# Patient Record
Sex: Female | Born: 1945
Health system: Southern US, Community
[De-identification: ages and names within clinical notes are randomized; demographics above are authoritative.]

## PROBLEM LIST (undated history)

## (undated) DIAGNOSIS — R002 Palpitations: Secondary | ICD-10-CM

## (undated) DIAGNOSIS — M1990 Unspecified osteoarthritis, unspecified site: Secondary | ICD-10-CM

## (undated) DIAGNOSIS — N289 Disorder of kidney and ureter, unspecified: Secondary | ICD-10-CM

## (undated) DIAGNOSIS — G2401 Drug induced subacute dyskinesia: Secondary | ICD-10-CM

## (undated) DIAGNOSIS — R0600 Dyspnea, unspecified: Secondary | ICD-10-CM

## (undated) DIAGNOSIS — F419 Anxiety disorder, unspecified: Secondary | ICD-10-CM

## (undated) DIAGNOSIS — F329 Major depressive disorder, single episode, unspecified: Secondary | ICD-10-CM

## (undated) DIAGNOSIS — F319 Bipolar disorder, unspecified: Secondary | ICD-10-CM

## (undated) DIAGNOSIS — J309 Allergic rhinitis, unspecified: Secondary | ICD-10-CM

## (undated) DIAGNOSIS — D649 Anemia, unspecified: Secondary | ICD-10-CM

## (undated) DIAGNOSIS — G4700 Insomnia, unspecified: Secondary | ICD-10-CM

## (undated) DIAGNOSIS — E669 Obesity, unspecified: Secondary | ICD-10-CM

## (undated) DIAGNOSIS — R251 Tremor, unspecified: Secondary | ICD-10-CM

## (undated) DIAGNOSIS — C801 Malignant (primary) neoplasm, unspecified: Secondary | ICD-10-CM

## (undated) DIAGNOSIS — R42 Dizziness and giddiness: Secondary | ICD-10-CM

## (undated) DIAGNOSIS — E785 Hyperlipidemia, unspecified: Secondary | ICD-10-CM

## (undated) DIAGNOSIS — K5730 Diverticulosis of large intestine without perforation or abscess without bleeding: Secondary | ICD-10-CM

## (undated) DIAGNOSIS — F32A Depression, unspecified: Secondary | ICD-10-CM

## (undated) DIAGNOSIS — I10 Essential (primary) hypertension: Secondary | ICD-10-CM

## (undated) DIAGNOSIS — H9192 Unspecified hearing loss, left ear: Secondary | ICD-10-CM

## (undated) HISTORY — DX: Anemia, unspecified: D64.9

## (undated) HISTORY — DX: Tremor, unspecified: R25.1

## (undated) HISTORY — DX: Diverticulosis of large intestine without perforation or abscess without bleeding: K57.30

## (undated) HISTORY — DX: Drug induced subacute dyskinesia: G24.01

## (undated) HISTORY — PX: TOTAL ABDOMINAL HYSTERECTOMY: SHX209

## (undated) HISTORY — DX: Insomnia, unspecified: G47.00

## (undated) HISTORY — PX: DILATION AND CURETTAGE OF UTERUS: SHX78

## (undated) SURGERY — Surgical Case
Anesthesia: *Unknown

---
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
## 1998-04-24 ENCOUNTER — Other Ambulatory Visit: Admission: RE | Admit: 1998-04-24 | Discharge: 1998-04-24 | Payer: Self-pay | Admitting: Obstetrics & Gynecology

## 2000-02-26 ENCOUNTER — Other Ambulatory Visit: Admission: RE | Admit: 2000-02-26 | Discharge: 2000-02-26 | Payer: Self-pay | Admitting: Obstetrics and Gynecology

## 2000-11-07 ENCOUNTER — Encounter: Payer: Self-pay | Admitting: Gynecology

## 2000-11-09 ENCOUNTER — Encounter (INDEPENDENT_AMBULATORY_CARE_PROVIDER_SITE_OTHER): Payer: Self-pay | Admitting: Specialist

## 2000-11-09 ENCOUNTER — Ambulatory Visit (HOSPITAL_COMMUNITY): Admission: RE | Admit: 2000-11-09 | Discharge: 2000-11-09 | Payer: Self-pay | Admitting: Gynecology

## 2001-08-14 ENCOUNTER — Encounter: Payer: Self-pay | Admitting: Emergency Medicine

## 2001-08-14 ENCOUNTER — Emergency Department (HOSPITAL_COMMUNITY): Admission: EM | Admit: 2001-08-14 | Discharge: 2001-08-14 | Payer: Self-pay | Admitting: Emergency Medicine

## 2001-09-18 ENCOUNTER — Encounter: Admission: RE | Admit: 2001-09-18 | Discharge: 2001-09-18 | Payer: Self-pay | Admitting: Internal Medicine

## 2001-09-18 ENCOUNTER — Encounter: Payer: Self-pay | Admitting: Internal Medicine

## 2001-09-21 ENCOUNTER — Encounter: Admission: RE | Admit: 2001-09-21 | Discharge: 2001-09-21 | Payer: Self-pay | Admitting: Internal Medicine

## 2001-09-21 ENCOUNTER — Encounter: Payer: Self-pay | Admitting: Internal Medicine

## 2001-09-27 ENCOUNTER — Encounter: Admission: RE | Admit: 2001-09-27 | Discharge: 2001-09-27 | Payer: Self-pay | Admitting: Internal Medicine

## 2001-09-27 ENCOUNTER — Encounter: Payer: Self-pay | Admitting: Internal Medicine

## 2002-12-11 ENCOUNTER — Encounter: Admission: RE | Admit: 2002-12-11 | Discharge: 2002-12-11 | Payer: Self-pay | Admitting: Internal Medicine

## 2002-12-11 ENCOUNTER — Encounter: Payer: Self-pay | Admitting: Internal Medicine

## 2006-09-11 ENCOUNTER — Emergency Department (HOSPITAL_COMMUNITY): Admission: EM | Admit: 2006-09-11 | Discharge: 2006-09-11 | Payer: Self-pay | Admitting: Emergency Medicine

## 2006-10-13 ENCOUNTER — Encounter: Admission: RE | Admit: 2006-10-13 | Discharge: 2006-11-10 | Payer: Self-pay | Admitting: Orthopedic Surgery

## 2007-04-27 LAB — HM DEXA SCAN

## 2010-10-08 ENCOUNTER — Ambulatory Visit: Payer: Self-pay | Admitting: Internal Medicine

## 2010-10-08 DIAGNOSIS — I129 Hypertensive chronic kidney disease with stage 1 through stage 4 chronic kidney disease, or unspecified chronic kidney disease: Secondary | ICD-10-CM | POA: Insufficient documentation

## 2010-10-08 DIAGNOSIS — I10 Essential (primary) hypertension: Secondary | ICD-10-CM | POA: Insufficient documentation

## 2010-10-08 DIAGNOSIS — E7800 Pure hypercholesterolemia, unspecified: Secondary | ICD-10-CM | POA: Insufficient documentation

## 2010-10-08 DIAGNOSIS — F329 Major depressive disorder, single episode, unspecified: Secondary | ICD-10-CM | POA: Insufficient documentation

## 2010-10-08 DIAGNOSIS — F32A Depression, unspecified: Secondary | ICD-10-CM | POA: Insufficient documentation

## 2010-10-08 DIAGNOSIS — J309 Allergic rhinitis, unspecified: Secondary | ICD-10-CM | POA: Insufficient documentation

## 2010-11-10 ENCOUNTER — Encounter (INDEPENDENT_AMBULATORY_CARE_PROVIDER_SITE_OTHER): Payer: Self-pay | Admitting: Internal Medicine

## 2010-11-10 ENCOUNTER — Ambulatory Visit: Admit: 2010-11-10 | Payer: Self-pay | Admitting: Internal Medicine

## 2010-11-10 ENCOUNTER — Encounter: Payer: Self-pay | Admitting: Internal Medicine

## 2010-11-10 LAB — CONVERTED CEMR LAB
BUN: 15 mg/dL (ref 6–23)
Basophils Absolute: 0 10*3/uL (ref 0.0–0.1)
Basophils Relative: 0 % (ref 0–1)
CO2: 30 meq/L (ref 19–32)
Creatinine, Ser: 0.87 mg/dL (ref 0.40–1.20)
Eosinophils Absolute: 0.1 10*3/uL (ref 0.0–0.7)
Eosinophils Relative: 1 % (ref 0–5)
HCT: 40.6 % (ref 36.0–46.0)
Hemoglobin: 13.6 g/dL (ref 12.0–15.0)
LDL Cholesterol: 143 mg/dL — ABNORMAL HIGH (ref 0–99)
Lymphs Abs: 2.2 10*3/uL (ref 0.7–4.0)
MCV: 87.5 fL (ref 78.0–100.0)
Monocytes Absolute: 0.6 10*3/uL (ref 0.1–1.0)
Potassium: 3.4 meq/L — ABNORMAL LOW (ref 3.5–5.3)
RBC: 4.64 M/uL (ref 3.87–5.11)
Sodium: 140 meq/L (ref 135–145)
Total CHOL/HDL Ratio: 3.8
Triglycerides: 81 mg/dL (ref <150)
WBC: 7.1 10*3/uL (ref 4.0–10.5)

## 2010-11-12 NOTE — Assessment & Plan Note (Signed)
Summary: NEW PATIENT TO ESTAB//KT   Vital Signs:  Patient profile:   65 year old female Height:      66 inches Weight:      226 pounds BMI:     36.61 Temp:     97.3 degrees F oral Pulse rate:   88 / minute Pulse rhythm:   regular Resp:     18 per minute BP sitting:   170 / 100  (left arm) Cuff size:   regular  Vitals Entered By: [place] Shannon (October 08, 2010 2:00 PM) CC: NP...[name] pt has been off of meds 6 months  pt says her nerves are shocked and she would like something to help her get through... Is Patient Diabetic? No Pain Assessment Patient in pain? no       Does patient need assistance? Functional Status Self care Ambulation Normal   CC:  NP...[name] pt has been off of meds 6 months  pt says her nerves are shocked and she would like something to help her get through....  History of Present Illness: 65 yo female here to establish.  Lost job about 1 year ago and has not been able to afford medication since.  1.  Hypertension:  Took Amlodipine 10 mg and HCTZ 12.5 mg.  BP was controlled on these meds and tolerated well per pt.  Htn diagnosed about 5-6 years ago.    2.  Seasonal Allergies:  does not need Loratadine currently, but this worked fine for her previously.  3.  Hypercholesterolemia:  Cannot recall what she took for this in past.  4.  Depression: was on medication for this.  Feels she is still depressed.  Became a problem when lost job and struggled to pay for things.  Little to no motivation.  Cries a lot.  No suicidal ideation.  Does look forward to watching grandchildren.  MOther of grandchildren has pancreatic cancer as well.    Allergies (verified): No Known Drug Allergies  Past History:  Past Medical History: ALLERGIC RHINITIS (ICD-477.9) HYPERCHOLESTEROLEMIA (ICD-272.0) HYPERTENSION (ICD-401.9) DEPRESSION (ICD-311)    Past Surgical History: 1.  2010:  Left breast biopsy--benign  Family History: Mother, died 59:  Kidney failure,  hypertension Father, died 25:  Unknown cancer--"around his heart"  was a smoker, but pt. not sure if lung cancer  2 Brothers:  1 died age 57:  hypertension complications.  1 living with Hypertension 1 Sister, 42:  Anemia 1 Son, 36:  Depression  Social History: Divorced Not sexually active. 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 Lungs are clear to auscultation, no crackles or wheezes. Heart:  Normal rate and regular rhythm. S1 and S2 normal without gallop, murmur, click, rub or other extra sounds.  Radial pulses normal and equal   Complete Medication List: 1)  Amlodipine Besylate 10 Mg Tabs (Amlodipine besylate) .[name].. 1 tab by mouth daily 2)  Hydrochlorothiazide 12.5 Mg Caps (Hydrochlorothiazide) .[name].. 1 cap by mouth daily in morning 3)  Fluoxetine Hcl 10 Mg Tabs (Fluoxetine hcl) .[name].. 1 tab by mouth daily  Patient Instructions: 1)  Follow up with Dr. Amil Amen in 1 month --depression and bp-come fasting for labs then as well, but take morning meds with water. Prescriptions: FLUOXETINE HCL 10 MG TABS (FLUOXETINE  HCL) 1 tab by mouth daily  #30 x 2   Entered and Authorized by:   Mack Hook MD   Signed by:   Mack Hook MD on 10/08/2010   Method used:   Electronically to        [facility] [phone]* (retail)       [place], [place]  [address]       Ph: [phone]       Fax: [phone]   RxID:   [id number] HYDROCHLOROTHIAZIDE 12.5 MG CAPS (HYDROCHLOROTHIAZIDE) 1 cap by mouth daily in morning  #30 x 11   Entered and Authorized by:   Mack Hook MD   Signed by:   Mack Hook MD on 10/08/2010   Method used:   Electronically to        [facility] [phone]* (retail)       [place], [place]  [address]       Ph: [phone]       Fax: [phone]   RxID:   [id number] AMLODIPINE BESYLATE 10 MG TABS (AMLODIPINE BESYLATE) 1 tab by mouth daily  #30 x 11   Entered and Authorized by:   Mack Hook MD   Signed by:   Mack Hook MD on 10/08/2010   Method used:   Electronically to        [facility] [phone]* (retail)       [address]       [place], [place]  [address]       Ph: [phone]       Fax: [phone]   RxID:   [id number]    Orders Added: 1)  New Patient Level III [id number]

## 2010-11-23 ENCOUNTER — Encounter (INDEPENDENT_AMBULATORY_CARE_PROVIDER_SITE_OTHER): Payer: Self-pay | Admitting: Internal Medicine

## 2010-11-24 ENCOUNTER — Encounter (INDEPENDENT_AMBULATORY_CARE_PROVIDER_SITE_OTHER): Payer: Self-pay | Admitting: Internal Medicine

## 2010-11-24 ENCOUNTER — Encounter: Payer: Self-pay | Admitting: Internal Medicine

## 2010-12-02 NOTE — Assessment & Plan Note (Signed)
Summary: 1 month f/u for depression/fasting   Vital Signs:  Patient profile:   65 year old female Menstrual status:  postmenopausal Weight:      219.25 pounds Temp:     97.7 degrees F oral Pulse rate:   76 / minute Pulse rhythm:   regular Resp:     16 per minute BP sitting:   154 / 90  (left arm) Cuff size:   regular  Vitals Entered By: Aquilla Solian CMA (November 10, 2010 3:05 PM) CC: here for a f/u on depression and fasting labs. Pt. is fasting.  Is Patient Diabetic? No Pain Assessment Patient in pain? no       Does patient need assistance? Functional Status Self care Ambulation Normal     Menstrual Status postmenopausal   CC:  here for a f/u on depression and fasting labs. Pt. is fasting. [name]  History of Present Illness: 1.  Hypertension:  Taking Amlodipine and HCTZ regularly every morning.  Feels much better--lost 7 lbs, she feels was all water weight.  Is exercising once weekly.    2.  Depression:  Still not motivated to do housework at times.  Not crying like she was.  Sleeping fine.  Feels rested in morning when arises.  3.  Hypercholesterolemia:  fasting today  4.  Allergies:  asymptomatic.  Current Medications (verified): 1)  Amlodipine Besylate 10 Mg Tabs (Amlodipine Besylate) .[name].. 1 Tab By Mouth Daily 2)  Hydrochlorothiazide 12.5 Mg Caps (Hydrochlorothiazide) .[name].. 1 Cap By Mouth Daily in Morning 3)  Fluoxetine Hcl 10 Mg Tabs (Fluoxetine Hcl) .[name].. 1 Tab By Mouth Daily  Allergies (verified): No Known Drug Allergies  Physical Exam  General:  NAD, smiling at times Lungs:  Normal respiratory effort, chest expands symmetrically. Lungs are clear to auscultation, no crackles or wheezes. Heart:  Normal rate and regular rhythm. S1 and S2 normal without gallop, murmur, click, rub or other extra sounds.  Radial pulses normal and equal Extremities:  No edema   Impression & Recommendations:  Problem # 1:  HYPERTENSION (ICD-401.9) Improved, but not at  goal--increase to 25 mg of HCTZ Her updated medication list for this problem includes:    Amlodipine Besylate 10 Mg Tabs (Amlodipine besylate) .[name]... 1 tab by mouth daily    Hydrochlorothiazide 25 Mg Tabs (Hydrochlorothiazide) .[name]... 1 tab by mouth daily for blood pressure  Orders: T-CBC w/Diff [id number]) Urinalysis-dipstick only (Medicare patient) [id number])  Problem # 2:  HYPERCHOLESTEROLEMIA (ICD-272.0)  Orders: T-Lipid Profile [id number])  Problem # 3:  DEPRESSION (ICD-311) Improved, but not where she would like to be yet--give it 2 more weeks, if no improvement--increase to 20 mg Her updated medication list for this problem includes:    Fluoxetine Hcl 10 Mg Tabs (Fluoxetine hcl) .[name]... 1 tab by mouth daily  Complete Medication List: 1)  Amlodipine Besylate 10 Mg Tabs (Amlodipine besylate) .[name].. 1 tab by mouth daily 2)  Hydrochlorothiazide 25 Mg Tabs (Hydrochlorothiazide) .[name].. 1 tab by mouth daily for blood pressure 3)  Fluoxetine Hcl 10 Mg Tabs (Fluoxetine hcl) .[name].. 1 tab by mouth daily  Other Orders: T-Comprehensive Metabolic Panel ([id number])  Patient Instructions: 1)  nurse visit for bp check in 2 weeks--see how she is doing with depression then--if still not at goal, will increase to 20 mg of Fluoxetine 2)  Follow up with Dr. Amil Amen in 4 months --htn, depression Prescriptions: FLUOXETINE HCL 10 MG TABS (FLUOXETINE HCL) 1 tab by mouth daily  #30 x 4   Entered and Authorized  by:   Mack Hook MD   Signed by:   Mack Hook MD on 11/10/2010   Method used:   Electronically to        [facility] [phone]* (retail)       [place], [place]  [address]       Ph: [phone]       Fax: [phone]   RxID:   [id number] HYDROCHLOROTHIAZIDE 25 MG TABS (HYDROCHLOROTHIAZIDE) 1 tab by mouth daily for blood pressure  #30 x 11   Entered and Authorized by:   Mack Hook MD   Signed by:   Mack Hook MD on 11/10/2010    Method used:   Electronically to        [facility] [phone]* (retail)       [address]       [place], [place]  [address]       Ph: [phone]       Fax: [phone]   RxID:   [id number]    Orders Added: 1)  T-Lipid Profile [id number] 2)  T-Comprehensive Metabolic Panel [id number] 3)  T-CBC w/Diff [id number] 4)  Urinalysis-dipstick only (Medicare patient) [id number] 5)  Est. Patient Level III [id number]   Not Administered:    Influenza Vaccine not given due to: declined

## 2010-12-02 NOTE — Letter (Signed)
Summary: Lipid Letter  [facility]  [address] [place], [place] [address]   Phone: [phone]  Fax: [phone]    11/23/2010  Judy [name] [address] [place], [place]  [address]  Dear Ms. Colla:  We have carefully reviewed your last lipid profile from 11/10/2010 and the results are noted below with a summary of recommendations for lipid management.    Cholesterol:       216     Goal: <200   HDL "good" Cholesterol:   57     Goal: >45   LDL "bad" Cholesterol:   143     Goal: <100   Triglycerides:       81     Goal: <150    Your total cholesterol and LDL or "bad" cholesterol are a bit high.  If you can increase your vegetable intake (3-5 servings daily) and exercise more, that will help.  Your blood sugar was slightly high as well, so would encourage you to decrease the sugar/carbohydrates in your diet as well as above.  We should recheck your sugar and cholesterol sometime in next 6 months.  If you would like to meet with a nutritionist to get more information on how to change your diet, let us know and we will set up.    TLC Diet (Therapeutic Lifestyle Change): Saturated Fats & Transfatty acids should be kept < 7% of total calories ***Reduce Saturated Fats Polyunstaurated Fat can be up to 10% of total calories Monounsaturated Fat Fat can be up to 20% of total calories Total Fat should be no greater than 25-35% of total calories Carbohydrates should be 50-60% of total calories Protein should be approximately 15% of total calories Fiber should be at least 20-30 grams a day ***Increased fiber may help lower LDL Total Cholesterol should be < 200mg /day Consider adding plant stanol/sterols to diet (example: Benacol spread) ***A higher intake of unsaturated fat may reduce Triglycerides and Increase HDL    Adjunctive Measures (may lower LIPIDS and reduce risk of Heart Attack) include: Aerobic Exercise (20-30 minutes 3-4 times a week) Limit  Alcohol Consumption Weight Reduction Aspirin 75-81 mg a day by mouth (if not allergic or contraindicated) Dietary Fiber 20-30 grams a day by mouth     Current Medications: 1)    Amlodipine Besylate 10 Mg Tabs (Amlodipine besylate) .[name].. 1 tab by mouth daily 2)    Hydrochlorothiazide 25 Mg Tabs (Hydrochlorothiazide) .[name].. 1 tab by mouth daily for blood pressure 3)    Fluoxetine Hcl 10 Mg Tabs (Fluoxetine hcl) .[name].. 1 tab by mouth daily  If you have any questions, please call. We appreciate being able to work with you.   Sincerely,    [facility]

## 2010-12-02 NOTE — Assessment & Plan Note (Signed)
Summary: BP recheck and med assessment  Nurse Visit   Vital Signs:  Patient profile:   65 year old female Menstrual status:  postmenopausal Weight:      219.7 pounds Temp:     96.9 degrees F oral Pulse rate:   64 / minute Pulse rhythm:   regular Resp:     16 per minute BP sitting:   120 / 74  (left arm) Cuff size:   regular  Vitals Entered By: Sherian Maroon RN (November 24, 2010 2:41 PM) CC: BP recheck and assess effectiveness of meds Is Patient Diabetic? No Pain Assessment Patient in pain? no       Does patient need assistance? Functional Status Self care Ambulation Normal   CC:  BP recheck and assess effectiveness of meds.  History of Present Illness: 11/10/10  BP 154/90  P76.  BP was better but not at goal.  Increased HCTZ.  Here to recheck BP and assess effectiveness of fluoxetine for depression.  Taking medications daily, no missed doses.     Patient Instructions: 1)  Reviewed with Dr. Amil Amen 2)  Your blood pressure is greatly improved. 3)  Continue medications for blood pressure as ordered. 4)  Take fluoxetine 20 mg. one tab by mouth daily. 5)  Call to schedule follow-up appointment with provider for two months.  We will be in the [employer] office at your next visit. 6)  Call if anything changes or if you have any questions.   Review of Systems Psych:  Still having a struggle completing ADLs, housework, some difficulty getting out of the house.  Says "she's getting there" but not where she'd like to be.  Denies any symptoms of anxiety, anger, irritability.  Denies thoughts of suicide or violence.  No episodes of tearfulness or panic attacks..   Impression & Recommendations:  Problem # 1:  HYPERTENSION (ICD-401.9) BP good Continue meds as ordered F/u with provider in two months   Her updated medication list for this problem includes:    Amlodipine Besylate 10 Mg Tabs (Amlodipine besylate) .[name]... 1 tab by mouth daily    Hydrochlorothiazide 25 Mg Tabs  (Hydrochlorothiazide) .[name]... 1 tab by mouth daily for blood pressure  Problem # 2:  DEPRESSION (ICD-311) Doing better, still having trouble with daily activities, getting out Fluoxetine increased F/u with provider in two months  Her updated medication list for this problem includes:    Fluoxetine Hcl 20 Mg Tabs (Fluoxetine hcl) .[name]... Take one tab by mouth once daily for mood  Complete Medication List: 1)  Amlodipine Besylate 10 Mg Tabs (Amlodipine besylate) .[name].. 1 tab by mouth daily 2)  Hydrochlorothiazide 25 Mg Tabs (Hydrochlorothiazide) .[name].. 1 tab by mouth daily for blood pressure 3)  Fluoxetine Hcl 20 Mg Tabs (Fluoxetine hcl) .... Take one tab by mouth once daily for mood   Physical Exam  General:  alert, well-developed, well-nourished, and well-hydrated.   Psych:  Oriented X3, good eye contact, and not anxious appearing.  Quiet demeanor, not tearful.   Allergies: No Known Drug Allergies  Orders Added: 1)  Est. Patient Level II [id number]

## 2010-12-21 ENCOUNTER — Telehealth (INDEPENDENT_AMBULATORY_CARE_PROVIDER_SITE_OTHER): Payer: Self-pay | Admitting: Internal Medicine

## 2010-12-29 NOTE — Progress Notes (Signed)
Summary: Dental  needs  Phone Note Call from Patient Call back at [phone]   Summary of Call: cell phone home # no longer avail. Pt needs dental assist/ds Initial call taken by: Elgie Collard,  December 21, 2010 4:11 PM  Follow-up for Phone Call        Home # disconnected.  Left message on voicemail for pt. to return call.  Sherian Maroon RN  December 22, 2010 5:15 PM  Left message on voicemail for pt. to return call.  Sherian Maroon RN  December 23, 2010 1:07 PM  Wanted to get a referral for routine dental care.  Advised that currently only active emergent/urgent care is being referred, that she will have to locate an outside provider.  Verbalized understanding and agreement. Follow-up by: Sherian Maroon RN,  December 23, 2010 2:21 PM

## 2011-02-26 NOTE — Op Note (Signed)
[facility]  Patient:    Judy [name], Judy [name]                      MRN: [id number] Proc. Date: 11/09/00 Adm. Date:  [id number] Disc. Date: [id number] Attending:  Worthy Flank                           Operative Report  REDICTATION:  Originally dictated on November 09, 2000.  PREOPERATIVE DIAGNOSES: 1. Postmenopausal bleeding. 2. Cervical stenosis.  POSTOPERATIVE DIAGNOSES: 1. Postmenopausal bleeding. 2. Cervical stenosis. 3. Uterine fibroids.  OPERATION:  Dilatation and curettage.  SURGEON:  Barbaraann Rondo, M.D.  DESCRIPTION OF PROCEDURE:  Under good anesthesia, the patient was prepped and draped in a sterile manner.  Then revealed the uterus anterior slightly enlarged and irregular.  No masses on the adnexa were palpated.  Cervix was smooth and had a pinpoint os.  The os was probed with a small hemostat and dilated by opening the hemostat.  It was carried on up through the canal until a small opening in the canal could be obtained.  Some old blood drained through the canal once this was done.  The uterus was then sounded to a depth of 4 inches.  Cervix was then dilated up to about 26-French, and then cervical curettage was done, and this sent as a separate specimen.  Endometrial curettage was done, and a small to moderate amount of tissue was obtained. The endometrial cavity was somewhat irregular.  The endometrial cavity was wiped with a dry sponge and was clean.  The procedure was then terminated. She was removed to recovery in good condition.  Tissue was sent to pathology. Estimated blood loss was 25 cc; none was replaced. DD:  11/22/00 TD:  11/22/00 Job: [id number]

## 2011-02-26 NOTE — Op Note (Signed)
[facility]  Patient:    Judy [name], Judy [name]                      MRN: [id number] Proc. Date: 11/09/00 Adm. Date:  [id number] Attending:  Worthy Flank                           Operative Report  PREOPERATIVE DIAGNOSES: 1. Postmenopausal bleeding. 2. Cervical stenosis.  POSTOPERATIVE DIAGNOSES: 1. Postmenopausal bleeding. 2. Cervical stenosis.  OPERATION:  D&C.  SURGEON:  Barbaraann Rondo, M.D.  DESCRIPTION OF PROCEDURE:  The patient was placed on the operating table in the modified lithotomy position.  She was sedated.  A speculum was placed in the vagina after examination showed the uterus to be retroflexed and felt a little bit enlarged.  The patient is a little obese and it was tough to outline the uterus.  No adnexal masses could be outlined.  The cervix was grasped with the tenaculum and a paracervical block was performed using 10 cc of 1% lidocaine at the 8 and 4 oclock positions.  Once this was achieved, the canal was probed with a fine dilator and found to be opened carefully.  She was dilated up and then the regular cervical dilators could be used to continue the dilatation up to about a 29-French. The cavity sounded to a depth of almost 5 inches.  The endocervix was curetted and this was sent as a separate specimen.  The cavity was explored for polyp forceps and no polyps were obtained.  The endometrium was scraped with a sharp curet and only a small amount of tissue was obtained.  There was a definite feeling of irregularity on the anterior wall of the uterine cavity.  This was thought to be due to fibroid.  The endometrial cavity was wiped with a dry sponge and the procedure then terminated.  She was removed to recovery in good condition. DD:  11/09/00 TD:  11/09/00 Job: [id number]

## 2011-04-25 ENCOUNTER — Emergency Department (HOSPITAL_COMMUNITY)
Admission: EM | Admit: 2011-04-25 | Discharge: 2011-04-25 | Disposition: A | Discharge status: Home / Self Care | Payer: Self-pay | Attending: Emergency Medicine | Admitting: Emergency Medicine

## 2011-04-25 ENCOUNTER — Emergency Department (HOSPITAL_COMMUNITY): Payer: Self-pay

## 2011-04-25 DIAGNOSIS — E7800 Pure hypercholesterolemia, unspecified: Secondary | ICD-10-CM | POA: Insufficient documentation

## 2011-04-25 DIAGNOSIS — I498 Other specified cardiac arrhythmias: Secondary | ICD-10-CM | POA: Insufficient documentation

## 2011-04-25 DIAGNOSIS — G9389 Other specified disorders of brain: Secondary | ICD-10-CM | POA: Insufficient documentation

## 2011-04-25 DIAGNOSIS — R110 Nausea: Secondary | ICD-10-CM | POA: Insufficient documentation

## 2011-04-25 DIAGNOSIS — H81399 Other peripheral vertigo, unspecified ear: Secondary | ICD-10-CM | POA: Insufficient documentation

## 2011-04-25 DIAGNOSIS — I10 Essential (primary) hypertension: Secondary | ICD-10-CM | POA: Insufficient documentation

## 2011-04-25 LAB — DIFFERENTIAL
Basophils Absolute: 0 10*3/uL (ref 0.0–0.1)
Basophils Relative: 0 % (ref 0–1)
Eosinophils Absolute: 0.2 10*3/uL (ref 0.0–0.7)
Lymphocytes Relative: 30 % (ref 12–46)
Lymphs Abs: 2.1 10*3/uL (ref 0.7–4.0)
Neutrophils Relative %: 58 % (ref 43–77)

## 2011-04-25 LAB — CBC
HCT: 35.9 % — ABNORMAL LOW (ref 36.0–46.0)
MCH: 29.8 pg (ref 26.0–34.0)
MCHC: 34 g/dL (ref 30.0–36.0)
MCV: 87.6 fL (ref 78.0–100.0)
Platelets: 258 10*3/uL (ref 150–400)
RDW: 14.2 % (ref 11.5–15.5)

## 2011-04-25 LAB — POCT I-STAT, CHEM 8
BUN: 24 mg/dL — ABNORMAL HIGH (ref 6–23)
Chloride: 104 mEq/L (ref 96–112)
Creatinine, Ser: 0.9 mg/dL (ref 0.50–1.10)
Glucose, Bld: 93 mg/dL (ref 70–99)
Hemoglobin: 12.9 g/dL (ref 12.0–15.0)
TCO2: 27 mmol/L (ref 0–100)

## 2011-08-24 ENCOUNTER — Encounter (HOSPITAL_COMMUNITY): Payer: Self-pay | Admitting: Gastroenterology

## 2011-08-25 ENCOUNTER — Encounter (HOSPITAL_COMMUNITY): Payer: Self-pay | Admitting: Gastroenterology

## 2011-08-25 ENCOUNTER — Ambulatory Visit (HOSPITAL_COMMUNITY)
Admission: RE | Admit: 2011-08-25 | Discharge: 2011-08-25 | Disposition: A | Discharge status: Home / Self Care | Payer: Self-pay | Source: Ambulatory Visit | Attending: Gastroenterology | Admitting: Gastroenterology

## 2011-08-25 ENCOUNTER — Encounter (HOSPITAL_COMMUNITY)
Admission: RE | Disposition: A | Discharge status: Home / Self Care | Payer: Self-pay | Source: Ambulatory Visit | Attending: Gastroenterology

## 2011-08-25 DIAGNOSIS — I10 Essential (primary) hypertension: Secondary | ICD-10-CM | POA: Insufficient documentation

## 2011-08-25 DIAGNOSIS — Z79899 Other long term (current) drug therapy: Secondary | ICD-10-CM | POA: Insufficient documentation

## 2011-08-25 DIAGNOSIS — Z1211 Encounter for screening for malignant neoplasm of colon: Secondary | ICD-10-CM | POA: Insufficient documentation

## 2011-08-25 DIAGNOSIS — Z800 Family history of malignant neoplasm of digestive organs: Secondary | ICD-10-CM | POA: Insufficient documentation

## 2011-08-25 DIAGNOSIS — K5730 Diverticulosis of large intestine without perforation or abscess without bleeding: Secondary | ICD-10-CM | POA: Insufficient documentation

## 2011-08-25 DIAGNOSIS — Z7982 Long term (current) use of aspirin: Secondary | ICD-10-CM | POA: Insufficient documentation

## 2011-08-25 HISTORY — DX: Unspecified osteoarthritis, unspecified site: M19.90

## 2011-08-25 HISTORY — DX: Diverticulosis of large intestine without perforation or abscess without bleeding: K57.30

## 2011-08-25 HISTORY — PX: COLONOSCOPY: SHX5424

## 2011-08-25 HISTORY — DX: Essential (primary) hypertension: I10

## 2011-08-25 HISTORY — DX: Hyperlipidemia, unspecified: E78.5

## 2011-08-25 HISTORY — DX: Dizziness and giddiness: R42

## 2011-08-25 SURGERY — COLONOSCOPY
Anesthesia: Moderate Sedation

## 2011-08-25 MED ORDER — MIDAZOLAM HCL 10 MG/2ML IJ SOLN
INTRAMUSCULAR | Status: DC | PRN
  Administered 2011-08-25 (×3): 2 mg via INTRAVENOUS

## 2011-08-25 MED ORDER — FENTANYL CITRATE 0.05 MG/ML IJ SOLN
INTRAMUSCULAR | Status: AC
  Filled 2011-08-25: qty 4

## 2011-08-25 MED ORDER — FENTANYL CITRATE 0.05 MG/ML IJ SOLN
INTRAMUSCULAR | Status: DC | PRN
  Administered 2011-08-25 (×4): 25 ug via INTRAVENOUS

## 2011-08-25 MED ORDER — MIDAZOLAM HCL 10 MG/2ML IJ SOLN
INTRAMUSCULAR | Status: AC
  Filled 2011-08-25: qty 4

## 2011-08-25 MED ORDER — SODIUM CHLORIDE 0.9 % IV SOLN
INTRAVENOUS | Status: DC
  Administered 2011-08-25: 500 mL via INTRAVENOUS
  Administered 2011-08-25: 08:00:00 via INTRAVENOUS

## 2011-08-25 MED ORDER — DIPHENHYDRAMINE HCL 50 MG/ML IJ SOLN
INTRAMUSCULAR | Status: AC
  Filled 2011-08-25: qty 1

## 2011-08-25 NOTE — H&P (Signed)
Patient interval history reviewed.  Patient examined again.  There has been no change from documented H/P dated 08/09/2011 (scanned into chart from our office) except as documented above

## 2011-08-25 NOTE — Brief Op Note (Signed)
  Please see [facility] note dated 08/25/2011.

## 2011-08-26 ENCOUNTER — Encounter (HOSPITAL_COMMUNITY): Payer: Self-pay

## 2011-09-06 ENCOUNTER — Encounter (HOSPITAL_COMMUNITY): Payer: Self-pay | Admitting: Gastroenterology

## 2013-04-05 ENCOUNTER — Encounter: Payer: Self-pay | Admitting: Internal Medicine

## 2013-04-06 ENCOUNTER — Encounter: Payer: Self-pay | Admitting: Family Medicine

## 2013-12-10 ENCOUNTER — Emergency Department (HOSPITAL_COMMUNITY): Payer: Medicare Other

## 2013-12-10 ENCOUNTER — Inpatient Hospital Stay (HOSPITAL_COMMUNITY)
Admission: EM | Admit: 2013-12-10 | Discharge: 2013-12-14 | DRG: 312 | Disposition: A | Discharge status: Skilled Nursing Facility | Payer: Medicare Other | Attending: Internal Medicine | Admitting: Internal Medicine

## 2013-12-10 ENCOUNTER — Encounter (HOSPITAL_COMMUNITY): Payer: Self-pay | Admitting: Emergency Medicine

## 2013-12-10 DIAGNOSIS — F329 Major depressive disorder, single episode, unspecified: Secondary | ICD-10-CM

## 2013-12-10 DIAGNOSIS — Z6832 Body mass index (BMI) 32.0-32.9, adult: Secondary | ICD-10-CM

## 2013-12-10 DIAGNOSIS — H9192 Unspecified hearing loss, left ear: Secondary | ICD-10-CM | POA: Insufficient documentation

## 2013-12-10 DIAGNOSIS — R5381 Other malaise: Secondary | ICD-10-CM | POA: Diagnosis present

## 2013-12-10 DIAGNOSIS — H9190 Unspecified hearing loss, unspecified ear: Secondary | ICD-10-CM | POA: Diagnosis present

## 2013-12-10 DIAGNOSIS — F32A Depression, unspecified: Secondary | ICD-10-CM | POA: Diagnosis present

## 2013-12-10 DIAGNOSIS — R42 Dizziness and giddiness: Secondary | ICD-10-CM | POA: Diagnosis present

## 2013-12-10 DIAGNOSIS — F411 Generalized anxiety disorder: Secondary | ICD-10-CM | POA: Diagnosis present

## 2013-12-10 DIAGNOSIS — F3289 Other specified depressive episodes: Secondary | ICD-10-CM

## 2013-12-10 DIAGNOSIS — E669 Obesity, unspecified: Secondary | ICD-10-CM | POA: Diagnosis present

## 2013-12-10 DIAGNOSIS — S0003XA Contusion of scalp, initial encounter: Secondary | ICD-10-CM | POA: Diagnosis present

## 2013-12-10 DIAGNOSIS — S0083XA Contusion of other part of head, initial encounter: Secondary | ICD-10-CM

## 2013-12-10 DIAGNOSIS — R1310 Dysphagia, unspecified: Secondary | ICD-10-CM | POA: Diagnosis present

## 2013-12-10 DIAGNOSIS — S1093XA Contusion of unspecified part of neck, initial encounter: Secondary | ICD-10-CM

## 2013-12-10 DIAGNOSIS — I519 Heart disease, unspecified: Secondary | ICD-10-CM | POA: Diagnosis present

## 2013-12-10 DIAGNOSIS — I129 Hypertensive chronic kidney disease with stage 1 through stage 4 chronic kidney disease, or unspecified chronic kidney disease: Secondary | ICD-10-CM | POA: Diagnosis present

## 2013-12-10 DIAGNOSIS — R5383 Other fatigue: Secondary | ICD-10-CM

## 2013-12-10 DIAGNOSIS — E785 Hyperlipidemia, unspecified: Secondary | ICD-10-CM | POA: Diagnosis present

## 2013-12-10 DIAGNOSIS — D72829 Elevated white blood cell count, unspecified: Secondary | ICD-10-CM

## 2013-12-10 DIAGNOSIS — J309 Allergic rhinitis, unspecified: Secondary | ICD-10-CM

## 2013-12-10 DIAGNOSIS — W010XXA Fall on same level from slipping, tripping and stumbling without subsequent striking against object, initial encounter: Secondary | ICD-10-CM | POA: Diagnosis present

## 2013-12-10 DIAGNOSIS — I951 Orthostatic hypotension: Principal | ICD-10-CM | POA: Diagnosis present

## 2013-12-10 DIAGNOSIS — J069 Acute upper respiratory infection, unspecified: Secondary | ICD-10-CM

## 2013-12-10 DIAGNOSIS — I10 Essential (primary) hypertension: Secondary | ICD-10-CM | POA: Diagnosis present

## 2013-12-10 DIAGNOSIS — F319 Bipolar disorder, unspecified: Secondary | ICD-10-CM | POA: Diagnosis present

## 2013-12-10 DIAGNOSIS — R55 Syncope and collapse: Secondary | ICD-10-CM | POA: Diagnosis present

## 2013-12-10 DIAGNOSIS — E876 Hypokalemia: Secondary | ICD-10-CM | POA: Diagnosis present

## 2013-12-10 DIAGNOSIS — Y92009 Unspecified place in unspecified non-institutional (private) residence as the place of occurrence of the external cause: Secondary | ICD-10-CM

## 2013-12-10 DIAGNOSIS — E7800 Pure hypercholesterolemia, unspecified: Secondary | ICD-10-CM | POA: Diagnosis present

## 2013-12-10 DIAGNOSIS — M25569 Pain in unspecified knee: Secondary | ICD-10-CM | POA: Diagnosis present

## 2013-12-10 DIAGNOSIS — E860 Dehydration: Secondary | ICD-10-CM | POA: Diagnosis present

## 2013-12-10 DIAGNOSIS — N390 Urinary tract infection, site not specified: Secondary | ICD-10-CM | POA: Diagnosis present

## 2013-12-10 DIAGNOSIS — H5509 Other forms of nystagmus: Secondary | ICD-10-CM | POA: Diagnosis present

## 2013-12-10 HISTORY — DX: Major depressive disorder, single episode, unspecified: F32.9

## 2013-12-10 HISTORY — DX: Bipolar disorder, unspecified: F31.9

## 2013-12-10 HISTORY — DX: Anxiety disorder, unspecified: F41.9

## 2013-12-10 HISTORY — DX: Allergic rhinitis, unspecified: J30.9

## 2013-12-10 HISTORY — DX: Unspecified hearing loss, left ear: H91.92

## 2013-12-10 HISTORY — DX: Obesity, unspecified: E66.9

## 2013-12-10 HISTORY — DX: Depression, unspecified: F32.A

## 2013-12-10 LAB — TROPONIN I

## 2013-12-10 LAB — COMPREHENSIVE METABOLIC PANEL
ALK PHOS: 84 U/L (ref 39–117)
ALT: 14 U/L (ref 0–35)
AST: 20 U/L (ref 0–37)
Albumin: 4 g/dL (ref 3.5–5.2)
BUN: 10 mg/dL (ref 6–23)
CALCIUM: 10.1 mg/dL (ref 8.4–10.5)
CO2: 28 mEq/L (ref 19–32)
Chloride: 98 mEq/L (ref 96–112)
Creatinine, Ser: 0.98 mg/dL (ref 0.50–1.10)
GFR calc non Af Amer: 59 mL/min — ABNORMAL LOW (ref >90)
GFR, EST AFRICAN AMERICAN: 68 mL/min — AB (ref >90)
GLUCOSE: 111 mg/dL — AB (ref 70–99)
Potassium: 3.8 mEq/L (ref 3.7–5.3)
Sodium: 139 mEq/L (ref 137–147)
TOTAL PROTEIN: 7.7 g/dL (ref 6.0–8.3)
Total Bilirubin: 0.4 mg/dL (ref 0.3–1.2)

## 2013-12-10 LAB — URINE MICROSCOPIC-ADD ON

## 2013-12-10 LAB — URINALYSIS, ROUTINE W REFLEX MICROSCOPIC
BILIRUBIN URINE: NEGATIVE
Glucose, UA: NEGATIVE mg/dL
Hgb urine dipstick: NEGATIVE
Ketones, ur: 40 mg/dL — AB
NITRITE: NEGATIVE
PH: 5.5 (ref 5.0–8.0)
Protein, ur: NEGATIVE mg/dL
SPECIFIC GRAVITY, URINE: 1.014 (ref 1.005–1.030)
Urobilinogen, UA: 1 mg/dL (ref 0.0–1.0)

## 2013-12-10 LAB — CBC WITH DIFFERENTIAL/PLATELET
Basophils Absolute: 0 10*3/uL (ref 0.0–0.1)
Basophils Relative: 0 % (ref 0–1)
EOS ABS: 0 10*3/uL (ref 0.0–0.7)
EOS PCT: 0 % (ref 0–5)
HCT: 41.2 % (ref 36.0–46.0)
HEMOGLOBIN: 14 g/dL (ref 12.0–15.0)
LYMPHS ABS: 0.9 10*3/uL (ref 0.7–4.0)
Lymphocytes Relative: 8 % — ABNORMAL LOW (ref 12–46)
MCH: 30 pg (ref 26.0–34.0)
MCHC: 34 g/dL (ref 30.0–36.0)
MCV: 88.4 fL (ref 78.0–100.0)
MONOS PCT: 5 % (ref 3–12)
Monocytes Absolute: 0.6 10*3/uL (ref 0.1–1.0)
Neutro Abs: 9.3 10*3/uL — ABNORMAL HIGH (ref 1.7–7.7)
Neutrophils Relative %: 86 % — ABNORMAL HIGH (ref 43–77)
Platelets: 254 10*3/uL (ref 150–400)
RBC: 4.66 MIL/uL (ref 3.87–5.11)
RDW: 14.3 % (ref 11.5–15.5)
WBC: 10.8 10*3/uL — ABNORMAL HIGH (ref 4.0–10.5)

## 2013-12-10 LAB — I-STAT TROPONIN, ED: TROPONIN I, POC: 0 ng/mL (ref 0.00–0.08)

## 2013-12-10 MED ORDER — ACETAMINOPHEN 650 MG RE SUPP: 650.0000 mg | Freq: Four times a day (QID) | RECTAL | Status: DC | PRN

## 2013-12-10 MED ORDER — DOCUSATE SODIUM 100 MG PO CAPS
100.0000 mg | ORAL_CAPSULE | Freq: Two times a day (BID) | ORAL | Status: DC
  Administered 2013-12-10 – 2013-12-14 (×8): 100 mg via ORAL
  Filled 2013-12-10 (×9): qty 1

## 2013-12-10 MED ORDER — INFLUENZA VAC SPLIT QUAD 0.5 ML IM SUSP
0.5000 mL | INTRAMUSCULAR | Status: AC
  Administered 2013-12-11: 0.5 mL via INTRAMUSCULAR
  Filled 2013-12-10 (×2): qty 0.5

## 2013-12-10 MED ORDER — RISPERIDONE 0.5 MG PO TABS
0.5000 mg | ORAL_TABLET | Freq: Every day | ORAL | Status: DC
  Administered 2013-12-10 – 2013-12-13 (×4): 0.5 mg via ORAL
  Filled 2013-12-10 (×5): qty 1

## 2013-12-10 MED ORDER — ACETAMINOPHEN 325 MG PO TABS: 650.0000 mg | ORAL_TABLET | Freq: Four times a day (QID) | ORAL | Status: DC | PRN

## 2013-12-10 MED ORDER — DIAZEPAM 5 MG PO TABS
5.0000 mg | ORAL_TABLET | Freq: Once | ORAL | Status: AC
  Administered 2013-12-10: 5 mg via ORAL
  Filled 2013-12-10: qty 1

## 2013-12-10 MED ORDER — FLUOXETINE HCL 20 MG PO TABS
40.0000 mg | ORAL_TABLET | Freq: Every day | ORAL | Status: DC
  Administered 2013-12-10 – 2013-12-14 (×5): 40 mg via ORAL
  Filled 2013-12-10 (×5): qty 2

## 2013-12-10 MED ORDER — ONDANSETRON HCL 4 MG/2ML IJ SOLN: 4.0000 mg | Freq: Four times a day (QID) | INTRAMUSCULAR | Status: DC | PRN

## 2013-12-10 MED ORDER — SODIUM CHLORIDE 0.9 % IV SOLN
INTRAVENOUS | Status: DC
  Administered 2013-12-11 – 2013-12-12 (×2): via INTRAVENOUS

## 2013-12-10 MED ORDER — SENNA 8.6 MG PO TABS
1.0000 | ORAL_TABLET | Freq: Two times a day (BID) | ORAL | Status: DC
  Administered 2013-12-10 – 2013-12-14 (×8): 8.6 mg via ORAL
  Filled 2013-12-10 (×8): qty 1

## 2013-12-10 MED ORDER — LOSARTAN POTASSIUM 50 MG PO TABS
100.0000 mg | ORAL_TABLET | Freq: Every day | ORAL | Status: DC
  Administered 2013-12-11 – 2013-12-14 (×4): 100 mg via ORAL
  Filled 2013-12-10 (×4): qty 2

## 2013-12-10 MED ORDER — ONDANSETRON HCL 4 MG/2ML IJ SOLN: 4.0000 mg | Freq: Three times a day (TID) | INTRAMUSCULAR | Status: DC | PRN

## 2013-12-10 MED ORDER — ENOXAPARIN SODIUM 40 MG/0.4ML ~~LOC~~ SOLN
40.0000 mg | SUBCUTANEOUS | Status: DC
  Administered 2013-12-10 – 2013-12-13 (×4): 40 mg via SUBCUTANEOUS
  Filled 2013-12-10 (×5): qty 0.4

## 2013-12-10 MED ORDER — MECLIZINE HCL 12.5 MG PO TABS
12.5000 mg | ORAL_TABLET | Freq: Three times a day (TID) | ORAL | Status: DC | PRN
  Administered 2013-12-11: 12.5 mg via ORAL
  Filled 2013-12-10: qty 1

## 2013-12-10 MED ORDER — SODIUM CHLORIDE 0.9 % IJ SOLN
3.0000 mL | Freq: Two times a day (BID) | INTRAMUSCULAR | Status: DC
  Administered 2013-12-12 – 2013-12-14 (×4): 3 mL via INTRAVENOUS

## 2013-12-10 MED ORDER — SODIUM CHLORIDE 0.9 % IV SOLN
INTRAVENOUS | Status: AC
  Administered 2013-12-10 – 2013-12-11 (×2): via INTRAVENOUS

## 2013-12-10 MED ORDER — TRAMADOL HCL 50 MG PO TABS
50.0000 mg | ORAL_TABLET | Freq: Four times a day (QID) | ORAL | Status: DC | PRN
  Administered 2013-12-10 – 2013-12-12 (×3): 50 mg via ORAL
  Filled 2013-12-10 (×3): qty 1

## 2013-12-10 MED ORDER — SODIUM CHLORIDE 0.9 % IV BOLUS (SEPSIS)
500.0000 mL | Freq: Once | INTRAVENOUS | Status: AC
  Administered 2013-12-10: 500 mL via INTRAVENOUS

## 2013-12-10 MED ORDER — PNEUMOCOCCAL VAC POLYVALENT 25 MCG/0.5ML IJ INJ
0.5000 mL | INJECTION | INTRAMUSCULAR | Status: AC
  Administered 2013-12-11: 0.5 mL via INTRAMUSCULAR
  Filled 2013-12-10 (×2): qty 0.5

## 2013-12-10 MED ORDER — ONDANSETRON HCL 4 MG PO TABS: 4.0000 mg | ORAL_TABLET | Freq: Four times a day (QID) | ORAL | Status: DC | PRN

## 2013-12-10 NOTE — ED Notes (Signed)
Patient went to the bathroom and did not give a urine sample.  We will have to wait until she has to go again.

## 2013-12-10 NOTE — Progress Notes (Signed)
Inappropriate CSW referral. Patient would need to apply for medicaid at her local department of social services. CSW provided patient with print out of local DSS office.     Chesley Noon, MSW, [place], 12/10/2013, 5:18 PM Evening Clinical Social Worker [phone]

## 2013-12-10 NOTE — ED Notes (Signed)
Pt sts this morning she had vertigo episode which caused her to "black out" and fall hitting her head on kitchen table. Pt has swelling to her right forehead.

## 2013-12-10 NOTE — ED Notes (Signed)
Patient transported to MRI 

## 2013-12-10 NOTE — ED Notes (Signed)
Pt eating meal before transfer upstairs

## 2013-12-10 NOTE — Progress Notes (Signed)
CSW attempted the assess the patient but she was admitted to hospital prior this attempt.    Chesley Noon, MSW, [place], 12/10/2013, 6:04 PM Evening Clinical Social Worker [phone]

## 2013-12-10 NOTE — ED Notes (Signed)
MD at bedside. 

## 2013-12-10 NOTE — H&P (Signed)
[facility] History and Physical  Judy [name] [id number] DOB: August 11, 1946 DOA: 12/10/2013  Referring physician:  Jeannett Senior PCP:  Elyn Peers, MD   Chief Complaint:  syncope  HPI:  The patient is a 68 y.o. year-old female with history of Obesity, HTN, HLD, Vertigo, Depression, anxiety, and bioplor disorder who presents with syncope.  The patient was last at their baseline health two days ago.  She lives at home alone.  Yesterday, she felt tired and she stayed at home and rested all day.  She ate and drank normally and denies fevers, chills, nausea, vomiting, diarrhea.  She did develop some mild sinus congestion with post-nasal gtt.  Denies cough and SOB.  This morning, she felt okay when she awoke.  She states that she was in the process of walking through the kitchen when she became dizzy "room spinning," her vision blacked out and she felt a little nauseated.  She passed out and hit her head on the kitchen table and awoke on the floor.  She denies loss of bowel and bladder adn thinks that she had LOC for only a few seconds.  When she awoke, she was SOB and had 4/10 substernal chest pressure with diaphoresis and nausea.  She had some mild aching in her left jaw, but no radiation to the shoulder or back.  Denies palpitations.  Symptoms lasted for approximately 20 minutes and resolved on their own.  She had a second episode of near-syncope after she drove to her sister's home.  Her sister brought her to the ER for evaluation.  She denies focal numbness, weakness, confusion, facial droop, difficulty speaking.    In ER, orthostatics were positive by numbers, but not symptomatically.  Labs WBC 10.8, BMP wnl with glucose of 111.  UA pending.  CXR neg.  Head CT neg for acute change.  MRI neg for stroke.  Troponin neg and ECG NSR stable from prior.  MRI brain with no acute abnl and small right scalp hematoma.  Moderate age changes suggestive of chronic small vessel disease.    Of note, pt  has had vertigo in the past and was prescribed meclizine, however, she has not  Had an episode in over a year.    Review of Systems:  General:  Denies fevers.  + chills since coming to ER.  Denies weight loss or gain HEENT:  Denies changes to hearing and vision, POSITIVE rhinorrhea, sinus congestion, sore throat CV:  Denies chest pain and palpitations, lower extremity edema.  PULM:  Per HPI   GI:  Denies nausea, vomiting, constipation, diarrhea.   GU:  Denies dysuria, frequency, urgency ENDO:  Denies polyuria, polydipsia.   HEME:  Denies hematemesis, blood in stools, melena, abnormal bruising or bleeding.  LYMPH:  Denies lymphadenopathy.   MSK:  Pain in right knee since fall, able to bear weight DERM:  Denies skin rash or ulcer.   NEURO:  Denies focal numbness, weakness, slurred speech, confusion, facial droop.  PSYCH:  Denies anxiety and depression.    Past Medical History  Diagnosis Date  . Hypertension   . Vertigo   . Arthritis   . Hyperlipidemia   . Allergic rhinitis   . Hearing loss in left ear     since birth  . Depression   . Anxiety   . Bipolar 1 disorder   . Obesity    Past Surgical History  Procedure Laterality Date  . Dilation and curettage of uterus  1960's  . Colonoscopy  08/25/2011  Procedure: COLONOSCOPY;  Surgeon: Landry Dyke, MD;  Location: [facility];  Service: Endoscopy;  Laterality: N/A;   Social History:  reports that she has never smoked. She has never used smokeless tobacco. She reports that she does not drink alcohol or use illicit drugs. Lives alone in house, does not need assist device.  Works part time at [employer].  Has a son.   Cousin and sister in close proximity  No Known Allergies  Family History  Problem Relation Age of Onset  . Prostate cancer Brother   . Hypertension Brother   . Kidney disease Brother   . Hypertension Mother   . Kidney disease Mother   . Stomach cancer Father   . Hyperlipidemia Sister   .  Hypothyroidism Sister   . Prostate cancer Brother   . Hypertension Brother      Prior to Admission medications   Medication Sig Start Date End Date Taking? Authorizing Provider  FLUoxetine (PROZAC) 20 MG tablet Take 40 mg by mouth daily.   Yes Historical Provider, MD  losartan-hydrochlorothiazide (HYZAAR) 100-25 MG per tablet Take 1 tablet by mouth daily.   Yes Historical Provider, MD  risperiDONE (RISPERDAL) 0.5 MG tablet Take 0.5 mg by mouth at bedtime.   Yes Historical Provider, MD   Physical Exam: Filed Vitals:   12/10/13 1058 12/10/13 1518 12/10/13 1519 12/10/13 1521  BP: 135/70 150/75 162/87 140/81  Pulse: 62 71 73 91  Temp: 97.9 F (36.6 C)     TempSrc: Oral     Resp: 16     SpO2: 96%        General:  Obese BF, NAD  Eyes:  PERRL, anicteric, non-injected.  Mild tearing of the right eye  ENT:  Nares congested.  OP clear, non-erythematous without plaques or exudates.  MMM.  Right forehead with 4cm hematoma above right eye.    Neck:  Supple without TM or JVD.    Lymph:  No cervical, supraclavicular, or submandibular LAD.  Cardiovascular:  RRR, normal S1, S2, without m/r/g.  2+ pulses, warm extremities  Respiratory:  CTA bilaterally without increased WOB.  Abdomen:  NABS.  Soft, ND/NT.    Skin:  No rashes or focal lesions.  Musculoskeletal:  Normal bulk and tone.  No LE edema.  Psychiatric:  A & O x 4.  Appropriate affect.  Neurologic:  CN 3-12 intact.  5/5 strength.  Sensation intact.  No nystagmus, however, she fetl lightheaded when she sat up for the resp exam.   Labs on Admission:  Basic Metabolic Panel:  Recent Labs Lab 12/10/13 1230  NA 139  K 3.8  CL 98  CO2 28  GLUCOSE 111*  BUN 10  CREATININE 0.98  CALCIUM 10.1   Liver Function Tests:  Recent Labs Lab 12/10/13 1230  AST 20  ALT 14  ALKPHOS 84  BILITOT 0.4  PROT 7.7  ALBUMIN 4.0   No results found for this basename: LIPASE, AMYLASE,  in the last 168 hours No results found for this  basename: AMMONIA,  in the last 168 hours CBC:  Recent Labs Lab 12/10/13 1230  WBC 10.8*  NEUTROABS 9.3*  HGB 14.0  HCT 41.2  MCV 88.4  PLT 254   Cardiac Enzymes: No results found for this basename: CKTOTAL, CKMB, CKMBINDEX, TROPONINI,  in the last 168 hours  BNP (last 3 results) No results found for this basename: PROBNP,  in the last 8760 hours CBG: No results found for this basename: GLUCAP,  in the last 168 hours  Radiological Exams on Admission: Ct Head Wo Contrast  12/10/2013   CLINICAL DATA:  Vertigo this morning with subsequent syncope and fall, hit hand with swelling to right forehead  EXAM: CT HEAD WITHOUT CONTRAST  TECHNIQUE: Contiguous axial images were obtained from the base of the skull through the vertex without intravenous contrast.  COMPARISON:  CT HEAD W/O CM dated 04/25/2011; CT HEAD W/O CM dated 09/11/2006  FINDINGS: Mild atrophy. Mild deep white matter low attenuation. No change in these findings. No evidence of infarct, hemorrhage, or extra-axial fluid. Mild right frontal scalp hematoma. No skull fracture.  IMPRESSION: No acute intracranial injury. Mild age-related involutional change stable from prior study.   Electronically Signed   By: Skipper Cliche M.D.   On: 12/10/2013 13:11   Mr Brain Wo Contrast  12/10/2013   CLINICAL DATA:  68 year old female with dizziness, fall, loss of consciousness. Initial encounter.  EXAM: MRI HEAD WITHOUT CONTRAST  TECHNIQUE: Multiplanar, multiecho pulse sequences of the brain and surrounding structures were obtained without intravenous contrast.  COMPARISON:  Non contrast head CT 12/10/2013 and earlier.  FINDINGS: Cerebral volume is within normal limits for age. No restricted diffusion to suggest acute infarction. No midline shift, mass effect, evidence of mass lesion, ventriculomegaly, extra-axial collection or acute intracranial hemorrhage. Cervicomedullary junction and pituitary are within normal limits. Negative visualized cervical  spine. Major intracranial vascular flow voids are preserved.  Scattered, Patchy and confluent cerebral white matter T2 and FLAIR hyperintensity. Similar Patchy and confluent signal abnormality in the pons. Moderate T2 heterogeneity in the basal ganglia, thalami relatively spared. Suggestion of chronic micro hemorrhages in the caudate nuclei (series 8, image 14). No cortical encephalomalacia identified. Midbrain and lower brainstem as well as the cerebellum appear normal. Visible internal auditory structures appear normal.  Right forehead small scalp hematoma. Visualized orbit soft tissues are within normal limits. Visualized paranasal sinuses and mastoids are clear. Bone marrow signal within normal limits. Other scalp soft tissues appear negative.  IMPRESSION: 1. No acute intracranial abnormality. Stable small right scalp hematoma. 2. Moderate for age signal changes in the brain, nonspecific but favor chronic small vessel disease related.   Electronically Signed   By: Lars Pinks M.D.   On: 12/10/2013 15:18   Dg Chest Portable 1 View  12/10/2013   CLINICAL DATA:  Dizziness.  Fall.  EXAM: PORTABLE CHEST - 1 VIEW  COMPARISON:  Chest x-ray 09/11/2006.  FINDINGS: Lung volumes are normal. No consolidative airspace disease. No pleural effusions. No evidence of pulmonary edema. No pneumothorax. Heart size appears mildly enlarged, however, this may be distorted by patient's rotation to the right, which also distorts mediastinal contours and reduces diagnostic sensitivity and specificity for mediastinal pathology.  IMPRESSION: 1. No radiographic evidence of acute cardiopulmonary disease.   Electronically Signed   By: Vinnie Langton M.D.   On: 12/10/2013 13:46    EKG: Independently reviewed. NSR (rate 60pbm)  Assessment/Plan Active Problems:   HYPERCHOLESTEROLEMIA   DEPRESSION   HYPERTENSION   Syncope   Bipolar 1 disorder   Obesity   Vertigo   Leukocytosis   URI (upper respiratory  infection)  ---  Syncope, likely related to orthostatic hypotension, however, she may also have some vertigo related to recent URI.  Given episode of chest pressure, however, with radiation to jaw, and risk factors of HTN, HLD, obesity, admit for observation on telemetry -  Telemetry -  Troponins -  NTG prn -  ASA daily -  IVF  -  Check A1c -  Meclizine -  ECHO ordered, but could consider doing as outpateint -  Hold HCTZ for now  Dizziness, ddx includes orthostatics, BPPV, viral vertigo, Meniere's -  Meclizine as above -  Consider referral to vestibular rehab if not improving  Obesity, encourage weight loss  HTN/HLD, hold HCTZ for mild dehydration and continue losartan  Depression/anxiety, stable.  Continue prozac  Bipolar, stable.  Continue risperidone  Diet:  Healthy heart Access:  PIV IVF:  yes Proph:  lovenox  Code Status: full Family Communication: patient and her cousin Disposition Plan: Admit to telemetry under observation overnight.  Likely d/c in aM  Time spent: 60 min Kyair Ditommaso, [facility] Pager [phone]  If 7PM-7AM, please contact night-coverage [email or web address] Password [facility] 12/10/2013, 4:48 PM

## 2013-12-10 NOTE — ED Notes (Signed)
MD at bedside. Admitting MD [place] here to evaluate this pt for admisstion

## 2013-12-10 NOTE — ED Notes (Signed)
Asked pt again about voiding Urine and she says cant yet. Told her we needed a sample
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
## 2013-12-10 NOTE — ED Provider Notes (Signed)
CSN: [id number]     Arrival date & time 12/10/13  1044 History   First MD Initiated Contact with Patient 12/10/13 1125     Chief Complaint  Patient presents with  . Fall  . Dizziness     (Consider location/radiation/quality/duration/timing/severity/associated sxs/prior Treatment) HPI Judy [name] is a 68 y.o. female  Who presents to emergency department complaining of 2 syncopal episodes and dizziness. Patient states she has history of vertigo. She states she was in the kitchen today she just got out to walk across the room, when she began having sensation of room spinning. Patient states and her vision went black and she then woke up on the floor. Patient states she was able to get up and drive herself to her sister's house. Patient states that at her sister's house she had another episode of syncope while sitting at a table. This was witnessed by her sister. Sister states that she was sitting down and then slumped over for a few seconds. Denies any shaking, denies any confusion after waking him. Patient states that she has never had any syncopal episodes in the past especially with her vertigo. She currently does not take any medications for her vertigo. Patient reports pain to her head, and points to a hematoma on her forehead. She denies any other pain or any other complaints. She denies any chest pain or shortness of breath. She denies any recent illness. She denies any nausea, vomiting, diarrhea.   Past Medical History  Diagnosis Date  . Hypertension   . Vertigo   . Arthritis   . Hyperlipidemia    Past Surgical History  Procedure Laterality Date  . Dilation and curettage of uterus  1960's  . Colonoscopy  08/25/2011    Procedure: COLONOSCOPY;  Surgeon: Landry Dyke, MD;  Location: [facility];  Service: Endoscopy;  Laterality: N/A;   Family History  Problem Relation Age of Onset  . Colon cancer Brother    History  Substance Use Topics  . Smoking status: Never Smoker   .  Smokeless tobacco: Not on file  . Alcohol Use: No   OB History   Grav Para Term Preterm Abortions TAB SAB Ect Mult Living                 Review of Systems  Constitutional: Negative for fever and chills.  HENT: Negative for congestion.   Eyes: Negative for visual disturbance.  Respiratory: Negative for cough, chest tightness and shortness of breath.   Cardiovascular: Negative for chest pain, palpitations and leg swelling.  Gastrointestinal: Negative for nausea, vomiting, abdominal pain and diarrhea.  Genitourinary: Negative for dysuria and flank pain.  Musculoskeletal: Negative for arthralgias, myalgias, neck pain and neck stiffness.  Skin: Negative for rash.  Neurological: Positive for dizziness, syncope and headaches. Negative for facial asymmetry, speech difficulty, weakness and numbness.  All other systems reviewed and are negative.      Allergies  Review of patient's allergies indicates no known allergies.  Home Medications   Current Outpatient Rx  Name  Route  Sig  Dispense  Refill  . FLUoxetine (PROZAC) 20 MG tablet   Oral   Take 40 mg by mouth daily.         [name] losartan-hydrochlorothiazide (HYZAAR) 100-25 MG per tablet   Oral   Take 1 tablet by mouth daily.         . risperiDONE (RISPERDAL) 0.5 MG tablet   Oral   Take 0.5 mg by mouth at bedtime.  BP 135/70  Pulse 62  Temp(Src) 97.9 F (36.6 C) (Oral)  Resp 16  SpO2 96% Physical Exam  Nursing note and vitals reviewed. Constitutional: She is oriented to person, place, and time. She appears well-developed and well-nourished. No distress.  HENT:  Head: Normocephalic.  Hematoma to the right forehead  Eyes: Conjunctivae and EOM are normal. Pupils are equal, round, and reactive to light.  Neck: Normal range of motion. Neck supple.  Cardiovascular: Normal rate, regular rhythm and normal heart sounds.   Pulmonary/Chest: Effort normal and breath sounds normal. No respiratory distress. She has no  wheezes. She has no rales.  Abdominal: Soft. Bowel sounds are normal. She exhibits no distension. There is no tenderness. There is no rebound.  Musculoskeletal: She exhibits no edema.  Neurological: She is alert and oriented to person, place, and time. No cranial nerve deficit. Coordination normal.  5/5 and equal upper and lower extremity strength bilaterally. Equal grip strength bilaterally. Normal finger to nose and heel to shin. No pronator drift. Horizontal nystagmus to the left present. Negative rhomb erg. Normal gait.   Skin: Skin is warm and dry.  Psychiatric: She has a normal mood and affect. Her behavior is normal.    ED Course  Procedures (including critical care time) Labs Review Labs Reviewed  CBC WITH DIFFERENTIAL - Abnormal; Notable for the following:    WBC 10.8 (*)    Neutrophils Relative % 86 (*)    Neutro Abs 9.3 (*)    Lymphocytes Relative 8 (*)    All other components within normal limits  COMPREHENSIVE METABOLIC PANEL - Abnormal; Notable for the following:    Glucose, Bld 111 (*)    GFR calc non Af Amer 59 (*)    GFR calc Af Amer 68 (*)    All other components within normal limits  URINE CULTURE  URINALYSIS, ROUTINE W REFLEX MICROSCOPIC  I-STAT TROPOININ, ED  CBG MONITORING, ED   Imaging Review Ct Head Wo Contrast  12/10/2013   CLINICAL DATA:  Vertigo this morning with subsequent syncope and fall, hit hand with swelling to right forehead  EXAM: CT HEAD WITHOUT CONTRAST  TECHNIQUE: Contiguous axial images were obtained from the base of the skull through the vertex without intravenous contrast.  COMPARISON:  CT HEAD W/O CM dated 04/25/2011; CT HEAD W/O CM dated 09/11/2006  FINDINGS: Mild atrophy. Mild deep white matter low attenuation. No change in these findings. No evidence of infarct, hemorrhage, or extra-axial fluid. Mild right frontal scalp hematoma. No skull fracture.  IMPRESSION: No acute intracranial injury. Mild age-related involutional change stable from prior  study.   Electronically Signed   By: Skipper Cliche M.D.   On: 12/10/2013 13:11   Mr Brain Wo Contrast  12/10/2013   CLINICAL DATA:  68 year old female with dizziness, fall, loss of consciousness. Initial encounter.  EXAM: MRI HEAD WITHOUT CONTRAST  TECHNIQUE: Multiplanar, multiecho pulse sequences of the brain and surrounding structures were obtained without intravenous contrast.  COMPARISON:  Non contrast head CT 12/10/2013 and earlier.  FINDINGS: Cerebral volume is within normal limits for age. No restricted diffusion to suggest acute infarction. No midline shift, mass effect, evidence of mass lesion, ventriculomegaly, extra-axial collection or acute intracranial hemorrhage. Cervicomedullary junction and pituitary are within normal limits. Negative visualized cervical spine. Major intracranial vascular flow voids are preserved.  Scattered, Patchy and confluent cerebral white matter T2 and FLAIR hyperintensity. Similar Patchy and confluent signal abnormality in the pons. Moderate T2 heterogeneity in the basal ganglia, thalami  relatively spared. Suggestion of chronic micro hemorrhages in the caudate nuclei (series 8, image 14). No cortical encephalomalacia identified. Midbrain and lower brainstem as well as the cerebellum appear normal. Visible internal auditory structures appear normal.  Right forehead small scalp hematoma. Visualized orbit soft tissues are within normal limits. Visualized paranasal sinuses and mastoids are clear. Bone marrow signal within normal limits. Other scalp soft tissues appear negative.  IMPRESSION: 1. No acute intracranial abnormality. Stable small right scalp hematoma. 2. Moderate for age signal changes in the brain, nonspecific but favor chronic small vessel disease related.   Electronically Signed   By: Lars Pinks M.D.   On: 12/10/2013 15:18   Dg Chest Portable 1 View  12/10/2013   CLINICAL DATA:  Dizziness.  Fall.  EXAM: PORTABLE CHEST - 1 VIEW  COMPARISON:  Chest x-ray  09/11/2006.  FINDINGS: Lung volumes are normal. No consolidative airspace disease. No pleural effusions. No evidence of pulmonary edema. No pneumothorax. Heart size appears mildly enlarged, however, this may be distorted by patient's rotation to the right, which also distorts mediastinal contours and reduces diagnostic sensitivity and specificity for mediastinal pathology.  IMPRESSION: 1. No radiographic evidence of acute cardiopulmonary disease.   Electronically Signed   By: Vinnie Langton M.D.   On: 12/10/2013 13:46     EKG Interpretation   Date/Time:  Monday December 10 2013 12:26:47 EST Ventricular Rate:  60 PR Interval:  132 QRS Duration: 86 QT Interval:  463 QTC Calculation: 463 R Axis:   30 Text Interpretation:  Sinus bradycardia Nonspecific ST and T wave  abnormality No significant change since last tracing Confirmed by Maryan Rued   MD, Loree Fee ([id number]) on 12/10/2013 12:31:24 PM      MDM   Final diagnoses:  Syncope   Patient with 2 syncopal episodes today, history of vertigo but states this does not feel the same. On initial exam patient does have vertical nystagmus. Her symptoms have resolved at this time. We'll get labs, EKG, troponin, chest x-ray, CT head.  4:06 PM CT head negative, labs unremarkable. Patient's heart rate did increase on her orthostatic vital signs from 70s to 90s however her blood pressure did not have any significant change. She did not get dizzy while standing up. I did treat her with Valium for her symptoms, and patient continues to be asymptomatic in emergency department. Patient's sister expressed concern stating that her face looks asymmetrical to her. Patient also stated she had some difficulty swallowing although she did pass her swallowing stroke test. Will get MRI to rule out a stroke. Patient will be admitted to medicine for further evaluation of her syncope.  Filed Vitals:   12/10/13 1058 12/10/13 1518 12/10/13 1519 12/10/13 1521  BP: 135/70 150/75  162/87 140/81  Pulse: 62 71 73 91  Temp: 97.9 F (36.6 C)     TempSrc: Oral     Resp: 16     SpO2: 96%        Enyla Lisbon A Ifrah Vest, PA-C 12/10/13 1607

## 2013-12-10 NOTE — ED Notes (Signed)
Provided her with ice pack for bump on head

## 2013-12-11 ENCOUNTER — Inpatient Hospital Stay (HOSPITAL_COMMUNITY): Payer: Medicare Other

## 2013-12-11 DIAGNOSIS — I369 Nonrheumatic tricuspid valve disorder, unspecified: Secondary | ICD-10-CM

## 2013-12-11 LAB — CBC
HCT: 35.7 % — ABNORMAL LOW (ref 36.0–46.0)
HEMOGLOBIN: 11.9 g/dL — AB (ref 12.0–15.0)
MCH: 29.4 pg (ref 26.0–34.0)
MCHC: 33.3 g/dL (ref 30.0–36.0)
MCV: 88.1 fL (ref 78.0–100.0)
Platelets: 235 10*3/uL (ref 150–400)
RBC: 4.05 MIL/uL (ref 3.87–5.11)
RDW: 14.4 % (ref 11.5–15.5)
WBC: 7.5 10*3/uL (ref 4.0–10.5)

## 2013-12-11 LAB — BASIC METABOLIC PANEL
BUN: 11 mg/dL (ref 6–23)
CALCIUM: 9.2 mg/dL (ref 8.4–10.5)
CO2: 25 mEq/L (ref 19–32)
Chloride: 104 mEq/L (ref 96–112)
Creatinine, Ser: 0.8 mg/dL (ref 0.50–1.10)
GFR calc non Af Amer: 75 mL/min — ABNORMAL LOW (ref >90)
GFR, EST AFRICAN AMERICAN: 87 mL/min — AB (ref >90)
Glucose, Bld: 121 mg/dL — ABNORMAL HIGH (ref 70–99)
POTASSIUM: 3.2 meq/L — AB (ref 3.7–5.3)
Sodium: 141 mEq/L (ref 137–147)

## 2013-12-11 LAB — HEMOGLOBIN A1C
HEMOGLOBIN A1C: 6.1 % — AB (ref <5.7)
Mean Plasma Glucose: 128 mg/dL — ABNORMAL HIGH (ref <117)

## 2013-12-11 LAB — GLUCOSE, CAPILLARY: Glucose-Capillary: 96 mg/dL (ref 70–99)

## 2013-12-11 LAB — TROPONIN I: Troponin I: <0.3 ng/mL (ref <0.30)

## 2013-12-11 LAB — TSH: TSH: 0.564 u[IU]/mL (ref 0.350–4.500)

## 2013-12-11 MED ORDER — POTASSIUM CHLORIDE CRYS ER 20 MEQ PO TBCR
30.0000 meq | EXTENDED_RELEASE_TABLET | Freq: Once | ORAL | Status: AC
  Administered 2013-12-11: 30 meq via ORAL
  Filled 2013-12-11: qty 1

## 2013-12-11 MED ORDER — DEXTROSE 5 % IV SOLN
1.0000 g | INTRAVENOUS | Status: DC
  Administered 2013-12-11 – 2013-12-14 (×4): 1 g via INTRAVENOUS
  Filled 2013-12-11 (×4): qty 10

## 2013-12-11 NOTE — Progress Notes (Signed)
CSW spoke with patient & sister at bedside re: Medicaid. Patient informed CSW that she has already submitted an application and wanted to check on the progress. CSW directed patient to check with the case worker at [facility] that was assigned to her case.   Patient states she plans to return home at discharge, not interested in SNF.   No further CSW needs identified - CSW signing off.   Raynaldo Opitz, [facility] Clinical Social Worker cell #: [phone]

## 2013-12-11 NOTE — Progress Notes (Signed)
PROGRESS NOTE  Judy [name] [id number] DOB: [birth year] DOA: 12/10/2013 PCP: Elyn Peers, MD  Assessment/Plan: Syncope, likely related to orthostatic hypotension, however, she may also have some vertigo related to recent URI. - 2d echo - troponin negative x 3 Dizziness, ddx includes orthostatics, BPPV, viral vertigo, Meniere's  - Meclizine as above - continues to be very dizzy this morning, endorses weakness. PT consult.  ?UTI - UA not conclusive of infection; culture pending, empiric Ceftriaxone. Obesity, encourage weight loss  HTN/HLD, hold HCTZ for mild dehydration and continue losartan  Depression/anxiety, stable. Continue prozac  Bipolar, stable. Continue risperidone   Diet: heart Fluids: none DVT Prophylaxis: Lovenox  Code Status: Full Family Communication: none  Disposition Plan: home when ready, likely in 1 day  Consultants:  none  Procedures:  2D echo pending.    Antibiotics  Anti-infectives   Start     Dose/Rate Route Frequency Ordered Stop   12/11/13 0915  cefTRIAXone (ROCEPHIN) 1 g in dextrose 5 % 50 mL IVPB     1 g 100 mL/hr over 30 Minutes Intravenous Every 24 hours 12/11/13 0903       Antibiotics Given (last 72 hours)   None      HPI/Subjective: - complains of weakness, dizziness.  Objective: Filed Vitals:   12/10/13 1648 12/10/13 1900 12/10/13 2255 12/11/13 0611  BP: 137/80 161/71 142/73 137/68  Pulse: 84 73 70 73  Temp: 98.8 F (37.1 C) 97.9 F (36.6 C) 98.6 F (37 C) 98.2 F (36.8 C)  TempSrc: Axillary Oral Oral Oral  Resp: 16 18 20 20   Height:  5\' 10"  (1.778 m)    Weight:  102.059 kg (225 lb)    SpO2: 99% 100% 98% 96%    Intake/Output Summary (Last 24 hours) at 12/11/13 0904 Last data filed at 12/11/13 0700  Gross per 24 hour  Intake   1570 ml  Output    750 ml  Net    820 ml   Filed Weights   12/10/13 1900  Weight: 102.059 kg (225 lb)    Exam:   General:  NAD  Cardiovascular: regular rate and rhythm,  without MRG  Respiratory: good air movement, clear to auscultation throughout, no wheezing, ronchi or rales  Abdomen: soft, not tender to palpation, positive bowel sounds  MSK: no peripheral edema  Neuro: non focal  Data Reviewed: Basic Metabolic Panel:  Recent Labs Lab 12/10/13 1230 12/11/13 0540  NA 139 141  K 3.8 3.2*  CL 98 104  CO2 28 25  GLUCOSE 111* 121*  BUN 10 11  CREATININE 0.98 0.80  CALCIUM 10.1 9.2   Liver Function Tests:  Recent Labs Lab 12/10/13 1230  AST 20  ALT 14  ALKPHOS 84  BILITOT 0.4  PROT 7.7  ALBUMIN 4.0   No results found for this basename: LIPASE, AMYLASE,  in the last 168 hours No results found for this basename: AMMONIA,  in the last 168 hours CBC:  Recent Labs Lab 12/10/13 1230 12/11/13 0540  WBC 10.8* 7.5  NEUTROABS 9.3*  --   HGB 14.0 11.9*  HCT 41.2 35.7*  MCV 88.4 88.1  PLT 254 235   Cardiac Enzymes:  Recent Labs Lab 12/10/13 1811 12/10/13 2350 12/11/13 0540  TROPONINI <0.30 <0.30 <0.30   BNP (last 3 results) No results found for this basename: PROBNP,  in the last 8760 hours CBG: No results found for this basename: GLUCAP,  in the last 168 hours  No results found for this  or any previous visit (from the past 240 hour(s)).   Studies: Ct Head Wo Contrast  12/10/2013   CLINICAL DATA:  Vertigo this morning with subsequent syncope and fall, hit hand with swelling to right forehead  EXAM: CT HEAD WITHOUT CONTRAST  TECHNIQUE: Contiguous axial images were obtained from the base of the skull through the vertex without intravenous contrast.  COMPARISON:  CT HEAD W/O CM dated 04/25/2011; CT HEAD W/O CM dated 09/11/2006  FINDINGS: Mild atrophy. Mild deep white matter low attenuation. No change in these findings. No evidence of infarct, hemorrhage, or extra-axial fluid. Mild right frontal scalp hematoma. No skull fracture.  IMPRESSION: No acute intracranial injury. Mild age-related involutional change stable from prior study.    Electronically Signed   By: Skipper Cliche M.D.   On: 12/10/2013 13:11   Mr Brain Wo Contrast  12/10/2013   CLINICAL DATA:  68 year old female with dizziness, fall, loss of consciousness. Initial encounter.  EXAM: MRI HEAD WITHOUT CONTRAST  TECHNIQUE: Multiplanar, multiecho pulse sequences of the brain and surrounding structures were obtained without intravenous contrast.  COMPARISON:  Non contrast head CT 12/10/2013 and earlier.  FINDINGS: Cerebral volume is within normal limits for age. No restricted diffusion to suggest acute infarction. No midline shift, mass effect, evidence of mass lesion, ventriculomegaly, extra-axial collection or acute intracranial hemorrhage. Cervicomedullary junction and pituitary are within normal limits. Negative visualized cervical spine. Major intracranial vascular flow voids are preserved.  Scattered, Patchy and confluent cerebral white matter T2 and FLAIR hyperintensity. Similar Patchy and confluent signal abnormality in the pons. Moderate T2 heterogeneity in the basal ganglia, thalami relatively spared. Suggestion of chronic micro hemorrhages in the caudate nuclei (series 8, image 14). No cortical encephalomalacia identified. Midbrain and lower brainstem as well as the cerebellum appear normal. Visible internal auditory structures appear normal.  Right forehead small scalp hematoma. Visualized orbit soft tissues are within normal limits. Visualized paranasal sinuses and mastoids are clear. Bone marrow signal within normal limits. Other scalp soft tissues appear negative.  IMPRESSION: 1. No acute intracranial abnormality. Stable small right scalp hematoma. 2. Moderate for age signal changes in the brain, nonspecific but favor chronic small vessel disease related.   Electronically Signed   By: Lars Pinks M.D.   On: 12/10/2013 15:18   Dg Chest Portable 1 View  12/10/2013   CLINICAL DATA:  Dizziness.  Fall.  EXAM: PORTABLE CHEST - 1 VIEW  COMPARISON:  Chest x-ray 09/11/2006.   FINDINGS: Lung volumes are normal. No consolidative airspace disease. No pleural effusions. No evidence of pulmonary edema. No pneumothorax. Heart size appears mildly enlarged, however, this may be distorted by patient's rotation to the right, which also distorts mediastinal contours and reduces diagnostic sensitivity and specificity for mediastinal pathology.  IMPRESSION: 1. No radiographic evidence of acute cardiopulmonary disease.   Electronically Signed   By: Vinnie Langton M.D.   On: 12/10/2013 13:46    Scheduled Meds: . [COMPLETED] sodium chloride   Intravenous STAT  . cefTRIAXone (ROCEPHIN)  IV  1 g Intravenous Q24H  . docusate sodium  100 mg Oral BID  . enoxaparin (LOVENOX) injection  40 mg Subcutaneous Q24H  . FLUoxetine  40 mg Oral Daily  . influenza vac split quadrivalent PF  0.5 mL Intramuscular Tomorrow-1000  . losartan  100 mg Oral Daily  . pneumococcal 23 valent vaccine  0.5 mL Intramuscular Tomorrow-1000  . risperiDONE  0.5 mg Oral QHS  . senna  1 tablet Oral BID  . sodium chloride  3 mL Intravenous Q12H   Continuous Infusions: . sodium chloride      Active Problems:   HYPERCHOLESTEROLEMIA   DEPRESSION   HYPERTENSION   Syncope   Bipolar 1 disorder   Obesity   Vertigo   Leukocytosis   URI (upper respiratory infection)  Time spent: 35  This note has been created with Surveyor, quantity. Any transcriptional errors are unintentional.   Marzetta Board, MD [facility] Pager [phone]. If 7 PM - 7 AM, please contact night-coverage at [email or web address], password [facility] 12/11/2013, 9:04 AM  LOS: 1 day

## 2013-12-11 NOTE — Progress Notes (Signed)
Echocardiogram 2D Echocardiogram has been performed.  Judy [name] 12/11/2013, 2:55 PM

## 2013-12-11 NOTE — Progress Notes (Signed)
UR completed. Patient changed to inpatient- requiring IVF @ 75cc/hr and IV antibiotics.

## 2013-12-12 ENCOUNTER — Inpatient Hospital Stay (HOSPITAL_COMMUNITY): Payer: Medicare Other

## 2013-12-12 LAB — URINE CULTURE

## 2013-12-12 LAB — BASIC METABOLIC PANEL
BUN: 11 mg/dL (ref 6–23)
CALCIUM: 8.7 mg/dL (ref 8.4–10.5)
CO2: 24 meq/L (ref 19–32)
Chloride: 107 mEq/L (ref 96–112)
Creatinine, Ser: 0.76 mg/dL (ref 0.50–1.10)
GFR calc Af Amer: >90 mL/min (ref >90)
GFR calc non Af Amer: 86 mL/min — ABNORMAL LOW (ref >90)
GLUCOSE: 107 mg/dL — AB (ref 70–99)
Potassium: 3.3 mEq/L — ABNORMAL LOW (ref 3.7–5.3)
SODIUM: 142 meq/L (ref 137–147)

## 2013-12-12 MED ORDER — POTASSIUM CHLORIDE CRYS ER 20 MEQ PO TBCR
40.0000 meq | EXTENDED_RELEASE_TABLET | Freq: Once | ORAL | Status: AC
  Administered 2013-12-12: 40 meq via ORAL
  Filled 2013-12-12: qty 2

## 2013-12-12 NOTE — Progress Notes (Signed)
Patient continues to complain of pain in bilateral legs since fall.  Pain worse in right leg; has difficulty putting weight on right leg.  Notified on call and x-ray was ordered.  Patient also complains of difficulty swallowing.  Family is interested in having a swallow eval performed.  Will continue to monitor patient.   Owens Shark, Erion Weightman Cherie

## 2013-12-12 NOTE — Progress Notes (Addendum)
Patient ID: Judy [name], female   DOB: 08-19-1946, 68 y.o.   MRN: [id number] [facility] PROGRESS NOTE  Judy [name] [id number] DOB: [birth year] DOA: 12/10/2013 PCP: Elyn Peers, MD  Brief narrative: 40 y.o. year-old female with history of HTN, HLD, Vertigo, Depression, anxiety, and bioplor disorder who presented to [facility] ED with main concern of sudden episode of passing out one day prior to this admission, while walking through the kitchen, associated with dizziness and lightheadedness. No SOB, chest pain, no abdominal or urinary concerns. As she fell down, she hit the floor with her head.   Active Problems:   Syncope - appears to be secondary to orthostatic hypotension  - no events on telemetry - will repeat orthostatic vitals in AM - stop IVF  - MRI negative for stroke, CE's x 3 negative, 2 D ECHO with normal EF but grade II diastolic dysfunction  - discuss with pt plan to d/c to SNF in am    UTI - continue empiric Rocephin   HTN  - reasonable inpatient control - continue Losartan    DEPRESSION - pt with flat affect, continue home medical regimen with Prozac and Risperidone    Bipolar 1 disorder - stable at this time    Leukocytosis - possibly related to UTI - continue Rocephin as noted above - WBC is trending down and is within normal limits  - will repeat CBC in AM   Right knee pain - with mild - mod effusion - proceed with MRI  - consider ortho consult in AM based on MRI results    Hypokalemia - supplement and repeat BMP in AM   Grade Ii diastolic dysfunction - based on 2 D ECHO done this admission - will stop IVF - daily weights, I's and O's - weight is 225 lbs this AM   Consultants:  None Procedures/Studies: Dg Knee Right Port  12/11/2013  No acute fracture deformity. Moderate suprapatellar joint effusion.  Moderate medial, mild to mod patellofemoral compartment osteoarthrosis.    Antibiotics:  Rocephin 3/2 -->  Code Status: Full Family  Communication: Pt at bedside Disposition Plan: Home when medically stable  HPI/Subjective: No events overnight.   Objective: Filed Vitals:   12/11/13 1444 12/11/13 1726 12/11/13 2119 12/12/13 0401  BP: 136/68  140/56 135/69  Pulse: 71  73 68  Temp: 97.5 F (36.4 C)  98.7 F (37.1 C) 98.5 F (36.9 C)  TempSrc: Oral Oral Oral Oral  Resp: 18  18 18   Height:      Weight:      SpO2: 99%  95% 96%    Intake/Output Summary (Last 24 hours) at 12/12/13 1821 Last data filed at 12/12/13 0700  Gross per 24 hour  Intake    975 ml  Output    400 ml  Net    575 ml    Exam:   General:  Pt is alert, follows commands appropriately, not in acute distress  Cardiovascular: Regular rate and rhythm, S1/S2, no murmurs, no rubs, no gallops  Respiratory: Clear to auscultation bilaterally, no wheezing, no crackles, no rhonchi  Abdomen: Soft, non tender, non distended, bowel sounds present, no guarding  Extremities: No edema, pulses DP and PT palpable bilaterally, right knee TTP with fluid mostly on the medical aspect   Neuro: Grossly nonfocal  Data Reviewed: Basic Metabolic Panel:  Recent Labs Lab 12/10/13 1230 12/11/13 0540 12/12/13 0448  NA 139 141 142  K 3.8 3.2* 3.3*  CL 98 104 107  CO2  28 25 24   GLUCOSE 111* 121* 107*  BUN 10 11 11   CREATININE 0.98 0.80 0.76  CALCIUM 10.1 9.2 8.7   Liver Function Tests:  Recent Labs Lab 12/10/13 1230  AST 20  ALT 14  ALKPHOS 84  BILITOT 0.4  PROT 7.7  ALBUMIN 4.0   CBC:  Recent Labs Lab 12/10/13 1230 12/11/13 0540  WBC 10.8* 7.5  NEUTROABS 9.3*  --   HGB 14.0 11.9*  HCT 41.2 35.7*  MCV 88.4 88.1  PLT 254 235   Cardiac Enzymes:  Recent Labs Lab 12/10/13 1811 12/10/13 2350 12/11/13 0540  TROPONINI <0.30 <0.30 <0.30   CBG:  Recent Labs Lab 12/10/13 1235  GLUCAP 96    Recent Results (from the past 240 hour(s))  URINE CULTURE     Status: None   Collection Time    12/10/13  8:01 PM      Result Value Ref  Range Status   Specimen Description URINE, CLEAN CATCH   Final   Special Requests NONE   Final   Culture  Setup Time     Final   Value: 12/11/2013 01:44     Performed at [place]     Final   Value: >=100,000 COLONIES/ML     Performed at [facility]   Culture     Final   Value: GROUP B STREP(S.AGALACTIAE)ISOLATED     Note: TESTING AGAINST S. AGALACTIAE NOT ROUTINELY PERFORMED DUE TO PREDICTABILITY OF AMP/PEN/VAN SUSCEPTIBILITY.     Performed at [facility]   Report Status 12/12/2013 FINAL   Final     Scheduled Meds: . cefTRIAXone  IV  1 g Intravenous Q24H  . docusate sodium  100 mg Oral BID  . enoxaparin  injection  40 mg Subcutaneous Q24H  . FLUoxetine  40 mg Oral Daily  . losartan  100 mg Oral Daily  . risperiDONE  0.5 mg Oral QHS  . senna  1 tablet Oral BID   Continuous Infusions: . sodium chloride 75 mL/hr at 12/12/13 0504   Faye Ramsay, MD  [facility] Pager [phone]  If 7PM-7AM, please contact night-coverage [email or web address] Password [email or web address] 12/12/2013, 6:21 PM   LOS: 2 days

## 2013-12-12 NOTE — Evaluation (Signed)
Physical Therapy Evaluation Patient Details Name: Judy [name] MRN: [id number] DOB: 04/29/1946 Today's Date: 12/12/2013 Time: [id number] PT Time Calculation (min): 23 min  PT Assessment / Plan / Recommendation History of Present Illness  68 yo female admitted with syncope, dizziness, fall. Hx of veritgo.Pt lives alone.   Clinical Impression  On eval, pt required Min assist for mobility-able to ambulate ~30 feet with walker. Demonstrates general weakness, decreased activity tolerance, and impaired gait and balance. Mobility is somewhat limited by pain. Discussed d/c plan-possibility for ST rehab since pt does live alone-pt states she would think about it. Recommend ST rehab at this time (if pt will agree). Pt may be able to d/c home if pain and mobility improves.     PT Assessment  Patient needs continued PT services    Follow Up Recommendations  SNF (if pt agreeable. )    Does the patient have the potential to tolerate intense rehabilitation      Barriers to Discharge        Equipment Recommendations  Rolling walker with 5" wheels    Recommendations for Other Services OT consult   Frequency Min 3X/week    Precautions / Restrictions Precautions Precautions: Fall Restrictions Weight Bearing Restrictions: No   Pertinent Vitals/Pain 10/10 R knee with activity      Mobility  Bed Mobility Overal bed mobility: Needs Assistance Supine to sit: HOB elevated;Modified independent (Device/Increase time) General bed mobility comments: Pt denied dizziness while getting to EOB.  Transfers Overall transfer level: Needs assistance Equipment used: Rolling walker (2 wheeled) Transfers: Sit to/from Stand Sit to Stand: From elevated surface;Min assist General transfer comment: Assist to rise, stabilize, control descent. VCs safety, technique, hand placement. Pt denied dizziness during static standing.  Ambulation/Gait Ambulation/Gait assistance: Min assist Ambulation Distance (Feet):  30 Feet Assistive device: Rolling walker (2 wheeled) Gait Pattern/deviations: Decreased stride length;Decreased step length - right;Step-to pattern;Antalgic;Trunk flexed General Gait Details: slow gait speed. c/o R worse than L knee pain. fatigues fairly easily. pt denied dizziness during ambulation. distance limited by pain-pt rates 10/10    Exercises     PT Diagnosis: Difficulty walking;Abnormality of gait;Acute pain;Generalized weakness  PT Problem List: Decreased strength;Decreased activity tolerance;Decreased balance;Decreased mobility;Obesity;Pain;Decreased knowledge of use of DME PT Treatment Interventions: DME instruction;Gait training;Functional mobility training;Therapeutic activities;Therapeutic exercise;Balance training;Patient/family education     PT Goals(Current goals can be found in the care plan section) Acute Rehab PT Goals Patient Stated Goal: home. less pain PT Goal Formulation: With patient Time For Goal Achievement: 12/26/13 Potential to Achieve Goals: Good  Visit Information  Last PT Received On: 12/12/13 Assistance Needed: +1 History of Present Illness: 67 yo female admitted with syncope, dizziness, fall. Hx of veritgo.Pt lives alone.        Prior [place] expects to be discharged to:: Private residence Living Arrangements: Alone Type of Home: Apartment (senior living) Home Access: Level entry Home Layout: One [place]: None Prior Function Level of Independence: Independent Communication Communication: No difficulties    Cognition  Cognition Arousal/Alertness: Awake/alert Behavior During Therapy: WFL for tasks assessed/performed Overall Cognitive Status: Within Functional Limits for tasks assessed    Extremity/Trunk Assessment Upper Extremity Assessment Upper Extremity Assessment: Overall WFL for tasks assessed Lower Extremity Assessment Lower Extremity Assessment: Generalized weakness (limited by  pain) Cervical / Trunk Assessment Cervical / Trunk Assessment: Normal   Balance    End of Session PT - End of Session Equipment Utilized During Treatment: Gait belt Activity Tolerance: Patient  limited by fatigue;Patient limited by pain Patient left: in chair;with call bell/phone within reach  GP     Weston Anna, MPT Pager: [phone]

## 2013-12-13 LAB — CBC
HEMATOCRIT: 38.2 % (ref 36.0–46.0)
HEMOGLOBIN: 12.6 g/dL (ref 12.0–15.0)
MCH: 29.3 pg (ref 26.0–34.0)
MCHC: 33 g/dL (ref 30.0–36.0)
MCV: 88.8 fL (ref 78.0–100.0)
Platelets: 237 10*3/uL (ref 150–400)
RBC: 4.3 MIL/uL (ref 3.87–5.11)
RDW: 14.7 % (ref 11.5–15.5)
WBC: 6.3 10*3/uL (ref 4.0–10.5)

## 2013-12-13 LAB — BASIC METABOLIC PANEL
BUN: 7 mg/dL (ref 6–23)
CHLORIDE: 103 meq/L (ref 96–112)
CO2: 26 meq/L (ref 19–32)
Calcium: 9.5 mg/dL (ref 8.4–10.5)
Creatinine, Ser: 0.69 mg/dL (ref 0.50–1.10)
GFR calc Af Amer: >90 mL/min (ref >90)
GFR calc non Af Amer: 89 mL/min — ABNORMAL LOW (ref >90)
Glucose, Bld: 96 mg/dL (ref 70–99)
Potassium: 3.4 mEq/L — ABNORMAL LOW (ref 3.7–5.3)
Sodium: 141 mEq/L (ref 137–147)

## 2013-12-13 MED ORDER — TRAMADOL HCL 50 MG PO TABS: 50.0000 mg | ORAL_TABLET | Freq: Four times a day (QID) | ORAL | Status: DC | PRN

## 2013-12-13 MED ORDER — POTASSIUM CHLORIDE CRYS ER 20 MEQ PO TBCR
40.0000 meq | EXTENDED_RELEASE_TABLET | Freq: Once | ORAL | Status: AC
  Administered 2013-12-13: 40 meq via ORAL
  Filled 2013-12-13: qty 2

## 2013-12-13 MED ORDER — MECLIZINE HCL 12.5 MG PO TABS: 12.5000 mg | ORAL_TABLET | Freq: Three times a day (TID) | ORAL | Status: DC | PRN

## 2013-12-13 NOTE — Progress Notes (Signed)
Patient ambulating better today. Patient able to walk using walker by herself to the bathroom this morning.

## 2013-12-13 NOTE — ED Provider Notes (Signed)
Medical screening examination/treatment/procedure(s) were conducted as a shared visit with non-physician practitioner(s) and myself.  I personally evaluated the patient during the encounter.   EKG Interpretation   Date/Time:  Monday December 10 2013 12:26:47 EST Ventricular Rate:  60 PR Interval:  132 QRS Duration: 86 QT Interval:  463 QTC Calculation: 463 R Axis:   30 Text Interpretation:  Sinus bradycardia Nonspecific ST and T wave  abnormality No significant change since last tracing Confirmed by Maryan Rued   MD, Loree Fee ([id number]) on 12/10/2013 12:31:24 PM     Patient presented to the ER for evaluation of 2 separate episodes of syncope. Her workup in the ER was unremarkable, but there was concern for possible stroke, as the patient reportedly had some facial asymmetry and was having difficulty swallowing. MRI was ordered and the patient admitted by medicine.  Orpah Greek, MD 12/13/13 [phone]

## 2013-12-13 NOTE — Progress Notes (Signed)
Patient ID: Judy [name], female   DOB: 1946/03/17, 68 y.o.   MRN: [id number]  [facility] PROGRESS NOTE  Judy [name] [id number] DOB: 1946-03-21 DOA: 12/10/2013 PCP: Elyn Peers, MD  Brief narrative:  43 y.o. year-old female with history of HTN, HLD, Vertigo, Depression, anxiety, and bioplor disorder who presented to [facility] ED with main concern of sudden episode of passing out one day prior to this admission, while walking through the kitchen, associated with dizziness and lightheadedness. No SOB, chest pain, no abdominal or urinary concerns. As she fell down, she hit the floor with her head.   Active Problems:  Syncope  - appears to be secondary to orthostatic hypotension  - no events on telemetry  - will repeat orthostatic vitals in AM  - stop IVF  - MRI negative for stroke, CE's x 3 negative, 2 D ECHO with normal EF but grade II diastolic dysfunction  - plan to d/c to SNF in am  UTI  - continue empiric Rocephin day #4/5 HTN  - reasonable inpatient control  - continue Losartan  DEPRESSION  - pt with flat affect, continue home medical regimen with Prozac and Risperidone  Bipolar 1 disorder  - stable at this time  Leukocytosis  - possibly related to UTI  - continue Rocephin as noted above  - WBC is trending down and is within normal limits  - will repeat CBC in AM  Right knee pain  - with mild - mod effusion  - pain now improved and MRI with no acute findings  Hypokalemia  - supplement and repeat BMP in AM  Grade Ii diastolic dysfunction  - based on 2 D ECHO done this admission  - daily weights, I's and O's  - weight is 225 lbs this AM and remains stable while inpatient  Dysphagia - swallow evaluation requested   Consultants:  None Procedures/Studies:  Dg Knee Right Port 12/11/2013 No acute fracture deformity. Moderate suprapatellar joint effusion. Moderate medial, mild to mod patellofemoral compartment osteoarthrosis.  Antibiotics:  Rocephin 3/2 -->  3/6  Code Status: Full  Family Communication: Pt at bedside  Disposition Plan: SNF in AM  HPI/Subjective: No events overnight.   Objective: Filed Vitals:   12/13/13 0740 12/13/13 0742 12/13/13 0744 12/13/13 1419  BP: 151/88 154/83 126/71 141/66  Pulse: 65 75 88 65  Temp:    98.2 F (36.8 C)  TempSrc:    Oral  Resp: 18  20 20   Height:      Weight:      SpO2: 98% 98% 98% 100%    Intake/Output Summary (Last 24 hours) at 12/13/13 1704 Last data filed at 12/13/13 1131  Gross per 24 hour  Intake    243 ml  Output      0 ml  Net    243 ml    Exam:   General:  Pt is alert, follows commands appropriately, not in acute distress  Cardiovascular: Regular rate and rhythm, S1/S2, no murmurs, no rubs, no gallops  Respiratory: Clear to auscultation bilaterally, no wheezing, no crackles, no rhonchi  Abdomen: Soft, non tender, non distended, bowel sounds present, no guarding  Extremities: No edema, pulses DP and PT palpable bilaterally  Neuro: Grossly nonfocal  Data Reviewed: Basic Metabolic Panel:  Recent Labs Lab 12/10/13 1230 12/11/13 0540 12/12/13 0448 12/13/13 0430  NA 139 141 142 141  K 3.8 3.2* 3.3* 3.4*  CL 98 104 107 103  CO2 28 25 24 26   GLUCOSE 111* 121*  107* 96  BUN 10 11 11 7   CREATININE 0.98 0.80 0.76 0.69  CALCIUM 10.1 9.2 8.7 9.5   Liver Function Tests:  Recent Labs Lab 12/10/13 1230  AST 20  ALT 14  ALKPHOS 84  BILITOT 0.4  PROT 7.7  ALBUMIN 4.0   CBC:  Recent Labs Lab 12/10/13 1230 12/11/13 0540 12/13/13 0430  WBC 10.8* 7.5 6.3  NEUTROABS 9.3*  --   --   HGB 14.0 11.9* 12.6  HCT 41.2 35.7* 38.2  MCV 88.4 88.1 88.8  PLT 254 235 237   Cardiac Enzymes:  Recent Labs Lab 12/10/13 1811 12/10/13 2350 12/11/13 0540  TROPONINI <0.30 <0.30 <0.30   CBG:  Recent Labs Lab 12/10/13 1235  GLUCAP 96    Recent Results (from the past 240 hour(s))  URINE CULTURE     Status: None   Collection Time    12/10/13  8:01 PM       Result Value Ref Range Status   Specimen Description URINE, CLEAN CATCH   Final   Special Requests NONE   Final   Culture  Setup Time     Final   Value: 12/11/2013 01:44     Performed at [place]     Final   Value: >=100,000 COLONIES/ML     Performed at [facility]   Culture     Final   Value: GROUP B STREP(S.AGALACTIAE)ISOLATED     Note: TESTING AGAINST S. AGALACTIAE NOT ROUTINELY PERFORMED DUE TO PREDICTABILITY OF AMP/PEN/VAN SUSCEPTIBILITY.     Performed at [facility]   Report Status 12/12/2013 FINAL   Final     Scheduled Meds: . cefTRIAXone (ROCEPHIN)  IV  1 g Intravenous Q24H  . docusate sodium  100 mg Oral BID  . enoxaparin (LOVENOX) injection  40 mg Subcutaneous Q24H  . FLUoxetine  40 mg Oral Daily  . losartan  100 mg Oral Daily  . risperiDONE  0.5 mg Oral QHS  . senna  1 tablet Oral BID  . sodium chloride  3 mL Intravenous Q12H   Continuous Infusions:   Faye Ramsay, MD  [facility] Pager [phone]  If 7PM-7AM, please contact night-coverage [email or web address] Password [email or web address] 12/13/2013, 5:04 PM   LOS: 3 days

## 2013-12-13 NOTE — Evaluation (Signed)
Clinical/Bedside Swallow Evaluation Patient Details  Name: Judy [name] MRN: [id number] Date of Birth: 1946-09-28  Today's Date: 12/13/2013 Time: [id number] SLP Time Calculation (min): 45 min  Past Medical History:  Past Medical History  Diagnosis Date  . Hypertension   . Vertigo   . Arthritis   . Hyperlipidemia   . Allergic rhinitis   . Hearing loss in left ear     since birth  . Depression   . Anxiety   . Bipolar 1 disorder   . Obesity    Past Surgical History:  Past Surgical History  Procedure Laterality Date  . Dilation and curettage of uterus  1960's  . Colonoscopy  08/25/2011    Procedure: COLONOSCOPY;  Surgeon: Landry Dyke, MD;  Location: [facility];  Service: Endoscopy;  Laterality: N/A;   HPI:  68 yo female adm to [facility] after fall,  PMH + for bipolar disorder, Syncope, likely related to orthostatic hypotension ? some vertigo related to recent URI, HTN, HLD.  Pt Head MRI negative, Chest XRay negative.  Pt reports sudden onset of dysphagia since her fall with sensation of food/liquid stasis in pharynx.  She has h/o allergic rhinitis in chart but denies significant issue.  Pt reports issues with secretions sticking in throat without ability to clear.  She is eating 100% of meals being sent.     Assessment / Plan / Recommendation Clinical Impression  Pt's dysphagia symptoms appear consistent with possible LPR vs pharyngeal deficits from Tardive Dyskinesia.  No focal CN deficits noted but tremorous type movement apparent that may be consistent with Tardive Dyskinesia from Risperdal.  Question source given sudden onset of dysphagia.   No clinical indications of aspiration or airway compromise observed with meal.  Pt effectively compensating for her dysphagia symptoms by swallowing liquid during meals and dry swallowing.   Infrequent belching observed during meal as well.  Do not suspect primary oropharyngeal issue - provided compensation strategies to mitigate dysphagia  symptoms.    Defer to MD if follow up testing indicated vs medical management of possible source of dysphagia.      Aspiration Risk  Mild    Diet Recommendation Regular;Thin liquid   Liquid Administration via: Cup;Straw Medication Administration: Whole meds with liquid Supervision: Patient able to self feed;Intermittent supervision to cue for compensatory strategies Compensations: Multiple dry swallows after each bite/sip;Follow solids with liquid Postural Changes and/or Swallow Maneuvers: Seated upright 90 degrees;Upright 30-60 min after meal    Other  Recommendations Recommended Consults: Consider esophageal assessment Oral Care Recommendations: Oral care BID   Follow Up Recommendations  None    Frequency and Duration        Pertinent Vitals/Pain Afebrile, decreased     Swallow Study Prior Functional Status   very occasional reflux symptoms prior to admit per pt    General Date of Onset: 12/13/13 HPI: 68 yo female adm to [facility] after fall,  PMH + for bipolar disorder, Syncope, likely related to orthostatic hypotension ? some vertigo related to recent URI, HTN, HLD.  Pt Head MRI negative, Chest XRay negative.  Pt reports sudden onset of dysphagia since her fall with sensation of food/liquid stasis in pharynx.  She has h/o allergic rhinitis in chart but denies significant issue.  Pt reports issues with secretions sticking in throat without ability to clear.  She is eating 100% of meals being sent.   Type of Study: Bedside swallow evaluation Diet Prior to this Study: Regular;Thin liquids Temperature Spikes Noted: No Respiratory Status:  Room air History of Recent Intubation: No Behavior/Cognition: Alert;Cooperative;Pleasant mood Oral Cavity - Dentition: Adequate natural dentition Self-Feeding Abilities: Able to feed self Patient Positioning: Upright in chair Baseline Vocal Quality: Clear Volitional Cough: Strong Volitional Swallow: Able to elicit    Oral/Motor/Sensory Function  Overall Oral Motor/Sensory Function: Appears within functional limits for tasks assessed   Ice Chips Ice chips: Not tested   Thin Liquid Thin Liquid: Impaired Oral Phase Functional Implications: Prolonged oral transit Pharyngeal  Phase Impairments: Multiple swallows    Nectar Thick Nectar Thick Liquid: Not tested   Honey Thick Honey Thick Liquid: Not tested   Puree Puree: Within functional limits Presentation: Self Fed;Spoon   Solid   GO    Solid: Within functional limits Presentation: Self Fed;Spoon Other Comments: globus sensation noted       [name], [facility] SLP [phone]

## 2013-12-13 NOTE — Progress Notes (Signed)
Physical Therapy Treatment Patient Details Name: Judy [name] MRN: [id number] DOB: Dec 24, 1945 Today's Date: 12/13/2013 Time: [id number] PT Time Calculation (min): 25 min  PT Assessment / Plan / Recommendation  History of Present Illness 68 yo female admitted with syncope, dizziness, fall. Hx of veritgo.Pt lives alone.    PT Comments   Pt feeling "alittle better".  Assisted OOB to amb in hallway twice with one sitting rest break.   Pt progressing but not at a functional level to return home alone.  Pt has a sister who staes can help but won't be there 24/7.  Follow Up Recommendations  SNF (pt not agreeing)     Does the patient have the potential to tolerate intense rehabilitation     Barriers to Discharge        Equipment Recommendations  Rolling walker with 5" wheels    Recommendations for Other Services    Frequency Min 3X/week   Progress towards PT Goals Progress towards PT goals: Progressing toward goals  Plan      Precautions / Restrictions Precautions Precautions: Fall Restrictions Weight Bearing Restrictions: No   Pertinent Vitals/Pain     Mobility  Bed Mobility Overal bed mobility: Needs Assistance Supine to sit: HOB elevated;Modified independent (Device/Increase time) General bed mobility comments: Pt denied dizziness while getting to EOB.  Transfers Overall transfer level: Needs assistance Equipment used: Rolling walker (2 wheeled) Sit to Stand: From elevated surface;Min assist General transfer comment: Assist to rise, stabilize, control descent. VCs safety, technique, hand placement. Pt denied dizziness during static standing.  Ambulation/Gait Ambulation/Gait assistance: Min guard;Min assist Ambulation Distance (Feet): 90 Feet (45 feet x 2 one sitting rest break) Assistive device: Rolling walker (2 wheeled) Gait Pattern/deviations: Step-through pattern;Decreased stride length Gait velocity: decreased General Gait Details: slow gait.  Limited activity  tolerance.  Fatigues quickly.     PT Goals (current goals can now be found in the care plan section)    Visit Information  Last PT Received On: 12/13/13 Assistance Needed: +1 History of Present Illness: 68 yo female admitted with syncope, dizziness, fall. Hx of veritgo.Pt lives alone.     Subjective Data      Cognition       Balance     End of Session PT - End of Session Equipment Utilized During Treatment: Gait belt Activity Tolerance: Patient limited by fatigue Patient left: in bed;with call bell/phone within reach   Rica Koyanagi  PTA [facility]  Acute  Rehab Pager      [phone]

## 2013-12-13 NOTE — Progress Notes (Signed)
Clinical Social Work Department BRIEF PSYCHOSOCIAL ASSESSMENT 12/13/2013  Patient:  Judy [name], Judy [name]     Account Number:  [id number]     Admit date:  12/10/2013  Clinical Social Worker:  Renold Genta  Date/Time:  12/13/2013 04:25 PM  Referred by:  Physician  Date Referred:  12/13/2013 Referred for  SNF Placement   Other Referral:   Interview type:  Patient Other interview type:   and sister at bedside    PSYCHOSOCIAL DATA Living Status:  ALONE Admitted from facility:   Level of care:   Primary support name:  Lauris Chroman (sister) ph#: [phone] Primary support relationship to patient:  SIBLING Degree of support available:   good    CURRENT CONCERNS Current Concerns  Post-Acute Placement   Other Concerns:    SOCIAL WORK ASSESSMENT / PLAN CSW received consult from [facility] that patient is now interested in SNF placement.   Assessment/plan status:  Information/Referral to [employer] Other assessment/ plan:   Information/referral to community resources:   CSW completed FL2 and faxed information out to [facility] - provided bed offers to patient.    PATIENT'S/FAMILY'S RESPONSE TO PLAN OF CARE: Patient states that she would like [facility], CSW confirmed with Crystal @ [facility] that they would be able to take patient tomorrow.         Raynaldo Opitz, [facility] Clinical Social Worker cell #: [phone]

## 2013-12-13 NOTE — Progress Notes (Signed)
Clinical Social Work Department CLINICAL SOCIAL WORK PLACEMENT NOTE 12/13/2013  Patient:  Judy [name], Judy [name]  Account Number:  [id number] Admit date:  12/10/2013  Clinical Social Worker:  Renold Genta  Date/time:  12/13/2013 04:30 PM  Clinical Social Work is seeking post-discharge placement for this patient at the following level of care:   SKILLED NURSING   (*CSW will update this form in Epic as items are completed)   12/13/2013  Patient/family provided with [place] Department of Clinical Social Work's list of facilities offering this level of care within the geographic area requested by the patient (or if unable, by the patient's family).  12/13/2013  Patient/family informed of their freedom to choose among providers that offer the needed level of care, that participate in Medicare, Medicaid or managed care program needed by the patient, have an available bed and are willing to accept the patient.  12/13/2013  Patient/family informed of [facility]' ownership interest in [facility], as well as of the fact that they are under no obligation to receive care at this facility.  PASARR submitted to EDS on 12/13/2013 PASARR number received from EDS on 12/13/2013  FL2 transmitted to all facilities in geographic area requested by pt/family on  12/13/2013 FL2 transmitted to all facilities within larger geographic area on   Patient informed that his/her managed care company has contracts with or will negotiate with  certain facilities, including the following:     Patient/family informed of bed offers received:  12/13/2013 Patient chooses bed at [facility] Physician recommends and patient chooses bed at    Patient to be transferred to [place] on   Patient to be transferred to facility by   The following physician request were entered in Epic:   Additional Comments:   Raynaldo Opitz, [place] Worker cell #:  [phone]

## 2013-12-13 NOTE — Discharge Instructions (Signed)
Osteoarthritis Osteoarthritis is a disease that causes soreness and swelling (inflammation) of a joint. It occurs when the cartilage at the affected joint wears down. Cartilage acts as a cushion, covering the ends of bones where they meet to form a joint. Osteoarthritis is the most common form of arthritis. It often occurs in older people. The joints affected most often by this condition include those in the:  Ends of the fingers.  Thumbs.  Neck.  Lower back.  Knees.  Hips. CAUSES  Over time, the cartilage that covers the ends of bones begins to wear away. This causes bone to rub on bone, producing pain and stiffness in the affected joints.  RISK FACTORS Certain factors can increase your chances of having osteoarthritis, including:  Older age.  Excessive body weight.  Overuse of joints. SIGNS AND SYMPTOMS   Pain, swelling, and stiffness in the joint.  Over time, the joint may lose its normal shape.  Small deposits of bone (osteophytes) may grow on the edges of the joint.  Bits of bone or cartilage can break off and float inside the joint space. This may cause more pain and damage. DIAGNOSIS  Your health care provider will do a physical exam and ask about your symptoms. Various tests may be ordered, such as:  X-rays of the affected joint.  An MRI scan.  Blood tests to rule out other types of arthritis.  Joint fluid tests. This involves using a needle to draw fluid from the joint and examining the fluid under a microscope. TREATMENT  Goals of treatment are to control pain and improve joint function. Treatment plans may include:  A prescribed exercise program that allows for rest and joint relief.  A weight control plan.  Pain relief techniques, such as:  Properly applied heat and cold.  Electric pulses delivered to nerve endings under the skin (transcutaneous electrical nerve stimulation, TENS).  Massage.  Certain nutritional supplements.  Medicines to  control pain, such as:  Acetaminophen.  Nonsteroidal anti-inflammatory drugs (NSAIDs), such as naproxen.  Narcotic or central-acting agents, such as tramadol.  Corticosteroids. These can be given orally or as an injection.  Surgery to reposition the bones and relieve pain (osteotomy) or to remove loose pieces of bone and cartilage. Joint replacement may be needed in advanced states of osteoarthritis. HOME CARE INSTRUCTIONS   Only take over-the-counter or prescription medicines as directed by your health care provider. Take all medicines exactly as instructed.  Maintain a healthy weight. Follow your health care provider's instructions for weight control. This may include dietary instructions.  Exercise as directed. Your health care provider can recommend specific types of exercise. These may include:  Strengthening exercises These are done to strengthen the muscles that support joints affected by arthritis. They can be performed with weights or with exercise bands to add resistance.  Aerobic activities These are exercises, such as brisk walking or low-impact aerobics, that get your heart pumping.  Range-of-motion activities These keep your joints limber.  Balance and agility exercises These help you maintain daily living skills.  Rest your affected joints as directed by your health care provider.  Follow up with your health care provider as directed. SEEK MEDICAL CARE IF:   Your skin turns red.  You develop a rash in addition to your joint pain.  You have worsening joint pain. SEEK IMMEDIATE MEDICAL CARE IF:  You have a significant loss of weight or appetite.  You have a fever along with joint or muscle aches.  You have   night sweats. FOR MORE INFORMATION  National Institute of Arthritis and Musculoskeletal and Skin Diseases: www.niams.nih.gov National Institute on Aging: www.nia.nih.gov American College of Rheumatology: www.rheumatology.org Document Released: 09/27/2005  Document Revised: 07/18/2013 Document Reviewed: 06/04/2013 ExitCare Patient Information 2014 [employer].  

## 2013-12-14 LAB — BASIC METABOLIC PANEL
BUN: 8 mg/dL (ref 6–23)
CHLORIDE: 102 meq/L (ref 96–112)
CO2: 28 meq/L (ref 19–32)
CREATININE: 0.86 mg/dL (ref 0.50–1.10)
Calcium: 9.6 mg/dL (ref 8.4–10.5)
GFR calc non Af Amer: 69 mL/min — ABNORMAL LOW (ref >90)
GFR, EST AFRICAN AMERICAN: 80 mL/min — AB (ref >90)
Glucose, Bld: 101 mg/dL — ABNORMAL HIGH (ref 70–99)
POTASSIUM: 3.5 meq/L — AB (ref 3.7–5.3)
Sodium: 142 mEq/L (ref 137–147)

## 2013-12-14 LAB — CBC
HEMATOCRIT: 35.2 % — AB (ref 36.0–46.0)
Hemoglobin: 11.7 g/dL — ABNORMAL LOW (ref 12.0–15.0)
MCH: 29.4 pg (ref 26.0–34.0)
MCHC: 33.2 g/dL (ref 30.0–36.0)
MCV: 88.4 fL (ref 78.0–100.0)
PLATELETS: 242 10*3/uL (ref 150–400)
RBC: 3.98 MIL/uL (ref 3.87–5.11)
RDW: 14.4 % (ref 11.5–15.5)
WBC: 4.6 10*3/uL (ref 4.0–10.5)

## 2013-12-14 MED ORDER — POTASSIUM CHLORIDE CRYS ER 20 MEQ PO TBCR
40.0000 meq | EXTENDED_RELEASE_TABLET | Freq: Once | ORAL | Status: AC
  Administered 2013-12-14: 40 meq via ORAL
  Filled 2013-12-14: qty 2

## 2013-12-14 NOTE — Progress Notes (Signed)
Called report to Nicole Kindred, Therapist, sports at [facility]. Patient ready for discharge. J.Abdullah Rizzi,RN

## 2013-12-14 NOTE — Discharge Summary (Signed)
Physician Discharge Summary  Judy [name] [id number] DOB: 07/09/1946 DOA: 12/10/2013  PCP: Elyn Peers, MD  Admit date: 12/10/2013 Discharge date: 12/14/2013  Recommendations for Outpatient Follow-up:  1. Pt will need to follow up with PCP in 2-3 weeks post discharge 2. Please obtain BMP to evaluate electrolytes and kidney function, potassium level 3. Please also check CBC to evaluate Hg and Hct levels 4. Please note that pt has received one dose of K-dur 40 MEQ prior to discharge   Discharge Diagnoses: Orthostatic hypotension, fall  Active Problems:   HYPERCHOLESTEROLEMIA   DEPRESSION   HYPERTENSION   Syncope   Bipolar 1 disorder   Obesity   Vertigo   Leukocytosis   URI (upper respiratory infection)  Discharge Condition: Stable  Diet recommendation: Heart healthy diet discussed in details   Brief narrative:  68 y.o. year-old female with history of HTN, HLD, Vertigo, Depression, anxiety, and bioplor disorder who presented to [facility] ED with main concern of sudden episode of passing out one day prior to this admission, while walking through the kitchen, associated with dizziness and lightheadedness. No SOB, chest pain, no abdominal or urinary concerns. As she fell down, she hit the floor with her head.   Active Problems:  Syncope  - appears to be secondary to orthostatic hypotension  - no events on telemetry  - MRI negative for stroke, CE's x 3 negative, 2 D ECHO with normal EF but grade II diastolic dysfunction  - plan to d/c to SNF today for further rehabilitation  UTI  - continue empiric Rocephin day #5/5 - no need for continuing ABX upon discharge   HTN  - reasonable inpatient control  - continue Losartan  DEPRESSION  - pt with flat affect, continue home medical regimen with Prozac and Risperidone  Bipolar 1 disorder  - stable at this time  Leukocytosis  - possibly related to UTI  - continue Rocephin as noted above and stop after today's dose  - WBC is trending  down and is within normal limits  Right knee pain  - with mild - mod effusion  - pain now improved and MRI with no acute findings  Hypokalemia  - supplement ed, will need repeat BMP in an outpatient setting  Grade Ii diastolic dysfunction  - based on 2 D ECHO done this admission  - daily weights, I's and O's  - weight is 226 lbs this AM and remains stable while inpatient  Dysphagia  - swallow evaluation requested and an outpatient evaluation reasonable  - pt advised to schedule an appointment with her GI specialist   Consultants:  None Procedures/Studies:  Dg Knee Right Port 12/11/2013 No acute fracture deformity. Moderate suprapatellar joint effusion. Moderate medial, mild to mod patellofemoral compartment osteoarthrosis.  Antibiotics:  Rocephin 3/2 --> 3/6  Code Status: Full  Family Communication: Pt at bedside  Disposition Plan: SNF   Discharge Exam: Filed Vitals:   12/14/13 0521  BP: 146/73  Pulse: 71  Temp: 98.1 F (36.7 C)  Resp: 20   Filed Vitals:   12/13/13 0744 12/13/13 1419 12/13/13 2027 12/14/13 0521  BP: 126/71 141/66 150/75 146/73  Pulse: 88 65 72 71  Temp:  98.2 F (36.8 C) 98.1 F (36.7 C) 98.1 F (36.7 C)  TempSrc:  Oral Oral Oral  Resp: 20 20 20 20   Height:      Weight:    102.7 kg (226 lb 6.6 oz)  SpO2: 98% 100% 99% 99%    General: Pt is alert,  follows commands appropriately, not in acute distress Cardiovascular: Regular rate and rhythm, S1/S2 +, no murmurs, no rubs, no gallops Respiratory: Clear to auscultation bilaterally, no wheezing, no crackles, no rhonchi Abdominal: Soft, non tender, non distended, bowel sounds +, no guarding Extremities: no edema, no cyanosis, pulses palpable bilaterally DP and PT Neuro: Grossly nonfocal  Discharge Instructions  Discharge Orders   Future Orders Complete By Expires   Diet - low sodium heart healthy  As directed    Increase activity slowly  As directed        Medication List         FLUoxetine  20 MG tablet  Commonly known as:  PROZAC  Take 40 mg by mouth daily.     losartan-hydrochlorothiazide 100-25 MG per tablet  Commonly known as:  HYZAAR  Take 1 tablet by mouth daily.     meclizine 12.5 MG tablet  Commonly known as:  ANTIVERT  Take 1 tablet (12.5 mg total) by mouth 3 (three) times daily as needed for dizziness or nausea.     risperiDONE 0.5 MG tablet  Commonly known as:  RISPERDAL  Take 0.5 mg by mouth at bedtime.     traMADol 50 MG tablet  Commonly known as:  ULTRAM  Take 1 tablet (50 mg total) by mouth every 6 (six) hours as needed for moderate pain.           Follow-up Information   Schedule an appointment as soon as possible for a visit with Elyn Peers, MD.   Specialty:  Family Medicine   Contact information:   [address] [place] [address] [phone]       Follow up with Faye Ramsay, MD. (As needed, If symptoms worsen)    Specialty:  Internal Medicine   Contact information:   [address] [address] [phone]       Schedule an appointment as soon as possible for a visit with Landry Dyke, MD.   Specialty:  Gastroenterology   Contact information:   [address] [place]  [address] [phone]        The results of significant diagnostics from this hospitalization (including imaging, microbiology, ancillary and laboratory) are listed below for reference.     Microbiology: Recent Results (from the past 240 hour(s))  URINE CULTURE     Status: None   Collection Time    12/10/13  8:01 PM      Result Value Ref Range Status   Specimen Description URINE, CLEAN CATCH   Final   Special Requests NONE   Final   Culture  Setup Time     Final   Value: 12/11/2013 01:44     Performed at [facility]     Final   Value: >=100,000 COLONIES/ML     Performed at [facility]   Culture     Final   Value: GROUP B STREP(S.AGALACTIAE)ISOLATED     Note:  TESTING AGAINST S. AGALACTIAE NOT ROUTINELY PERFORMED DUE TO PREDICTABILITY OF AMP/PEN/VAN SUSCEPTIBILITY.     Performed at [facility]   Report Status 12/12/2013 FINAL   Final     Labs: Basic Metabolic Panel:  Recent Labs Lab 12/10/13 1230 12/11/13 0540 12/12/13 0448 12/13/13 0430 12/14/13 0400  NA 139 141 142 141 142  K 3.8 3.2* 3.3* 3.4* 3.5*  CL 98 104 107 103 102  CO2 28 25 24 26 28   GLUCOSE 111*  121* 107* 96 101*  BUN 10 11 11 7 8   CREATININE 0.98 0.80 0.76 0.69 0.86  CALCIUM 10.1 9.2 8.7 9.5 9.6   Liver Function Tests:  Recent Labs Lab 12/10/13 1230  AST 20  ALT 14  ALKPHOS 84  BILITOT 0.4  PROT 7.7  ALBUMIN 4.0   CBC:  Recent Labs Lab 12/10/13 1230 12/11/13 0540 12/13/13 0430 12/14/13 0400  WBC 10.8* 7.5 6.3 4.6  NEUTROABS 9.3*  --   --   --   HGB 14.0 11.9* 12.6 11.7*  HCT 41.2 35.7* 38.2 35.2*  MCV 88.4 88.1 88.8 88.4  PLT 254 235 237 242   Cardiac Enzymes:  Recent Labs Lab 12/10/13 1811 12/10/13 2350 12/11/13 0540  TROPONINI <0.30 <0.30 <0.30   BNP: BNP (last 3 results) No results found for this basename: PROBNP,  in the last 8760 hours CBG:  Recent Labs Lab 12/10/13 1235  GLUCAP 96   SIGNED: Time coordinating discharge: Over 30 minutes  Faye Ramsay, MD  [facility] 12/14/2013, 11:19 AM Pager [phone]  If 7PM-7AM, please contact night-coverage [email or web address] Password [email or web address]

## 2013-12-14 NOTE — Progress Notes (Signed)
Patient is set to discharge to [facility] SNF today. Patient & sister aware. Discharge packet in [place], [place] aware. PTAR scheduled for 2:00pm pickup (Service Request Id: [id number]).   Clinical Social Work Department CLINICAL SOCIAL WORK PLACEMENT NOTE 12/14/2013  Patient:  Judy [name], Judy [name]  Account Number:  [id number] Admit date:  12/10/2013  Clinical Social Worker:  Renold Genta  Date/time:  12/13/2013 04:30 PM  Clinical Social Work is seeking post-discharge placement for this patient at the following level of care:   SKILLED NURSING   (*CSW will update this form in Epic as items are completed)   12/13/2013  Patient/family provided with [place] Department of Clinical Social Work's list of facilities offering this level of care within the geographic area requested by the patient (or if unable, by the patient's family).  12/13/2013  Patient/family informed of their freedom to choose among providers that offer the needed level of care, that participate in Medicare, Medicaid or managed care program needed by the patient, have an available bed and are willing to accept the patient.  12/13/2013  Patient/family informed of [facility]' ownership interest in [facility], as well as of the fact that they are under no obligation to receive care at this facility.  PASARR submitted to EDS on 12/13/2013 PASARR number received from EDS on 12/13/2013  FL2 transmitted to all facilities in geographic area requested by pt/family on  12/13/2013 FL2 transmitted to all facilities within larger geographic area on   Patient informed that his/her managed care company has contracts with or will negotiate with  certain facilities, including the following:     Patient/family informed of bed offers received:  12/13/2013 Patient chooses bed at [facility] Physician recommends and patient chooses bed at    Patient to be transferred to [place] on  12/14/2013 Patient to be  transferred to facility by PTAR  The following physician request were entered in Epic:   Additional Comments:   Raynaldo Opitz, [place] Worker cell #: [phone]

## 2013-12-18 ENCOUNTER — Non-Acute Institutional Stay (SKILLED_NURSING_FACILITY): Payer: Medicare Other | Admitting: Adult Health

## 2013-12-18 DIAGNOSIS — I10 Essential (primary) hypertension: Secondary | ICD-10-CM

## 2013-12-18 DIAGNOSIS — K219 Gastro-esophageal reflux disease without esophagitis: Secondary | ICD-10-CM

## 2013-12-18 DIAGNOSIS — R55 Syncope and collapse: Secondary | ICD-10-CM

## 2013-12-18 DIAGNOSIS — K5900 Constipation, unspecified: Secondary | ICD-10-CM

## 2013-12-18 DIAGNOSIS — R42 Dizziness and giddiness: Secondary | ICD-10-CM

## 2013-12-18 DIAGNOSIS — F319 Bipolar disorder, unspecified: Secondary | ICD-10-CM

## 2013-12-18 DIAGNOSIS — F329 Major depressive disorder, single episode, unspecified: Secondary | ICD-10-CM

## 2013-12-18 DIAGNOSIS — F3289 Other specified depressive episodes: Secondary | ICD-10-CM

## 2013-12-19 ENCOUNTER — Encounter: Payer: Self-pay | Admitting: Nurse Practitioner

## 2013-12-20 ENCOUNTER — Encounter: Payer: Self-pay | Admitting: *Deleted

## 2013-12-21 ENCOUNTER — Non-Acute Institutional Stay (SKILLED_NURSING_FACILITY): Payer: Medicare Other | Admitting: Internal Medicine

## 2013-12-21 DIAGNOSIS — F329 Major depressive disorder, single episode, unspecified: Secondary | ICD-10-CM

## 2013-12-21 DIAGNOSIS — R5383 Other fatigue: Secondary | ICD-10-CM

## 2013-12-21 DIAGNOSIS — R5381 Other malaise: Secondary | ICD-10-CM

## 2013-12-21 DIAGNOSIS — K219 Gastro-esophageal reflux disease without esophagitis: Secondary | ICD-10-CM | POA: Insufficient documentation

## 2013-12-21 DIAGNOSIS — H8110 Benign paroxysmal vertigo, unspecified ear: Secondary | ICD-10-CM

## 2013-12-21 DIAGNOSIS — K5900 Constipation, unspecified: Secondary | ICD-10-CM | POA: Insufficient documentation

## 2013-12-21 DIAGNOSIS — F32A Depression, unspecified: Secondary | ICD-10-CM

## 2013-12-21 DIAGNOSIS — R531 Weakness: Secondary | ICD-10-CM

## 2013-12-21 DIAGNOSIS — R1319 Other dysphagia: Secondary | ICD-10-CM

## 2013-12-21 DIAGNOSIS — F3289 Other specified depressive episodes: Secondary | ICD-10-CM

## 2013-12-21 DIAGNOSIS — I10 Essential (primary) hypertension: Secondary | ICD-10-CM

## 2013-12-21 MED ORDER — POLYETHYLENE GLYCOL 3350 17 GM/SCOOP PO POWD: 17.0000 g | Freq: Every day | ORAL | Status: DC

## 2013-12-21 MED ORDER — PANTOPRAZOLE SODIUM 40 MG PO TBEC: 40.0000 mg | DELAYED_RELEASE_TABLET | Freq: Every day | ORAL | Status: DC

## 2013-12-21 NOTE — Progress Notes (Signed)
Patient ID: Judy [name], female   DOB: 08/07/1946, 68 y.o.   MRN: [id number]     [facility]  No Known Allergies   Chief Complaint  Patient presents with  . Hospitalization Follow-up    HPI:  She had been hospitalized following a fall at home with syncope. She was worked up in the hospital and the syncope episode  was felt to be related to orthostatic  hypotension. She is here for short term rehab with her goal to return back home.     Past Medical History  Diagnosis Date  . Hypertension   . Vertigo   . Arthritis   . Hyperlipidemia   . Allergic rhinitis   . Hearing loss in left ear     since birth  . Depression   . Anxiety   . Bipolar 1 disorder   . Obesity   . Diverticulosis of colon (without mention of hemorrhage) 08/25/2011    [name]    Past Surgical History  Procedure Laterality Date  . Dilation and curettage of uterus  1960's  . Colonoscopy  08/25/2011    Procedure: COLONOSCOPY;  Surgeon: Landry Dyke, MD;  Location: [facility];  Service: Endoscopy;  Laterality: N/A;    VITAL SIGNS BP 118/66  Pulse 72  Ht 5\' 10"  (1.778 m)  Wt 225 lb (102.059 kg)  BMI 32.28 kg/m2   Patient's Medications  New Prescriptions   No medications on file  Previous Medications   FLUOXETINE (PROZAC) 20 MG TABLET    Take 40 mg by mouth daily.   LOSARTAN-HYDROCHLOROTHIAZIDE (HYZAAR) 100-25 MG PER TABLET    Take 1 tablet by mouth daily.   MECLIZINE (ANTIVERT) 12.5 MG TABLET    Take 1 tablet (12.5 mg total) by mouth 3 (three) times daily as needed for dizziness or nausea.   RISPERIDONE (RISPERDAL) 0.5 MG TABLET    Take 0.5 mg by mouth at bedtime.   TRAMADOL (ULTRAM) 50 MG TABLET    Take 1 tablet (50 mg total) by mouth every 6 (six) hours as needed for moderate pain.  Modified Medications   No medications on file  Discontinued Medications   No medications on file    SIGNIFICANT DIAGNOSTIC EXAMS  12-10-13: ct of head: No acute intracranial injury. Mild age-related  involutional change stable from prior study.   12-10-13: chest x-ray: 1. No radiographic evidence of acute cardiopulmonary disease.  12-10-13: mri of head: 1. No acute intracranial abnormality. Stable small right scalp hematoma. 2. Moderate for age signal changes in the brain, nonspecific but favor chronic small vessel disease related.   12-11-13: 2-d echo: Left ventricle: The cavity size was normal. Systolic function was normal. The estimated ejection fraction was in the range of 55% to 60%. Wall motion was normal; there were no regional wall motion abnormalities. There was an increased relative contribution of atrial contraction to ventricular filling. Features are consistent with a pseudonormal left ventricular filling pattern, with concomitant abnormal relaxation and increased filling pressure (grade 2 diastolic dysfunction). - Left atrium: The atrium was mildly dilated.  12-11-13: right knee x-ray: No acute fracture deformity or dislocation. Moderate suprapatellar joint effusion. Moderate medial, mild to moderate patellofemoral compartment osteoarthrosis.   12-13-13: mri right knee: No acute finding.  Negative for meniscal or ligament tear. Mild to moderate degenerative change most notable in the patellofemoral compartment. Baker's cyst.   LABS REVIEWED:  12-10-13: wbc 10.8; hgb 14.0; hct 41.2; mcv 88.4. plt 254; glucose 111; bun 10; creat 0.98;  k+3.8; na++139; liver normal albumin 4.8; hgb a1c 6.1; tsh 0.564 12-14-13: wbc 4.6; hgb 11.7; hct 35.2; mcv 88.4; plt 242; glucose 101; bun 8; creat 0.86; k+3.5; na++142      Review of Systems  Constitutional: Negative for malaise/fatigue.  Eyes: Negative for blurred vision.  Respiratory: Negative for cough and shortness of breath.   Cardiovascular: Negative for chest pain, palpitations and leg swelling.  Gastrointestinal: Positive for heartburn and constipation. Negative for abdominal pain.  Genitourinary: Negative for dysuria.  Musculoskeletal:  Negative for joint pain and myalgias.  Skin: Negative.   Neurological: Negative for dizziness, weakness and headaches.  Psychiatric/Behavioral: Negative for depression. The patient is not nervous/anxious.     Physical Exam  Constitutional: She is oriented to person, place, and time. She appears well-developed and well-nourished. No distress.  obese  Neck: Neck supple. No JVD present. No thyromegaly present.  Cardiovascular: Normal rate, regular rhythm and intact distal pulses.   Respiratory: Effort normal and breath sounds normal. No respiratory distress. She has no wheezes.  GI: Soft. Bowel sounds are normal. She exhibits no distension. There is no tenderness.  Musculoskeletal: Normal range of motion. She exhibits no edema.  Neurological: She is alert and oriented to person, place, and time. No cranial nerve deficit.  Skin: Skin is warm and dry. She is not diaphoretic.  Psychiatric: She has a normal mood and affect.      ASSESSMENT/ PLAN:  1. Hypertension: she is stable will continue her hyzaar 100/25 mg dialy and will continue to monitor her status   2. Vertigo: no complaints at the present time; will continue antivert 12.5 mg tid prn and will monitor   3. Bipolar disease: she is emotionally stable will continue prozac 40 mg daily and risperdal 0.5 mg nightly and iwll continue to monitor her status.   4. Syncope: no further episodes present; will begin orthostatic vital signs daily and iwll monitor  5. Gerd: for her increased belching will begin protonix 40 mg daily and will monitor   6. Constipation: will begin miralax daily    Time spent with patient 50 minutes.      Ok Edwards NP [facility]  Contact [phone] Monday through Friday 8am- 5pm  After hours call [phone]

## 2013-12-25 ENCOUNTER — Encounter: Payer: Self-pay | Admitting: Adult Health

## 2013-12-25 ENCOUNTER — Non-Acute Institutional Stay (SKILLED_NURSING_FACILITY): Payer: Medicare Other | Admitting: Adult Health

## 2013-12-25 DIAGNOSIS — I10 Essential (primary) hypertension: Secondary | ICD-10-CM

## 2013-12-25 DIAGNOSIS — R42 Dizziness and giddiness: Secondary | ICD-10-CM

## 2013-12-25 NOTE — Progress Notes (Signed)
Patient ID: Judy [name], female   DOB: 06/30/1946, 68 y.o.   MRN: [id number]     No Known Allergies   Chief Complaint  Patient presents with  . Acute Visit    vertigo    HPI:  The nursing staff is concerned that she is having orthostatic hypotension. Laying 122/80; sitting 98/70; standing 108/78. She is not dizzy all the time. The vertigo does interfere with her ability to participate in therapy at times. She is denying any chest pain present.     Past Medical History  Diagnosis Date  . Hypertension   . Vertigo   . Arthritis   . Hyperlipidemia   . Allergic rhinitis   . Hearing loss in left ear     since birth  . Depression   . Anxiety   . Bipolar 1 disorder   . Obesity   . Diverticulosis of colon (without mention of hemorrhage) 08/25/2011    [name]    Past Surgical History  Procedure Laterality Date  . Dilation and curettage of uterus  1960's  . Colonoscopy  08/25/2011    Procedure: COLONOSCOPY;  Surgeon: Landry Dyke, MD;  Location: [facility];  Service: Endoscopy;  Laterality: N/A;    VITAL SIGNS BP 122/80  Pulse 84  Ht 5\' 10"  (1.778 m)  Wt 225 lb 9.6 oz (102.331 kg)  BMI 32.37 kg/m2   Patient's Medications  New Prescriptions   No medications on file  Previous Medications   FLUOXETINE (PROZAC) 20 MG TABLET    Take 40 mg by mouth daily.   LOSARTAN-HYDROCHLOROTHIAZIDE (HYZAAR) 100-25 MG PER TABLET    Take 1 tablet by mouth daily.   PANTOPRAZOLE (PROTONIX) 40 MG TABLET    Take 1 tablet (40 mg total) by mouth daily.   POLYETHYLENE GLYCOL POWDER (GLYCOLAX/MIRALAX) POWDER    Take 17 g by mouth daily.   RISPERIDONE (RISPERDAL) 0.5 MG TABLET    Take 0.5 mg by mouth at bedtime.   TRAMADOL (ULTRAM) 50 MG TABLET    Take 1 tablet (50 mg total) by mouth every 6 (six) hours as needed for moderate pain.  Modified Medications   Modified Medication Previous Medication   MECLIZINE (ANTIVERT) 12.5 MG TABLET meclizine (ANTIVERT) 12.5 MG tablet      Take 25 mg  by mouth 2 (two) times daily.    Take 1 tablet (12.5 mg total) by mouth 3 (three) times daily as needed for dizziness or nausea.  Discontinued Medications   No medications on file    SIGNIFICANT DIAGNOSTIC EXAMS  12-10-13: ct of head: No acute intracranial injury. Mild age-related involutional change stable from prior study.   12-10-13: chest x-ray: 1. No radiographic evidence of acute cardiopulmonary disease.  12-10-13: mri of head: 1. No acute intracranial abnormality. Stable small right scalp hematoma. 2. Moderate for age signal changes in the brain, nonspecific but favor chronic small vessel disease related.   12-11-13: 2-d echo: Left ventricle: The cavity size was normal. Systolic function was normal. The estimated ejection fraction was in the range of 55% to 60%. Wall motion was normal; there were no regional wall motion abnormalities. There was an increased relative contribution of atrial contraction to ventricular filling. Features are consistent with a pseudonormal left ventricular filling pattern, with concomitant abnormal relaxation and increased filling pressure (grade 2 diastolic dysfunction). - Left atrium: The atrium was mildly dilated.  12-11-13: right knee x-ray: No acute fracture deformity or dislocation. Moderate suprapatellar joint effusion. Moderate medial, mild to  moderate patellofemoral compartment osteoarthrosis.   12-13-13: mri right knee: No acute finding.  Negative for meniscal or ligament tear. Mild to moderate degenerative change most notable in the patellofemoral compartment. Baker's cyst.   LABS REVIEWED:  12-10-13: wbc 10.8; hgb 14.0; hct 41.2; mcv 88.4. plt 254; glucose 111; bun 10; creat 0.98; k+3.8; na++139; liver normal albumin 4.8; hgb a1c 6.1; tsh 0.564 12-14-13: wbc 4.6; hgb 11.7; hct 35.2; mcv 88.4; plt 242; glucose 101; bun 8; creat 0.86; k+3.5; na++142      Review of Systems  Constitutional: Negative for malaise/fatigue.  Eyes: Negative for blurred vision.    Respiratory: Negative for cough and shortness of breath.   Cardiovascular: Negative for chest pain, palpitations and leg swelling.  Gastrointestinal: negative for heart burn and constipation  Negative for abdominal pain.  Genitourinary: Negative for dysuria.  Musculoskeletal: Negative for joint pain and myalgias.  Skin: Negative.   Neurological: Negative for dizziness, weakness and headaches.  Psychiatric/Behavioral: Negative for depression. The patient is not nervous/anxious.     Physical Exam  Constitutional: She is oriented to person, place, and time. She appears well-developed and well-nourished. No distress.  obese  Neck: Neck supple. No JVD present. No thyromegaly present.  Cardiovascular: Normal rate, regular rhythm and intact distal pulses.   Respiratory: Effort normal and breath sounds normal. No respiratory distress. She has no wheezes.  GI: Soft. Bowel sounds are normal. She exhibits no distension. There is no tenderness.  Musculoskeletal: Normal range of motion. She exhibits no edema.  Neurological: She is alert and oriented to person, place, and time. No cranial nerve deficit.  Skin: Skin is warm and dry. She is not diaphoretic.  Psychiatric: She has a normal mood and affect.         ASSESSMENT/ PLAN:  1. Vertigo: she was started on meclizine 25 mg twice daily on 12-21-13. Although more than likely her vertigo is orthostatic in nature; will setup an ent consult to further evaluate her vertigo for possible treatment options. And will continue to monitor her status.      Ok Edwards NP [facility]  Contact [phone] Monday through Friday 8am- 5pm  After hours call [phone]

## 2013-12-26 ENCOUNTER — Encounter: Payer: Self-pay | Admitting: Nurse Practitioner

## 2013-12-26 ENCOUNTER — Ambulatory Visit (INDEPENDENT_AMBULATORY_CARE_PROVIDER_SITE_OTHER): Payer: Medicare Other | Admitting: Nurse Practitioner

## 2013-12-26 VITALS — BP 110/62 | HR 64 | Ht 70.0 in | Wt 223.6 lb

## 2013-12-26 DIAGNOSIS — R1319 Other dysphagia: Secondary | ICD-10-CM

## 2013-12-26 DIAGNOSIS — K219 Gastro-esophageal reflux disease without esophagitis: Secondary | ICD-10-CM

## 2013-12-26 MED ORDER — NYSTATIN 100000 UNIT/ML MT SUSP: 500000.0000 [IU] | Freq: Four times a day (QID) | OROMUCOSAL | Status: DC

## 2013-12-26 NOTE — Patient Instructions (Addendum)
Continue Protonix and try taking thirty minutes before breakfast.  Prescription for Nystatin given today, please swish and swallow four times daily.  Continue to eat small bites of food.  Call in 2-3 weeks if swallowing is not better.

## 2013-12-26 NOTE — Progress Notes (Signed)
Reviewed and agree with management plan. Pt can follow up with [place] or [name] if her swallowing problems persist.  She sould choose 1 GI physician for optimal ongoing GI care.   Pricilla Riffle. Fuller Plan, MD [facility]

## 2013-12-26 NOTE — Progress Notes (Signed)
HPI :  Patient is a 68 year old female, new to this practice, here for evaluation of dysphasia and odynophagia. Patient was admitted for a few days earlier this month after a syncopal episode at home. Head CTscan / MRI negative for stroke. A 2D echocardiogram was normal except for grade 2 diastolic dysfunction. While hospitalized patient complained of dysphasia. A bedside swallowing evaluation by speech language pathologist raised question of possible LPR versus pharyngeal defects from tardive dyskinesia (? Med related) .Patient was able to compensate with certain maneuvers. A primary oropharyngeal issue was not suspected. Patient tells me she had no swallowing problems prior to falling. She denies any history of GERD.  Prior to recent hospitalization patient was living at home. She has had right knee pain since falling, x-rays unrevealing. She has been in a rehabilitation facility since discharge. Patient was accompanied by her cousin today  Past Medical History  Diagnosis Date  . Hypertension   . Vertigo   . Arthritis   . Hyperlipidemia   . Allergic rhinitis   . Hearing loss in left ear     since birth  . Depression   . Anxiety   . Bipolar 1 disorder   . Obesity   . Diverticulosis of colon (without mention of hemorrhage) 08/25/2011    [name]  . Anemia     Family History  Problem Relation Age of Onset  . Prostate cancer Brother   . Hypertension Brother   . Kidney disease Brother   . Hypertension Mother   . Kidney disease Mother   . Stomach cancer Father   . Hyperlipidemia Sister   . Hypothyroidism Sister   . Colon cancer Neg Hx    History  Substance Use Topics  . Smoking status: Never Smoker   . Smokeless tobacco: Never Used  . Alcohol Use: No   Current Outpatient Prescriptions  Medication Sig Dispense Refill  . FLUoxetine (PROZAC) 40 MG capsule Take 40 mg by mouth daily.      [name] losartan-hydrochlorothiazide (HYZAAR) 100-25 MG per tablet Take 1 tablet by mouth  daily.      . meclizine (ANTIVERT) 12.5 MG tablet Take 25 mg by mouth 2 (two) times daily.      . pantoprazole (PROTONIX) 40 MG tablet Take 1 tablet (40 mg total) by mouth daily.  30 tablet  3  . polyethylene glycol powder (GLYCOLAX/MIRALAX) powder Take 17 g by mouth daily.  3350 g  1  . risperiDONE (RISPERDAL) 0.5 MG tablet Take 0.5 mg by mouth at bedtime.      . traMADol (ULTRAM) 50 MG tablet Take 1 tablet (50 mg total) by mouth every 6 (six) hours as needed for moderate pain.  90 tablet  3  . nystatin (MYCOSTATIN) 100000 UNIT/ML suspension Take 5 mLs (500,000 Units total) by mouth 4 (four) times daily.  140 mL  0   No current facility-administered medications for this visit.   No Known Allergies  Review of Systems: All systems reviewed and negative except where noted in HPI.   Physical Exam: BP 110/62  Pulse 64  Ht 5\' 10"  (1.778 m)  Wt 223 lb 9.6 oz (101.424 kg)  BMI 32.08 kg/m2 Constitutional: Pleasant,obese black female in no acute distress. She is in a wheelchair HEENT: Normocephalic and atraumatic. Conjunctivae are normal. No scleral icterus. Neck supple.  Cardiovascular: Normal rate, regular rhythm.  Pulmonary/chest: Effort normal and breath sounds normal. No wheezing, rales or rhonchi. Abdominal: Limited exam, in wheelchair. Abdomen is soft, nondistended,  nontender. Bowel sounds active throughout. There are no masses palpable. No hepatomegaly. Extremities: no edema Lymphadenopathy: No cervical adenopathy noted. Neurological: Alert and oriented to person place and time. Skin: Skin is warm and dry. No rashes noted. Psychiatric: Normal mood and affect. Behavior is normal.   ASSESSMENT AND PLAN: 42. 68 year old female with development of dysphasia / odynophagia following syncopal episode earlier this month. Negative CT scan/MRI of the head. Speech language pathologist did bedside evaluation and doesn't suspect primary oropharyngeal issue. Patient also complains of recent  burning in her chest. After recently starting Protonix the dysphagia / odynophagia and chest burning have begun to improve. Symptoms could be related to esophagitis secondary to GERD. Candida esophagitis not excluded as patient did have Rocephin in the hospital.   Since patient is showing improvement on PPI will continue daily PPI for now.   Will treat empirically with nystatin swish and swallow for 7 days.   Patient does complain of sinus drainage which can exacerbate dysphasia. If appropriate, please prescribe an antihistamine.   If patient's symptoms do not continue to improve over the next couple weeks she will likely need upper endoscopy for further evaluation .  In the meantime continue small bites of food with frequent liquids.   I will send a copy of this note to [facility]    2. Family history of colon cancer in brother.  Patient had a screening colonoscopy by [name] November 2012 with findings of only an occasional diverticulum. [name]

## 2014-01-06 NOTE — Progress Notes (Signed)
Patient ID: Judy [name], female   DOB: 1946/05/03, 68 y.o.   MRN: [id number]     [facility]   PCP: Elyn Peers, MD  Code Status: full code  No Known Allergies  Chief Complaint  Patient presents with  . Hospitalization Follow-up    new admit    HPI:  68 y/o female patient is here for STR after hospital admission from 12/10/13- 12/14/13 with fall and a syncope. The syncope was thought to be from orthostatic hypotension. cardiac and neurological causes were ruled out. She was aslo treated for uti and has completed the antibiotics. She was also treated for right knee pain. With her weakness, therapy team recommended SNF and patient is here for rehabilitation. She is seen in her room today. Her pain is currently under control and she has been working with therapy team. occassional dizziness with change of position reported. No falls in the facility. No other concern from patient or staff today  Review of Systems:  Constitutional: Negative for fever, chills,malaise/fatigue  Eyes: Negative for eye pain, blurred vision, double vision and discharge.  Respiratory: Negative for cough, sputum production, shortness of breath and wheezing.   Cardiovascular: Negative for chest pain, palpitations, orthopnea and leg swelling.  Gastrointestinal: Negative for heartburn, nausea, vomiting, abdominal pain, diarrhea and constipation.  Genitourinary: Negative for dysuria, urgency, frequency, hematuria and flank pain.  Musculoskeletal: Negative for back pain, falls, joint pain and myalgias.  Skin: Negative for itching and rash.  Neurological: Negative for weakness, tingling, focal weakness and headaches.  Psychiatric/Behavioral: Negative for depression and memory loss. The patient is not nervous/anxious.     Past Medical History  Diagnosis Date  . Hypertension   . Vertigo   . Arthritis   . Hyperlipidemia   . Allergic rhinitis   . Hearing loss in left ear     since birth  . Depression    . Anxiety   . Bipolar 1 disorder   . Obesity   . Diverticulosis of colon (without mention of hemorrhage) 08/25/2011    [name]  . Anemia    Past Surgical History  Procedure Laterality Date  . Dilation and curettage of uterus  1960's  . Colonoscopy  08/25/2011    Procedure: COLONOSCOPY;  Surgeon: Landry Dyke, MD;  Location: [facility];  Service: Endoscopy;  Laterality: N/A;   Social History:   reports that she has never smoked. She has never used smokeless tobacco. She reports that she does not drink alcohol or use illicit drugs.  Family History  Problem Relation Age of Onset  . Prostate cancer Brother   . Hypertension Brother   . Kidney disease Brother   . Colon cancer Brother   . Hypertension Mother   . Kidney disease Mother   . Stomach cancer Father   . Hyperlipidemia Sister   . Hypothyroidism Sister     Medications: Patient's Medications  New Prescriptions   No medications on file  Previous Medications   FLUOXETINE (PROZAC) 40 MG CAPSULE    Take 40 mg by mouth daily.   LOSARTAN-HYDROCHLOROTHIAZIDE (HYZAAR) 100-25 MG PER TABLET    Take 1 tablet by mouth daily.   MECLIZINE (ANTIVERT) 12.5 MG TABLET    Take 25 mg by mouth 2 (two) times daily.   NYSTATIN (MYCOSTATIN) 100000 UNIT/ML SUSPENSION    Take 5 mLs (500,000 Units total) by mouth 4 (four) times daily.   PANTOPRAZOLE (PROTONIX) 40 MG TABLET    Take 1 tablet (40 mg  total) by mouth daily.   POLYETHYLENE GLYCOL POWDER (GLYCOLAX/MIRALAX) POWDER    Take 17 g by mouth daily.   RISPERIDONE (RISPERDAL) 0.5 MG TABLET    Take 0.5 mg by mouth at bedtime.   TRAMADOL (ULTRAM) 50 MG TABLET    Take 1 tablet (50 mg total) by mouth every 6 (six) hours as needed for moderate pain.  Modified Medications   No medications on file  Discontinued Medications   No medications on file     Physical Exam: Filed Vitals:   12/21/13 1734  BP: 136/80  Pulse: 90  Temp: 97.2 F (36.2 C)  Resp: 20  SpO2: 98%    General-  elderly female in no acute distress Head- atraumatic, normocephalic Eyes- PERRLA, EOMI, no pallor, no icterus, no discharge Neck- no lymphadenopathy Cardiovascular- normal s1,s2, no murmurs/ rubs/ gallops Respiratory- bilateral clear to auscultation, no wheeze, no rhonchi, no crackles Abdomen- bowel sounds present, soft, non tender Musculoskeletal- able to move all 4 extremities, no leg edema Neurological- no focal deficit Skin- warm and dry Psychiatry- alert and oriented to person, place and time, normal mood and affect  Labs reviewed: Basic Metabolic Panel:  Recent Labs  12/12/13 0448 12/13/13 0430 12/14/13 0400  NA 142 141 142  K 3.3* 3.4* 3.5*  CL 107 103 102  CO2 24 26 28   GLUCOSE 107* 96 101*  BUN 11 7 8   CREATININE 0.76 0.69 0.86  CALCIUM 8.7 9.5 9.6   Liver Function Tests:  Recent Labs  12/10/13 1230  AST 20  ALT 14  ALKPHOS 84  BILITOT 0.4  PROT 7.7  ALBUMIN 4.0   No results found for this basename: LIPASE, AMYLASE,  in the last 8760 hours No results found for this basename: AMMONIA,  in the last 8760 hours CBC:  Recent Labs  12/10/13 1230 12/11/13 0540 12/13/13 0430 12/14/13 0400  WBC 10.8* 7.5 6.3 4.6  NEUTROABS 9.3*  --   --   --   HGB 14.0 11.9* 12.6 11.7*  HCT 41.2 35.7* 38.2 35.2*  MCV 88.4 88.1 88.8 88.4  PLT 254 235 237 242   Cardiac Enzymes:  Recent Labs  12/10/13 1811 12/10/13 2350 12/11/13 0540  TROPONINI <0.30 <0.30 <0.30   BNP: No components found with this basename: POCBNP,  CBG:  Recent Labs  12/10/13 1235  GLUCAP 96    Radiological Exams: Dg Knee Right Port 12/11/2013 No acute fracture deformity. Moderate suprapatellar joint effusion. Moderate  medial, mild to mod patellofemoral compartment osteoarthrosis.    Assessment/Plan  Generalized weakness- will have her work with PT and OT team for gait training and strengthening exercises. Fall precautions.  Dizziness- monitor orthostatics for now. Will change her  antivert to 25 mg bid for now and monitor for signs of presyncope/ syncope  Dysphagia- continue mechanical soft diet for now. Continue follow up with SLP team, aspiration precautions  Constipation- continue miralax 17 g daly for now  GERD- stable. Continue protonix 40 mg daily  Hypertension- stable on hyzaar 100/25 mg dialy, monitor bp and bmp  Depression- stable at present. Will continue her prozac 40 mg daily and risperdal 0.5 mg daily. Monitor for behavior change  Family/ staff Communication: reviewed care plan with patient and nursing supervisor   Goals of care: STR   Labs/tests ordered- cbc, bmp    Blanchie Serve, MD  [facility] [phone] (Monday-Friday 8 am - 5 pm) [phone] (afterhours)

## 2014-01-18 ENCOUNTER — Non-Acute Institutional Stay (SKILLED_NURSING_FACILITY): Payer: Medicare Other | Admitting: Adult Health

## 2014-01-18 DIAGNOSIS — F319 Bipolar disorder, unspecified: Secondary | ICD-10-CM

## 2014-01-18 DIAGNOSIS — K219 Gastro-esophageal reflux disease without esophagitis: Secondary | ICD-10-CM

## 2014-01-18 DIAGNOSIS — I10 Essential (primary) hypertension: Secondary | ICD-10-CM

## 2014-01-18 DIAGNOSIS — K5900 Constipation, unspecified: Secondary | ICD-10-CM

## 2014-01-18 DIAGNOSIS — R55 Syncope and collapse: Secondary | ICD-10-CM

## 2014-01-18 DIAGNOSIS — R42 Dizziness and giddiness: Secondary | ICD-10-CM

## 2014-01-24 ENCOUNTER — Non-Acute Institutional Stay (SKILLED_NURSING_FACILITY): Payer: Medicare Other | Admitting: Adult Health

## 2014-01-24 DIAGNOSIS — I10 Essential (primary) hypertension: Secondary | ICD-10-CM

## 2014-01-24 DIAGNOSIS — R42 Dizziness and giddiness: Secondary | ICD-10-CM

## 2014-01-24 DIAGNOSIS — F319 Bipolar disorder, unspecified: Secondary | ICD-10-CM

## 2014-01-24 DIAGNOSIS — R55 Syncope and collapse: Secondary | ICD-10-CM

## 2014-01-27 NOTE — Progress Notes (Signed)
Patient ID: Judy [name], female   DOB: 02/03/1946, 68 y.o.   MRN: [id number]     [facility]   No Known Allergies   Chief Complaint  Patient presents with  . Medical Managment of Chronic Issues    HPI:  She is being seen for the management of her chronic illnesses. She is feeling better; her vertigo is improving her antivert has been stopped. She is ambulating with a walker. There are no concerns being voiced by the nursing staff at this time.   Past Medical History  Diagnosis Date  . Hypertension   . Vertigo   . Arthritis   . Hyperlipidemia   . Allergic rhinitis   . Hearing loss in left ear     since birth  . Depression   . Anxiety   . Bipolar 1 disorder   . Obesity   . Diverticulosis of colon (without mention of hemorrhage) 08/25/2011    [name]  . Anemia     Past Surgical History  Procedure Laterality Date  . Dilation and curettage of uterus  1960's  . Colonoscopy  08/25/2011    Procedure: COLONOSCOPY;  Surgeon: Landry Dyke, MD;  Location: [facility];  Service: Endoscopy;  Laterality: N/A;    VITAL SIGNS BP 128/74  Pulse 62  Ht 5\' 10"  (1.778 m)  Wt 229 lb 12.8 oz (104.237 kg)  BMI 32.97 kg/m2   Patient's Medications  New Prescriptions   No medications on file  Previous Medications   FLUOXETINE (PROZAC) 40 MG CAPSULE    Take 40 mg by mouth daily.   LOSARTAN-HYDROCHLOROTHIAZIDE (HYZAAR) 100-25 MG PER TABLET    Take 1 tablet by mouth daily.   PANTOPRAZOLE (PROTONIX) 40 MG TABLET    Take 1 tablet (40 mg total) by mouth daily.   POLYETHYLENE GLYCOL POWDER (GLYCOLAX/MIRALAX) POWDER    Take 17 g by mouth daily.   RISPERIDONE (RISPERDAL) 0.5 MG TABLET    Take 0.5 mg by mouth at bedtime.   TRAMADOL (ULTRAM) 50 MG TABLET    Take 1 tablet (50 mg total) by mouth every 6 (six) hours as needed for moderate pain.  Modified Medications   No medications on file  Discontinued Medications   MECLIZINE (ANTIVERT) 12.5 MG TABLET    Take 25 mg by mouth 2  (two) times daily.   NYSTATIN (MYCOSTATIN) 100000 UNIT/ML SUSPENSION    Take 5 mLs (500,000 Units total) by mouth 4 (four) times daily.    SIGNIFICANT DIAGNOSTIC EXAMS  12-10-13: ct of head: No acute intracranial injury. Mild age-related involutional change stable from prior study.   12-10-13: chest x-ray: 1. No radiographic evidence of acute cardiopulmonary disease.  12-10-13: mri of head: 1. No acute intracranial abnormality. Stable small right scalp hematoma. 2. Moderate for age signal changes in the brain, nonspecific but favor chronic small vessel disease related.   12-11-13: 2-d echo: Left ventricle: The cavity size was normal. Systolic function was normal. The estimated ejection fraction was in the range of 55% to 60%. Wall motion was normal; there were no regional wall motion abnormalities. There was an increased relative contribution of atrial contraction to ventricular filling. Features are consistent with a pseudonormal left ventricular filling pattern, with concomitant abnormal relaxation and increased filling pressure (grade 2 diastolic dysfunction). - Left atrium: The atrium was mildly dilated.  12-11-13: right knee x-ray: No acute fracture deformity or dislocation. Moderate suprapatellar joint effusion. Moderate medial, mild to moderate patellofemoral compartment osteoarthrosis.   12-13-13: mri  right knee: No acute finding.  Negative for meniscal or ligament tear. Mild to moderate degenerative change most notable in the patellofemoral compartment. Baker's cyst.   LABS REVIEWED:  12-10-13: wbc 10.8; hgb 14.0; hct 41.2; mcv 88.4. plt 254; glucose 111; bun 10; creat 0.98; k+3.8; na++139; liver normal albumin 4.8; hgb a1c 6.1; tsh 0.564 12-14-13: wbc 4.6; hgb 11.7; hct 35.2; mcv 88.4; plt 242; glucose 101; bun 8; creat 0.86; k+3.5; na++142 12-27-13: wbc 6.4; hgb 11.4; hct 35.8; mcv 89.5; plt 223; glucose 102; bun 19; creat 1.0; k+3.1; na++140 3-26-5: glucose 98; bun 13; creat 0.9; k+3.2;  na++140 01-07-14: glucose 99; bun 19; creat 1.0; k+3.5; na++140       Review of Systems  Constitutional: Negative for malaise/fatigue.  Eyes: Negative for blurred vision.  Respiratory: Negative for cough and shortness of breath.   Cardiovascular: Negative for chest pain, palpitations and leg swelling.  Gastrointestinal: negative for heart burn and constipation  Negative for abdominal pain.  Genitourinary: Negative for dysuria.  Musculoskeletal: Negative for joint pain and myalgias.  Skin: Negative.   Neurological: Negative for dizziness, weakness and headaches.  Psychiatric/Behavioral: Negative for depression. The patient is not nervous/anxious.     Physical Exam  Constitutional: She is oriented to person, place, and time. She appears well-developed and well-nourished. No distress.  obese  Neck: Neck supple. No JVD present. No thyromegaly present.  Cardiovascular: Normal rate, regular rhythm and intact distal pulses.   Respiratory: Effort normal and breath sounds normal. No respiratory distress. She has no wheezes.  GI: Soft. Bowel sounds are normal. She exhibits no distension. There is no tenderness.  Musculoskeletal: Normal range of motion. She exhibits no edema.  Neurological: She is alert and oriented to person, place, and time. No cranial nerve deficit.  Skin: Skin is warm and dry. She is not diaphoretic.  Psychiatric: She has a normal mood and affect.         ASSESSMENT/ PLAN:   1. Hypertension: she is stable will continue her hyzaar 100/25 mg dialy and will continue to monitor her status   2. Vertigo: no complaints at the present time; will continue to monitor   3. Bipolar disease: she is emotionally stable will continue prozac 40 mg daily and risperdal 0.5 mg nightly and iwll continue to monitor her status.   4. Syncope: no further episodes present;  5. Gerd: will continue  protonix 40 mg daily and will monitor   6. Constipation: will continue  miralax daily          Ok Edwards NP [facility]  Contact [phone] Monday through Friday 8am- 5pm  After hours call [phone]

## 2014-01-28 DIAGNOSIS — R42 Dizziness and giddiness: Secondary | ICD-10-CM

## 2014-01-28 DIAGNOSIS — I69998 Other sequelae following unspecified cerebrovascular disease: Secondary | ICD-10-CM

## 2014-01-28 DIAGNOSIS — I951 Orthostatic hypotension: Secondary | ICD-10-CM

## 2014-01-28 DIAGNOSIS — F3130 Bipolar disorder, current episode depressed, mild or moderate severity, unspecified: Secondary | ICD-10-CM

## 2014-02-03 ENCOUNTER — Encounter: Payer: Self-pay | Admitting: Adult Health

## 2014-02-03 NOTE — Progress Notes (Signed)
Patient ID: Judy [name], female   DOB: 07-18-1946, 68 y.o.   MRN: [id number]    [facility]  No Known Allergies   Chief Complaint  Patient presents with  . Discharge Note    HPI:  She is being discharged to home with home health for pt/ot/nursing. She will need a rollater and tub bench. She will need prescriptions to be written.  She had been hospitalized for a fall. She was admitted to this facility for short term rehab. Her vertigo has nearly resolved    Past Medical History  Diagnosis Date  . Hypertension   . Vertigo   . Arthritis   . Hyperlipidemia   . Allergic rhinitis   . Hearing loss in left ear     since birth  . Depression   . Anxiety   . Bipolar 1 disorder   . Obesity   . Diverticulosis of colon (without mention of hemorrhage) 08/25/2011    [name]  . Anemia     Past Surgical History  Procedure Laterality Date  . Dilation and curettage of uterus  1960's  . Colonoscopy  08/25/2011    Procedure: COLONOSCOPY;  Surgeon: Landry Dyke, MD;  Location: [facility];  Service: Endoscopy;  Laterality: N/A;    VITAL SIGNS BP 127/86  Pulse 72  Ht 5\' 10"  (1.778 m)  Wt 226 lb 12.8 oz (102.876 kg)  BMI 32.54 kg/m2   Patient's Medications  New Prescriptions   No medications on file  Previous Medications   FLUOXETINE (PROZAC) 40 MG CAPSULE    Take 40 mg by mouth daily.   LOSARTAN-HYDROCHLOROTHIAZIDE (HYZAAR) 100-25 MG PER TABLET    Take 1 tablet by mouth daily.   PANTOPRAZOLE (PROTONIX) 40 MG TABLET    Take 1 tablet (40 mg total) by mouth daily.   POLYETHYLENE GLYCOL POWDER (GLYCOLAX/MIRALAX) POWDER    Take 17 g by mouth daily.   RISPERIDONE (RISPERDAL) 0.5 MG TABLET    Take 0.5 mg by mouth at bedtime.   TRAMADOL (ULTRAM) 50 MG TABLET    Take 1 tablet (50 mg total) by mouth every 6 (six) hours as needed for moderate pain.  Modified Medications   No medications on file  Discontinued Medications   No medications on file    SIGNIFICANT DIAGNOSTIC  EXAMS  12-10-13: ct of head: No acute intracranial injury. Mild age-related involutional change stable from prior study.   12-10-13: chest x-ray: 1. No radiographic evidence of acute cardiopulmonary disease.  12-10-13: mri of head: 1. No acute intracranial abnormality. Stable small right scalp hematoma. 2. Moderate for age signal changes in the brain, nonspecific but favor chronic small vessel disease related.   12-11-13: 2-d echo: Left ventricle: The cavity size was normal. Systolic function was normal. The estimated ejection fraction was in the range of 55% to 60%. Wall motion was normal; there were no regional wall motion abnormalities. There was an increased relative contribution of atrial contraction to ventricular filling. Features are consistent with a pseudonormal left ventricular filling pattern, with concomitant abnormal relaxation and increased filling pressure (grade 2 diastolic dysfunction). - Left atrium: The atrium was mildly dilated.  12-11-13: right knee x-ray: No acute fracture deformity or dislocation. Moderate suprapatellar joint effusion. Moderate medial, mild to moderate patellofemoral compartment osteoarthrosis.   12-13-13: mri right knee: No acute finding.  Negative for meniscal or ligament tear. Mild to moderate degenerative change most notable in the patellofemoral compartment. Baker's cyst.   LABS REVIEWED:  12-10-13: wbc 10.8;  hgb 14.0; hct 41.2; mcv 88.4. plt 254; glucose 111; bun 10; creat 0.98; k+3.8; na++139; liver normal albumin 4.8; hgb a1c 6.1; tsh 0.564 12-14-13: wbc 4.6; hgb 11.7; hct 35.2; mcv 88.4; plt 242; glucose 101; bun 8; creat 0.86; k+3.5; na++142 12-27-13: wbc 6.4; hgb 11.4; hct 35.8; mcv 89.5; plt 223; glucose 102; bun 19; creat 1.0; k+3.1; na++140 3-26-5: glucose 98; bun 13; creat 0.9; k+3.2; na++140 01-07-14: glucose 99; bun 19; creat 1.0; k+3.5; na++140       Review of Systems  Constitutional: Negative for malaise/fatigue.  Eyes: Negative for blurred  vision.  Respiratory: Negative for cough and shortness of breath.   Cardiovascular: Negative for chest pain, palpitations and leg swelling.  Gastrointestinal: negative for heart burn and constipation  Negative for abdominal pain.  Genitourinary: Negative for dysuria.  Musculoskeletal: Negative for joint pain and myalgias.  Skin: Negative.   Neurological: Negative for dizziness, weakness and headaches.  Psychiatric/Behavioral: Negative for depression. The patient is not nervous/anxious.     Physical Exam  Constitutional: She is oriented to person, place, and time. She appears well-developed and well-nourished. No distress.  obese  Neck: Neck supple. No JVD present. No thyromegaly present.  Cardiovascular: Normal rate, regular rhythm and intact distal pulses.   Respiratory: Effort normal and breath sounds normal. No respiratory distress. She has no wheezes.  GI: Soft. Bowel sounds are normal. She exhibits no distension. There is no tenderness.  Musculoskeletal: Normal range of motion. She exhibits no edema.  Neurological: She is alert and oriented to person, place, and time. No cranial nerve deficit.  Skin: Skin is warm and dry. She is not diaphoretic.  Psychiatric: She has a normal mood and affect.        ASSESSMENT/ PLAN:  Will discharge her to home with home health for pt/ot/nursing. She will need a rollater and a tub bench. Her prescriptions have been written.    Time spent with patient 45 minutes.     Ok Edwards NP [facility]  Contact [phone] Monday through Friday 8am- 5pm  After hours call [phone]

## 2014-03-06 ENCOUNTER — Ambulatory Visit (INDEPENDENT_AMBULATORY_CARE_PROVIDER_SITE_OTHER): Payer: Medicare Other | Admitting: Nurse Practitioner

## 2014-03-06 ENCOUNTER — Emergency Department (HOSPITAL_COMMUNITY): Payer: Medicare Other

## 2014-03-06 ENCOUNTER — Encounter: Payer: Self-pay | Admitting: Nurse Practitioner

## 2014-03-06 ENCOUNTER — Telehealth: Payer: Self-pay | Admitting: *Deleted

## 2014-03-06 ENCOUNTER — Encounter (HOSPITAL_COMMUNITY): Payer: Self-pay | Admitting: Emergency Medicine

## 2014-03-06 ENCOUNTER — Emergency Department (HOSPITAL_COMMUNITY)
Admission: EM | Admit: 2014-03-06 | Discharge: 2014-03-06 | Disposition: A | Discharge status: Home / Self Care | Payer: Medicare Other | Attending: Emergency Medicine | Admitting: Emergency Medicine

## 2014-03-06 VITALS — BP 110/70 | HR 60 | Temp 98.2°F | Resp 18 | Ht 66.93 in | Wt 216.8 lb

## 2014-03-06 DIAGNOSIS — Y9289 Other specified places as the place of occurrence of the external cause: Secondary | ICD-10-CM | POA: Insufficient documentation

## 2014-03-06 DIAGNOSIS — N179 Acute kidney failure, unspecified: Secondary | ICD-10-CM

## 2014-03-06 DIAGNOSIS — R5381 Other malaise: Secondary | ICD-10-CM | POA: Insufficient documentation

## 2014-03-06 DIAGNOSIS — H9190 Unspecified hearing loss, unspecified ear: Secondary | ICD-10-CM | POA: Insufficient documentation

## 2014-03-06 DIAGNOSIS — I10 Essential (primary) hypertension: Secondary | ICD-10-CM

## 2014-03-06 DIAGNOSIS — R29810 Facial weakness: Secondary | ICD-10-CM | POA: Insufficient documentation

## 2014-03-06 DIAGNOSIS — R42 Dizziness and giddiness: Secondary | ICD-10-CM

## 2014-03-06 DIAGNOSIS — R5383 Other fatigue: Secondary | ICD-10-CM

## 2014-03-06 DIAGNOSIS — K219 Gastro-esophageal reflux disease without esophagitis: Secondary | ICD-10-CM

## 2014-03-06 DIAGNOSIS — S99929A Unspecified injury of unspecified foot, initial encounter: Secondary | ICD-10-CM

## 2014-03-06 DIAGNOSIS — E860 Dehydration: Secondary | ICD-10-CM | POA: Insufficient documentation

## 2014-03-06 DIAGNOSIS — F319 Bipolar disorder, unspecified: Secondary | ICD-10-CM

## 2014-03-06 DIAGNOSIS — Z862 Personal history of diseases of the blood and blood-forming organs and certain disorders involving the immune mechanism: Secondary | ICD-10-CM | POA: Insufficient documentation

## 2014-03-06 DIAGNOSIS — F329 Major depressive disorder, single episode, unspecified: Secondary | ICD-10-CM | POA: Insufficient documentation

## 2014-03-06 DIAGNOSIS — E669 Obesity, unspecified: Secondary | ICD-10-CM | POA: Insufficient documentation

## 2014-03-06 DIAGNOSIS — Y9389 Activity, other specified: Secondary | ICD-10-CM | POA: Insufficient documentation

## 2014-03-06 DIAGNOSIS — E7800 Pure hypercholesterolemia, unspecified: Secondary | ICD-10-CM

## 2014-03-06 DIAGNOSIS — D649 Anemia, unspecified: Secondary | ICD-10-CM

## 2014-03-06 DIAGNOSIS — S99919A Unspecified injury of unspecified ankle, initial encounter: Secondary | ICD-10-CM | POA: Insufficient documentation

## 2014-03-06 DIAGNOSIS — F411 Generalized anxiety disorder: Secondary | ICD-10-CM | POA: Insufficient documentation

## 2014-03-06 DIAGNOSIS — Z8719 Personal history of other diseases of the digestive system: Secondary | ICD-10-CM | POA: Insufficient documentation

## 2014-03-06 DIAGNOSIS — F3289 Other specified depressive episodes: Secondary | ICD-10-CM | POA: Insufficient documentation

## 2014-03-06 DIAGNOSIS — Z79899 Other long term (current) drug therapy: Secondary | ICD-10-CM | POA: Insufficient documentation

## 2014-03-06 DIAGNOSIS — S8990XA Unspecified injury of unspecified lower leg, initial encounter: Secondary | ICD-10-CM | POA: Insufficient documentation

## 2014-03-06 DIAGNOSIS — M129 Arthropathy, unspecified: Secondary | ICD-10-CM | POA: Insufficient documentation

## 2014-03-06 DIAGNOSIS — K5900 Constipation, unspecified: Secondary | ICD-10-CM

## 2014-03-06 DIAGNOSIS — R296 Repeated falls: Secondary | ICD-10-CM | POA: Insufficient documentation

## 2014-03-06 LAB — BASIC METABOLIC PANEL
BUN: 20 mg/dL (ref 6–23)
CO2: 27 mEq/L (ref 19–32)
Calcium: 9.8 mg/dL (ref 8.4–10.5)
Chloride: 98 mEq/L (ref 96–112)
Creatinine, Ser: 1.56 mg/dL — ABNORMAL HIGH (ref 0.50–1.10)
GFR, EST AFRICAN AMERICAN: 39 mL/min — AB (ref >90)
GFR, EST NON AFRICAN AMERICAN: 34 mL/min — AB (ref >90)
GLUCOSE: 107 mg/dL — AB (ref 70–99)
Potassium: 3.4 mEq/L — ABNORMAL LOW (ref 3.7–5.3)
SODIUM: 140 meq/L (ref 137–147)

## 2014-03-06 LAB — CBC WITH DIFFERENTIAL/PLATELET
BASOS ABS: 0 10*3/uL (ref 0.0–0.1)
Basophils Relative: 0 % (ref 0–1)
EOS ABS: 0.1 10*3/uL (ref 0.0–0.7)
Eosinophils Relative: 1 % (ref 0–5)
HCT: 36.4 % (ref 36.0–46.0)
Hemoglobin: 12.2 g/dL (ref 12.0–15.0)
Lymphocytes Relative: 26 % (ref 12–46)
Lymphs Abs: 2.3 10*3/uL (ref 0.7–4.0)
MCH: 29.7 pg (ref 26.0–34.0)
MCHC: 33.5 g/dL (ref 30.0–36.0)
MCV: 88.6 fL (ref 78.0–100.0)
Monocytes Absolute: 0.9 10*3/uL (ref 0.1–1.0)
Monocytes Relative: 10 % (ref 3–12)
NEUTROS ABS: 5.7 10*3/uL (ref 1.7–7.7)
NEUTROS PCT: 63 % (ref 43–77)
PLATELETS: 262 10*3/uL (ref 150–400)
RBC: 4.11 MIL/uL (ref 3.87–5.11)
RDW: 14.7 % (ref 11.5–15.5)
WBC: 9 10*3/uL (ref 4.0–10.5)

## 2014-03-06 MED ORDER — SODIUM CHLORIDE 0.9 % IV BOLUS (SEPSIS)
1000.0000 mL | Freq: Once | INTRAVENOUS | Status: AC
  Administered 2014-03-06: 1000 mL via INTRAVENOUS

## 2014-03-06 NOTE — Telephone Encounter (Signed)
ok 

## 2014-03-06 NOTE — Patient Instructions (Signed)
Will get scan of head today or tomorrow and blood work today Increase water intake  SEEK IMMEDIATE MEDICAL CARE IF:   You have sudden weakness or numbness of the face, arm, or leg, especially on one side of the body.  Your face or eyelid droops to one side.  You have sudden confusion.  You have trouble speaking (aphasia) or understanding.  You have sudden trouble seeing in one or both eyes.  You have sudden trouble walking.  You have dizziness.  You have a loss of balance or coordination.  You have a sudden, severe headache with no known cause.  You have new chest pain or an irregular heartbeat. Any of these symptoms may represent a serious problem that is an emergency. Do not wait to see if the symptoms will go away. Get medical help at once. Call your local emergency services  (911 in [place] Do not drive yourself to the hospital. Document Released: 11/04/2004 Document Revised: 07/18/2013 Document Reviewed: 03/30/2013 [facility] Patient Information 2014 [place].

## 2014-03-06 NOTE — Telephone Encounter (Signed)
Patient sister called and stated that patient is worse. Informed her to go ahead and take her to the ER to be evaluated. Sister thinks it is possibly a stroke. Told her  Not to wait to take her on to the ER and she agreed.

## 2014-03-06 NOTE — ED Notes (Signed)
Pt out of room for imaging at this time

## 2014-03-06 NOTE — ED Notes (Signed)
IVF completed, see MAR Patient ambulated in hallway without difficulty or assistance from staff Patient in NAD

## 2014-03-06 NOTE — Progress Notes (Signed)
Patient ID: Judy [name], female   DOB: 31-Mar-1946, 68 y.o.   MRN: [id number]    No Known Allergies  Chief Complaint  Patient presents with  . to establish care    HPI: Patient is a 68 y.o. female seen in the office today to establish care. Pt was previously seen at [facility] by Dr Bubba Camp and they would like to establish care with her. Was discharged from [facility] last month and had home health which had been going well. Completed treatment last week. Lives alone. Here with her sister Would like to have forms filled out for scat bus Knees still sore from fall Dizziness persist at times but is better.  Sister and pt notes that she has had a decline in the last 2 weeks. Noted eye twitching and weakness that has persisted for 1 week and was worse 3 days ago and has remained unchanged since. Reports she is just not herself.  Sister reports she can not just stay with her. Pt is HOH and history has been fairly inconsistent but sister and pt both report a slight facial droop is NOT new and has been going on for several days Review of Systems:  Review of Systems  Constitutional: Positive for weight loss (a little) and malaise/fatigue. Negative for fever and chills.  HENT: Positive for hearing loss (bilateral hearing loss).   Eyes: Positive for blurred vision (since her fall).  Respiratory: Negative for shortness of breath.   Cardiovascular: Negative for chest pain and leg swelling.  Gastrointestinal: Positive for heartburn and constipation. Negative for abdominal pain and diarrhea.  Genitourinary: Negative for dysuria, urgency and frequency.  Musculoskeletal: Positive for falls (none since she was discharged from [facility] ), joint pain and myalgias.       Pain in muscles and joints in her legs.  Skin:       Dry skin  Neurological: Positive for dizziness and weakness.       Weak on left side since fall   Psychiatric/Behavioral: Positive for depression and memory loss. Negative for suicidal  ideas and hallucinations. The patient is nervous/anxious. The patient does not have insomnia.      Past Medical History  Diagnosis Date  . Hypertension   . Vertigo   . Arthritis   . Hyperlipidemia   . Allergic rhinitis   . Hearing loss in left ear     since birth  . Depression   . Anxiety   . Bipolar 1 disorder   . Obesity   . Diverticulosis of colon (without mention of hemorrhage) 08/25/2011    [name]  . Anemia    Past Surgical History  Procedure Laterality Date  . Dilation and curettage of uterus  1960's  . Colonoscopy  08/25/2011    Procedure: COLONOSCOPY;  Surgeon: Landry Dyke, MD;  Location: [facility];  Service: Endoscopy;  Laterality: N/A;   Social History:   reports that she has never smoked. She has never used smokeless tobacco. She reports that she does not drink alcohol or use illicit drugs.  Family History  Problem Relation Age of Onset  . Prostate cancer Brother   . Hypertension Brother   . Kidney disease Brother   . Colon cancer Brother   . Hypertension Mother   . Kidney disease Mother   . Stomach cancer Father   . Hyperlipidemia Sister   . Hypothyroidism Sister   . Stroke Brother   . Prostate cancer Brother     Medications: Patient's Medications  New Prescriptions   No medications on file  Previous Medications   FLUOXETINE (PROZAC) 40 MG CAPSULE    Take 40 mg by mouth daily.   LOSARTAN-HYDROCHLOROTHIAZIDE (HYZAAR) 100-25 MG PER TABLET    Take 1 tablet by mouth daily.   LURASIDONE (LATUDA) 80 MG TABS TABLET    Take 80 mg by mouth daily with breakfast.   PANTOPRAZOLE (PROTONIX) 40 MG TABLET    Take 1 tablet (40 mg total) by mouth daily.   POLYETHYLENE GLYCOL POWDER (GLYCOLAX/MIRALAX) POWDER    Take 17 g by mouth daily.   RISPERIDONE (RISPERDAL) 0.5 MG TABLET    Take 0.5 mg by mouth at bedtime.  Modified Medications   No medications on file  Discontinued Medications   TRAMADOL (ULTRAM) 50 MG TABLET    Take 1 tablet (50 mg total) by  mouth every 6 (six) hours as needed for moderate pain.     Physical Exam:  Filed Vitals:   03/06/14 0931  BP: 110/70  Pulse: 60  Temp: 98.2 F (36.8 C)  TempSrc: Oral  Resp: 18  Height: 5' 6.93" (1.7 m)  Weight: 216 lb 12.8 oz (98.34 kg)  SpO2: 99%    Physical Exam  Constitutional: She is oriented to person, place, and time. She appears well-developed and well-nourished. No distress.  HENT:  Mouth/Throat: Oropharynx is clear and moist. No oropharyngeal exudate.  Eyes: Conjunctivae and EOM are normal. Pupils are equal, round, and reactive to light.  Neck: Normal range of motion. Neck supple. No thyromegaly present.  Cardiovascular: Normal rate, regular rhythm and normal heart sounds.   Pulmonary/Chest: Effort normal and breath sounds normal. No respiratory distress.  Abdominal: Soft. Bowel sounds are normal.  Musculoskeletal: Normal range of motion. She exhibits no edema and no tenderness.  Neurological: She is alert and oriented to person, place, and time. She displays normal reflexes.  Generalized weakness bilaterally Mild left droop, noted when pt smiles and raises eye brows   Skin: She is not diaphoretic.  Psychiatric: Her speech is not slurred.  Flat affect     Labs reviewed: Basic Metabolic Panel:  Recent Labs  12/10/13 1230 12/10/13 1811  12/12/13 0448 12/13/13 0430 12/14/13 0400  NA 139  --   < > 142 141 142  K 3.8  --   < > 3.3* 3.4* 3.5*  CL 98  --   < > 107 103 102  CO2 28  --   < > 24 26 28   GLUCOSE 111*  --   < > 107* 96 101*  BUN 10  --   < > 11 7 8   CREATININE 0.98  --   < > 0.76 0.69 0.86  CALCIUM 10.1  --   < > 8.7 9.5 9.6  TSH  --  0.564  --   --   --   --   < > = values in this interval not displayed. Liver Function Tests:  Recent Labs  12/10/13 1230  AST 20  ALT 14  ALKPHOS 84  BILITOT 0.4  PROT 7.7  ALBUMIN 4.0   No results found for this basename: LIPASE, AMYLASE,  in the last 8760 hours No results found for this basename:  AMMONIA,  in the last 8760 hours CBC:  Recent Labs  12/10/13 1230 12/11/13 0540 12/13/13 0430 12/14/13 0400  WBC 10.8* 7.5 6.3 4.6  NEUTROABS 9.3*  --   --   --   HGB 14.0 11.9* 12.6 11.7*  HCT 41.2 35.7* 38.2  35.2*  MCV 88.4 88.1 88.8 88.4  PLT 254 235 237 242   Lipid Panel: No results found for this basename: CHOL, HDL, LDLCALC, TRIG, CHOLHDL, LDLDIRECT,  in the last 8760 hours TSH:  Recent Labs  12/10/13 1811  TSH 0.564     Assessment/Plan 1. Vertigo -has improved, does have occasional episodes   2. Bipolar 1 disorder -mood stable   3. GERD (gastroesophageal reflux disease) -was previously on protonix, needs refill on protoix   4. Constipation -increase water intake, may use colace OTC -to cont miralax  5. HYPERTENSION Controlled on current medications  6. HYPERCHOLESTEROLEMIA -fasting today will get Lipid panel  7. Anemia - CBC With differential/Platelet  8. Other malaise and fatigue -with noted left sided facial droop and generalized weakness Pt is HOH and history has been fairly inconsistent but sister and pt both report a slight facial droop is NOT new and has been going on for several days.  - CT Head Wo Contrast; Future- will get today due to facial droop and weakness  - CBC With differential/Platelet - Comprehensive metabolic panel - TSH -will treat as outpt since symptoms have been unchanged over several days Pt and sister given precautions that if symptoms change or worsen to seek medical attention, stoke signs and symptoms reviewed, both pt and sister understands  Follow up in 2 weeks with Bubba Camp

## 2014-03-06 NOTE — ED Notes (Signed)
Pt states she was here March 7 after she blacked out and fell.  Went from here to [facility].  Back at home now.  Had occupational therapy and home health that were coming.  They stopped coming last week.  Went to see her new doctor this morning.  States that she has been having dizziness off and on since she fell, rt foot pain and swelling x 1 day and lt sided facial droop x 2 days.  No slurred speech, equal grip strength, no arm drift.

## 2014-03-06 NOTE — Discharge Instructions (Signed)
Dehydration, Adult Dehydration is when you lose more fluids from the body than you take in. Vital organs like the kidneys, brain, and heart cannot function without a proper amount of fluids and salt. Any loss of fluids from the body can cause dehydration.  CAUSES   Vomiting.  Diarrhea.  Excessive sweating.  Excessive urine output.  Fever. SYMPTOMS  Mild dehydration  Thirst.  Dry lips.  Slightly dry mouth. Moderate dehydration  Very dry mouth.  Sunken eyes.  Skin does not bounce back quickly when lightly pinched and released.  Dark urine and decreased urine production.  Decreased tear production.  Headache. Severe dehydration  Very dry mouth.  Extreme thirst.  Rapid, weak pulse (more than 100 beats per minute at rest).  Cold hands and feet.  Not able to sweat in spite of heat and temperature.  Rapid breathing.  Blue lips.  Confusion and lethargy.  Difficulty being awakened.  Minimal urine production.  No tears. DIAGNOSIS  Your caregiver will diagnose dehydration based on your symptoms and your exam. Blood and urine tests will help confirm the diagnosis. The diagnostic evaluation should also identify the cause of dehydration. TREATMENT  Treatment of mild or moderate dehydration can often be done at home by increasing the amount of fluids that you drink. It is best to drink small amounts of fluid more often. Drinking too much at one time can make vomiting worse. Refer to the home care instructions below. Severe dehydration needs to be treated at the hospital where you will probably be given intravenous (IV) fluids that contain water and electrolytes. HOME CARE INSTRUCTIONS   Ask your caregiver about specific rehydration instructions.  Drink enough fluids to keep your urine clear or pale yellow.  Drink small amounts frequently if you have nausea and vomiting.  Eat as you normally do.  Avoid:  Foods or drinks high in sugar.  Carbonated  drinks.  Juice.  Extremely hot or cold fluids.  Drinks with caffeine.  Fatty, greasy foods.  Alcohol.  Tobacco.  Overeating.  Gelatin desserts.  Wash your hands well to avoid spreading bacteria and viruses.  Only take over-the-counter or prescription medicines for pain, discomfort, or fever as directed by your caregiver.  Ask your caregiver if you should continue all prescribed and over-the-counter medicines.  Keep all follow-up appointments with your caregiver. SEEK MEDICAL CARE IF:  You have abdominal pain and it increases or stays in one area (localizes).  You have a rash, stiff neck, or severe headache.  You are irritable, sleepy, or difficult to awaken.  You are weak, dizzy, or extremely thirsty. SEEK IMMEDIATE MEDICAL CARE IF:   You are unable to keep fluids down or you get worse despite treatment.  You have frequent episodes of vomiting or diarrhea.  You have blood or green matter (bile) in your vomit.  You have blood in your stool or your stool looks black and tarry.  You have not urinated in 6 to 8 hours, or you have only urinated a small amount of very dark urine.  You have a fever.  You faint. MAKE SURE YOU:   Understand these instructions.  Will watch your condition.  Will get help right away if you are not doing well or get worse. Document Released: 09/27/2005 Document Revised: 12/20/2011 Document Reviewed: 05/17/2011 [facility] Patient Information 2014 [place], [place]. Dizziness Dizziness is a common problem. It is a feeling of unsteadiness or lightheadedness. You may feel like you are about to faint. Dizziness can lead to injury  if you stumble or fall. A person of any age group can suffer from dizziness, but dizziness is more common in older adults. CAUSES  Dizziness can be caused by many different things, including:  Middle ear problems.  Standing for too long.  Infections.  An allergic reaction.  Aging.  An emotional response to  something, such as the sight of blood.  Side effects of medicines.  Fatigue.  Problems with circulation or blood pressure.  Excess use of alcohol, medicines, or illegal drug use.  Breathing too fast (hyperventilation).  An arrhythmia or problems with your heart rhythm.  Low red blood cell count (anemia).  Pregnancy.  Vomiting, diarrhea, fever, or other illnesses that cause dehydration.  Diseases or conditions such as Parkinson's disease, high blood pressure (hypertension), diabetes, and thyroid problems.  Exposure to extreme heat. DIAGNOSIS  To find the cause of your dizziness, your caregiver may do a physical exam, lab tests, radiologic imaging scans, or an electrocardiography test (ECG).  TREATMENT  Treatment of dizziness depends on the cause of your symptoms and can vary greatly. HOME CARE INSTRUCTIONS   Drink enough fluids to keep your urine clear or pale yellow. This is especially important in very hot weather. In the elderly, it is also important in cold weather.  If your dizziness is caused by medicines, take them exactly as directed. When taking blood pressure medicines, it is especially important to get up slowly.  Rise slowly from chairs and steady yourself until you feel okay.  In the morning, first sit up on the side of the bed. When this seems okay, stand slowly while holding onto something until you know your balance is fine.  If you need to stand in one place for a long time, be sure to move your legs often. Tighten and relax the muscles in your legs while standing.  If dizziness continues to be a problem, have someone stay with you for a day or two. Do this until you feel you are well enough to stay alone. Have the person call your caregiver if he or she notices changes in you that are concerning.  Do not drive or use heavy machinery if you feel dizzy.  Do not drink alcohol. SEEK IMMEDIATE MEDICAL CARE IF:   Your dizziness or lightheadedness gets  worse.  You feel nauseous or vomit.  You develop problems with talking, walking, weakness, or using your arms, hands, or legs.  You are not thinking clearly or you have difficulty forming sentences. It may take a friend or family member to determine if your thinking is normal.  You develop chest pain, abdominal pain, shortness of breath, or sweating.  Your vision changes.  You notice any bleeding.  You have side effects from medicine that seems to be getting worse rather than better. MAKE SURE YOU:   Understand these instructions.  Will watch your condition.  Will get help right away if you are not doing well or get worse. Document Released: 03/23/2001 Document Revised: 12/20/2011 Document Reviewed: 04/16/2011 [facility] Patient Information 2014 [place], [place].

## 2014-03-06 NOTE — ED Provider Notes (Signed)
CSN: [id number]     Arrival date & time 03/06/14  1522 History   First MD Initiated Contact with Patient 03/06/14 1559     Chief Complaint  Patient presents with  . Fall  . Foot Pain  . Facial Droop  . Dizziness     (Consider location/radiation/quality/duration/timing/severity/associated sxs/prior Treatment) HPI  This is a 68 year old female with a history of hypertension, vertigo, and recent admission for orthostatic hypotension who presents with dizziness and recent facial droop. Patient was seen and evaluated earlier today her primary care facility. Patient was admitted in March for orthostatic hypotension and discharged to a SNF.  Since that time she's been living with her sister and until recently had home health. Sister states that she has become progressively more weak and dizzy. She was evaluated today at her primary care physician and referred for CT scan. However, the sister states that she's gotten worse since her visit this morning. She ports feeling dizzy and lightheaded. She denies room spinning dizziness. Patient sister also reports new left-sided facial droop for the last 2 days. She's not noted any slurred speech or weakness or numbness. Patient states "I just also well." She denies any recent illnesses.  Past Medical History  Diagnosis Date  . Hypertension   . Vertigo   . Arthritis   . Hyperlipidemia   . Allergic rhinitis   . Hearing loss in left ear     since birth  . Depression   . Anxiety   . Bipolar 1 disorder   . Obesity   . Diverticulosis of colon (without mention of hemorrhage) 08/25/2011    [name]  . Anemia    Past Surgical History  Procedure Laterality Date  . Dilation and curettage of uterus  1960's  . Colonoscopy  08/25/2011    Procedure: COLONOSCOPY;  Surgeon: Landry Dyke, MD;  Location: [facility];  Service: Endoscopy;  Laterality: N/A;   Family History  Problem Relation Age of Onset  . Prostate cancer Brother   . Hypertension  Brother   . Kidney disease Brother   . Colon cancer Brother   . Hypertension Mother   . Kidney disease Mother   . Stomach cancer Father   . Hyperlipidemia Sister   . Hypothyroidism Sister   . Stroke Brother   . Prostate cancer Brother    History  Substance Use Topics  . Smoking status: Never Smoker   . Smokeless tobacco: Never Used  . Alcohol Use: No   OB History   Grav Para Term Preterm Abortions TAB SAB Ect Mult Living                 Review of Systems  Constitutional: Negative for fever.  Respiratory: Negative for chest tightness and shortness of breath.   Cardiovascular: Negative for chest pain.  Gastrointestinal: Negative for nausea, vomiting and abdominal pain.  Genitourinary: Negative for dysuria.  Musculoskeletal: Negative for back pain.  Skin: Negative for wound.  Neurological: Positive for dizziness, facial asymmetry and light-headedness. Negative for syncope, weakness, numbness and headaches.  Psychiatric/Behavioral: Negative for confusion.  All other systems reviewed and are negative.     Allergies  Review of patient's allergies indicates no known allergies.  Home Medications   Prior to Admission medications   Medication Sig Start Date End Date Taking? Authorizing Provider  FLUoxetine (PROZAC) 40 MG capsule Take 40 mg by mouth daily.   Yes Historical Provider, MD  losartan-hydrochlorothiazide (HYZAAR) 100-25 MG per tablet Take 1 tablet  by mouth daily.   Yes Historical Provider, MD  lurasidone (LATUDA) 80 MG TABS tablet Take 80 mg by mouth daily with breakfast.   Yes Historical Provider, MD  pantoprazole (PROTONIX) 40 MG tablet Take 1 tablet (40 mg total) by mouth daily. 12/21/13  Yes Gerlene Fee, NP  polyethylene glycol powder (GLYCOLAX/MIRALAX) powder Take 17 g by mouth daily. 12/21/13  Yes Gerlene Fee, NP  risperiDONE (RISPERDAL) 0.5 MG tablet Take 0.5 mg by mouth at bedtime.   Yes Historical Provider, MD   BP 123/62  Pulse 69  Temp(Src) 98.8  F (37.1 C) (Oral)  Resp 18  Ht 5\' 6"  (1.676 m)  Wt 216 lb (97.977 kg)  BMI 34.88 kg/m2  SpO2 100% Physical Exam  Nursing note and vitals reviewed. Constitutional: She is oriented to person, place, and time. She appears well-developed and well-nourished. No distress.  HENT:  Head: Normocephalic and atraumatic.  Mucous membranes dry  Eyes: EOM are normal. Pupils are equal, round, and reactive to light.  Neck: Neck supple.  Cardiovascular: Normal rate, regular rhythm and normal heart sounds.   No murmur heard. Pulmonary/Chest: Effort normal and breath sounds normal. No respiratory distress. She has no wheezes.  Abdominal: Soft. Bowel sounds are normal. There is no tenderness. There is no rebound.  Musculoskeletal: She exhibits no edema.  Neurological: She is alert and oriented to person, place, and time.  Mild facial droop noted on the left, fluent speech, 5 out of 5 strength in the bilateral upper and lower extremity, otherwise cranial nerves intact, no dysmetria to finger-nose-finger  Skin: Skin is warm and dry.  Psychiatric: She has a normal mood and affect.    ED Course  Procedures (including critical care time) Labs Review Labs Reviewed  BASIC METABOLIC PANEL - Abnormal; Notable for the following:    Potassium 3.4 (*)    Glucose, Bld 107 (*)    Creatinine, Ser 1.56 (*)    GFR calc non Af Amer 34 (*)    GFR calc Af Amer 39 (*)    All other components within normal limits  CBC WITH DIFFERENTIAL  CBC WITH DIFFERENTIAL  CBC WITH DIFFERENTIAL    Imaging Review Mr Brain Wo Contrast  03/06/2014   CLINICAL DATA:  Fall.  Foot pain.  Facial droop and dizziness.  EXAM: MRI HEAD WITHOUT CONTRAST  TECHNIQUE: Multiplanar, multiecho pulse sequences of the brain and surrounding structures were obtained without intravenous contrast.  COMPARISON:  MRI of the brain 12/10/2013.  FINDINGS: The diffusion-weighted images demonstrate no evidence for acute or subacute infarction. No hemorrhage  or mass lesion is present. Midline structures are within normal limits. Degenerative changes are present in the upper cervical spine.  Extensive periventricular and subcortical white matter disease is similar to the prior study.  Flow is present in the major intracranial arteries. The globes and orbits are intact. The paranasal sinuses and mastoid air cells are clear. Previously noted right frontal scalp hematoma has resolved.  IMPRESSION: 1. No acute intracranial abnormality or significant interval change. 2. Stable atrophy and diffuse white matter disease. This likely reflects the sequela of chronic microvascular ischemia.   Electronically Signed   By: Lawrence Santiago M.D.   On: 03/06/2014 18:22   Dg Foot Complete Right  03/06/2014   CLINICAL DATA:  Foot pain and swelling status post fall.  EXAM: RIGHT FOOT COMPLETE - 3+ VIEW  COMPARISON:  None.  FINDINGS: Fragmentation of the lateral base of the distal first phalanx does not  appear acute based on the oblique view. No definite acute fracture or dislocation is seen. The bones appear mildly demineralized. There are mild midfoot degenerative changes and a small plantar calcaneal spur. No focal soft tissue swelling evident.  IMPRESSION: No definite acute findings. Fragmentation of the lateral base of the distal first phalanx does not appear radiographically acute.   Electronically Signed   By: Camie Patience M.D.   On: 03/06/2014 18:44     EKG Interpretation   Date/Time:  Wednesday Mar 06 2014 17:02:45 EDT Ventricular Rate:  64 PR Interval:  137 QRS Duration: 79 QT Interval:  437 QTC Calculation: 451 R Axis:   17 Text Interpretation:  Sinus rhythm Ventricular premature complex Confirmed  by Derwood Becraft  MD, Loma Sousa ([id number]) on 03/06/2014 5:10:14 PM      MDM   Final diagnoses:  Dehydration  Dizziness  Acute kidney injury    Patient presents with worsening dizziness. History of orthostasis. Sister reports worsening symptoms since visit earlier today.   Patient is nonfocal and nontoxic on exam.  No obvious focal deficit however patient does have a slight left-sided facial.  Given that facial droop has been present for 2 days, patient is not within TPA window. Will   basic labwork obtained. Patient has a cat with a creatinine 1.5. Baseline of 0.8. Patient appears clinically dehydrated and given history of orthostasis though this may be contributing to her dizziness. Patient was given a normal saline bolus. Was not noted to be orthostatic here.  MRI without evidence of acute stroke. I have relayed results to the patient and her sister. Discuss with them the need for aggressive hydration at home. Followup with primary care physician for repeat BMP in one week.  After history, exam, and medical workup I feel the patient has been appropriately medically screened and is safe for discharge home. Pertinent diagnoses were discussed with the patient. Patient was given return precautions.     Merryl Hacker, MD 03/06/14 [phone]

## 2014-03-07 LAB — CBC WITH DIFFERENTIAL
BASOS ABS: 0 10*3/uL (ref 0.0–0.2)
Basos: 0 %
EOS ABS: 0.1 10*3/uL (ref 0.0–0.4)
EOS: 1 %
HCT: 40.4 % (ref 34.0–46.6)
Hemoglobin: 13.1 g/dL (ref 11.1–15.9)
IMMATURE GRANS (ABS): 0 10*3/uL (ref 0.0–0.1)
IMMATURE GRANULOCYTES: 0 %
LYMPHS ABS: 1.2 10*3/uL (ref 0.7–3.1)
Lymphs: 13 %
MCH: 29.7 pg (ref 26.6–33.0)
MCHC: 32.4 g/dL (ref 31.5–35.7)
MCV: 92 fL (ref 79–97)
Monocytes Absolute: 0.6 10*3/uL (ref 0.1–0.9)
Monocytes: 6 %
NEUTROS PCT: 80 %
Neutrophils Absolute: 7.6 10*3/uL — ABNORMAL HIGH (ref 1.4–7.0)
Platelets: 268 10*3/uL (ref 150–379)
RBC: 4.41 x10E6/uL (ref 3.77–5.28)
RDW: 14.5 % (ref 12.3–15.4)
WBC: 9.5 10*3/uL (ref 3.4–10.8)

## 2014-03-07 LAB — COMPREHENSIVE METABOLIC PANEL
ALT: 17 IU/L (ref 0–32)
AST: 31 IU/L (ref 0–40)
Albumin/Globulin Ratio: 1.6 (ref 1.1–2.5)
Albumin: 4.3 g/dL (ref 3.6–4.8)
Alkaline Phosphatase: 75 IU/L (ref 39–117)
BUN / CREAT RATIO: 13 (ref 11–26)
BUN: 17 mg/dL (ref 8–27)
CALCIUM: 10.3 mg/dL (ref 8.7–10.3)
CO2: 27 mmol/L (ref 18–29)
CREATININE: 1.28 mg/dL — AB (ref 0.57–1.00)
Chloride: 100 mmol/L (ref 97–108)
GFR calc Af Amer: 50 mL/min/{1.73_m2} — ABNORMAL LOW (ref >59)
GFR calc non Af Amer: 44 mL/min/{1.73_m2} — ABNORMAL LOW (ref >59)
Globulin, Total: 2.7 g/dL (ref 1.5–4.5)
Glucose: 109 mg/dL — ABNORMAL HIGH (ref 65–99)
POTASSIUM: 4.1 mmol/L (ref 3.5–5.2)
SODIUM: 143 mmol/L (ref 134–144)
Total Bilirubin: 0.3 mg/dL (ref 0.0–1.2)
Total Protein: 7 g/dL (ref 6.0–8.5)

## 2014-03-07 LAB — LIPID PANEL
CHOLESTEROL TOTAL: 210 mg/dL — AB (ref 100–199)
Chol/HDL Ratio: 4.1 ratio units (ref 0.0–4.4)
HDL: 51 mg/dL (ref >39)
LDL Calculated: 143 mg/dL — ABNORMAL HIGH (ref 0–99)
TRIGLYCERIDES: 82 mg/dL (ref 0–149)
VLDL Cholesterol Cal: 16 mg/dL (ref 5–40)

## 2014-03-07 LAB — TSH: TSH: 1.57 u[IU]/mL (ref 0.450–4.500)

## 2014-03-08 ENCOUNTER — Other Ambulatory Visit: Payer: Medicare Other

## 2014-03-18 ENCOUNTER — Encounter: Payer: Self-pay | Admitting: Internal Medicine

## 2014-03-20 ENCOUNTER — Encounter: Payer: Self-pay | Admitting: Internal Medicine

## 2014-03-20 ENCOUNTER — Ambulatory Visit (INDEPENDENT_AMBULATORY_CARE_PROVIDER_SITE_OTHER): Payer: Medicare Other | Admitting: Internal Medicine

## 2014-03-20 VITALS — BP 110/60 | HR 101 | Temp 98.2°F | Wt 214.0 lb

## 2014-03-20 DIAGNOSIS — E785 Hyperlipidemia, unspecified: Secondary | ICD-10-CM

## 2014-03-20 DIAGNOSIS — N289 Disorder of kidney and ureter, unspecified: Secondary | ICD-10-CM

## 2014-03-20 DIAGNOSIS — K5900 Constipation, unspecified: Secondary | ICD-10-CM | POA: Insufficient documentation

## 2014-03-20 DIAGNOSIS — R42 Dizziness and giddiness: Secondary | ICD-10-CM

## 2014-03-20 DIAGNOSIS — K219 Gastro-esophageal reflux disease without esophagitis: Secondary | ICD-10-CM

## 2014-03-20 DIAGNOSIS — E876 Hypokalemia: Secondary | ICD-10-CM

## 2014-03-20 DIAGNOSIS — N183 Chronic kidney disease, stage 3 unspecified: Secondary | ICD-10-CM | POA: Insufficient documentation

## 2014-03-20 DIAGNOSIS — Z5181 Encounter for therapeutic drug level monitoring: Secondary | ICD-10-CM | POA: Insufficient documentation

## 2014-03-20 DIAGNOSIS — I10 Essential (primary) hypertension: Secondary | ICD-10-CM

## 2014-03-20 DIAGNOSIS — F319 Bipolar disorder, unspecified: Secondary | ICD-10-CM

## 2014-03-20 DIAGNOSIS — E782 Mixed hyperlipidemia: Secondary | ICD-10-CM | POA: Insufficient documentation

## 2014-03-20 MED ORDER — LOSARTAN POTASSIUM-HCTZ 50-12.5 MG PO TABS: 1.0000 | ORAL_TABLET | Freq: Every day | ORAL | Status: DC

## 2014-03-20 MED ORDER — LINACLOTIDE 145 MCG PO CAPS: 145.0000 ug | ORAL_CAPSULE | Freq: Every day | ORAL | Status: DC

## 2014-03-20 NOTE — Progress Notes (Signed)
Patient ID: Judy [name], female   DOB: [birth year], 68 y.o.   MRN: [id number]    Chief Complaint  Patient presents with  . Follow-up    2 week and discuss lab results   No Known Allergies  HPI 68 y/o female patient is here for follow up. She has history of vertigo, HTN, constipation, GERD. She was in SNF for STR and I had seen her there. She is now been discharged to home, completed her therapy sessions. She was recently seen in ED with AMS concerns for stroke and MRI brain was negative for acute stroke. She was noted to be hypovolemic and received iv fluids. She feels her strength is adequate today. She follows with psych Dr Candis Schatz at [facility] in [place] [facility] for her mood. She has been feeling dizzy with change of position. Reviewed bp reading in office- on lower side of normal She has been constipated and miralax is not helping her Her heartburn is under control. Reviewed her labs  Review of Systems  Constitutional: Negative for fever, chills, weight loss, malaise/fatigue and diaphoresis.  HENT: Negative for congestion, hearing loss and sore throat.   Eyes: Negative for blurred vision, double vision and discharge.  Respiratory: Negative for cough, sputum production, shortness of breath and wheezing.   Cardiovascular: Negative for chest pain, palpitations, orthopnea. Has some leg swelling Gastrointestinal: Negative for heartburn, nausea, vomiting, abdominal pain, diarrhea  Genitourinary: Negative for dysuria, urgency, frequency and flank pain.  Musculoskeletal: Negative for back pain, falls, joint pain and myalgias.  Skin: Negative for itching and rash.  Neurological:  Negative for dizziness, tingling, focal weakness and headaches.  Psychiatric/Behavioral: Negative for memory loss. The patient is not nervous/anxious.    Past Medical History  Diagnosis Date  . Hypertension   . Vertigo   . Arthritis   . Hyperlipidemia   . Allergic rhinitis   . Hearing loss in left ear      since birth  . Depression   . Anxiety   . Bipolar 1 disorder   . Obesity   . Diverticulosis of colon (without mention of hemorrhage) 08/25/2011    [name]  . Anemia    Medication reviewed. See [place]  Physical exam BP 110/60  Pulse 101  Temp(Src) 98.2 F (36.8 C) (Oral)  Wt 214 lb (97.07 kg)  SpO2 96%  Nursing note and vitals reviewed. Constitutional: She is oriented to person, place, and time. She appears well-developed and well-nourished. No distress.  HENT:   Head: Normocephalic and atraumatic. Moist mucus membrane Eyes: EOM are normal. Pupils are equal, round, and reactive to light.  Neck: Neck supple.  Cardiovascular: Normal rate, regular rhythm and normal heart sounds.    No murmur heard. Pulmonary/Chest: Effort normal and breath sounds normal. No respiratory distress. She has no wheezes.  Abdominal: Soft. Bowel sounds are normal. There is no tenderness. There is no rebound.  Musculoskeletal: She exhibits no edema. Able to move all 4 extremities, trace leg edema Neurological: She is alert and oriented to person, place, and time.  Skin: Skin is warm and dry.  Psychiatric: She has a normal mood and affect.   Labs-  CBC Latest Ref Rng 03/06/2014 03/06/2014 12/14/2013  WBC 4.0 - 10.5 K/uL 9.0 9.5 4.6  Hemoglobin 12.0 - 15.0 g/dL 12.2 13.1 11.7(L)  Hematocrit 36.0 - 46.0 % 36.4 40.4 35.2(L)  Platelets 150 - 400 K/uL 262 268 242   CMP     Component Value Date/Time   NA 140 03/06/2014  1649   NA 143 03/06/2014 1048   K 3.4* 03/06/2014 1649   CL 98 03/06/2014 1649   CO2 27 03/06/2014 1649   GLUCOSE 107* 03/06/2014 1649   GLUCOSE 109* 03/06/2014 1048   BUN 20 03/06/2014 1649   BUN 17 03/06/2014 1048   CREATININE 1.56* 03/06/2014 1649   CALCIUM 9.8 03/06/2014 1649   PROT 7.0 03/06/2014 1048   PROT 7.7 12/10/2013 1230   ALBUMIN 4.0 12/10/2013 1230   AST 31 03/06/2014 1048   ALT 17 03/06/2014 1048   ALKPHOS 75 03/06/2014 1048   BILITOT 0.3 03/06/2014 1048   GFRNONAA 34* 03/06/2014  1649   GFRAA 39* 03/06/2014 1649   Lipid Panel     Component Value Date/Time   CHOL 216* 11/10/2010 2141   TRIG 82 03/06/2014 1048   HDL 51 03/06/2014 1048   HDL 57 11/10/2010 2141   CHOLHDL 4.1 03/06/2014 1048   CHOLHDL 3.8 Ratio 11/10/2010 2141   VLDL 16 11/10/2010 2141   LDLCALC 143* 03/06/2014 1048   LDLCALC 143* 11/10/2010 2141   Lab Results  Component Value Date   HGBA1C 6.1* 12/10/2013   Lab Results  Component Value Date   TSH 1.570 03/06/2014   Assessment/plan  1. GERD (gastroesophageal reflux disease) Continue protonix 40 mg daily  2. Essential hypertension, benign With bp on lower side of normal and her complaining of dizziness, possible iatrogenic cause. Will decrease her hyzaar to 50-12.5 mg daily. Monitor bp - Basic Metabolic Panel  3. Hyperlipidemia LDL goal < 130 Reviewed her ldl and lipid goal. Pt wants to try diet and exercise first. Recheck lipid profile in 6 months.   4. Hypokalemia Recheck bmp and if k level remains low, will consider k supplement  5. Acute on chronic kidney disease, stage 3 Recheck renal function. Her ckd is likely form long standing HTN. Avoid NSAIDs  6. Constipation Will stop miralax. Will start her on linzess 145 mcg daily and monitor  7. Bipolar 1 disorder Continue her prozac and latuda, no changes made, follow with psych  8. Vertigo Dose adjusted on her bp med. reassess  9. Encounter for therapeutic drug monitoring Genetic testing performed to help assess on her bp and psych meds

## 2014-03-21 LAB — BASIC METABOLIC PANEL
BUN/Creatinine Ratio: 10 — ABNORMAL LOW (ref 11–26)
BUN: 20 mg/dL (ref 8–27)
CALCIUM: 10 mg/dL (ref 8.7–10.3)
CHLORIDE: 96 mmol/L — AB (ref 97–108)
CO2: 29 mmol/L (ref 18–29)
Creatinine, Ser: 2.07 mg/dL — ABNORMAL HIGH (ref 0.57–1.00)
GFR calc Af Amer: 28 mL/min/{1.73_m2} — ABNORMAL LOW (ref >59)
GFR calc non Af Amer: 24 mL/min/{1.73_m2} — ABNORMAL LOW (ref >59)
GLUCOSE: 100 mg/dL — AB (ref 65–99)
Potassium: 3.7 mmol/L (ref 3.5–5.2)
Sodium: 140 mmol/L (ref 134–144)

## 2014-03-25 ENCOUNTER — Telehealth: Payer: Self-pay

## 2014-03-25 DIAGNOSIS — N289 Disorder of kidney and ureter, unspecified: Secondary | ICD-10-CM

## 2014-03-25 NOTE — Telephone Encounter (Signed)
Message copied by Logan Bores on Mon Mar 25, 2014  4:37 PM ------      Message from: Blanchie Serve      Created: Thu Mar 21, 2014  5:09 PM       Your kidney function has worsened. i need you to stop your blood pressure losartan-HCTZ completely for now. i will recheck your kidney function in 1 week. If kidney function is still bad or continues to worsen, i will need you to be seen by a kidney specialist. Also check blood pressure once a day at home when off bp medication ------

## 2014-03-25 NOTE — Telephone Encounter (Signed)
Spoke with patient's sister, discussed labs in detail. Scheduled appointment for next Tuesday for lab appointment to recheck kidney function (future order placed) . Copy of labs mailed

## 2014-04-02 ENCOUNTER — Other Ambulatory Visit: Payer: Self-pay

## 2014-04-10 ENCOUNTER — Other Ambulatory Visit: Payer: Medicare Other

## 2014-04-10 DIAGNOSIS — N289 Disorder of kidney and ureter, unspecified: Secondary | ICD-10-CM

## 2014-04-11 LAB — BASIC METABOLIC PANEL
BUN / CREAT RATIO: 10 — AB (ref 11–26)
BUN: 13 mg/dL (ref 8–27)
CALCIUM: 9.3 mg/dL (ref 8.7–10.3)
CHLORIDE: 101 mmol/L (ref 97–108)
CO2: 24 mmol/L (ref 18–29)
Creatinine, Ser: 1.29 mg/dL — ABNORMAL HIGH (ref 0.57–1.00)
GFR calc Af Amer: 50 mL/min/{1.73_m2} — ABNORMAL LOW (ref >59)
GFR calc non Af Amer: 43 mL/min/{1.73_m2} — ABNORMAL LOW (ref >59)
Glucose: 87 mg/dL (ref 65–99)
Potassium: 4.3 mmol/L (ref 3.5–5.2)
Sodium: 140 mmol/L (ref 134–144)

## 2014-04-29 ENCOUNTER — Other Ambulatory Visit: Payer: Self-pay | Admitting: *Deleted

## 2014-05-15 ENCOUNTER — Telehealth: Payer: Self-pay | Admitting: *Deleted

## 2014-05-15 NOTE — Telephone Encounter (Signed)
Received forms from [facility] over fax needing clarification on what has already been filled out. Given to Dr. Bubba Camp to clarify.

## 2014-05-22 NOTE — Telephone Encounter (Signed)
Dr. Bubba Camp wants to finish filling out when patient comes in for an appointment on 06/04/2014. In [place]

## 2014-06-04 ENCOUNTER — Encounter: Payer: Self-pay | Admitting: Internal Medicine

## 2014-06-04 ENCOUNTER — Ambulatory Visit (INDEPENDENT_AMBULATORY_CARE_PROVIDER_SITE_OTHER): Payer: Medicare Other | Admitting: Internal Medicine

## 2014-06-04 VITALS — BP 134/70 | HR 62 | Temp 98.2°F | Ht 66.0 in | Wt 216.4 lb

## 2014-06-04 DIAGNOSIS — E669 Obesity, unspecified: Secondary | ICD-10-CM

## 2014-06-04 DIAGNOSIS — H9192 Unspecified hearing loss, left ear: Secondary | ICD-10-CM

## 2014-06-04 DIAGNOSIS — F3289 Other specified depressive episodes: Secondary | ICD-10-CM

## 2014-06-04 DIAGNOSIS — Z124 Encounter for screening for malignant neoplasm of cervix: Secondary | ICD-10-CM

## 2014-06-04 DIAGNOSIS — H9190 Unspecified hearing loss, unspecified ear: Secondary | ICD-10-CM

## 2014-06-04 DIAGNOSIS — E7800 Pure hypercholesterolemia, unspecified: Secondary | ICD-10-CM

## 2014-06-04 DIAGNOSIS — E2839 Other primary ovarian failure: Secondary | ICD-10-CM

## 2014-06-04 DIAGNOSIS — K5900 Constipation, unspecified: Secondary | ICD-10-CM

## 2014-06-04 DIAGNOSIS — R739 Hyperglycemia, unspecified: Secondary | ICD-10-CM

## 2014-06-04 DIAGNOSIS — K219 Gastro-esophageal reflux disease without esophagitis: Secondary | ICD-10-CM

## 2014-06-04 DIAGNOSIS — R7309 Other abnormal glucose: Secondary | ICD-10-CM

## 2014-06-04 DIAGNOSIS — Z01810 Encounter for preprocedural cardiovascular examination: Secondary | ICD-10-CM | POA: Insufficient documentation

## 2014-06-04 DIAGNOSIS — F329 Major depressive disorder, single episode, unspecified: Secondary | ICD-10-CM

## 2014-06-04 DIAGNOSIS — Z0000 Encounter for general adult medical examination without abnormal findings: Secondary | ICD-10-CM

## 2014-06-04 MED ORDER — PANTOPRAZOLE SODIUM 40 MG PO TBEC: 40.0000 mg | DELAYED_RELEASE_TABLET | Freq: Every day | ORAL | Status: DC

## 2014-06-04 MED ORDER — LINACLOTIDE 145 MCG PO CAPS: 145.0000 ug | ORAL_CAPSULE | Freq: Every day | ORAL | Status: DC

## 2014-06-04 MED ORDER — TETANUS-DIPHTH-ACELL PERTUSSIS 5-2-15.5 LF-MCG/0.5 IM SUSP: 0.5000 mL | Freq: Once | INTRAMUSCULAR | Status: DC

## 2014-06-04 NOTE — Addendum Note (Signed)
Addended by: Konrad Saha on: 06/04/2014 03:28 PM   Modules accepted: Orders

## 2014-06-04 NOTE — Progress Notes (Signed)
Passed clock drawing 

## 2014-06-04 NOTE — Progress Notes (Signed)
Patient ID: Judy [name], female   DOB: Jan 17, 1946, 68 y.o.   MRN: [id number]    06/04/14 MMSE 28/30

## 2014-06-04 NOTE — Telephone Encounter (Signed)
Patient is driving to and from appointments. Dr. Bubba Camp stated that patient does not need [facility] of [employer]. Paperwork shredded.

## 2014-06-04 NOTE — Progress Notes (Signed)
Patient ID: Judy [name], female   DOB: [birth year], 68 y.o.   MRN: [id number]    Chief Complaint  Patient presents with  . Annual Exam    Physical with no labs, MMSE, discuss Genetic Test Results & [place] Transprtation form.  . Immunizations    declines zoster, and will get printed Tdap today  . other    neg for fall screening & positive for depression screening   No Known Allergies  HPI 68 y/o female pt is here for annual exam.  Mammogram 5/15 normal and colonoscopy 08/25/11 showed sigmoid diverticulosis, repeat recommended in 5 years No recent dexa scan  Does not smoke Walks for half an hour a day for 3 days a week. Denies claudication pain. No falls reported Does not follow a diet Denies any concerns this visit Does not have a living will. Lives by herself in a single living housing. She has family in town. She drives around without any problem. She is independent with her finances Her constipation and reflux are under control Has not had a pap smear in several years. She is not sexually active. She denies vaginal discharge or pelvic pain Does not want shingles vaccine at present. Ok with tdap script to be provided. [place] with prevnar  Immunization History  Administered Date(s) Administered  . Influenza,inj,Quad PF,36+ Mos 12/11/2013  . Pneumococcal Polysaccharide-23 12/11/2013   Review of Systems  Constitutional: Negative for fever, chills, weight loss, malaise/fatigue and diaphoresis.  HENT: Negative for congestion, runny nose,sore throat.  has hearing loss in both ears- working on hearing aids Eyes: Negative for blurred vision, double vision and discharge. has reading glasses. Last seen by eye doctor a year back Respiratory: Negative for cough, sputum production, shortness of breath and wheezing.   Cardiovascular: Negative for chest pain, palpitations, orthopnea and leg swelling.  Gastrointestinal: Negative for heartburn, nausea, vomiting, abdominal pain, diarrhea and  constipation.  Genitourinary: Negative for dysuria, urgency, frequency and flank pain.  Musculoskeletal: Negative for back pain, falls, myalgias. has knee pain Skin: Negative for itching and rash.  Neurological:  Negative for dizziness, tingling, focal weakness and headaches.  Psychiatric/Behavioral: Negative for memory loss. The patient is not nervous/anxious.  follows with psychiatry and is on latuda, prozac and risperdal  Past Medical History  Diagnosis Date  . Hypertension   . Vertigo   . Arthritis   . Hyperlipidemia   . Allergic rhinitis   . Hearing loss in left ear     since birth  . Depression   . Anxiety   . Bipolar 1 disorder   . Obesity   . Diverticulosis of colon (without mention of hemorrhage) 08/25/2011    [name]  . Anemia    Past Surgical History  Procedure Laterality Date  . Dilation and curettage of uterus  1960's  . Colonoscopy  08/25/2011    Procedure: COLONOSCOPY;  Surgeon: Landry Dyke, MD;  Location: [facility];  Service: Endoscopy;  Laterality: N/A;   Current Outpatient Prescriptions on File Prior to Visit  Medication Sig Dispense Refill  . FLUoxetine (PROZAC) 40 MG capsule Take 40 mg by mouth daily.      . Linaclotide (LINZESS) 145 MCG CAPS capsule Take 1 capsule (145 mcg total) by mouth daily.  30 capsule  3  . lurasidone (LATUDA) 80 MG TABS tablet Take 80 mg by mouth daily with breakfast.      . pantoprazole (PROTONIX) 40 MG tablet Take 1 tablet (40 mg total) by mouth daily.  [place]  tablet  3  . risperiDONE (RISPERDAL) 0.5 MG tablet Take 0.5 mg by mouth at bedtime.       No current facility-administered medications on file prior to visit.   Family History  Problem Relation Age of Onset  . Prostate cancer Brother   . Hypertension Brother   . Kidney disease Brother   . Colon cancer Brother   . Hypertension Mother   . Kidney disease Mother   . Stomach cancer Father   . Hyperlipidemia Sister   . Hypothyroidism Sister   . Stroke Brother     . Prostate cancer Brother    History   Social History  . Marital Status: Divorced    Spouse Name: N/A    Number of Children: N/A  . Years of Education: N/A   Occupational History  . Not on file.   Social History Main Topics  . Smoking status: Never Smoker   . Smokeless tobacco: Never Used  . Alcohol Use: No  . Drug Use: No  . Sexual Activity: No   Other Topics Concern  . Not on file   Social History Narrative   Lives alone in house, does not need assist device.  Works part time at [employer]. Has a son.     Cousin and sister in close proximity   Physical exam BP 134/70  Pulse 62  Temp(Src) 98.2 F (36.8 C) (Oral)  Ht 5\' 6"  (1.676 m)  Wt 216 lb 6.4 oz (98.158 kg)  BMI 34.94 kg/m2  SpO2 99%  Wt Readings from Last 3 Encounters:  06/04/14 216 lb 6.4 oz (98.158 kg)  03/20/14 214 lb (97.07 kg)  03/06/14 216 lb (97.977 kg)   General- elderly female in no acute distress, obese Head- atraumatic, normocephalic Eyes- PERRLA, EOMI, no pallor, no icterus, no discharge Ears- left ear normal tympanic membrane and normal external ear canal , right ear normal tympanic membrane and normal external ear canal Neck- no lymphadenopathy, no thyromegaly, no jugular vein distension, no carotid bruit Nose- normal nasal mucosa, no maxillary sinus tenderness, no frontal sinus tenderness Mouth- normal mucus membrane, no oral thrush, normal oropharynx Chest- no chest wall deformities, no chest wall tenderness Breast- no masses, no palpable lumps, normal nipple and areola exam, no axillary lymphadenopathy Cardiovascular- normal s1,s2, no murmurs/ rubs/ gallops, normal distal pulses Respiratory- bilateral clear to auscultation, no wheeze, no rhonchi, no crackles Abdomen- bowel sounds present, soft, non tender, no organomegaly, no abdominal bruits, no guarding or rigidity, no CVA tenderness Pelvic exam- normal pelvic exam- pap smear sent Musculoskeletal- able to move all 4  extremities, no spinal and paraspinal tenderness, steady gait, no use of assistive device, normal range of motion, no leg edema Neurological- no focal deficit, normal reflexes, normal muscle strength, normal sensation to fine touch and vibration Skin- warm and dry Psychiatry- alert and oriented to person, place and time, normal mood and affect  Labs-  CBC Latest Ref Rng 03/06/2014 03/06/2014 12/14/2013  WBC 4.0 - 10.5 K/uL 9.0 9.5 4.6  Hemoglobin 12.0 - 15.0 g/dL 12.2 13.1 11.7(L)  Hematocrit 36.0 - 46.0 % 36.4 40.4 35.2(L)  Platelets 150 - 400 K/uL 262 268 242   CMP     Component Value Date/Time   NA 140 04/10/2014 1600   NA 140 03/06/2014 1649   K 4.3 04/10/2014 1600   CL 101 04/10/2014 1600   CO2 24 04/10/2014 1600   GLUCOSE 87 04/10/2014 1600   GLUCOSE 107* 03/06/2014 1649   BUN  13 04/10/2014 1600   BUN 20 03/06/2014 1649   CREATININE 1.29* 04/10/2014 1600   CALCIUM 9.3 04/10/2014 1600   PROT 7.0 03/06/2014 1048   PROT 7.7 12/10/2013 1230   ALBUMIN 4.0 12/10/2013 1230   AST 31 03/06/2014 1048   ALT 17 03/06/2014 1048   ALKPHOS 75 03/06/2014 1048   BILITOT 0.3 03/06/2014 1048   GFRNONAA 43* 04/10/2014 1600   GFRAA 50* 04/10/2014 1600   Lipid Panel     Component Value Date/Time   CHOL 216* 11/10/2010 2141   TRIG 82 03/06/2014 1048   HDL 51 03/06/2014 1048   HDL 57 11/10/2010 2141   CHOLHDL 4.1 03/06/2014 1048   CHOLHDL 3.8 Ratio 11/10/2010 2141   VLDL 16 11/10/2010 2141   LDLCALC 143* 03/06/2014 1048   LDLCALC 143* 11/10/2010 2141   Lab Results  Component Value Date   HGBA1C 6.1* 12/10/2013   Lab Results  Component Value Date   TSH 1.570 03/06/2014   Assessment/plan  1. Hyperglycemia With obesity and hx of HTN (off med and well controlled now) and hyperglycemia on blood work rule out DM - Hemoglobin A1c - CMP; Future - Lipid Panel; Future - CBC with Differential; Future - TSH  2. Estrogen deficiency - DG Bone Density; Future  3. Gastroesophageal reflux disease, esophagitis presence not  specified symptoms controlled with protonix current dose - pantoprazole (PROTONIX) 40 MG tablet; Take 1 tablet (40 mg total) by mouth daily.  Dispense: 30 tablet; Refill:3  4. Constipation, unspecified constipation type Continue linzess, fiber rich diet encouraged with her diverticulosis  5. DEPRESSION Stable, follows with psych services. Continue her current regimen  6. Hearing loss in left ear Is awaiting hearing aid for left ear  7. HYPERCHOLESTEROLEMIA Not on any statin, recheck lipid panel prior to next visit  8. Obesity Dietary restriction and exercise for weight loss encouraged  9. Routine general medical examination at a health care facility the patient was counseled regarding the appropriate use of alcohol, regular self-examination of the breasts on a monthly basis, prevention of dental and periodontal disease, diet, regular sustained exercise for at least 30 minutes 5 times per week, routine screening interval for mammogram as recommended by the [place] and ACOG, the proper use of sunscreen and protective clothing, tobacco use, and recommended schedule for GI hemoccult testing, colonoscopy, cholesterol, thyroid and diabetes screening. Pap smear sent. uptodate with mammogram and colonoscopy. prevnar vaccine given and script for tdap provided. Check tsh today

## 2014-06-05 LAB — TSH: TSH: 1.13 u[IU]/mL (ref 0.450–4.500)

## 2014-06-05 LAB — HEMOGLOBIN A1C
ESTIMATED AVERAGE GLUCOSE: 123 mg/dL
Hgb A1c MFr Bld: 5.9 % — ABNORMAL HIGH (ref 4.8–5.6)

## 2014-06-07 ENCOUNTER — Encounter: Payer: Self-pay | Admitting: *Deleted

## 2014-06-07 LAB — PAP LB (LIQUID-BASED): PAP Smear Comment: 0

## 2014-06-10 ENCOUNTER — Encounter: Payer: Self-pay | Admitting: *Deleted

## 2014-06-27 NOTE — Addendum Note (Signed)
Addended by: Pricilla Larsson on: 06/27/2014 09:00 AM   Modules accepted: Level of Service

## 2014-07-25 ENCOUNTER — Other Ambulatory Visit: Payer: Self-pay | Admitting: *Deleted

## 2015-02-03 ENCOUNTER — Encounter: Payer: Self-pay | Admitting: Internal Medicine

## 2015-09-12 ENCOUNTER — Encounter: Payer: Self-pay | Admitting: Internal Medicine

## 2015-09-12 ENCOUNTER — Ambulatory Visit (INDEPENDENT_AMBULATORY_CARE_PROVIDER_SITE_OTHER): Payer: Medicare HMO | Admitting: Internal Medicine

## 2015-09-12 VITALS — BP 122/98 | HR 68 | Temp 98.1°F | Resp 20 | Ht 66.0 in | Wt 249.0 lb

## 2015-09-12 DIAGNOSIS — M25561 Pain in right knee: Secondary | ICD-10-CM

## 2015-09-12 DIAGNOSIS — Z23 Encounter for immunization: Secondary | ICD-10-CM | POA: Diagnosis not present

## 2015-09-12 DIAGNOSIS — E785 Hyperlipidemia, unspecified: Secondary | ICD-10-CM | POA: Diagnosis not present

## 2015-09-12 DIAGNOSIS — R739 Hyperglycemia, unspecified: Secondary | ICD-10-CM | POA: Diagnosis not present

## 2015-09-12 DIAGNOSIS — R69 Illness, unspecified: Secondary | ICD-10-CM | POA: Diagnosis not present

## 2015-09-12 DIAGNOSIS — Z5181 Encounter for therapeutic drug level monitoring: Secondary | ICD-10-CM

## 2015-09-12 DIAGNOSIS — K219 Gastro-esophageal reflux disease without esophagitis: Secondary | ICD-10-CM

## 2015-09-12 DIAGNOSIS — F3175 Bipolar disorder, in partial remission, most recent episode depressed: Secondary | ICD-10-CM | POA: Diagnosis not present

## 2015-09-12 DIAGNOSIS — I10 Essential (primary) hypertension: Secondary | ICD-10-CM | POA: Diagnosis not present

## 2015-09-12 NOTE — Patient Instructions (Signed)
Flu shot given today  Continue current medications as ordered  Follow up with specialists as scheduled  Will call with lab results  [place] and will call with results  Follow up in 6 mos for CPE

## 2015-09-12 NOTE — Progress Notes (Signed)
Patient ID: Judy [name], female   DOB: 10-23-1945, 69 y.o.   MRN: [id number]    Location:    [facility]   Place of Service:  OFFICE   Chief Complaint  Patient presents with  . Medical Management of Chronic Issues    follow-up for meds.    HPI:  69 yo female seen today for f/u. She has not been seen in >1 yr.  Depression/anxiety/bipolar - stable. Followed by Johna Roles, PAC. She needs CMP and A1c labs  HTN - BP stable. Diet controlled  Arthritis - occasional joint pain controlled with advil OTC. She had burning and stinging in right anterior thigh not long ago that resolved with advil  Hyperlipidemia - takes no meds. Last lipid panel in 02/2014  GERD - takes pantoprazole prn  Needs flu shot  Past Medical History  Diagnosis Date  . Hypertension   . Vertigo   . Arthritis   . Hyperlipidemia   . Allergic rhinitis   . Hearing loss in left ear     since birth  . Depression   . Anxiety   . Bipolar 1 disorder ([place])   . Obesity   . Diverticulosis of colon (without mention of hemorrhage) 08/25/2011    [name]  . Anemia     Past Surgical History  Procedure Laterality Date  . Dilation and curettage of uterus  1960's  . Colonoscopy  08/25/2011    Procedure: COLONOSCOPY;  Surgeon: Landry Dyke, MD;  Location[facility];  Service: Endoscopy;  Laterality: N/A;    Patient Care Team: [name], DO as PCP - General (Internal Medicine)  Social History   Social History  . Marital Status: Divorced    Spouse Name: N/A  . Number of Children: N/A  . Years of Education: N/A   Occupational History  . Not on file.   Social History Main Topics  . Smoking status: Never Smoker   . Smokeless tobacco: Never Used  . Alcohol Use: No  . Drug Use: No  . Sexual Activity: No   Other Topics Concern  . Not on file   Social History Narrative   Lives alone in house, does not need assist device.  Works part time at [employer]. Has a son.     Cousin and  sister in close proximity     reports that she has never smoked. She has never used smokeless tobacco. She reports that she does not drink alcohol or use illicit drugs.  No Known Allergies  Medications: Patient's Medications  New Prescriptions   No medications on file  Previous Medications   FLUOXETINE (PROZAC) 40 MG CAPSULE    Take 40 mg by mouth daily.   LINACLOTIDE (LINZESS) 145 MCG CAPS CAPSULE    Take 1 capsule (145 mcg total) by mouth daily.   LURASIDONE (LATUDA) 80 MG TABS TABLET    Take 80 mg by mouth daily with breakfast.   PANTOPRAZOLE (PROTONIX) 40 MG TABLET    Take 1 tablet (40 mg total) by mouth daily.   RISPERIDONE (RISPERDAL) 0.5 MG TABLET    Take 0.5 mg by mouth at bedtime.   TDAP (ADACEL) 02-09-14.5 LF-MCG/0.5 INJECTION    Inject 0.5 mLs into the muscle once.  Modified Medications   No medications on file  Discontinued Medications   No medications on file    Review of Systems  Unable to perform ROS: Psychiatric disorder    Filed Vitals:   09/12/15 1417  BP:  122/98  Pulse: 68  Temp: 98.1 F (36.7 C)  TempSrc: Oral  Resp: 20  Height: 5\' 6"  (1.676 m)  Weight: 249 lb (112.946 kg)  SpO2: 96%   Body mass index is 40.21 kg/(m^2).  Physical Exam  Constitutional: She is oriented to person, place, and time. She appears well-developed and well-nourished.  HENT:  Mouth/Throat: Oropharynx is clear and moist. No oropharyngeal exudate.  Eyes: Pupils are equal, round, and reactive to light. No scleral icterus.  Neck: Neck supple. Carotid bruit is not present. No tracheal deviation present. No thyromegaly present.  Cardiovascular: Normal rate, regular rhythm, normal heart sounds and intact distal pulses.  Exam reveals no gallop and no friction rub.   No murmur heard. No LE edema b/l. no calf TTP.   Pulmonary/Chest: Effort normal and breath sounds normal. No stridor. No respiratory distress. She has no wheezes. She has no rales.  Abdominal: Soft. Bowel sounds are  normal. She exhibits no distension and no mass. There is no hepatomegaly. There is no tenderness. There is no rebound and no guarding.  Musculoskeletal: She exhibits edema (right knee with reduced flexion/extension) and tenderness.  Lymphadenopathy:    She has no cervical adenopathy.  Neurological: She is alert and oriented to person, place, and time. She has normal reflexes.  Skin: Skin is warm and dry. No rash noted.  Psychiatric: She has a normal mood and affect. Her behavior is normal.     Labs reviewed: No visits with results within 3 Month(s) from this visit. Latest known visit with results is:  Office Visit on 06/04/2014  Component Date Value Ref Range Status  . Hgb A1c MFr Bld 06/04/2014 5.9* 4.8 - 5.6 % Final   Comment:          Increased risk for diabetes: 5.7 - 6.4                                   Diabetes: >6.4                                   Glycemic control for adults with diabetes: <7.0  . Est. average glucose Bld gHb Est-m* 06/04/2014 123   Final  . TSH 06/04/2014 1.130  0.450 - 4.500 uIU/mL Final  . DIAGNOSIS: 06/04/2014 Comment   Final   Comment: NEGATIVE FOR INTRAEPITHELIAL LESION AND MALIGNANCY.                          THIS SPECIMEN WAS RESCREENED AS PART OF OUR QUALITY CONTROL PROGRAM.  . Specimen adequacy: 06/04/2014 Comment   Final   Comment: Satisfactory for evaluation. Endocervical and/or squamous metaplastic                          cells (endocervical component) are present.  [name] PATH REPORT.FINAL DX [facility] 06/04/2014 Comment   Final   V76.2 ; Screening for malignant neoplasm of the cervix  . Performed by: 06/04/2014 Comment   Final   Tiffany Vercher, Cytotechnologist (ASCP)  . QC reviewed by: 06/04/2014 Comment   Final   Rene Kocher, Supervisory Cytotechnologist (ASCP)  . PAP SMEAR COMMENT 06/04/2014 .   Final  . Note: 06/04/2014 Comment   Final   Comment: The Pap smear is a screening test designed to aid in  the detection of                           premalignant and malignant conditions of the uterine cervix.  It is not a                          diagnostic procedure and should not be used as the sole means of detecting                          cervical cancer.  Both false-positive and false-negative reports do occur.    No results found.   Assessment/Plan   ICD-9-CM ICD-10-CM   1. Right knee pain - probable arthritis 719.46 M25.561 DG Knee Complete 4 Views Right  2. Essential hypertension, benign - stable; diet controlled 401.1 I10 CMP     CBC with Differential  3. Depressed bipolar I disorder in partial remission (HCC) - stable 296.55 F31.75 Hemoglobin A1c  4. Encounter for therapeutic drug monitoring V58.83 Z51.81 CMP     Hemoglobin A1c     CBC with Differential  5. Hyperglycemia 790.29 R73.9 Hemoglobin A1c  6. Hyperlipidemia with target LDL less than 130 272.4 E78.5 Lipid Panel     TSH  7. Gastroesophageal reflux disease, esophagitis presence not specified - stable 530.81 K21.9     Flu shot given today  Continue current medications as ordered  Follow up with specialists as scheduled  Follow up in 6 mos for CPE  [facility] S. Perlie Gold  [facility] [address] [place], [place] [address] [phone] Cell (Monday-Friday 8 AM - 5 PM) [phone] After 5 PM and follow prompts

## 2015-09-13 LAB — COMPREHENSIVE METABOLIC PANEL WITH GFR
ALT: 20 [IU]/L (ref 0–32)
AST: 21 [IU]/L (ref 0–40)
Albumin/Globulin Ratio: 1.4 (ref 1.1–2.5)
Albumin: 4 g/dL (ref 3.6–4.8)
Alkaline Phosphatase: 85 [IU]/L (ref 39–117)
BUN/Creatinine Ratio: 16 (ref 11–26)
BUN: 14 mg/dL (ref 8–27)
Bilirubin Total: 0.3 mg/dL (ref 0.0–1.2)
CO2: 27 mmol/L (ref 18–29)
Calcium: 9.6 mg/dL (ref 8.7–10.3)
Chloride: 99 mmol/L (ref 97–106)
Creatinine, Ser: 0.9 mg/dL (ref 0.57–1.00)
GFR calc Af Amer: 76 mL/min/{1.73_m2}
GFR calc non Af Amer: 66 mL/min/{1.73_m2}
Globulin, Total: 2.8 g/dL (ref 1.5–4.5)
Glucose: 94 mg/dL (ref 65–99)
Potassium: 4 mmol/L (ref 3.5–5.2)
Sodium: 140 mmol/L (ref 136–144)
Total Protein: 6.8 g/dL (ref 6.0–8.5)

## 2015-09-13 LAB — CBC WITH DIFFERENTIAL/PLATELET
BASOS ABS: 0 10*3/uL (ref 0.0–0.2)
BASOS: 0 %
EOS (ABSOLUTE): 0.2 10*3/uL (ref 0.0–0.4)
EOS: 4 %
HEMOGLOBIN: 13.1 g/dL (ref 11.1–15.9)
Hematocrit: 39.7 % (ref 34.0–46.6)
IMMATURE GRANS (ABS): 0 10*3/uL (ref 0.0–0.1)
IMMATURE GRANULOCYTES: 0 %
LYMPHS ABS: 1.8 10*3/uL (ref 0.7–3.1)
Lymphs: 40 %
MCH: 29.2 pg (ref 26.6–33.0)
MCHC: 33 g/dL (ref 31.5–35.7)
MCV: 88 fL (ref 79–97)
MONOCYTES: 11 %
Monocytes Absolute: 0.5 10*3/uL (ref 0.1–0.9)
Neutrophils Absolute: 2 10*3/uL (ref 1.4–7.0)
Neutrophils: 45 %
Platelets: 286 10*3/uL (ref 150–379)
RBC: 4.49 x10E6/uL (ref 3.77–5.28)
RDW: 15.6 % — ABNORMAL HIGH (ref 12.3–15.4)
WBC: 4.5 10*3/uL (ref 3.4–10.8)

## 2015-09-13 LAB — LIPID PANEL
CHOL/HDL RATIO: 3.9 ratio (ref 0.0–4.4)
CHOLESTEROL TOTAL: 216 mg/dL — AB (ref 100–199)
HDL: 56 mg/dL (ref >39)
LDL CALC: 139 mg/dL — AB (ref 0–99)
TRIGLYCERIDES: 107 mg/dL (ref 0–149)
VLDL Cholesterol Cal: 21 mg/dL (ref 5–40)

## 2015-09-13 LAB — HEMOGLOBIN A1C
Est. average glucose Bld gHb Est-mCnc: 128 mg/dL
Hgb A1c MFr Bld: 6.1 % — ABNORMAL HIGH (ref 4.8–5.6)

## 2015-09-13 LAB — TSH: TSH: 1.06 u[IU]/mL (ref 0.450–4.500)

## 2016-07-13 DIAGNOSIS — R69 Illness, unspecified: Secondary | ICD-10-CM | POA: Diagnosis not present

## 2016-07-19 ENCOUNTER — Telehealth: Payer: Self-pay

## 2016-07-19 NOTE — Telephone Encounter (Signed)
Per my last note, she needs to f/u with mental health provider, Johna Roles, [facility]

## 2016-07-19 NOTE — Telephone Encounter (Signed)
Patient's sister called with several concerns  1.) When is patient due to follow-up?  Patient was suppose to be seen in June for a CPX. Harriett states patient needs to be seen for follow-up on ongoing concerns this week. Dr.Carter has no available appointment's. Appointment scheduled for tomorrow with Janett Billow   2.) Patient is still having leg concerns, patient never went to get xray because she forgot and someone has to assist her with transportation. Patient's sister was informed xray order is valid through 11/2016. Patient can walk-in at [facility]. Harriett confirmed that she is familiar with [place] locations   3.) Patient was told to follow-up with specialist at last appointment in December 2016, patient's sister would like to know what specialist was Dr.Carter referring to  Dr.Carter please advise on question #3

## 2016-07-20 ENCOUNTER — Encounter: Payer: Self-pay | Admitting: Nurse Practitioner

## 2016-07-20 ENCOUNTER — Ambulatory Visit (INDEPENDENT_AMBULATORY_CARE_PROVIDER_SITE_OTHER): Payer: Medicare HMO | Admitting: Nurse Practitioner

## 2016-07-20 VITALS — BP 122/84 | HR 74 | Temp 98.3°F | Resp 18 | Ht 66.0 in | Wt 243.8 lb

## 2016-07-20 DIAGNOSIS — R69 Illness, unspecified: Secondary | ICD-10-CM | POA: Diagnosis not present

## 2016-07-20 DIAGNOSIS — E785 Hyperlipidemia, unspecified: Secondary | ICD-10-CM

## 2016-07-20 DIAGNOSIS — G8929 Other chronic pain: Secondary | ICD-10-CM

## 2016-07-20 DIAGNOSIS — F3175 Bipolar disorder, in partial remission, most recent episode depressed: Secondary | ICD-10-CM | POA: Diagnosis not present

## 2016-07-20 DIAGNOSIS — R0602 Shortness of breath: Secondary | ICD-10-CM | POA: Diagnosis not present

## 2016-07-20 DIAGNOSIS — Z23 Encounter for immunization: Secondary | ICD-10-CM

## 2016-07-20 DIAGNOSIS — R739 Hyperglycemia, unspecified: Secondary | ICD-10-CM

## 2016-07-20 DIAGNOSIS — M25561 Pain in right knee: Secondary | ICD-10-CM

## 2016-07-20 LAB — CBC WITH DIFFERENTIAL/PLATELET
BASOS ABS: 0 {cells}/uL (ref 0–200)
Basophils Relative: 0 %
EOS PCT: 2 %
Eosinophils Absolute: 118 cells/uL (ref 15–500)
HCT: 40 % (ref 35.0–45.0)
Hemoglobin: 12.8 g/dL (ref 11.7–15.5)
Lymphocytes Relative: 33 %
Lymphs Abs: 1947 cells/uL (ref 850–3900)
MCH: 29 pg (ref 27.0–33.0)
MCHC: 32 g/dL (ref 32.0–36.0)
MCV: 90.5 fL (ref 80.0–100.0)
MONOS PCT: 9 %
MPV: 9.7 fL (ref 7.5–12.5)
Monocytes Absolute: 531 cells/uL (ref 200–950)
NEUTROS ABS: 3304 {cells}/uL (ref 1500–7800)
Neutrophils Relative %: 56 %
PLATELETS: 293 10*3/uL (ref 140–400)
RBC: 4.42 MIL/uL (ref 3.80–5.10)
RDW: 14.7 % (ref 11.0–15.0)
WBC: 5.9 10*3/uL (ref 3.8–10.8)

## 2016-07-20 LAB — COMPLETE METABOLIC PANEL WITH GFR
ALT: 17 U/L (ref 6–29)
AST: 19 U/L (ref 10–35)
Albumin: 3.8 g/dL (ref 3.6–5.1)
Alkaline Phosphatase: 80 U/L (ref 33–130)
BUN: 15 mg/dL (ref 7–25)
CHLORIDE: 105 mmol/L (ref 98–110)
CO2: 25 mmol/L (ref 20–31)
CREATININE: 1.1 mg/dL — AB (ref 0.50–0.99)
Calcium: 9.5 mg/dL (ref 8.6–10.4)
GFR, Est African American: 59 mL/min — ABNORMAL LOW (ref >=60)
GFR, Est Non African American: 51 mL/min — ABNORMAL LOW (ref >=60)
GLUCOSE: 90 mg/dL (ref 65–99)
Potassium: 3.8 mmol/L (ref 3.5–5.3)
Sodium: 140 mmol/L (ref 135–146)
Total Bilirubin: 0.4 mg/dL (ref 0.2–1.2)
Total Protein: 6.8 g/dL (ref 6.1–8.1)

## 2016-07-20 LAB — LIPID PANEL
Cholesterol: 198 mg/dL (ref 125–200)
HDL: 61 mg/dL (ref >=46)
LDL CALC: 117 mg/dL (ref <130)
Total CHOL/HDL Ratio: 3.2 Ratio (ref <=5.0)
Triglycerides: 98 mg/dL (ref <150)
VLDL: 20 mg/dL (ref <30)

## 2016-07-20 NOTE — Progress Notes (Signed)
Careteam: Patient Care Team: [name], DO as PCP - General (Internal Medicine)  Advanced Directive information Does patient have an advance directive?: No  No Known Allergies  Chief Complaint  Patient presents with  . Acute Visit    10 month follow up   . Other    Wants flu vaccine today     HPI: Patient is a 70 y.o. female seen in the office today for routine follow up. Missed 6 month appt with Dr Eulas Post. Pt with hx of constipation, htn, GERd, depression with bipolar disorder followed by psych, hyperlipidemia and hyperglycemia.  Reports she is fasting today.  Been doing good and therefore did not feel like she needed to follow up.  Feels like she is out of breath easily, feels heavy on her chest. Keeps busy around the house. Shortness of breath does not inhibit her activities around the house. Can feel a worsening of shortness of breath with mopping. 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  No increase in anxiety Taking linzess as needed for constipation, not having issues at this time.  No pain.  Pt drives reports she lives alone.   Review of Systems:  Review of Systems  Constitutional: Negative for chills and fever.  HENT: Negative for congestion and tinnitus.   Respiratory: Positive for shortness of breath. Negative for cough.   Cardiovascular: Negative for chest pain, palpitations and leg swelling.  Gastrointestinal: Negative for abdominal pain, constipation and diarrhea.  Genitourinary: Negative for dysuria, frequency and urgency.  Musculoskeletal: Negative for back pain and myalgias.  Skin: Negative.   Allergic/Immunologic: Positive for environmental allergies.  Neurological: Negative for dizziness and headaches.    Past Medical History:  Diagnosis Date  . Allergic rhinitis   . Anemia   . Anxiety   . Arthritis   . Bipolar 1 disorder ([place])   . Depression   . Diverticulosis of colon  (without mention of hemorrhage) 08/25/2011   [name]  . 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 Hyperlipidemia   . Hypertension   . Obesity   . Vertigo    Past Surgical History:  Procedure Laterality Date  . COLONOSCOPY  08/25/2011   Procedure: COLONOSCOPY;  Surgeon: Landry Dyke, MD;  Location: [facility];  Service: Endoscopy;  Laterality: N/A;  . DILATION AND CURETTAGE OF UTERUS  1960's   Social History:   reports that she has never smoked. She has never used smokeless tobacco. She reports that she does not drink alcohol or use drugs.  Family History  Problem Relation Age of Onset  . Prostate cancer Brother   . Hypertension Brother   . Kidney disease Brother   . Colon cancer Brother   . Hypertension Mother   . Kidney disease Mother   . Stomach cancer Father   . Hyperlipidemia Sister   . Hypothyroidism Sister   . Stroke Brother   . Prostate cancer Brother     Medications: Patient's Medications  New Prescriptions   No medications on file  Previous Medications   FLUOXETINE (PROZAC) 40 MG CAPSULE    Take 40 mg by mouth daily.   LINACLOTIDE (LINZESS) 145 MCG CAPS CAPSULE    Take 1 capsule (145 mcg total) by mouth daily.   LURASIDONE (LATUDA) 80 MG TABS TABLET    Take 80 mg by mouth daily with breakfast.   QUETIAPINE (SEROQUEL) 300 MG TABLET  Take 2 tablets by mouth at bedtime.  Modified Medications   No medications on file  Discontinued Medications   PANTOPRAZOLE (PROTONIX) 40 MG TABLET    Take 1 tablet (40 mg total) by mouth daily.   RISPERIDONE (RISPERDAL) 0.5 MG TABLET    Take 0.5 mg by mouth at bedtime.   TDAP (ADACEL) 02-09-14.5 LF-MCG/0.5 INJECTION    Inject 0.5 mLs into the muscle once.     Physical Exam:  Vitals:   07/20/16 1346  BP: 122/84  Pulse: 74  Resp: 18  Temp: 98.3 F (36.8 C)  TempSrc: Oral  SpO2: 95%  Weight: 243 lb 12.8 oz (110.6 kg)  Height: '5\' 6"'$  (1.676 m)   Body mass index is 39.35 kg/m.  Physical Exam    Constitutional: She is oriented to person, place, and time. She appears well-developed and well-nourished.  HENT:  Mouth/Throat: Oropharynx is clear and moist. No oropharyngeal exudate.  Eyes: Pupils are equal, round, and reactive to light. No scleral icterus.  Neck: Neck supple. Carotid bruit is not present.  Cardiovascular: Normal rate, regular rhythm and normal heart sounds.   Pulmonary/Chest: Effort normal and breath sounds normal. No respiratory distress. She has no wheezes. She has no rales.  Abdominal: Soft. Bowel sounds are normal. She exhibits no distension. There is no hepatomegaly.  Musculoskeletal: She exhibits tenderness (to right knee). She exhibits no edema.  Neurological: She is alert and oriented to person, place, and time. She has normal reflexes.  Skin: Skin is warm and dry. No rash noted.  Psychiatric: She has a normal mood and affect. Her behavior is normal.    Labs reviewed: Basic Metabolic Panel:  Recent Labs  09/12/15 1455  NA 140  K 4.0  CL 99  CO2 27  GLUCOSE 94  BUN 14  CREATININE 0.90  CALCIUM 9.6  TSH 1.060   Liver Function Tests:  Recent Labs  09/12/15 1455  AST 21  ALT 20  ALKPHOS 85  BILITOT 0.3  PROT 6.8  ALBUMIN 4.0   No results for input(s): LIPASE, AMYLASE in the last 8760 hours. No results for input(s): AMMONIA in the last 8760 hours. CBC:  Recent Labs  09/12/15 1455  WBC 4.5  NEUTROABS 2.0  HCT 39.7  MCV 88  PLT 286   Lipid Panel:  Recent Labs  09/12/15 1455  CHOL 216*  HDL 56  LDLCALC 139*  TRIG 107  CHOLHDL 3.9   TSH:  Recent Labs  09/12/15 1455  TSH 1.060   A1C: Lab Results  Component Value Date   HGBA1C 6.1 (H) 09/12/2015     Assessment/Plan 1. Chronic pain of right knee -to get xray which has been ordered by Dr Eulas Post at previous visit. Cont OTC PRN medication.   2. Depressed bipolar I disorder in partial remission ([place]) Following with psych, cont current medication  3. Hyperglycemia -  Hemoglobin A1c  4. Hyperlipidemia with target LDL less than 130 -heart healthy diet with exercise encouraged - CMP with eGFR - Lipid panel  5. Shortness of breath With exertion - DG Chest 2 View - CBC with Differential/Platelets - EKG 12-Lead- showing NSR  6. Encounter for immunization - Flu Vaccine QUAD 36+ mos IM  To schedule follow up with Dr Eulas Post for [place]. Harle Battiest  [facility] [phone] 8 am - 5 pm) [phone] (after hours)

## 2016-07-20 NOTE — Patient Instructions (Addendum)
To go to [facility] to have xray done of chest and knee  To work on diet and increase exercise  Follow up in 3 months for physical with Dr Eulas Post  DASH Eating Plan DASH stands for "Dietary Approaches to Stop Hypertension." The DASH eating plan is a healthy eating plan that has been shown to reduce high blood pressure (hypertension). Additional health benefits may include reducing the risk of type 2 diabetes mellitus, heart disease, and stroke. The DASH eating plan may also help with weight loss. WHAT DO I NEED TO KNOW ABOUT THE DASH EATING PLAN? For the DASH eating plan, you will follow these general guidelines:  Choose foods with a percent daily value for sodium of less than 5% (as listed on the food label).  Use salt-free seasonings or herbs instead of table salt or sea salt.  Check with your health care provider or pharmacist before using salt substitutes.  Eat lower-sodium products, often labeled as "lower sodium" or "no salt added."  Eat fresh foods.  Eat more vegetables, fruits, and low-fat dairy products.  Choose whole grains. Look for the word "whole" as the first word in the ingredient list.  Choose fish and skinless chicken or [place] more often than red meat. Limit fish, poultry, and meat to 6 oz (170 g) each day.  Limit sweets, desserts, sugars, and sugary drinks.  Choose heart-healthy fats.  Limit cheese to 1 oz (28 g) per day.  Eat more home-cooked food and less restaurant, buffet, and fast food.  Limit fried foods.  Cook foods using methods other than frying.  Limit canned vegetables. If you do use them, rinse them well to decrease the sodium.  When eating at a restaurant, ask that your food be prepared with less salt, or no salt if possible. WHAT FOODS CAN I EAT? Seek help from a dietitian for individual calorie needs. Grains Whole grain or whole wheat bread. Brown rice. Whole grain or whole wheat pasta. Quinoa, bulgur, and whole grain cereals.  Low-sodium cereals. Corn or whole wheat flour tortillas. Whole grain cornbread. Whole grain crackers. Low-sodium crackers. Vegetables Fresh or frozen vegetables (raw, steamed, roasted, or grilled). Low-sodium or reduced-sodium tomato and vegetable juices. Low-sodium or reduced-sodium tomato sauce and paste. Low-sodium or reduced-sodium canned vegetables.  Fruits All fresh, canned (in natural juice), or frozen fruits. Meat and Other Protein Products Ground beef (85% or leaner), grass-fed beef, or beef trimmed of fat. Skinless chicken or [place]. Ground chicken or [place]. Pork trimmed of fat. All fish and seafood. Eggs. Dried beans, peas, or lentils. Unsalted nuts and seeds. Unsalted canned beans. Dairy Low-fat dairy products, such as skim or 1% milk, 2% or reduced-fat cheeses, low-fat ricotta or cottage cheese, or plain low-fat yogurt. Low-sodium or reduced-sodium cheeses. Fats and Oils Tub margarines without trans fats. Light or reduced-fat mayonnaise and salad dressings (reduced sodium). Avocado. Safflower, olive, or canola oils. Natural peanut or almond butter. Other Unsalted popcorn and pretzels. The items listed above may not be a complete list of recommended foods or beverages. Contact your dietitian for more options. WHAT FOODS ARE NOT RECOMMENDED? Grains White bread. White pasta. White rice. Refined cornbread. Bagels and croissants. Crackers that contain trans fat. Vegetables Creamed or fried vegetables. Vegetables in a cheese sauce. Regular canned vegetables. Regular canned tomato sauce and paste. Regular tomato and vegetable juices. Fruits Dried fruits. Canned fruit in light or heavy syrup. Fruit juice. Meat and Other Protein Products Fatty cuts of meat. Ribs, chicken wings, bacon, sausage, bologna,  salami, chitterlings, fatback, hot dogs, bratwurst, and packaged luncheon meats. Salted nuts and seeds. Canned beans with salt. Dairy Whole or 2% milk, cream, half-and-half, and cream  cheese. Whole-fat or sweetened yogurt. Full-fat cheeses or blue cheese. Nondairy creamers and whipped toppings. Processed cheese, cheese spreads, or cheese curds. Condiments Onion and garlic salt, seasoned salt, table salt, and sea salt. Canned and packaged gravies. Worcestershire sauce. Tartar sauce. Barbecue sauce. Teriyaki sauce. Soy sauce, including reduced sodium. Steak sauce. Fish sauce. Oyster sauce. Cocktail sauce. Horseradish. Ketchup and mustard. Meat flavorings and tenderizers. Bouillon cubes. Hot sauce. Tabasco sauce. Marinades. Taco seasonings. Relishes. Fats and Oils Butter, stick margarine, lard, shortening, ghee, and bacon fat. Coconut, palm kernel, or palm oils. Regular salad dressings. Other Pickles and olives. Salted popcorn and pretzels. The items listed above may not be a complete list of foods and beverages to avoid. Contact your dietitian for more information. WHERE CAN I FIND MORE INFORMATION? National Heart, Lung, and Blood Institute: travelstabloid.com   This information is not intended to replace advice given to you by your health care provider. Make sure you discuss any questions you have with your health care provider.   Document Released: 09/16/2011 Document Revised: 10/18/2014 Document Reviewed: 08/01/2013 Elsevier Interactive Patient Education [employer].

## 2016-07-21 ENCOUNTER — Other Ambulatory Visit: Payer: Self-pay | Admitting: Internal Medicine

## 2016-07-21 DIAGNOSIS — Z1231 Encounter for screening mammogram for malignant neoplasm of breast: Secondary | ICD-10-CM

## 2016-07-21 LAB — HEMOGLOBIN A1C
HEMOGLOBIN A1C: 5.6 % (ref <5.7)
Mean Plasma Glucose: 114 mg/dL

## 2016-07-22 NOTE — Telephone Encounter (Signed)
Spoke with patient sister and patient is following up with Mental Health provider

## 2016-07-22 NOTE — Telephone Encounter (Signed)
Patient was seen on 07-20-16, I am not sure if she was advised on Dr.Carter's response.  Left message on voicemail for patient to return call when available

## 2016-07-23 ENCOUNTER — Ambulatory Visit
Admission: RE | Admit: 2016-07-23 | Discharge: 2016-07-23 | Disposition: A | Discharge status: Home / Self Care | Payer: Medicare HMO | Source: Ambulatory Visit | Attending: Internal Medicine | Admitting: Internal Medicine

## 2016-07-23 DIAGNOSIS — M179 Osteoarthritis of knee, unspecified: Secondary | ICD-10-CM | POA: Diagnosis not present

## 2016-07-23 DIAGNOSIS — M25561 Pain in right knee: Secondary | ICD-10-CM

## 2016-07-23 DIAGNOSIS — R0602 Shortness of breath: Secondary | ICD-10-CM | POA: Diagnosis not present

## 2016-07-24 DIAGNOSIS — H524 Presbyopia: Secondary | ICD-10-CM | POA: Diagnosis not present

## 2016-07-24 DIAGNOSIS — H43393 Other vitreous opacities, bilateral: Secondary | ICD-10-CM | POA: Diagnosis not present

## 2016-07-26 ENCOUNTER — Other Ambulatory Visit: Payer: Self-pay | Admitting: Nurse Practitioner

## 2016-07-26 ENCOUNTER — Telehealth: Payer: Self-pay

## 2016-07-26 DIAGNOSIS — R0602 Shortness of breath: Secondary | ICD-10-CM

## 2016-07-26 NOTE — Telephone Encounter (Signed)
Patient was notified of Jessica's instructions. She verbalized understanding and stated that she would try the OTC meclizine 25 mg. She stated that she would call back if that medication did not work .

## 2016-07-26 NOTE — Telephone Encounter (Signed)
Patient called and stated that someone had called her from the office this morning, however, no one had called her. I discussed the lab results from last week with her just in case someone had called last week.   While on the phone, patient stated that she has been having bad bouts of vertigo for 3 days. Patient would like to know if a medication for vertigo can be called into her pharmacy or does she need to schedule an appointment.   Please advise.

## 2016-07-26 NOTE — Telephone Encounter (Signed)
May use OTC meclizine 25 mg 1/2-1 tablet every 8 hours as needed for dizziness, if this is not effective needs to make Office visit

## 2016-07-27 ENCOUNTER — Other Ambulatory Visit: Payer: Self-pay | Admitting: *Deleted

## 2016-07-27 MED ORDER — MECLIZINE HCL 25 MG PO TABS: ORAL_TABLET | ORAL | 0 refills | Status: DC

## 2016-07-27 NOTE — Telephone Encounter (Signed)
Patient called and stated that the pharmacy needed the medication sent in to them because it was not OTC. Jessica ordered yesterday.  Rx faxed and patient aware that if it doesn't help she needs to schedule an appointment. Agreed.

## 2016-08-02 ENCOUNTER — Ambulatory Visit
Admission: RE | Admit: 2016-08-02 | Discharge: 2016-08-02 | Disposition: A | Discharge status: Home / Self Care | Payer: Medicare HMO | Source: Ambulatory Visit | Attending: Internal Medicine | Admitting: Internal Medicine

## 2016-08-02 DIAGNOSIS — Z1231 Encounter for screening mammogram for malignant neoplasm of breast: Secondary | ICD-10-CM

## 2016-08-04 ENCOUNTER — Other Ambulatory Visit: Payer: Self-pay | Admitting: Internal Medicine

## 2016-08-04 DIAGNOSIS — R928 Other abnormal and inconclusive findings on diagnostic imaging of breast: Secondary | ICD-10-CM

## 2016-08-06 ENCOUNTER — Other Ambulatory Visit: Payer: Medicare HMO

## 2016-08-06 DIAGNOSIS — Z6834 Body mass index (BMI) 34.0-34.9, adult: Secondary | ICD-10-CM | POA: Diagnosis not present

## 2016-08-06 DIAGNOSIS — Z0000 Encounter for general adult medical examination without abnormal findings: Secondary | ICD-10-CM | POA: Diagnosis not present

## 2016-08-06 DIAGNOSIS — I10 Essential (primary) hypertension: Secondary | ICD-10-CM | POA: Diagnosis not present

## 2016-08-06 DIAGNOSIS — R69 Illness, unspecified: Secondary | ICD-10-CM | POA: Diagnosis not present

## 2016-08-08 NOTE — Progress Notes (Deleted)
Cardiology Office Note  NEW PATIENT VISIT   Date:  08/08/2016   ID:  Judy [name], DOB [birth year], MRN [id number]  PCP:  [name], DO  Cardiologist:  ***    No chief complaint on file.     History of Present Illness: Judy [name] is a 70 y.o. female who presents for SOB.   Past Medical History:  Diagnosis Date  . Allergic rhinitis   . Anemia   . Anxiety   . Arthritis   . Bipolar 1 disorder ([place])   . Depression   . Diverticulosis of colon (without mention of hemorrhage) 08/25/2011   [name]  . 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 Hyperlipidemia   . Hypertension   . Obesity   . Vertigo     Past Surgical History:  Procedure Laterality Date  . COLONOSCOPY  08/25/2011   Procedure: COLONOSCOPY;  Surgeon: Landry Dyke, MD;  Location: [facility];  Service: Endoscopy;  Laterality: N/A;  . DILATION AND CURETTAGE OF UTERUS  1960's     Current Outpatient Prescriptions  Medication Sig Dispense Refill  . FLUoxetine (PROZAC) 40 MG capsule Take 40 mg by mouth daily.    . Linaclotide (LINZESS) 145 MCG CAPS capsule Take 1 capsule (145 mcg total) by mouth daily. 30 capsule 3  . lurasidone (LATUDA) 80 MG TABS tablet Take 80 mg by mouth daily with breakfast.    . meclizine (ANTIVERT) 25 MG tablet Take 1/2 to 1 tablet by mouth every 8 hours as needed for dizziness 30 tablet 0  . QUEtiapine (SEROQUEL) 300 MG tablet Take 2 tablets by mouth at bedtime.     No current facility-administered medications for this visit.     Allergies:   Review of patient's allergies indicates no known allergies.    Social History:  The patient  reports that she has never smoked. She has never used smokeless tobacco. She reports that she does not drink alcohol or use drugs.   Family History:  The patient's family history includes Colon cancer in her brother; Hyperlipidemia in her sister; Hypertension in her brother and mother; Hypothyroidism in her sister; Kidney disease  in her brother and mother; Prostate cancer in her brother and brother; Stomach cancer in her father; Stroke in her brother.    ROS:  General:no colds or fevers, no weight changes Skin:no rashes or ulcers HEENT:no blurred vision, no congestion CV:see HPI PUL:see HPI GI:no diarrhea constipation or melena, no indigestion GU:no hematuria, no dysuria MS:no joint pain, no claudication Neuro:no syncope, no lightheadedness Endo:no diabetes, no thyroid disease Wt Readings from Last 3 Encounters:  07/20/16 243 lb 12.8 oz (110.6 kg)  09/12/15 249 lb (112.9 kg)  06/04/14 216 lb 6.4 oz (98.2 kg)     PHYSICAL EXAM: VS:  There were no vitals taken for this visit. , BMI There is no height or weight on file to calculate BMI. General:Pleasant affect, NAD Skin:Warm and dry, brisk capillary refill HEENT:normocephalic, sclera clear, mucus membranes moist Neck:supple, no JVD, no bruits  Heart:S1S2 RRR without murmur, gallup, rub or click Lungs:clear without rales, rhonchi, or wheezes OBS:JGGE, non tender, + BS, do not palpate liver spleen or masses Ext:no lower ext edema, 2+ pedal pulses, 2+ radial pulses Neuro:alert and oriented, MAE, follows commands, + facial symmetry    EKG:  EKG is ordered today. The ekg ordered today demonstrates ***   Recent Labs: 09/12/2015: TSH 1.060 07/20/2016: ALT 17; BUN 15; Creat 1.10;  Hemoglobin 12.8; Platelets 293; Potassium 3.8; Sodium 140    Lipid Panel    Component Value Date/Time   CHOL 198 07/20/2016 1434   CHOL 216 (H) 09/12/2015 1455   TRIG 98 07/20/2016 1434   HDL 61 07/20/2016 1434   HDL 56 09/12/2015 1455   CHOLHDL 3.2 07/20/2016 1434   VLDL 20 07/20/2016 1434   LDLCALC 117 07/20/2016 1434   LDLCALC 139 (H) 09/12/2015 1455       Other studies Reviewed: Additional studies/ records that were reviewed today include: ***.   ASSESSMENT AND PLAN:  1.  Dyspnea   Current medicines are reviewed with the patient today.  The patient Has no  concerns regarding medicines.  The following changes have been made:  See above Labs/ tests ordered today include:see above  Disposition:   FU:  see above  Signed, Cecilie Kicks, NP  08/08/2016 9:36 PM    [facility] [address], [place], [place], [place] Phone: [phone]; Fax: [phone]

## 2016-08-09 ENCOUNTER — Ambulatory Visit: Payer: Medicare HMO | Admitting: Cardiology

## 2016-08-13 ENCOUNTER — Encounter: Payer: Self-pay | Admitting: Cardiology

## 2016-08-20 ENCOUNTER — Ambulatory Visit
Admission: RE | Admit: 2016-08-20 | Discharge: 2016-08-20 | Disposition: A | Discharge status: Home / Self Care | Payer: Medicare HMO | Source: Ambulatory Visit | Attending: Internal Medicine | Admitting: Internal Medicine

## 2016-08-20 DIAGNOSIS — R928 Other abnormal and inconclusive findings on diagnostic imaging of breast: Secondary | ICD-10-CM

## 2016-08-20 DIAGNOSIS — N6001 Solitary cyst of right breast: Secondary | ICD-10-CM | POA: Diagnosis not present

## 2016-08-20 DIAGNOSIS — N6310 Unspecified lump in the right breast, unspecified quadrant: Secondary | ICD-10-CM | POA: Diagnosis not present

## 2016-08-31 ENCOUNTER — Encounter: Payer: Self-pay | Admitting: Cardiology

## 2016-09-01 ENCOUNTER — Encounter: Payer: Self-pay | Admitting: Cardiology

## 2016-09-13 ENCOUNTER — Ambulatory Visit (INDEPENDENT_AMBULATORY_CARE_PROVIDER_SITE_OTHER): Payer: Medicare HMO | Admitting: Cardiology

## 2016-09-13 ENCOUNTER — Encounter (INDEPENDENT_AMBULATORY_CARE_PROVIDER_SITE_OTHER): Payer: Self-pay

## 2016-09-13 ENCOUNTER — Encounter: Payer: Self-pay | Admitting: Cardiology

## 2016-09-13 VITALS — BP 140/70 | HR 63 | Ht 66.0 in | Wt 245.0 lb

## 2016-09-13 DIAGNOSIS — R55 Syncope and collapse: Secondary | ICD-10-CM | POA: Diagnosis not present

## 2016-09-13 DIAGNOSIS — R0602 Shortness of breath: Secondary | ICD-10-CM | POA: Diagnosis not present

## 2016-09-13 NOTE — Patient Instructions (Signed)
Medication Instructions: Your physician recommends that you continue on your current medications as directed. Please refer to the Current Medication list given to you today.   Testing/Procedures: Your physician has requested that you have an echocardiogram. Echocardiography is a painless test that uses sound waves to create images of your heart. It provides your doctor with information about the size and shape of your heart and how well your heart's chambers and valves are working. This procedure takes approximately one hour. There are no restrictions for this procedure.  Your physician has requested that you have a lexiscan myoview. For further information please visit [email or web address]. Please follow instruction sheet, as given.   Follow-Up: Your physician recommends that you schedule a follow-up appointment in: with Cecilie Kicks, NP after testing.   Any Other Special Instructions will be listed below:  Pharmacologic Stress Electrocardiogram, Care After Refer to this sheet in the next few weeks. These instructions provide you with information on caring for yourself after your procedure. Your health care provider may also give you more specific instructions. Your treatment has been planned according to current medical practices, but problems sometimes occur. Call your health care provider if you have any problems or questions after your procedure. WHAT TO EXPECT AFTER THE PROCEDURE After your procedure, you may have a brief period of:  Shortness of breath.  Dizziness.  Nausea.  Chest pain.  Headache. HOME CARE INSTRUCTIONS  You may return to your normal schedule, including diet, activities, and medicines, unless your health care provider tells you otherwise.   Keep your follow-up appointments as instructed.  You will get rid of the nuclear isotope through your urine within 24-36 hours after your test. Although your exposure to radiation during this test is small, it is  recommended that you avoid close contact with young children (such as hugging and cuddling) for 12-18 hours after the test. If you are breastfeeding, do not breastfeed for 24 hours after the test.  If you plan to travel by airplane within 3 days of the test, ask your health care provider for a form to make airport staff aware of the test you had. Airport Psychologist, forensic can sometimes pick up on the isotope used during this test. SEEK MEDICAL CARE IF:   You have signs of a reaction to the medicine used during the test. This may include red, swollen, or itchy skin.  You have persistent or worsening dizziness or light-headedness.  You have a fast or irregular heartbeat.  You have persistent nausea or vomiting. SEEK IMMEDIATE MEDICAL CARE IF:  You develop pain or pressure in the following areas:  Chest.  Jaw or neck.  Between your shoulder blades.  Radiating down your left arm.  You faint.  You have difficulty breathing. This information is not intended to replace advice given to you by your health care provider. Make sure you discuss any questions you have with your health care provider. Document Released: 07/18/2013 Document Revised: 10/02/2013 Document Reviewed: 07/18/2013 Elsevier Interactive Patient Education  2017 [employer].  If you need a refill on your cardiac medications before your next appointment, please call your pharmacy.

## 2016-09-13 NOTE — Progress Notes (Signed)
Cardiology Office Note  NEW PATIENT VISIT Date:  09/13/2016   ID:  Judy [name], DOB Oct 02, 1946, MRN [id number]  PCP:  [name], DO  Cardiologist:  New  [name]  Chief Complaint  Patient presents with  . Chest Pain    SOB and chest heaviness      History of Present Illness: Judy [name] is a 70 y.o. female who presents for SOB and chest heaviness.  She has hx of borderline HTN, and metabolic syndrome though most recent labs show normal hgb A1C and controlled lipids.  Her wt has increased over last 2 years.  She walks twice a week for exercise.  The SOB and chest heaviness does not awaken from sleep.     Past Medical History:  Diagnosis Date  . Allergic rhinitis   . Anemia   . Anxiety   . Arthritis   . Bipolar 1 disorder ([place])   . Depression   . Diverticulosis of colon (without mention of hemorrhage) 08/25/2011   [name]  . 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 Hyperlipidemia   . Hypertension   . Obesity   . Vertigo     Past Surgical History:  Procedure Laterality Date  . COLONOSCOPY  08/25/2011   Procedure: COLONOSCOPY;  Surgeon: Landry Dyke, MD;  Location: [facility];  Service: Endoscopy;  Laterality: N/A;  . DILATION AND CURETTAGE OF UTERUS  1960's     Current Outpatient Prescriptions  Medication Sig Dispense Refill  . FLUoxetine HCl 60 MG TABS Take 60 mg by mouth every morning.    [name] LATUDA 120 MG TABS Take 120 mg by mouth at bedtime. With 350 calories    . Linaclotide (LINZESS) 145 MCG CAPS capsule Take 1 capsule (145 mcg total) by mouth daily. 30 capsule 3  . meclizine (ANTIVERT) 25 MG tablet Take 1/2 to 1 tablet by mouth every 8 hours as needed for dizziness 30 tablet 0  . QUEtiapine (SEROQUEL) 300 MG tablet Take 2 tablets by mouth at bedtime.     No current facility-administered medications for this visit.     Allergies:   Patient has no known allergies.    Social History:  The patient  reports that she has never  smoked. She has never used smokeless tobacco. She reports that she does not drink alcohol or use drugs.   Family History:  The patient's family history includes Colon cancer in her brother; Hyperlipidemia in her sister; Hypertension in her brother and mother; Hypothyroidism in her sister; Kidney disease in her brother and mother; Prostate cancer in her brother and brother; Stomach cancer in her father; Stroke in her brother.    ROS:  General:no colds or fevers, no weight changes Skin:no rashes or ulcers HEENT:no blurred vision, no congestion CV:see HPI PUL:see HPI GI:no diarrhea constipation or melena, no indigestion GU:no hematuria, no dysuria MS:no joint pain, no claudication Neuro:no syncope, no lightheadedness Endo:no diabetes, no thyroid disease  Wt Readings from Last 3 Encounters:  09/13/16 245 lb (111.1 kg)  07/20/16 243 lb 12.8 oz (110.6 kg)  09/12/15 249 lb (112.9 kg)     PHYSICAL EXAM: VS:  BP 140/70   Pulse 63   Ht 5\' 6"  (1.676 m)   Wt 245 lb (111.1 kg)   SpO2 96%   BMI 39.54 kg/m  , BMI Body mass index is 39.54 kg/m. General:Pleasant affect, NAD Skin:Warm and dry, brisk capillary refill HEENT:normocephalic, sclera clear, mucus membranes moist  Neck:supple, no JVD, no bruits  Heart:S1S2 RRR without murmur, gallup, rub or click Lungs:clear without rales, rhonchi, or wheezes DPO:EUMPN, soft, non tender, + BS, do not palpate liver spleen or masses Ext:no lower ext edema, 2+ pedal pulses, 2+ radial pulses Neuro:alert and oriented X 3, MAE, follows commands, + facial symmetry    EKG:  EKG is NOT ordered today. The ekg done 07/20/16 was reviewed, WNL  Recent Labs: 07/20/2016: ALT 17; BUN 15; Creat 1.10; Hemoglobin 12.8; Platelets 293; Potassium 3.8; Sodium 140    Lipid Panel    Component Value Date/Time   CHOL 198 07/20/2016 1434   CHOL 216 (H) 09/12/2015 1455   TRIG 98 07/20/2016 1434   HDL 61 07/20/2016 1434   HDL 56 09/12/2015 1455   CHOLHDL 3.2  07/20/2016 1434   VLDL 20 07/20/2016 1434   LDLCALC 117 07/20/2016 1434   LDLCALC 139 (H) 09/12/2015 1455       Other studies Reviewed: Additional studies/ records that were reviewed today include: previous echo 2015 ------------------------------------------------------------ Study Conclusions  - Left ventricle: The cavity size was normal. Systolic function was normal. The estimated ejection fraction was in the range of 55% to 60%. Wall motion was normal; there were no regional wall motion abnormalities. There was an increased relative contribution of atrial contraction to ventricular filling. Features are consistent with a pseudonormal left ventricular filling pattern, with concomitant abnormal relaxation and increased filling pressure (grade 2 diastolic dysfunction). - Left atrium: The atrium was mildly dilated.  ASSESSMENT AND PLAN:  1. Exertional chest pressure and SOB.  Hx of G2DD on Echo.  Normal EF.  Plan for echo and lexiscan myoview.  I reviewed pt and EKG with [name] DOD.She will follow up with me post testing.   2. HTN mostly controlled.    3. Hyperlipidemia, well controlled on last labs.       Current medicines are reviewed with the patient today.  The patient Has no concerns regarding medicines.  The following changes have been made:  See above Labs/ tests ordered today include:see above  Disposition:   FU:  see above  Signed, Cecilie Kicks, NP  09/13/2016 3:59 PM    [facility] [place], [place], [place] Phone: [phone]; Fax: [phone]

## 2016-10-05 ENCOUNTER — Encounter: Payer: Self-pay | Admitting: *Deleted

## 2016-10-13 ENCOUNTER — Telehealth (HOSPITAL_COMMUNITY): Payer: Self-pay | Admitting: *Deleted

## 2016-10-13 NOTE — Telephone Encounter (Signed)
Patient given detailed instructions per Myocardial Perfusion Study Information Sheet for the test on 10/18/16 at 1230. Patient notified to arrive 15 minutes early and that it is imperative to arrive on time for appointment to keep from having the test rescheduled.  If you need to cancel or reschedule your appointment, please call the office within 24 hours of your appointment. Failure to do so may result in a cancellation of your appointment, and a $50 no show fee. Patient verbalized understanding.Lewayne Pauley, [place]e [place]

## 2016-10-18 ENCOUNTER — Ambulatory Visit (HOSPITAL_COMMUNITY): Payer: Medicare HMO | Attending: Cardiology

## 2016-10-18 ENCOUNTER — Ambulatory Visit (HOSPITAL_BASED_OUTPATIENT_CLINIC_OR_DEPARTMENT_OTHER): Payer: Medicare HMO

## 2016-10-18 ENCOUNTER — Other Ambulatory Visit: Payer: Self-pay

## 2016-10-18 DIAGNOSIS — R55 Syncope and collapse: Secondary | ICD-10-CM | POA: Diagnosis not present

## 2016-10-18 DIAGNOSIS — E785 Hyperlipidemia, unspecified: Secondary | ICD-10-CM | POA: Insufficient documentation

## 2016-10-18 DIAGNOSIS — I10 Essential (primary) hypertension: Secondary | ICD-10-CM | POA: Diagnosis not present

## 2016-10-18 DIAGNOSIS — R0602 Shortness of breath: Secondary | ICD-10-CM

## 2016-10-18 DIAGNOSIS — R079 Chest pain, unspecified: Secondary | ICD-10-CM | POA: Diagnosis not present

## 2016-10-18 DIAGNOSIS — Z6839 Body mass index (BMI) 39.0-39.9, adult: Secondary | ICD-10-CM | POA: Insufficient documentation

## 2016-10-18 DIAGNOSIS — E669 Obesity, unspecified: Secondary | ICD-10-CM | POA: Diagnosis not present

## 2016-10-18 MED ORDER — TECHNETIUM TC 99M TETROFOSMIN IV KIT
30.5000 | PACK | Freq: Once | INTRAVENOUS | Status: AC | PRN
Start: 2016-10-18 — End: 2016-10-18
  Administered 2016-10-18: 30.5 via INTRAVENOUS
  Filled 2016-10-18: qty 31

## 2016-10-18 MED ORDER — REGADENOSON 0.4 MG/5ML IV SOLN
0.4000 mg | Freq: Once | INTRAVENOUS | Status: AC
  Administered 2016-10-18: 0.4 mg via INTRAVENOUS

## 2016-10-19 ENCOUNTER — Ambulatory Visit (HOSPITAL_COMMUNITY): Payer: Medicare HMO | Attending: Internal Medicine

## 2016-10-19 LAB — MYOCARDIAL PERFUSION IMAGING
CHL CUP NUCLEAR SDS: 7
CHL CUP NUCLEAR SSS: 11
CHL CUP RESTING HR STRESS: 62 {beats}/min
LHR: 0.35
LV dias vol: 89 mL (ref 46–106)
LV sys vol: 36 mL
NUC STRESS TID: 0.94
Peak HR: 92 {beats}/min
SRS: 4

## 2016-10-19 MED ORDER — TECHNETIUM TC 99M TETROFOSMIN IV KIT
32.4000 | PACK | Freq: Once | INTRAVENOUS | Status: AC | PRN
  Administered 2016-10-19: 32.4 via INTRAVENOUS
  Filled 2016-10-19: qty 33

## 2016-10-22 ENCOUNTER — Encounter: Payer: Self-pay | Admitting: Cardiology

## 2016-10-22 ENCOUNTER — Ambulatory Visit (INDEPENDENT_AMBULATORY_CARE_PROVIDER_SITE_OTHER): Payer: Medicare HMO | Admitting: Cardiology

## 2016-10-22 VITALS — BP 130/80 | HR 60 | Ht 66.0 in | Wt 244.0 lb

## 2016-10-22 DIAGNOSIS — I10 Essential (primary) hypertension: Secondary | ICD-10-CM | POA: Diagnosis not present

## 2016-10-22 DIAGNOSIS — R0789 Other chest pain: Secondary | ICD-10-CM

## 2016-10-22 DIAGNOSIS — R0602 Shortness of breath: Secondary | ICD-10-CM

## 2016-10-22 DIAGNOSIS — R9439 Abnormal result of other cardiovascular function study: Secondary | ICD-10-CM | POA: Diagnosis not present

## 2016-10-22 NOTE — Progress Notes (Signed)
Cardiology Office Note   Date:  10/22/2016   ID:  Judy [name], DOB 01-Aug-1946, MRN [id number]  PCP:  [name], DO  Cardiologist:  [name]     Chief Complaint  Patient presents with  . Shortness of Breath    post test      History of Present Illness: Judy [name] is a 71 y.o. female who presents for her SOB and chest heaviness after testing.  She has hx of borderline HTN, and metabolic syndrome though most recent labs show normal hgb A1C and controlled lipids.  Her wt has increased over last 2 years.  She walks twice a week for exercise.  The SOB and chest heaviness does not awaken from sleep.    She had an echo with EF 55-60% and G1DD, LA mildly dilated, valves stable.    Her nuc study There is a large defect of moderate severity present in the basal inferolateral, mid inferolateral, mid anterior, apical anterior, mid inferoseptal, apical septal, apical inferior, apical lateral and apical locations. The defect is partially reversible. But normal LV function-possible breast attenuation.  Discussed with [name] and to further eval will do cardiac CT morphology and cardiac score.  Pt still with symptoms with walking upstairs and after meals.  She is agreeable to proceed with Cardiac CT.    Past Medical History:  Diagnosis Date  . Allergic rhinitis   . Anemia   . Anxiety   . Arthritis   . Bipolar 1 disorder ([place])   . Depression   . Diverticulosis of colon (without mention of hemorrhage) 08/25/2011   [name]  . 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 Hyperlipidemia   . Hypertension   . Obesity   . Vertigo     Past Surgical History:  Procedure Laterality Date  . COLONOSCOPY  08/25/2011   Procedure: COLONOSCOPY;  Surgeon: Landry Dyke, MD;  Location: [facility];  Service: Endoscopy;  Laterality: N/A;  . DILATION AND CURETTAGE OF UTERUS  1960's     Current Outpatient Prescriptions  Medication Sig Dispense Refill  . FLUoxetine HCl 60  MG TABS Take 60 mg by mouth every morning.    [name] LATUDA 120 MG TABS Take 120 mg by mouth at bedtime. With 350 calories    . Linaclotide (LINZESS) 145 MCG CAPS capsule Take 1 capsule (145 mcg total) by mouth daily. 30 capsule 3  . meclizine (ANTIVERT) 25 MG tablet Take 1/2 to 1 tablet by mouth every 8 hours as needed for dizziness 30 tablet 0  . QUEtiapine (SEROQUEL) 300 MG tablet Take 2 tablets by mouth at bedtime.     No current facility-administered medications for this visit.     Allergies:   Patient has no known allergies.    Social History:  The patient  reports that she has never smoked. She has never used smokeless tobacco. She reports that she does not drink alcohol or use drugs.   Family History:  The patient's family history includes Colon cancer in her brother; Hyperlipidemia in her sister; Hypertension in her brother and mother; Hypothyroidism in her sister; Kidney disease in her brother and mother; Prostate cancer in her brother and brother; Stomach cancer in her father; Stroke in her brother.    ROS:  General:no colds or fevers, no weight changes CV:see HPI PUL:see HPI GI:no diarrhea constipation or melena, no indigestion GU:no hematuria, no dysuria MS:no joint pain, no claudication   Wt Readings from  Last 3 Encounters:  10/22/16 244 lb (110.7 kg)  10/18/16 245 lb (111.1 kg)  09/13/16 245 lb (111.1 kg)     PHYSICAL EXAM: VS:  BP 130/80 (BP Location: Right Arm, Patient Position: Sitting, Cuff Size: Large)   Pulse 60   Ht 5\' 6"  (1.676 m)   Wt 244 lb (110.7 kg)   BMI 39.38 kg/m  , BMI Body mass index is 39.38 kg/m. General:Pleasant affect, NAD Skin:Warm and dry, brisk capillary refill HEENT:normocephalic, sclera clear, mucus membranes moist Neck:supple, no JVD, no bruits  Heart:S1S2 RRR without murmur, gallup, rub or click Lungs:clear without rales, rhonchi, or wheezes SVX:BLTJ, non tender, + BS, do not palpate liver spleen or masses    EKG:  EKG is NOT  ordered today.   Recent Labs: 07/20/2016: ALT 17; BUN 15; Creat 1.10; Hemoglobin 12.8; Platelets 293; Potassium 3.8; Sodium 140    Lipid Panel    Component Value Date/Time   CHOL 198 07/20/2016 1434   CHOL 216 (H) 09/12/2015 1455   TRIG 98 07/20/2016 1434   HDL 61 07/20/2016 1434   HDL 56 09/12/2015 1455   CHOLHDL 3.2 07/20/2016 1434   VLDL 20 07/20/2016 1434   LDLCALC 117 07/20/2016 1434   LDLCALC 139 (H) 09/12/2015 1455       Other studies Reviewed: Additional studies/ records that were reviewed today include: . NUC Study Highlights    Nuclear stress EF: 60%.  There was no ST segment deviation noted during stress.  There is a large defect of moderate severity present in the basal inferolateral, mid inferolateral, mid anterior, apical anterior, mid inferoseptal, apical septal, apical inferior, apical lateral and apical locations. The defect is partially reversible. Given normal LVF this likely represents shifting breast tissue attenuation artifact but cannot rule out ischemia.  This is an intermediate risk study. The study is limited by morbid obesity (BMI 39.5).     ECHO: Study Conclusions  - Left ventricle: The cavity size was normal. Wall thickness was   normal. Systolic function was normal. The estimated ejection   fraction was in the range of 55% to 60%. Wall motion was normal;   there were no regional wall motion abnormalities. Doppler   parameters are consistent with abnormal left ventricular   relaxation (grade 1 diastolic dysfunction). - Aortic valve: There was no stenosis. - Mitral valve: There was no significant regurgitation. - Left atrium: The atrium was mildly dilated. - Right ventricle: The cavity size was normal. Systolic function   was normal. - Pulmonary arteries: No complete TR doppler jet so unable to   estimate PA systolic pressure. - Inferior vena cava: The vessel was normal in size. The   respirophasic diameter changes were in the normal  range (>= 50%),   consistent with normal central venous pressure.  Impressions:  - Normal LV size with EF 55-60%. Normal RV size and systolic   function. No significant valvular abnormalities.  ASSESSMENT AND PLAN:  1.  Exertional chest pressure and SOB, Echo stable normal EF and Lexiscan myoview was + for ischemia vs. Breast attenuation.  Will do cardiac CT to eval.  If abnormal she would need a cath , follow up with [name] in 3-4 months.   2. HTN controlled     1. Exertional chest pressure and SOB.  Hx of G2DD on Echo.  Normal EF.  Plan for echo and lexiscan myoview.  I reviewed pt and EKG with [name] DOD.She will follow up with me post testing.  2. HTN mostly controlled.    3. Hyperlipidemia, well controlled on last labs.      Current medicines are reviewed with the patient today.  The patient Has no concerns regarding medicines.  The following changes have been made:  See above Labs/ tests ordered today include:see above  Disposition:   FU:  see above  Signed, Cecilie Kicks, NP  10/22/2016 4:59 PM    [facility] [place], [place], [place] Phone: [phone]; Fax: [phone]

## 2016-10-22 NOTE — Patient Instructions (Addendum)
Medication Instructions:  Your physician recommends that you continue on your current medications as directed. Please refer to the Current Medication list given to you today.   Labwork: None ordered  Testing/Procedures: Non-Cardiac CT Angiography (CTA), is a special type of CT scan that uses a computer to produce multi-dimensional views of major blood vessels throughout the body. In CT angiography, a contrast material is injected through an IV to help visualize the blood vessels   Follow-Up: Your physician recommends that you schedule a follow-up appointment in: [place]   Any Other Special Instructions Will Be Listed Below (If Applicable).   Cardiac CT Angiogram A cardiac CT angiogram is a test to help your health care provider find out why you are having chest pains or other symptoms of heart disease. The test uses an advanced type of X-ray machine that scans your heart and the area around the heart and creates multiple pictures of it. Other names for the test are coronary CT angiography, coronary artery scanning, and CTA.  The test is painless and fairly quick. It is noninvasive. That means it does not involve any type of surgery or cuts (incisions). Instead, a fluid called contrast dye is injected into an IV tube in your arm. The contrast dye acts as a highlighter as it flows through the veins. With the CT scan, it lets your health care provider see:   If the coronary arteries in your heart are more narrow than they should be, or if they are blocked.  If there is fluid around the heart.  If the muscles and tissues of the heart look weak or show signs of disease.  If the lungs contain any blood clots. LET [facility] CARE PROVIDER KNOW ABOUT:  Any allergies you have.   All medicines you are taking, including vitamins, herbs, eye drops, creams, and over-the-counter medicines.  Previous problems you or members of your family have had with the use of  anesthetics.  Any blood disorders you have.  Previous surgeries you have had.  Medical conditions you have. RISKS AND COMPLICATIONS Generally, this is a safe procedure. However, as with any procedure, problems can occur. Possible problems include:   Allergic reaction to the contrast dye. This can range from mild to severe and may include:   Itching at the IV tube insertion site.   Redness at the IV tube insertion site.   Hives.   Nausea.   Difficulty breathing.   Kidney failure.   Problems from radiation exposure. This test involves the use of radiation. Radiation exposure can be dangerous to a pregnant patient and fetus. If you are pregnant, shields are used to protect your belly and pelvic area. More details are available from your health care provider. BEFORE THE PROCEDURE  The day before the test:    Stop drinking caffeinated beverages. These include energy drinks, tea, soda, coffee, and hot chocolate.  Stop taking medicines to treat erectile dysfunction. They can interfere with medicines you may be given during the procedure. Check with your health care provider if you should stop taking any other medicines. On the day of the test:   About 4 hours before the test, stop eating and drinking anything but water as advised by your health care provider.  Avoid wearing jewelry. You will have to undress from the waist up and wear a hospital gown. PROCEDURE  The hair on your chest may need to be shaved. This is done because small sticky patches called electrodes are  put on your chest. These transmit information that helps monitor your heart during the test.  You might be given heart medicine during the test. This is done to control your heart rate during the test so a good image is obtained.  An IV tube will be inserted in your arm.  You will be asked to lie on a table with your arms above your head.  The contrast dye will be injected into the IV tube. You might  feel warm or you may get a metallic taste in your mouth.  The table you are lying on will move into a large machine that will do the scanning.  You will be able to see, hear, and talk to the person running the machine while you are in it. Follow that person's directions. You may be asked to hold your breath for 2-3 seconds as pictures are taken.  The CT machine will move around you to take pictures. Do not move while it is scanning. This helps to get a good image of your heart.  When the best possible pictures have been taken, the machine will be turned off. The table will move out of the machine. The IV tube will then be removed. AFTER THE PROCEDURE  You will be allowed to get dressed and return to your normal activities.  Results will be interpreted by the health care provider and the results will be discussed with you. This information is not intended to replace advice given to you by your health care provider. Make sure you discuss any questions you have with your health care provider. Document Released: 09/09/2008 Document Revised: 10/18/2014 Document Reviewed: 06/13/2013 Elsevier Interactive Patient Education  2017 [employer].    If you need a refill on your cardiac medications before your next appointment, please call your pharmacy.

## 2016-10-29 ENCOUNTER — Encounter: Payer: Medicare HMO | Admitting: Internal Medicine

## 2016-10-29 ENCOUNTER — Ambulatory Visit: Payer: Medicare HMO

## 2016-11-01 DIAGNOSIS — R69 Illness, unspecified: Secondary | ICD-10-CM | POA: Diagnosis not present

## 2016-11-11 ENCOUNTER — Ambulatory Visit: Payer: Medicare HMO | Admitting: Cardiology

## 2016-11-11 ENCOUNTER — Telehealth: Payer: Self-pay | Admitting: Cardiology

## 2016-11-11 ENCOUNTER — Telehealth: Payer: Self-pay | Admitting: *Deleted

## 2016-11-11 NOTE — Progress Notes (Deleted)
Cardiology Office Note   Date:  11/11/2016   ID:  Judy [name], DOB 25-Feb-1946, MRN [id number]  PCP:  [name], DO  Cardiologist:  [name]    No chief complaint on file.     History of Present Illness: Judy [name] is a 71 y.o. female who presents for explanation of cardiac cath - insurance did not approve of cardiac CT.  They preferred cardiac cath.    She has complained of SOB and chest heaviness after testing.  She has hx of borderline HTN, and metabolic syndrome though most recent labs show normal hgb A1C and controlled lipids. Her wt has increased over last 2 years. She walks twice a week for exercise. The SOB and chest heaviness does not awaken from sleep.   She had an echo with EF 55-60% and G1DD, LA mildly dilated, valves stable.    Her nuc study There is a large defect of moderate severity present in the basal inferolateral, mid inferolateral, mid anterior, apical anterior, mid inferoseptal, apical septal, apical inferior, apical lateral and apical locations. The defect is partially reversible. But normal LV function-possible breast attenuation.  Discussed with [name] and to further eval will do cardiac CT morphology and cardiac score. This was denies payment by insurance so we will proceed to cardiac cath.    Today***   Past Medical History:  Diagnosis Date  . Allergic rhinitis   . Anemia   . Anxiety   . Arthritis   . Bipolar 1 disorder ([place])   . Depression   . Diverticulosis of colon (without mention of hemorrhage) 08/25/2011   [name]  . 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 Hyperlipidemia   . Hypertension   . Obesity   . Vertigo     Past Surgical History:  Procedure Laterality Date  . COLONOSCOPY  08/25/2011   Procedure: COLONOSCOPY;  Surgeon: Landry Dyke, MD;  Location: [facility];  Service: Endoscopy;  Laterality: N/A;  . DILATION AND CURETTAGE OF UTERUS  1960's     Current Outpatient Prescriptions    Medication Sig Dispense Refill  . FLUoxetine HCl 60 MG TABS Take 60 mg by mouth every morning.    [name] LATUDA 120 MG TABS Take 120 mg by mouth at bedtime. With 350 calories    . Linaclotide (LINZESS) 145 MCG CAPS capsule Take 1 capsule (145 mcg total) by mouth daily. 30 capsule 3  . meclizine (ANTIVERT) 25 MG tablet Take 1/2 to 1 tablet by mouth every 8 hours as needed for dizziness 30 tablet 0  . QUEtiapine (SEROQUEL) 300 MG tablet Take 2 tablets by mouth at bedtime.     No current facility-administered medications for this visit.     Allergies:   Patient has no known allergies.    Social History:  The patient  reports that she has never smoked. She has never used smokeless tobacco. She reports that she does not drink alcohol or use drugs.   Family History:  The patient's ***family history includes Colon cancer in her brother; Hyperlipidemia in her sister; Hypertension in her brother and mother; Hypothyroidism in her sister; Kidney disease in her brother and mother; Prostate cancer in her brother and brother; Stomach cancer in her father; Stroke in her brother.    ROS:  General:no colds or fevers, no weight changes Skin:no rashes or ulcers HEENT:no blurred vision, no congestion CV:see HPI PUL:see HPI GI:no diarrhea constipation or melena, no indigestion GU:no hematuria,  no dysuria MS:no joint pain, no claudication Neuro:no syncope, no lightheadedness Endo:no diabetes, no thyroid disease Wt Readings from Last 3 Encounters:  10/22/16 244 lb (110.7 kg)  10/18/16 245 lb (111.1 kg)  09/13/16 245 lb (111.1 kg)     PHYSICAL EXAM: VS:  There were no vitals taken for this visit. , BMI There is no height or weight on file to calculate BMI. General:Pleasant affect, NAD Skin:Warm and dry, brisk capillary refill HEENT:normocephalic, sclera clear, mucus membranes moist Neck:supple, no JVD, no bruits  Heart:S1S2 RRR without murmur, gallup, rub or click Lungs:clear without rales, rhonchi, or  wheezes WUJ:WJXB, non tender, + BS, do not palpate liver spleen or masses Ext:no lower ext edema, 2+ pedal pulses, 2+ radial pulses Neuro:alert and oriented, MAE, follows commands, + facial symmetry    EKG:  EKG is ordered today. The ekg ordered today demonstrates ***   Recent Labs: 07/20/2016: ALT 17; BUN 15; Creat 1.10; Hemoglobin 12.8; Platelets 293; Potassium 3.8; Sodium 140    Lipid Panel    Component Value Date/Time   CHOL 198 07/20/2016 1434   CHOL 216 (H) 09/12/2015 1455   TRIG 98 07/20/2016 1434   HDL 61 07/20/2016 1434   HDL 56 09/12/2015 1455   CHOLHDL 3.2 07/20/2016 1434   VLDL 20 07/20/2016 1434   LDLCALC 117 07/20/2016 1434   LDLCALC 139 (H) 09/12/2015 1455       Other studies Reviewed: Additional studies/ records that were reviewed today include: . NUC Study Highlights    Nuclear stress EF: 60%.  There was no ST segment deviation noted during stress.  There is a large defect of moderate severity present in the basal inferolateral, mid inferolateral, mid anterior, apical anterior, mid inferoseptal, apical septal, apical inferior, apical lateral and apical locations. The defect is partially reversible. Given normal LVF this likely represents shifting breast tissue attenuation artifact but cannot rule out ischemia.  This is an intermediate risk study. The study is limited by morbid obesity (BMI 39.5).    ECHO: Study Conclusions  - Left ventricle: The cavity size was normal. Wall thickness was normal. Systolic function was normal. The estimated ejection fraction was in the range of 55% to 60%. Wall motion was normal; there were no regional wall motion abnormalities. Doppler parameters are consistent with abnormal left ventricular relaxation (grade 1 diastolic dysfunction). - Aortic valve: There was no stenosis. - Mitral valve: There was no significant regurgitation. - Left atrium: The atrium was mildly dilated. - Right ventricle: The  cavity size was normal. Systolic function was normal. - Pulmonary arteries: No complete TR doppler jet so unable to estimate PA systolic pressure. - Inferior vena cava: The vessel was normal in size. The respirophasic diameter changes were in the normal range (>= 50%), consistent with normal central venous pressure.  Impressions:  - Normal LV size with EF 55-60%. Normal RV size and systolic function. No significant valvular abnormalities.    ASSESSMENT AND PLAN:  1.  Exertional chest pain and SOB, normal EF on Echo with G2 dd.  + NUC STUDY FOR ISCHEMIA vs breast attenuation.  WILL PROCEED WITH CARDIAC CATH.   The patient understands that risks included but are not limited to stroke (1 in 1000), death (1 in 51), kidney failure [usually temporary] (1 in 500), bleeding (1 in 200), allergic reaction [possibly serious] (1 in 200).     2.  HTN controlled.   Current medicines are reviewed with the patient today.  The patient Has no concerns regarding  medicines.  The following changes have been made:  See above Labs/ tests ordered today include:see above  Disposition:   FU:  see above  Signed, Cecilie Kicks, NP  11/11/2016 9:22 AM    [facility] [place], [address], [place], [place] Phone: [phone]; Fax: [phone]

## 2016-11-11 NOTE — Telephone Encounter (Signed)
Judy [name] cancelled her appt to discuss cardiac cath.  I called her and she denies any more pain or SOB.  I reviewed that nuc study was abnormal and the cat scan was denied by her insurance company but that the heart cath would tell us if she did have disease.  She stated she would call us when she was ready to proceed.  But for now she feels well.

## 2016-11-11 NOTE — Telephone Encounter (Signed)
S/w pt explained needed pt to come in due to scheduling a procedure.  Pt stated did not want to re-schedule at this time. Will send to Theodosia Quay, RN to re-assess.

## 2016-11-12 NOTE — Telephone Encounter (Signed)
Reviewed chart and Cecilie Kicks NP has also spoken with the pt by phone.  Documentation in chart.

## 2016-11-22 ENCOUNTER — Ambulatory Visit: Payer: Medicare HMO

## 2016-12-09 ENCOUNTER — Encounter (INDEPENDENT_AMBULATORY_CARE_PROVIDER_SITE_OTHER): Payer: Self-pay

## 2016-12-09 ENCOUNTER — Encounter: Payer: Self-pay | Admitting: Cardiovascular Disease

## 2016-12-09 ENCOUNTER — Ambulatory Visit (INDEPENDENT_AMBULATORY_CARE_PROVIDER_SITE_OTHER): Payer: Medicare HMO | Admitting: Cardiovascular Disease

## 2016-12-09 VITALS — BP 156/94 | HR 63 | Ht 68.0 in | Wt 241.8 lb

## 2016-12-09 DIAGNOSIS — I10 Essential (primary) hypertension: Secondary | ICD-10-CM | POA: Diagnosis not present

## 2016-12-09 NOTE — Patient Instructions (Signed)
Medication Instructions:  Your physician recommends that you continue on your current medications as directed. Please refer to the Current Medication list given to you today.  Labwork: No new orders.   Testing/Procedures: No new orders.   Follow-Up: Your physician recommends that you schedule a follow-up appointment as needed with  Dr Cooper.    Any Other Special Instructions Will Be Listed Below (If Applicable).     If you need a refill on your cardiac medications before your next appointment, please call your pharmacy.   

## 2016-12-09 NOTE — Progress Notes (Signed)
Cardiology Office Note Date:  12/09/2016   ID:  Judy [name], DOB 01-Apr-1946, MRN [id number]  PCP:  [name], DO  Cardiologist:  Sherren Mocha, MD    No chief complaint on file.    History of Present Illness: Judy [name] is a 71 y.o. female who presents for follow-up evaluation.   She was initially seen in December 2017 for evaluation of chest pain and shortness of breath. A nuclear scan was performed and showed multiple perfusion defects with the possibility of shifting breast attenuation noted. An echo showed normal LV function. A gated coronary CTA was recommended but not covered by her insurance. She presents today to discuss whether further investigation needed.   She complains of shortness of breath with activity. Symptoms are longstanding but worse with her weight gain. No orthopnea, PND, or chest pain. No exertional symptoms except for dyspnea.    Past Medical History:  Diagnosis Date  . Allergic rhinitis   . Anemia   . Anxiety   . Arthritis   . Bipolar 1 disorder ([place])   . Depression   . Diverticulosis of colon (without mention of hemorrhage) 08/25/2011   [name]  . 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 Hyperlipidemia   . Hypertension   . Obesity   . Vertigo     Past Surgical History:  Procedure Laterality Date  . COLONOSCOPY  08/25/2011   Procedure: COLONOSCOPY;  Surgeon: Landry Dyke, MD;  Location: [facility];  Service: Endoscopy;  Laterality: N/A;  . DILATION AND CURETTAGE OF UTERUS  1960's    Current Outpatient Prescriptions  Medication Sig Dispense Refill  . FLUoxetine HCl 60 MG TABS Take 60 mg by mouth every morning.    [name] LATUDA 120 MG TABS Take 120 mg by mouth at bedtime. With 350 calories    . linaclotide (LINZESS) 145 MCG CAPS capsule Take 145 mcg by mouth daily as needed (constipation).    . meclizine (ANTIVERT) 25 MG tablet Take 1/2 to 1 tablet by mouth every 8 hours as needed for dizziness 30 tablet 0  . QUEtiapine  (SEROQUEL) 300 MG tablet Take 300 mg by mouth at bedtime.     No current facility-administered medications for this visit.     Allergies:   Patient has no known allergies.   Social History:  The patient  reports that she has never smoked. She has never used smokeless tobacco. She reports that she does not drink alcohol or use drugs.   Family History:  The patient's  family history includes Colon cancer in her brother; Hyperlipidemia in her sister; Hypertension in her brother and mother; Hypothyroidism in her sister; Kidney disease in her brother and mother; Prostate cancer in her brother and brother; Stomach cancer in her father; Stroke in her brother.    ROS:  Please see the history of present illness.  Otherwise, review of systems is positive for weight gain, leg swelling, hearing loss, depression, back pain, muscle pain, rash, dizziness.  All other systems are reviewed and negative.    PHYSICAL EXAM: VS:  BP (!) 156/94 (BP Location: Left Arm)   Pulse 63   Ht 5\' 8"  (1.727 m)   Wt 241 lb 12.8 oz (109.7 kg)   BMI 36.77 kg/m  , BMI Body mass index is 36.77 kg/m. GEN: pleasant obese woman, in no acute distress  HEENT: normal  Neck: no JVD, no masses. No carotid bruits Cardiac: RRR without murmur or gallop  Respiratory:  clear to auscultation bilaterally, normal work of breathing GI: soft, nontender, nondistended, + BS MS: no deformity or atrophy  Ext: no pretibial edema, pedal pulses 2+= bilaterally Skin: warm and dry, no rash Neuro:  Strength and sensation are intact Psych: euthymic mood, full affect  EKG:  EKG is not ordered today.  Recent Labs: 07/20/2016: ALT 17; BUN 15; Creat 1.10; Hemoglobin 12.8; Platelets 293; Potassium 3.8; Sodium 140   Lipid Panel     Component Value Date/Time   CHOL 198 07/20/2016 1434   CHOL 216 (H) 09/12/2015 1455   TRIG 98 07/20/2016 1434   HDL 61 07/20/2016 1434   HDL 56 09/12/2015 1455   CHOLHDL 3.2 07/20/2016 1434   VLDL  20 07/20/2016 1434   LDLCALC 117 07/20/2016 1434   LDLCALC 139 (H) 09/12/2015 1455      Wt Readings from Last 3 Encounters:  12/09/16 241 lb 12.8 oz (109.7 kg)  10/22/16 244 lb (110.7 kg)  10/18/16 245 lb (111.1 kg)     Cardiac Studies Reviewed: 2D Echo 10-18-2016: Study Conclusions  - Left ventricle: The cavity size was normal. Wall thickness was   normal. Systolic function was normal. The estimated ejection   fraction was in the range of 55% to 60%. Wall motion was normal;   there were no regional wall motion abnormalities. Doppler   parameters are consistent with abnormal left ventricular   relaxation (grade 1 diastolic dysfunction). - Aortic valve: There was no stenosis. - Mitral valve: There was no significant regurgitation. - Left atrium: The atrium was mildly dilated. - Right ventricle: The cavity size was normal. Systolic function   was normal. - Pulmonary arteries: No complete TR doppler jet so unable to   estimate PA systolic pressure. - Inferior vena cava: The vessel was normal in size. The   respirophasic diameter changes were in the normal range (>= 50%),   consistent with normal central venous pressure.  Impressions:  - Normal LV size with EF 55-60%. Normal RV size and systolic   function. No significant valvular abnormalities.  Nuclear Stress Test 10-19-2016: Study Highlights    Nuclear stress EF: 60%.  There was no ST segment deviation noted during stress.  There is a large defect of moderate severity present in the basal inferolateral, mid inferolateral, mid anterior, apical anterior, mid inferoseptal, apical septal, apical inferior, apical lateral and apical locations. The defect is partially reversible. Given normal LVF this likely represents shifting breast tissue attenuation artifact but cannot rule out ischemia.  This is an intermediate risk study. The study is limited by morbid obesity (BMI 39.5).    ASSESSMENT AND PLAN: Shortness of breath.  Echo and nuclear studies reviewed. I discussed treatment options with the patient at length. Considering no chest pain, I do not think cardiac cath is warranted. I strongly suspect the nuclear study is abnormal because of shifting breast attenuation. She understands the importance of weight loss and we discussed specific strategies at diet and exercise. I would be happy to see her back in the future if problems arise. All questions are answered.   Current medicines are reviewed with the patient today.  The patient does not have concerns regarding medicines.  Labs/ tests ordered today include:  No orders of the defined types were placed in this encounter.   Disposition:   FU as needed  Signed, Sherren Mocha, MD  12/09/2016 3:52 PM    [facility] [place], [place], [place]  [address]  Phone: [phone]; Fax: [phone]

## 2017-02-08 DIAGNOSIS — R69 Illness, unspecified: Secondary | ICD-10-CM | POA: Diagnosis not present

## 2017-02-25 ENCOUNTER — Ambulatory Visit: Payer: Medicare HMO

## 2017-02-25 ENCOUNTER — Encounter: Payer: Medicare HMO | Admitting: Internal Medicine

## 2017-02-28 DIAGNOSIS — R69 Illness, unspecified: Secondary | ICD-10-CM | POA: Diagnosis not present

## 2017-03-25 ENCOUNTER — Ambulatory Visit (INDEPENDENT_AMBULATORY_CARE_PROVIDER_SITE_OTHER): Payer: Medicare HMO | Admitting: Internal Medicine

## 2017-03-25 ENCOUNTER — Encounter: Payer: Self-pay | Admitting: Internal Medicine

## 2017-03-25 VITALS — BP 159/98 | HR 68 | Temp 99.1°F | Ht 68.0 in | Wt 237.0 lb

## 2017-03-25 DIAGNOSIS — Z0000 Encounter for general adult medical examination without abnormal findings: Secondary | ICD-10-CM

## 2017-03-25 DIAGNOSIS — Z1211 Encounter for screening for malignant neoplasm of colon: Secondary | ICD-10-CM

## 2017-03-25 DIAGNOSIS — R0602 Shortness of breath: Secondary | ICD-10-CM | POA: Diagnosis not present

## 2017-03-25 DIAGNOSIS — F3175 Bipolar disorder, in partial remission, most recent episode depressed: Secondary | ICD-10-CM | POA: Diagnosis not present

## 2017-03-25 DIAGNOSIS — R0600 Dyspnea, unspecified: Secondary | ICD-10-CM | POA: Insufficient documentation

## 2017-03-25 DIAGNOSIS — Z579 Occupational exposure to unspecified risk factor: Secondary | ICD-10-CM

## 2017-03-25 DIAGNOSIS — E785 Hyperlipidemia, unspecified: Secondary | ICD-10-CM | POA: Diagnosis not present

## 2017-03-25 DIAGNOSIS — Z800 Family history of malignant neoplasm of digestive organs: Secondary | ICD-10-CM | POA: Diagnosis not present

## 2017-03-25 DIAGNOSIS — I10 Essential (primary) hypertension: Secondary | ICD-10-CM | POA: Diagnosis not present

## 2017-03-25 DIAGNOSIS — R0609 Other forms of dyspnea: Secondary | ICD-10-CM

## 2017-03-25 DIAGNOSIS — R69 Illness, unspecified: Secondary | ICD-10-CM | POA: Diagnosis not present

## 2017-03-25 DIAGNOSIS — G8929 Other chronic pain: Secondary | ICD-10-CM

## 2017-03-25 DIAGNOSIS — Z79899 Other long term (current) drug therapy: Secondary | ICD-10-CM

## 2017-03-25 DIAGNOSIS — M25561 Pain in right knee: Secondary | ICD-10-CM | POA: Diagnosis not present

## 2017-03-25 LAB — LIPID PANEL
Cholesterol: 201 mg/dL — ABNORMAL HIGH (ref <200)
HDL: 60 mg/dL (ref >50)
LDL CALC: 119 mg/dL — AB (ref <100)
Total CHOL/HDL Ratio: 3.4 Ratio (ref <5.0)
Triglycerides: 108 mg/dL (ref <150)
VLDL: 22 mg/dL (ref <30)

## 2017-03-25 LAB — CBC WITH DIFFERENTIAL/PLATELET
BASOS ABS: 0 {cells}/uL (ref 0–200)
BASOS PCT: 0 %
EOS ABS: 78 {cells}/uL (ref 15–500)
Eosinophils Relative: 1 %
HCT: 40.8 % (ref 35.0–45.0)
HEMOGLOBIN: 13.6 g/dL (ref 11.7–15.5)
LYMPHS ABS: 2106 {cells}/uL (ref 850–3900)
Lymphocytes Relative: 27 %
MCH: 29.7 pg (ref 27.0–33.0)
MCHC: 33.3 g/dL (ref 32.0–36.0)
MCV: 89.1 fL (ref 80.0–100.0)
MPV: 10.2 fL (ref 7.5–12.5)
Monocytes Absolute: 624 cells/uL (ref 200–950)
Monocytes Relative: 8 %
NEUTROS ABS: 4992 {cells}/uL (ref 1500–7800)
Neutrophils Relative %: 64 %
Platelets: 300 10*3/uL (ref 140–400)
RBC: 4.58 MIL/uL (ref 3.80–5.10)
RDW: 14.6 % (ref 11.0–15.0)
WBC: 7.8 10*3/uL (ref 3.8–10.8)

## 2017-03-25 LAB — COMPLETE METABOLIC PANEL WITH GFR
ALBUMIN: 4.2 g/dL (ref 3.6–5.1)
ALK PHOS: 91 U/L (ref 33–130)
ALT: 15 U/L (ref 6–29)
AST: 18 U/L (ref 10–35)
BILIRUBIN TOTAL: 0.3 mg/dL (ref 0.2–1.2)
BUN: 17 mg/dL (ref 7–25)
CO2: 21 mmol/L (ref 20–31)
CREATININE: 1.25 mg/dL — AB (ref 0.50–0.99)
Calcium: 9.6 mg/dL (ref 8.6–10.4)
Chloride: 105 mmol/L (ref 98–110)
GFR, EST AFRICAN AMERICAN: 51 mL/min — AB (ref >=60)
GFR, EST NON AFRICAN AMERICAN: 44 mL/min — AB (ref >=60)
GLUCOSE: 99 mg/dL (ref 65–99)
Potassium: 3.7 mmol/L (ref 3.5–5.3)
SODIUM: 139 mmol/L (ref 135–146)
TOTAL PROTEIN: 7 g/dL (ref 6.1–8.1)

## 2017-03-25 NOTE — Progress Notes (Signed)
Patient ID: Judy [name], female   DOB: [birth year], 71 y.o.   MRN: [id number]   Location:  [facility]  Place of Service:  OFFICE  Provider: Arletha Grippe, DO  Patient Care Team: [name], DO as PCP - General (Internal Medicine)  Extended Emergency Contact Information Primary Emergency Contact: Alston,Harriet Address: [address]          [place], [place] [address] [place] Phone: [phone] Relation: Sister Secondary Emergency Contact: Nathaneil Canary Address: [place] , [place] Mobile Phone: [phone] Relation: Grandaughter  Code Status: FULL  Goals of Care: Advanced Directive information Advanced Directives 03/25/2017  Does Patient Have a Medical Advance Directive? No  Type of Advance Directive -  Altha in Chart? -  Would patient like information on creating a medical advance directive? -  Pre-existing out of facility DNR order (yellow form or pink MOST form) -     Chief Complaint  Patient presents with  . Annual Exam    physical exam   . MMSE    27/30 Failed clock drawing    HPI: Patient is a 71 y.o. female seen in today for an annual wellness exam. She has arthritic pain in knee that improves with Advil. She has chest tightness at rest and with ambulation. No wheezing. She was employed at [employer], Producer, television/film/video. She was not req'd to wear mask and she did not. She has never smoked cigs but has had second hand smoke exposure at home as a child. No hx child hood asthma. No cough or hemoptysis  Depression/anxiety/bipolar - stable on wellbutrin XL, fluoxetine, latuda and seroquel. Followed by Johna Roles, PAC. A1c 5.6%  HTN - BP elevated. Diet controlled. She had a nuclear stress test in Jan 2018 that was abnormal. She f/u with cardio who thought abnormality was due to shifting breast attenuation. EF nml  Arthritis/chronic right knee pain - occasional joint pain  controlled with advil OTC. She had burning and stinging in right anterior thigh not long ago that resolved with advil  Hyperlipidemia - takes no meds. LDL 117; HDL 61  GERD - takes pantoprazole prn. She takes Linzess prn constipation  Depression screen [facility] 2/9 03/25/2017 06/04/2014  Decreased Interest 0 0  Down, Depressed, Hopeless 1 2  PHQ - 2 Score 1 2  Altered sleeping - 0  Tired, decreased energy - 1  Change in appetite - 0  Feeling bad or failure about yourself  - 0  Trouble concentrating - 0  Moving slowly or fidgety/restless - 0  Suicidal thoughts - 0  PHQ-9 Score - 3    Fall Risk  03/25/2017 07/20/2016 09/12/2015 06/04/2014 03/20/2014  Falls in the past year? _0    MMSE - Mini Mental State Exam 03/25/2017 06/04/2014  Orientation to time 5 5  Orientation to Place 5 5  Registration 3 3  Attention/ Calculation 4 5  Recall 2 2  Language- name 2 objects 2 2  Language- repeat 1 1  Language- follow 3 step command 3 3  Language- read & follow direction 1 1  Write a sentence 1 1  Copy design 0 0  Total score 27 28     Health Maintenance  Topic Date Due  . Hepatitis C Screening  1946-07-15  . TETANUS/TDAP  07/09/1966  . PNA vac Low Risk Adult (2 of 2 - PCV13) 12/12/2014  .  INFLUENZA VACCINE  05/11/2017  . MAMMOGRAM  08/02/2018  . COLONOSCOPY  08/24/2021  . DEXA SCAN  Completed    Urinary incontinence? Nighttime urgency but no incontinence issues  Functional Status Survey: Is the patient deaf or have difficulty hearing?: Yes (she was told she needs hearing aid but unable to afford it) Does the patient have difficulty seeing, even when wearing glasses/contacts?: Yes (last eye exam 1 yr ago) Does the patient have difficulty concentrating, remembering, or making decisions?: No Does the patient have difficulty walking or climbing stairs?: Yes (due to right knee pain) Does the patient have difficulty dressing or bathing?: No Does the patient have difficulty doing  errands alone such as visiting a doctor's office or shopping?: No  Exercise? As tolerated but no set routine  Diet? Attempts to make healthy food choices   Visual Acuity Screening   Right eye Left eye Both eyes  Without correction: 20/70 20/50 20/50  With correction:       Hearing: no tinnitus; unable to afford hearing aids    Dentition: she sees dentist every 6 mos  Pain: right knee, chronic  Past Medical History:  Diagnosis Date  . Allergic rhinitis   . Anemia   . Anxiety   . Arthritis   . Bipolar 1 disorder ([place])   . Depression   . Diverticulosis of colon (without mention of hemorrhage) 08/25/2011   [name]  . 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 Hyperlipidemia   . Hypertension   . Obesity   . Vertigo     Past Surgical History:  Procedure Laterality Date  . COLONOSCOPY  08/25/2011   Procedure: COLONOSCOPY;  Surgeon: Landry Dyke, MD;  Location: [facility];  Service: Endoscopy;  Laterality: N/A;  . DILATION AND CURETTAGE OF UTERUS  1960's    Family History  Problem Relation Age of Onset  . Prostate cancer Brother   . Hypertension Brother   . Kidney disease Brother   . Colon cancer Brother   . Hypertension Mother   . Kidney disease Mother   . Stomach cancer Father   . Hyperlipidemia Sister   . Hypothyroidism Sister   . Stroke Brother   . Prostate cancer Brother    Family Status  Relation Status  . Brother Alive  . Mother Deceased at age 27       kidney failure  . Father Deceased at age 57       stomach cancer  . Sister Alive  . Brother Deceased  . MGM Deceased  . MGF Deceased  . PGM Deceased  . PGF Deceased    shoulder  Social History   Social History  . Marital status: Divorced    Spouse name: N/A  . Number of children: N/A  . Years of education: N/A   Occupational History  . Not on file.   Social History Main Topics  . Smoking status: Never Smoker  . Smokeless tobacco: Never Used  . Alcohol use No  . Drug use:  No  . Sexual activity: No   Other Topics Concern  . Not on file   Social History Narrative   Lives alone in house, does not need assist device.  Works part time at [employer]. Has a son.     Cousin and sister in close proximity    No Known Allergies  Allergies as of 03/25/2017   No Known Allergies     Medication List  Accurate as of 03/25/17  3:26 PM. Always use your most recent med list.          buPROPion 150 MG 24 hr tablet Commonly known as:  WELLBUTRIN XL Take 150 mg by mouth every morning. Take one tablet every morning   FLUoxetine HCl 60 MG Tabs Take 60 mg by mouth every morning.   LATUDA 120 MG Tabs Generic drug:  Lurasidone HCl Take 120 mg by mouth at bedtime. With 350 calories   linaclotide 145 MCG Caps capsule Commonly known as:  LINZESS Take 145 mcg by mouth daily as needed (constipation).   QUEtiapine 300 MG tablet Commonly known as:  SEROQUEL Take 300 mg by mouth at bedtime.        Review of Systems:  Review of Systems  Unable to perform ROS: Psychiatric disorder    Physical Exam: Vitals:   03/25/17 1456  BP: (!) 159/98  Pulse: 68  Temp: 99.1 F (37.3 C)  TempSrc: Oral  SpO2: 98%  Weight: 237 lb (107.5 kg)  Height: 5' 8" (1.727 m)   Body mass index is 36.04 kg/m. Physical Exam  Constitutional: She is oriented to person, place, and time. She appears well-developed and well-nourished. No distress.  HENT:  Head: Normocephalic and atraumatic.  Right Ear: External ear normal.  Left Ear: External ear normal.  Mouth/Throat: Oropharynx is clear and moist. No oropharyngeal exudate.  MMM; no oral thrush  Eyes: EOM are normal. Pupils are equal, round, and reactive to light. No scleral icterus.  Neck: Normal range of motion. Neck supple. Carotid bruit is not present. No tracheal deviation present. No thyromegaly present.  Cardiovascular: Normal rate, regular rhythm, normal heart sounds and intact distal pulses.  Exam  reveals no gallop and no friction rub.   No murmur heard. No LE edema b/l. no calf TTP.   Pulmonary/Chest: Effort normal. No stridor. No respiratory distress. She has decreased breath sounds. She has no wheezes. She has no rhonchi. She has no rales. She exhibits no tenderness. Right breast exhibits no inverted nipple, no mass, no nipple discharge, no skin change and no tenderness. Left breast exhibits no inverted nipple, no mass, no nipple discharge, no skin change and no tenderness. Breasts are symmetrical.  Abdominal: Soft. Bowel sounds are normal. She exhibits no distension and no mass. There is no hepatosplenomegaly or hepatomegaly. There is no tenderness. There is no rebound and no guarding. No hernia.  Musculoskeletal: She exhibits edema (right knee medial swelling with TTP) and tenderness. She exhibits no deformity.  Lymphadenopathy:    She has no cervical adenopathy.  Neurological: She is alert and oriented to person, place, and time. She has normal reflexes.  Skin: Skin is warm and dry. No rash noted.  Multiple sebecums with central plug. No secondary signs of infection  Psychiatric: She has a normal mood and affect. Her behavior is normal. Judgment normal.  Vitals reviewed.   Labs reviewed:  Basic Metabolic Panel:  Recent Labs  07/20/16 1434  NA 140  K 3.8  CL 105  CO2 25  GLUCOSE 90  BUN 15  CREATININE 1.10*  CALCIUM 9.5   Liver Function Tests:  Recent Labs  07/20/16 1434  AST 19  ALT 17  ALKPHOS 80  BILITOT 0.4  PROT 6.8  ALBUMIN 3.8   No results for input(s): LIPASE, AMYLASE in the last 8760 hours. No results for input(s): AMMONIA in the last 8760 hours. CBC:  Recent Labs  07/20/16 1434  WBC 5.9  NEUTROABS  3,304  HGB 12.8  HCT 40.0  MCV 90.5  PLT 293   Lipid Panel:  Recent Labs  07/20/16 1434  CHOL 198  HDL 61  LDLCALC 117  TRIG 98  CHOLHDL 3.2   Lab Results  Component Value Date   HGBA1C 5.6 07/20/2016    Procedures: No results  found.  Assessment/Plan   ICD-10-CM   1. Well adult exam Z00.00   2. Shortness of breath R06.02 CBC with Differential/Platelets    Pulmonary Function Test    CT CHEST WO CONTRAST  3. Essential hypertension, benign I10 CBC with Differential/Platelets    CMP with eGFR    Urinalysis with Reflex Microscopic  4. Depressed bipolar I disorder in partial remission (HCC) F31.75 Hemoglobin A1c  5. Hyperlipidemia with target LDL less than 130 E78.5 Lipid Panel    TSH  6. Chronic pain of right knee M25.561    G89.29   7. High risk medication use Z79.899 CBC with Differential/Platelets    CMP with eGFR    Hemoglobin A1c  8. Colon cancer screening Z12.11 Ambulatory referral to Gastroenterology  9. Family hx of colon cancer Z80.0 Ambulatory referral to Gastroenterology   brother  24. Occupational exposure in workplace Z57.9 Pulmonary Function Test    CT CHEST WO CONTRAST   Continue current medication as ordered  Follow up with specialists as scheduled  Will call with GI referral and lung study appts  Will call with lab results  Follow up in 3 mos for HTN, SOB  Keeping You Healthy HANDOUT given    Cordella Register. Perlie Gold  [facility] [address] [place], [place] [address] [phone] Cell (Monday-Friday 8 AM - 5 PM) [phone] After 5 PM and follow prompts

## 2017-03-25 NOTE — Patient Instructions (Signed)
Continue current medication as ordered  Follow up with specialists as scheduled  Will call with GI referral and lung study appts  Will call with lab results  Follow up in 3 mos for HTN, SOB  Keeping You Healthy  Get These Tests  Blood Pressure- Have your blood pressure checked by your healthcare provider at least once a year.  Normal blood pressure is 120/80.  Weight- Have your body mass index (BMI) calculated to screen for obesity.  BMI is a measure of body fat based on height and weight.  You can calculate your own BMI at [employer]velB[employer].it  Cholesterol- Have your cholesterol checked every year.  Diabetes- Have your blood sugar checked every year if you have high blood pressure, high cholesterol, a family history of diabetes or if you are overweight.  Pap Test - Have a pap test every 1 to 5 years if you have been sexually active.  If you are older than 65 and recent pap tests have been normal you may not need additional pap tests.  In addition, if you have had a hysterectomy  for benign disease additional pap tests are not necessary.  Mammogram-Yearly mammograms are essential for early detection of breast cancer  Screening for Colon Cancer- Colonoscopy starting at age 19. Screening may begin sooner depending on your family history and other health conditions.  Follow up colonoscopy as directed by your Gastroenterologist.  Screening for Osteoporosis- Screening begins at age 25 with bone density scanning, sooner if you are at higher risk for developing Osteoporosis.  Get these medicines  Calcium with Vitamin D- Your body requires 1200-1500 mg of Calcium a day and [id number] IU of Vitamin D a day.  You can only absorb 500 mg of Calcium at a time therefore Calcium must be taken in 2 or 3 separate doses throughout the day.  Hormones- Hormone therapy has been associated with increased risk for certain cancers and heart disease.  Talk to your healthcare provider about if you need  relief from menopausal symptoms.  Aspirin- Ask your healthcare provider about taking Aspirin to prevent Heart Disease and Stroke.  Get these Immuniztions  Flu shot- Every fall  Pneumonia shot- Once after the age of 33; if you are younger ask your healthcare provider if you need a pneumonia shot.  Tetanus- Every ten years.  Zostavax- Once after the age of 37 to prevent shingles.  Take these steps  Don't smoke- Your healthcare provider can help you quit. For tips on how to quit, ask your healthcare provider or go to www.smokefree.gov or call 1-800 QUIT-NOW.  Be physically active- Exercise 5 days a week for a minimum of 30 minutes.  If you are not already physically active, start slow and gradually work up to 30 minutes of moderate physical activity.  Try walking, dancing, bike riding, swimming, etc.  Eat a healthy diet- Eat a variety of healthy foods such as fruits, vegetables, whole grains, low fat milk, low fat cheeses, yogurt, lean meats, chicken, fish, eggs, dried beans, tofu, etc.  For more information go to www.thenutritionsource.org  Dental visit- Brush and floss teeth twice daily; visit your dentist twice a year.  Eye exam- Visit your Optometrist or Ophthalmologist yearly.  Drink alcohol in moderation- Limit alcohol intake to one drink or less a day.  Never drink and drive.  Depression- Your emotional health is as important as your physical health.  If you're feeling down or losing interest in things you normally enjoy, please talk to your healthcare provider.  Seat Belts- can save your life; always wear one  Smoke/Carbon Monoxide detectors- These detectors need to be installed on the appropriate level of your home.  Replace batteries at least once a year.  Violence- If anyone is threatening or hurting you, please tell your healthcare provider.  Living Will/ Health care power of attorney- Discuss with your healthcare provider and family.

## 2017-03-26 LAB — URINALYSIS, MICROSCOPIC ONLY
BACTERIA UA: NONE SEEN [HPF]
Casts: NONE SEEN [LPF]
Crystals: NONE SEEN [HPF]
Yeast: NONE SEEN [HPF]

## 2017-03-26 LAB — URINALYSIS, ROUTINE W REFLEX MICROSCOPIC
BILIRUBIN URINE: NEGATIVE
Glucose, UA: NEGATIVE
HGB URINE DIPSTICK: NEGATIVE
KETONES UR: NEGATIVE
NITRITE: NEGATIVE
SPECIFIC GRAVITY, URINE: 1.021 (ref 1.001–1.035)
pH: 6.5 (ref 5.0–8.0)

## 2017-03-26 LAB — HEMOGLOBIN A1C
Hgb A1c MFr Bld: 5.7 % — ABNORMAL HIGH (ref <5.7)
MEAN PLASMA GLUCOSE: 117 mg/dL

## 2017-03-26 LAB — TSH: TSH: 0.93 m[IU]/L

## 2017-03-31 ENCOUNTER — Other Ambulatory Visit: Payer: Self-pay | Admitting: Internal Medicine

## 2017-04-06 ENCOUNTER — Telehealth: Payer: Self-pay

## 2017-04-06 NOTE — Telephone Encounter (Signed)
A fax was received from [facility] asking that patient be informed of upcoming appointment on 04-15-17 at 11am.  Patient acknowledged understanding. She did ask for the phone number and address to [facility].

## 2017-04-07 ENCOUNTER — Ambulatory Visit
Admission: RE | Admit: 2017-04-07 | Discharge: 2017-04-07 | Disposition: A | Discharge status: Home / Self Care | Payer: Medicare HMO | Source: Ambulatory Visit | Attending: Internal Medicine | Admitting: Internal Medicine

## 2017-04-07 DIAGNOSIS — R0602 Shortness of breath: Secondary | ICD-10-CM

## 2017-04-07 DIAGNOSIS — Z579 Occupational exposure to unspecified risk factor: Secondary | ICD-10-CM

## 2017-04-07 DIAGNOSIS — R911 Solitary pulmonary nodule: Secondary | ICD-10-CM | POA: Diagnosis not present

## 2017-04-12 ENCOUNTER — Telehealth: Payer: Self-pay | Admitting: Gastroenterology

## 2017-04-12 NOTE — Telephone Encounter (Signed)
Please review and advise appropriate colon recall

## 2017-04-14 NOTE — Telephone Encounter (Signed)
Left message for patient to call back  

## 2017-04-14 NOTE — Telephone Encounter (Signed)
I need to know how old her brother was when he was dx colon cancer -

## 2017-04-15 ENCOUNTER — Encounter: Payer: Self-pay | Admitting: Internal Medicine

## 2017-04-15 ENCOUNTER — Ambulatory Visit (INDEPENDENT_AMBULATORY_CARE_PROVIDER_SITE_OTHER): Payer: Medicare HMO | Admitting: Internal Medicine

## 2017-04-15 ENCOUNTER — Other Ambulatory Visit (INDEPENDENT_AMBULATORY_CARE_PROVIDER_SITE_OTHER): Payer: Medicare HMO

## 2017-04-15 VITALS — BP 136/86 | HR 61 | Ht 69.0 in | Wt 243.0 lb

## 2017-04-15 DIAGNOSIS — R0602 Shortness of breath: Secondary | ICD-10-CM

## 2017-04-15 DIAGNOSIS — R0600 Dyspnea, unspecified: Secondary | ICD-10-CM

## 2017-04-15 DIAGNOSIS — R0609 Other forms of dyspnea: Secondary | ICD-10-CM

## 2017-04-15 DIAGNOSIS — Z579 Occupational exposure to unspecified risk factor: Secondary | ICD-10-CM

## 2017-04-15 LAB — BRAIN NATRIURETIC PEPTIDE: PRO B NATRI PEPTIDE: 23 pg/mL (ref 0.0–100.0)

## 2017-04-15 MED ORDER — PANTOPRAZOLE SODIUM 40 MG PO TBEC: 40.0000 mg | DELAYED_RELEASE_TABLET | Freq: Every day | ORAL | 2 refills | Status: DC

## 2017-04-15 MED ORDER — FAMOTIDINE 20 MG PO TABS: ORAL_TABLET | ORAL | 11 refills | Status: DC

## 2017-04-15 NOTE — Progress Notes (Signed)
Subjective:     Patient ID: Judy [name], female   DOB: [birth year],    MRN: [id number]  HPI  59 yobf   never smoker  never resp problems until early  spring 2018 with indolent onset noted sob walking and eating the same day and worse since then and present all the time except when supine/sleeping referred to pulmonary clinic 04/15/2017 by Dr  Eulas Post   04/15/2017 1st [facility] office visit/ [facility]   Chief Complaint  Patient presents with  . Follow-up    Pt was here for a PFT but was unable to complete it. Pt states that she is SOB x2 months. Denies any chest pain or cough.  was able to walk a mile a year prior to [place]  s stopping and gradually worse to point where sob  MMRC3 = can't walk 100 yards even at a slow pace at a flat grade s stopping due to sob  eg can do HT but not WM  Also sob at rest when trying to eat but denies dysphagia    No obvious day to day or daytime variability or assoc excess/ purulent sputum or mucus plugs or hemoptysis or cp or chest tightness, subjective wheeze or overt sinus or hb symptoms. No unusual exp hx or h/o childhood pna/ asthma or knowledge of premature birth.  Sleeping ok without nocturnal  or early am exacerbation  of respiratory  c/o's or need for noct saba. Also denies any obvious fluctuation of symptoms with weather or environmental changes or other aggravating or alleviating factors except as outlined above   Current Medications, Allergies, Complete Past Medical History, Past Surgical History, Family History, and Social History were reviewed in [employer] record.  ROS  The following are not active complaints unless bolded sore throat, dysphagia, dental problems, itching, sneezing,  nasal congestion or excess/ purulent secretions, ear ache,   fever, chills, sweats, unintended wt loss, classically pleuritic or exertional cp,  orthopnea pnd or leg swelling, presyncope, palpitations, abdominal pain, anorexia, nausea, vomiting,  diarrhea  or change in bowel or bladder habits, change in stools or urine, dysuria,hematuria,  rash, arthralgias, visual complaints, headache, numbness, weakness or ataxia or problems with walking or coordination,  change in mood/affect or memory.           Review of Systems     Objective:   Physical Exam    amb obese bf nad   Wt Readings from Last 3 Encounters:  04/15/17 243 lb (110.2 kg)  03/25/17 237 lb (107.5 kg)  12/09/16 241 lb 12.8 oz (109.7 kg)    Vital signs reviewed - Note on arrival 02 sats  100% on RA     HEENT: nl dentition, turbinates bilaterally, and oropharynx. Nl external ear canals without cough reflex   NECK :  without JVD/Nodes/TM/ nl carotid upstrokes bilaterally   LUNGS: no acc muscle use,  Nl contour chest which is clear to A and P bilaterally without cough on insp or exp maneuvers   CV:  RRR  no s3 or murmur or increase in P2, and no edema   ABD:  Obese/ soft and nontender with nl inspiratory excursion in the supine position. No bruits or organomegaly appreciated, bowel sounds nl  MS:  Nl gait/ ext warm without deformities, calf tenderness, cyanosis or clubbing No obvious joint restrictions   SKIN: warm and dry without lesions    NEURO:  alert, approp, nl sensorium with  no motor or cerebellar deficits apparent.  I personally reviewed images and agree with radiology impression as follows:   Chest CT w/o contrast 04/08/17  No acute intrathoracic process.  Right upper lobe 6 mm ground-glass nodule and left upper lobe 3 mm subpleural nodule.  Initial follow-up with CT at 6-12 months is recommended to confirm Persistence.   Labs ordered/ reviewed:      Chemistry      Component Value Date/Time   NA 139 03/25/2017 1550   NA 140 09/12/2015 1455   K 3.7 03/25/2017 1550   CL 105 03/25/2017 1550   CO2 21 03/25/2017 1550   BUN 17 03/25/2017 1550   BUN 14 09/12/2015 1455   CREATININE 1.25 (H) 03/25/2017 1550      Component Value  Date/Time   CALCIUM 9.6 03/25/2017 1550   ALKPHOS 91 03/25/2017 1550   AST 18 03/25/2017 1550   ALT 15 03/25/2017 1550   BILITOT 0.3 03/25/2017 1550   BILITOT 0.3 09/12/2015 1455        Lab Results  Component Value Date   WBC 7.8 03/25/2017   HGB 13.6 03/25/2017   HCT 40.8 03/25/2017   MCV 89.1 03/25/2017   PLT 300 03/25/2017     Lab Results  Component Value Date   DDIMER 0.95 (H) 04/15/2017      Lab Results  Component Value Date   TSH 0.93 03/25/2017     Lab Results  Component Value Date   PROBNP 23.0 04/15/2017                     Assessment:

## 2017-04-15 NOTE — Telephone Encounter (Signed)
Patient contacted and she does not know the age of her brother when he was diagnosed with colon cancer

## 2017-04-15 NOTE — Assessment & Plan Note (Addendum)
04/15/2017   Walked RA  2 laps @ 185 ft each stopped due to  Sob/ no desats/ nl pace - Spirometry 04/15/2017  FEV1 1.78 (76%)  Ratio 78 s curvature p no rx    Symptoms are markedly disproportionate to objective findings and not clear this is actually much of a  lung problem but pt does appear to have difficult to sort out respiratory symptoms of unknown origin for which  DDX  = almost all start with A and  include Adherence, Ace Inhibitors, Acid Reflux, Active Sinus Disease, Alpha 1 Antitripsin deficiency, Anxiety masquerading as Airways dz,  ABPA,  Allergy(esp in young), Aspiration (esp in elderly), Adverse effects of meds,  Active smokers, A bunch of PE's (a small clot burden can't cause this syndrome unless there is already severe underlying pulm or vascular dz with poor reserve) plus two Bs  = Bronchiectasis and Beta blocker use..and one C= CHF     Adherence is always the initial "prime suspect" and is a multilayered concern that requires a "trust but verify" approach in every patient - starting with knowing how to use medications, especially inhalers, correctly, keeping up with refills and understanding the fundamental difference between maintenance and prns vs those medications only taken for a very short course and then stopped and not refilled.  - return if not better with all meds in hand using a trust but verify approach to confirm accurate Medication  Reconciliation The principal here is that until we are certain that the  patients are doing what we've asked, it makes no sense to ask them to do more.    ? Acid (or non-acid) GERD > always difficult to exclude as up to 75% of pts in some series report no assoc GI/ Heartburn symptoms and note she is worse after eating > rec max (24h)  acid suppression and diet restrictions/ reviewed and instructions given in writing.   ? Anxiety > usually at the bottom of this list of usual suspects but should be much higher on this pt's based on H and P and note  already on psychotropics  > Follow up per Primary Care planned    ? Allergy/ asthma > no rhinitis or noct symptoms so very unlikely  ? A bunch of PEs > D dimer nl - while  A high nl valute  may miss small peripheral pe, the clot burden with sob is moderately high and her  d dimer has a   high neg pred value in this setting but unfortunately does not exclude pet and she is at risk due to obesity/ immobility so need CTa to complete the w/u    ? CHF >  Last echo 01/9701 pos GI diastolic dysfunction >>   BNP of only 23 excludes current chf   rec reconditioning/ f/u 6 weeks  Total time devoted to counseling  > 50 % of initial 60 min office visit:  review case with pt/ discussion of options/alternatives/ personally creating written customized instructions  in presence of pt  then going over those specific  Instructions directly with the pt including how to use all of the meds but in particular covering each new medication in detail and the difference between the maintenance= "automatic" meds and the prns using an action plan format for the latter (If this problem/symptom => do that organization reading Left to right).  Please see AVS from this visit for a full list of these instructions which I personally wrote for this pt and  are unique to this visit.

## 2017-04-15 NOTE — Progress Notes (Signed)
PFT could not be completed today due to the pt's inability to comprehend how the test should be performed.

## 2017-04-15 NOTE — Assessment & Plan Note (Signed)
Body mass index is 35.88 kg/m.  -  trending up Lab Results  Component Value Date   TSH 0.93 03/25/2017     Contributing to gerd risk/ doe/reviewed the need and the process to achieve and maintain neg calorie balance > defer f/u primary care including intermittently monitoring thyroid status

## 2017-04-15 NOTE — Patient Instructions (Addendum)
Pantoprazole (protonix) 40 mg   Take  30-60 min before first meal of the day and Pepcid (famotidine)  20 mg one @  bedtime until return to office - this is the best way to tell whether stomach acid is contributing to your problem.   GERD (REFLUX)  is an extremely common cause of respiratory symptoms just like yours , many times with no obvious heartburn at all.    It can be treated with medication, but also with lifestyle changes including elevation of the head of your bed (ideally with 6 inch  bed blocks),  Smoking cessation, avoidance of late meals, excessive alcohol, and avoid fatty foods, chocolate, peppermint, colas, red wine, and acidic juices such as orange juice.  NO MINT OR MENTHOL PRODUCTS SO NO COUGH DROPS   USE SUGARLESS CANDY INSTEAD (Jolley ranchers or Stover's or Life Savers) or even ice chips will also do - the key is to swallow to prevent all throat clearing. NO OIL BASED VITAMINS - use powdered substitutes.     Please remember to go to the lab department downstairs in the basement  for your tests - we will call you with the results when they are available.  Please schedule a follow up office visit in 6 weeks, call sooner if needed with all medications /inhalers/ solutions in hand so we can verify exactly what you are taking. This includes all medications from all doctors and over the counters   add: d dimer non dx > CTa rec

## 2017-04-16 LAB — D-DIMER, QUANTITATIVE (NOT AT ARMC): D DIMER QUANT: 0.95 ug{FEU}/mL — AB (ref <0.50)

## 2017-04-17 NOTE — Telephone Encounter (Signed)
OK - if sh thinks he was less than 3 or so should have colonoscopy now if she thinks older than that its 10 years after the last. If cannot narrow it down that way then would repeat it now

## 2017-04-18 ENCOUNTER — Ambulatory Visit (INDEPENDENT_AMBULATORY_CARE_PROVIDER_SITE_OTHER)
Admission: RE | Admit: 2017-04-18 | Discharge: 2017-04-18 | Disposition: A | Discharge status: Home / Self Care | Payer: Medicare HMO | Source: Ambulatory Visit | Attending: Internal Medicine | Admitting: Internal Medicine

## 2017-04-18 ENCOUNTER — Other Ambulatory Visit: Payer: Self-pay | Admitting: Internal Medicine

## 2017-04-18 DIAGNOSIS — R0600 Dyspnea, unspecified: Secondary | ICD-10-CM

## 2017-04-18 DIAGNOSIS — R0609 Other forms of dyspnea: Secondary | ICD-10-CM | POA: Diagnosis not present

## 2017-04-18 DIAGNOSIS — R7989 Other specified abnormal findings of blood chemistry: Secondary | ICD-10-CM | POA: Diagnosis not present

## 2017-04-18 DIAGNOSIS — R0602 Shortness of breath: Secondary | ICD-10-CM | POA: Diagnosis not present

## 2017-04-18 LAB — PULMONARY FUNCTION TEST
FEF 25-75 Pre: 1.46 L/sec
FEF2575-%PRED-PRE: 70 %
FEV1-%PRED-PRE: 65 %
FEV1-PRE: 1.52 L
FEV1FVC-%PRED-PRE: 105 %
FEV6-%Pred-Pre: 65 %
FEV6-PRE: 1.87 L
FEV6FVC-%Pred-Pre: 103 %
FVC-%Pred-Pre: 63 %
FVC-Pre: 1.87 L
Pre FEV1/FVC ratio: 81 %
Pre FEV6/FVC Ratio: 100 %

## 2017-04-18 LAB — RESPIRATORY ALLERGY PROFILE REGION II ~~LOC~~
ALLERGEN, COMM SILVER BIRCH, T3: 9.21 kU/L — AB
Allergen, A. alternata, m6: <0.1 kU/L
Allergen, Cedar tree, t12: <0.1 kU/L
Allergen, Cottonwood, t14: <0.1 kU/L
Allergen, D pternoyssinus,d7: <0.1 kU/L
Allergen, Mouse Urine Protein, e78: <0.1 kU/L
Allergen, Oak,t7: 0.13 kU/L — ABNORMAL HIGH
Aspergillus fumigatus, m3: <0.1 kU/L
Bermuda Grass: 0.92 kU/L — ABNORMAL HIGH
Cat Dander: <0.1 kU/L
D. farinae: <0.1 kU/L
IGE (IMMUNOGLOBULIN E), SERUM: 117 kU/L — AB (ref <115)
Johnson Grass: 0.78 kU/L — ABNORMAL HIGH
Pecan/Hickory Tree IgE: <0.1 kU/L
Timothy Grass: 2.17 kU/L — ABNORMAL HIGH

## 2017-04-18 MED ORDER — IOPAMIDOL (ISOVUE-370) INJECTION 76%
80.0000 mL | Freq: Once | INTRAVENOUS | Status: AC | PRN
  Administered 2017-04-18: 80 mL via INTRAVENOUS

## 2017-04-18 NOTE — Progress Notes (Signed)
Spoke with pt and notified of results per Dr. Wert. Pt verbalized understanding and denied any questions. 

## 2017-04-18 NOTE — Progress Notes (Signed)
LMTCB

## 2017-04-18 NOTE — Telephone Encounter (Signed)
See the message from Dr. Carlean Purl

## 2017-04-21 DIAGNOSIS — R69 Illness, unspecified: Secondary | ICD-10-CM | POA: Diagnosis not present

## 2017-04-21 DIAGNOSIS — M13862 Other specified arthritis, left knee: Secondary | ICD-10-CM | POA: Diagnosis not present

## 2017-04-21 DIAGNOSIS — M13861 Other specified arthritis, right knee: Secondary | ICD-10-CM | POA: Diagnosis not present

## 2017-04-21 DIAGNOSIS — R0602 Shortness of breath: Secondary | ICD-10-CM | POA: Diagnosis not present

## 2017-04-21 DIAGNOSIS — I10 Essential (primary) hypertension: Secondary | ICD-10-CM | POA: Diagnosis not present

## 2017-04-21 DIAGNOSIS — M13841 Other specified arthritis, right hand: Secondary | ICD-10-CM | POA: Diagnosis not present

## 2017-04-21 DIAGNOSIS — K5900 Constipation, unspecified: Secondary | ICD-10-CM | POA: Diagnosis not present

## 2017-04-21 DIAGNOSIS — K219 Gastro-esophageal reflux disease without esophagitis: Secondary | ICD-10-CM | POA: Diagnosis not present

## 2017-04-21 DIAGNOSIS — Z0000 Encounter for general adult medical examination without abnormal findings: Secondary | ICD-10-CM | POA: Diagnosis not present

## 2017-05-10 ENCOUNTER — Encounter: Payer: Self-pay | Admitting: Internal Medicine

## 2017-05-10 NOTE — Telephone Encounter (Signed)
Patient returned my call and states that her brothers were younger than 66 when diagnosed. Advised patient that we can sch and she wants to cb to sch. Patient is a Dr.Stark patient though. So can we sch with Dr.Stark?

## 2017-05-10 NOTE — Telephone Encounter (Signed)
Yes should be stark when she calls back

## 2017-05-15 ENCOUNTER — Emergency Department (HOSPITAL_COMMUNITY): Payer: Medicare HMO

## 2017-05-15 ENCOUNTER — Emergency Department (HOSPITAL_COMMUNITY)
Admission: EM | Admit: 2017-05-15 | Discharge: 2017-05-16 | Disposition: A | Discharge status: Home / Self Care | Payer: Medicare HMO | Attending: Emergency Medicine | Admitting: Emergency Medicine

## 2017-05-15 ENCOUNTER — Encounter (HOSPITAL_COMMUNITY): Payer: Self-pay | Admitting: Nurse Practitioner

## 2017-05-15 DIAGNOSIS — M79604 Pain in right leg: Secondary | ICD-10-CM

## 2017-05-15 DIAGNOSIS — M79605 Pain in left leg: Secondary | ICD-10-CM | POA: Diagnosis not present

## 2017-05-15 DIAGNOSIS — M79652 Pain in left thigh: Secondary | ICD-10-CM | POA: Diagnosis not present

## 2017-05-15 DIAGNOSIS — R451 Restlessness and agitation: Secondary | ICD-10-CM

## 2017-05-15 DIAGNOSIS — Z79899 Other long term (current) drug therapy: Secondary | ICD-10-CM | POA: Insufficient documentation

## 2017-05-15 DIAGNOSIS — J90 Pleural effusion, not elsewhere classified: Secondary | ICD-10-CM | POA: Diagnosis not present

## 2017-05-15 DIAGNOSIS — I10 Essential (primary) hypertension: Secondary | ICD-10-CM | POA: Diagnosis not present

## 2017-05-15 DIAGNOSIS — R0602 Shortness of breath: Secondary | ICD-10-CM

## 2017-05-15 DIAGNOSIS — R221 Localized swelling, mass and lump, neck: Secondary | ICD-10-CM | POA: Diagnosis not present

## 2017-05-15 DIAGNOSIS — M79651 Pain in right thigh: Secondary | ICD-10-CM | POA: Diagnosis not present

## 2017-05-15 DIAGNOSIS — R69 Illness, unspecified: Secondary | ICD-10-CM | POA: Diagnosis not present

## 2017-05-15 LAB — COMPREHENSIVE METABOLIC PANEL
ALBUMIN: 3.9 g/dL (ref 3.5–5.0)
ALK PHOS: 73 U/L (ref 38–126)
ALT: 31 U/L (ref 14–54)
AST: 44 U/L — AB (ref 15–41)
Anion gap: 9 (ref 5–15)
BUN: 14 mg/dL (ref 6–20)
CALCIUM: 9.4 mg/dL (ref 8.9–10.3)
CHLORIDE: 108 mmol/L (ref 101–111)
CO2: 26 mmol/L (ref 22–32)
CREATININE: 0.96 mg/dL (ref 0.44–1.00)
GFR calc Af Amer: >60 mL/min (ref >60)
GFR calc non Af Amer: 59 mL/min — ABNORMAL LOW (ref >60)
GLUCOSE: 105 mg/dL — AB (ref 65–99)
Potassium: 3.5 mmol/L (ref 3.5–5.1)
SODIUM: 143 mmol/L (ref 135–145)
Total Bilirubin: 0.4 mg/dL (ref 0.3–1.2)
Total Protein: 7 g/dL (ref 6.5–8.1)

## 2017-05-15 LAB — CBC WITH DIFFERENTIAL/PLATELET
BASOS ABS: 0 10*3/uL (ref 0.0–0.1)
BASOS PCT: 0 %
EOS ABS: 0.2 10*3/uL (ref 0.0–0.7)
EOS PCT: 3 %
HCT: 36.8 % (ref 36.0–46.0)
HEMOGLOBIN: 12.6 g/dL (ref 12.0–15.0)
LYMPHS ABS: 2.7 10*3/uL (ref 0.7–4.0)
Lymphocytes Relative: 31 %
MCH: 29.6 pg (ref 26.0–34.0)
MCHC: 34.2 g/dL (ref 30.0–36.0)
MCV: 86.6 fL (ref 78.0–100.0)
Monocytes Absolute: 0.8 10*3/uL (ref 0.1–1.0)
Monocytes Relative: 9 %
NEUTROS PCT: 57 %
Neutro Abs: 4.9 10*3/uL (ref 1.7–7.7)
PLATELETS: 260 10*3/uL (ref 150–400)
RBC: 4.25 MIL/uL (ref 3.87–5.11)
RDW: 15.3 % (ref 11.5–15.5)
WBC: 8.6 10*3/uL (ref 4.0–10.5)

## 2017-05-15 LAB — BLOOD GAS, ARTERIAL
ACID-BASE EXCESS: 2.8 mmol/L — AB (ref 0.0–2.0)
Bicarbonate: 25 mmol/L (ref 20.0–28.0)
DRAWN BY: 11249
FIO2: 21
O2 SAT: 97.7 %
PH ART: 7.506 — AB (ref 7.350–7.450)
Patient temperature: 98.8
pCO2 arterial: 31.9 mmHg — ABNORMAL LOW (ref 32.0–48.0)
pO2, Arterial: 98 mmHg (ref 83.0–108.0)

## 2017-05-15 LAB — I-STAT TROPONIN, ED: Troponin i, poc: 0 ng/mL (ref 0.00–0.08)

## 2017-05-15 LAB — BRAIN NATRIURETIC PEPTIDE: B NATRIURETIC PEPTIDE 5: 38.7 pg/mL (ref 0.0–100.0)

## 2017-05-15 LAB — TSH: TSH: 0.659 u[IU]/mL (ref 0.350–4.500)

## 2017-05-15 MED ORDER — BENZTROPINE MESYLATE 1 MG/ML IJ SOLN
1.0000 mg | Freq: Once | INTRAMUSCULAR | Status: AC
  Administered 2017-05-15: 1 mg via INTRAVENOUS
  Filled 2017-05-15: qty 2

## 2017-05-15 MED ORDER — BENZTROPINE MESYLATE 1 MG PO TABS: 1.0000 mg | ORAL_TABLET | Freq: Two times a day (BID) | ORAL | 0 refills | Status: DC

## 2017-05-15 MED ORDER — IOPAMIDOL (ISOVUE-300) INJECTION 61%
INTRAVENOUS | Status: AC
  Administered 2017-05-15: 75 mL via INTRAVENOUS
  Filled 2017-05-15: qty 75

## 2017-05-15 NOTE — ED Triage Notes (Signed)
Pt is c/o right leg pain localized at her thigh, she describes it similar to claudication, she is also stating for the last 4 months she has difficulty breathing, has been evaluated by pulmonologist and they "didn't have answers." She is most concerned about the leg pain.

## 2017-05-15 NOTE — ED Provider Notes (Signed)
[facility] DEPT Provider Note   CSN: [id number] Arrival date & time: 05/15/17  1843     History   Chief Complaint Chief Complaint  Patient presents with  . Leg Pain  . Difficulty Breathing    HPI Judy [name] is a 71 y.o. female.  HPI  Judy [name] is a 71 y.o. female with hx of allergic rhinitis, bipolar disorder, depression, HTN, osteoarthritis with chronic knee pain, presents to emergency department with her sister with complaint of shortness of breath and bilateral leg pain. Patient's sister is providing most of the history. She states the patient has had all of the symptoms she is having now for about 4 months. She has been seen by primary care doctor, gastroenterologist, pulmonologist, neurologist, psychiatrist and no one can figure out what is going on. She recently saw a pulmonologist who did a d-dimer, which was 0.9, CT angiogram was negative for PE. Patient was not given a good explanation for her shortness of breath, but was told this could be acid reflux and started on antacids. She states she is feeling worse now. She states that her shortness of breath is worse when she is eating. She denies worsening shortness of breath on exertion when laying down flat. She denies any cough. No fever or chills.  Patient is also complaining of bilateral thigh and knee pain. She states this has been going on for 4 months as well. She states the pain radiates from the thighs and to the front and back of the knees. Worse whenever she is walking. Patient also is complaining of twitching of her legs. She states she was tested for restless legs and was told that she does not have that. Family is very upset that no answers have been provided to them so far and decided to bring her to the emergency department this Sunday evening.    Past Medical History:  Diagnosis Date  . Allergic rhinitis   . Anemia   . Anxiety   . Arthritis   . Bipolar 1 disorder ([place])   . Depression   .  Diverticulosis of colon (without mention of hemorrhage) 08/25/2011   [name]  . 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 Hyperlipidemia   . Hypertension   . Obesity   . Vertigo     Patient Active Problem List   Diagnosis Date Noted  . Chronic pain of right knee 03/25/2017  . High risk medication use 03/25/2017  . Family hx of colon cancer 03/25/2017  . Occupational exposure in workplace 03/25/2017  . Depressed bipolar I disorder in partial remission ([place]) 03/25/2017  . DOE (dyspnea on exertion) 03/25/2017  . Routine general medical examination at a health care facility 06/04/2014  . Hyperlipidemia with target LDL less than 130 03/20/2014  . Essential hypertension, benign 03/20/2014  . Unspecified constipation 03/20/2014  . Light headedness 03/20/2014  . Acute on chronic kidney disease, stage 3 03/20/2014  . Hypokalemia 03/20/2014  . Encounter for therapeutic drug monitoring 03/20/2014  . Other dysphagia 12/26/2013  . GERD (gastroesophageal reflux disease) 12/21/2013  . Constipation 12/21/2013  . Syncope 12/10/2013  . Leukocytosis 12/10/2013  . URI (upper respiratory infection) 12/10/2013  . Bipolar 1 disorder ([place])   . Morbid obesity due to excess calories ([place])   . Hearing loss in left ear   . Vertigo   . HYPERCHOLESTEROLEMIA 10/08/2010  . DEPRESSION 10/08/2010  . HYPERTENSION 10/08/2010  . ALLERGIC RHINITIS 10/08/2010    Past  Surgical History:  Procedure Laterality Date  . COLONOSCOPY  08/25/2011   Procedure: COLONOSCOPY;  Surgeon: Landry Dyke, MD;  Location: [facility] ENDOSCOPY;  Service: Endoscopy;  Laterality: N/A;  . DILATION AND CURETTAGE OF UTERUS  1960's    OB History    No data available       Home Medications    Prior to Admission medications   Medication Sig Start Date End Date Taking? Authorizing Provider  buPROPion (WELLBUTRIN XL) 150 MG 24 hr tablet Take 150 mg by mouth every morning. Take one tablet every morning 02/28/17   [provider]  famotidine (PEPCID) 20 MG tablet One at bedtime 04/15/17   Tanda Rockers, MD  FLUoxetine HCl 60 MG TABS Take 60 mg by mouth every morning. 08/03/16   [provider]  LATUDA 120 MG TABS Take 120 mg by mouth at bedtime. With 350 calories 07/31/16   [provider]  linaclotide (LINZESS) 145 MCG CAPS capsule Take 145 mcg by mouth daily as needed (constipation).    [provider]  pantoprazole (PROTONIX) 40 MG tablet Take 1 tablet (40 mg total) by mouth daily. Take 30-60 min before first meal of the day 04/15/17   Tanda Rockers, MD  QUEtiapine (SEROQUEL) 300 MG tablet Take 300 mg by mouth at bedtime.    [provider]    Family History Family History  Problem Relation Age of Onset  . Prostate cancer Brother   . Hypertension Brother   . Kidney disease Brother   . Colon cancer Brother   . Hypertension Mother   . Kidney disease Mother   . Stomach cancer Father   . Hyperlipidemia Sister   . Hypothyroidism Sister   . Stroke Brother   . Prostate cancer Brother     Social History Social History  Substance Use Topics  . Smoking status: Never Smoker  . Smokeless tobacco: Never Used  . Alcohol use No     Allergies   Patient has no known allergies.   Review of Systems Review of Systems  Constitutional: Negative for chills and fever.  HENT: Positive for trouble swallowing.   Respiratory: Positive for shortness of breath. Negative for cough and chest tightness.   Cardiovascular: Negative for chest pain, palpitations and leg swelling.  Gastrointestinal: Negative for abdominal pain, diarrhea, nausea and vomiting.  Genitourinary: Negative for dysuria, flank pain and pelvic pain.  Musculoskeletal: Positive for arthralgias and myalgias. Negative for neck pain and neck stiffness.  Skin: Negative for rash.  Neurological: Negative for dizziness, weakness and headaches.  All other systems reviewed and are negative.    Physical  Exam Updated Vital Signs BP (!) 152/96 (BP Location: Left Arm)   Pulse 82   Temp 99.2 F (37.3 C) (Oral)   Resp 18   SpO2 98%   Physical Exam  Constitutional: She is oriented to person, place, and time. She appears well-developed and well-nourished. No distress.  HENT:  Head: Normocephalic.  Tongue enlarged, oropharynx normal. Fullness over submandibular area, tender to touch over right submandibular area  Eyes: Conjunctivae are normal.  Neck: Neck supple.  Cardiovascular: Normal rate, regular rhythm and normal heart sounds.   Pulmonary/Chest: Breath sounds normal. She has no wheezes. She has no rales.  Tachypneic. Speaking with teeth closed. Breathing through nose  Abdominal: Soft. Bowel sounds are normal. She exhibits no distension. There is no tenderness. There is no rebound.  Musculoskeletal: She exhibits no edema.  DP pulses intact and equal bialterally.  TTP diffusely over bilateral knees. Full ROM of bilateral hips, knees, ankles.   Neurological: She is alert and oriented to person, place, and time. She displays normal reflexes. No cranial nerve deficit. Coordination normal.  Skin: Skin is warm and dry.  Psychiatric: She has a normal mood and affect. Her behavior is normal.  Nursing note and vitals reviewed.    ED Treatments / Results  Labs (all labs ordered are listed, but only abnormal results are displayed) Labs Reviewed  COMPREHENSIVE METABOLIC PANEL - Abnormal; Notable for the following:       Result Value   Glucose, Bld 105 (*)    AST 44 (*)    GFR calc non Af Amer 59 (*)    All other components within normal limits  BLOOD GAS, ARTERIAL - Abnormal; Notable for the following:    pH, Arterial 7.506 (*)    pCO2 arterial 31.9 (*)    Acid-Base Excess 2.8 (*)    All other components within normal limits  CBC WITH DIFFERENTIAL/PLATELET  BRAIN NATRIURETIC PEPTIDE  TSH  I-STAT TROPONIN, ED    EKG  EKG Interpretation None       Radiology Dg Chest 2  View  Result Date: 05/15/2017 CLINICAL DATA:  Dyspnea x9 months EXAM: CHEST  2 VIEW COMPARISON:  04/18/2017 FINDINGS: The heart size and mediastinal contours are within normal limits. Uncoiling of the thoracic aorta without aneurysm. Blunting of the right lateral costophrenic angle may represent a trace right effusion versus pleural thickening. Both lungs are clear. The visualized skeletal structures are stable with midthoracic kyphosis attributable to multilevel disc space narrowing and mild chronic anterior wedging of T7 and T8. IMPRESSION: No active cardiopulmonary disease. Trace right pleural effusion versus minimal pleural thickening. Electronically Signed   By: Ashley Royalty M.D.   On: 05/15/2017 20:20   Ct Soft Tissue Neck W Contrast  Result Date: 05/15/2017 CLINICAL DATA:  Throat and tongue swelling for several days. RIGHT leg pain. EXAM: CT NECK WITH CONTRAST TECHNIQUE: Multidetector CT imaging of the neck was performed using the standard protocol following the bolus administration of intravenous contrast. CONTRAST:  75 cc ISOVUE-300 IOPAMIDOL (ISOVUE-300) INJECTION 61% COMPARISON:  MRI of the head Mar 06, 2014 FINDINGS: Moderately motion degraded examination. PHARYNX AND LARYNX: Normal.  Widely patent airway. SALIVARY GLANDS: Normal. THYROID: Mildly heterogeneous LEFT thyroid without dominant nodule. LYMPH NODES: No lymphadenopathy by CT size criteria. VASCULAR: Normal. LIMITED INTRACRANIAL: Normal. VISUALIZED ORBITS: Normal. MASTOIDS AND VISUALIZED PARANASAL SINUSES: Well-aerated. SKELETON: Nonacute. Multilevel moderate degenerative change of the cervical spine. Moderate to severe RIGHT C4-5, moderate to severe RIGHT C5-6 neural foraminal narrowing. UPPER CHEST: Lung apices are clear. No superior mediastinal lymphadenopathy. OTHER: None. IMPRESSION: No acute process in the neck on this moderately motion degraded examination. Patent airway. Electronically Signed   By: Elon Alas M.D.   On:  05/15/2017 22:24    Procedures Procedures (including critical care time)  Medications Ordered in ED Medications - No data to display   Initial Impression / Assessment and Plan / ED Course  I have reviewed the triage vital signs and the nursing notes.  Pertinent labs & imaging results that were available during my care of the patient were reviewed by me and considered in my medical decision making (see chart for details).   patient seen and examined. Patient with ongoing difficulty breathing and leg pain for several/4 months. She does appear to be uncomfortable on my exam, she is to keep neck, she speaking and keeping  her mouth closed at all times. On exam, her tongue is very large, she is able to swallow saliva but states it is difficult, also states it difficult to eat. She has some fullness and mild tenderness to palpation of her right submandibular area. Will get CT of her neck. We'll get labs, chest x-ray. Will get EKG and monitor. Patient does have some movement in her legs which is at rest, question medication reaction or restless leg syndrome. Her legs do not appear to be edematous. Dorsal pedal pulses are intact and equal bilaterally.  Patient was started on Wellbutrin 1 month ago. Family state symptoms began before that. Pt already on latuda, Seroquel, Prozac. Will try cogentin   10:44 PM  CT negative. Labs all unremarkable. Patient is tachypneic with respiratory rate in the 30s, still continues to have some tremor in her legs and feels uneasy. Will add Benadryl. Will get ABG.  ABG showing pH of 7.506, question hyperventilation. No other acute findings on labs. No explanation for SOB or leg restlessness. Question dyskanesia? Will advise to take benadryl. Follow up with family doctor or psychiatry to discuss possible medication side effect. Will start on cogentin  Vitals:   05/15/17 2012 05/15/17 2100 05/15/17 2130 05/16/17 0007  BP: (!) 160/93  (!) 178/80 (!) 196/83  Pulse: 63 60  62 62  Resp: (!) 27 (!) 31 (!) 27 (!) 22  Temp:      TempSrc:      SpO2: 100% 100% 100% 100%   At time of DC, noted BP to be elevated 196/83. Pt used to take BPmeds, but was taking off of them. BP upon arrival 403F systolic. Will start on norvasc 5mg  after discussing with Dr. Vallery Ridge. Instructed to follow up closely with PCP for recheck.   Final Clinical Impressions(s) / ED Diagnoses   Final diagnoses:  Shortness of breath  Motor restlessness  Bilateral leg pain    New Prescriptions Discharge Medication List as of 05/15/2017 11:53 PM    START taking these medications   Details  benztropine (COGENTIN) 1 MG tablet Take 1 tablet (1 mg total) by mouth 2 (two) times daily., Starting Sun 05/15/2017, Print         Calvin Chura, [place]ahoma Rocker, PA-C 05/18/17 1740    [name], MD 05/29/17 [id number]

## 2017-05-15 NOTE — Discharge Instructions (Signed)
We suspect the cause of your symptoms is medication reaction, also known as tardive dyskinesia. Please take Congentin as prescribed.  You can also take benadryl 25mg  every 6 hrs for symptoms. Follow up with family doctor and psychiatrist to discuss medications changes. Return if worsening.

## 2017-05-15 NOTE — ED Provider Notes (Signed)
Medical screening examination/treatment/procedure(s) were conducted as a shared visit with non-physician practitioner(s) and myself.  I personally evaluated the patient during the encounter.   EKG Interpretation None     Patient presenting with a complaint of swelling around her lower jaw line and tongue. She states this has been going on for a number of months. She reports it makes her breathing difficult. Patient however can eat and swallow appropriately. She reports she did eat at [employer] earlier today and was able to complete her meal without pain or choking. She also notes that her legs hurt. They always feel very twitchy and that they move a lot. She reports pain on the backs of the thighs. On examination the patient is alert and appropriate. She does not have any respiratory distress. She is holding her mouth in a closed and pursed fashion and breathing through her nose. Her airway however is completely patent. Nares are patent and patient is able to open her mouth appropriately, no trismus. With tongue depressor I can visualize a posterior oropharynx which shows no evidence of obstruction or displacement, erythema or swelling. No angioedema. I do not perceive significant tongue swelling. Her neck is supple. Questionable enlargement of the left submandibular gland. No stridor. Patient is making repetitive motions of the lips and jowls. She also has persistent twitching movements of her legs. Patient's lungs are clear without wheezes rhonchi rale. Her heart is regular. Saturation is 100% and heart rate is regular on the monitor in the 60s. Patient has already had extensive consultation and referrals. Note from pulmonology was reviewed. Patient had a CT PE study within the past couple of weeks to evaluate for symptoms. We also proceeded with CT for soft tissue neck which is normal. On examination, the symptoms are highly suggestive of a tardive dyskinesia. Patient reports she can feel her  tongue rolling around in her mouth like her feet and legs are. I most suspect Seroquel as the etiology. At this time I will have patient discontinue the Seroquel and try several days of Cogentin. She also is going to schedule follow-up with neurology and her provider regarding these symptoms and obtain second opinions. The patient's brother and sister are very concerned about the symptoms. I have spent significant time describing the diagnostic studies that have already been done, lab work and my observations. At this time, patient is clinically well with a stable airway, no signs of hypoxia and fairly typical repetitive movements of her legs and facial muscles. Follow-up plan is discussed including calling neurology in the morning as well.   [name], MD 05/16/17 [phone]

## 2017-05-15 NOTE — ED Notes (Signed)
Pt's spasms in legs have improved.  Pt ambulatory in hallway.

## 2017-05-15 NOTE — ED Notes (Signed)
Respiratory therapist made aware of ABG order.

## 2017-05-16 ENCOUNTER — Ambulatory Visit (INDEPENDENT_AMBULATORY_CARE_PROVIDER_SITE_OTHER): Payer: Medicare HMO | Admitting: Nurse Practitioner

## 2017-05-16 ENCOUNTER — Encounter: Payer: Self-pay | Admitting: Nurse Practitioner

## 2017-05-16 VITALS — BP 128/76 | HR 73 | Temp 98.2°F | Resp 20 | Ht 69.0 in | Wt 237.8 lb

## 2017-05-16 DIAGNOSIS — R2681 Unsteadiness on feet: Secondary | ICD-10-CM

## 2017-05-16 DIAGNOSIS — M79652 Pain in left thigh: Secondary | ICD-10-CM | POA: Diagnosis not present

## 2017-05-16 DIAGNOSIS — G2401 Drug induced subacute dyskinesia: Secondary | ICD-10-CM

## 2017-05-16 DIAGNOSIS — I10 Essential (primary) hypertension: Secondary | ICD-10-CM | POA: Diagnosis not present

## 2017-05-16 DIAGNOSIS — R69 Illness, unspecified: Secondary | ICD-10-CM | POA: Diagnosis not present

## 2017-05-16 DIAGNOSIS — F319 Bipolar disorder, unspecified: Secondary | ICD-10-CM

## 2017-05-16 DIAGNOSIS — R0602 Shortness of breath: Secondary | ICD-10-CM

## 2017-05-16 DIAGNOSIS — M79651 Pain in right thigh: Secondary | ICD-10-CM | POA: Diagnosis not present

## 2017-05-16 DIAGNOSIS — Z79899 Other long term (current) drug therapy: Secondary | ICD-10-CM | POA: Diagnosis not present

## 2017-05-16 MED ORDER — AMLODIPINE BESYLATE 5 MG PO TABS
5.0000 mg | ORAL_TABLET | Freq: Once | ORAL | Status: AC
  Administered 2017-05-16: 5 mg via ORAL
  Filled 2017-05-16: qty 1

## 2017-05-16 MED ORDER — AMLODIPINE BESYLATE 5 MG PO TABS: 5.0000 mg | ORAL_TABLET | Freq: Every day | ORAL | 0 refills | Status: DC

## 2017-05-16 NOTE — Progress Notes (Signed)
Careteam: Patient Care Team: [name], DO as PCP - General (Internal Medicine)  Advanced Directive information Does Patient Have a Medical Advance Directive?: No  No Known Allergies  Chief Complaint  Patient presents with  . Follow-up    Pt is being seen for an ED follow up on 05/15/17 due to SOB and bilateral leg pain.   . Other    Sister, Reino Bellis, in room     HPI: Patient is a 71 y.o. female seen in the office today to follow up ED visit.  Pt was seen in ED due to shortness of breath and felt like her jaw and tongue may have been swelling (which have been going on for 4 months). Felt like it was worse yesterday. Airway was patient and without obvious swelling in ED. VS, CT neck and chest xray unremarkable. She also reported twitching which was felt to be related to Seroquel (ED stated  suggestive of a tardive dyskinesia) and she was started on cogentin.  Here today with sister, reports ED suggested neurology referral. Here today for a referral.  Also lot of questions regarding medications, unsure abt blood  pressure medication- apparently norvasc was started due to blood pressure being elevated in the ED.   Reports last night the twitching was worse, also with pain in the back of her bilateral thigh into her knee. Now having difficulty walking and dragging right foot, been going on for 3-4 months as well. Getting worse.  Denies back pain.  Denies recent episode of incontinence of bowel or bladder- has had this happen in the past Unsteady on her feet. Sister staying with her a lot of time but works 12 hour shifts as a Neurosurgeon.  Review of Systems:  Review of Systems  Constitutional: Negative for chills, fever and weight loss.  HENT: Negative for tinnitus.   Respiratory: Positive for shortness of breath. Negative for cough and sputum production.   Cardiovascular: Negative for chest pain, palpitations and leg swelling.  Gastrointestinal: Negative for abdominal pain,  constipation, diarrhea and heartburn.  Genitourinary: Negative for dysuria, frequency and urgency.  Musculoskeletal: Positive for joint pain and myalgias. Negative for back pain and falls.  Skin: Negative.   Neurological: Positive for tremors and weakness. Negative for dizziness and headaches.  Psychiatric/Behavioral:       Bipolar     Past Medical History:  Diagnosis Date  . Allergic rhinitis   . Anemia   . Anxiety   . Arthritis   . Bipolar 1 disorder ([place])   . Depression   . Diverticulosis of colon (without mention of hemorrhage) 08/25/2011   [name]  . 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 Hyperlipidemia   . Hypertension   . Obesity   . Vertigo    Past Surgical History:  Procedure Laterality Date  . COLONOSCOPY  08/25/2011   Procedure: COLONOSCOPY;  Surgeon: Landry Dyke, MD;  Location: [facility];  Service: Endoscopy;  Laterality: N/A;  . DILATION AND CURETTAGE OF UTERUS  1960's   Social History:   reports that she has never smoked. She has never used smokeless tobacco. She reports that she does not drink alcohol or use drugs.  Family History  Problem Relation Age of Onset  . Prostate cancer Brother   . Hypertension Brother   . Kidney disease Brother   . Colon cancer Brother   . Hypertension Mother   . Kidney disease Mother   . Stomach cancer  Father   . Hyperlipidemia Sister   . Hypothyroidism Sister   . Stroke Brother   . Prostate cancer Brother     Medications: Patient's Medications  New Prescriptions   No medications on file  Previous Medications   AMLODIPINE (NORVASC) 5 MG TABLET    Take 1 tablet (5 mg total) by mouth daily.   BENZTROPINE (COGENTIN) 1 MG TABLET    Take 1 tablet (1 mg total) by mouth 2 (two) times daily.   BUPROPION (WELLBUTRIN XL) 150 MG 24 HR TABLET    Take 150 mg by mouth every morning. Take one tablet every morning   FAMOTIDINE (PEPCID) 20 MG TABLET    One at bedtime   FLUOXETINE HCL 60 MG TABS    Take 60 mg by mouth  every morning.   LATUDA 120 MG TABS    Take 120 mg by mouth at bedtime. With 350 calories   LINACLOTIDE (LINZESS) 145 MCG CAPS CAPSULE    Take 145 mcg by mouth daily as needed (constipation).   MENTHOL, TOPICAL ANALGESIC, (BIOFREEZE) 4 % GEL    Apply 1 application topically 2 (two) times daily as needed (pain).   PANTOPRAZOLE (PROTONIX) 40 MG TABLET    Take 1 tablet (40 mg total) by mouth daily. Take 30-60 min before first meal of the day   QUETIAPINE (SEROQUEL) 300 MG TABLET    Take 300 mg by mouth at bedtime.  Modified Medications   No medications on file  Discontinued Medications   No medications on file     Physical Exam:  Vitals:   05/16/17 1030  BP: 128/76  Pulse: 73  Resp: 20  Temp: 98.2 F (36.8 C)  TempSrc: Oral  SpO2: 99%  Weight: 237 lb 12.8 oz (107.9 kg)  Height: 5\' 9"  (1.753 m)   Body mass index is 35.12 kg/m.  Physical Exam  Constitutional: She is oriented to person, place, and time. She appears well-developed and well-nourished. No distress.  HENT:  Head: Normocephalic and atraumatic.  Right Ear: External ear normal.  Left Ear: External ear normal.  Mouth/Throat: Oropharynx is clear and moist. No oropharyngeal exudate.  MMM; no oral thrush  Eyes: Pupils are equal, round, and reactive to light. EOM are normal.  Neck: Normal range of motion. Neck supple. Carotid bruit is not present. No tracheal deviation present. No thyromegaly present.  Cardiovascular: Normal rate, regular rhythm and normal heart sounds.   No LE edema b/l. no calf TTP.   Pulmonary/Chest: Effort normal and breath sounds normal. No stridor. No respiratory distress. She has no wheezes. She has no rhonchi. She has no rales. She exhibits no tenderness. Right breast exhibits no inverted nipple, no mass, no nipple discharge, no skin change and no tenderness. Left breast exhibits no inverted nipple, no mass, no nipple discharge, no skin change and no tenderness. Breasts are symmetrical.  Abdominal:  Soft. Bowel sounds are normal. She exhibits no distension and no mass. There is no hepatosplenomegaly or hepatomegaly. There is no tenderness. There is no rebound and no guarding. No hernia.  Musculoskeletal: She exhibits edema (right knee medial swelling with TTP) and tenderness. She exhibits no deformity.  Lymphadenopathy:    She has no cervical adenopathy.  Neurological: She is alert and oriented to person, place, and time. She has normal reflexes.  Skin: Skin is warm and dry. No rash noted.  Psychiatric: Her affect is blunt.  Vitals reviewed.   Labs reviewed: Basic Metabolic Panel:  Recent Labs  07/20/16 1434  03/25/17 1550 05/15/17 2019 05/15/17 2021  NA 140 139 143  --   K 3.8 3.7 3.5  --   CL 105 105 108  --   CO2 25 21 26   --   GLUCOSE 90 99 105*  --   BUN 15 17 14   --   CREATININE 1.10* 1.25* 0.96  --   CALCIUM 9.5 9.6 9.4  --   TSH  --  0.93  --  0.659   Liver Function Tests:  Recent Labs  07/20/16 1434 03/25/17 1550 05/15/17 2019  AST 19 18 44*  ALT 17 15 31   ALKPHOS 80 91 73  BILITOT 0.4 0.3 0.4  PROT 6.8 7.0 7.0  ALBUMIN 3.8 4.2 3.9   No results for input(s): LIPASE, AMYLASE in the last 8760 hours. No results for input(s): AMMONIA in the last 8760 hours. CBC:  Recent Labs  07/20/16 1434 03/25/17 1550 05/15/17 2019  WBC 5.9 7.8 8.6  NEUTROABS 3,304 4,992 4.9  HGB 12.8 13.6 12.6  HCT 40.0 40.8 36.8  MCV 90.5 89.1 86.6  PLT 293 300 260   Lipid Panel:  Recent Labs  07/20/16 1434 03/25/17 1550  CHOL 198 201*  HDL 61 60  LDLCALC 117 119*  TRIG 98 108  CHOLHDL 3.2 3.4   TSH:  Recent Labs  03/25/17 1550 05/15/17 2021  TSH 0.93 0.659   A1C: Lab Results  Component Value Date   HGBA1C 5.7 (H) 03/25/2017     Assessment/Plan 1. Tardive dyskinesia -thought to be due to Seroquel which has now been stopped. Placed on cogentin at this time - Ambulatory referral to Neurology for further evaluation  - Ambulatory referral to [facility]  2. Bipolar 1 disorder ([place]) To follow up with psychiatrist due to medication changes  3. Shortness of breath Stable, lungs clear and O2 sats stable, reviewed ED visit.   4. Unsteady gait - Ambulatory referral to [place]  Next appt: to keep scheduled follo w up with Dr Eulas Post.  Carlos American. Harle Battiest  [facility] [phone] 8 am - 5 pm) [phone] (after hours)

## 2017-05-16 NOTE — Patient Instructions (Signed)
Medication list is up to date  To follow up with psychiatrist  Neurology referral placed

## 2017-05-19 ENCOUNTER — Telehealth: Payer: Self-pay

## 2017-05-19 DIAGNOSIS — M5441 Lumbago with sciatica, right side: Secondary | ICD-10-CM | POA: Diagnosis not present

## 2017-05-19 DIAGNOSIS — M5442 Lumbago with sciatica, left side: Secondary | ICD-10-CM | POA: Diagnosis not present

## 2017-05-19 NOTE — Telephone Encounter (Signed)
Judy [name] called on 05/18/16 to ask about the neurology referral that was supposed to be placed at patient's last OV. At the time of the call, the office computer system was down and I was unable to give a response at that time.   I called Judy [name] this morning to let her know that the referral was placed and sent to [facility]. That office should be calling to schedule patient for an appointment. I asked that Judy [name] call the office if she has any questions.

## 2017-05-24 ENCOUNTER — Observation Stay (HOSPITAL_COMMUNITY)
Admission: EM | Admit: 2017-05-24 | Discharge: 2017-05-27 | Disposition: A | Discharge status: Home / Self Care | Payer: Medicare HMO | Attending: Internal Medicine | Admitting: Internal Medicine

## 2017-05-24 ENCOUNTER — Emergency Department (HOSPITAL_COMMUNITY): Payer: Medicare HMO

## 2017-05-24 ENCOUNTER — Telehealth: Payer: Self-pay | Admitting: *Deleted

## 2017-05-24 ENCOUNTER — Encounter (HOSPITAL_COMMUNITY): Payer: Self-pay

## 2017-05-24 DIAGNOSIS — R296 Repeated falls: Secondary | ICD-10-CM | POA: Insufficient documentation

## 2017-05-24 DIAGNOSIS — E7800 Pure hypercholesterolemia, unspecified: Secondary | ICD-10-CM | POA: Insufficient documentation

## 2017-05-24 DIAGNOSIS — M40204 Unspecified kyphosis, thoracic region: Secondary | ICD-10-CM | POA: Diagnosis not present

## 2017-05-24 DIAGNOSIS — W19XXXA Unspecified fall, initial encounter: Secondary | ICD-10-CM | POA: Diagnosis not present

## 2017-05-24 DIAGNOSIS — I7389 Other specified peripheral vascular diseases: Secondary | ICD-10-CM | POA: Insufficient documentation

## 2017-05-24 DIAGNOSIS — R410 Disorientation, unspecified: Secondary | ICD-10-CM | POA: Insufficient documentation

## 2017-05-24 DIAGNOSIS — M4807 Spinal stenosis, lumbosacral region: Secondary | ICD-10-CM | POA: Diagnosis not present

## 2017-05-24 DIAGNOSIS — K589 Irritable bowel syndrome without diarrhea: Secondary | ICD-10-CM | POA: Diagnosis not present

## 2017-05-24 DIAGNOSIS — R0609 Other forms of dyspnea: Secondary | ICD-10-CM | POA: Diagnosis present

## 2017-05-24 DIAGNOSIS — R1310 Dysphagia, unspecified: Secondary | ICD-10-CM

## 2017-05-24 DIAGNOSIS — M1990 Unspecified osteoarthritis, unspecified site: Secondary | ICD-10-CM | POA: Insufficient documentation

## 2017-05-24 DIAGNOSIS — R531 Weakness: Secondary | ICD-10-CM | POA: Insufficient documentation

## 2017-05-24 DIAGNOSIS — F319 Bipolar disorder, unspecified: Secondary | ICD-10-CM | POA: Diagnosis not present

## 2017-05-24 DIAGNOSIS — S8991XA Unspecified injury of right lower leg, initial encounter: Secondary | ICD-10-CM | POA: Diagnosis not present

## 2017-05-24 DIAGNOSIS — I1310 Hypertensive heart and chronic kidney disease without heart failure, with stage 1 through stage 4 chronic kidney disease, or unspecified chronic kidney disease: Secondary | ICD-10-CM | POA: Diagnosis not present

## 2017-05-24 DIAGNOSIS — M2578 Osteophyte, vertebrae: Secondary | ICD-10-CM | POA: Diagnosis not present

## 2017-05-24 DIAGNOSIS — H918X2 Other specified hearing loss, left ear: Secondary | ICD-10-CM | POA: Insufficient documentation

## 2017-05-24 DIAGNOSIS — R0602 Shortness of breath: Secondary | ICD-10-CM | POA: Insufficient documentation

## 2017-05-24 DIAGNOSIS — M4802 Spinal stenosis, cervical region: Secondary | ICD-10-CM | POA: Diagnosis not present

## 2017-05-24 DIAGNOSIS — G2401 Drug induced subacute dyskinesia: Principal | ICD-10-CM | POA: Diagnosis present

## 2017-05-24 DIAGNOSIS — M4316 Spondylolisthesis, lumbar region: Secondary | ICD-10-CM | POA: Diagnosis not present

## 2017-05-24 DIAGNOSIS — J45909 Unspecified asthma, uncomplicated: Secondary | ICD-10-CM | POA: Insufficient documentation

## 2017-05-24 DIAGNOSIS — F419 Anxiety disorder, unspecified: Secondary | ICD-10-CM | POA: Diagnosis not present

## 2017-05-24 DIAGNOSIS — M79604 Pain in right leg: Secondary | ICD-10-CM | POA: Diagnosis not present

## 2017-05-24 DIAGNOSIS — I10 Essential (primary) hypertension: Secondary | ICD-10-CM | POA: Diagnosis present

## 2017-05-24 DIAGNOSIS — E876 Hypokalemia: Secondary | ICD-10-CM | POA: Diagnosis present

## 2017-05-24 DIAGNOSIS — Z79899 Other long term (current) drug therapy: Secondary | ICD-10-CM | POA: Insufficient documentation

## 2017-05-24 DIAGNOSIS — N183 Chronic kidney disease, stage 3 (moderate): Secondary | ICD-10-CM | POA: Insufficient documentation

## 2017-05-24 DIAGNOSIS — Z8719 Personal history of other diseases of the digestive system: Secondary | ICD-10-CM | POA: Insufficient documentation

## 2017-05-24 DIAGNOSIS — R42 Dizziness and giddiness: Secondary | ICD-10-CM

## 2017-05-24 DIAGNOSIS — S0990XA Unspecified injury of head, initial encounter: Secondary | ICD-10-CM | POA: Diagnosis not present

## 2017-05-24 DIAGNOSIS — R0600 Dyspnea, unspecified: Secondary | ICD-10-CM | POA: Diagnosis present

## 2017-05-24 DIAGNOSIS — Y92009 Unspecified place in unspecified non-institutional (private) residence as the place of occurrence of the external cause: Secondary | ICD-10-CM | POA: Diagnosis present

## 2017-05-24 DIAGNOSIS — M48061 Spinal stenosis, lumbar region without neurogenic claudication: Secondary | ICD-10-CM | POA: Diagnosis not present

## 2017-05-24 DIAGNOSIS — R1319 Other dysphagia: Secondary | ICD-10-CM | POA: Diagnosis present

## 2017-05-24 DIAGNOSIS — M47814 Spondylosis without myelopathy or radiculopathy, thoracic region: Secondary | ICD-10-CM | POA: Insufficient documentation

## 2017-05-24 DIAGNOSIS — H9192 Unspecified hearing loss, left ear: Secondary | ICD-10-CM | POA: Diagnosis present

## 2017-05-24 DIAGNOSIS — M48062 Spinal stenosis, lumbar region with neurogenic claudication: Secondary | ICD-10-CM

## 2017-05-24 DIAGNOSIS — K5900 Constipation, unspecified: Secondary | ICD-10-CM | POA: Diagnosis present

## 2017-05-24 DIAGNOSIS — Z6834 Body mass index (BMI) 34.0-34.9, adult: Secondary | ICD-10-CM | POA: Insufficient documentation

## 2017-05-24 DIAGNOSIS — M79605 Pain in left leg: Secondary | ICD-10-CM

## 2017-05-24 LAB — COMPREHENSIVE METABOLIC PANEL
ALBUMIN: 4 g/dL (ref 3.5–5.0)
ALK PHOS: 74 U/L (ref 38–126)
ALT: 28 U/L (ref 14–54)
ANION GAP: 10 (ref 5–15)
AST: 30 U/L (ref 15–41)
BUN: 18 mg/dL (ref 6–20)
CALCIUM: 9.6 mg/dL (ref 8.9–10.3)
CHLORIDE: 103 mmol/L (ref 101–111)
CO2: 27 mmol/L (ref 22–32)
CREATININE: 0.92 mg/dL (ref 0.44–1.00)
GFR calc Af Amer: >60 mL/min (ref >60)
GFR calc non Af Amer: >60 mL/min (ref >60)
GLUCOSE: 98 mg/dL (ref 65–99)
Potassium: 3.2 mmol/L — ABNORMAL LOW (ref 3.5–5.1)
SODIUM: 140 mmol/L (ref 135–145)
TOTAL PROTEIN: 7.5 g/dL (ref 6.5–8.1)
Total Bilirubin: 0.4 mg/dL (ref 0.3–1.2)

## 2017-05-24 LAB — CBC WITH DIFFERENTIAL/PLATELET
BASOS PCT: 0 %
Basophils Absolute: 0 10*3/uL (ref 0.0–0.1)
EOS ABS: 0.1 10*3/uL (ref 0.0–0.7)
EOS PCT: 1 %
HCT: 38 % (ref 36.0–46.0)
Hemoglobin: 13.2 g/dL (ref 12.0–15.0)
Lymphocytes Relative: 28 %
Lymphs Abs: 2.6 10*3/uL (ref 0.7–4.0)
MCH: 29.8 pg (ref 26.0–34.0)
MCHC: 34.7 g/dL (ref 30.0–36.0)
MCV: 85.8 fL (ref 78.0–100.0)
Monocytes Absolute: 1 10*3/uL (ref 0.1–1.0)
Monocytes Relative: 11 %
NEUTROS PCT: 60 %
Neutro Abs: 5.5 10*3/uL (ref 1.7–7.7)
PLATELETS: 325 10*3/uL (ref 150–400)
RBC: 4.43 MIL/uL (ref 3.87–5.11)
RDW: 15.2 % (ref 11.5–15.5)
WBC: 9.3 10*3/uL (ref 4.0–10.5)

## 2017-05-24 LAB — SEDIMENTATION RATE: Sed Rate: 20 mm/hr (ref 0–22)

## 2017-05-24 LAB — I-STAT TROPONIN, ED: Troponin i, poc: 0 ng/mL (ref 0.00–0.08)

## 2017-05-24 LAB — TSH: TSH: 1.45 u[IU]/mL (ref 0.350–4.500)

## 2017-05-24 LAB — CK: CK TOTAL: 431 U/L — AB (ref 38–234)

## 2017-05-24 LAB — BRAIN NATRIURETIC PEPTIDE: B Natriuretic Peptide: 22.2 pg/mL (ref 0.0–100.0)

## 2017-05-24 MED ORDER — POTASSIUM CHLORIDE CRYS ER 20 MEQ PO TBCR
40.0000 meq | EXTENDED_RELEASE_TABLET | Freq: Once | ORAL | Status: AC
  Administered 2017-05-25: 40 meq via ORAL
  Filled 2017-05-24: qty 2

## 2017-05-24 NOTE — Telephone Encounter (Signed)
If she has had a change in condition that severe, she needs go to the ER for further eval. Otherwise, continue current therapy and follow up as scheduled or sooner if need be

## 2017-05-24 NOTE — Telephone Encounter (Signed)
Patient sister, Judy [name] walked into office concerned about patient. Stated that since her last visit her condition has worsened. Stated that she has been to Orthopaedic and received 2 injections for her hip and leg pain. No better. Patient is still falling and stumbling around. Stated her breathing is worse. Stated that patient lives alone and fell again on Sunday. Sister feels like patient needs to be admitted to the Hospital to figure out what is going on. They are still awaiting appointment to Neurologist. Orthopaedic is setting up a CT of leg/hip. Judy [name] is very concerned and stressed about patient's condition and thinks she needs to be hospitalized.  Patient is set up with [facility]. Phone number to home care given to Judy. Idolina [name].  Please Advise.

## 2017-05-24 NOTE — Telephone Encounter (Signed)
[name], sister notified and will take her to the ER.

## 2017-05-24 NOTE — Clinical Social Work Note (Signed)
Clinical Social Work Assessment  Patient Details  Name: Judy [name] MRN: [id number] Date of Birth: 02-Feb-1946  Date of referral:  05/24/17               Reason for consult:  Facility Placement, [employer]                Permission sought to share information with:  Family Supports, Chartered certified accountant granted to share information::  Yes, Verbal Permission Granted  Name::        Agency::     Relationship::     Contact Information:     Housing/Transportation Living arrangements for the past 2 months:  Single Family Home Source of Information:  Adult Children Patient Interpreter Needed:  None Criminal Activity/Legal Involvement Pertinent to Current Situation/Hospitalization:    Significant Relationships:  Adult Children Lives with:  Self Do you feel safe going back to the place where you live?  No Need for family participation in patient care:  Yes (Comment)  Care giving concerns:  Family feels pt should be admitted and EDP is attempting to get hospitalist to assess for inpatient criteria.  Family would prefer pt be admitted and placed in SNF.  CSW awaiting decision by hospitalist.    If admitted and meets criteria for SNF placement family would prefer [facility] or [facility] and would like to send referrals out to [facility] SNF's only.  Social Worker assessment / plan:  CSW met with pt and pt's family and confirmed pt's plan to be discharged to SNF to live at discharge.  CSW provided active listening and validated pt's concerns.   CSW DEP was given permission to complete FL-2 and send referrals out to [facility] facilities via the hub if PT recommends.  Pt has been living independently prior to being admitted to [facility].  Employment status:  Retired Nurse, adult PT Recommendations:  Not assessed at this time Information / Referral to community resources:     Patient/Family's Response to care:  Patient alert and oriented  but family feels pt does not fully understand without their help, the complete situation.  Patient and pt's family agreeable to plan.  Pt's family supportive and strongly involved in pt.'s care.  Pt and pt's family pleasant and appreciated CSW intervention.     Patient/Family's Understanding of and Emotional Response to Diagnosis, Current Treatment, and Prognosis:  Still assessing  Emotional Assessment Appearance:  Appears stated age Attitude/Demeanor/Rapport:    Affect (typically observed):  Accepting, Adaptable, Calm, Pleasant Orientation:  Oriented to Self, Oriented to Place, Oriented to  Time, Oriented to Situation Alcohol / Substance use:    Psych involvement (Current and /or in the community):     Discharge Needs  Concerns to be addressed:  No discharge needs identified Readmission within the last 30 days:  No Current discharge risk:  None Barriers to Discharge:  No Barriers Identified   Claudine Mouton, LCSWA 05/24/2017, 10:16 PM

## 2017-05-24 NOTE — Progress Notes (Signed)
Consult request has been received. CSW attempting to follow up at present time.  Lucinda Spells F. Mialynn Shelvin, LCSWA, LCAS, CSI Clinical Social Worker Ph: [phone]  

## 2017-05-24 NOTE — ED Provider Notes (Signed)
[facility] DEPT Provider Note   CSN: [id number] Arrival date & time: 05/24/17  1946     History   Chief Complaint Chief Complaint  Patient presents with  . Shortness of Breath    HPI Judy [name] is a 71 y.o. female.  HPI   Presents with concern for 3-4 months of bilateral upper legs and shortness of breath.  Symptoms have been progressing over the last few months and etiology is not clear based on outpatient and ED evaluations.  PCP recommended coming in for reevaluation given worsening symptoms and possible admission for work up.  Had a fall Sunday, missed the bed and hit head.  Has seemed disoriented at times since then.  Reports mild headache.  No nausea or vomiting. Reports numbness in right leg for 4 months.  Is seeing orthopedics. Shortness of breath has been worsening over months. Not worse laying down flat. Is worse with exertion. Dry cough, congestion.  No chest pain. Had CT PE study on 7/9 which was negative, had similar symptoms.  Symptoms have been worsening since then.  No ear pain.  No fever.  Family very worried regarding her worsening symptoms. "her chest is always going up and down up and down even when she is not doing anything."   Past Medical History:  Diagnosis Date  . Allergic rhinitis   . Anemia   . Anxiety   . Arthritis   . Bipolar 1 disorder ([place])   . Depression   . Diverticulosis of colon (without mention of hemorrhage) 08/25/2011   [name]  . 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 Hyperlipidemia   . Hypertension   . Obesity   . Vertigo     Patient Active Problem List   Diagnosis Date Noted  . Chronic pain of right knee 03/25/2017  . High risk medication use 03/25/2017  . Family hx of colon cancer 03/25/2017  . Occupational exposure in workplace 03/25/2017  . Depressed bipolar I disorder in partial remission ([place]) 03/25/2017  . DOE (dyspnea on exertion) 03/25/2017  . Routine general medical examination at a health care facility  06/04/2014  . Hyperlipidemia with target LDL less than 130 03/20/2014  . Essential hypertension, benign 03/20/2014  . Unspecified constipation 03/20/2014  . Light headedness 03/20/2014  . Acute on chronic kidney disease, stage 3 03/20/2014  . Hypokalemia 03/20/2014  . Encounter for therapeutic drug monitoring 03/20/2014  . Other dysphagia 12/26/2013  . GERD (gastroesophageal reflux disease) 12/21/2013  . Constipation 12/21/2013  . Syncope 12/10/2013  . Leukocytosis 12/10/2013  . URI (upper respiratory infection) 12/10/2013  . Bipolar 1 disorder ([place])   . Morbid obesity due to excess calories ([place])   . Hearing loss in left ear   . Vertigo   . HYPERCHOLESTEROLEMIA 10/08/2010  . DEPRESSION 10/08/2010  . HYPERTENSION 10/08/2010  . ALLERGIC RHINITIS 10/08/2010        Past Surgical History:  Procedure Laterality Date  . COLONOSCOPY  08/25/2011   Procedure: COLONOSCOPY;  Surgeon: Landry Dyke, MD;  Location: [facility];  Service: Endoscopy;  Laterality: N/A;  . DILATION AND CURETTAGE OF UTERUS  1960's    OB History    No data available       Home Medications    Prior to Admission medications   Medication Sig Start Date End Date Taking? Authorizing Provider  acetaminophen (TYLENOL) 500 MG tablet Take 500 mg by mouth daily as needed for moderate pain.   Yes [provider]  amLODipine (NORVASC) 5 MG tablet Take 1 tablet (5 mg total) by mouth daily. 05/16/17  Yes Kirichenko, Tatyana, PA-C  benztropine (COGENTIN) 1 MG tablet Take 1 tablet (1 mg total) by mouth 2 (two) times daily. 05/15/17  Yes Kirichenko, Tatyana, PA-C  buPROPion (WELLBUTRIN XL) 150 MG 24 hr tablet Take 150 mg by mouth every morning. Take one tablet every morning 02/28/17  Yes [provider]  famotidine (PEPCID) 20 MG tablet One at bedtime 04/15/17  Yes Tanda Rockers, MD  FLUoxetine HCl 60 MG TABS Take 60 mg by mouth every morning. 08/03/16  Yes [provider]  LATUDA 120 MG TABS  Take 120 mg by mouth at bedtime. With 350 calories 07/31/16  Yes [provider]  linaclotide (LINZESS) 145 MCG CAPS capsule Take 145 mcg by mouth daily as needed (constipation).   Yes [provider]  Menthol, Topical Analgesic, (BIOFREEZE) 4 % GEL Apply 1 application topically 2 (two) times daily as needed (pain).   Yes [provider]  naproxen sodium (ANAPROX) 220 MG tablet Take 440 mg by mouth daily as needed.   Yes [provider]  pantoprazole (PROTONIX) 40 MG tablet Take 1 tablet (40 mg total) by mouth daily. Take 30-60 min before first meal of the day 04/15/17  Yes Tanda Rockers, MD    Family History Family History  Problem Relation Age of Onset  . Prostate cancer Brother   . Hypertension Brother   . Kidney disease Brother   . Colon cancer Brother   . Hypertension Mother   . Kidney disease Mother   . Stomach cancer Father   . Hyperlipidemia Sister   . Hypothyroidism Sister   . Stroke Brother   . Prostate cancer Brother     Social History Social History  Substance Use Topics  . Smoking status: Never Smoker  . Smokeless tobacco: Never Used  . Alcohol use No     Allergies   Patient has no known allergies.   Review of Systems Review of Systems  Constitutional: Negative for fever.  HENT: Negative for sore throat.   Eyes: Negative for visual disturbance.  Respiratory: Positive for cough and shortness of breath.   Cardiovascular: Negative for chest pain.  Gastrointestinal: Negative for abdominal pain, nausea and vomiting.  Genitourinary: Negative for difficulty urinating.  Musculoskeletal: Positive for myalgias. Negative for back pain and neck pain.  Skin: Negative for rash.  Neurological: Positive for numbness (right leg). Negative for syncope and headaches.     Physical Exam Updated Vital Signs BP (!) 148/76   Pulse 62   Temp 98.4 F (36.9 C) (Oral)   Resp (!) 21   SpO2 98%   Physical Exam  Constitutional: She is  oriented to person, place, and time. She appears well-developed and well-nourished. No distress.  HENT:  Head: Normocephalic and atraumatic.  Eyes: Pupils are equal, round, and reactive to light. Conjunctivae and EOM are normal.  Neck: Normal range of motion.  Cardiovascular: Normal rate, regular rhythm, normal heart sounds and intact distal pulses.  Exam reveals no gallop and no friction rub.   No murmur heard. Pulmonary/Chest: Effort normal and breath sounds normal. No respiratory distress. She has no wheezes. She has no rales.  Abdominal: Soft. She exhibits no distension. There is no tenderness. There is no guarding.  Musculoskeletal: She exhibits no edema or tenderness.  Knee abrasion Pain with movement of legs bilaterally, right worse than left, normal ROM   Lymphadenopathy:  She has cervical adenopathy.  Neurological: She is alert and oriented to person, place, and time. GCS eye subscore is 4. GCS verbal subscore is 5. GCS motor subscore is 6.  Strength limited by pain, however appears to be 5/5 bilaterally Reflexes difficult to obtain bilaterally  Skin: Skin is warm and dry. No rash noted. She is not diaphoretic. No erythema.  Nursing note and vitals reviewed.    ED Treatments / Results  Labs (all labs ordered are listed, but only abnormal results are displayed) Labs Reviewed  CK - Abnormal; Notable for the following:       Result Value   Total CK 431 (*)    All other components within normal limits  COMPREHENSIVE METABOLIC PANEL - Abnormal; Notable for the following:    Potassium 3.2 (*)    All other components within normal limits  SEDIMENTATION RATE  CBC WITH DIFFERENTIAL/PLATELET  BRAIN NATRIURETIC PEPTIDE  TSH  I-STAT TROPONIN, ED    EKG  EKG Interpretation  Date/Time:  Tuesday May 24 2017 20:25:19 EDT Ventricular Rate:  67 PR Interval:    QRS Duration: 88 QT Interval:  423 QTC Calculation: 447 R Axis:   5 Text Interpretation:  Sinus rhythm  Borderline short PR interval Anteroseptal infarct, old No significant change since last tracing Confirmed by Gareth Morgan [phone]) on 05/24/2017 8:44:18 PM       Radiology Dg Chest 2 View  Result Date: 05/24/2017 CLINICAL DATA:  Pt states being short of breath and falling earlier today, pt states both thighs are sore due to injections. Pt states she has had SOB since Sunday. Pt has no other chest complaints at this time. Pt HX: asthma, HTN EXAM: CHEST  2 VIEW COMPARISON:  05/15/2017 FINDINGS: Tortuous thoracic aorta. Mild enlargement of the cardiopericardial silhouette, without edema. Lungs appear clear. Thoracic spondylosis and kyphosis. No pleural effusion. IMPRESSION: 1. Mild cardiomegaly, without edema.  Tortuous thoracic aorta. 2. Thoracic kyphosis and spondylosis. Electronically Signed   By: Van Clines M.D.   On: 05/24/2017 21:15   Dg Knee 2 Views Right  Result Date: 05/24/2017 CLINICAL DATA:  [place] 1 month ago.  Persistent weakness. EXAM: RIGHT KNEE - 1-2 VIEW COMPARISON:  RIGHT knee radiograph July 23, 2016 FINDINGS: No acute fracture deformity or dislocation. Similar tibial spine peaking. Mild-to-moderate patellofemoral compartment narrowing with periarticular sclerosis and marginal spurring compatible with osteoarthrosis. Mild mediolateral compartment marginal spurring. Similar calcification along the medial femoral epicondyles seen with old ligamentous injury. No destructive bony lesions. Soft tissue planes are nonsuspicious. IMPRESSION: Stable degenerative change of the knee without acute osseous process. Electronically Signed   By: Elon Alas M.D.   On: 05/24/2017 23:49   Ct Head Wo Contrast  Result Date: 05/24/2017 CLINICAL DATA:  Status post fall, with progressive right leg weakness, acute onset. Initial encounter. EXAM: CT HEAD WITHOUT CONTRAST TECHNIQUE: Contiguous axial images were obtained from the base of the skull through the vertex without intravenous  contrast. COMPARISON:  CT of the head performed 12/10/2013, and MRI of the brain performed 03/06/2014 FINDINGS: Brain: No evidence of acute infarction, hemorrhage, hydrocephalus, extra-axial collection or mass lesion/mass effect. Scattered periventricular and subcortical white matter change likely reflects small vessel ischemic microangiopathy. The posterior fossa, including the cerebellum, brainstem and fourth ventricle, is within normal limits. The third and lateral ventricles, and basal ganglia are unremarkable in appearance. The cerebral hemispheres are symmetric in appearance, with normal gray-white differentiation. No mass effect or midline shift is seen. Vascular: No hyperdense vessel  or unexpected calcification. Skull: There is no evidence of fracture; visualized osseous structures are unremarkable in appearance. Sinuses/Orbits: The orbits are within normal limits. The paranasal sinuses and mastoid air cells are well-aerated. Other: No significant soft tissue abnormalities are seen. IMPRESSION: 1. No evidence of traumatic intracranial injury or fracture. 2. Scattered small vessel ischemic microangiopathy. Electronically Signed   By: Garald Balding M.D.   On: 05/24/2017 22:43    Procedures Procedures (including critical care time)  Medications Ordered in ED Medications  potassium chloride SA (K-DUR,KLOR-CON) CR tablet 40 mEq (40 mEq Oral Given 05/25/17 0026)     Initial Impression / Assessment and Plan / ED Course  I have reviewed the triage vital signs and the nursing notes.  Pertinent labs & imaging results that were available during my care of the patient were reviewed by me and considered in my medical decision making (see chart for details).     71 year old female with a history of hypertension, bipolar, hyperlipidemia, with history of dyspnea and bilateral leg pain for which she has undergone evaluation with pulmonology, in ED, with PCP, and orthopedics presents with progression of  symptoms, concern for fall and increasing dyspnea.  Chest x-ray shows cardiac megaly without signs of pulmonary edema. BNP is within normal limits. Patient had a CT angiogram done in July for similar symptoms which showed no sign of pulmonary embolus. No significant anemia. Patient had a CT neck done which also did not show any sign of obstruction.  She does show some mild tachypnea on exam.  Regarding her fall, head CT was done given history of head trauma with episodes of disorientation since then, which showed no acute abnormalities. X-ray of the knee shows no acute abnormalities. No other signs of traumatic injury.  Regarding dyspnea, she is began a thorough outpatient workup. She did see cardiology, and had a stress test which was concerning for inducible ischemia, but was felt to be possible artifact. Initially was recommended that she do a cardiac CT for further evaluation, but her insurance did not cover it, and on further evaluation, cardiology did not want to pursue further testing or catheterization in March. Since that time, her dyspnea on exertion and at rest has progressed significantly. Her family is concerned regarding these worsening symptoms. I felt reasonable to observe her in the hospital, consider inpatient echo, and possible other stress test vs cardiac CT if felt indicated.  Regarding her leg pain, she has good bilateral pulses, low suspicion for acute arterial occlusion. No asymmetric swelling and doubt DVT. No sign of septic arthritis. CK has mild elevation, but do sign of significant rhabdomyolysis. Her ESR is within normal limits and have low suspicion for polymyalgia rheumatica.  Overall suspect her leg pain in the back of both legs is radicular pain coming from her back. It sounds like her orthopedic physician who felt the same. Discussed that this wouldn't require more outpatient follow-up and evaluation, and she did not have signs of surgical emergency.  Feel she would benefit from  PT.  Given worsening symptoms, falls, need for possible continued cardiac eval, will admit to observation.   Final Clinical Impressions(s) / ED Diagnoses   Final diagnoses:  Dyspnea, unspecified type  Fall, initial encounter  Bilateral leg pain  Dyspnea on exertion    New Prescriptions New Prescriptions   No medications on file     Gareth Morgan, MD 05/25/17 [id number]

## 2017-05-24 NOTE — ED Triage Notes (Signed)
Pt complains of being short of breath and falling earlier today, pt states both thighs are sore  Pt was seen last week for the same and her primary doctor told her come here for further evaluation and possible admission

## 2017-05-24 NOTE — ED Notes (Signed)
This RN spoke to Hurshel Party, MD from [place] regarding pt. Dr. Claris Pong is not familiar with pt case as she is the back up physician on call. Her number is [phone] if EDP has any questions for her.

## 2017-05-25 ENCOUNTER — Observation Stay (HOSPITAL_COMMUNITY): Payer: Medicare HMO

## 2017-05-25 DIAGNOSIS — M79605 Pain in left leg: Secondary | ICD-10-CM

## 2017-05-25 DIAGNOSIS — M79604 Pain in right leg: Secondary | ICD-10-CM

## 2017-05-25 DIAGNOSIS — R0600 Dyspnea, unspecified: Secondary | ICD-10-CM | POA: Diagnosis present

## 2017-05-25 DIAGNOSIS — R0609 Other forms of dyspnea: Secondary | ICD-10-CM

## 2017-05-25 DIAGNOSIS — G2401 Drug induced subacute dyskinesia: Secondary | ICD-10-CM | POA: Diagnosis not present

## 2017-05-25 DIAGNOSIS — R69 Illness, unspecified: Secondary | ICD-10-CM | POA: Diagnosis not present

## 2017-05-25 DIAGNOSIS — I10 Essential (primary) hypertension: Secondary | ICD-10-CM | POA: Diagnosis not present

## 2017-05-25 DIAGNOSIS — W19XXXA Unspecified fall, initial encounter: Secondary | ICD-10-CM | POA: Diagnosis present

## 2017-05-25 DIAGNOSIS — F319 Bipolar disorder, unspecified: Secondary | ICD-10-CM | POA: Diagnosis not present

## 2017-05-25 DIAGNOSIS — S0990XA Unspecified injury of head, initial encounter: Secondary | ICD-10-CM | POA: Diagnosis not present

## 2017-05-25 DIAGNOSIS — R1310 Dysphagia, unspecified: Secondary | ICD-10-CM | POA: Diagnosis not present

## 2017-05-25 DIAGNOSIS — M48061 Spinal stenosis, lumbar region without neurogenic claudication: Secondary | ICD-10-CM | POA: Diagnosis not present

## 2017-05-25 DIAGNOSIS — M5126 Other intervertebral disc displacement, lumbar region: Secondary | ICD-10-CM | POA: Diagnosis not present

## 2017-05-25 MED ORDER — AMLODIPINE BESYLATE 5 MG PO TABS
5.0000 mg | ORAL_TABLET | Freq: Every day | ORAL | Status: DC
  Administered 2017-05-25 – 2017-05-27 (×3): 5 mg via ORAL
  Filled 2017-05-25 (×3): qty 1

## 2017-05-25 MED ORDER — PANTOPRAZOLE SODIUM 40 MG PO TBEC
40.0000 mg | DELAYED_RELEASE_TABLET | Freq: Every day | ORAL | Status: DC
  Administered 2017-05-25 – 2017-05-27 (×3): 40 mg via ORAL
  Filled 2017-05-25 (×3): qty 1

## 2017-05-25 MED ORDER — BUPROPION HCL ER (XL) 150 MG PO TB24
150.0000 mg | ORAL_TABLET | Freq: Every morning | ORAL | Status: DC
  Administered 2017-05-25 – 2017-05-27 (×3): 150 mg via ORAL
  Filled 2017-05-25 (×3): qty 1

## 2017-05-25 MED ORDER — ONDANSETRON HCL 4 MG/2ML IJ SOLN
4.0000 mg | Freq: Four times a day (QID) | INTRAMUSCULAR | Status: DC | PRN
  Filled 2017-05-25: qty 2

## 2017-05-25 MED ORDER — ONDANSETRON HCL 4 MG PO TABS: 4.0000 mg | ORAL_TABLET | Freq: Four times a day (QID) | ORAL | Status: DC | PRN

## 2017-05-25 MED ORDER — LINACLOTIDE 145 MCG PO CAPS
145.0000 ug | ORAL_CAPSULE | Freq: Every day | ORAL | Status: DC | PRN
  Filled 2017-05-25: qty 1

## 2017-05-25 MED ORDER — ACETAMINOPHEN 325 MG PO TABS
650.0000 mg | ORAL_TABLET | Freq: Four times a day (QID) | ORAL | Status: DC | PRN
  Administered 2017-05-25 – 2017-05-26 (×4): 650 mg via ORAL
  Filled 2017-05-25 (×4): qty 2

## 2017-05-25 MED ORDER — ENOXAPARIN SODIUM 40 MG/0.4ML ~~LOC~~ SOLN
40.0000 mg | SUBCUTANEOUS | Status: DC
  Administered 2017-05-25 – 2017-05-27 (×3): 40 mg via SUBCUTANEOUS
  Filled 2017-05-25 (×3): qty 0.4

## 2017-05-25 MED ORDER — BISACODYL 10 MG RE SUPP: 10.0000 mg | Freq: Every day | RECTAL | Status: DC | PRN

## 2017-05-25 MED ORDER — FLUOXETINE HCL 20 MG PO CAPS
60.0000 mg | ORAL_CAPSULE | Freq: Every morning | ORAL | Status: DC
  Administered 2017-05-25 – 2017-05-27 (×3): 60 mg via ORAL
  Filled 2017-05-25 (×3): qty 3

## 2017-05-25 MED ORDER — LURASIDONE HCL 40 MG PO TABS
120.0000 mg | ORAL_TABLET | Freq: Every day | ORAL | Status: DC
  Administered 2017-05-25 – 2017-05-26 (×2): 120 mg via ORAL
  Filled 2017-05-25 (×3): qty 3

## 2017-05-25 MED ORDER — SODIUM CHLORIDE 0.45 % IV SOLN
INTRAVENOUS | Status: DC
  Administered 2017-05-25 (×2): via INTRAVENOUS
  Administered 2017-05-26: 500 mL via INTRAVENOUS

## 2017-05-25 MED ORDER — BENZTROPINE MESYLATE 0.5 MG PO TABS
1.0000 mg | ORAL_TABLET | Freq: Two times a day (BID) | ORAL | Status: DC
  Administered 2017-05-25 – 2017-05-27 (×5): 1 mg via ORAL
  Filled 2017-05-25 (×2): qty 2
  Filled 2017-05-25: qty 1
  Filled 2017-05-25 (×3): qty 2
  Filled 2017-05-25: qty 1

## 2017-05-25 MED ORDER — GADOBENATE DIMEGLUMINE 529 MG/ML IV SOLN
20.0000 mL | Freq: Once | INTRAVENOUS | Status: AC | PRN
  Administered 2017-05-25: 20 mL via INTRAVENOUS

## 2017-05-25 NOTE — Progress Notes (Signed)
CM consult for multiple falls. This CM contacted MD to ask appropriateness of PT eval. Will await PT eval and assist with disposition planning as needed. Marney Doctor RN,BSN,NCM [phone]

## 2017-05-25 NOTE — Progress Notes (Signed)
   05/25/17 1300  Clinical Encounter Type  Visited With Patient and family together  Visit Type Initial;Psychological support;Spiritual support  Referral From Nurse  Consult/Referral To Chaplain  Spiritual Encounters  Spiritual Needs Emotional;Other (Comment);[employer] researcher Information )  Stress Factors  Patient Stress Factors Other (Comment) (Advance Directive )  Family Stress Factors Other (Comment) (Advance Directive )  Advance Directives (For Healthcare)  Does Patient Have a Medical Advance Directive? No  Would patient like information on creating a medical advance directive? Yes (Inpatient - patient requests chaplain consult to create a medical advance directive) (Patient given information )   I visited with the patient per [place] consult for an Advance Directive. The patient could not stay awake during my visit, so complete education was not done with her on the document. She asked me to explain everything to her sister, who was at the bedside. I talked to the sister about the document and left a copy with her for she and the patient to review.  I recommend a Chaplain coming in when the patient is awake to educate her about the document.  She was able to tell me that she wants her sister and granddaughter as her Pharmacologist.   Please, contact Spiritual Care when ready to discuss further.   [place] M.Div.

## 2017-05-25 NOTE — H&P (Deleted)
[facility] History and Physical  Judy [name] OMB:[id number] DOB: 1946-04-18 DOA: 05/24/2017  Referring physician: Dr Billy Fischer PCP: [name], DO   Chief Complaint: SOB, R leg weakness, abnromal breathing  HPI: Judy [name] is a 71 y.o. female with hx of depression/ anxiety, bipolar d/o, vertigo who presents to ED with d/o 6 -8 mos of DOE/ abnormal breathing patterns (breathing w/ her belly), also 4 month hx of R leg weakness and numbness, "dragging the foot"; and also she fell at home.  No chest pain.  Had cardiac eval Dec 2017 (see below).  Pt denies any wt loss, fevers, prod cough, no orthopnea or PND.  In ED CXR is clear. And CT head shows no acute fracture or stroke. Scattered small vessel ischemic microangiopathy.  We are asked to see for admission.   In 2015 pt w depression/ anxiety/ bipolar, HTN, HL and vertigo presented with syncope and had w/u including MRI neg for CVA, CE's x 3 were negative, 2D ECHO w/ normal EF, and no events on telemetry.  Was having some dysphagia, they decided for OP eval of dysphagia.   In Oct 2017 in OP setting reported DOE , CXR was normal.  Sent to cardiology Dec 2017, Dr cooper saw her.  They did echo which was unrevealing (EF 60% and G1DD).  There was a "large defect of moderate severity that .[name]... Was partially reversible.Marland KitchenMarland KitchenMarland KitchenMarland Kitchenpossible breast attentuation.  They wanted to do cardiac CT scan for Ca score but pt's insurance wouldn't cover it.  Cardiology felt that the study was abnormal due to breast attenuation and didn't recommend heart cath.    Seen in ED 05/16/17 with SOB , and jaw/ tongue swelling x 4 mos.  CT neck and CXR were negative.  She reported twitching and the ED felt this was related to tardive dyskinesia and she was started on Cogentin.  Also has been having problems w/ the R leg being "numb" and weak, "dragging the foot " per the family members, x 3-4 monts.  Also pain the back of the R leg.  She was dc'd from ED and recommended to  see a neurologist.    Patient denies any N/V/D, abd pain, joint pain or swelling.     Past Medical History  Past Medical History:  Diagnosis Date  . Allergic rhinitis   . Anemia   . Anxiety   . Arthritis   . Bipolar 1 disorder ([place])   . Depression   . Diverticulosis of colon (without mention of hemorrhage) 08/25/2011   [name]  . 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 Hyperlipidemia   . Hypertension   . Obesity   . Vertigo    Past Surgical History  Past Surgical History:  Procedure Laterality Date  . COLONOSCOPY  08/25/2011   Procedure: COLONOSCOPY;  Surgeon: Landry Dyke, MD;  Location: [facility];  Service: Endoscopy;  Laterality: N/A;  . DILATION AND CURETTAGE OF UTERUS  1960's   Family History  Family History  Problem Relation Age of Onset  . Prostate cancer Brother   . Hypertension Brother   . Kidney disease Brother   . Colon cancer Brother   . Hypertension Mother   . Kidney disease Mother   . Stomach cancer Father   . Hyperlipidemia Sister   . Hypothyroidism Sister   . Stroke Brother   . Prostate cancer Brother    Social History  reports that she has never smoked. She has never  used smokeless tobacco. She reports that she does not drink alcohol or use drugs. Allergies No Known Allergies Home medications Prior to Admission medications   Medication Sig Start Date End Date Taking? Authorizing Provider  acetaminophen (TYLENOL) 500 MG tablet Take 500 mg by mouth daily as needed for moderate pain.   Yes [provider]  amLODipine (NORVASC) 5 MG tablet Take 1 tablet (5 mg total) by mouth daily. 05/16/17  Yes Kirichenko, Tatyana, PA-C  benztropine (COGENTIN) 1 MG tablet Take 1 tablet (1 mg total) by mouth 2 (two) times daily. 05/15/17  Yes Kirichenko, Tatyana, PA-C  buPROPion (WELLBUTRIN XL) 150 MG 24 hr tablet Take 150 mg by mouth every morning. Take one tablet every morning 02/28/17  Yes [provider]  famotidine (PEPCID) 20 MG  tablet One at bedtime 04/15/17  Yes Tanda Rockers, MD  FLUoxetine HCl 60 MG TABS Take 60 mg by mouth every morning. 08/03/16  Yes [provider]  LATUDA 120 MG TABS Take 120 mg by mouth at bedtime. With 350 calories 07/31/16  Yes [provider]  linaclotide (LINZESS) 145 MCG CAPS capsule Take 145 mcg by mouth daily as needed (constipation).   Yes [provider]  Menthol, Topical Analgesic, (BIOFREEZE) 4 % GEL Apply 1 application topically 2 (two) times daily as needed (pain).   Yes [provider]  naproxen sodium (ANAPROX) 220 MG tablet Take 440 mg by mouth daily as needed.   Yes [provider]  pantoprazole (PROTONIX) 40 MG tablet Take 1 tablet (40 mg total) by mouth daily. Take 30-60 min before first meal of the day 04/15/17  Yes Tanda Rockers, MD   Liver Function Tests  Recent Labs Lab 05/24/17 2213  AST 30  ALT 28  ALKPHOS 74  BILITOT 0.4  PROT 7.5  ALBUMIN 4.0   No results for input(s): LIPASE, AMYLASE in the last 168 hours. CBC  Recent Labs Lab 05/24/17 2213  WBC 9.3  NEUTROABS 5.5  HGB 13.2  HCT 38.0  MCV 85.8  PLT 268   Basic Metabolic Panel  Recent Labs Lab 05/24/17 2213  NA 140  K 3.2*  CL 103  CO2 27  GLUCOSE 98  BUN 18  CREATININE 0.92  CALCIUM 9.6     Vitals:   05/24/17 2200 05/24/17 2230 05/24/17 2301 05/25/17 0025  BP: (!) 156/84 (!) 164/75 (!) 123/106 (!) 148/76  Pulse: (!) 58 63 61 62  Resp: (!) 24 20 (!) 21 (!) 21  Temp:      TempSrc:      SpO2: 99% 100% 98% 98%   Exam: Gen obese AAF, pleasant , not in distress, abdominal breathing pattern is noted Also there is lots of activity going on constantly in the mouth, the tonque/ floor of the mouth muscles are constantly moving     No rash, cyanosis or gangrene Sclera anicteric, throat clear  No jvd or bruits,no LAN Chest clear bilat to the bases RRR no MRG Breast no mass bilat Abd soft ntnd no mass or ascites +bs markedly obese GU   defer MS no joint effusions or deformity Ext no LE or UE edema / no wounds or ulcers Neuro is alert, fully oriented, unusual breathing pattern; strength in R foot and L foot seem equal. Can't raise R leg off the bed though, L leg comes up about 2 inches.  UE's equal strength.      Home meds: -norvasc 5 qd -cogentin 1 mg bid/ wellbutrin XL  150 mg qd/ prozac 20 mg hs/ lurasidone 120 mg qhs -anaprox/ protonix/ pepcid/ tylenol    Assessment: 1.  SP fall - in patient w/ progressive problems with DOE, dysphagia, falls, R leg weakness. On exam she has repetitive movements of the muscles of the floor of the mouth, significant psychomotor retardation (per family used to be quite animated) and has been on antipsychotics for years. She has altered breathing pattern and hx of dysphagia in the past two yrs as well.  Suspect this is parkinson's , either primary or more likely secondary to long-term antipsychotics (i.e. Tardive dyskinesia).  Would ask neuro to see in am. They can do other studies as needed.  Will get ST for swallowing eval.  She has a psychiatrist locally that she sees as well and perhaps they can get her off of antipsychotics.  2.  Bipolar d/o  3.  HTN 4.  HOH   Plan - as above      [place] D [facility] Pager [phone]   If 7PM-7AM, please contact night-coverage [email or web address] Password [email or web address] 05/25/2017, 1:24 AM

## 2017-05-25 NOTE — Progress Notes (Signed)
PROGRESS NOTE        PATIENT DETAILS Name: Judy [name] Age: 71 y.o. Sex: female Date of Birth: [birth year] Admit Date: 05/24/2017 Admitting Physician Roney Jaffe, MD BOF:BPZWCH, Brayton Layman, DO  Brief Narrative: Patient is a 71 y.o. female with history of depression/anxiety/bipolar disorder on multiple psychiatric medications, admitted with 4 month history of progressive lower extremity weakness (right more than left), also with worsening dysphagia and some tardive dyskinesia-like symptoms. Neurology consulted, with recommendations to pursue MRI of cervical and lumbar spine, autoimmune workup has been sent-neurology also recommends that this patient be transferred to [facility] for further close neurology evaluation. See below for further details.  Subjective: Barely awake-appears drowsy-sister providing most of the history. No major events overnight. She does not appear to be in any distress.   Assessment/Plan: Bilateral lower extremity weakness (right more than left) tardive dyskinesia-like symptoms and possible dysphagia: Mildly elevated CPK, TSH within normal limits. Not sure if she recently had acute demyelinating syndrome, and is now left with resultant  deficits. In any event, I have ordered a lumbosacral spine MRI, neurology recommending that we get a cervical spine MRI as well. Neurology has ordered serological tests, neurology also recommended to be transferred to [facility]. Await speech therapy recommendations as well. Will defer further workup and treatment to neurology at this time.  History of bipolar disorder/depression: She appears stable, continue her usual medications and benztropine.  Hypertension: Controlled with amlodipine.  Hard of hearing: Follow with PCP  Morning labs/Imaging ordered: yes  DVT Prophylaxis: Prophylactic Lovenox   Code Status: Full code   Family Communication: Sister at bedside  Disposition  Plan: Remain inpatient-transfer to [facility]   Antimicrobial agents: Anti-infectives    None      Procedures: None  CONSULTS:  neurology  Time spent: 25- minutes-Greater than 50% of this time was spent in counseling, explanation of diagnosis, planning of further management, and coordination of care.  MEDICATIONS: Scheduled Meds: . amLODipine  5 mg Oral Daily  . benztropine  1 mg Oral BID  . buPROPion  150 mg Oral q morning - 10a  . enoxaparin (LOVENOX) injection  40 mg Subcutaneous Q24H  . FLUoxetine  60 mg Oral q morning - 10a  . lurasidone  120 mg Oral QHS  . pantoprazole  40 mg Oral Daily   Continuous Infusions: . sodium chloride 75 mL/hr at 05/25/17 0403   PRN Meds:.acetaminophen, bisacodyl, linaclotide, ondansetron **OR** ondansetron (ZOFRAN) IV   PHYSICAL EXAM: Vital signs: Vitals:   05/25/17 0025 05/25/17 0141 05/25/17 0319 05/25/17 0402  BP: (!) 148/76 (!) 161/95 120/84 (!) 162/79  Pulse: 62 62 66 61  Resp: (!) 21 (!) 23 16 18   Temp:    99.2 F (37.3 C)  TempSrc:    Oral  SpO2: 98% 100% 98% 100%  Weight:    107.7 kg (237 lb 6.4 oz)  Height:    5\' 9"  (1.753 m)   Filed Weights   05/25/17 0402  Weight: 107.7 kg (237 lb 6.4 oz)   Body mass index is 35.06 kg/m.   General appearance :Awake, Drowsy but answers some questions appropriately.  Eyes:, pupils equally reactive to light and accomodation,no scleral icterus. HEENT: Atraumatic and Normocephalic Neck: supple, no JVD. No cervical lymphadenopathy.  Resp:Good air entry bilaterally, no added sounds  CVS: S1 S2 regular, no murmurs.  GI: Bowel sounds present, Non tender and not distended with no gaurding, rigidity or rebound. Extremities: B/L Lower Ext shows no edema, both legs are warm to touch Neurology:  Upper extremities 5/5, difficult exam given her drowsiness-but appears to have approximately 4/5 strength in her lower extremities. Unable to obtain any reflexes in the lower  extremities. Musculoskeletal:No digital cyanosis Skin:No Rash, warm and dry Wounds:N/A  I have personally reviewed following labs and imaging studies  LABORATORY DATA: CBC:  Recent Labs Lab 05/24/17 2213  WBC 9.3  NEUTROABS 5.5  HGB 13.2  HCT 38.0  MCV 85.8  PLT 952    Basic Metabolic Panel:  Recent Labs Lab 05/24/17 2213  NA 140  K 3.2*  CL 103  CO2 27  GLUCOSE 98  BUN 18  CREATININE 0.92  CALCIUM 9.6    GFR: Estimated Creatinine Clearance: 75.4 mL/min (by C-G formula based on SCr of 0.92 mg/dL).  Liver Function Tests:  Recent Labs Lab 05/24/17 2213  AST 30  ALT 28  ALKPHOS 74  BILITOT 0.4  PROT 7.5  ALBUMIN 4.0   No results for input(s): LIPASE, AMYLASE in the last 168 hours. No results for input(s): AMMONIA in the last 168 hours.  Coagulation Profile: No results for input(s): INR, PROTIME in the last 168 hours.  Cardiac Enzymes:  Recent Labs Lab 05/24/17 2213  CKTOTAL 431*    BNP (last 3 results)  Recent Labs  04/15/17 1703  PROBNP 23.0    HbA1C: No results for input(s): HGBA1C in the last 72 hours.  CBG: No results for input(s): GLUCAP in the last 168 hours.  Lipid Profile: No results for input(s): CHOL, HDL, LDLCALC, TRIG, CHOLHDL, LDLDIRECT in the last 72 hours.  Thyroid Function Tests:  Recent Labs  05/24/17 2157  TSH 1.450    Anemia Panel: No results for input(s): VITAMINB12, FOLATE, FERRITIN, TIBC, IRON, RETICCTPCT in the last 72 hours.  Urine analysis:    Component Value Date/Time   COLORURINE DARK YELLOW 03/25/2017 1550   APPEARANCEUR CLEAR 03/25/2017 1550   LABSPEC 1.021 03/25/2017 1550   PHURINE 6.5 03/25/2017 1550   GLUCOSEU NEGATIVE 03/25/2017 1550   HGBUR NEGATIVE 03/25/2017 1550   BILIRUBINUR NEGATIVE 03/25/2017 1550   KETONESUR NEGATIVE 03/25/2017 1550   PROTEINUR TRACE (A) 03/25/2017 1550   UROBILINOGEN 1.0 12/10/2013 2001   NITRITE NEGATIVE 03/25/2017 1550   LEUKOCYTESUR 1+ (A) 03/25/2017  1550    Sepsis Labs: Lactic Acid, Venous No results found for: LATICACIDVEN  MICROBIOLOGY: No results found for this or any previous visit (from the past 240 hour(s)).  RADIOLOGY STUDIES/RESULTS: Dg Chest 2 View  Result Date: 05/24/2017 CLINICAL DATA:  Pt states being short of breath and falling earlier today, pt states both thighs are sore due to injections. Pt states she has had SOB since Sunday. Pt has no other chest complaints at this time. Pt HX: asthma, HTN EXAM: CHEST  2 VIEW COMPARISON:  05/15/2017 FINDINGS: Tortuous thoracic aorta. Mild enlargement of the cardiopericardial silhouette, without edema. Lungs appear clear. Thoracic spondylosis and kyphosis. No pleural effusion. IMPRESSION: 1. Mild cardiomegaly, without edema.  Tortuous thoracic aorta. 2. Thoracic kyphosis and spondylosis. Electronically Signed   By: Van Clines M.D.   On: 05/24/2017 21:15   Dg Chest 2 View  Result Date: 05/15/2017 CLINICAL DATA:  Dyspnea x9 months EXAM: CHEST  2 VIEW COMPARISON:  04/18/2017 FINDINGS: The heart size and mediastinal contours are within normal limits. Uncoiling of the thoracic aorta without aneurysm. Blunting of  the right lateral costophrenic angle may represent a trace right effusion versus pleural thickening. Both lungs are clear. The visualized skeletal structures are stable with midthoracic kyphosis attributable to multilevel disc space narrowing and mild chronic anterior wedging of T7 and T8. IMPRESSION: No active cardiopulmonary disease. Trace right pleural effusion versus minimal pleural thickening. Electronically Signed   By: Ashley Royalty M.D.   On: 05/15/2017 20:20   Dg Knee 2 Views Right  Result Date: 05/24/2017 CLINICAL DATA:  [place] 1 month ago.  Persistent weakness. EXAM: RIGHT KNEE - 1-2 VIEW COMPARISON:  RIGHT knee radiograph July 23, 2016 FINDINGS: No acute fracture deformity or dislocation. Similar tibial spine peaking. Mild-to-moderate patellofemoral compartment  narrowing with periarticular sclerosis and marginal spurring compatible with osteoarthrosis. Mild mediolateral compartment marginal spurring. Similar calcification along the medial femoral epicondyles seen with old ligamentous injury. No destructive bony lesions. Soft tissue planes are nonsuspicious. IMPRESSION: Stable degenerative change of the knee without acute osseous process. Electronically Signed   By: Elon Alas M.D.   On: 05/24/2017 23:49   Ct Head Wo Contrast  Result Date: 05/24/2017 CLINICAL DATA:  Status post fall, with progressive right leg weakness, acute onset. Initial encounter. EXAM: CT HEAD WITHOUT CONTRAST TECHNIQUE: Contiguous axial images were obtained from the base of the skull through the vertex without intravenous contrast. COMPARISON:  CT of the head performed 12/10/2013, and MRI of the brain performed 03/06/2014 FINDINGS: Brain: No evidence of acute infarction, hemorrhage, hydrocephalus, extra-axial collection or mass lesion/mass effect. Scattered periventricular and subcortical white matter change likely reflects small vessel ischemic microangiopathy. The posterior fossa, including the cerebellum, brainstem and fourth ventricle, is within normal limits. The third and lateral ventricles, and basal ganglia are unremarkable in appearance. The cerebral hemispheres are symmetric in appearance, with normal gray-white differentiation. No mass effect or midline shift is seen. Vascular: No hyperdense vessel or unexpected calcification. Skull: There is no evidence of fracture; visualized osseous structures are unremarkable in appearance. Sinuses/Orbits: The orbits are within normal limits. The paranasal sinuses and mastoid air cells are well-aerated. Other: No significant soft tissue abnormalities are seen. IMPRESSION: 1. No evidence of traumatic intracranial injury or fracture. 2. Scattered small vessel ischemic microangiopathy. Electronically Signed   By: Garald Balding M.D.   On:  05/24/2017 22:43   Ct Soft Tissue Neck W Contrast  Result Date: 05/15/2017 CLINICAL DATA:  Throat and tongue swelling for several days. RIGHT leg pain. EXAM: CT NECK WITH CONTRAST TECHNIQUE: Multidetector CT imaging of the neck was performed using the standard protocol following the bolus administration of intravenous contrast. CONTRAST:  75 cc ISOVUE-300 IOPAMIDOL (ISOVUE-300) INJECTION 61% COMPARISON:  MRI of the head Mar 06, 2014 FINDINGS: Moderately motion degraded examination. PHARYNX AND LARYNX: Normal.  Widely patent airway. SALIVARY GLANDS: Normal. THYROID: Mildly heterogeneous LEFT thyroid without dominant nodule. LYMPH NODES: No lymphadenopathy by CT size criteria. VASCULAR: Normal. LIMITED INTRACRANIAL: Normal. VISUALIZED ORBITS: Normal. MASTOIDS AND VISUALIZED PARANASAL SINUSES: Well-aerated. SKELETON: Nonacute. Multilevel moderate degenerative change of the cervical spine. Moderate to severe RIGHT C4-5, moderate to severe RIGHT C5-6 neural foraminal narrowing. UPPER CHEST: Lung apices are clear. No superior mediastinal lymphadenopathy. OTHER: None. IMPRESSION: No acute process in the neck on this moderately motion degraded examination. Patent airway. Electronically Signed   By: Elon Alas M.D.   On: 05/15/2017 22:24     LOS: 0 days   Oren Binet, MD  [facility] Pager:[phone]  If 7PM-7AM, please contact night-coverage [email or web address] Password [facility] 05/25/2017, 10:46 AM

## 2017-05-25 NOTE — Progress Notes (Signed)
CSW contacted by patient's sister who requested information about Medicaid. CSW provided requested information to patient's sister-in-law, patient being transferred to [facility] and will be followed by CSW at [facility].   Abundio Miu, [place] Social Worker [facility] Cell#: [phone]

## 2017-05-25 NOTE — Evaluation (Addendum)
SLP Cancellation Note  Patient Details Name: Judy [name] MRN: [id number] DOB: 03-11-1946   Cancelled treatment:       Reason Eval/Treat Not Completed: Other (comment);Patient at procedure or test/unavailable (pt with chaplain and was lethargic). Will continue efforts.  Note she is scheduled for MRI/lumbar spine.    [name], [place] [facility] SLP [phone]  Macario Golds 05/25/2017, 2:32 PM

## 2017-05-25 NOTE — Consult Note (Signed)
NEURO HOSPITALIST CONSULT NOTE   Requestig physician: Dr. Sloan Leiter   Reason for Consult:4 months of progressive lower extremity weakness   History obtained from:  Patient   sister  and chart as at this time patient is significantly sedated from medications   HPI:                                                                                                                                          Judy [name] is an 71 y.o. female with hx of depression/ anxiety, bipolar d/o, vertigo who presents to ED with d/o 6 -8 mos of DOE/ abnormal breathing patterns (breathing w/ her belly), also 4 month hx of R leg weakness and numbness, "dragging the foot"; and also she fell at home.   In talking to the sister she had a fall 1. I'm about 4 months ago and ever since then she is noted some weakness in her lower extremities. She has complained of low back pain and pain radiating down both legs into her calf. She denies any bowel or bladder dysfunction however the sister does state that at time she may dribble urine. Per sister she's been able to ambulate over the last couple weeks. She did see her PCP who is post set her up with a neurology appointment however that was never done. Due to the worsening symptoms patient's sister went to her PCP who then told her to go to the ER for further evaluation.  Patient complains of discomfort in her hips and right thigh. It is very hard to get a history however she does state that when this started she noted that she would lean forward on a shopping cart when she was shopping and it felt better when she would lean forward and then stand erect.  Sister states that there was some numbness and tingling however, it was not a sending and it was mostly in the right thigh and hips.  As stated prior it was very difficult to get a good history as the patient had just received pain medication and was severely sedated. In addition this has been going on for  at least 4 months.    Diagnosis Date  . Allergic rhinitis   . Anemia   . Anxiety   . Arthritis   . Bipolar 1 disorder ([place])   . Depression   . Diverticulosis of colon (without mention of hemorrhage) 08/25/2011   [name]  . 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 Hyperlipidemia   . Hypertension   . Obesity   . Vertigo     Past Surgical History:  Procedure Laterality Date  . COLONOSCOPY  08/25/2011   Procedure: COLONOSCOPY;  Surgeon: Landry Dyke, MD;  Location: [facility];  Service: Endoscopy;  Laterality: N/A;  . DILATION AND CURETTAGE OF UTERUS  1960's    Family History  Problem Relation Age of Onset  . Prostate cancer Brother   . Hypertension Brother   . Kidney disease Brother   . Colon cancer Brother   . Hypertension Mother   . Kidney disease Mother   . Stomach cancer Father   . Hyperlipidemia Sister   . Hypothyroidism Sister   . Stroke Brother   . Prostate cancer Brother     Social History:  reports that she has never smoked. She has never used smokeless tobacco. She reports that she does not drink alcohol or use drugs.  No Known Allergies  MEDICATIONS:                                                                                                                     Prior to Admission:  Prescriptions Prior to Admission  Medication Sig Dispense Refill Last Dose  . acetaminophen (TYLENOL) 500 MG tablet Take 500 mg by mouth daily as needed for moderate pain.   05/24/2017 at Unknown time  . amLODipine (NORVASC) 5 MG tablet Take 1 tablet (5 mg total) by mouth daily. 30 tablet 0 05/24/2017 at Unknown time  . benztropine (COGENTIN) 1 MG tablet Take 1 tablet (1 mg total) by mouth 2 (two) times daily. 20 tablet 0 Past Month at Unknown time  . buPROPion (WELLBUTRIN XL) 150 MG 24 hr tablet Take 150 mg by mouth every morning. Take one tablet every morning  1 05/24/2017 at Unknown time  . famotidine (PEPCID) 20 MG tablet One at bedtime 30 tablet 11 Past Week at  Unknown time  . FLUoxetine HCl 60 MG TABS Take 60 mg by mouth every morning.   05/24/2017 at Unknown time  . LATUDA 120 MG TABS Take 120 mg by mouth at bedtime. With 350 calories   05/24/2017 at Unknown time  . linaclotide (LINZESS) 145 MCG CAPS capsule Take 145 mcg by mouth daily as needed (constipation).   05/24/2017 at Unknown time  . Menthol, Topical Analgesic, (BIOFREEZE) 4 % GEL Apply 1 application topically 2 (two) times daily as needed (pain).   05/24/2017 at Unknown time  . naproxen sodium (ANAPROX) 220 MG tablet Take 440 mg by mouth daily as needed.   05/23/2017 at Unknown time  . pantoprazole (PROTONIX) 40 MG tablet Take 1 tablet (40 mg total) by mouth daily. Take 30-60 min before first meal of the day 30 tablet 2 05/24/2017 at Unknown time   Scheduled: . amLODipine  5 mg Oral Daily  . benztropine  1 mg Oral BID  . buPROPion  150 mg Oral q morning - 10a  . enoxaparin (LOVENOX) injection  40 mg Subcutaneous Q24H  . FLUoxetine  60 mg Oral q morning - 10a  . lurasidone  120 mg Oral QHS  . pantoprazole  40 mg Oral Daily     ROS:  History obtained from unobtainable from patient due to Significant sedation     Blood pressure (!) 162/79, pulse 61, temperature 99.2 F (37.3 C), temperature source Oral, resp. rate 18, height 5\' 9"  (1.753 m), weight 107.7 kg (237 lb 6.4 oz), SpO2 100 %.   Neurologic Examination:                                                                                                      HEENT-  Normocephalic, no lesions, without obvious abnormality.  Normal external eye and conjunctiva.  Normal TM's bilaterally.  Normal auditory canals and external ears. Normal external nose, mucus membranes and septum.  Normal pharynx. Cardiovascular- S1, S2 normal, pulses palpable throughout   Lungs- chest clear, no wheezing, rales, normal symmetric  air entry, Heart exam - S1, S2 normal, no murmur, no gallop, rate regular Abdomen- normal findings: bowel sounds normal Extremities- no edema Lymph-no adenopathy palpable Musculoskeletal-no joint tenderness, deformity or swelling Skin-warm and dry, no hyperpigmentation, vitiligo, or suspicious lesions  Neurological Examination Mental Status: Definitely sedated but alert to where she is, able to give some history. Showing no dysarthria or a aphasia. Attempts to follow commands to the best of her ability  Cranial Nerves: II:  Visual fields grossly normal,  III,IV, VI: ptosis not present, extra-ocular motions intact bilaterally pupils equal, round, reactive to light and accommodation V,VII: smile symmetric, facial light touch sensation normal bilaterally VIII: hearing normal bilaterally IX,X: uvula rises symmetrically XI: bilateral shoulder shrug XII: midline tongue extension Motor: Bilateral upper extremities 5/5. Bilateral hip flexion 4/5. Bilateral knee extension and flexion 5-/5, bilateral dorsiflexion and plantarflexion is 5/5 . It should be noted that patient would give minimal effort at times and other times pain was a factor in the effort that she gave  Sensory:unable to get a reliable sensory exam as patient would fall asleep. Reflexes: 2+ and symmetric throughout upper extremities with no lower extremity reflexes  Plantars: Right: downgoing   Left: downgoing Cerebellar: normal finger-to-nose,unable to get heel to shin secondary to patient's pain and cooperation Gait: not tested   Lab Results: Basic Metabolic Panel:  Recent Labs Lab 05/24/17 2213  NA 140  K 3.2*  CL 103  CO2 27  GLUCOSE 98  BUN 18  CREATININE 0.92  CALCIUM 9.6    Liver Function Tests:  Recent Labs Lab 05/24/17 2213  AST 30  ALT 28  ALKPHOS 74  BILITOT 0.4  PROT 7.5  ALBUMIN 4.0   No results for input(s): LIPASE, AMYLASE in the last 168 hours. No results for input(s): AMMONIA in the last  168 hours.  CBC:  Recent Labs Lab 05/24/17 2213  WBC 9.3  NEUTROABS 5.5  HGB 13.2  HCT 38.0  MCV 85.8  PLT 325    Cardiac Enzymes:  Recent Labs Lab 05/24/17 2213  CKTOTAL 431*    Lipid Panel: No results for input(s): CHOL, TRIG, HDL, CHOLHDL, VLDL, LDLCALC in the last 168 hours.  CBG: No results for input(s): GLUCAP in the last 168 hours.  Microbiology: Results for orders placed or performed during  the hospital encounter of 12/10/13  Urine culture     Status: None   Collection Time: 12/10/13  8:01 PM  Result Value Ref Range Status   Specimen Description URINE, CLEAN CATCH  Final   Special Requests NONE  Final   Culture  Setup Time   Final    12/11/2013 01:44 Performed at [place]   Final    >=100,000 COLONIES/ML Performed at [facility]   Culture   Final    GROUP B STREP(S.AGALACTIAE)ISOLATED Note: TESTING AGAINST S. AGALACTIAE NOT ROUTINELY PERFORMED DUE TO PREDICTABILITY OF AMP/PEN/VAN SUSCEPTIBILITY. Performed at [facility]   Report Status 12/12/2013 FINAL  Final    Coagulation Studies: No results for input(s): LABPROT, INR in the last 72 hours.  Imaging: Dg Chest 2 View  Result Date: 05/24/2017 CLINICAL DATA:  Pt states being short of breath and falling earlier today, pt states both thighs are sore due to injections. Pt states she has had SOB since Sunday. Pt has no other chest complaints at this time. Pt HX: asthma, HTN EXAM: CHEST  2 VIEW COMPARISON:  05/15/2017 FINDINGS: Tortuous thoracic aorta. Mild enlargement of the cardiopericardial silhouette, without edema. Lungs appear clear. Thoracic spondylosis and kyphosis. No pleural effusion. IMPRESSION: 1. Mild cardiomegaly, without edema.  Tortuous thoracic aorta. 2. Thoracic kyphosis and spondylosis. Electronically Signed   By: Van Clines M.D.   On: 05/24/2017 21:15   Dg Knee 2 Views Right  Result Date: 05/24/2017 CLINICAL DATA:  [place] 1 month ago.   Persistent weakness. EXAM: RIGHT KNEE - 1-2 VIEW COMPARISON:  RIGHT knee radiograph July 23, 2016 FINDINGS: No acute fracture deformity or dislocation. Similar tibial spine peaking. Mild-to-moderate patellofemoral compartment narrowing with periarticular sclerosis and marginal spurring compatible with osteoarthrosis. Mild mediolateral compartment marginal spurring. Similar calcification along the medial femoral epicondyles seen with old ligamentous injury. No destructive bony lesions. Soft tissue planes are nonsuspicious. IMPRESSION: Stable degenerative change of the knee without acute osseous process. Electronically Signed   By: Elon Alas M.D.   On: 05/24/2017 23:49   Ct Head Wo Contrast  Result Date: 05/24/2017 CLINICAL DATA:  Status post fall, with progressive right leg weakness, acute onset. Initial encounter. EXAM: CT HEAD WITHOUT CONTRAST TECHNIQUE: Contiguous axial images were obtained from the base of the skull through the vertex without intravenous contrast. COMPARISON:  CT of the head performed 12/10/2013, and MRI of the brain performed 03/06/2014 FINDINGS: Brain: No evidence of acute infarction, hemorrhage, hydrocephalus, extra-axial collection or mass lesion/mass effect. Scattered periventricular and subcortical white matter change likely reflects small vessel ischemic microangiopathy. The posterior fossa, including the cerebellum, brainstem and fourth ventricle, is within normal limits. The third and lateral ventricles, and basal ganglia are unremarkable in appearance. The cerebral hemispheres are symmetric in appearance, with normal gray-white differentiation. No mass effect or midline shift is seen. Vascular: No hyperdense vessel or unexpected calcification. Skull: There is no evidence of fracture; visualized osseous structures are unremarkable in appearance. Sinuses/Orbits: The orbits are within normal limits. The paranasal sinuses and mastoid air cells are well-aerated. Other: No  significant soft tissue abnormalities are seen. IMPRESSION: 1. No evidence of traumatic intracranial injury or fracture. 2. Scattered small vessel ischemic microangiopathy. Electronically Signed   By: Garald Balding M.D.   On: 05/24/2017 22:43       Assessment and plan per attending neurologist  Etta Quill PA-C [facility] [phone]  05/25/2017, 9:22 AM   Assessment/Plan:  This is a  71 year old female with a four-month history of progressive lower extremity weakness apparently right greater than left. There is some mention of a fall prior to the weakness however it is unclear at this time. Etiology at this time is not clear.   Recommend: 1-MRI of cervical spine and lumbar spine 2-rheumatoid factor,ANA, ACE, SSB/SSA, ANCA, TSH 3- discussed with Dr. Sloan Leiter and due to the complexity of her case she will be transferred to [facility] where Neurology and IM can follow closer.     NEUROHOSPITALIST ADDENDUM I have not seen the patient. I have discussed the plan and agree with transfer to [facility] due to complexity of her case.    Karena Addison Sailor Hevia MD [facility] [phone]  If 7pm to 7am, please call on call as listed on AMION.

## 2017-05-25 NOTE — H&P (Signed)
[facility] History and Physical  Judy [name] ZDG:[id number] DOB: Nov 07, 1945 DOA: 05/24/2017  Referring physician: Dr Billy Fischer PCP: [name], DO   Chief Complaint: SOB, R leg weakness, abnromal breathing  HPI: [name] is a 71 y.o. female with hx of depression/ anxiety, bipolar d/o, vertigo who presents to ED with d/o 6 -8 mos of DOE/ abnormal breathing patterns (breathing w/ her belly), also 4 month hx of R leg weakness and numbness, "dragging the foot"; and also she fell at home.  No chest pain.  Had cardiac eval Dec 2017 (see below).  Pt denies any wt loss, fevers, prod cough, no orthopnea or PND.  In ED CXR is clear. And CT head shows no acute fracture or stroke. Scattered small vessel ischemic microangiopathy.  We are asked to see for admission.   In 2015 pt w depression/ anxiety/ bipolar, HTN, HL and vertigo presented with syncope and had w/u including MRI neg for CVA, CE's x 3 were negative, 2D ECHO w/ normal EF, and no events on telemetry.  Was having some dysphagia, they decided for OP eval of dysphagia.   In Oct 2017 in OP setting reported DOE , CXR was normal.  Sent to cardiology Dec 2017, Dr cooper saw her.  They did echo which was unrevealing (EF 60% and G1DD).  There was a "large defect of moderate severity that .[name]... Was partially reversible.Marland KitchenMarland KitchenMarland KitchenMarland Kitchenpossible breast attentuation.  They wanted to do cardiac CT scan for Ca score but pt's insurance wouldn't cover it.  Cardiology felt that the study was abnormal due to breast attenuation and didn't recommend heart cath.    Seen in ED 05/16/17 with SOB , and jaw/ tongue swelling x 4 mos.  CT neck and CXR were negative.  She reported twitching and the ED felt this was related to tardive dyskinesia and she was started on Cogentin.  Also has been having problems w/ the R leg being "numb" and weak, "dragging the foot " per the family members, x 3-4 monts.  Also pain the back of the R leg.  She was dc'd from ED and recommended to  see a neurologist.    Patient denies any N/V/D, abd pain, joint pain or swelling.     Past Medical History  Past Medical History:  Diagnosis Date  . Allergic rhinitis   . Anemia   . Anxiety   . Arthritis   . Bipolar 1 disorder ([place])   . Depression   . Diverticulosis of colon (without mention of hemorrhage) 08/25/2011   [name]  . 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 Hyperlipidemia   . Hypertension   . Obesity   . Vertigo    Past Surgical History  Past Surgical History:  Procedure Laterality Date  . COLONOSCOPY  08/25/2011   Procedure: COLONOSCOPY;  Surgeon: Landry Dyke, MD;  Location: [facility];  Service: Endoscopy;  Laterality: N/A;  . DILATION AND CURETTAGE OF UTERUS  1960's   Family History  Family History  Problem Relation Age of Onset  . Prostate cancer Brother   . Hypertension Brother   . Kidney disease Brother   . Colon cancer Brother   . Hypertension Mother   . Kidney disease Mother   . Stomach cancer Father   . Hyperlipidemia Sister   . Hypothyroidism Sister   . Stroke Brother   . Prostate cancer Brother    Social History  reports that she has never smoked. She has never  used smokeless tobacco. She reports that she does not drink alcohol or use drugs. Allergies No Known Allergies Home medications Prior to Admission medications   Medication Sig Start Date End Date Taking? Authorizing Provider  acetaminophen (TYLENOL) 500 MG tablet Take 500 mg by mouth daily as needed for moderate pain.   Yes [provider]  amLODipine (NORVASC) 5 MG tablet Take 1 tablet (5 mg total) by mouth daily. 05/16/17  Yes Kirichenko, Tatyana, PA-C  benztropine (COGENTIN) 1 MG tablet Take 1 tablet (1 mg total) by mouth 2 (two) times daily. 05/15/17  Yes Kirichenko, Tatyana, PA-C  buPROPion (WELLBUTRIN XL) 150 MG 24 hr tablet Take 150 mg by mouth every morning. Take one tablet every morning 02/28/17  Yes [provider]  famotidine (PEPCID) 20 MG  tablet One at bedtime 04/15/17  Yes Tanda Rockers, MD  FLUoxetine HCl 60 MG TABS Take 60 mg by mouth every morning. 08/03/16  Yes [provider]  LATUDA 120 MG TABS Take 120 mg by mouth at bedtime. With 350 calories 07/31/16  Yes [provider]  linaclotide (LINZESS) 145 MCG CAPS capsule Take 145 mcg by mouth daily as needed (constipation).   Yes [provider]  Menthol, Topical Analgesic, (BIOFREEZE) 4 % GEL Apply 1 application topically 2 (two) times daily as needed (pain).   Yes [provider]  naproxen sodium (ANAPROX) 220 MG tablet Take 440 mg by mouth daily as needed.   Yes [provider]  pantoprazole (PROTONIX) 40 MG tablet Take 1 tablet (40 mg total) by mouth daily. Take 30-60 min before first meal of the day 04/15/17  Yes Tanda Rockers, MD   Liver Function Tests  Recent Labs Lab 05/24/17 2213  AST 30  ALT 28  ALKPHOS 74  BILITOT 0.4  PROT 7.5  ALBUMIN 4.0   No results for input(s): LIPASE, AMYLASE in the last 168 hours. CBC  Recent Labs Lab 05/24/17 2213  WBC 9.3  NEUTROABS 5.5  HGB 13.2  HCT 38.0  MCV 85.8  PLT 295   Basic Metabolic Panel  Recent Labs Lab 05/24/17 2213  NA 140  K 3.2*  CL 103  CO2 27  GLUCOSE 98  BUN 18  CREATININE 0.92  CALCIUM 9.6     Vitals:   05/24/17 2230 05/24/17 2301 05/25/17 0025 05/25/17 0141  BP: (!) 164/75 (!) 123/106 (!) 148/76 (!) 161/95  Pulse: 63 61 62 62  Resp: 20 (!) 21 (!) 21 (!) 23  Temp:      TempSrc:      SpO2: 100% 98% 98% 100%   Exam: Gen obese AAF, pleasant , not in distress, abdominal breathing pattern is noted Also there is lots of activity going on constantly in the mouth, the tonque/ floor of the mouth muscles are constantly moving     No rash, cyanosis or gangrene Sclera anicteric, throat clear  No jvd or bruits,no LAN Chest clear bilat to the bases RRR no MRG Breast no mass bilat Abd soft ntnd no mass or ascites +bs markedly obese GU   defer MS no joint effusions or deformity Ext no LE or UE edema / no wounds or ulcers Neuro is alert, fully oriented, unusual breathing pattern; strength in R foot and L foot seem equal. Can't raise R leg off the bed though, L leg comes up about 2 inches.  UE's equal strength.      Home meds: -norvasc 5 qd -cogentin 1 mg bid/ wellbutrin XL 150  mg qd/ prozac 20 mg hs/ lurasidone 120 mg qhs -anaprox/ protonix/ pepcid/ tylenol    Assessment: 1.  SP fall - in patient w/ progressive problems with DOE, dysphagia, falls, R leg weakness. On exam she has repetitive movements of the muscles of the floor of the mouth, significant psychomotor retardation (per family used to be quite animated) and has been on antipsychotics for years. She has altered breathing pattern and hx of dysphagia in the past two yrs as well.  Suspect this is parkinson's , either primary or more likely secondary to long-term antipsychotics (i.e. Tardive dyskinesia).  Would ask neuro to see in am. They can do other studies as needed.  Will get ST for swallowing eval.  She has a psychiatrist locally that she sees as well and perhaps they can get her off of antipsychotics. Will hold Latuda for now. 2.  Bipolar d/o  3.  HTN 4.  HOH   Plan - as above      [place] D [facility] Pager [phone]   If 7PM-7AM, please contact night-coverage [email or web address] Password [email or web address] 05/25/2017, 2:09 AM

## 2017-05-26 ENCOUNTER — Encounter: Payer: Self-pay | Admitting: Internal Medicine

## 2017-05-26 ENCOUNTER — Observation Stay (HOSPITAL_COMMUNITY): Payer: Medicare HMO

## 2017-05-26 ENCOUNTER — Telehealth: Payer: Self-pay | Admitting: Internal Medicine

## 2017-05-26 DIAGNOSIS — F319 Bipolar disorder, unspecified: Secondary | ICD-10-CM

## 2017-05-26 DIAGNOSIS — M79604 Pain in right leg: Secondary | ICD-10-CM | POA: Diagnosis not present

## 2017-05-26 DIAGNOSIS — R1310 Dysphagia, unspecified: Secondary | ICD-10-CM

## 2017-05-26 DIAGNOSIS — M4316 Spondylolisthesis, lumbar region: Secondary | ICD-10-CM | POA: Diagnosis not present

## 2017-05-26 DIAGNOSIS — W19XXXA Unspecified fall, initial encounter: Secondary | ICD-10-CM

## 2017-05-26 DIAGNOSIS — H9192 Unspecified hearing loss, left ear: Secondary | ICD-10-CM | POA: Diagnosis not present

## 2017-05-26 DIAGNOSIS — M545 Low back pain: Secondary | ICD-10-CM | POA: Diagnosis not present

## 2017-05-26 DIAGNOSIS — G2401 Drug induced subacute dyskinesia: Secondary | ICD-10-CM | POA: Diagnosis not present

## 2017-05-26 DIAGNOSIS — R1319 Other dysphagia: Secondary | ICD-10-CM

## 2017-05-26 DIAGNOSIS — E876 Hypokalemia: Secondary | ICD-10-CM

## 2017-05-26 DIAGNOSIS — M79605 Pain in left leg: Secondary | ICD-10-CM | POA: Diagnosis not present

## 2017-05-26 DIAGNOSIS — I10 Essential (primary) hypertension: Secondary | ICD-10-CM | POA: Diagnosis not present

## 2017-05-26 DIAGNOSIS — R29898 Other symptoms and signs involving the musculoskeletal system: Secondary | ICD-10-CM

## 2017-05-26 DIAGNOSIS — K5900 Constipation, unspecified: Secondary | ICD-10-CM

## 2017-05-26 DIAGNOSIS — R69 Illness, unspecified: Secondary | ICD-10-CM | POA: Diagnosis not present

## 2017-05-26 DIAGNOSIS — M48061 Spinal stenosis, lumbar region without neurogenic claudication: Secondary | ICD-10-CM | POA: Diagnosis not present

## 2017-05-26 DIAGNOSIS — M50323 Other cervical disc degeneration at C6-C7 level: Secondary | ICD-10-CM | POA: Diagnosis not present

## 2017-05-26 LAB — COMPREHENSIVE METABOLIC PANEL
ALBUMIN: 3.5 g/dL (ref 3.5–5.0)
ALK PHOS: 73 U/L (ref 38–126)
ALT: 30 U/L (ref 14–54)
AST: 32 U/L (ref 15–41)
Anion gap: 13 (ref 5–15)
BILIRUBIN TOTAL: 0.6 mg/dL (ref 0.3–1.2)
BUN: 8 mg/dL (ref 6–20)
CALCIUM: 9.1 mg/dL (ref 8.9–10.3)
CO2: 22 mmol/L (ref 22–32)
CREATININE: 0.84 mg/dL (ref 0.44–1.00)
Chloride: 105 mmol/L (ref 101–111)
GFR calc Af Amer: >60 mL/min (ref >60)
GFR calc non Af Amer: >60 mL/min (ref >60)
GLUCOSE: 120 mg/dL — AB (ref 65–99)
Potassium: 3.5 mmol/L (ref 3.5–5.1)
SODIUM: 140 mmol/L (ref 135–145)
TOTAL PROTEIN: 6.7 g/dL (ref 6.5–8.1)

## 2017-05-26 LAB — CBC WITH DIFFERENTIAL/PLATELET
BASOS PCT: 0 %
Basophils Absolute: 0 10*3/uL (ref 0.0–0.1)
EOS ABS: 0.2 10*3/uL (ref 0.0–0.7)
Eosinophils Relative: 3 %
HEMATOCRIT: 38 % (ref 36.0–46.0)
HEMOGLOBIN: 12.7 g/dL (ref 12.0–15.0)
LYMPHS ABS: 2.6 10*3/uL (ref 0.7–4.0)
Lymphocytes Relative: 35 %
MCH: 28.9 pg (ref 26.0–34.0)
MCHC: 33.4 g/dL (ref 30.0–36.0)
MCV: 86.6 fL (ref 78.0–100.0)
MONOS PCT: 9 %
Monocytes Absolute: 0.7 10*3/uL (ref 0.1–1.0)
NEUTROS ABS: 3.8 10*3/uL (ref 1.7–7.7)
NEUTROS PCT: 53 %
Platelets: 326 10*3/uL (ref 150–400)
RBC: 4.39 MIL/uL (ref 3.87–5.11)
RDW: 15.4 % (ref 11.5–15.5)
WBC: 7.2 10*3/uL (ref 4.0–10.5)

## 2017-05-26 LAB — MPO/PR-3 (ANCA) ANTIBODIES

## 2017-05-26 LAB — ANCA TITERS: Atypical P-ANCA titer: <1:20 {titer}

## 2017-05-26 LAB — MAGNESIUM: Magnesium: 2 mg/dL (ref 1.7–2.4)

## 2017-05-26 LAB — SJOGRENS SYNDROME-A EXTRACTABLE NUCLEAR ANTIBODY: SSA (Ro) (ENA) Antibody, IgG: <0.2 AI (ref 0.0–0.9)

## 2017-05-26 LAB — RHEUMATOID FACTOR

## 2017-05-26 LAB — PHOSPHORUS: Phosphorus: 3.1 mg/dL (ref 2.5–4.6)

## 2017-05-26 LAB — SJOGRENS SYNDROME-B EXTRACTABLE NUCLEAR ANTIBODY: SSB (La) (ENA) Antibody, IgG: <0.2 AI (ref 0.0–0.9)

## 2017-05-26 LAB — ANGIOTENSIN CONVERTING ENZYME: Angiotensin-Converting Enzyme: 17 U/L (ref 14–82)

## 2017-05-26 LAB — ANA W/REFLEX IF POSITIVE: Anti Nuclear Antibody(ANA): NEGATIVE

## 2017-05-26 MED ORDER — GADOBENATE DIMEGLUMINE 529 MG/ML IV SOLN
20.0000 mL | Freq: Once | INTRAVENOUS | Status: AC | PRN
  Administered 2017-05-26: 20 mL via INTRAVENOUS

## 2017-05-26 NOTE — Care Management Obs Status (Signed)
[place] NOTIFICATION   Patient Details  Name: Judy [name] MRN: [id number] Date of Birth: 05/15/1946   Medicare Observation Status Notification Given:  Yes    Pollie Friar, RN 05/26/2017, 3:40 PM

## 2017-05-26 NOTE — Evaluation (Signed)
Clinical/Bedside Swallow Evaluation Patient Details  Name: Judy [name] MRN: [id number] Date of Birth: 14-Dec-1945  Today's Date: 05/26/2017 Time: SLP Start Time (ACUTE ONLY): 64 SLP Stop Time (ACUTE ONLY): 0928 SLP Time Calculation (min) (ACUTE ONLY): 8 min  Past Medical History:  Past Medical History:  Diagnosis Date  . Allergic rhinitis   . Anemia   . Anxiety   . Arthritis   . Bipolar 1 disorder ([place])   . Depression   . Diverticulosis of colon (without mention of hemorrhage) 08/25/2011   [name]  . 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 Hyperlipidemia   . Hypertension   . Obesity   . Vertigo    Past Surgical History:  Past Surgical History:  Procedure Laterality Date  . COLONOSCOPY  08/25/2011   Procedure: COLONOSCOPY;  Surgeon: Landry Dyke, MD;  Location: [facility];  Service: Endoscopy;  Laterality: N/A;  . DILATION AND CURETTAGE OF UTERUS  1960's   HPI:  Pt is a 71 year old female admitted with a fall, progressve right lower extremity weakness, riht greater than left, shortness of breath and abnormal breathing pattern over 4 months. Pt has a complaint of dysphagia over 2 years, repetitive movements of the muscles of the floor of the mouth, significant psychomotor retardation (per family used to be quite animated) and has been on antipsychotics for years.  Tardive dyskinesia suspected by MD vs Parkinsons. MRI shows chronic microvascular ischemia, no acute finding. Pt was seen for BSE at [facility] on 01/02/14, finding of globus, lingual fasciculations, complaint of acute onset of dysphagia. Compensatory strategies given. Pt tolerating meals.    Assessment / Plan / Recommendation Clinical Impression  Pt demonstrates adequate swallow function despite finding of mild lingual weakness and observable lingual fasciculations. Shallow rapid abdominal respiratory pattern observed during session. Pt showed no signs of aspiration with any consistency, even 3 oz water test. Pt  is independent with basic compensatory strategies for mild oral dysphagia; she takes small bites and sips, positions herself correctly for meals, follows bites with sips, etc. SLP reinforced these behaviors verbally and stressed impact of respiratory function on swallowing and benefit of slow rate. Overall, pt is functional, able to consume her meals and denies any significant dysphagia at baseline, only complaining of biting her tongue. No acute interventions are recommended. Depending on neurologic w/u and findings, pt may benefit from outpatient SLP therapy referral for speech and pragmatics. Continue current diet (soft/dysphagia 3-mech soft), will sign off.  SLP Visit Diagnosis: Dysphagia, oral phase (R13.11)    Aspiration Risk  Mild aspiration risk    Diet Recommendation Dysphagia 3 (Mech soft);Thin liquid   Liquid Administration via: Cup;Straw Medication Administration: Whole meds with liquid Supervision: Patient able to self feed Compensations: Minimize environmental distractions;Slow rate;Small sips/bites;Follow solids with liquid Postural Changes: Seated upright at 90 degrees    Other  Recommendations Oral Care Recommendations: Patient independent with oral care   Follow up Recommendations Outpatient SLP      Frequency and Duration            Prognosis        Swallow Study   General HPI: Pt is a 71 year old female admitted with a fall, progressve right lower extremity weakness, riht greater than left, shortness of breath and abnormal breathing pattern over 4 months. Pt has a complaint of dysphagia over 2 years, repetitive movements of the muscles of the floor of the mouth, significant psychomotor retardation (per family  used to be quite animated) and has been on antipsychotics for years.  Tardive dyskinesia suspected by MD vs Parkinsons. MRI shows chronic microvascular ischemia, no acute finding. Pt was seen for BSE at [facility] on 01/02/14, finding of globus, lingual fasciculations,  complaint of acute onset of dysphagia. Compensatory strategies given. Pt tolerating meals.  Type of Study: Bedside Swallow Evaluation Previous Swallow Assessment: see impression Diet Prior to this Study: Dysphagia 3 (soft);Thin liquids Temperature Spikes Noted: No History of Recent Intubation: No Behavior/Cognition: Alert;Cooperative;Pleasant mood Oral Cavity Assessment: Other (comment) (dry lips) Oral Care Completed by SLP: No Oral Cavity - Dentition: Adequate natural dentition Vision: Functional for self-feeding Self-Feeding Abilities: Able to feed self Patient Positioning: Upright in bed Baseline Vocal Quality: Normal    Oral/Motor/Sensory Function Overall Oral Motor/Sensory Function: Generalized oral weakness Facial ROM: Within Functional Limits Facial Symmetry: Within Functional Limits Facial Strength: Within Functional Limits Facial Sensation: Within Functional Limits Lingual ROM: Reduced left Lingual Symmetry: Within Functional Limits Lingual Strength: Reduced Velum: Within Functional Limits Mandible: Within Functional Limits   Ice Chips Ice chips: Not tested   Thin Liquid Thin Liquid: Within functional limits Presentation: Cup;Straw    Nectar Thick Nectar Thick Liquid: Not tested   Honey Thick Honey Thick Liquid: Not tested   Puree Puree: Within functional limits Presentation: Self Fed;Spoon   Solid   GO   Solid: Within functional limits Presentation: Self Fed    Functional Assessment Tool Used: clinical judgement Functional Limitations: Swallowing Swallow Current Status (W5809): At least 1 percent but less than 20 percent impaired, limited or restricted Swallow Goal Status [phone]): At least 1 percent but less than 20 percent impaired, limited or restricted Swallow Discharge Status [phone]): At least 1 percent but less than 20 percent impaired, limited or restricted  [facility] MA CCC-SLP [phone]  Lynann Beaver 05/26/2017,9:46 AM

## 2017-05-26 NOTE — Consult Note (Addendum)
Reason for Consult:stenosis L4-L5 Referring Physician: Dr. [name] is an 71 y.o. female.  HPI: Judy [name] is a 71 year old individual who is hospitalized because of progressive weakness in her right leg particularly in significant pain that had evolved over a number of months. She was admitted from the emergency department [facility] and transferred to come. An MRI of her lumbar spine was completed. This demonstrates a spondylolisthesis grade 1 at L4-L5 with marked facet hypertrophy. There is severe central canal and lateral recess stenosis. She has had weakness in her legs worse on the right leg with pain there that is been disabling. I was asked to see her to evaluate this process. She does appear to have weakness in the tibialis anterior and extensor hallucis longus on the right compared to the left. There is also some weakness in the quadriceps on the right compared to the left.  Past Medical History:  Diagnosis Date  . Allergic rhinitis   . Anemia   . Anxiety   . Arthritis   . Bipolar 1 disorder ([place])   . Depression   . Diverticulosis of colon (without mention of hemorrhage) 08/25/2011   [name]  . 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 Hyperlipidemia   . Hypertension   . Obesity   . Vertigo     Past Surgical History:  Procedure Laterality Date  . COLONOSCOPY  08/25/2011   Procedure: COLONOSCOPY;  Surgeon: Landry Dyke, MD;  Location: [facility];  Service: Endoscopy;  Laterality: N/A;  . DILATION AND CURETTAGE OF UTERUS  1960's    Family History  Problem Relation Age of Onset  . Prostate cancer Brother   . Hypertension Brother   . Kidney disease Brother   . Colon cancer Brother   . Hypertension Mother   . Kidney disease Mother   . Stomach cancer Father   . Hyperlipidemia Sister   . Hypothyroidism Sister   . Stroke Brother   . Prostate cancer Brother     Social History:  reports that she has never smoked. She has never used  smokeless tobacco. She reports that she does not drink alcohol or use drugs.  Allergies: No Known Allergies  Medications: I have reviewed the patient's current medications.  Results for orders placed or performed during the hospital encounter of 05/24/17 (from the past 48 hour(s))  Brain natriuretic peptide     Status: None   Collection Time: 05/24/17  9:57 PM  Result Value Ref Range   B Natriuretic Peptide 22.2 0.0 - 100.0 pg/mL  TSH     Status: None   Collection Time: 05/24/17  9:57 PM  Result Value Ref Range   TSH 1.450 0.350 - 4.500 uIU/mL    Comment: Performed by a 3rd Generation assay with a functional sensitivity of <=0.01 uIU/mL.  CK     Status: Abnormal   Collection Time: 05/24/17 10:13 PM  Result Value Ref Range   Total CK 431 (H) 38 - 234 U/L  Sedimentation rate     Status: None   Collection Time: 05/24/17 10:13 PM  Result Value Ref Range   Sed Rate 20 0 - 22 mm/hr  CBC with Differential     Status: None   Collection Time: 05/24/17 10:13 PM  Result Value Ref Range   WBC 9.3 4.0 - 10.5 K/uL   RBC 4.43 3.87 - 5.11 MIL/uL   Hemoglobin 13.2 12.0 - 15.0 g/dL   HCT 38.0  36.0 - 46.0 %   MCV 85.8 78.0 - 100.0 fL   MCH 29.8 26.0 - 34.0 pg   MCHC 34.7 30.0 - 36.0 g/dL   RDW 15.2 11.5 - 15.5 %   Platelets 325 150 - 400 K/uL   Neutrophils Relative % 60 %   Neutro Abs 5.5 1.7 - 7.7 K/uL   Lymphocytes Relative 28 %   Lymphs Abs 2.6 0.7 - 4.0 K/uL   Monocytes Relative 11 %   Monocytes Absolute 1.0 0.1 - 1.0 K/uL   Eosinophils Relative 1 %   Eosinophils Absolute 0.1 0.0 - 0.7 K/uL   Basophils Relative 0 %   Basophils Absolute 0.0 0.0 - 0.1 K/uL  Comprehensive metabolic panel     Status: Abnormal   Collection Time: 05/24/17 10:13 PM  Result Value Ref Range   Sodium 140 135 - 145 mmol/L   Potassium 3.2 (L) 3.5 - 5.1 mmol/L   Chloride 103 101 - 111 mmol/L   CO2 27 22 - 32 mmol/L   Glucose, Bld 98 65 - 99 mg/dL   BUN 18 6 - 20 mg/dL   Creatinine, Ser 0.92 0.44 - 1.00  mg/dL   Calcium 9.6 8.9 - 10.3 mg/dL   Total Protein 7.5 6.5 - 8.1 g/dL   Albumin 4.0 3.5 - 5.0 g/dL   AST 30 15 - 41 U/L   ALT 28 14 - 54 U/L   Alkaline Phosphatase 74 38 - 126 U/L   Total Bilirubin 0.4 0.3 - 1.2 mg/dL   GFR calc non Af Amer >60 >60 mL/min   GFR calc Af Amer >60 >60 mL/min    Comment: (NOTE) The eGFR has been calculated using the CKD EPI equation. This calculation has not been validated in all clinical situations. eGFR's persistently <60 mL/min signify possible Chronic Kidney Disease.    Anion gap 10 5 - 15  I-Stat Troponin, ED (not at [facility])     Status: None   Collection Time: 05/24/17 10:14 PM  Result Value Ref Range   Troponin i, poc 0.00 0.00 - 0.08 ng/mL   Comment 3            Comment: Due to the release kinetics of cTnI, a negative result within the first hours of the onset of symptoms does not rule out myocardial infarction with certainty. If myocardial infarction is still suspected, repeat the test at appropriate intervals.   Mpo/pr-3 (anca) antibodies     Status: None   Collection Time: 05/25/17 10:02 AM  Result Value Ref Range   Myeloperoxidase Abs <9.0 0.0 - 9.0 U/mL   ANCA Proteinase 3 <3.5 0.0 - 3.5 U/mL    Comment: (NOTE) Performed At: [facility] [place], [place] [id number] Lindon Romp MD [id number]   Angiotensin converting enzyme     Status: None   Collection Time: 05/25/17 10:02 AM  Result Value Ref Range   Angiotensin-Converting Enzyme 17 14 - 82 U/L    Comment: (NOTE) Performed At: [facility] [address] [place], [place] [id number] Lindon Romp MD [id number]   ANA w/Reflex if Positive     Status: None   Collection Time: 05/25/17 10:02 AM  Result Value Ref Range   Anit Nuclear Antibody(ANA) Negative Negative    Comment: (NOTE) Performed At: [facility] [address] [place], [place] [id number] Lindon Romp MD [id number]   Rheumatoid factor     Status: None    Collection Time: 05/25/17 10:02 AM  Result Value Ref Range   Rhuematoid fact SerPl-aCnc <10.0 0.0 - 13.9 IU/mL    Comment: (NOTE) Performed At: [facility] [place], [place] [id number] Lindon Romp MD [id number]   [place] extractable nuclear antibody     Status: None   Collection Time: 05/25/17 10:02 AM  Result Value Ref Range   SSA (Ro) (ENA) Antibody, IgG <0.2 0.0 - 0.9 AI    Comment: (NOTE) Performed At: [facility] [place], [place] [id number] Lindon Romp MD [id number]   DXAJOINO syndrome-B extractable nuclear antibody     Status: None   Collection Time: 05/25/17 10:02 AM  Result Value Ref Range   SSB (La) (ENA) Antibody, IgG <0.2 0.0 - 0.9 AI    Comment: (NOTE) Performed At: [facility] [place], [place] [id number] Lindon Romp MD [id number]   CBC with Differential/Platelet     Status: None   Collection Time: 05/26/17  8:33 AM  Result Value Ref Range   WBC 7.2 4.0 - 10.5 K/uL   RBC 4.39 3.87 - 5.11 MIL/uL   Hemoglobin 12.7 12.0 - 15.0 g/dL   HCT 38.0 36.0 - 46.0 %   MCV 86.6 78.0 - 100.0 fL   MCH 28.9 26.0 - 34.0 pg   MCHC 33.4 30.0 - 36.0 g/dL   RDW 15.4 11.5 - 15.5 %   Platelets 326 150 - 400 K/uL   Neutrophils Relative % 53 %   Neutro Abs 3.8 1.7 - 7.7 K/uL   Lymphocytes Relative 35 %   Lymphs Abs 2.6 0.7 - 4.0 K/uL   Monocytes Relative 9 %   Monocytes Absolute 0.7 0.1 - 1.0 K/uL   Eosinophils Relative 3 %   Eosinophils Absolute 0.2 0.0 - 0.7 K/uL   Basophils Relative 0 %   Basophils Absolute 0.0 0.0 - 0.1 K/uL  Comprehensive metabolic panel     Status: Abnormal   Collection Time: 05/26/17  8:33 AM  Result Value Ref Range   Sodium 140 135 - 145 mmol/L   Potassium 3.5 3.5 - 5.1 mmol/L   Chloride 105 101 - 111 mmol/L   CO2 22 22 - 32 mmol/L   Glucose, Bld 120 (H) 65 - 99 mg/dL   BUN 8 6 - 20 mg/dL   Creatinine, Ser 0.84 0.44 - 1.00 mg/dL   Calcium 9.1  8.9 - 10.3 mg/dL   Total Protein 6.7 6.5 - 8.1 g/dL   Albumin 3.5 3.5 - 5.0 g/dL   AST 32 15 - 41 U/L   ALT 30 14 - 54 U/L   Alkaline Phosphatase 73 38 - 126 U/L   Total Bilirubin 0.6 0.3 - 1.2 mg/dL   GFR calc non Af Amer >60 >60 mL/min   GFR calc Af Amer >60 >60 mL/min    Comment: (NOTE) The eGFR has been calculated using the CKD EPI equation. This calculation has not been validated in all clinical situations. eGFR's persistently <60 mL/min signify possible Chronic Kidney Disease.    Anion gap 13 5 - 15  Magnesium     Status: None   Collection Time: 05/26/17  8:33 AM  Result Value Ref Range   Magnesium 2.0 1.7 - 2.4 mg/dL  Phosphorus     Status: None   Collection Time: 05/26/17  8:33 AM  Result Value Ref Range   Phosphorus 3.1 2.5 - 4.6 mg/dL    Dg Chest 2 View  Result Date: 05/24/2017 CLINICAL DATA:  Pt  states being short of breath and falling earlier today, pt states both thighs are sore due to injections. Pt states she has had SOB since Sunday. Pt has no other chest complaints at this time. Pt HX: asthma, HTN EXAM: CHEST  2 VIEW COMPARISON:  05/15/2017 FINDINGS: Tortuous thoracic aorta. Mild enlargement of the cardiopericardial silhouette, without edema. Lungs appear clear. Thoracic spondylosis and kyphosis. No pleural effusion. IMPRESSION: 1. Mild cardiomegaly, without edema.  Tortuous thoracic aorta. 2. Thoracic kyphosis and spondylosis. Electronically Signed   By: Gaylyn Rong M.D.   On: 05/24/2017 21:15   Dg Knee 2 Views Right  Result Date: 05/24/2017 CLINICAL DATA:  Larey Seat 1 month ago.  Persistent weakness. EXAM: RIGHT KNEE - 1-2 VIEW COMPARISON:  RIGHT knee radiograph July 23, 2016 FINDINGS: No acute fracture deformity or dislocation. Similar tibial spine peaking. Mild-to-moderate patellofemoral compartment narrowing with periarticular sclerosis and marginal spurring compatible with osteoarthrosis. Mild mediolateral compartment marginal spurring. Similar  calcification along the medial femoral epicondyles seen with old ligamentous injury. No destructive bony lesions. Soft tissue planes are nonsuspicious. IMPRESSION: Stable degenerative change of the knee without acute osseous process. Electronically Signed   By: Awilda Metro M.D.   On: 05/24/2017 23:49   Ct Head Wo Contrast  Result Date: 05/24/2017 CLINICAL DATA:  Status post fall, with progressive right leg weakness, acute onset. Initial encounter. EXAM: CT HEAD WITHOUT CONTRAST TECHNIQUE: Contiguous axial images were obtained from the base of the skull through the vertex without intravenous contrast. COMPARISON:  CT of the head performed 12/10/2013, and MRI of the brain performed 03/06/2014 FINDINGS: Brain: No evidence of acute infarction, hemorrhage, hydrocephalus, extra-axial collection or mass lesion/mass effect. Scattered periventricular and subcortical white matter change likely reflects small vessel ischemic microangiopathy. The posterior fossa, including the cerebellum, brainstem and fourth ventricle, is within normal limits. The third and lateral ventricles, and basal ganglia are unremarkable in appearance. The cerebral hemispheres are symmetric in appearance, with normal gray-white differentiation. No mass effect or midline shift is seen. Vascular: No hyperdense vessel or unexpected calcification. Skull: There is no evidence of fracture; visualized osseous structures are unremarkable in appearance. Sinuses/Orbits: The orbits are within normal limits. The paranasal sinuses and mastoid air cells are well-aerated. Other: No significant soft tissue abnormalities are seen. IMPRESSION: 1. No evidence of traumatic intracranial injury or fracture. 2. Scattered small vessel ischemic microangiopathy. Electronically Signed   By: Roanna Raider M.D.   On: 05/24/2017 22:43   Mr Laqueta Jean TH Contrast  Result Date: 05/25/2017 CLINICAL DATA:  Fall with right leg weakness.  Confusion. EXAM: MRI HEAD WITHOUT AND  WITH CONTRAST TECHNIQUE: Multiplanar, multiecho pulse sequences of the brain and surrounding structures were obtained without and with intravenous contrast. CONTRAST:  20 mL MultiHance COMPARISON:  Head CT 05/24/2017 FINDINGS: Brain: The midline structures are normal. There is no focal diffusion restriction to indicate acute infarct. There is beginning confluent hyperintense T2-weighted signal within the periventricular and deep white matter, most often seen in the setting of chronic microvascular ischemia. No intraparenchymal hematoma or chronic microhemorrhage. Brain volume is normal for age without age-advanced or lobar predominant atrophy. The dura is normal and there is no extra-axial collection. Vascular: Major intracranial arterial and venous sinus flow voids are preserved. Skull and upper cervical spine: The visualized skull base, calvarium, upper cervical spine and extracranial soft tissues are normal. Sinuses/Orbits: No fluid levels or advanced mucosal thickening. No mastoid or middle ear effusion. Normal orbits. IMPRESSION: 1. No acute abnormality. 2. Advanced findings  of chronic microvascular ischemia. 3. No age advanced or lobar predominant atrophy. Electronically Signed   By: Ulyses Jarred M.D.   On: 05/25/2017 14:54   Mr Lumbar Spine W Wo Contrast  Result Date: 05/25/2017 CLINICAL DATA:  Progressive right leg weakness with acute onset after a fall. EXAM: MRI LUMBAR SPINE WITHOUT AND WITH CONTRAST TECHNIQUE: Multiplanar and multiecho pulse sequences of the lumbar spine were obtained without and with intravenous contrast. CONTRAST:  65m MULTIHANCE GADOBENATE DIMEGLUMINE 529 MG/ML IV SOLN COMPARISON:  Report of lumbar MRI dated 09/21/2001 FINDINGS: Segmentation:  Standard. Alignment:  Grade 1 spondylolisthesis at L4-5, 4 mm. Vertebrae:  No fracture, evidence of discitis, or bone lesion. Conus medullaris: Extends to the L1-2 level and appears normal. Paraspinal and other soft tissues: Negative. Disc  levels: L1-2:  Normal. L2-3: Normal disc. Slight hypertrophy of the ligamentum flavum facet joints. L3-4: Minimal disc bulges into the neural foramina without neural impingement. Slight hypertrophy of the ligamentum flavum facet joints. No neural impingement. L4-5: Severe spinal stenosis due to hypertrophy of the ligamentum flavum and facet joints, congenital short pedicles, and grade 1 spondylolisthesis with a small broad-based bulge of the uncovered disc. Disc degeneration with a vacuum phenomena and. There is enhancement of the thecal sac at the site of maximum compression, after contrast administration. There are bilateral joint effusions. L5-S1: Disc desiccation with a vacuum phenomena and. No significant disc bulge or protrusion. Neural foramina are widely patent. No significant abnormality of the facet joints. IMPRESSION: Severe spinal stenosis at L4-5 due to a combination of hypertrophied facet joints and grade 1 spondylolisthesis. Abnormal enhancement of the thecal sac and nerve roots at the site of maximum depression, best seen on image 23 of series 9 and image 7 of series 8. Electronically Signed   By: JLorriane ShireM.D.   On: 05/25/2017 14:52    Review of Systems  Musculoskeletal:       Leg pain from the buttock down to the posterior thigh and ankle and foot on the right side only.  Neurological: Positive for tingling, sensory change, focal weakness and weakness. Seizures: and extensor hallucis longus.  Psychiatric/Behavioral:       History of bipolar disorder.   Blood pressure (!) 154/77, pulse 73, temperature 98.8 F (37.1 C), temperature source Oral, resp. rate 20, height '5\' 9"'$  (1.753 m), weight 110.4 kg (243 lb 6.2 oz), SpO2 96 %. Physical Exam  Constitutional: She is oriented to person, place, and time. She appears well-developed and well-nourished.  obese  HENT:  Head: Normocephalic and atraumatic.  Eyes: Pupils are equal, round, and reactive to light. Conjunctivae and EOM are  normal.  Neck: Normal range of motion. Neck supple.  Respiratory: Effort normal and breath sounds normal.  GI: Soft. Bowel sounds are normal.  Musculoskeletal:  Positive straight leg raising on the right side at 15 negative on the left side 45 Patrick's maneuver is negative bilaterally sensation is diminished in the right anterolateral aspect of the thigh and leg.  Neurological: She is alert and oriented to person, place, and time.  Decreased sensation in the right anterolateral aspect of the thigh and the leg. Tibialis anterior and extensor hallucis longus are week to 4 minus out of 5 on the right compared to the left. Quadriceps weakness is also noted of 4 out of 5. Absent patellar and Achilles reflexes are noted.Cranial nerve examination is within the limits of normal.  Skin: Skin is warm and dry.  Psychiatric: She has a normal mood  and affect. Her behavior is normal. Judgment and thought content normal.    Assessment/Plan: Spondylolisthesis L4-L5 with weakness in the right tibialis anterior and extensor hallucis longus. Severe spinal canal stenosis L4-L5.  Plan: I discussed the situation with the patient and her brother, Judy [name], who accompanies her. I indicated that surgery would be an appropriate consideration given the fact that she has weakness and a severe stenosis. Her brother intervened and suggested that she would want to pursue a more conservative course opting for physical therapy. I noted that because of the weakness I'm concerned that physical therapy is going to have limited impact. We also discussed the consideration of an epidural injection and he would be keen on doing this for her. Likewise Judy [name] indicated that she was somewhat reluctant to consider surgery at this time however she's been in pain now for over 4 months time. I noted that her options are limited and I would not wait too long before she finds that the weakness becomes permanent. In any event I will honor their  wishes to  pursue a more conservative course for the time being. I will order an epidural steroid injection for her. If she is improved she can be discharged home or have outpatient therapies.  Charmion Hapke J 05/26/2017, 4:09 PM

## 2017-05-26 NOTE — Telephone Encounter (Signed)
Pt sister Reino Bellis, wanted to let Dr Melvyn Novas know that the patient is in [facility].  Pt went in for dyspnea and difficulty walking.  Reino Bellis is concerned that Pulmonology has not been placed on her care and wants to know if there is anything that Dr Melvyn Novas can do to have them Consult. Pt states that their family has requested this with [facility] and it has not happened yet.   Will send to Dr Melvyn Novas as Juluis Rainier.

## 2017-05-26 NOTE — Telephone Encounter (Signed)
The protocol is that the doctor at bedside makes the call as to whether a specialist is needed based on what he/she is seeing real time - I can't request this from this office but the fm can express their wishes to the nurse or doctors at bedside and this might sway them if they are on the fence about it

## 2017-05-26 NOTE — Progress Notes (Signed)
PROGRESS NOTE    Judy [name]  YKZ:[id number] DOB: 1946-05-14 DOA: 05/24/2017 PCP: [name], DO   Brief Narrative:  Patient is a 71 y.o. female with history of depression/anxiety/bipolar disorder on multiple psychiatric medications, admitted with 4 month history of progressive lower extremity weakness (right more than left), also with worsening dysphagia and some tardive dyskinesia-like symptoms. Neurology consulted, with recommendations to pursue MRI of cervical and lumbar spine, autoimmune workup has been sent-neurology also recommends that this patient be transferred to [facility] for further close neurology evaluation. Patient was transferred 05/25/17 and MRI was obtained and showed severe spinal stenosis at L4-L5 due to a combination of hypertrophied facet joints and grade 1 spondylolisthesis; In addition there was an abnormal enhancement of the thecal sac and nerve roots at the site of maximum depression. Neurosurgery Dr. Ellene Route was consulted for further evaluation and management. Patient underwent a SLP eval for worsening dysphagia and patient was placed on a Dysphagia 3 diet.   Assessment & Plan:   Principal Problem:   Tardive dyskinesia Active Problems:   Bipolar 1 disorder (HCC)   Hearing loss in left ear   Constipation   Other dysphagia   Essential hypertension, benign   Hypokalemia   Dysphagia   SOB (shortness of breath)   Fall   Bilateral leg pain  Bilateral Lower Extremity Weakness (right more than left) s/p Fall -Mildly elevated CPK, at 431 TSH within normal limits.  -Not sure if she recently had acute demyelinating syndrome, and is now left with resultant  deficits.  -Lumbosacral spine MRI showed Severe spinal stenosis at L4-5 due to a combination of hypertrophied facet joints and grade 1 spondylolisthesis. Abnormal enhancement of the thecal sac and nerve roots at the site of maximum depression, best seen on image 23 of series 9 and image 7 of series  8. -MRI of the Brain w/o Contrast showed No acute abnormality. Advanced findings of chronic microvascular ischemia.. No age advanced or lobar predominant atrophy. -Neurology recommending that we get a cervical spine MRI as well and was not ordered so will order -Neurology has ordered serological tests including RF, ANA, ACE, SSB/SSA, ANCA, TSH -Neurology also recommended to be transferred to [facility] and was transferred yesterday 05/25/17 -Neurosurgery Dr. Fidela Juneau because of Severe Spinal Stenosis at L4-L5 -C/w Gentle IVF Rehydration with 1/2 NS at 75 mL/hr  -Obtain PT/OT Eval  Tardive Dyskinesia-like symptoms and possible dysphagia:  -SLP Evaluated and Treated and Recommend Dysphagia 3 Diet (Mechanical Soft) -Obtain MRI of the Cervical Spine -Will defer further workup and treatment to neurology at this time.  History of Bipolar Disorder/Depression/Anxiety  -Currently appears Stable -Continue her usual medications with Bupropion 150 mg po Daily, Fluoxetine 60 mg po every morning, Lurasidone 120 mg po Daily qHS -C/w Benztropine 1 mg po BID  Hypertension -Controlled  -C/w Amlodipine 5 mg po Daily  Hard of Hearing -Especially in Left Ear (since birth) -Follow with PCP  Constipation/IBS -C/w Linaclotide 145 mcg po Daily prn and Bisacodyl Suppository 10 mg RC Daily prn Moderate Constipation   Hypokalemia, improved -Improved from 3.2 -> 3.5 -Continue to Monitor and Replete as Necessary -Repeat CMP in AM  DVT prophylaxis: Enoxaparin 40 mg sq q24h Code Status: FULL CODE Family Communication: Discussed with Brother at Bedside Disposition Plan: Pending Further Workup and PT Evaluation  Consultants:   SLP  Neurology Dr. Lorraine Lax  Neurosurgery Dr. Ellene Route   Procedures: MRI as below  Antimicrobials:  Anti-infectives    None  Subjective: Seen and examined at bedside and stated she was having trouble swallowing and brother states she clenches her jaw  now. No CP or SOB today. No nausea or vomiting. Complaining of Right leg pain and weakness. No other concerns or complaints at this time.   Objective: Vitals:   05/26/17 0139 05/26/17 0545 05/26/17 0944 05/26/17 1327  BP: (!) 169/84 (!) 169/71 (!) 162/76 (!) 154/77  Pulse: 60 (!) 55 (!) 58 73  Resp: 18 18 20 20   Temp: 97.9 F (36.6 C) 98.6 F (37 C) 98.5 F (36.9 C) 98.8 F (37.1 C)  TempSrc: Oral Oral Oral Oral  SpO2: 100% 100% 96% 96%  Weight:      Height:        Intake/Output Summary (Last 24 hours) at 05/26/17 1516 Last data filed at 05/26/17 1326  Gross per 24 hour  Intake              480 ml  Output                0 ml  Net              480 ml   Filed Weights   05/25/17 0402 05/25/17 1909  Weight: 107.7 kg (237 lb 6.4 oz) 110.4 kg (243 lb 6.2 oz)   Examination: Physical Exam:  Constitutional: WN/WD obese AAF in NAD and appears calm and comfortable Eyes: Lids and conjunctivae normal, sclerae anicteric  ENMT: External Ears, Nose appear normal. Slightly hard of hearing. Mucous membranes are moist. Clenching jaw when speaking Neck: Appears normal, supple, no cervical masses, normal ROM, no appreciable thyromegaly; no JVD Respiratory: Diminished to auscultation bilaterally, no wheezing, rales, rhonchi or crackles. Normal respiratory effort and patient is not tachypenic. No accessory muscle use.  Cardiovascular: RRR, no murmurs / rubs / gallops. S1 and S2 auscultated. No extremity edema.  Abdomen: Soft, non-tender, non-distended. No masses palpated. No appreciable hepatosplenomegaly. Bowel sounds positive x4.  GU: Deferred. Musculoskeletal: No clubbing / cyanosis of digits/nails. No joint deformity upper and lower extremities. Lower extremity weakness appreciated with Right worse than Left Skin: No rashes, lesions, ulcers on a limited skin eval. No induration; Warm and dry.  Neurologic: CN 2-12 grossly intact with no focal deficits. Sensation intact in all 4 Extremities,  DTR normal. Strength 4/5 in R Lower Extremity. Romberg sign cerebellar reflexes not assessed.  Psychiatric: Normal judgment and insight. Alert and oriented x 3. Depressed mood and Flat affect.   Data Reviewed: I have personally reviewed following labs and imaging studies  CBC:  Recent Labs Lab 05/24/17 2213 05/26/17 0833  WBC 9.3 7.2  NEUTROABS 5.5 3.8  HGB 13.2 12.7  HCT 38.0 38.0  MCV 85.8 86.6  PLT 325 102   Basic Metabolic Panel:  Recent Labs Lab 05/24/17 2213 05/26/17 0833  NA 140 140  K 3.2* 3.5  CL 103 105  CO2 27 22  GLUCOSE 98 120*  BUN 18 8  CREATININE 0.92 0.84  CALCIUM 9.6 9.1  MG  --  2.0  PHOS  --  3.1   GFR: Estimated Creatinine Clearance: 83.7 mL/min (by C-G formula based on SCr of 0.84 mg/dL). Liver Function Tests:  Recent Labs Lab 05/24/17 2213 05/26/17 0833  AST 30 32  ALT 28 30  ALKPHOS 74 73  BILITOT 0.4 0.6  PROT 7.5 6.7  ALBUMIN 4.0 3.5   No results for input(s): LIPASE, AMYLASE in the last 168 hours. No results for input(s): AMMONIA in the  last 168 hours. Coagulation Profile: No results for input(s): INR, PROTIME in the last 168 hours. Cardiac Enzymes:  Recent Labs Lab 05/24/17 2213  CKTOTAL 431*   BNP (last 3 results)  Recent Labs  04/15/17 1703  PROBNP 23.0   HbA1C: No results for input(s): HGBA1C in the last 72 hours. CBG: No results for input(s): GLUCAP in the last 168 hours. Lipid Profile: No results for input(s): CHOL, HDL, LDLCALC, TRIG, CHOLHDL, LDLDIRECT in the last 72 hours. Thyroid Function Tests:  Recent Labs  05/24/17 2157  TSH 1.450   Anemia Panel: No results for input(s): VITAMINB12, FOLATE, FERRITIN, TIBC, IRON, RETICCTPCT in the last 72 hours. Sepsis Labs: No results for input(s): PROCALCITON, LATICACIDVEN in the last 168 hours.  No results found for this or any previous visit (from the past 240 hour(s)).   Radiology Studies: Dg Chest 2 View  Result Date: 05/24/2017 CLINICAL DATA:  Pt  states being short of breath and falling earlier today, pt states both thighs are sore due to injections. Pt states she has had SOB since Sunday. Pt has no other chest complaints at this time. Pt HX: asthma, HTN EXAM: CHEST  2 VIEW COMPARISON:  05/15/2017 FINDINGS: Tortuous thoracic aorta. Mild enlargement of the cardiopericardial silhouette, without edema. Lungs appear clear. Thoracic spondylosis and kyphosis. No pleural effusion. IMPRESSION: 1. Mild cardiomegaly, without edema.  Tortuous thoracic aorta. 2. Thoracic kyphosis and spondylosis. Electronically Signed   By: Van Clines M.D.   On: 05/24/2017 21:15   Dg Knee 2 Views Right  Result Date: 05/24/2017 CLINICAL DATA:  [place] 1 month ago.  Persistent weakness. EXAM: RIGHT KNEE - 1-2 VIEW COMPARISON:  RIGHT knee radiograph July 23, 2016 FINDINGS: No acute fracture deformity or dislocation. Similar tibial spine peaking. Mild-to-moderate patellofemoral compartment narrowing with periarticular sclerosis and marginal spurring compatible with osteoarthrosis. Mild mediolateral compartment marginal spurring. Similar calcification along the medial femoral epicondyles seen with old ligamentous injury. No destructive bony lesions. Soft tissue planes are nonsuspicious. IMPRESSION: Stable degenerative change of the knee without acute osseous process. Electronically Signed   By: Elon Alas M.D.   On: 05/24/2017 23:49   Ct Head Wo Contrast  Result Date: 05/24/2017 CLINICAL DATA:  Status post fall, with progressive right leg weakness, acute onset. Initial encounter. EXAM: CT HEAD WITHOUT CONTRAST TECHNIQUE: Contiguous axial images were obtained from the base of the skull through the vertex without intravenous contrast. COMPARISON:  CT of the head performed 12/10/2013, and MRI of the brain performed 03/06/2014 FINDINGS: Brain: No evidence of acute infarction, hemorrhage, hydrocephalus, extra-axial collection or mass lesion/mass effect. Scattered  periventricular and subcortical white matter change likely reflects small vessel ischemic microangiopathy. The posterior fossa, including the cerebellum, brainstem and fourth ventricle, is within normal limits. The third and lateral ventricles, and basal ganglia are unremarkable in appearance. The cerebral hemispheres are symmetric in appearance, with normal gray-white differentiation. No mass effect or midline shift is seen. Vascular: No hyperdense vessel or unexpected calcification. Skull: There is no evidence of fracture; visualized osseous structures are unremarkable in appearance. Sinuses/Orbits: The orbits are within normal limits. The paranasal sinuses and mastoid air cells are well-aerated. Other: No significant soft tissue abnormalities are seen. IMPRESSION: 1. No evidence of traumatic intracranial injury or fracture. 2. Scattered small vessel ischemic microangiopathy. Electronically Signed   By: Garald Balding M.D.   On: 05/24/2017 22:43   Mr Jeri Cos FI Contrast  Result Date: 05/25/2017 CLINICAL DATA:  Fall with right leg weakness.  Confusion.  EXAM: MRI HEAD WITHOUT AND WITH CONTRAST TECHNIQUE: Multiplanar, multiecho pulse sequences of the brain and surrounding structures were obtained without and with intravenous contrast. CONTRAST:  20 mL MultiHance COMPARISON:  Head CT 05/24/2017 FINDINGS: Brain: The midline structures are normal. There is no focal diffusion restriction to indicate acute infarct. There is beginning confluent hyperintense T2-weighted signal within the periventricular and deep white matter, most often seen in the setting of chronic microvascular ischemia. No intraparenchymal hematoma or chronic microhemorrhage. Brain volume is normal for age without age-advanced or lobar predominant atrophy. The dura is normal and there is no extra-axial collection. Vascular: Major intracranial arterial and venous sinus flow voids are preserved. Skull and upper cervical spine: The visualized skull  base, calvarium, upper cervical spine and extracranial soft tissues are normal. Sinuses/Orbits: No fluid levels or advanced mucosal thickening. No mastoid or middle ear effusion. Normal orbits. IMPRESSION: 1. No acute abnormality. 2. Advanced findings of chronic microvascular ischemia. 3. No age advanced or lobar predominant atrophy. Electronically Signed   By: Ulyses Jarred M.D.   On: 05/25/2017 14:54   Mr Lumbar Spine W Wo Contrast  Result Date: 05/25/2017 CLINICAL DATA:  Progressive right leg weakness with acute onset after a fall. EXAM: MRI LUMBAR SPINE WITHOUT AND WITH CONTRAST TECHNIQUE: Multiplanar and multiecho pulse sequences of the lumbar spine were obtained without and with intravenous contrast. CONTRAST:  50mL MULTIHANCE GADOBENATE DIMEGLUMINE 529 MG/ML IV SOLN COMPARISON:  Report of lumbar MRI dated 09/21/2001 FINDINGS: Segmentation:  Standard. Alignment:  Grade 1 spondylolisthesis at L4-5, 4 mm. Vertebrae:  No fracture, evidence of discitis, or bone lesion. Conus medullaris: Extends to the L1-2 level and appears normal. Paraspinal and other soft tissues: Negative. Disc levels: L1-2:  Normal. L2-3: Normal disc. Slight hypertrophy of the ligamentum flavum facet joints. L3-4: Minimal disc bulges into the neural foramina without neural impingement. Slight hypertrophy of the ligamentum flavum facet joints. No neural impingement. L4-5: Severe spinal stenosis due to hypertrophy of the ligamentum flavum and facet joints, congenital short pedicles, and grade 1 spondylolisthesis with a small broad-based bulge of the uncovered disc. Disc degeneration with a vacuum phenomena and. There is enhancement of the thecal sac at the site of maximum compression, after contrast administration. There are bilateral joint effusions. L5-S1: Disc desiccation with a vacuum phenomena and. No significant disc bulge or protrusion. Neural foramina are widely patent. No significant abnormality of the facet joints. IMPRESSION:  Severe spinal stenosis at L4-5 due to a combination of hypertrophied facet joints and grade 1 spondylolisthesis. Abnormal enhancement of the thecal sac and nerve roots at the site of maximum depression, best seen on image 23 of series 9 and image 7 of series 8. Electronically Signed   By: Lorriane Shire M.D.   On: 05/25/2017 14:52   Scheduled Meds: . amLODipine  5 mg Oral Daily  . benztropine  1 mg Oral BID  . buPROPion  150 mg Oral q morning - 10a  . enoxaparin (LOVENOX) injection  40 mg Subcutaneous Q24H  . FLUoxetine  60 mg Oral q morning - 10a  . lurasidone  120 mg Oral QHS  . pantoprazole  40 mg Oral Daily   Continuous Infusions: . sodium chloride 500 mL (05/26/17 1024)    LOS: 0 days   Kerney Elbe, DO [facility] Pager [phone]  If 7PM-7AM, please contact night-coverage [email or web address] Password [facility] 05/26/2017, 3:16 PM

## 2017-05-26 NOTE — Telephone Encounter (Signed)
Spoke with pt's sister, Harriett. She is not listed on the pt's DPR, so no medical information was released to her. Harriett is aware of MW's response. Nothing further was needed.

## 2017-05-26 NOTE — Progress Notes (Addendum)
Reason for consult: Fall, Right leg weakness x 4 months  The patient is a 71 year old Judy [name] who was admitted for a fall and due to progressive right leg weakness that has been occurring over the last 4 months.   Examination  Vital signs in last 24 hours: Temp:  [97.9 F (36.6 C)-98.8 F (37.1 C)] 98.5 F (36.9 C) (08/16 1747) Pulse Rate:  [55-75] 75 (08/16 1747) Resp:  [16-20] 16 (08/16 1747) BP: (154-171)/(71-84) 155/74 (08/16 1747) SpO2:  [96 %-100 %] 99 % (08/16 1747)  General: Not in distress, cooperative CVS: pulse-normal rate and rhythm RS: breathing comfortably Extremities: normal   Neuro: MS: Alert, oriented, follows commands CN: pupils equal and reactive,  EOMI, face symmetric, tongue midline, normal sensation over face, Motor: Bilateral upper extremities 5/5. Bilateral hip flexion 4/5. Bilateral knee extension and flexion 5-/5, bilateral dorsiflexion and plantarflexion is 5/5 . It should be noted that patient would give minimal effort at times and other times pain was a factor in the effort that she gave  Sensory:unable to get a reliable sensory exam as patient would fall asleep. Reflexes: 2+ and symmetric throughout upper extremities with no lower extremity reflexes  Plantars: Right: downgoing                                Left: downgoing Cerebellar: normal finger-to-nose,mild postural tremors of both hands Gait: not tested  Motor: Right : Upper extremity    Left:     Upper extremity 5/5 deltoid       5/5 deltoid 5/5 tricep      5/5 tricep 5/5 biceps      5/5 biceps  5/5wrist flexion     5/5 wrist flexion 5/5 wrist extension     5/5 wrist extension 5/5 hand grip      5/5 hand grip  Lower extremity     Lower extremity 3/5 hip flexor      5/5 hip flexor 3/5 hip adductors     5/5 hip adductors 3/5 hip abductors     5/5 hip abductors 3/5 quadricep      5/5 quadriceps  4/5 hamstrings     5/5 hamstrings 4/5 plantar flexion       5/5 plantar flexion 3/5 plantar  extension     5/5 plantar extension  Tone and bulk:normal tone throughout; no atrophy noted Sensory: Pinprick and light touch intact throughout, bilaterally Deep Tendon Reflexes:  Right: Upper Extremity   Left: Upper extremity   biceps (C-5 to C-6) 2/4   biceps (C-5 to C-6) 2/4 tricep (C7) 2/4    triceps (C7) 2/4 Brachioradialis (C6) 2/4  Brachioradialis (C6) 2/4  Lower Extremity Lower Extremity  quadriceps (L-2 to L-4) 1/4   quadriceps (L-2 to L-4) 1/4 Achilles (S1) 1/4   Achilles (S1) 1/4 Plantars: Right: downgoing   Left: downgoing Cerebellar: normal finger-to-nose, normal rapid alternating movements and normal heel-to-shin test GAIT: Not tested   Basic Metabolic Panel:  Recent Labs Lab 05/24/17 2213 05/26/17 0833  NA 140 140  K 3.2* 3.5  CL 103 105  CO2 27 22  GLUCOSE 98 120*  BUN 18 8  CREATININE 0.92 0.84  CALCIUM 9.6 9.1  MG  --  2.0  PHOS  --  3.1    CBC:  Recent Labs Lab 05/24/17 2213 05/26/17 0833  WBC 9.3 7.2  NEUTROABS 5.5 3.8  HGB 13.2 12.7  HCT Judy.0 Judy.0  MCV 85.8  86.6  PLT 325 326     Coagulation Studies: No results for input(s): LABPROT, INR in the last 72 hours.    ASSESSMENT AND PLAN  CLINICAL DATA:  Progressive right leg weakness with acute onset after a fall.  EXAM: MRI LUMBAR SPINE WITHOUT AND WITH CONTRAST  TECHNIQUE: Multiplanar and multiecho pulse sequences of the lumbar spine were obtained without and with intravenous contrast.  CONTRAST:  69mL MULTIHANCE GADOBENATE DIMEGLUMINE 529 MG/ML IV SOLN  COMPARISON:  Report of lumbar MRI dated 09/21/2001  FINDINGS: Segmentation:  Standard.  Alignment:  Grade 1 spondylolisthesis at L4-5, 4 mm.  Vertebrae:  No fracture, evidence of discitis, or bone lesion.  Conus medullaris: Extends to the L1-2 level and appears normal.  Paraspinal and other soft tissues: Negative.  Disc levels:  L1-2:  Normal.  L2-3: Normal disc. Slight hypertrophy of the ligamentum  flavum facet joints.  L3-4: Minimal disc bulges into the neural foramina without neural impingement. Slight hypertrophy of the ligamentum flavum facet joints. No neural impingement.  L4-5: Severe spinal stenosis due to hypertrophy of the ligamentum flavum and facet joints, congenital short pedicles, and grade 1 spondylolisthesis with a small broad-based bulge of the uncovered disc. Disc degeneration with a vacuum phenomena and. There is enhancement of the thecal sac at the site of maximum compression, after contrast administration. There are bilateral joint effusions.  L5-S1: Disc desiccation with a vacuum phenomena and. No significant disc bulge or protrusion. Neural foramina are widely patent. No significant abnormality of the facet joints.  IMPRESSION: Severe spinal stenosis at L4-5 due to a combination of hypertrophied facet joints and grade 1 spondylolisthesis. Abnormal enhancement of the thecal sac and nerve roots at the site of maximum depression, best seen on image 23 of series 9 and image 7 of series 8.   ASSESSMENT AND PLAN  71 year old Judy [name] with right leg weakness secondary due to severe lumbar spinal stenosis. Patient also involuntary facial dyskinesia and tremors likely secondary to psychiatric medications.   MRI brain ; no acute abnormality MRI L spine:  showed Severe spinal stenosis at L4-5 due to a combination of hypertrophied facet joints and grade 1 spondylolisthesis. Abnormal enhancement of the thecal sac and nerve roots at the site of maximum depression, best seen on image 23 of series 9 and image 7 of series 8.  Recommendation Neurosurgery consult, counselled that patient needs decompression.    Karena Addison Aileana Hodder MD [facility] [phone]  If 7pm to 7am, please call on call as listed on AMION.

## 2017-05-27 ENCOUNTER — Ambulatory Visit: Payer: Medicare HMO | Admitting: Internal Medicine

## 2017-05-27 DIAGNOSIS — R0600 Dyspnea, unspecified: Secondary | ICD-10-CM

## 2017-05-27 DIAGNOSIS — W19XXXA Unspecified fall, initial encounter: Secondary | ICD-10-CM | POA: Diagnosis not present

## 2017-05-27 DIAGNOSIS — M48061 Spinal stenosis, lumbar region without neurogenic claudication: Secondary | ICD-10-CM

## 2017-05-27 DIAGNOSIS — G2401 Drug induced subacute dyskinesia: Secondary | ICD-10-CM | POA: Diagnosis not present

## 2017-05-27 DIAGNOSIS — F319 Bipolar disorder, unspecified: Secondary | ICD-10-CM | POA: Diagnosis not present

## 2017-05-27 DIAGNOSIS — R69 Illness, unspecified: Secondary | ICD-10-CM | POA: Diagnosis not present

## 2017-05-27 DIAGNOSIS — M48062 Spinal stenosis, lumbar region with neurogenic claudication: Secondary | ICD-10-CM | POA: Diagnosis not present

## 2017-05-27 DIAGNOSIS — E876 Hypokalemia: Secondary | ICD-10-CM | POA: Diagnosis not present

## 2017-05-27 DIAGNOSIS — M79604 Pain in right leg: Secondary | ICD-10-CM | POA: Diagnosis not present

## 2017-05-27 DIAGNOSIS — H9192 Unspecified hearing loss, left ear: Secondary | ICD-10-CM | POA: Diagnosis not present

## 2017-05-27 DIAGNOSIS — I10 Essential (primary) hypertension: Secondary | ICD-10-CM | POA: Diagnosis not present

## 2017-05-27 DIAGNOSIS — M9981 Other biomechanical lesions of cervical region: Secondary | ICD-10-CM

## 2017-05-27 DIAGNOSIS — R1319 Other dysphagia: Secondary | ICD-10-CM | POA: Diagnosis not present

## 2017-05-27 DIAGNOSIS — K5900 Constipation, unspecified: Secondary | ICD-10-CM | POA: Diagnosis not present

## 2017-05-27 DIAGNOSIS — R1310 Dysphagia, unspecified: Secondary | ICD-10-CM | POA: Diagnosis not present

## 2017-05-27 LAB — CBC WITH DIFFERENTIAL/PLATELET
Basophils Absolute: 0 10*3/uL (ref 0.0–0.1)
Basophils Relative: 0 %
EOS ABS: 0.2 10*3/uL (ref 0.0–0.7)
EOS PCT: 3 %
HCT: 37.3 % (ref 36.0–46.0)
Hemoglobin: 12.4 g/dL (ref 12.0–15.0)
LYMPHS ABS: 2.6 10*3/uL (ref 0.7–4.0)
LYMPHS PCT: 34 %
MCH: 28.8 pg (ref 26.0–34.0)
MCHC: 33.2 g/dL (ref 30.0–36.0)
MCV: 86.5 fL (ref 78.0–100.0)
MONO ABS: 0.8 10*3/uL (ref 0.1–1.0)
MONOS PCT: 10 %
Neutro Abs: 4.1 10*3/uL (ref 1.7–7.7)
Neutrophils Relative %: 53 %
PLATELETS: 328 10*3/uL (ref 150–400)
RBC: 4.31 MIL/uL (ref 3.87–5.11)
RDW: 15.4 % (ref 11.5–15.5)
WBC: 7.8 10*3/uL (ref 4.0–10.5)

## 2017-05-27 LAB — MAGNESIUM: MAGNESIUM: 2.1 mg/dL (ref 1.7–2.4)

## 2017-05-27 LAB — COMPREHENSIVE METABOLIC PANEL
ALT: 29 U/L (ref 14–54)
ANION GAP: 8 (ref 5–15)
AST: 27 U/L (ref 15–41)
Albumin: 3.4 g/dL — ABNORMAL LOW (ref 3.5–5.0)
Alkaline Phosphatase: 71 U/L (ref 38–126)
BUN: 9 mg/dL (ref 6–20)
CHLORIDE: 103 mmol/L (ref 101–111)
CO2: 28 mmol/L (ref 22–32)
CREATININE: 0.83 mg/dL (ref 0.44–1.00)
Calcium: 9.2 mg/dL (ref 8.9–10.3)
Glucose, Bld: 103 mg/dL — ABNORMAL HIGH (ref 65–99)
Potassium: 3.3 mmol/L — ABNORMAL LOW (ref 3.5–5.1)
Sodium: 139 mmol/L (ref 135–145)
Total Bilirubin: 0.5 mg/dL (ref 0.3–1.2)
Total Protein: 6.7 g/dL (ref 6.5–8.1)

## 2017-05-27 LAB — PHOSPHORUS: PHOSPHORUS: 3.3 mg/dL (ref 2.5–4.6)

## 2017-05-27 MED ORDER — BISACODYL 10 MG RE SUPP
10.0000 mg | Freq: Every day | RECTAL | 0 refills | Status: DC | PRN
Start: 2017-05-27 — End: 2017-07-06

## 2017-05-27 MED ORDER — POTASSIUM CHLORIDE 10 MEQ/100ML IV SOLN
10.0000 meq | INTRAVENOUS | Status: AC
  Administered 2017-05-27 (×3): 10 meq via INTRAVENOUS
  Filled 2017-05-27 (×3): qty 100

## 2017-05-27 MED ORDER — WHITE PETROLATUM GEL
Status: AC
  Administered 2017-05-27: 12:00:00
  Filled 2017-05-27: qty 1

## 2017-05-27 NOTE — Progress Notes (Signed)
CSW met with patient and patient's sister at bedside per request of RN. Patient's sister had questions and concerns about Medicaid application; CSW referred patient's sister to contacting local DSS office for answers.   Patient's sister also requested information for setting up home health services. CSW alerted RNCM to speak with patient's sister.  Laveda Abbe, [place] Clinical Social Worker [phone]

## 2017-05-27 NOTE — Discharge Summary (Signed)
Physician Discharge Summary  Judy [name] SEG:[id number] DOB: [birth year] DOA: 05/24/2017  PCP: [name], DO  Admit date: 05/24/2017 Discharge date: 05/27/2017  Admitted From: Home Disposition:  Home with [place] PT/OT/RN/Aide/SW  Recommendations for Outpatient Follow-up:  1. Follow up with PCP in 1-2 weeks 2. Follow up with Psychiatry for adjustment of Antipsychotics 3. Follow up with [facility] for Epidural Spinal Injection 4. Follow up with Dr. Ellene Route in Neurosurgery for Spinal Stenosis and Foraminal Stenosis of Cervical Spine as well 5. Follow up with Dr. Melvyn Novas for SOB 6. Please obtain CMP/CBC, Mag, Phos in one week 7. Follow up on Serological tests ordered by Neurology as an outpatient   Home Health: YES Equipment/Devices: 3-In-1  Discharge Condition: Stable  CODE STATUS: FULL CODE Diet recommendation: Heart Healthy Dysphagia 3 Diet (Mechanical Soft)  Brief/Interim Summary: Patient is a 71 y.o.femalewith history of depression/anxiety/bipolar disorder on multiple psychiatric medications, admitted with 4 month history of progressive lower extremity weakness (right more than left), also with worsening dysphagia and some tardive dyskinesia-like symptoms. Neurology consulted, with recommendations to pursue MRI of cervical and lumbar spine, autoimmune workup has been sent. Neurology also recommended that this patient be transferred to [facility] for further close neurology evaluation. Patient was transferred 05/25/17 and MRI was obtained and showed severe spinal stenosis at L4-L5 due to a combination of hypertrophied facet joints and grade 1 spondylolisthesis; In addition there was an abnormal enhancement of the thecal sac and nerve roots at the site of maximum depression. Neurosurgery Dr. Ellene Route was consulted for further evaluation and management. Patient underwent a SLP eval for worsening dysphagia and patient was placed on a Dysphagia 3 diet. Patient was offered  Surgery for her spinal stenosis but refused so conservative measures were taken. PT/OT was consulted and recommended Home Health. Dr. Ellene Route ordered patient to have an Epidural Steroid Injection however could not be done as an inpatient so patient was given number to Radiologist that does them. Patient also underwent a Cervical Spine MRI which showed severe Right Neural Foraminal stenosis at C4-C5 2/2 to Disc osteophyte complex largest in the Right subarticular recess, and also showed sever Right C5-6 Neural foraminal stenosis that will need to be evaluated in the outpatient setting. Patient was deemed medically stable to D/C Home with [place] PT/OT/RN/Aide/SW and will need to follow up with PCP, Psychiatry, Neurosurgery, [place], and Pulmonology as an outpatient.   Discharge Diagnoses:  Principal Problem:   Tardive dyskinesia Active Problems:   Bipolar 1 disorder (HCC)   Hearing loss in left ear   Constipation   Other dysphagia   Essential hypertension, benign   Hypokalemia   Dysphagia   SOB (shortness of breath)   Fall   Bilateral leg pain  Bilateral Lower Extremity Weakness (right more than left) s/p Fall -Mildly elevated CPK, at 431 TSH within normal limits.  -Not sure if she recently had acute demyelinating syndrome, and is now left with resultant deficits.  -Lumbosacral spine MRI showed Severe spinal stenosis at L4-5 due to a combination of hypertrophied facet joints and grade 1 spondylolisthesis. Abnormal enhancement of the thecal sac and nerve roots at the site of maximum depression, best seen on image 23 of series 9 and image 7 of series 8. -MRI of the Brain w/o Contrast showed No acute abnormality. Advanced findings of chronic microvascular ischemia.. No age advanced or lobar predominant atrophy. -Neurology has ordered serological tests including RF, ANA, ACE, SSB/SSA, ANCA, TSH -Neurology also recommended to be  transferred to [facility] and was transferred  yesterday 05/25/17 -Neurosurgery Dr. Fidela Juneau because of Severe Spinal Stenosis at L4-L5; Recommended Surgery but patient and family refused and wanted conservative Measures -Neurosurgery ordered Epidural Steroid Injection but could not be done inpatient so will need to be done at [place]; Patient was told to call [phone] to schedule an appointment -Continued with Gentle IVF Rehydration with 1/2 NS at 75 mL/hr  -Obtained PT/OT Eval and recommended Home Health PT/OT/RN/Aide/SW -Follow up PCP and Neurosurgery as an outpatient   Tardive Dyskinesia-like symptoms and possible dysphagia: -SLP Evaluated and Treated and Recommend Dysphagia 3 Diet (Mechanical Soft) -Obtained MRI of the Cervical Spine and showed Severe Foraminal Stenosis at C4-C5 and C5-C6 -Deferred further workup and treatment to Neurology -Patient will need to see Psychiatry in the outpatient Setting  Cervical Spine Foraminal Stenosis -Seen on MRI at the Level of C4-C5 and C5-C6 -Currently asymptomatic -Follow up with Neurosurgery for further evaluation and workup as an outpatient  History of Bipolar Disorder/Depression/Anxiety -Currently appears Stable -Continue her usual medications with Bupropion 150 mg po Daily, Fluoxetine 60 mg po every morning, Lurasidone 120 mg po Daily qHS -C/w Benztropine 1 mg po BID -Follow up with [facility] as an outpatient   Hypertension -Controlled  -C/w Amlodipine 5 mg po Daily  Hard of Hearing -Especially in Left Ear (since birth) -Follow with PCP  Constipation/IBS -C/w Linaclotide 145 mcg po Daily prn and Bisacodyl Suppository 10 mg RC Daily prn Moderate Constipation   Hypokalemia -Potassium was 3.3 this AM - Replete with IV KCl 30 mEQ - Continue to Monitor and Replete as Necessary - Repeat CMP as an outpatient  Discharge Instructions  Discharge Instructions    Ambulatory referral to Neurology    Complete by:  As directed    An  appointment is requested in approximately: 1-2 weeks for Leg Weakness   Call MD for:  difficulty breathing, headache or visual disturbances    Complete by:  As directed    Call MD for:  extreme fatigue    Complete by:  As directed    Call MD for:  hives    Complete by:  As directed    Call MD for:  persistant dizziness or light-headedness    Complete by:  As directed    Call MD for:  persistant nausea and vomiting    Complete by:  As directed    Call MD for:  redness, tenderness, or signs of infection (pain, swelling, redness, odor or green/yellow discharge around incision site)    Complete by:  As directed    Call MD for:  severe uncontrolled pain    Complete by:  As directed    Call MD for:  temperature >100.4    Complete by:  As directed    Diet - low sodium heart healthy    Complete by:  As directed    Discharge instructions    Complete by:  As directed    Follow up with PCP, Neurosurgery, [facility] for Epidural Steroid Injection, Psychiatry and Neurology as an outpatient. Take all medications as prescribed. If symptoms change or worsen please return to the ED for evaluation.   Increase activity slowly    Complete by:  As directed      Allergies as of 05/27/2017   No Known Allergies     Medication List    TAKE these medications   acetaminophen 500 MG tablet Commonly known as:  TYLENOL Take 500 mg  by mouth daily as needed for moderate pain.   amLODipine 5 MG tablet Commonly known as:  NORVASC Take 1 tablet (5 mg total) by mouth daily.   benztropine 1 MG tablet Commonly known as:  COGENTIN Take 1 tablet (1 mg total) by mouth 2 (two) times daily.   BIOFREEZE 4 % Gel Generic drug:  Menthol (Topical Analgesic) Apply 1 application topically 2 (two) times daily as needed (pain).   bisacodyl 10 MG suppository Commonly known as:  DULCOLAX Place 1 suppository (10 mg total) rectally daily as needed for moderate constipation.   buPROPion 150 MG 24 hr  tablet Commonly known as:  WELLBUTRIN XL Take 150 mg by mouth every morning. Take one tablet every morning   famotidine 20 MG tablet Commonly known as:  PEPCID One at bedtime   FLUoxetine HCl 60 MG Tabs Take 60 mg by mouth every morning.   LATUDA 120 MG Tabs Generic drug:  Lurasidone HCl Take 120 mg by mouth at bedtime. With 350 calories   linaclotide 145 MCG Caps capsule Commonly known as:  LINZESS Take 145 mcg by mouth daily as needed (constipation).   naproxen sodium 220 MG tablet Commonly known as:  ANAPROX Take 440 mg by mouth daily as needed.   pantoprazole 40 MG tablet Commonly known as:  PROTONIX Take 1 tablet (40 mg total) by mouth daily. Take 30-60 min before first meal of the day      Follow-up [place], [place], DO. Call.   Specialty:  Internal Medicine Why:  Call to schedule an appointment within 1 week Contact information: [place] [id number] [phone]        Group, [facility]. Call in 1 week(s).   Specialty:  Behavioral Health Why:  Call to make follow up appointment and discuss Antipsychotics and Tardive Dyskinesia Contact information: [address] [place] [address] [phone]        Kristeen Miss, MD. Call.   Specialty:  Neurosurgery Why:  Call to follow up with Dr. Mariam Dollar as an outpatient Contact information: [address] [address] [phone]        [facility] Follow up.   Why:  [place], [place] [address] Call: [phone] on Monday 05/30/17 to schedule Epidural Steroid Injection Contact information: [facility], Christena Deem, MD. Call.   Specialty:  Pulmonary Disease Why:  Call to follow up for SOB.  Contact information: [address] [address] [phone]          No Known Allergies  Consultations:  Neurology  Neurosurgery  Case Management  Social  Work  Procedures/Studies: Dg Chest 2 View  Result Date: 05/24/2017 CLINICAL DATA:  Pt states being short of breath and falling earlier today, pt states both thighs are sore due to injections. Pt states she has had SOB since Sunday. Pt has no other chest complaints at this time. Pt HX: asthma, HTN EXAM: CHEST  2 VIEW COMPARISON:  05/15/2017 FINDINGS: Tortuous thoracic aorta. Mild enlargement of the cardiopericardial silhouette, without edema. Lungs appear clear. Thoracic spondylosis and kyphosis. No pleural effusion. IMPRESSION: 1. Mild cardiomegaly, without edema.  Tortuous thoracic aorta. 2. Thoracic kyphosis and spondylosis. Electronically Signed   By: Van Clines M.D.   On: 05/24/2017 21:15   Dg Chest 2 View  Result Date: 05/15/2017 CLINICAL DATA:  Dyspnea x9 months EXAM: CHEST  2 VIEW  COMPARISON:  04/18/2017 FINDINGS: The heart size and mediastinal contours are within normal limits. Uncoiling of the thoracic aorta without aneurysm. Blunting of the right lateral costophrenic angle may represent a trace right effusion versus pleural thickening. Both lungs are clear. The visualized skeletal structures are stable with midthoracic kyphosis attributable to multilevel disc space narrowing and mild chronic anterior wedging of T7 and T8. IMPRESSION: No active cardiopulmonary disease. Trace right pleural effusion versus minimal pleural thickening. Electronically Signed   By: Ashley Royalty M.D.   On: 05/15/2017 20:20   Dg Knee 2 Views Right  Result Date: 05/24/2017 CLINICAL DATA:  [place] 1 month ago.  Persistent weakness. EXAM: RIGHT KNEE - 1-2 VIEW COMPARISON:  RIGHT knee radiograph July 23, 2016 FINDINGS: No acute fracture deformity or dislocation. Similar tibial spine peaking. Mild-to-moderate patellofemoral compartment narrowing with periarticular sclerosis and marginal spurring compatible with osteoarthrosis. Mild mediolateral compartment marginal spurring. Similar calcification along the medial  femoral epicondyles seen with old ligamentous injury. No destructive bony lesions. Soft tissue planes are nonsuspicious. IMPRESSION: Stable degenerative change of the knee without acute osseous process. Electronically Signed   By: Elon Alas M.D.   On: 05/24/2017 23:49   Ct Head Wo Contrast  Result Date: 05/24/2017 CLINICAL DATA:  Status post fall, with progressive right leg weakness, acute onset. Initial encounter. EXAM: CT HEAD WITHOUT CONTRAST TECHNIQUE: Contiguous axial images were obtained from the base of the skull through the vertex without intravenous contrast. COMPARISON:  CT of the head performed 12/10/2013, and MRI of the brain performed 03/06/2014 FINDINGS: Brain: No evidence of acute infarction, hemorrhage, hydrocephalus, extra-axial collection or mass lesion/mass effect. Scattered periventricular and subcortical white matter change likely reflects small vessel ischemic microangiopathy. The posterior fossa, including the cerebellum, brainstem and fourth ventricle, is within normal limits. The third and lateral ventricles, and basal ganglia are unremarkable in appearance. The cerebral hemispheres are symmetric in appearance, with normal gray-white differentiation. No mass effect or midline shift is seen. Vascular: No hyperdense vessel or unexpected calcification. Skull: There is no evidence of fracture; visualized osseous structures are unremarkable in appearance. Sinuses/Orbits: The orbits are within normal limits. The paranasal sinuses and mastoid air cells are well-aerated. Other: No significant soft tissue abnormalities are seen. IMPRESSION: 1. No evidence of traumatic intracranial injury or fracture. 2. Scattered small vessel ischemic microangiopathy. Electronically Signed   By: Garald Balding M.D.   On: 05/24/2017 22:43   Ct Soft Tissue Neck W Contrast  Result Date: 05/15/2017 CLINICAL DATA:  Throat and tongue swelling for several days. RIGHT leg pain. EXAM: CT NECK WITH CONTRAST  TECHNIQUE: Multidetector CT imaging of the neck was performed using the standard protocol following the bolus administration of intravenous contrast. CONTRAST:  75 cc ISOVUE-300 IOPAMIDOL (ISOVUE-300) INJECTION 61% COMPARISON:  MRI of the head Mar 06, 2014 FINDINGS: Moderately motion degraded examination. PHARYNX AND LARYNX: Normal.  Widely patent airway. SALIVARY GLANDS: Normal. THYROID: Mildly heterogeneous LEFT thyroid without dominant nodule. LYMPH NODES: No lymphadenopathy by CT size criteria. VASCULAR: Normal. LIMITED INTRACRANIAL: Normal. VISUALIZED ORBITS: Normal. MASTOIDS AND VISUALIZED PARANASAL SINUSES: Well-aerated. SKELETON: Nonacute. Multilevel moderate degenerative change of the cervical spine. Moderate to severe RIGHT C4-5, moderate to severe RIGHT C5-6 neural foraminal narrowing. UPPER CHEST: Lung apices are clear. No superior mediastinal lymphadenopathy. OTHER: None. IMPRESSION: No acute process in the neck on this moderately motion degraded examination. Patent airway. Electronically Signed   By: Elon Alas M.D.   On: 05/15/2017 22:24   Mr Jeri Cos MC Contrast  Result Date: 05/25/2017 CLINICAL DATA:  Fall with right leg weakness.  Confusion. EXAM: MRI HEAD WITHOUT AND WITH CONTRAST TECHNIQUE: Multiplanar, multiecho pulse sequences of the brain and surrounding structures were obtained without and with intravenous contrast. CONTRAST:  20 mL MultiHance COMPARISON:  Head CT 05/24/2017 FINDINGS: Brain: The midline structures are normal. There is no focal diffusion restriction to indicate acute infarct. There is beginning confluent hyperintense T2-weighted signal within the periventricular and deep white matter, most often seen in the setting of chronic microvascular ischemia. No intraparenchymal hematoma or chronic microhemorrhage. Brain volume is normal for age without age-advanced or lobar predominant atrophy. The dura is normal and there is no extra-axial collection. Vascular: Major  intracranial arterial and venous sinus flow voids are preserved. Skull and upper cervical spine: The visualized skull base, calvarium, upper cervical spine and extracranial soft tissues are normal. Sinuses/Orbits: No fluid levels or advanced mucosal thickening. No mastoid or middle ear effusion. Normal orbits. IMPRESSION: 1. No acute abnormality. 2. Advanced findings of chronic microvascular ischemia. 3. No age advanced or lobar predominant atrophy. Electronically Signed   By: Ulyses Jarred M.D.   On: 05/25/2017 14:54   Mr Cervical Spine W Wo Contrast  Result Date: 05/26/2017 CLINICAL DATA:  Neck pain EXAM: MRI CERVICAL SPINE WITHOUT AND WITH CONTRAST TECHNIQUE: Multiplanar and multiecho pulse sequences of the cervical spine, to include the craniocervical junction and cervicothoracic junction, were obtained without and with intravenous contrast. CONTRAST:  26mL MULTIHANCE GADOBENATE DIMEGLUMINE 529 MG/ML IV SOLN COMPARISON:  None. FINDINGS: Alignment: Normal Vertebrae: No fracture, evidence of discitis, or bone lesion. Cord: Normal signal and morphology. Posterior Fossa, vertebral arteries, paraspinal tissues: Visualized posterior fossa is normal. Vertebral artery flow voids are preserved. Normal visualized paraspinal soft tissues. Disc levels: C1-C2: Normal. C2-C3: Normal disc space and facets. No spinal canal or neuroforaminal stenosis. C3-C4: There is a disc osteophyte complex effacing the ventral thecal sac. No spinal canal stenosis. No neural foraminal stenosis. C4-C5: Disc space narrowing with medium-sized disc osteophyte complex, greater in the right subarticular zone. This causes severe right neural foraminal stenosis and moderate central spinal canal stenosis with flattening of the ventral spinal cord. Left-greater-than-right facet hypertrophy. C5-C6: Disc space narrowing and small disc osteophyte complex. No spinal canal stenosis. Severe right neural foraminal stenosis. C6-C7: Disc space narrowing with  small disc osteophyte complex. No stenosis. C7-T1: Normal disc space and facets. No spinal canal or neuroforaminal stenosis. IMPRESSION: 1. Moderate spinal canal and severe right neural foraminal stenosis at C4-C5 secondary to disc osteophyte complex, largest in the right subarticular recess. 2. Severe right C5-6 neural foraminal stenosis. Electronically Signed   By: Ulyses Jarred M.D.   On: 05/26/2017 19:16   Mr Lumbar Spine W Wo Contrast  Result Date: 05/25/2017 CLINICAL DATA:  Progressive right leg weakness with acute onset after a fall. EXAM: MRI LUMBAR SPINE WITHOUT AND WITH CONTRAST TECHNIQUE: Multiplanar and multiecho pulse sequences of the lumbar spine were obtained without and with intravenous contrast. CONTRAST:  26mL MULTIHANCE GADOBENATE DIMEGLUMINE 529 MG/ML IV SOLN COMPARISON:  Report of lumbar MRI dated 09/21/2001 FINDINGS: Segmentation:  Standard. Alignment:  Grade 1 spondylolisthesis at L4-5, 4 mm. Vertebrae:  No fracture, evidence of discitis, or bone lesion. Conus medullaris: Extends to the L1-2 level and appears normal. Paraspinal and other soft tissues: Negative. Disc levels: L1-2:  Normal. L2-3: Normal disc. Slight hypertrophy of the ligamentum flavum facet joints. L3-4: Minimal disc bulges into the neural foramina without neural impingement. Slight hypertrophy of the ligamentum  flavum facet joints. No neural impingement. L4-5: Severe spinal stenosis due to hypertrophy of the ligamentum flavum and facet joints, congenital short pedicles, and grade 1 spondylolisthesis with a small broad-based bulge of the uncovered disc. Disc degeneration with a vacuum phenomena and. There is enhancement of the thecal sac at the site of maximum compression, after contrast administration. There are bilateral joint effusions. L5-S1: Disc desiccation with a vacuum phenomena and. No significant disc bulge or protrusion. Neural foramina are widely patent. No significant abnormality of the facet joints.  IMPRESSION: Severe spinal stenosis at L4-5 due to a combination of hypertrophied facet joints and grade 1 spondylolisthesis. Abnormal enhancement of the thecal sac and nerve roots at the site of maximum depression, best seen on image 23 of series 9 and image 7 of series 8. Electronically Signed   By: Lorriane Shire M.D.   On: 05/25/2017 14:52     Subjective: Seen and examined at bedside and had no complaints. Did not want surgery. Denied any CP or SOB. No lightheadedness or dizziness. Ready to go home.  Discharge Exam: Vitals:   05/27/17 0916 05/27/17 1511  BP: (!) 162/76 (!) 161/76  Pulse: 70 64  Resp: 17 15  Temp: 98.3 F (36.8 C) 98.3 F (36.8 C)  SpO2: 94% 99%   Vitals:   05/27/17 0200 05/27/17 0549 05/27/17 0916 05/27/17 1511  BP: (!) 162/79 (!) 161/78 (!) 162/76 (!) 161/76  Pulse: 65 68 70 64  Resp: 20 18 17 15   Temp:  98.6 F (37 C) 98.3 F (36.8 C) 98.3 F (36.8 C)  TempSrc:  Oral Oral Oral  SpO2: 98% 98% 94% 99%  Weight:  106.2 kg (234 lb 2.1 oz)    Height:       General: Pt is alert, awake, not in acute distress; Had some Tardive dyskinesia with jaw clenching movements Cardiovascular: RRR, S1/S2 +, no rubs, no gallops Respiratory: CTA bilaterally, no wheezing, no rhonchi Abdominal: Soft, NT, ND, bowel sounds + Extremities: no edema, no cyanosis  The results of significant diagnostics from this hospitalization (including imaging, microbiology, ancillary and laboratory) are listed below for reference.    Microbiology: No results found for this or any previous visit (from the past 240 hour(s)).   Labs: BNP (last 3 results)  Recent Labs  05/15/17 2019 05/24/17 2157  BNP 38.7 16.1   Basic Metabolic Panel:  Recent Labs Lab 05/24/17 2213 05/26/17 0833 05/27/17 0552  NA 140 140 139  K 3.2* 3.5 3.3*  CL 103 105 103  CO2 27 22 28   GLUCOSE 98 120* 103*  BUN 18 8 9   CREATININE 0.92 0.84 0.83  CALCIUM 9.6 9.1 9.2  MG  --  2.0 2.1  PHOS  --  3.1 3.3    Liver Function Tests:  Recent Labs Lab 05/24/17 2213 05/26/17 0833 05/27/17 0552  AST 30 32 27  ALT 28 30 29   ALKPHOS 74 73 71  BILITOT 0.4 0.6 0.5  PROT 7.5 6.7 6.7  ALBUMIN 4.0 3.5 3.4*   No results for input(s): LIPASE, AMYLASE in the last 168 hours. No results for input(s): AMMONIA in the last 168 hours. CBC:  Recent Labs Lab 05/24/17 2213 05/26/17 0833 05/27/17 0552  WBC 9.3 7.2 7.8  NEUTROABS 5.5 3.8 4.1  HGB 13.2 12.7 12.4  HCT 38.0 38.0 37.3  MCV 85.8 86.6 86.5  PLT 325 326 328   Cardiac Enzymes:  Recent Labs Lab 05/24/17 2213  CKTOTAL 431*   BNP: Invalid input(s):  POCBNP CBG: No results for input(s): GLUCAP in the last 168 hours. D-Dimer No results for input(s): DDIMER in the last 72 hours. Hgb A1c No results for input(s): HGBA1C in the last 72 hours. Lipid Profile No results for input(s): CHOL, HDL, LDLCALC, TRIG, CHOLHDL, LDLDIRECT in the last 72 hours. Thyroid function studies  Recent Labs  05/24/17 2157  TSH 1.450   Anemia work up No results for input(s): VITAMINB12, FOLATE, FERRITIN, TIBC, IRON, RETICCTPCT in the last 72 hours. Urinalysis    Component Value Date/Time   COLORURINE DARK YELLOW 03/25/2017 1550   APPEARANCEUR CLEAR 03/25/2017 1550   LABSPEC 1.021 03/25/2017 1550   PHURINE 6.5 03/25/2017 1550   GLUCOSEU NEGATIVE 03/25/2017 1550   HGBUR NEGATIVE 03/25/2017 1550   BILIRUBINUR NEGATIVE 03/25/2017 1550   KETONESUR NEGATIVE 03/25/2017 1550   PROTEINUR TRACE (A) 03/25/2017 1550   UROBILINOGEN 1.0 12/10/2013 2001   NITRITE NEGATIVE 03/25/2017 1550   LEUKOCYTESUR 1+ (A) 03/25/2017 1550   Sepsis Labs Invalid input(s): PROCALCITONIN,  WBC,  LACTICIDVEN Microbiology No results found for this or any previous visit (from the past 240 hour(s)).  Time coordinating discharge: 35 minutes  SIGNED:  Kerney Elbe, DO [facility] 05/27/2017, 8:43 PM Pager [phone]  If 7PM-7AM, please contact  night-coverage [email or web address] Password [email or web address]

## 2017-05-27 NOTE — Care Management Note (Addendum)
Case Management Note  Patient Details  Name: Judy [name] MRN: [id number] Date of Birth: 05-10-1946  Subjective/Objective:                 Spoke w patient and sister [name] in room. Patient has DME rollator shower seat at home, requesting 3/1, order placed, [facility] to deliver to room today. Would like [facility] [place] RN PT OT [facility] SW first choice [facility], second choice [facility], referral placed to [facility], pending acceptance. Requested [facility] orders from Dr Alfredia Ferguson. Patient accepted by [facility].    Action/Plan:  DC to home w [place] through [facility].  Expected Discharge Date:                  Expected Discharge Plan:  [place]  In-House Referral:     Discharge planning Services  CM Consult  Post Acute Care Choice:    Choice offered to:  Sibling  DME Arranged:  3-N-1 DME Agency:  [place]:  RN, PT, OT, Nurse's Aide, Social Work [employer] Agency:     Status of Service:  In process, will continue to follow  If discussed at Long Length of Stay Meetings, dates discussed:    Additional Comments:  Carles Collet, RN 05/27/2017, 11:20 AM

## 2017-05-27 NOTE — Progress Notes (Addendum)
Patient ready for discharge to home; discharge instructions given and reviewed; patient dressed with assistance; patient discharged accompanied by her brother to home. Follow up appointments reviewed on the discharge instructions and need to be called by patient/family for her to be seen and followed for treatment.

## 2017-05-27 NOTE — Evaluation (Signed)
Physical Therapy Evaluation Patient Details Name: Judy [name] MRN: [id number] DOB: 16-Oct-1945 Today's Date: 05/27/2017   History of Present Illness  Pt is a 71 year old female admitted secondary to sustaining a fall due to a 4 month hx of right leg weakness secondary due to severe lumbar spinal stenosis. Patient also with involuntary facial dyskinesia and tremors likely secondary to psychiatric medications. PMH includes Anemia; Anxiety; Arthritis; Bipolar 1 disorder; Depression; Hearing loss in left ear; Hypertension; Obesity; and Vertigo.  Clinical Impression  Pt presented sitting OOB in recliner chair, awake and willing to participate in therapy session. Prior to admission, pt reported that she was independent with ADLs and ambulated with use of rollator. Pt's sister present throughout session. Pt performed transfers with RW and min A, and ambulated within her room with RW and min guard for safety. Pt with noticeable weakness in bilateral LEs (R>L) with consistent R foot drag. Pt would continue to benefit from skilled physical therapy services at this time while admitted and after d/c to address the below listed limitations in order to improve overall safety and independence with functional mobility.     Follow Up Recommendations Home health PT;Supervision/Assistance - 24 hour    Equipment Recommendations  3in1 (PT)    Recommendations for Other Services       Precautions / Restrictions Precautions Precautions: Fall Restrictions Weight Bearing Restrictions: No      Mobility  Bed Mobility               General bed mobility comments: pt OOB in recliner chair upon arrival  Transfers Overall transfer level: Needs assistance Equipment used: Rolling walker (2 wheeled) Transfers: Sit to/from Stand Sit to Stand: Min assist         General transfer comment: increased time and effort, cueing for bilateral hand placement, min A to power into standing from recliner and for  stability with transition  Ambulation/Gait Ambulation/Gait assistance: Min guard Ambulation Distance (Feet): 30 Feet Assistive device: Rolling walker (2 wheeled) Gait Pattern/deviations: Step-to pattern;Decreased step length - right;Decreased step length - left;Decreased stride length;Decreased dorsiflexion - right Gait velocity: decreased Gait velocity interpretation: Below normal speed for age/gender General Gait Details: pt with no foot clearance on R LE, step-to with L LE leading; pt required min guard for safety and cueing for safe use of RW  Stairs            Wheelchair Mobility    Modified Rankin (Stroke Patients Only)       Balance Overall balance assessment: Needs assistance Sitting-balance support: Feet supported Sitting balance-Leahy Scale: Fair     Standing balance support: During functional activity;Bilateral upper extremity supported Standing balance-Leahy Scale: Poor Standing balance comment: pt reliant on bilateral UEs on RW                             Pertinent Vitals/Pain Pain Assessment: Faces Faces Pain Scale: Hurts a little bit Pain Location: bilateral thighs (R>L) Pain Descriptors / Indicators: Sore Pain Intervention(s): Monitored during session;Repositioned    Home Living Family/patient expects to be discharged to:: Private residence Living Arrangements: Alone Available Help at Discharge: Family;Available 24 hours/day (initially) Type of Home: [place]: Walker - 4 wheels;Shower seat      Prior Function Level of Independence: Independent with assistive device(s)         Comments: ambulates with use of rollator  Hand Dominance        Extremity/Trunk Assessment   Upper Extremity Assessment Upper Extremity Assessment: Defer to OT evaluation    Lower Extremity Assessment Lower Extremity Assessment: Generalized weakness;RLE deficits/detail RLE Deficits / Details: pt with reports of R  anterior thigh pain; functionally during gait, pt with R foot drag       Communication   Communication: HOH  Cognition Arousal/Alertness: Awake/alert Behavior During Therapy: Flat affect Overall Cognitive Status: Within Functional Limits for tasks assessed                                        General Comments      Exercises     Assessment/Plan    PT Assessment Patient needs continued PT services  PT Problem List Decreased strength;Decreased activity tolerance;Decreased balance;Decreased mobility;Decreased coordination;Decreased knowledge of use of DME;Decreased safety awareness;Decreased knowledge of precautions;Pain       PT Treatment Interventions DME instruction;Gait training;Stair training;Functional mobility training;Therapeutic activities;Therapeutic exercise;Balance training;Neuromuscular re-education;Patient/family education    PT Goals (Current goals can be found in the Care Plan section)  Acute Rehab PT Goals Patient Stated Goal: return home PT Goal Formulation: With patient/family Time For Goal Achievement: 06/10/17 Potential to Achieve Goals: Fair    Frequency Min 3X/week   Barriers to discharge        Co-evaluation               AM-PAC PT "6 Clicks" Daily Activity  Outcome Measure Difficulty turning over in bed (including adjusting bedclothes, sheets and blankets)?: A Lot Difficulty moving from lying on back to sitting on the side of the bed? : A Lot Difficulty sitting down on and standing up from a chair with arms (e.g., wheelchair, bedside commode, etc,.)?: Unable Help needed moving to and from a bed to chair (including a wheelchair)?: A Little Help needed walking in hospital room?: A Little Help needed climbing 3-5 steps with a railing? : A Little 6 Click Score: 14    End of Session Equipment Utilized During Treatment: Gait belt Activity Tolerance: Patient limited by fatigue Patient left: in chair;with call bell/phone  within reach;with chair alarm set;with family/visitor present Nurse Communication: Mobility status PT Visit Diagnosis: Other abnormalities of gait and mobility (R26.89)    Time: 2023-3435 PT Time Calculation (min) (ACUTE ONLY): 24 min   Charges:   PT Evaluation $PT Eval Moderate Complexity: 1 Mod PT Treatments $Gait Training: 8-22 mins   PT G Codes:   PT G-Codes **NOT FOR INPATIENT CLASS** Functional Assessment Tool Used: AM-PAC 6 Clicks Basic Mobility;Clinical judgement Functional Limitation: Mobility: Walking and moving around Mobility: Walking and Moving Around Current Status (W8616): At least 40 percent but less than 60 percent impaired, limited or restricted Mobility: Walking and Moving Around Goal Status [phone]): At least 1 percent but less than 20 percent impaired, limited or restricted    [facility], [place], DPT [place] 05/27/2017, 11:29 AM

## 2017-05-27 NOTE — Evaluation (Signed)
Occupational Therapy Evaluation Patient Details Name: Judy [name] MRN: [id number] DOB: September 17, 1946 Today's Date: 05/27/2017    History of Present Illness Pt is a 71 year old female admitted secondary to sustaining a fall due to a 4 month hx of right leg weakness secondary due to severe lumbar spinal stenosis. Patient also with involuntary facial dyskinesia and tremors likely secondary to psychiatric medications. PMH includes Anemia; Anxiety; Arthritis; Bipolar 1 disorder; Depression; Hearing loss in left ear; Hypertension; Obesity; and Vertigo.   Clinical Impression   PTA Pt independent in ADL and used a RW (Rollator) for mobility. Pt is currently set up for seated ADL (Pt fatigues quickly with walking and standing). Educated Pt on energy conservation and possible AE for assist with LB ADL. Pt and sister verbally acknowledged. At this time Pt declining surgery. At this time Pt will require skilled OT in the acute setting and afterwards HHOT will be required for home safety eval and to maximize safety and independence in ADL and functional transfers.     Follow Up Recommendations  Home health OT;Supervision - Intermittent    Equipment Recommendations  3 in 1 bedside commode    Recommendations for Other Services       Precautions / Restrictions Precautions Precautions: Fall Restrictions Weight Bearing Restrictions: No      Mobility Bed Mobility               General bed mobility comments: pt received in bathroom, getting off comode  Transfers Overall transfer level: Needs assistance Equipment used: Rolling walker (2 wheeled) Transfers: Sit to/from Stand Sit to Stand: Min guard         General transfer comment: increased time and effort, cueing for bilateral hand placement, min guard for safety to power into standing  but no assist needed    Balance Overall balance assessment: Needs assistance Sitting-balance support: Feet supported Sitting balance-Leahy Scale:  Fair     Standing balance support: During functional activity;Bilateral upper extremity supported Standing balance-Leahy Scale: Poor Standing balance comment: pt reliant on bilateral UEs on RW; fatigues quickly - recommend sitting for ADL                           ADL either performed or assessed with clinical judgement   ADL Overall ADL's : Needs assistance/impaired Eating/Feeding: Modified independent;Sitting   Grooming: Set up;Sitting;Wash/dry face;Wash/dry hands;Oral care Grooming Details (indicate cue type and reason): at sink, using chair to manage pain in BLE Upper Body Bathing: Set up;Sitting   Lower Body Bathing: Set up;Sitting/lateral leans   Upper Body Dressing : Set up   Lower Body Dressing: Minimal assistance;Sitting/lateral leans Lower Body Dressing Details (indicate cue type and reason): to don socks Toilet Transfer: Min guard;RW;Ambulation;Comfort height toilet;Grab bars Toilet Transfer Details (indicate cue type and reason): OT entered as Pt was getting up from commode Toileting- Clothing Manipulation and Hygiene: Moderate assistance;Sit to/from stand   Tub/ Banker: Min guard   Functional mobility during ADLs: Min guard;Rolling walker       Vision Patient Visual Report: No change from baseline Vision Assessment?: No apparent visual deficits     Perception     Praxis      Pertinent Vitals/Pain Pain Assessment: Faces Faces Pain Scale: Hurts a little bit Pain Location: bilateral thighs (R>L), IV site Pain Descriptors / Indicators: Sore Pain Intervention(s): Monitored during session;Repositioned;Heat applied (heat on IV site)     Hand Dominance Right   Extremity/Trunk Assessment Upper  Extremity Assessment Upper Extremity Assessment: Generalized weakness   Lower Extremity Assessment Lower Extremity Assessment: Defer to PT evaluation RLE Deficits / Details: pt with reports of R anterior thigh pain; functionally during gait, pt  with R foot drag   Cervical / Trunk Assessment Cervical / Trunk Assessment: Normal (rounded shoulders and forward head)   Communication Communication Communication: HOH   Cognition Arousal/Alertness: Awake/alert Behavior During Therapy: Flat affect Overall Cognitive Status: Within Functional Limits for tasks assessed                                     General Comments  sister present for session    Exercises     Shoulder Instructions      Home Living Family/patient expects to be discharged to:: Private residence Living Arrangements: Alone Available Help at Discharge: Family;Available 24 hours/day (initially) Type of Home: Apartment       Home Layout: One level     Bathroom Shower/Tub: Teacher, early years/pre: Standard Bathroom Accessibility: Yes How Accessible: Accessible via walker Home Equipment: [place] - 4 wheels;Shower seat;Grab bars - tub/shower          Prior Functioning/Environment Level of Independence: Independent with assistive device(s)        Comments: ambulates with use of rollator        OT Problem List: Decreased strength;Decreased activity tolerance;Impaired balance (sitting and/or standing);Decreased safety awareness;Decreased knowledge of use of DME or AE;Impaired sensation;Pain      OT Treatment/Interventions: Self-care/ADL training;Energy conservation;DME and/or AE instruction;Therapeutic activities;Patient/family education;Balance training    OT Goals(Current goals can be found in the care plan section) Acute Rehab OT Goals Patient Stated Goal: return home OT Goal Formulation: With patient Time For Goal Achievement: 06/10/17 Potential to Achieve Goals: Good ADL Goals Pt Will Perform Grooming: with modified independence;sitting Pt Will Perform Upper Body Bathing: with modified independence;sitting Pt Will Perform Lower Body Bathing: with modified independence;sitting/lateral leans (using adaptive equipment  as needed) Pt Will Transfer to Toilet: with modified independence;ambulating;regular height toilet (with RW) Pt Will Perform Toileting - Clothing Manipulation and hygiene: with modified independence;sit to/from stand Additional ADL Goal #1: Pt will recall 3 ways to conserve energy during ADL with one or less verbal cues  OT Frequency: Min 2X/week   Barriers to D/C:    Pt lives alone       Co-evaluation              AM-PAC PT "6 Clicks" Daily Activity     Outcome Measure Help from another person eating meals?: None Help from another person taking care of personal grooming?: A Little Help from another person toileting, which includes using toliet, bedpan, or urinal?: A Little Help from another person bathing (including washing, rinsing, drying)?: A Little Help from another person to put on and taking off regular upper body clothing?: None Help from another person to put on and taking off regular lower body clothing?: A Little 6 Click Score: 20   End of Session Equipment Utilized During Treatment: Gait belt;Rolling walker Nurse Communication: Mobility status;Other (comment) (IV site painful)  Activity Tolerance: Patient tolerated treatment well Patient left: in chair;with call bell/phone within reach;with chair alarm set;with family/visitor present  OT Visit Diagnosis: Unsteadiness on feet (R26.81);Other abnormalities of gait and mobility (R26.89);Muscle weakness (generalized) (M62.81);Pain Pain - Right/Left: Right Pain - part of body: Leg;Arm  Time: 3832-9191 OT Time Calculation (min): 20 min Charges:  OT General Charges $OT Visit: 1 Procedure OT Evaluation $OT Eval Moderate Complexity: 1 Procedure G-Codes: OT G-codes **NOT FOR INPATIENT CLASS** Functional Assessment Tool Used: AM-PAC 6 Clicks Daily Activity Functional Limitation: Self care Self Care Current Status (Y6060): At least 20 percent but less than 40 percent impaired, limited or restricted Self  Care Goal Status (O4599): At least 1 percent but less than 20 percent impaired, limited or restricted   Hulda Humphrey OTR/L [place] 05/27/2017, 1:47 PM

## 2017-05-27 NOTE — Progress Notes (Signed)
   05/27/17 0900  Clinical Encounter Type  Visited With Patient and family together;Health care provider  Visit Type Follow-up;Psychological support;Spiritual support  Referral From Nurse  Consult/Referral To Chaplain  Spiritual Encounters  Spiritual Needs Literature;Brochure;Prayer;Emotional  Stress Factors  Patient Stress Factors Health changes;Other (Comment) (HCPOA/AD)  Family Stress Factors Family relationships;Health changes;Lack of knowledge;Other (Comment) (HCPOA/AD)    Chaplain responded to page from nurse on [name]. Patient is awake and sister at bedside. Completed HCPOA/AD and waiting for notary who is in a meeting. If sister is not present, paperwork will be in the nightstand. Provided emotional support for decision making, ministry of presence, education and prayer. Parminder Trapani L. Volanda Napoleon, MDiv

## 2017-05-28 DIAGNOSIS — R269 Unspecified abnormalities of gait and mobility: Secondary | ICD-10-CM | POA: Diagnosis not present

## 2017-05-28 DIAGNOSIS — R55 Syncope and collapse: Secondary | ICD-10-CM | POA: Diagnosis not present

## 2017-05-28 DIAGNOSIS — I959 Hypotension, unspecified: Secondary | ICD-10-CM | POA: Diagnosis not present

## 2017-05-28 DIAGNOSIS — Z9181 History of falling: Secondary | ICD-10-CM | POA: Diagnosis not present

## 2017-05-30 ENCOUNTER — Telehealth: Payer: Self-pay | Admitting: Internal Medicine

## 2017-05-30 DIAGNOSIS — E876 Hypokalemia: Secondary | ICD-10-CM | POA: Diagnosis not present

## 2017-05-30 DIAGNOSIS — R69 Illness, unspecified: Secondary | ICD-10-CM | POA: Diagnosis not present

## 2017-05-30 DIAGNOSIS — K5790 Diverticulosis of intestine, part unspecified, without perforation or abscess without bleeding: Secondary | ICD-10-CM | POA: Diagnosis not present

## 2017-05-30 DIAGNOSIS — K581 Irritable bowel syndrome with constipation: Secondary | ICD-10-CM | POA: Diagnosis not present

## 2017-05-30 DIAGNOSIS — E785 Hyperlipidemia, unspecified: Secondary | ICD-10-CM | POA: Diagnosis not present

## 2017-05-30 DIAGNOSIS — F419 Anxiety disorder, unspecified: Secondary | ICD-10-CM | POA: Diagnosis not present

## 2017-05-30 DIAGNOSIS — Z9181 History of falling: Secondary | ICD-10-CM | POA: Diagnosis not present

## 2017-05-30 DIAGNOSIS — G2401 Drug induced subacute dyskinesia: Secondary | ICD-10-CM | POA: Diagnosis not present

## 2017-05-30 DIAGNOSIS — R531 Weakness: Secondary | ICD-10-CM | POA: Diagnosis not present

## 2017-05-30 DIAGNOSIS — E669 Obesity, unspecified: Secondary | ICD-10-CM | POA: Diagnosis not present

## 2017-05-30 DIAGNOSIS — D649 Anemia, unspecified: Secondary | ICD-10-CM | POA: Diagnosis not present

## 2017-05-30 DIAGNOSIS — R1310 Dysphagia, unspecified: Secondary | ICD-10-CM | POA: Diagnosis not present

## 2017-05-30 DIAGNOSIS — I10 Essential (primary) hypertension: Secondary | ICD-10-CM | POA: Diagnosis not present

## 2017-05-30 NOTE — Telephone Encounter (Signed)
Called and spoke with pts sister and she is aware of appt with MW on 8/30 for HFU>  Nothing further is needed and she did not want to be seen by any other provider.

## 2017-05-31 ENCOUNTER — Ambulatory Visit (INDEPENDENT_AMBULATORY_CARE_PROVIDER_SITE_OTHER): Payer: Medicare HMO | Admitting: Neurology

## 2017-05-31 ENCOUNTER — Telehealth: Payer: Self-pay

## 2017-05-31 ENCOUNTER — Other Ambulatory Visit: Payer: Self-pay | Admitting: Internal Medicine

## 2017-05-31 ENCOUNTER — Encounter: Payer: Self-pay | Admitting: Neurology

## 2017-05-31 VITALS — BP 152/82 | HR 64 | Ht 69.0 in | Wt 235.5 lb

## 2017-05-31 DIAGNOSIS — E876 Hypokalemia: Secondary | ICD-10-CM | POA: Diagnosis not present

## 2017-05-31 DIAGNOSIS — I10 Essential (primary) hypertension: Secondary | ICD-10-CM | POA: Diagnosis not present

## 2017-05-31 DIAGNOSIS — G2401 Drug induced subacute dyskinesia: Secondary | ICD-10-CM

## 2017-05-31 DIAGNOSIS — E669 Obesity, unspecified: Secondary | ICD-10-CM | POA: Diagnosis not present

## 2017-05-31 DIAGNOSIS — R69 Illness, unspecified: Secondary | ICD-10-CM | POA: Diagnosis not present

## 2017-05-31 DIAGNOSIS — R1310 Dysphagia, unspecified: Secondary | ICD-10-CM | POA: Diagnosis not present

## 2017-05-31 DIAGNOSIS — E785 Hyperlipidemia, unspecified: Secondary | ICD-10-CM | POA: Diagnosis not present

## 2017-05-31 DIAGNOSIS — D649 Anemia, unspecified: Secondary | ICD-10-CM | POA: Diagnosis not present

## 2017-05-31 DIAGNOSIS — R531 Weakness: Secondary | ICD-10-CM | POA: Diagnosis not present

## 2017-05-31 NOTE — Telephone Encounter (Signed)
Physical Therapist called to request verbal order for PT 2 x week for 6 weeks.  Per [employer] standing order, verbal order given. Message will be sent to patient's provider as a FYI.   Last OV 05/16/17, pending OV 07/06/17

## 2017-05-31 NOTE — Progress Notes (Signed)
Reason for visit: Tardive movement disorder  Referring physician: [facility]  Judy [name] is a 71 y.o. female  History of present illness:  [name] is a 71 year old right-handed black female with a history of bipolar disorder, followed through psychiatry. The patient has been on Latuda at 120 mg for about 2 years according to the patient. The patient was recently admitted to the hospital on 05/25/2017 with a two-month history of a progressive worsening of her ability to ambulate. The patient was found to have severe lumbosacral spinal stenosis at the L4-5 level, she was seen by Dr. Ellene Route who offered surgery, but the patient wished to go through conservative management first with physical therapy. She has not yet started physical therapy since coming out of the hospital. She is now using a walker for ambulation, with walking she reports some discomfort down the back of both legs, right greater than left. The patient is unable to walk long distances because of the discomfort. The patient is sent to this office mainly because of some problems with tardive dyskinesia that she also claims started about 2 months ago. The patient has significant masking of the face, and she reports some difficulty with swallowing but she denies any choking problems. The patient was placed on Cogentin in the hospital and this seems to have improved the tardive movements. She did have some tremors on the right greater than left side, this has also improved on Cogentin. The patient does have some urinary incontinence at times, she has not had any falls since coming out of the hospital. She reports some numbness in the hands, she denies any numbness in the feet. During the recent hospitalization she underwent MRI of the brain that shows a mild to moderate level of small vessel disease, the patient also had MRI of the cervical spine and lumbar spine. No evidence of spinal cord compression was noted in the neck. She was  sent to this office for an evaluation.    Past Medical History:  Diagnosis Date  . Allergic rhinitis   . Anemia   . Anxiety   . Arthritis   . Bipolar 1 disorder ([place])   . Depression   . Diverticulosis of colon (without mention of hemorrhage) 08/25/2011   [name]  . 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 Hyperlipidemia   . Hypertension   . Obesity   . Vertigo     Past Surgical History:  Procedure Laterality Date  . COLONOSCOPY  08/25/2011   Procedure: COLONOSCOPY;  Surgeon: Landry Dyke, MD;  Location: [facility] ENDOSCOPY;  Service: Endoscopy;  Laterality: N/A;  . DILATION AND CURETTAGE OF UTERUS  1960's    Family History  Problem Relation Age of Onset  . Prostate cancer Brother   . Hypertension Brother   . Kidney disease Brother   . Colon cancer Brother   . Hypertension Mother   . Kidney disease Mother   . Stomach cancer Father   . Hyperlipidemia Sister   . Hypothyroidism Sister   . Stroke Brother   . Prostate cancer Brother     Social history:  reports that she has never smoked. She has never used smokeless tobacco. She reports that she does not drink alcohol or use drugs.  Medications:  Prior to Admission medications   Medication Sig Start Date End Date Taking? Authorizing Provider  acetaminophen (TYLENOL) 500 MG tablet Take 500 mg by mouth daily as needed for moderate pain.  Yes [provider]  amLODipine (NORVASC) 5 MG tablet Take 1 tablet (5 mg total) by mouth daily. 05/16/17  Yes Kirichenko, Tatyana, PA-C  benztropine (COGENTIN) 1 MG tablet Take 1 tablet (1 mg total) by mouth 2 (two) times daily. 05/15/17  Yes Kirichenko, Tatyana, PA-C  bisacodyl (DULCOLAX) 10 MG suppository Place 1 suppository (10 mg total) rectally daily as needed for moderate constipation. 05/27/17  Yes Sheikh, Omair Latif, DO  buPROPion (WELLBUTRIN XL) 150 MG 24 hr tablet Take 150 mg by mouth every morning. Take one tablet every morning 02/28/17  Yes [provider]    famotidine (PEPCID) 20 MG tablet One at bedtime 04/15/17  Yes Tanda Rockers, MD  FLUoxetine HCl 60 MG TABS Take 60 mg by mouth every morning. 08/03/16  Yes [provider]  LATUDA 120 MG TABS Take 120 mg by mouth at bedtime. With 350 calories 07/31/16  Yes [provider]  linaclotide (LINZESS) 145 MCG CAPS capsule Take 145 mcg by mouth daily as needed (constipation).   Yes [provider]  Menthol, Topical Analgesic, (BIOFREEZE) 4 % GEL Apply 1 application topically 2 (two) times daily as needed (pain).   Yes [provider]  naproxen sodium (ANAPROX) 220 MG tablet Take 440 mg by mouth daily as needed.   Yes [provider]  pantoprazole (PROTONIX) 40 MG tablet Take 1 tablet (40 mg total) by mouth daily. Take 30-60 min before first meal of the day 04/15/17  Yes Tanda Rockers, MD     No Known Allergies  ROS:  Out of a complete 14 system review of symptoms, the patient complains only of the following symptoms, and all other reviewed systems are negative.  Fevers, chills, weight gain, fatigue Swelling in the legs Hearing loss, ringing in the ears, dizziness, difficulty swallowing Moles Blurred vision Shortness of breath, snoring Incontinence of the bladder Easy bruising, swollen lymph nodes Feeling hot, cold Joint pain, joint swelling, muscle cramps, aching muscles Allergies, runny nose Memory loss, confusion, headache, numbness, slurred speech, difficulty swallowing, dizziness, tremor Depression, anxiety, not enough sleep, decreased energy, disinterest in activities Insomnia, snoring, restless legs  Blood pressure (!) 152/82, pulse 64, height 5\' 9"  (1.753 m), weight 235 lb 8 oz (106.8 kg).  Physical Exam  General: The patient is alert and cooperative at the time of the examination. The patient is markedly obese. Masking of the face is seen.  Eyes: Pupils are equal, round, and reactive to light. Discs are flat bilaterally.  Neck: The  neck is supple, no carotid bruits are noted.  Respiratory: The respiratory examination is clear.  Cardiovascular: The cardiovascular examination reveals a regular rate and rhythm, no obvious murmurs or rubs are noted.  Skin: Extremities are with 2+ edema below the knees bilaterally.  Neurologic Exam  Mental status: The patient is alert and oriented x 3 at the time of the examination. The patient has apparent normal recent and remote memory, with an apparently normal attention span and concentration ability.  Cranial nerves: Facial symmetry is present. There is good sensation of the face to pinprick and soft touch bilaterally. The strength of the facial muscles and the muscles to head turning and shoulder shrug are normal bilaterally. Speech is well enunciated, no aphasia or dysarthria is noted. Extraocular movements are full. Visual fields are full. The tongue is midline, and the patient has symmetric elevation of the soft palate. No obvious hearing deficits are noted.  Motor: The motor testing reveals 5 over 5 strength  of all 4 extremities. Good symmetric motor tone is noted throughout.  Sensory: Sensory testing is intact to pinprick, soft touch, vibration sensation, and position sense on all 4 extremities, with exception of some decreased pinprick sensation on the right leg with decreased position sense in the right foot. Vibration sensation was symmetric on the legs.. No evidence of extinction is noted.  Coordination: Cerebellar testing reveals good finger-nose-finger and heel-to-shin bilaterally.  Gait and station: Gait is minimally wide-based, the patient is able to walk independently but usually uses a walker for ambulation. Tandem gait was not attempted. Romberg is negative.  Reflexes: Deep tendon reflexes are symmetric, but are depressed bilaterally. Toes are downgoing bilaterally.   MRI brain 05/25/17:  IMPRESSION: 1. No acute abnormality. 2. Advanced findings of chronic  microvascular ischemia. 3. No age advanced or lobar predominant atrophy.  * MRI scan images were reviewed online. I agree with the written report.    Assessment/Plan:  1. Lumbosacral spinal stenosis, L4-5 level  2. Gait disorder  3. Bipolar disorder  4. Tardive movement disorder  The patient currently does not demonstrate tardive dyskinesia as was reported in the hospital, but Cogentin has been recently added. The patient does have tardive movements that mainly involves the diaphragm and possibly slightly the abdominal muscles. No tremor is seen at this point as previously reported. The patient has been on [place] for about 2 years, this is the likely source of the tardive movement disorder. The patient will be getting physical therapy for her walking that is in part related to her antipsychotic medication and in part related to the lumbosacral spinal stenosis. She will follow-up in 4 or 5 months, if the tardive movement problems worsen, we will add low-dose clonazepam or potentially in the future consider the use of Ingrezza.  Jill Alexanders MD 05/31/2017 9:28 AM  [facility] [address] [place], [place] [address]  Phone [phone] Fax [phone]

## 2017-05-31 NOTE — Patient Instructions (Addendum)
   We will follow the Tardive movement problems over time. If the problem is significant and is not improving, we may try a medication such as clonazepam or Ingrezza for the problems.

## 2017-06-01 ENCOUNTER — Other Ambulatory Visit: Payer: Self-pay | Admitting: Neurological Surgery

## 2017-06-01 ENCOUNTER — Telehealth: Payer: Self-pay

## 2017-06-01 DIAGNOSIS — G8929 Other chronic pain: Secondary | ICD-10-CM

## 2017-06-01 DIAGNOSIS — D649 Anemia, unspecified: Secondary | ICD-10-CM | POA: Diagnosis not present

## 2017-06-01 DIAGNOSIS — E669 Obesity, unspecified: Secondary | ICD-10-CM | POA: Diagnosis not present

## 2017-06-01 DIAGNOSIS — R69 Illness, unspecified: Secondary | ICD-10-CM | POA: Diagnosis not present

## 2017-06-01 DIAGNOSIS — E876 Hypokalemia: Secondary | ICD-10-CM | POA: Diagnosis not present

## 2017-06-01 DIAGNOSIS — E785 Hyperlipidemia, unspecified: Secondary | ICD-10-CM | POA: Diagnosis not present

## 2017-06-01 DIAGNOSIS — M545 Low back pain: Principal | ICD-10-CM

## 2017-06-01 DIAGNOSIS — R1310 Dysphagia, unspecified: Secondary | ICD-10-CM | POA: Diagnosis not present

## 2017-06-01 DIAGNOSIS — I10 Essential (primary) hypertension: Secondary | ICD-10-CM | POA: Diagnosis not present

## 2017-06-01 DIAGNOSIS — G2401 Drug induced subacute dyskinesia: Secondary | ICD-10-CM | POA: Diagnosis not present

## 2017-06-01 DIAGNOSIS — R531 Weakness: Secondary | ICD-10-CM | POA: Diagnosis not present

## 2017-06-01 NOTE — Telephone Encounter (Signed)
Noted  

## 2017-06-01 NOTE — Telephone Encounter (Signed)
Jennie with [facility] called to let Dr. Eulas Post know that patient's respirations were 38. Patrici Ranks could not tell whether patient was intentionally breathing that fast but there were no signs of distress.   Patient's vitals: 133/82; 75 pulse; 98%. Patrici Ranks stated that patient's lung sound clear.   Please advise with any instructions for [facility].

## 2017-06-01 NOTE — Telephone Encounter (Signed)
Pt is in distress if she is breathing 38 breaths/min. Please confirm. She was just d/c'd from hospital. May need to be seen to further assess

## 2017-06-01 NOTE — Telephone Encounter (Signed)
I spoke with patient and she stated that she feels fine. She did state that her breathing was fast but she didn't know why. I asked patient to schedule an appointment but she declined. She stated that she would call if she needed [place].

## 2017-06-02 ENCOUNTER — Ambulatory Visit
Admission: RE | Admit: 2017-06-02 | Discharge: 2017-06-02 | Disposition: A | Discharge status: Home / Self Care | Payer: Medicare HMO | Source: Ambulatory Visit | Attending: Neurological Surgery | Admitting: Neurological Surgery

## 2017-06-02 DIAGNOSIS — G8929 Other chronic pain: Secondary | ICD-10-CM

## 2017-06-02 DIAGNOSIS — E876 Hypokalemia: Secondary | ICD-10-CM | POA: Diagnosis not present

## 2017-06-02 DIAGNOSIS — E669 Obesity, unspecified: Secondary | ICD-10-CM | POA: Diagnosis not present

## 2017-06-02 DIAGNOSIS — D649 Anemia, unspecified: Secondary | ICD-10-CM | POA: Diagnosis not present

## 2017-06-02 DIAGNOSIS — M545 Low back pain: Principal | ICD-10-CM

## 2017-06-02 DIAGNOSIS — R531 Weakness: Secondary | ICD-10-CM | POA: Diagnosis not present

## 2017-06-02 DIAGNOSIS — R1310 Dysphagia, unspecified: Secondary | ICD-10-CM | POA: Diagnosis not present

## 2017-06-02 DIAGNOSIS — G2401 Drug induced subacute dyskinesia: Secondary | ICD-10-CM | POA: Diagnosis not present

## 2017-06-02 DIAGNOSIS — I10 Essential (primary) hypertension: Secondary | ICD-10-CM | POA: Diagnosis not present

## 2017-06-02 DIAGNOSIS — R69 Illness, unspecified: Secondary | ICD-10-CM | POA: Diagnosis not present

## 2017-06-02 DIAGNOSIS — E785 Hyperlipidemia, unspecified: Secondary | ICD-10-CM | POA: Diagnosis not present

## 2017-06-02 MED ORDER — METHYLPREDNISOLONE ACETATE 40 MG/ML INJ SUSP (RADIOLOG
120.0000 mg | Freq: Once | INTRAMUSCULAR | Status: AC
  Administered 2017-06-02: 120 mg via EPIDURAL

## 2017-06-02 MED ORDER — IOPAMIDOL (ISOVUE-M 200) INJECTION 41%
1.0000 mL | Freq: Once | INTRAMUSCULAR | Status: AC
  Administered 2017-06-02: 1 mL via EPIDURAL

## 2017-06-02 NOTE — Discharge Instructions (Signed)

## 2017-06-03 DIAGNOSIS — R531 Weakness: Secondary | ICD-10-CM | POA: Diagnosis not present

## 2017-06-03 DIAGNOSIS — E785 Hyperlipidemia, unspecified: Secondary | ICD-10-CM | POA: Diagnosis not present

## 2017-06-03 DIAGNOSIS — R1310 Dysphagia, unspecified: Secondary | ICD-10-CM | POA: Diagnosis not present

## 2017-06-03 DIAGNOSIS — E669 Obesity, unspecified: Secondary | ICD-10-CM | POA: Diagnosis not present

## 2017-06-03 DIAGNOSIS — G2401 Drug induced subacute dyskinesia: Secondary | ICD-10-CM | POA: Diagnosis not present

## 2017-06-03 DIAGNOSIS — M48061 Spinal stenosis, lumbar region without neurogenic claudication: Secondary | ICD-10-CM

## 2017-06-03 DIAGNOSIS — I10 Essential (primary) hypertension: Secondary | ICD-10-CM | POA: Diagnosis not present

## 2017-06-03 DIAGNOSIS — E876 Hypokalemia: Secondary | ICD-10-CM | POA: Diagnosis not present

## 2017-06-03 DIAGNOSIS — R69 Illness, unspecified: Secondary | ICD-10-CM | POA: Diagnosis not present

## 2017-06-03 DIAGNOSIS — D649 Anemia, unspecified: Secondary | ICD-10-CM | POA: Diagnosis not present

## 2017-06-06 DIAGNOSIS — E785 Hyperlipidemia, unspecified: Secondary | ICD-10-CM | POA: Diagnosis not present

## 2017-06-06 DIAGNOSIS — E876 Hypokalemia: Secondary | ICD-10-CM | POA: Diagnosis not present

## 2017-06-06 DIAGNOSIS — G2401 Drug induced subacute dyskinesia: Secondary | ICD-10-CM | POA: Diagnosis not present

## 2017-06-06 DIAGNOSIS — D649 Anemia, unspecified: Secondary | ICD-10-CM | POA: Diagnosis not present

## 2017-06-06 DIAGNOSIS — E669 Obesity, unspecified: Secondary | ICD-10-CM | POA: Diagnosis not present

## 2017-06-06 DIAGNOSIS — R69 Illness, unspecified: Secondary | ICD-10-CM | POA: Diagnosis not present

## 2017-06-06 DIAGNOSIS — I10 Essential (primary) hypertension: Secondary | ICD-10-CM | POA: Diagnosis not present

## 2017-06-06 DIAGNOSIS — R1310 Dysphagia, unspecified: Secondary | ICD-10-CM | POA: Diagnosis not present

## 2017-06-06 DIAGNOSIS — R531 Weakness: Secondary | ICD-10-CM | POA: Diagnosis not present

## 2017-06-07 DIAGNOSIS — G2401 Drug induced subacute dyskinesia: Secondary | ICD-10-CM | POA: Diagnosis not present

## 2017-06-07 DIAGNOSIS — R1310 Dysphagia, unspecified: Secondary | ICD-10-CM | POA: Diagnosis not present

## 2017-06-07 DIAGNOSIS — D649 Anemia, unspecified: Secondary | ICD-10-CM | POA: Diagnosis not present

## 2017-06-07 DIAGNOSIS — E876 Hypokalemia: Secondary | ICD-10-CM | POA: Diagnosis not present

## 2017-06-07 DIAGNOSIS — E669 Obesity, unspecified: Secondary | ICD-10-CM | POA: Diagnosis not present

## 2017-06-07 DIAGNOSIS — R69 Illness, unspecified: Secondary | ICD-10-CM | POA: Diagnosis not present

## 2017-06-07 DIAGNOSIS — R531 Weakness: Secondary | ICD-10-CM | POA: Diagnosis not present

## 2017-06-07 DIAGNOSIS — I10 Essential (primary) hypertension: Secondary | ICD-10-CM | POA: Diagnosis not present

## 2017-06-07 DIAGNOSIS — E785 Hyperlipidemia, unspecified: Secondary | ICD-10-CM | POA: Diagnosis not present

## 2017-06-09 ENCOUNTER — Ambulatory Visit (INDEPENDENT_AMBULATORY_CARE_PROVIDER_SITE_OTHER): Payer: Medicare HMO | Admitting: Internal Medicine

## 2017-06-09 ENCOUNTER — Encounter: Payer: Self-pay | Admitting: Internal Medicine

## 2017-06-09 VITALS — BP 122/68 | HR 64 | Ht 69.0 in | Wt 236.4 lb

## 2017-06-09 DIAGNOSIS — R0600 Dyspnea, unspecified: Secondary | ICD-10-CM

## 2017-06-09 DIAGNOSIS — R911 Solitary pulmonary nodule: Secondary | ICD-10-CM | POA: Diagnosis not present

## 2017-06-09 DIAGNOSIS — E669 Obesity, unspecified: Secondary | ICD-10-CM

## 2017-06-09 DIAGNOSIS — R0609 Other forms of dyspnea: Secondary | ICD-10-CM

## 2017-06-09 NOTE — Patient Instructions (Signed)
No evidence of a lung problem limiting your breathing - it appears to be mostly related to weakness and conditioning issues on top of chronic weight concerns   Pulmonary follow up in this setting can be as needed - good luck!

## 2017-06-09 NOTE — Progress Notes (Signed)
Subjective:     Patient ID: Judy [name], female   DOB: 1945-11-01,    MRN: [id number]    Brief patient profile:  71 yobf   never smoker  never resp problems until early  spring 2018 with indolent onset noted sob walking and eating the same day and worse since then and present all the time except when supine/sleeping referred to pulmonary clinic 04/15/2017 by Dr  Eulas Post    History of Present Illness  04/15/2017 1st [facility] office visit/ Gelisa Tieken   Chief Complaint  Patient presents with  . Follow-up    Pt was here for a PFT but was unable to complete it. Pt states that she is SOB x2 months. Denies any chest pain or cough.  was able to walk a mile a year prior to [place]  s stopping and gradually worse to point where sob  MMRC3 = can't walk 100 yards even at a slow pace at a flat grade s stopping due to sob  eg can do [employer] but not WM  Also sob at rest when trying to eat but denies dysphagia  rec  Pantoprazole (protonix) 40 mg   Take  30-60 min before first meal of the day and Pepcid (famotidine)  20 mg one @  bedtime until return to office - this is the best way to tell whether stomach acid is contributing to your problem.  GERD diet Return with all meds in hand > did not do     Admit date: 05/24/2017 Discharge date: 05/27/2017  Diet recommendation: Heart Healthy Dysphagia 3 Diet (Mechanical Soft)  Brief/Interim Summary: Patient is a 71 y.o.femalewith history of depression/anxiety/bipolar disorder on multiple psychiatric medications, admitted with 4 month history of progressive lower extremity weakness (right more than left), also with worsening dysphagia and some tardive dyskinesia-like symptoms. Neurology consulted, with recommendations to pursue MRI of cervical and lumbar spine, autoimmune workup has been sent. Neurology also recommended that this patient be transferred to [facility] for further close neurology evaluation. Patient was transferred 05/25/17 and MRI was  obtained and showed severe spinal stenosis at L4-L5 due to a combination of hypertrophied facet joints and grade 1 spondylolisthesis; In addition there was an abnormal enhancement of the thecal sac and nerve roots at the site of maximum depression. Neurosurgery Dr. Ellene Route was consulted for further evaluation and management. Patient underwent a SLP eval for worsening dysphagia and patient was placed on a Dysphagia 3 diet. Patient was offered Surgery for her spinal stenosis but refused so conservative measures were taken. PT/OT was consulted and recommended Home Health. Dr. Ellene Route ordered patient to have an Epidural Steroid Injection however could not be done as an inpatient so patient was given number to Radiologist that does them. Patient also underwent a Cervical Spine MRI which showed severe Right Neural Foraminal stenosis at C4-C5 2/2 to Disc osteophyte complex largest in the Right subarticular recess, and also showed sever Right C5-6 Neural foraminal stenosis that will need to be evaluated in the outpatient setting. Patient was deemed medically stable to D/C Home with [facility] PT/OT/RN/Aide/SW and will need to follow up with PCP, Psychiatry, Neurosurgery, Brave, and Pulmonology as an outpatient.   Discharge Diagnoses:  Principal Problem:   Tardive dyskinesia Active Problems:   Bipolar 1 disorder (HCC)   Hearing loss in left ear   Constipation   Other dysphagia   Essential hypertension, benign   Hypokalemia   Dysphagia   SOB (shortness of breath)  Fall   Bilateral leg pain  Bilateral Lower Extremity Weakness (right more than left) s/p Fall -Mildly elevated CPK, at 431TSH within normal limits.  -Not sure if she recently had acute demyelinating syndrome, and is now left with resultant deficits.  -Lumbosacral spine MRI showed Severe spinal stenosis at L4-5 due to a combination of hypertrophied facet joints and grade 1 spondylolisthesis. Abnormal enhancement of the thecal sac  and nerve roots at the site of maximum depression, best seen on image 23 of series 9 and image 7 of series 8. -MRI of the Brain w/o Contrast showed No acute abnormality. Advanced findings of chronic microvascular ischemia.. No age advanced or lobar predominant atrophy. -Neurology has ordered serological tests including RF, ANA, ACE, SSB/SSA, ANCA, TSH -Neurology also recommended to be transferred to [facility] and was transferred yesterday 05/25/17 -Neurosurgery Dr. Fidela Juneau because of Severe Spinal Stenosis at L4-L5; Recommended Surgery but patient and family refused and wanted conservative Measures -Neurosurgery ordered Epidural Steroid Injection but could not be done inpatient so will need to be done at [place]; Patient was told to call [phone] to schedule an appointment -Continued with Gentle IVF Rehydration with 1/2 NS at 75 mL/hr  -Obtained PT/OT Eval and recommended Home Health PT/OT/RN/Aide/SW -Follow up PCP and Neurosurgery as an outpatient   Tardive Dyskinesia-like symptoms and possible dysphagia: -SLP Evaluated and Treated and Recommend Dysphagia 3 Diet (Mechanical Soft) -Obtained MRI of the Cervical Spine and showed Severe Foraminal Stenosis at C4-C5 and C5-C6 -Deferred further workup and treatment to Neurology -Patient will need to see Psychiatry in the outpatient Setting  Cervical Spine Foraminal Stenosis -Seen on MRI at the Level of C4-C5 and C5-C6 -Currently asymptomatic -Follow up with Neurosurgery for further evaluation and workup as an outpatient  History of Bipolar Disorder/Depression/Anxiety -Currently appears Stable -Continue her usual medications with Bupropion 150 mg po Daily, Fluoxetine 60 mg po every morning, Lurasidone 120 mg po Daily qHS -C/w Benztropine 1 mg po BID -Follow up with [facility] as an outpatient   Hypertension -Controlled  -C/w Amlodipine 5 mg po Daily  Hard of Hearing -Especially in Left Ear  (since birth) -Follow with PCP  Constipation/IBS -C/w Linaclotide 145 mcg po Daily prn and Bisacodyl Suppository 10 mg RC Daily prn Moderate Constipation   Hypokalemia -Potassium was 3.3 this AM - Replete with IV KCl 30 mEQ - Continue to Monitor and Replete as Necessary - Repeat CMP as an outpatient     06/09/2017  f/u ov/Mirabella Hilario re: unexplained weakness > sob  Chief Complaint  Patient presents with  . Hospitalization Follow-up    Pt was recently in the hospital 8/14-8/17 due to falling and was also real SOB. Pt was transferred from [place] to [facility]. Pt is still very SOB with an occ. dry cough. Denies any CP.   not really clear she followed any of the instructions from previous ov  Clearly more weak than sob   No obvious day to day or daytime variability or assoc excess/ purulent sputum or mucus plugs or hemoptysis or cp or chest tightness, subjective wheeze or overt sinus or hb symptoms. No unusual exp hx or h/o childhood pna/ asthma or knowledge of premature birth.  Sleeping ok without nocturnal  or early am exacerbation  of respiratory  c/o's or need for noct saba. Also denies any obvious fluctuation of symptoms with weather or environmental changes or other aggravating or alleviating factors except as outlined above   Current Medications, Allergies, Complete  Past Medical History, Past Surgical History, Family History, and Social History were reviewed in [employer] record.  ROS  The following are not active complaints unless bolded sore throat, dysphagia, dental problems, itching, sneezing,  nasal congestion or excess/ purulent secretions, ear ache,   fever, chills, sweats, unintended wt loss, classically pleuritic or exertional cp,  orthopnea pnd or leg swelling, presyncope, palpitations, abdominal pain, anorexia, nausea, vomiting, diarrhea  or change in bowel or bladder habits, change in stools or urine, dysuria,hematuria,  rash, arthralgias, visual  complaints, headache, numbness, weakness or ataxia or problems with walking or coordination,  change in mood/affect or memory.                    Objective:   Physical Exam    amb obese bf nad / flat affect needs one person assist to get up and get on exam table     06/09/2017       236   04/15/17 243 lb (110.2 kg)  03/25/17 237 lb (107.5 kg)  12/09/16 241 lb 12.8 oz (109.7 kg)    Vital signs reviewed - Note on arrival 02 sats  99% on RA     HEENT: nl dentition, turbinates bilaterally, and oropharynx. Nl external ear canals without cough reflex   NECK :  without JVD/Nodes/TM/ nl carotid upstrokes bilaterally   LUNGS: no acc muscle use,  Nl contour chest which is clear to A and P bilaterally without cough on insp or exp maneuvers   CV:  RRR  no s3 or murmur or increase in P2, and no edema   ABD:  Obese/ soft and nontender with nl inspiratory excursion in the supine position. No bruits or organomegaly appreciated, bowel sounds nl  MS:  Nl gait/ ext warm without deformities, calf tenderness, cyanosis or clubbing No obvious joint restrictions   SKIN: warm and dry without lesions    NEURO:  alert, approp, nl sensorium with  no motor or cerebellar deficits apparent.  Pos resting tremor bilaterally with mild rigidity /cogwheeling      I personally reviewed images and agree with radiology impression as follows:   Chest CT w/o contrast 04/08/17  No acute intrathoracic process.  Right upper lobe 6 mm ground-glass nodule and left upper lobe 3 mm subpleural nodule.                          Assessment:

## 2017-06-10 DIAGNOSIS — R1310 Dysphagia, unspecified: Secondary | ICD-10-CM | POA: Diagnosis not present

## 2017-06-10 DIAGNOSIS — D649 Anemia, unspecified: Secondary | ICD-10-CM | POA: Diagnosis not present

## 2017-06-10 DIAGNOSIS — E876 Hypokalemia: Secondary | ICD-10-CM | POA: Diagnosis not present

## 2017-06-10 DIAGNOSIS — I10 Essential (primary) hypertension: Secondary | ICD-10-CM | POA: Diagnosis not present

## 2017-06-10 DIAGNOSIS — R531 Weakness: Secondary | ICD-10-CM | POA: Diagnosis not present

## 2017-06-10 DIAGNOSIS — R69 Illness, unspecified: Secondary | ICD-10-CM | POA: Diagnosis not present

## 2017-06-10 DIAGNOSIS — E785 Hyperlipidemia, unspecified: Secondary | ICD-10-CM | POA: Diagnosis not present

## 2017-06-10 DIAGNOSIS — E669 Obesity, unspecified: Secondary | ICD-10-CM | POA: Diagnosis not present

## 2017-06-10 DIAGNOSIS — G2401 Drug induced subacute dyskinesia: Secondary | ICD-10-CM | POA: Diagnosis not present

## 2017-06-13 ENCOUNTER — Encounter: Payer: Self-pay | Admitting: Internal Medicine

## 2017-06-13 DIAGNOSIS — R911 Solitary pulmonary nodule: Secondary | ICD-10-CM | POA: Insufficient documentation

## 2017-06-13 NOTE — Assessment & Plan Note (Signed)
-   Spirometry 04/15/2017  FEV1 1.78 (76%)  Ratio 78 s curvature p no rx   04/15/2017   Walked RA  2 laps @ 185 ft each stopped due to  Sob/ no desats/ nl pace 06/09/2017   Walked RA  2 laps @ 185 ft each stopped due to  Weak legs > sob/ slow pace, no desat  I had an extended final summary discussion with the patient /fm reviewing all relevant studies including extenve in pt records in EPIC completed to date and  lasting 15 to 20 minutes of a 25 minute visit on the following issues:   No evidence at all of a lung problem here - she clearly has NM weakness with mutiple concerns as per Inpt records plus obesity/ deconditioning and no excess/ purulent sputum or mucus plugs or nocturnal symptoms to suggest asthma but is at risk of aspiration based on ST eval (which is again not a pulmonary issue)  Would rec continue empirical rx for gerd due to persistent daytime cough and pulmonary f/u prn but must remember to return with all meds in hand using a trust but verify approach to confirm accurate Medication  Reconciliation The principal here is that until we are certain that the  patients are doing what we've asked, it makes no sense to ask them to do more.

## 2017-06-13 NOTE — Assessment & Plan Note (Signed)
Body mass index is 34.91 kg/m.  -  Trending down  Lab Results  Component Value Date   TSH 1.450 05/24/2017     Contributing to gerd risk/ doe/reviewed the need and the process to achieve and maintain neg calorie balance > defer f/u primary care including intermittently monitoring thyroid status

## 2017-06-13 NOTE — Assessment & Plan Note (Signed)
See ct 04/08/17 x 6 mm RUL/ 3 mm LUL   In never smoker with plethora of co-comorbidities f/u CT in one year is optional/ pulmonary f/u is prn

## 2017-06-15 DIAGNOSIS — R1310 Dysphagia, unspecified: Secondary | ICD-10-CM | POA: Diagnosis not present

## 2017-06-15 DIAGNOSIS — E876 Hypokalemia: Secondary | ICD-10-CM | POA: Diagnosis not present

## 2017-06-15 DIAGNOSIS — I10 Essential (primary) hypertension: Secondary | ICD-10-CM | POA: Diagnosis not present

## 2017-06-15 DIAGNOSIS — R69 Illness, unspecified: Secondary | ICD-10-CM | POA: Diagnosis not present

## 2017-06-15 DIAGNOSIS — G2401 Drug induced subacute dyskinesia: Secondary | ICD-10-CM | POA: Diagnosis not present

## 2017-06-15 DIAGNOSIS — E785 Hyperlipidemia, unspecified: Secondary | ICD-10-CM | POA: Diagnosis not present

## 2017-06-15 DIAGNOSIS — R531 Weakness: Secondary | ICD-10-CM | POA: Diagnosis not present

## 2017-06-15 DIAGNOSIS — D649 Anemia, unspecified: Secondary | ICD-10-CM | POA: Diagnosis not present

## 2017-06-15 DIAGNOSIS — E669 Obesity, unspecified: Secondary | ICD-10-CM | POA: Diagnosis not present

## 2017-06-17 DIAGNOSIS — E876 Hypokalemia: Secondary | ICD-10-CM | POA: Diagnosis not present

## 2017-06-17 DIAGNOSIS — R531 Weakness: Secondary | ICD-10-CM | POA: Diagnosis not present

## 2017-06-17 DIAGNOSIS — E669 Obesity, unspecified: Secondary | ICD-10-CM | POA: Diagnosis not present

## 2017-06-17 DIAGNOSIS — R1310 Dysphagia, unspecified: Secondary | ICD-10-CM | POA: Diagnosis not present

## 2017-06-17 DIAGNOSIS — D649 Anemia, unspecified: Secondary | ICD-10-CM | POA: Diagnosis not present

## 2017-06-17 DIAGNOSIS — E785 Hyperlipidemia, unspecified: Secondary | ICD-10-CM | POA: Diagnosis not present

## 2017-06-17 DIAGNOSIS — R69 Illness, unspecified: Secondary | ICD-10-CM | POA: Diagnosis not present

## 2017-06-17 DIAGNOSIS — I10 Essential (primary) hypertension: Secondary | ICD-10-CM | POA: Diagnosis not present

## 2017-06-17 DIAGNOSIS — G2401 Drug induced subacute dyskinesia: Secondary | ICD-10-CM | POA: Diagnosis not present

## 2017-06-18 DIAGNOSIS — I10 Essential (primary) hypertension: Secondary | ICD-10-CM | POA: Diagnosis not present

## 2017-06-18 DIAGNOSIS — E785 Hyperlipidemia, unspecified: Secondary | ICD-10-CM | POA: Diagnosis not present

## 2017-06-18 DIAGNOSIS — E876 Hypokalemia: Secondary | ICD-10-CM | POA: Diagnosis not present

## 2017-06-18 DIAGNOSIS — R531 Weakness: Secondary | ICD-10-CM | POA: Diagnosis not present

## 2017-06-18 DIAGNOSIS — R69 Illness, unspecified: Secondary | ICD-10-CM | POA: Diagnosis not present

## 2017-06-18 DIAGNOSIS — R1310 Dysphagia, unspecified: Secondary | ICD-10-CM | POA: Diagnosis not present

## 2017-06-18 DIAGNOSIS — G2401 Drug induced subacute dyskinesia: Secondary | ICD-10-CM | POA: Diagnosis not present

## 2017-06-18 DIAGNOSIS — D649 Anemia, unspecified: Secondary | ICD-10-CM | POA: Diagnosis not present

## 2017-06-18 DIAGNOSIS — E669 Obesity, unspecified: Secondary | ICD-10-CM | POA: Diagnosis not present

## 2017-06-20 DIAGNOSIS — E785 Hyperlipidemia, unspecified: Secondary | ICD-10-CM | POA: Diagnosis not present

## 2017-06-20 DIAGNOSIS — R69 Illness, unspecified: Secondary | ICD-10-CM | POA: Diagnosis not present

## 2017-06-20 DIAGNOSIS — R531 Weakness: Secondary | ICD-10-CM | POA: Diagnosis not present

## 2017-06-20 DIAGNOSIS — R1310 Dysphagia, unspecified: Secondary | ICD-10-CM | POA: Diagnosis not present

## 2017-06-20 DIAGNOSIS — G2401 Drug induced subacute dyskinesia: Secondary | ICD-10-CM | POA: Diagnosis not present

## 2017-06-20 DIAGNOSIS — D649 Anemia, unspecified: Secondary | ICD-10-CM | POA: Diagnosis not present

## 2017-06-20 DIAGNOSIS — E669 Obesity, unspecified: Secondary | ICD-10-CM | POA: Diagnosis not present

## 2017-06-20 DIAGNOSIS — I10 Essential (primary) hypertension: Secondary | ICD-10-CM | POA: Diagnosis not present

## 2017-06-20 DIAGNOSIS — E876 Hypokalemia: Secondary | ICD-10-CM | POA: Diagnosis not present

## 2017-06-21 DIAGNOSIS — R69 Illness, unspecified: Secondary | ICD-10-CM | POA: Diagnosis not present

## 2017-06-21 DIAGNOSIS — E785 Hyperlipidemia, unspecified: Secondary | ICD-10-CM | POA: Diagnosis not present

## 2017-06-21 DIAGNOSIS — G2401 Drug induced subacute dyskinesia: Secondary | ICD-10-CM | POA: Diagnosis not present

## 2017-06-21 DIAGNOSIS — I10 Essential (primary) hypertension: Secondary | ICD-10-CM | POA: Diagnosis not present

## 2017-06-21 DIAGNOSIS — D649 Anemia, unspecified: Secondary | ICD-10-CM | POA: Diagnosis not present

## 2017-06-21 DIAGNOSIS — E876 Hypokalemia: Secondary | ICD-10-CM | POA: Diagnosis not present

## 2017-06-21 DIAGNOSIS — R531 Weakness: Secondary | ICD-10-CM | POA: Diagnosis not present

## 2017-06-21 DIAGNOSIS — E669 Obesity, unspecified: Secondary | ICD-10-CM | POA: Diagnosis not present

## 2017-06-21 DIAGNOSIS — R1310 Dysphagia, unspecified: Secondary | ICD-10-CM | POA: Diagnosis not present

## 2017-06-22 DIAGNOSIS — G2401 Drug induced subacute dyskinesia: Secondary | ICD-10-CM | POA: Diagnosis not present

## 2017-06-22 DIAGNOSIS — E669 Obesity, unspecified: Secondary | ICD-10-CM | POA: Diagnosis not present

## 2017-06-22 DIAGNOSIS — R531 Weakness: Secondary | ICD-10-CM | POA: Diagnosis not present

## 2017-06-22 DIAGNOSIS — E876 Hypokalemia: Secondary | ICD-10-CM | POA: Diagnosis not present

## 2017-06-22 DIAGNOSIS — R1310 Dysphagia, unspecified: Secondary | ICD-10-CM | POA: Diagnosis not present

## 2017-06-22 DIAGNOSIS — E785 Hyperlipidemia, unspecified: Secondary | ICD-10-CM | POA: Diagnosis not present

## 2017-06-22 DIAGNOSIS — R69 Illness, unspecified: Secondary | ICD-10-CM | POA: Diagnosis not present

## 2017-06-22 DIAGNOSIS — I10 Essential (primary) hypertension: Secondary | ICD-10-CM | POA: Diagnosis not present

## 2017-06-22 DIAGNOSIS — D649 Anemia, unspecified: Secondary | ICD-10-CM | POA: Diagnosis not present

## 2017-06-23 ENCOUNTER — Other Ambulatory Visit: Payer: Self-pay | Admitting: Internal Medicine

## 2017-06-23 DIAGNOSIS — R1310 Dysphagia, unspecified: Secondary | ICD-10-CM | POA: Diagnosis not present

## 2017-06-23 DIAGNOSIS — I10 Essential (primary) hypertension: Secondary | ICD-10-CM | POA: Diagnosis not present

## 2017-06-23 DIAGNOSIS — R69 Illness, unspecified: Secondary | ICD-10-CM | POA: Diagnosis not present

## 2017-06-23 DIAGNOSIS — R531 Weakness: Secondary | ICD-10-CM | POA: Diagnosis not present

## 2017-06-23 DIAGNOSIS — G2401 Drug induced subacute dyskinesia: Secondary | ICD-10-CM | POA: Diagnosis not present

## 2017-06-23 DIAGNOSIS — E876 Hypokalemia: Secondary | ICD-10-CM | POA: Diagnosis not present

## 2017-06-23 DIAGNOSIS — D649 Anemia, unspecified: Secondary | ICD-10-CM | POA: Diagnosis not present

## 2017-06-23 DIAGNOSIS — E669 Obesity, unspecified: Secondary | ICD-10-CM | POA: Diagnosis not present

## 2017-06-23 DIAGNOSIS — E785 Hyperlipidemia, unspecified: Secondary | ICD-10-CM | POA: Diagnosis not present

## 2017-06-24 ENCOUNTER — Other Ambulatory Visit: Payer: Self-pay | Admitting: *Deleted

## 2017-06-24 DIAGNOSIS — I10 Essential (primary) hypertension: Secondary | ICD-10-CM | POA: Diagnosis not present

## 2017-06-24 DIAGNOSIS — R69 Illness, unspecified: Secondary | ICD-10-CM | POA: Diagnosis not present

## 2017-06-24 DIAGNOSIS — E669 Obesity, unspecified: Secondary | ICD-10-CM | POA: Diagnosis not present

## 2017-06-24 DIAGNOSIS — G2401 Drug induced subacute dyskinesia: Secondary | ICD-10-CM | POA: Diagnosis not present

## 2017-06-24 DIAGNOSIS — E876 Hypokalemia: Secondary | ICD-10-CM | POA: Diagnosis not present

## 2017-06-24 DIAGNOSIS — R531 Weakness: Secondary | ICD-10-CM | POA: Diagnosis not present

## 2017-06-24 DIAGNOSIS — D649 Anemia, unspecified: Secondary | ICD-10-CM | POA: Diagnosis not present

## 2017-06-24 DIAGNOSIS — R1310 Dysphagia, unspecified: Secondary | ICD-10-CM | POA: Diagnosis not present

## 2017-06-24 DIAGNOSIS — E785 Hyperlipidemia, unspecified: Secondary | ICD-10-CM | POA: Diagnosis not present

## 2017-06-24 MED ORDER — AMLODIPINE BESYLATE 5 MG PO TABS: 5.0000 mg | ORAL_TABLET | Freq: Every day | ORAL | 0 refills | Status: DC

## 2017-06-24 NOTE — Telephone Encounter (Signed)
Patient requested. Faxed to pharmacy.  

## 2017-06-27 ENCOUNTER — Encounter (HOSPITAL_COMMUNITY): Payer: Self-pay | Admitting: Emergency Medicine

## 2017-06-27 DIAGNOSIS — M79604 Pain in right leg: Secondary | ICD-10-CM | POA: Diagnosis not present

## 2017-06-27 DIAGNOSIS — R600 Localized edema: Secondary | ICD-10-CM | POA: Diagnosis not present

## 2017-06-27 DIAGNOSIS — R2241 Localized swelling, mass and lump, right lower limb: Secondary | ICD-10-CM | POA: Diagnosis not present

## 2017-06-27 DIAGNOSIS — R0602 Shortness of breath: Secondary | ICD-10-CM | POA: Diagnosis not present

## 2017-06-27 DIAGNOSIS — R2242 Localized swelling, mass and lump, left lower limb: Secondary | ICD-10-CM | POA: Diagnosis present

## 2017-06-27 NOTE — ED Triage Notes (Signed)
Pt poor historian, states her legs have been swollen since yesterday but also states she called her doctor 1 week ago about her swollen legs... Denies CP or SOB

## 2017-06-28 ENCOUNTER — Emergency Department (HOSPITAL_COMMUNITY): Payer: Medicare HMO

## 2017-06-28 ENCOUNTER — Telehealth: Payer: Self-pay | Admitting: *Deleted

## 2017-06-28 ENCOUNTER — Emergency Department (HOSPITAL_COMMUNITY)
Admission: EM | Admit: 2017-06-28 | Discharge: 2017-06-28 | Disposition: A | Discharge status: Home / Self Care | Payer: Medicare HMO | Attending: Emergency Medicine | Admitting: Emergency Medicine

## 2017-06-28 ENCOUNTER — Emergency Department (HOSPITAL_BASED_OUTPATIENT_CLINIC_OR_DEPARTMENT_OTHER)
Admit: 2017-06-28 | Discharge: 2017-06-28 | Disposition: A | Discharge status: Home / Self Care | Payer: Medicare HMO | Attending: Emergency Medicine | Admitting: Emergency Medicine

## 2017-06-28 DIAGNOSIS — G2401 Drug induced subacute dyskinesia: Secondary | ICD-10-CM | POA: Diagnosis not present

## 2017-06-28 DIAGNOSIS — E785 Hyperlipidemia, unspecified: Secondary | ICD-10-CM | POA: Diagnosis not present

## 2017-06-28 DIAGNOSIS — R2243 Localized swelling, mass and lump, lower limb, bilateral: Secondary | ICD-10-CM

## 2017-06-28 DIAGNOSIS — R1310 Dysphagia, unspecified: Secondary | ICD-10-CM | POA: Diagnosis not present

## 2017-06-28 DIAGNOSIS — R69 Illness, unspecified: Secondary | ICD-10-CM | POA: Diagnosis not present

## 2017-06-28 DIAGNOSIS — R609 Edema, unspecified: Secondary | ICD-10-CM

## 2017-06-28 DIAGNOSIS — R531 Weakness: Secondary | ICD-10-CM | POA: Diagnosis not present

## 2017-06-28 DIAGNOSIS — R0602 Shortness of breath: Secondary | ICD-10-CM | POA: Diagnosis not present

## 2017-06-28 DIAGNOSIS — R2242 Localized swelling, mass and lump, left lower limb: Secondary | ICD-10-CM | POA: Diagnosis not present

## 2017-06-28 DIAGNOSIS — R2241 Localized swelling, mass and lump, right lower limb: Secondary | ICD-10-CM | POA: Diagnosis not present

## 2017-06-28 DIAGNOSIS — M79604 Pain in right leg: Secondary | ICD-10-CM | POA: Diagnosis not present

## 2017-06-28 DIAGNOSIS — M7989 Other specified soft tissue disorders: Secondary | ICD-10-CM | POA: Diagnosis not present

## 2017-06-28 DIAGNOSIS — I10 Essential (primary) hypertension: Secondary | ICD-10-CM | POA: Diagnosis not present

## 2017-06-28 DIAGNOSIS — E669 Obesity, unspecified: Secondary | ICD-10-CM | POA: Diagnosis not present

## 2017-06-28 DIAGNOSIS — D649 Anemia, unspecified: Secondary | ICD-10-CM | POA: Diagnosis not present

## 2017-06-28 DIAGNOSIS — E876 Hypokalemia: Secondary | ICD-10-CM | POA: Diagnosis not present

## 2017-06-28 LAB — BASIC METABOLIC PANEL
Anion gap: 10 (ref 5–15)
BUN: 12 mg/dL (ref 6–20)
CO2: 23 mmol/L (ref 22–32)
Calcium: 9.5 mg/dL (ref 8.9–10.3)
Chloride: 105 mmol/L (ref 101–111)
Creatinine, Ser: 1.14 mg/dL — ABNORMAL HIGH (ref 0.44–1.00)
GFR calc non Af Amer: 48 mL/min — ABNORMAL LOW (ref >60)
GFR, EST AFRICAN AMERICAN: 56 mL/min — AB (ref >60)
Glucose, Bld: 88 mg/dL (ref 65–99)
Potassium: 3.2 mmol/L — ABNORMAL LOW (ref 3.5–5.1)
SODIUM: 138 mmol/L (ref 135–145)

## 2017-06-28 LAB — CBC WITH DIFFERENTIAL/PLATELET
Basophils Absolute: 0 10*3/uL (ref 0.0–0.1)
Basophils Relative: 0 %
Eosinophils Absolute: 0.1 10*3/uL (ref 0.0–0.7)
Eosinophils Relative: 2 %
HEMATOCRIT: 36.1 % (ref 36.0–46.0)
HEMOGLOBIN: 12.2 g/dL (ref 12.0–15.0)
LYMPHS ABS: 2.3 10*3/uL (ref 0.7–4.0)
LYMPHS PCT: 37 %
MCH: 29.5 pg (ref 26.0–34.0)
MCHC: 33.8 g/dL (ref 30.0–36.0)
MCV: 87.4 fL (ref 78.0–100.0)
Monocytes Absolute: 0.6 10*3/uL (ref 0.1–1.0)
Monocytes Relative: 9 %
NEUTROS ABS: 3.3 10*3/uL (ref 1.7–7.7)
NEUTROS PCT: 52 %
Platelets: 292 10*3/uL (ref 150–400)
RBC: 4.13 MIL/uL (ref 3.87–5.11)
RDW: 15.8 % — ABNORMAL HIGH (ref 11.5–15.5)
WBC: 6.2 10*3/uL (ref 4.0–10.5)

## 2017-06-28 NOTE — Progress Notes (Addendum)
VASCULAR LAB PRELIMINARY  PRELIMINARY  PRELIMINARY  PRELIMINARY  Bilateral lower extremity venous duplex completed.    Preliminary report:  There is no DVT or SVT noted in the bilateral lower extremities.   Called results to Dr. Marilu Favre, [facility], RVT 06/28/2017, 8:34 AM

## 2017-06-28 NOTE — Telephone Encounter (Signed)
Judy [name] with [facility] care called and just wanted to let you know that patient went to [facility] due to Increased edema.

## 2017-06-28 NOTE — ED Notes (Signed)
Assisted the pt from the toilet to the wheelchair, and then into her bed.  Pt tolerated well with assistance.

## 2017-06-28 NOTE — ED Notes (Signed)
EDP at the bedside.  ?

## 2017-06-28 NOTE — ED Notes (Signed)
Pt ambulated in room with two person assist, pt states that she is unable to move her R leg at all but was observed doing so. Pt states that he leg wont stop shaking.

## 2017-06-28 NOTE — ED Provider Notes (Signed)
[facility] DEPT Provider Note   CSN: [id number] Arrival date & time: 06/27/17  1911     History   Chief Complaint Chief Complaint  Patient presents with  . Leg Swelling    HPI Judy [name] is a 71 y.o. female.  The history is provided by the patient.  Illness  This is a new problem. The problem occurs constantly. The problem has been gradually worsening. Associated symptoms include shortness of breath. Pertinent negatives include no chest pain and no abdominal pain. The symptoms are aggravated by walking. The symptoms are relieved by rest.  Patient presents for multiple complaints She initially reported LE swelling over the past 24 hrs She also reports pain in her legs, right >left No trauma/falls reported She reports difficulty walking on her right leg She also mentions feeling weak in her right leg with "pins and needles" in leg.  She also mentions "right hand is weak" and her speech "has been off" She does report some shortness of breath   Past Medical History:  Diagnosis Date  . Allergic rhinitis   . Anemia   . Anxiety   . Arthritis   . Bipolar 1 disorder ([place])   . Depression   . Diverticulosis of colon (without mention of hemorrhage) 08/25/2011   [name]  . 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 Hyperlipidemia   . Hypertension   . Obesity   . Vertigo     Patient Active Problem List   Diagnosis Date Noted  . Solitary pulmonary nodule on lung CT 06/13/2017  . Spinal stenosis at L4-L5 level   . Dysphagia 05/25/2017  . Tardive dyskinesia 05/25/2017  . Dyspnea on exertion 05/25/2017  . Fall 05/25/2017  . Bilateral leg pain   . Chronic pain of right knee 03/25/2017  . High risk medication use 03/25/2017  . Family hx of colon cancer 03/25/2017  . Occupational exposure in workplace 03/25/2017  . Depressed bipolar I disorder in partial remission ([place]) 03/25/2017  . Routine general medical examination at a health care facility 06/04/2014  .  Hyperlipidemia with target LDL less than 130 03/20/2014  . Essential hypertension, benign 03/20/2014  . Unspecified constipation 03/20/2014  . Light headedness 03/20/2014  . Acute on chronic kidney disease, stage 3 03/20/2014  . Hypokalemia 03/20/2014  . Encounter for therapeutic drug monitoring 03/20/2014  . Other dysphagia 12/26/2013  . GERD (gastroesophageal reflux disease) 12/21/2013  . Constipation 12/21/2013  . Syncope 12/10/2013  . Leukocytosis 12/10/2013  . URI (upper respiratory infection) 12/10/2013  . Bipolar 1 disorder ([place])   . Obesity (BMI 30-39.9)   . Hearing loss in left ear   . Vertigo   . HYPERCHOLESTEROLEMIA 10/08/2010  . DEPRESSION 10/08/2010  . HYPERTENSION 10/08/2010  . ALLERGIC RHINITIS 10/08/2010    Past Surgical History:  Procedure Laterality Date  . COLONOSCOPY  08/25/2011   Procedure: COLONOSCOPY;  Surgeon: Landry Dyke, MD;  Location: [facility];  Service: Endoscopy;  Laterality: N/A;  . DILATION AND CURETTAGE OF UTERUS  1960's    OB History    No data available       Home Medications    Prior to Admission medications   Medication Sig Start Date End Date Taking? Authorizing Provider  acetaminophen (TYLENOL) 500 MG tablet Take 500 mg by mouth daily as needed for moderate pain.    [provider]  amLODipine (NORVASC) 5 MG tablet Take 1 tablet (5 mg total) by mouth daily. 06/24/17  [name], DO  benztropine (COGENTIN) 1 MG tablet Take 1 tablet (1 mg total) by mouth 2 (two) times daily. 05/15/17   Kirichenko, Tatyana, PA-C  bisacodyl (DULCOLAX) 10 MG suppository Place 1 suppository (10 mg total) rectally daily as needed for moderate constipation. 05/27/17   Raiford Noble Latif, DO  buPROPion (WELLBUTRIN XL) 150 MG 24 hr tablet Take 150 mg by mouth every morning. Take one tablet every morning 02/28/17   [provider]  famotidine (PEPCID) 20 MG tablet One at bedtime 04/15/17   Tanda Rockers, MD  FLUoxetine HCl 60 MG TABS  Take 60 mg by mouth every morning. 08/03/16   [provider]  LATUDA 120 MG TABS Take 120 mg by mouth at bedtime. With 350 calories 07/31/16   [provider]  linaclotide (LINZESS) 145 MCG CAPS capsule Take 145 mcg by mouth daily as needed (constipation).    [provider]  Menthol, Topical Analgesic, (BIOFREEZE) 4 % GEL Apply 1 application topically 2 (two) times daily as needed (pain).    [provider]  naproxen sodium (ANAPROX) 220 MG tablet Take 440 mg by mouth daily as needed.    [provider]  pantoprazole (PROTONIX) 40 MG tablet Take 1 tablet (40 mg total) by mouth daily. Take 30-60 min before first meal of the day 04/15/17   Tanda Rockers, MD    Family History Family History  Problem Relation Age of Onset  . Prostate cancer Brother   . Hypertension Brother   . Kidney disease Brother   . Colon cancer Brother   . Hypertension Mother   . Kidney disease Mother   . Stomach cancer Father   . Hyperlipidemia Sister   . Hypothyroidism Sister   . Stroke Brother   . Prostate cancer Brother     Social History Social History  Substance Use Topics  . Smoking status: Never Smoker  . Smokeless tobacco: Never Used  . Alcohol use No     Allergies   Patient has no known allergies.   Review of Systems Review of Systems  Constitutional: Negative for fever.  Respiratory: Positive for shortness of breath.   Cardiovascular: Positive for leg swelling. Negative for chest pain.  Gastrointestinal: Negative for abdominal pain.  Musculoskeletal: Negative for back pain.  Neurological: Positive for weakness and numbness.  All other systems reviewed and are negative.    Physical Exam Updated Vital Signs BP (!) 151/75   Pulse 61   Temp 98.9 F (37.2 C) (Oral)   Resp 18   Ht 1.753 m (5\' 9" )   Wt 106.6 kg (235 lb)   SpO2 99%   BMI 34.70 kg/m   Physical Exam  CONSTITUTIONAL: Well developed/well nourished HEAD:  Normocephalic/atraumatic EYES: EOMI/PERRL ENMT: Mucous membranes moist NECK: supple no meningeal signs SPINE/BACK:entire spine nontender CV: S1/S2 noted, no murmurs/rubs/gallops noted LUNGS: Lungs are clear to auscultation bilaterally, no apparent distress ABDOMEN: soft, nontender, no rebound or guarding, bowel sounds noted throughout abdomen GU:no cva tenderness NEURO: Pt is awake/alert/appropriate, moves all extremitiesx4.  No facial droop.  No arm drift.  Equal hand grips.  Right hip flexion is limited due to pain in thigh. Otherwise, equal distal motor 5/5 strength noted with the following: ankle dorsi/plantar flexion, great toe extension intact bilaterally, no clonus bilaterally, plantar reflex appropriate (toes downgoing), no sensory deficit in any dermatome.  Equal patellar/achilles reflex noted in bilateral lower extremities EXTREMITIES: pulses normal/equal, full ROM, LE edema, right>leg no calf tenderness/edema No LE deformities  noted SKIN: warm, color normal PSYCH: Flat affect  ED Treatments / Results  Labs (all labs ordered are listed, but only abnormal results are displayed) Labs Reviewed  BASIC METABOLIC PANEL - Abnormal; Notable for the following:       Result Value   Potassium 3.2 (*)    Creatinine, Ser 1.14 (*)    GFR calc non Af Amer 48 (*)    GFR calc Af Amer 56 (*)    All other components within normal limits  CBC WITH DIFFERENTIAL/PLATELET - Abnormal; Notable for the following:    RDW 15.8 (*)    All other components within normal limits    EKG  EKG Interpretation  Date/Time:  Tuesday June 28 2017 04:21:51 EDT Ventricular Rate:  61 PR Interval:    QRS Duration: 98 QT Interval:  496 QTC Calculation: 496 R Axis:   4 Text Interpretation:  Sinus rhythm Atrial premature complex Low voltage, precordial leads Abnormal R-wave progression, early transition Borderline prolonged QT interval No significant change since last tracing Interpretation limited  secondary to artifact Confirmed by Ripley Fraise [phone]) on 06/28/2017 4:27:21 AM       Radiology Dg Chest 2 View  Result Date: 06/28/2017 CLINICAL DATA:  Dizziness and shortness of breath tonight. EXAM: CHEST  2 VIEW COMPARISON:  05/24/2017 FINDINGS: Cardiac enlargement. No vascular congestion or edema. No focal consolidation. No blunting of costophrenic angles. No pneumothorax. Tortuous aorta. Degenerative changes in the spine. midthoracic vertebral compression deformities similar to previous study. IMPRESSION: Cardiac enlargement.  No evidence of active pulmonary disease. Electronically Signed   By: Lucienne Capers M.D.   On: 06/28/2017 04:19    Procedures Procedures (including critical care time)  Medications Ordered in ED Medications - No data to display   Initial Impression / Assessment and Plan / ED Course  I have reviewed the triage vital signs and the nursing notes.  Pertinent labs & imaging results that were available during my care of the patient were reviewed by me and considered in my medical decision making (see chart for details).     4:32 AM Pt very poor historian Initial chief complaint was leg swelling but she keeps mentioning leg weakness and pain and difficulty walking  Review of chart reveals pt had extensive workup in August 2018 including MRI brain/spine She has known lumbar spinal stenosis, which is likely contributing to right LE pain/weakness She had no stroke noted on MR brain last month She had been offered surgery for spinal stenosis but refused 7:03 AM Pt tells me that her difficulty ambulating has been present for a month After multiple discussions it has been difficult to determine what exactly triggered this visit She now reports it is because her legs are edematous  Will order DVT studies of both legs Signed out to dr Johnney Killian at shift change  Final Clinical Impressions(s) / ED Diagnoses   Final diagnoses:  Peripheral edema    New  Prescriptions New Prescriptions   No medications on file     Ripley Fraise, MD 06/28/17 [phone]

## 2017-06-29 DIAGNOSIS — E785 Hyperlipidemia, unspecified: Secondary | ICD-10-CM | POA: Diagnosis not present

## 2017-06-29 DIAGNOSIS — E669 Obesity, unspecified: Secondary | ICD-10-CM | POA: Diagnosis not present

## 2017-06-29 DIAGNOSIS — R531 Weakness: Secondary | ICD-10-CM | POA: Diagnosis not present

## 2017-06-29 DIAGNOSIS — E876 Hypokalemia: Secondary | ICD-10-CM | POA: Diagnosis not present

## 2017-06-29 DIAGNOSIS — I10 Essential (primary) hypertension: Secondary | ICD-10-CM | POA: Diagnosis not present

## 2017-06-29 DIAGNOSIS — D649 Anemia, unspecified: Secondary | ICD-10-CM | POA: Diagnosis not present

## 2017-06-29 DIAGNOSIS — R69 Illness, unspecified: Secondary | ICD-10-CM | POA: Diagnosis not present

## 2017-06-29 DIAGNOSIS — G2401 Drug induced subacute dyskinesia: Secondary | ICD-10-CM | POA: Diagnosis not present

## 2017-06-29 DIAGNOSIS — R1310 Dysphagia, unspecified: Secondary | ICD-10-CM | POA: Diagnosis not present

## 2017-06-30 ENCOUNTER — Telehealth: Payer: Self-pay | Admitting: *Deleted

## 2017-06-30 DIAGNOSIS — E669 Obesity, unspecified: Secondary | ICD-10-CM | POA: Diagnosis not present

## 2017-06-30 DIAGNOSIS — I10 Essential (primary) hypertension: Secondary | ICD-10-CM | POA: Diagnosis not present

## 2017-06-30 DIAGNOSIS — G2401 Drug induced subacute dyskinesia: Secondary | ICD-10-CM | POA: Diagnosis not present

## 2017-06-30 DIAGNOSIS — R69 Illness, unspecified: Secondary | ICD-10-CM | POA: Diagnosis not present

## 2017-06-30 DIAGNOSIS — E876 Hypokalemia: Secondary | ICD-10-CM | POA: Diagnosis not present

## 2017-06-30 DIAGNOSIS — E785 Hyperlipidemia, unspecified: Secondary | ICD-10-CM | POA: Diagnosis not present

## 2017-06-30 DIAGNOSIS — R531 Weakness: Secondary | ICD-10-CM | POA: Diagnosis not present

## 2017-06-30 DIAGNOSIS — R1310 Dysphagia, unspecified: Secondary | ICD-10-CM | POA: Diagnosis not present

## 2017-06-30 DIAGNOSIS — D649 Anemia, unspecified: Secondary | ICD-10-CM | POA: Diagnosis not present

## 2017-06-30 NOTE — Telephone Encounter (Signed)
Patient sister, Lulu Riding called and stated that patient has an appointment next Wednesday 26th. Stated that patient had to go back to the ER due to her leg pain. Sister feels like no one is doing anything for her legs and patient cannot walk well. Stated that PT/OT is coming in but she feels like it is not working. Sister feels like legs are getting worse. She stated that everyone keeps telling her that she needs go go back to her PCP to get something done. Sister stated that she will be at patient's appointment on Wednesday.

## 2017-06-30 NOTE — Telephone Encounter (Signed)
Noted - pt has not been seen since June 2018. Will discuss leg pain at [facility]

## 2017-07-01 DIAGNOSIS — E669 Obesity, unspecified: Secondary | ICD-10-CM | POA: Diagnosis not present

## 2017-07-01 DIAGNOSIS — D649 Anemia, unspecified: Secondary | ICD-10-CM | POA: Diagnosis not present

## 2017-07-01 DIAGNOSIS — I10 Essential (primary) hypertension: Secondary | ICD-10-CM | POA: Diagnosis not present

## 2017-07-01 DIAGNOSIS — R531 Weakness: Secondary | ICD-10-CM | POA: Diagnosis not present

## 2017-07-01 DIAGNOSIS — R1310 Dysphagia, unspecified: Secondary | ICD-10-CM | POA: Diagnosis not present

## 2017-07-01 DIAGNOSIS — E876 Hypokalemia: Secondary | ICD-10-CM | POA: Diagnosis not present

## 2017-07-01 DIAGNOSIS — E785 Hyperlipidemia, unspecified: Secondary | ICD-10-CM | POA: Diagnosis not present

## 2017-07-01 DIAGNOSIS — R69 Illness, unspecified: Secondary | ICD-10-CM | POA: Diagnosis not present

## 2017-07-01 DIAGNOSIS — G2401 Drug induced subacute dyskinesia: Secondary | ICD-10-CM | POA: Diagnosis not present

## 2017-07-02 DIAGNOSIS — R531 Weakness: Secondary | ICD-10-CM | POA: Diagnosis not present

## 2017-07-02 DIAGNOSIS — G2401 Drug induced subacute dyskinesia: Secondary | ICD-10-CM | POA: Diagnosis not present

## 2017-07-02 DIAGNOSIS — R1310 Dysphagia, unspecified: Secondary | ICD-10-CM | POA: Diagnosis not present

## 2017-07-02 DIAGNOSIS — I10 Essential (primary) hypertension: Secondary | ICD-10-CM | POA: Diagnosis not present

## 2017-07-02 DIAGNOSIS — E876 Hypokalemia: Secondary | ICD-10-CM | POA: Diagnosis not present

## 2017-07-02 DIAGNOSIS — R69 Illness, unspecified: Secondary | ICD-10-CM | POA: Diagnosis not present

## 2017-07-02 DIAGNOSIS — E669 Obesity, unspecified: Secondary | ICD-10-CM | POA: Diagnosis not present

## 2017-07-02 DIAGNOSIS — D649 Anemia, unspecified: Secondary | ICD-10-CM | POA: Diagnosis not present

## 2017-07-02 DIAGNOSIS — E785 Hyperlipidemia, unspecified: Secondary | ICD-10-CM | POA: Diagnosis not present

## 2017-07-03 ENCOUNTER — Other Ambulatory Visit: Payer: Self-pay | Admitting: Internal Medicine

## 2017-07-04 DIAGNOSIS — D649 Anemia, unspecified: Secondary | ICD-10-CM | POA: Diagnosis not present

## 2017-07-04 DIAGNOSIS — R1310 Dysphagia, unspecified: Secondary | ICD-10-CM | POA: Diagnosis not present

## 2017-07-04 DIAGNOSIS — E785 Hyperlipidemia, unspecified: Secondary | ICD-10-CM | POA: Diagnosis not present

## 2017-07-04 DIAGNOSIS — E669 Obesity, unspecified: Secondary | ICD-10-CM | POA: Diagnosis not present

## 2017-07-04 DIAGNOSIS — G2401 Drug induced subacute dyskinesia: Secondary | ICD-10-CM | POA: Diagnosis not present

## 2017-07-04 DIAGNOSIS — I10 Essential (primary) hypertension: Secondary | ICD-10-CM | POA: Diagnosis not present

## 2017-07-04 DIAGNOSIS — R69 Illness, unspecified: Secondary | ICD-10-CM | POA: Diagnosis not present

## 2017-07-04 DIAGNOSIS — E876 Hypokalemia: Secondary | ICD-10-CM | POA: Diagnosis not present

## 2017-07-04 DIAGNOSIS — R531 Weakness: Secondary | ICD-10-CM | POA: Diagnosis not present

## 2017-07-05 ENCOUNTER — Telehealth: Payer: Self-pay | Admitting: Internal Medicine

## 2017-07-05 NOTE — Telephone Encounter (Signed)
Left msg to confirm/remind appt. VDM (DD)

## 2017-07-06 ENCOUNTER — Ambulatory Visit (INDEPENDENT_AMBULATORY_CARE_PROVIDER_SITE_OTHER): Payer: Medicare HMO

## 2017-07-06 ENCOUNTER — Encounter: Payer: Self-pay | Admitting: Internal Medicine

## 2017-07-06 ENCOUNTER — Ambulatory Visit (INDEPENDENT_AMBULATORY_CARE_PROVIDER_SITE_OTHER): Payer: Medicare HMO | Admitting: Internal Medicine

## 2017-07-06 VITALS — BP 118/74 | HR 75 | Temp 98.9°F | Ht 69.0 in | Wt 228.2 lb

## 2017-07-06 DIAGNOSIS — R69 Illness, unspecified: Secondary | ICD-10-CM | POA: Diagnosis not present

## 2017-07-06 DIAGNOSIS — I10 Essential (primary) hypertension: Secondary | ICD-10-CM

## 2017-07-06 DIAGNOSIS — R918 Other nonspecific abnormal finding of lung field: Secondary | ICD-10-CM

## 2017-07-06 DIAGNOSIS — Z0000 Encounter for general adult medical examination without abnormal findings: Secondary | ICD-10-CM | POA: Diagnosis not present

## 2017-07-06 DIAGNOSIS — F319 Bipolar disorder, unspecified: Secondary | ICD-10-CM | POA: Diagnosis not present

## 2017-07-06 DIAGNOSIS — M48061 Spinal stenosis, lumbar region without neurogenic claudication: Secondary | ICD-10-CM

## 2017-07-06 DIAGNOSIS — G2401 Drug induced subacute dyskinesia: Secondary | ICD-10-CM

## 2017-07-06 DIAGNOSIS — R29898 Other symptoms and signs involving the musculoskeletal system: Secondary | ICD-10-CM | POA: Diagnosis not present

## 2017-07-06 DIAGNOSIS — R0602 Shortness of breath: Secondary | ICD-10-CM

## 2017-07-06 NOTE — Patient Instructions (Signed)
Continue current medications as ordered  Home Health as ordered  Follow up with specialists as scheduled  Recommend contact Psych regarding adjusting Latuda and/or cogentin dose  Follow up in 1 month for SOB, RLE weakness, and back pain

## 2017-07-06 NOTE — Progress Notes (Signed)
Patient ID: Judy [name], female   DOB: 09-03-1946, 71 y.o.   MRN: [id number]    Location:  [facility] Place of Service: OFFICE  Chief Complaint  Patient presents with  . Medical Management of Chronic Issues    3 month follow-up. AWV completed today, refused all vaccine updates. MMSE 27/30 (did not pass clock drawing)   . Medication Management    Discuss all medications , medication issues, back surgery,     HPI:  71 yo female seen today for f/u. She was seen in the ED 06/28/17 for leg pain. Family c/a cause of pain. Hip xray done 06/28/17 showed early degenerative changes. she then had MRI cervical, lumbar and brain. She had right L4 nerve root block and epidural by Dr Danielle Dess on 06/02/17 for severe spinal stenosis. Injection ineffective and she was told she needs surgery. She is a poor historian due to psych d/o, memory loss. Hx obtained from chart and family. Her brother, sister-in-law and sister are present. Family is c/a her apparent SOB and limb jerking/tremors. Pt was taken to psych and her meds were adjusted. She has seen pulmonary. CTA chest neg PE but showed nodules.  In June 2018, pt reported she has arthritic pain in knee that improves with Advil. She has chest tightness at rest and with ambulation. No wheezing. She was employed at [employer], Oncologist. She was not req'd to wear mask and she did not. She has never smoked cigs but has had second hand smoke exposure at home as a child. No hx child hood asthma. No cough or hemoptysis.  Depression/anxiety/bipolar - mood stable on wellbutrin XL, fluoxetine, latuda. seroquel stopped 05/15/17 in ED 2/2 c/a TD. Cogentin started. Followed by Marva Panda, PAC. A1c 5.6%  HTN - BP stable on amlodipine. She had a nuclear stress test in Jan 2018 that was abnormal. She f/u with cardio who thought abnormality was due to shifting breast attenuation. EF nml  Arthritis/chronic right knee pain - stable. She has occasional joint pain controlled with advil  OTC. She had burning and stinging in right anterior thigh not long ago that resolved with advil  Hyperlipidemia - diet controlled. LDL 117; HDL 61  GERD - pulmonary started on pepcid and changed pantoprazole to daily. She takes Linzess prn constipation     Past Medical History:  Diagnosis Date  . Allergic rhinitis   . Anemia   . Anxiety   . Arthritis   . Bipolar 1 disorder (HCC)   . Depression   . Diverticulosis of colon (without mention of hemorrhage) 08/25/2011   Dr. Dulce Sellar  . 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 Hyperlipidemia   . Hypertension   . Obesity   . Vertigo     Past Surgical History:  Procedure Laterality Date  . COLONOSCOPY  08/25/2011   Procedure: COLONOSCOPY;  Surgeon: Freddy Jaksch, MD;  Location: [facility];  Service: Endoscopy;  Laterality: N/A;  . DILATION AND CURETTAGE OF UTERUS  1960's    Patient Care Team: Kirt Boys, DO as PCP - General (Internal Medicine) Barnett Abu, MD as Consulting Physician (Neurosurgery) Cottle, Steva Ready., MD as Attending Physician (Psychiatry)  Social History   Social History  . Marital status: Single    Spouse name: N/A  . Number of children: 1  . Years of education: 10   Occupational History  . Retired    Social History Main Topics  . Smoking status: Never Smoker  .  Smokeless tobacco: Never Used  . Alcohol use No  . Drug use: No  . Sexual activity: No   Other Topics Concern  . Not on file   Social History Narrative   Lives alone in house, does not need assist device.  Works part time at [employer]. Has a son.     Cousin and sister in close proximity   Right handed   2 cups coffee per day     reports that she has never smoked. She has never used smokeless tobacco. She reports that she does not drink alcohol or use drugs.  Family History  Problem Relation Age of Onset  . Prostate cancer Brother   . Hypertension Brother   . Kidney disease Brother   . Colon cancer  Brother   . Hypertension Mother   . Kidney disease Mother   . Stomach cancer Father   . Hyperlipidemia Sister   . Hypothyroidism Sister   . Stroke Brother   . Prostate cancer Brother    Family Status  Relation Status  . Brother Alive  . Mother Deceased at age 56       kidney failure  . Father Deceased at age 82       stomach cancer  . Sister Alive  . Brother Deceased  . MGM Deceased  . MGF Deceased  . PGM Deceased  . PGF Deceased     No Known Allergies  Medications: Patient's Medications  New Prescriptions   No medications on file  Previous Medications   ACETAMINOPHEN (TYLENOL) 500 MG TABLET    Take 500 mg by mouth daily as needed for moderate pain.   AMLODIPINE (NORVASC) 5 MG TABLET    Take 1 tablet (5 mg total) by mouth daily.   BENZTROPINE (COGENTIN) 1 MG TABLET    Take 1 tablet (1 mg total) by mouth 2 (two) times daily.   BUPROPION (WELLBUTRIN XL) 150 MG 24 HR TABLET    Take 150 mg by mouth every morning. Take one tablet every morning   FAMOTIDINE (PEPCID) 20 MG TABLET    One at bedtime   FLUOXETINE HCL 60 MG TABS    Take 60 mg by mouth every morning.   LATUDA 120 MG TABS    Take 120 mg by mouth at bedtime. With 350 calories   LINACLOTIDE (LINZESS) 145 MCG CAPS CAPSULE    Take 145 mcg by mouth daily as needed (constipation).   MECLIZINE (ANTIVERT) 25 MG TABLET    Take 25 mg by mouth 3 (three) times daily as needed for dizziness.   MENTHOL, TOPICAL ANALGESIC, (BIOFREEZE) 4 % GEL    Apply 1 application topically 2 (two) times daily as needed (pain).   NAPROXEN SODIUM (ANAPROX) 220 MG TABLET    Take 440 mg by mouth daily as needed (pain).    PANTOPRAZOLE (PROTONIX) 40 MG TABLET    TAKE 1 TABLET BY MOUTH DAILY. TAKE 30-60 MIN BEFORE FIRST MEAL OF THE DAY  Modified Medications   No medications on file  Discontinued Medications   BISACODYL (DULCOLAX) 10 MG SUPPOSITORY    Place 1 suppository (10 mg total) rectally daily as needed for moderate constipation.    Review of  Systems  Unable to perform ROS: Psychiatric disorder    Vitals:   07/06/17 1338  BP: 118/74  Pulse: 75  Temp: 98.9 F (37.2 C)  TempSrc: Oral  SpO2: 96%  Weight: 228 lb 3.2 oz (103.5 kg)  Height: '5\' 9"'$  (1.753  m)   Body mass index is 33.7 kg/m.  Physical Exam  Constitutional: She appears well-developed and well-nourished.  HENT:  Mouth/Throat: Oropharynx is clear and moist. No oropharyngeal exudate.  MMM; no oral thrush  Eyes: Pupils are equal, round, and reactive to light. No scleral icterus.  Neck: Neck supple. Carotid bruit is not present. No tracheal deviation present. No thyromegaly present.  Cardiovascular: Normal rate, regular rhythm and intact distal pulses.  Exam reveals no gallop and no friction rub.   Murmur (1/6 SEM) heard. No LE edema b/l. no calf TTP.   Pulmonary/Chest: Effort normal. No stridor. No respiratory distress. She has no wheezes. She has no rales.  Reduced BS b/l at base  Abdominal: Soft. Normal appearance and bowel sounds are normal. She exhibits no distension and no mass. There is no hepatomegaly. There is tenderness. There is no rigidity, no rebound and no guarding. No hernia.  Musculoskeletal: She exhibits edema and tenderness.  (+) RLE SLR  Lymphadenopathy:    She has no cervical adenopathy.  Neurological: She is alert.  Lip smacking; trismus  Skin: Skin is warm and dry. No rash noted.  Psychiatric: She has a normal mood and affect. Her behavior is normal.     Labs reviewed: Admission on 06/28/2017, Discharged on 06/28/2017  Component Date Value Ref Range Status  . Sodium 06/28/2017 138  135 - 145 mmol/L Final  . Potassium 06/28/2017 3.2* 3.5 - 5.1 mmol/L Final  . Chloride 06/28/2017 105  101 - 111 mmol/L Final  . CO2 06/28/2017 23  22 - 32 mmol/L Final  . Glucose, Bld 06/28/2017 88  65 - 99 mg/dL Final  . BUN 06/28/2017 12  6 - 20 mg/dL Final  . Creatinine, Ser 06/28/2017 1.14* 0.44 - 1.00 mg/dL Final  . Calcium 06/28/2017 9.5  8.9 -  10.3 mg/dL Final  . GFR calc non Af Amer 06/28/2017 48* >60 mL/min Final  . GFR calc Af Amer 06/28/2017 56* >60 mL/min Final   Comment: (NOTE) The eGFR has been calculated using the CKD EPI equation. This calculation has not been validated in all clinical situations. eGFR's persistently <60 mL/min signify possible Chronic Kidney Disease.   . Anion gap 06/28/2017 10  5 - 15 Final  . WBC 06/28/2017 6.2  4.0 - 10.5 K/uL Final  . RBC 06/28/2017 4.13  3.87 - 5.11 MIL/uL Final  . Hemoglobin 06/28/2017 12.2  12.0 - 15.0 g/dL Final  . HCT 06/28/2017 36.1  36.0 - 46.0 % Final  . MCV 06/28/2017 87.4  78.0 - 100.0 fL Final  . MCH 06/28/2017 29.5  26.0 - 34.0 pg Final  . MCHC 06/28/2017 33.8  30.0 - 36.0 g/dL Final  . RDW 06/28/2017 15.8* 11.5 - 15.5 % Final  . Platelets 06/28/2017 292  150 - 400 K/uL Final  . Neutrophils Relative % 06/28/2017 52  % Final  . Neutro Abs 06/28/2017 3.3  1.7 - 7.7 K/uL Final  . Lymphocytes Relative 06/28/2017 37  % Final  . Lymphs Abs 06/28/2017 2.3  0.7 - 4.0 K/uL Final  . Monocytes Relative 06/28/2017 9  % Final  . Monocytes Absolute 06/28/2017 0.6  0.1 - 1.0 K/uL Final  . Eosinophils Relative 06/28/2017 2  % Final  . Eosinophils Absolute 06/28/2017 0.1  0.0 - 0.7 K/uL Final  . Basophils Relative 06/28/2017 0  % Final  . Basophils Absolute 06/28/2017 0.0  0.0 - 0.1 K/uL Final  Admission on 05/24/2017, Discharged on 05/27/2017  Component Date Value  Ref Range Status  . Total CK 05/24/2017 431* 38 - 234 U/L Final  . Sed Rate 05/24/2017 20  0 - 22 mm/hr Final  . WBC 05/24/2017 9.3  4.0 - 10.5 K/uL Final  . RBC 05/24/2017 4.43  3.87 - 5.11 MIL/uL Final  . Hemoglobin 05/24/2017 13.2  12.0 - 15.0 g/dL Final  . HCT 54/31/2393 38.0  36.0 - 46.0 % Final  . MCV 05/24/2017 85.8  78.0 - 100.0 fL Final  . MCH 05/24/2017 29.8  26.0 - 34.0 pg Final  . MCHC 05/24/2017 34.7  30.0 - 36.0 g/dL Final  . RDW 08/25/1630 15.2  11.5 - 15.5 % Final  . Platelets 05/24/2017 325  150  - 400 K/uL Final  . Neutrophils Relative % 05/24/2017 60  % Final  . Neutro Abs 05/24/2017 5.5  1.7 - 7.7 K/uL Final  . Lymphocytes Relative 05/24/2017 28  % Final  . Lymphs Abs 05/24/2017 2.6  0.7 - 4.0 K/uL Final  . Monocytes Relative 05/24/2017 11  % Final  . Monocytes Absolute 05/24/2017 1.0  0.1 - 1.0 K/uL Final  . Eosinophils Relative 05/24/2017 1  % Final  . Eosinophils Absolute 05/24/2017 0.1  0.0 - 0.7 K/uL Final  . Basophils Relative 05/24/2017 0  % Final  . Basophils Absolute 05/24/2017 0.0  0.0 - 0.1 K/uL Final  . Sodium 05/24/2017 140  135 - 145 mmol/L Final  . Potassium 05/24/2017 3.2* 3.5 - 5.1 mmol/L Final  . Chloride 05/24/2017 103  101 - 111 mmol/L Final  . CO2 05/24/2017 27  22 - 32 mmol/L Final  . Glucose, Bld 05/24/2017 98  65 - 99 mg/dL Final  . BUN 18/91/9356 18  6 - 20 mg/dL Final  . Creatinine, Ser 05/24/2017 0.92  0.44 - 1.00 mg/dL Final  . Calcium 06/11/2797 9.6  8.9 - 10.3 mg/dL Final  . Total Protein 05/24/2017 7.5  6.5 - 8.1 g/dL Final  . Albumin 00/77/6186 4.0  3.5 - 5.0 g/dL Final  . AST 36/35/0829 30  15 - 41 U/L Final  . ALT 05/24/2017 28  14 - 54 U/L Final  . Alkaline Phosphatase 05/24/2017 74  38 - 126 U/L Final  . Total Bilirubin 05/24/2017 0.4  0.3 - 1.2 mg/dL Final  . GFR calc non Af Amer 05/24/2017 >60  >60 mL/min Final  . GFR calc Af Amer 05/24/2017 >60  >60 mL/min Final   Comment: (NOTE) The eGFR has been calculated using the CKD EPI equation. This calculation has not been validated in all clinical situations. eGFR's persistently <60 mL/min signify possible Chronic Kidney Disease.   . Anion gap 05/24/2017 10  5 - 15 Final  . B Natriuretic Peptide 05/24/2017 22.2  0.0 - 100.0 pg/mL Final  . Troponin i, poc 05/24/2017 0.00  0.00 - 0.08 ng/mL Final  . Comment 3 05/24/2017          Final   Comment: Due to the release kinetics of cTnI, a negative result within the first hours of the onset of symptoms does not rule out myocardial infarction  with certainty. If myocardial infarction is still suspected, repeat the test at appropriate intervals.   [name] TSH 05/24/2017 1.450  0.350 - 4.500 uIU/mL Final   Performed by a 3rd Generation assay with a functional sensitivity of <=0.01 uIU/mL.  [name] C-ANCA 05/25/2017 <1:20  Neg:<1:20 titer Final  . P-ANCA 05/25/2017 <1:20  Neg:<1:20 titer Final   Comment: (NOTE) The presence of positive fluorescence exhibiting P-ANCA  or C-ANCA patterns alone is not specific for the diagnosis of Wegener's Granulomatosis (WG) or microscopic polyangiitis. Decisions about treatment should not be based solely on ANCA IFA results.  The International ANCA Group Consensus recommends follow up testing of positive sera with both PR-3 and MPO-ANCA enzyme immunoassays. As many as 5% serum samples are positive only by EIA. Ref. AM J Clin Pathol 1999;111:507-513.   [name] Atypical P-ANCA titer 05/25/2017 <1:20  Neg:<1:20 titer Final   Comment: (NOTE) The atypical pANCA pattern has been observed in a significant percentage of patients with ulcerative colitis, primary sclerosing cholangitis and autoimmune hepatitis. Performed At: [facility] [place], [place] [id number] Lindon Romp MD [id number]   . Myeloperoxidase Abs 05/25/2017 <9.0  0.0 - 9.0 U/mL Final  . ANCA Proteinase 3 05/25/2017 <3.5  0.0 - 3.5 U/mL Corrected   Comment: (NOTE) Performed At: [facility] [address], [place] [id number] Lindon Romp MD [id number]   . Angiotensin-Converting Enzyme 05/25/2017 17  14 - 82 U/L Final   Comment: (NOTE) Performed At: [facility] [place], [place] [id number] Lindon Romp MD [id number]   . Anit Nuclear Antibody(ANA) 05/25/2017 Negative  Negative Final   Comment: (NOTE) Performed At: [facility] [place], [place] [id number] Lindon Romp MD ZH:[id number]   . Rhuematoid fact SerPl-aCnc 05/25/2017 <10.0   0.0 - 13.9 IU/mL Final   Comment: (NOTE) Performed At: [facility] [place], [place] [id number] Lindon Romp MD [id number]   . SSA (Ro) (ENA) Antibody, IgG 05/25/2017 <0.2  0.0 - 0.9 AI Final   Comment: (NOTE) Performed At: [facility] [place], [place] [id number] Lindon Romp MD JS:[id number]   . SSB (La) (ENA) Antibody, IgG 05/25/2017 <0.2  0.0 - 0.9 AI Final   Comment: (NOTE) Performed At: [facility] [place], [place] [id number] Lindon Romp MD IN:[id number]   . WBC 05/26/2017 7.2  4.0 - 10.5 K/uL Final  . RBC 05/26/2017 4.39  3.87 - 5.11 MIL/uL Final  . Hemoglobin 05/26/2017 12.7  12.0 - 15.0 g/dL Final  . HCT 05/26/2017 38.0  36.0 - 46.0 % Final  . MCV 05/26/2017 86.6  78.0 - 100.0 fL Final  . MCH 05/26/2017 28.9  26.0 - 34.0 pg Final  . MCHC 05/26/2017 33.4  30.0 - 36.0 g/dL Final  . RDW 05/26/2017 15.4  11.5 - 15.5 % Final  . Platelets 05/26/2017 326  150 - 400 K/uL Final  . Neutrophils Relative % 05/26/2017 53  % Final  . Neutro Abs 05/26/2017 3.8  1.7 - 7.7 K/uL Final  . Lymphocytes Relative 05/26/2017 35  % Final  . Lymphs Abs 05/26/2017 2.6  0.7 - 4.0 K/uL Final  . Monocytes Relative 05/26/2017 9  % Final  . Monocytes Absolute 05/26/2017 0.7  0.1 - 1.0 K/uL Final  . Eosinophils Relative 05/26/2017 3  % Final  . Eosinophils Absolute 05/26/2017 0.2  0.0 - 0.7 K/uL Final  . Basophils Relative 05/26/2017 0  % Final  . Basophils Absolute 05/26/2017 0.0  0.0 - 0.1 K/uL Final  . Sodium 05/26/2017 140  135 - 145 mmol/L Final  . Potassium 05/26/2017 3.5  3.5 - 5.1 mmol/L Final  . Chloride 05/26/2017 105  101 - 111 mmol/L Final  . CO2 05/26/2017 22  22 - 32 mmol/L Final  . Glucose, Bld 05/26/2017 120* 65 - 99 mg/dL Final  .  BUN 05/26/2017 8  6 - 20 mg/dL Final  . Creatinine, Ser 05/26/2017 0.84  0.44 - 1.00 mg/dL Final  . Calcium 05/26/2017 9.1  8.9 - 10.3 mg/dL Final  . Total Protein  05/26/2017 6.7  6.5 - 8.1 g/dL Final  . Albumin 05/26/2017 3.5  3.5 - 5.0 g/dL Final  . AST 05/26/2017 32  15 - 41 U/L Final  . ALT 05/26/2017 30  14 - 54 U/L Final  . Alkaline Phosphatase 05/26/2017 73  38 - 126 U/L Final  . Total Bilirubin 05/26/2017 0.6  0.3 - 1.2 mg/dL Final  . GFR calc non Af Amer 05/26/2017 >60  >60 mL/min Final  . GFR calc Af Amer 05/26/2017 >60  >60 mL/min Final   Comment: (NOTE) The eGFR has been calculated using the CKD EPI equation. This calculation has not been validated in all clinical situations. eGFR's persistently <60 mL/min signify possible Chronic Kidney Disease.   . Anion gap 05/26/2017 13  5 - 15 Final  . Magnesium 05/26/2017 2.0  1.7 - 2.4 mg/dL Final  . Phosphorus 05/26/2017 3.1  2.5 - 4.6 mg/dL Final  . WBC 05/27/2017 7.8  4.0 - 10.5 K/uL Final  . RBC 05/27/2017 4.31  3.87 - 5.11 MIL/uL Final  . Hemoglobin 05/27/2017 12.4  12.0 - 15.0 g/dL Final  . HCT 05/27/2017 37.3  36.0 - 46.0 % Final  . MCV 05/27/2017 86.5  78.0 - 100.0 fL Final  . MCH 05/27/2017 28.8  26.0 - 34.0 pg Final  . MCHC 05/27/2017 33.2  30.0 - 36.0 g/dL Final  . RDW 05/27/2017 15.4  11.5 - 15.5 % Final  . Platelets 05/27/2017 328  150 - 400 K/uL Final  . Neutrophils Relative % 05/27/2017 53  % Final  . Neutro Abs 05/27/2017 4.1  1.7 - 7.7 K/uL Final  . Lymphocytes Relative 05/27/2017 34  % Final  . Lymphs Abs 05/27/2017 2.6  0.7 - 4.0 K/uL Final  . Monocytes Relative 05/27/2017 10  % Final  . Monocytes Absolute 05/27/2017 0.8  0.1 - 1.0 K/uL Final  . Eosinophils Relative 05/27/2017 3  % Final  . Eosinophils Absolute 05/27/2017 0.2  0.0 - 0.7 K/uL Final  . Basophils Relative 05/27/2017 0  % Final  . Basophils Absolute 05/27/2017 0.0  0.0 - 0.1 K/uL Final  . Sodium 05/27/2017 139  135 - 145 mmol/L Final  . Potassium 05/27/2017 3.3* 3.5 - 5.1 mmol/L Final  . Chloride 05/27/2017 103  101 - 111 mmol/L Final  . CO2 05/27/2017 28  22 - 32 mmol/L Final  . Glucose, Bld 05/27/2017  103* 65 - 99 mg/dL Final  . BUN 05/27/2017 9  6 - 20 mg/dL Final  . Creatinine, Ser 05/27/2017 0.83  0.44 - 1.00 mg/dL Final  . Calcium 05/27/2017 9.2  8.9 - 10.3 mg/dL Final  . Total Protein 05/27/2017 6.7  6.5 - 8.1 g/dL Final  . Albumin 05/27/2017 3.4* 3.5 - 5.0 g/dL Final  . AST 05/27/2017 27  15 - 41 U/L Final  . ALT 05/27/2017 29  14 - 54 U/L Final  . Alkaline Phosphatase 05/27/2017 71  38 - 126 U/L Final  . Total Bilirubin 05/27/2017 0.5  0.3 - 1.2 mg/dL Final  . GFR calc non Af Amer 05/27/2017 >60  >60 mL/min Final  . GFR calc Af Amer 05/27/2017 >60  >60 mL/min Final   Comment: (NOTE) The eGFR has been calculated using the CKD EPI equation. This calculation has not been  validated in all clinical situations. eGFR's persistently <60 mL/min signify possible Chronic Kidney Disease.   . Anion gap 05/27/2017 8  5 - 15 Final  . Magnesium 05/27/2017 2.1  1.7 - 2.4 mg/dL Final  . Phosphorus 05/27/2017 3.3  2.5 - 4.6 mg/dL Final  Admission on 05/15/2017, Discharged on 05/16/2017  Component Date Value Ref Range Status  . WBC 05/15/2017 8.6  4.0 - 10.5 K/uL Final  . RBC 05/15/2017 4.25  3.87 - 5.11 MIL/uL Final  . Hemoglobin 05/15/2017 12.6  12.0 - 15.0 g/dL Final  . HCT 05/15/2017 36.8  36.0 - 46.0 % Final  . MCV 05/15/2017 86.6  78.0 - 100.0 fL Final  . MCH 05/15/2017 29.6  26.0 - 34.0 pg Final  . MCHC 05/15/2017 34.2  30.0 - 36.0 g/dL Final  . RDW 05/15/2017 15.3  11.5 - 15.5 % Final  . Platelets 05/15/2017 260  150 - 400 K/uL Final  . Neutrophils Relative % 05/15/2017 57  % Final  . Neutro Abs 05/15/2017 4.9  1.7 - 7.7 K/uL Final  . Lymphocytes Relative 05/15/2017 31  % Final  . Lymphs Abs 05/15/2017 2.7  0.7 - 4.0 K/uL Final  . Monocytes Relative 05/15/2017 9  % Final  . Monocytes Absolute 05/15/2017 0.8  0.1 - 1.0 K/uL Final  . Eosinophils Relative 05/15/2017 3  % Final  . Eosinophils Absolute 05/15/2017 0.2  0.0 - 0.7 K/uL Final  . Basophils Relative 05/15/2017 0  % Final    . Basophils Absolute 05/15/2017 0.0  0.0 - 0.1 K/uL Final  . Sodium 05/15/2017 143  135 - 145 mmol/L Final  . Potassium 05/15/2017 3.5  3.5 - 5.1 mmol/L Final  . Chloride 05/15/2017 108  101 - 111 mmol/L Final  . CO2 05/15/2017 26  22 - 32 mmol/L Final  . Glucose, Bld 05/15/2017 105* 65 - 99 mg/dL Final  . BUN 05/15/2017 14  6 - 20 mg/dL Final  . Creatinine, Ser 05/15/2017 0.96  0.44 - 1.00 mg/dL Final  . Calcium 05/15/2017 9.4  8.9 - 10.3 mg/dL Final  . Total Protein 05/15/2017 7.0  6.5 - 8.1 g/dL Final  . Albumin 05/15/2017 3.9  3.5 - 5.0 g/dL Final  . AST 05/15/2017 44* 15 - 41 U/L Final  . ALT 05/15/2017 31  14 - 54 U/L Final  . Alkaline Phosphatase 05/15/2017 73  38 - 126 U/L Final  . Total Bilirubin 05/15/2017 0.4  0.3 - 1.2 mg/dL Final  . GFR calc non Af Amer 05/15/2017 59* >60 mL/min Final  . GFR calc Af Amer 05/15/2017 >60  >60 mL/min Final   Comment: (NOTE) The eGFR has been calculated using the CKD EPI equation. This calculation has not been validated in all clinical situations. eGFR's persistently <60 mL/min signify possible Chronic Kidney Disease.   . Anion gap 05/15/2017 9  5 - 15 Final  . Troponin i, poc 05/15/2017 0.00  0.00 - 0.08 ng/mL Final  . Comment 3 05/15/2017          Final   Comment: Due to the release kinetics of cTnI, a negative result within the first hours of the onset of symptoms does not rule out myocardial infarction with certainty. If myocardial infarction is still suspected, repeat the test at appropriate intervals.   . B Natriuretic Peptide 05/15/2017 38.7  0.0 - 100.0 pg/mL Final  . TSH 05/15/2017 0.659  0.350 - 4.500 uIU/mL Final   Performed by a 3rd Generation assay with a  functional sensitivity of <=0.01 uIU/mL.  [name] FIO2 05/15/2017 21.00   Final  . pH, Arterial 05/15/2017 7.506* 7.350 - 7.450 Final  . pCO2 arterial 05/15/2017 31.9* 32.0 - 48.0 mmHg Final  . pO2, Arterial 05/15/2017 98.0  83.0 - 108.0 mmHg Final  . Bicarbonate 05/15/2017  25.0  20.0 - 28.0 mmol/L Final  . Acid-Base Excess 05/15/2017 2.8* 0.0 - 2.0 mmol/L Final  . O2 Saturation 05/15/2017 97.7  % Final  . Patient temperature 05/15/2017 98.8   Final  . Collection site 05/15/2017 LEFT RADIAL   Final  . Drawn by 05/15/2017 11249   Final  . Sample type 05/15/2017 ARTERIAL DRAW   Final  . Chauncey Reading test (pass/fail) 05/15/2017 PASS  PASS Final  Clinical Support on 04/15/2017  Component Date Value Ref Range Status  . FVC-Pre 04/15/2017 1.87  L Final  . FVC-%Pred-Pre 04/15/2017 63  % Final  . FEV1-Pre 04/15/2017 1.52  L Final  . FEV1-%Pred-Pre 04/15/2017 65  % Final  . FEV6-Pre 04/15/2017 1.87  L Final  . FEV6-%Pred-Pre 04/15/2017 65  % Final  . Pre FEV1/FVC ratio 04/15/2017 81  % Final  . FEV1FVC-%Pred-Pre 04/15/2017 105  % Final  . Pre FEV6/FVC Ratio 04/15/2017 100  % Final  . FEV6FVC-%Pred-Pre 04/15/2017 103  % Final  . FEF 25-75 Pre 04/15/2017 1.46  L/sec Final  . FEF2575-%Pred-Pre 04/15/2017 70  % Final  Lab on 04/15/2017  Component Date Value Ref Range Status  . Pro B Natriuretic peptide (BNP) 04/15/2017 23.0  0.0 - 100.0 pg/mL Final  . Allergen, D pternoyssinus,d7 04/15/2017 <0.10  kU/L Final  . D. farinae 04/15/2017 <0.10  kU/L Final  . Cat Dander 04/15/2017 <0.10  kU/L Final  . Dog Dander 04/15/2017 <0.10  kU/L Final  . [place] Grass 04/15/2017 0.92* kU/L Final  . Jalene Mullet 04/15/2017 2.17* kU/L Final  . [place] Amy 04/15/2017 0.78* kU/L Final  . Cockroach 04/15/2017 <0.10  kU/L Final  . Allergen, P. notatum, m1 04/15/2017 <0.10  kU/L Final  . Allergen, C. Herbarum, M2 04/15/2017 <0.10  kU/L Final  . Aspergillus fumigatus, m3 04/15/2017 <0.10  kU/L Final  . Allergen, A. alternata, m6 04/15/2017 <0.10  kU/L Final  . Box Elder IgE 04/15/2017 <0.10  kU/L Final  . Allergen, Comm Silver Wendee Copp, t9 04/15/2017 9.21* kU/L Final  . Allergen, Cedar tree, t12 04/15/2017 <0.10  kU/L Final  . Allergen, Oak,t7 04/15/2017 0.13* kU/L Final  . Elm IgE  04/15/2017 <0.10  kU/L Final  . Allergen, Cottonwood, t14 04/15/2017 <0.10  kU/L Final  . Pecan/Hickory Tree IgE 04/15/2017 <0.10  kU/L Final  . Allergen, Mulberry, t76 04/15/2017 <0.10  kU/L Final  . Common Ragweed 04/15/2017 <0.10  kU/L Final  . Rough Pigweed  IgE 04/15/2017 <0.10  kU/L Final  . Sheep Sorrel IgE 04/15/2017 <0.10  kU/L Final  . IgE (Immunoglobulin E), Serum 04/15/2017 117* <115 kU/L Final  . Allergen, Mouse Urine Protein, e78 04/15/2017 <0.10  kU/L Final  . D-Dimer, Quant 04/15/2017 0.95* <0.50 mcg/mL FEU Final   Comment:   The D-Dimer test is used frequently to exclude an acute PE or DVT.  In patients with a low to moderate clinical risk assessment and a D-Dimer result <0.50 mcg/mL FEU, the likelihood of a PE or DVT is very low.  However, a thromboembolic event should not be excluded solely on the basis of the D-Dimer level.  Increased levels of D-Dimer are associated with a PE, DVT, DIC, malignancies, inflammation, sepsis, surgery,  trauma, pregnancy, and advancing patient age. [Jama 2006 11:295(2): 233-007]   For additional information, please refer to: http://education.questdiagnostics.com/faq/FAQ149 (This link is being provided for information/ educational purposes only)     Footnotes:  (1)        ----------------------------------------------------      Class   Specific IgE (kU/L)   Level      ----------------------------------------------------      0       <0.10                 Absent or undetectable      0/1     0.10  -   0.34        Equivocal/Borderline      1       0                          .35  -   0.69        Low      2       0.70  -   3.49        Moderate      3       3.50  -  17.49        High      4       17.50 -  49.99        Very High      5       50.00 - 100.00        Very High      6       >100.00               Very High     Dg Chest 2 View  Result Date: 06/28/2017 CLINICAL DATA:  Dizziness and shortness of breath tonight. EXAM:  CHEST  2 VIEW COMPARISON:  05/24/2017 FINDINGS: Cardiac enlargement. No vascular congestion or edema. No focal consolidation. No blunting of costophrenic angles. No pneumothorax. Tortuous aorta. Degenerative changes in the spine. midthoracic vertebral compression deformities similar to previous study. IMPRESSION: Cardiac enlargement.  No evidence of active pulmonary disease. Electronically Signed   By: Lucienne Capers M.D.   On: 06/28/2017 04:19   Dg Hip Unilat With Pelvis 2-3 Views Right  Result Date: 06/28/2017 CLINICAL DATA:  Right leg pain.  Fall 1 month ago. EXAM: DG HIP (WITH OR WITHOUT PELVIS) 2-3V RIGHT COMPARISON:  None. FINDINGS: Early symmetric degenerative changes in the hips with early spurring. Joint spaces are maintained. SI joints are symmetric and unremarkable. No acute bony abnormality. Specifically, no fracture, subluxation, or dislocation. Soft tissues are intact. IMPRESSION: Early symmetric degenerative changes.  No acute bony abnormality. Electronically Signed   By: Rolm Baptise M.D.   On: 06/28/2017 10:05     Assessment/Plan   ICD-10-CM   1. Shortness of breath R06.02 Ambulatory referral to Home Health  2. Tardive dyskinesia G24.01   3. Bipolar 1 disorder ([place]) F31.9   4. Spinal stenosis at L4-L5 level M48.061 Ambulatory referral to [place]  5. Weakness of right lower extremity R29.898 Ambulatory referral to Home Health  6. Essential hypertension, benign I10   7. Pulmonary nodules/lesions, multiple R91.8    Continue current medications as ordered  Home Health as ordered - family would like to change companies. Family also interested in PACE/CAP programs  Follow up with specialists as scheduled  Recommend contact Psych regarding adjusting Latuda and/or cogentin dose  Follow up in 1 month for SOB, RLE weakness, and back pain  TIME SPENT: 45 minutes with >50% time coordinating care of pt  Chasten Blaze S. Perlie Gold  [facility] [address] [place], [place] [address] [phone] Cell (Monday-Friday 8 AM - 5 PM) [phone] After 5 PM and follow prompts

## 2017-07-06 NOTE — Progress Notes (Signed)
Subjective:   Judy [name] is a 71 y.o. female who presents for an Initial Medicare Annual Wellness Visit.        Objective:    Today's Vitals   07/06/17 1251  BP: 118/74  Pulse: 75  Temp: 98.9 F (37.2 C)  TempSrc: Oral  SpO2: 96%  Weight: 228 lb 3.2 oz (103.5 kg)  Height: 5\' 9"  (1.753 m)  PainSc: 8    Body mass index is 33.7 kg/m.   Current Medications (verified) Outpatient Encounter Prescriptions as of 07/06/2017  Medication Sig  . acetaminophen (TYLENOL) 500 MG tablet Take 500 mg by mouth daily as needed for moderate pain.  [name] amLODipine (NORVASC) 5 MG tablet Take 1 tablet (5 mg total) by mouth daily.  . benztropine (COGENTIN) 1 MG tablet Take 1 tablet (1 mg total) by mouth 2 (two) times daily.  [name] buPROPion (WELLBUTRIN XL) 150 MG 24 hr tablet Take 150 mg by mouth every morning. Take one tablet every morning  . famotidine (PEPCID) 20 MG tablet One at bedtime (Patient taking differently: Take by mouth at bedtime. )  . FLUoxetine HCl 60 MG TABS Take 60 mg by mouth every morning.  [name] LATUDA 120 MG TABS Take 120 mg by mouth at bedtime. With 350 calories  . linaclotide (LINZESS) 145 MCG CAPS capsule Take 145 mcg by mouth daily as needed (constipation).  . meclizine (ANTIVERT) 25 MG tablet Take 25 mg by mouth 3 (three) times daily as needed for dizziness.  . Menthol, Topical Analgesic, (BIOFREEZE) 4 % GEL Apply 1 application topically 2 (two) times daily as needed (pain).  . pantoprazole (PROTONIX) 40 MG tablet TAKE 1 TABLET BY MOUTH DAILY. TAKE 30-60 MIN BEFORE FIRST MEAL OF THE DAY  . bisacodyl (DULCOLAX) 10 MG suppository Place 1 suppository (10 mg total) rectally daily as needed for moderate constipation. (Patient not taking: Reported on 07/06/2017)  . naproxen sodium (ANAPROX) 220 MG tablet Take 440 mg by mouth daily as needed (pain).    No facility-administered encounter medications on file as of 07/06/2017.     Allergies (verified) Patient has no known allergies.    History: Past Medical History:  Diagnosis Date  . Allergic rhinitis   . Anemia   . Anxiety   . Arthritis   . Bipolar 1 disorder ([place])   . Depression   . Diverticulosis of colon (without mention of hemorrhage) 08/25/2011   [name]  . 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 Hyperlipidemia   . Hypertension   . Obesity   . Vertigo    Past Surgical History:  Procedure Laterality Date  . COLONOSCOPY  08/25/2011   Procedure: COLONOSCOPY;  Surgeon: Landry Dyke, MD;  Location: [facility];  Service: Endoscopy;  Laterality: N/A;  . DILATION AND CURETTAGE OF UTERUS  1960's   Family History  Problem Relation Age of Onset  . Prostate cancer Brother   . Hypertension Brother   . Kidney disease Brother   . Colon cancer Brother   . Hypertension Mother   . Kidney disease Mother   . Stomach cancer Father   . Hyperlipidemia Sister   . Hypothyroidism Sister   . Stroke Brother   . Prostate cancer Brother    Social History   Occupational History  . Retired    Social History Main Topics  . Smoking status: Never Smoker  . Smokeless tobacco: Never Used  . Alcohol use No  . Drug use: No  .  Sexual activity: No    Tobacco Counseling Counseling given: Not Answered   Activities of Daily Living In your present state of health, do you have any difficulty performing the following activities: 07/06/2017 05/25/2017  Hearing? Y Y  Comment - -  Vision? N N  Comment - -  Difficulty concentrating or making decisions? Y N  Walking or climbing stairs? Y Y  Comment - -  Dressing or bathing? Y N  Doing errands, shopping? Tempie Donning  Preparing Food and eating ? Y -  Using the Toilet? Y -  In the past six months, have you accidently leaked urine? Y -  Do you have problems with loss of bowel control? Y -  Managing your Medications? Y -  Managing your Finances? Y -  Housekeeping or managing your Housekeeping? Y -  Some recent data might be hidden    Immunizations and Health  Maintenance Immunization History  Administered Date(s) Administered  . Influenza,inj,Quad PF,6+ Mos 12/11/2013, 09/12/2015, 07/20/2016  . Pneumococcal Polysaccharide-23 12/11/2013   Health Maintenance Due  Topic Date Due  . Hepatitis C Screening  1945-10-21  . TETANUS/TDAP  07/09/1966  . PNA vac Low Risk Adult (2 of 2 - PCV13) 12/12/2014    Patient Care Team: [name], DO as PCP - General (Internal Medicine) Kristeen Miss, MD as Consulting Physician (Neurosurgery) Cottle, Billey Co., MD as Attending Physician (Psychiatry)  Indicate any recent Medical Services you may have received from other than [facility] providers in the past year (date may be approximate).     Assessment:   This is a routine wellness examination for [name].   Hearing/Vision screen No exam data present  Dietary issues and exercise activities discussed: Current Exercise Habits: The patient does not participate in regular exercise at present, Exercise limited by: orthopedic condition(s)  Goals    None     Depression Screen PHQ 2/9 Scores 07/06/2017 03/25/2017 06/04/2014  PHQ - 2 Score 4 1 2   PHQ- 9 Score 11 - 3    Fall Risk Fall Risk  07/06/2017 05/16/2017 03/25/2017 07/20/2016 09/12/2015  Falls in the past year? Yes No No No No  Number falls in past yr: 1 - - - -  Injury with Fall? No - - - -    Cognitive Function: MMSE - Mini Mental State Exam 07/06/2017 03/25/2017 06/04/2014  Orientation to time 5 5 5   Orientation to Place 5 5 5   Registration 3 3 3   Attention/ Calculation 4 4 5   Recall 1 2 2   Language- name 2 objects 2 2 2   Language- repeat 1 1 1   Language- follow 3 step command 3 3 3   Language- read & follow direction 1 1 1   Write a sentence 1 1 1   Copy design 1 0 0  Total score 27 27 28         Screening Tests Health Maintenance  Topic Date Due  . Hepatitis C Screening  06/10/1946  . TETANUS/TDAP  07/09/1966  . PNA vac Low Risk Adult (2 of 2 - PCV13) 12/12/2014  . INFLUENZA VACCINE   07/11/2017 (Originally 05/11/2017)  . MAMMOGRAM  08/02/2018  . COLONOSCOPY  08/24/2021  . DEXA SCAN  Completed      Plan:    I have personally reviewed and addressed the Medicare Annual Wellness questionnaire and have noted the following in the patient's chart:  A. Medical and social history B. Use of alcohol, tobacco or illicit drugs  C. Current medications and supplements D. Functional ability  and status E.  Nutritional status F.  Physical activity G. Advance directives H. List of other physicians I.  Hospitalizations, surgeries, and ER visits in previous 12 months J.  [place] to include hearing, vision, cognitive, depression L. Referrals and appointments - none  In addition, I have reviewed and discussed with patient certain preventive protocols, quality metrics, and best practice recommendations. A written personalized care plan for preventive services as well as general preventive health recommendations were provided to patient.  See attached scanned questionnaire for additional information.   Signed,   Rich Reining, RN Nurse Health Advisor   Quick Notes   Health Maintenance: Flu vaccine, prevnar, tdap, hep c screen due. Vaccine declined at this time.     Abnormal Screen: PHQ-11, MMSE 27/30 Did not pass clock drawing     Patient Concerns: Medication review, breathing, tremors     Nurse Concerns: Caregiver burden of family

## 2017-07-06 NOTE — Patient Instructions (Signed)
Judy [name] , Thank you for taking time to come for your Medicare Wellness Visit. I appreciate your ongoing commitment to your health goals. Please review the following plan we discussed and let me know if I can assist you in the future.   Screening recommendations/referrals: Colonoscopy up to date. Due 08/24/21 Mammogram up to date. Due 08/20/2018 Bone Density up to date Recommended yearly ophthalmology/optometry visit for glaucoma screening and checkup Recommended yearly dental visit for hygiene and checkup  Vaccinations: Influenza vaccine due, declined at this time Pneumococcal vaccine 13 due, declined at this time Tdap vaccine due, declined at this time Shingles vaccine due, declined at this time  Advanced directives: In Chart  Next appointment: Dr. Eulas Post, 07/06/2017 @ 1:45pm   Preventive Care 65 Years and Older, Female Preventive care refers to lifestyle choices and visits with your health care provider that can promote health and wellness. What does preventive care include?  A yearly physical exam. This is also called an annual well check.  Dental exams once or twice a year.  Routine eye exams. Ask your health care provider how often you should have your eyes checked.  Personal lifestyle choices, including:  Daily care of your teeth and gums.  Regular physical activity.  Eating a healthy diet.  Avoiding tobacco and drug use.  Limiting alcohol use.  Practicing safe sex.  Taking low-dose aspirin every day.  Taking vitamin and mineral supplements as recommended by your health care provider. What happens during an annual well check? The services and screenings done by your health care provider during your annual well check will depend on your age, overall health, lifestyle risk factors, and family history of disease. Counseling  Your health care provider may ask you questions about your:  Alcohol use.  Tobacco use.  Drug use.  Emotional well-being.  Home  and relationship well-being.  Sexual activity.  Eating habits.  History of falls.  Memory and ability to understand (cognition).  Work and work Statistician.  Reproductive health. Screening  You may have the following tests or measurements:  Height, weight, and BMI.  Blood pressure.  Lipid and cholesterol levels. These may be checked every 5 years, or more frequently if you are over 25 years old.  Skin check.  Lung cancer screening. You may have this screening every year starting at age 35 if you have a 30-pack-year history of smoking and currently smoke or have quit within the past 15 years.  Fecal occult blood test (FOBT) of the stool. You may have this test every year starting at age 97.  Flexible sigmoidoscopy or colonoscopy. You may have a sigmoidoscopy every 5 years or a colonoscopy every 10 years starting at age 90.  Hepatitis C blood test.  Hepatitis B blood test.  Sexually transmitted disease (STD) testing.  Diabetes screening. This is done by checking your blood sugar (glucose) after you have not eaten for a while (fasting). You may have this done every 1-3 years.  Bone density scan. This is done to screen for osteoporosis. You may have this done starting at age 24.  Mammogram. This may be done every 1-2 years. Talk to your health care provider about how often you should have regular mammograms. Talk with your health care provider about your test results, treatment options, and if necessary, the need for more tests. Vaccines  Your health care provider may recommend certain vaccines, such as:  Influenza vaccine. This is recommended every year.  Tetanus, diphtheria, and acellular pertussis (Tdap, Td) vaccine.  You may need a Td booster every 10 years.  Zoster vaccine. You may need this after age 13.  Pneumococcal 13-valent conjugate (PCV13) vaccine. One dose is recommended after age 69.  Pneumococcal polysaccharide (PPSV23) vaccine. One dose is recommended  after age 52. Talk to your health care provider about which screenings and vaccines you need and how often you need them. This information is not intended to replace advice given to you by your health care provider. Make sure you discuss any questions you have with your health care provider. Document Released: 10/24/2015 Document Revised: 06/16/2016 Document Reviewed: 07/29/2015 Elsevier Interactive Patient Education  2017 [place] Prevention in the Home Falls can cause injuries. They can happen to people of all ages. There are many things you can do to make your home safe and to help prevent falls. What can I do on the outside of my home?  Regularly fix the edges of walkways and driveways and fix any cracks.  Remove anything that might make you trip as you walk through a door, such as a raised step or threshold.  Trim any bushes or trees on the path to your home.  Use bright outdoor lighting.  Clear any walking paths of anything that might make someone trip, such as rocks or tools.  Regularly check to see if handrails are loose or broken. Make sure that both sides of any steps have handrails.  Any raised decks and porches should have guardrails on the edges.  Have any leaves, snow, or ice cleared regularly.  Use sand or salt on walking paths during winter.  Clean up any spills in your garage right away. This includes oil or grease spills. What can I do in the bathroom?  Use night lights.  Install grab bars by the toilet and in the tub and shower. Do not use towel bars as grab bars.  Use non-skid mats or decals in the tub or shower.  If you need to sit down in the shower, use a plastic, non-slip stool.  Keep the floor dry. Clean up any water that spills on the floor as soon as it happens.  Remove soap buildup in the tub or shower regularly.  Attach bath mats securely with double-sided non-slip rug tape.  Do not have throw rugs and other things on the floor  that can make you trip. What can I do in the bedroom?  Use night lights.  Make sure that you have a light by your bed that is easy to reach.  Do not use any sheets or blankets that are too big for your bed. They should not hang down onto the floor.  Have a firm chair that has side arms. You can use this for support while you get dressed.  Do not have throw rugs and other things on the floor that can make you trip. What can I do in the kitchen?  Clean up any spills right away.  Avoid walking on wet floors.  Keep items that you use a lot in easy-to-reach places.  If you need to reach something above you, use a strong step stool that has a grab bar.  Keep electrical cords out of the way.  Do not use floor polish or wax that makes floors slippery. If you must use wax, use non-skid floor wax.  Do not have throw rugs and other things on the floor that can make you trip. What can I do with my stairs?  Do not leave any  items on the stairs.  Make sure that there are handrails on both sides of the stairs and use them. Fix handrails that are broken or loose. Make sure that handrails are as long as the stairways.  Check any carpeting to make sure that it is firmly attached to the stairs. Fix any carpet that is loose or worn.  Avoid having throw rugs at the top or bottom of the stairs. If you do have throw rugs, attach them to the floor with carpet tape.  Make sure that you have a light switch at the top of the stairs and the bottom of the stairs. If you do not have them, ask someone to add them for you. What else can I do to help prevent falls?  Wear shoes that:  Do not have high heels.  Have rubber bottoms.  Are comfortable and fit you well.  Are closed at the toe. Do not wear sandals.  If you use a stepladder:  Make sure that it is fully opened. Do not climb a closed stepladder.  Make sure that both sides of the stepladder are locked into place.  Ask someone to hold it  for you, if possible.  Clearly mark and make sure that you can see:  Any grab bars or handrails.  First and last steps.  Where the edge of each step is.  Use tools that help you move around (mobility aids) if they are needed. These include:  Canes.  Walkers.  Scooters.  Crutches.  Turn on the lights when you go into a dark area. Replace any light bulbs as soon as they burn out.  Set up your furniture so you have a clear path. Avoid moving your furniture around.  If any of your floors are uneven, fix them.  If there are any pets around you, be aware of where they are.  Review your medicines with your doctor. Some medicines can make you feel dizzy. This can increase your chance of falling. Ask your doctor what other things that you can do to help prevent falls. This information is not intended to replace advice given to you by your health care provider. Make sure you discuss any questions you have with your health care provider. Document Released: 07/24/2009 Document Revised: 03/04/2016 Document Reviewed: 11/01/2014 Elsevier Interactive Patient Education  2017 [employer].

## 2017-07-07 DIAGNOSIS — G2401 Drug induced subacute dyskinesia: Secondary | ICD-10-CM | POA: Diagnosis not present

## 2017-07-07 DIAGNOSIS — E669 Obesity, unspecified: Secondary | ICD-10-CM | POA: Diagnosis not present

## 2017-07-07 DIAGNOSIS — D649 Anemia, unspecified: Secondary | ICD-10-CM | POA: Diagnosis not present

## 2017-07-07 DIAGNOSIS — E876 Hypokalemia: Secondary | ICD-10-CM | POA: Diagnosis not present

## 2017-07-07 DIAGNOSIS — R531 Weakness: Secondary | ICD-10-CM | POA: Diagnosis not present

## 2017-07-07 DIAGNOSIS — I10 Essential (primary) hypertension: Secondary | ICD-10-CM | POA: Diagnosis not present

## 2017-07-07 DIAGNOSIS — R69 Illness, unspecified: Secondary | ICD-10-CM | POA: Diagnosis not present

## 2017-07-07 DIAGNOSIS — R1310 Dysphagia, unspecified: Secondary | ICD-10-CM | POA: Diagnosis not present

## 2017-07-07 DIAGNOSIS — E785 Hyperlipidemia, unspecified: Secondary | ICD-10-CM | POA: Diagnosis not present

## 2017-07-08 DIAGNOSIS — I10 Essential (primary) hypertension: Secondary | ICD-10-CM | POA: Diagnosis not present

## 2017-07-08 DIAGNOSIS — E669 Obesity, unspecified: Secondary | ICD-10-CM | POA: Diagnosis not present

## 2017-07-08 DIAGNOSIS — E785 Hyperlipidemia, unspecified: Secondary | ICD-10-CM | POA: Diagnosis not present

## 2017-07-08 DIAGNOSIS — G2401 Drug induced subacute dyskinesia: Secondary | ICD-10-CM | POA: Diagnosis not present

## 2017-07-08 DIAGNOSIS — R69 Illness, unspecified: Secondary | ICD-10-CM | POA: Diagnosis not present

## 2017-07-08 DIAGNOSIS — E876 Hypokalemia: Secondary | ICD-10-CM | POA: Diagnosis not present

## 2017-07-08 DIAGNOSIS — R1310 Dysphagia, unspecified: Secondary | ICD-10-CM | POA: Diagnosis not present

## 2017-07-08 DIAGNOSIS — R531 Weakness: Secondary | ICD-10-CM | POA: Diagnosis not present

## 2017-07-08 DIAGNOSIS — D649 Anemia, unspecified: Secondary | ICD-10-CM | POA: Diagnosis not present

## 2017-07-09 DIAGNOSIS — I10 Essential (primary) hypertension: Secondary | ICD-10-CM | POA: Diagnosis not present

## 2017-07-09 DIAGNOSIS — R531 Weakness: Secondary | ICD-10-CM | POA: Diagnosis not present

## 2017-07-09 DIAGNOSIS — R1310 Dysphagia, unspecified: Secondary | ICD-10-CM | POA: Diagnosis not present

## 2017-07-09 DIAGNOSIS — D649 Anemia, unspecified: Secondary | ICD-10-CM | POA: Diagnosis not present

## 2017-07-09 DIAGNOSIS — E669 Obesity, unspecified: Secondary | ICD-10-CM | POA: Diagnosis not present

## 2017-07-09 DIAGNOSIS — E785 Hyperlipidemia, unspecified: Secondary | ICD-10-CM | POA: Diagnosis not present

## 2017-07-09 DIAGNOSIS — G2401 Drug induced subacute dyskinesia: Secondary | ICD-10-CM | POA: Diagnosis not present

## 2017-07-09 DIAGNOSIS — R69 Illness, unspecified: Secondary | ICD-10-CM | POA: Diagnosis not present

## 2017-07-09 DIAGNOSIS — E876 Hypokalemia: Secondary | ICD-10-CM | POA: Diagnosis not present

## 2017-07-11 ENCOUNTER — Telehealth: Payer: Self-pay | Admitting: *Deleted

## 2017-07-11 DIAGNOSIS — R69 Illness, unspecified: Secondary | ICD-10-CM | POA: Diagnosis not present

## 2017-07-11 DIAGNOSIS — R531 Weakness: Secondary | ICD-10-CM | POA: Diagnosis not present

## 2017-07-11 DIAGNOSIS — N183 Chronic kidney disease, stage 3 unspecified: Secondary | ICD-10-CM

## 2017-07-11 DIAGNOSIS — D649 Anemia, unspecified: Secondary | ICD-10-CM | POA: Diagnosis not present

## 2017-07-11 DIAGNOSIS — E876 Hypokalemia: Secondary | ICD-10-CM | POA: Diagnosis not present

## 2017-07-11 DIAGNOSIS — G2401 Drug induced subacute dyskinesia: Secondary | ICD-10-CM | POA: Diagnosis not present

## 2017-07-11 DIAGNOSIS — M7989 Other specified soft tissue disorders: Secondary | ICD-10-CM

## 2017-07-11 DIAGNOSIS — E669 Obesity, unspecified: Secondary | ICD-10-CM | POA: Diagnosis not present

## 2017-07-11 DIAGNOSIS — R1310 Dysphagia, unspecified: Secondary | ICD-10-CM | POA: Diagnosis not present

## 2017-07-11 DIAGNOSIS — I10 Essential (primary) hypertension: Secondary | ICD-10-CM | POA: Diagnosis not present

## 2017-07-11 DIAGNOSIS — E785 Hyperlipidemia, unspecified: Secondary | ICD-10-CM | POA: Diagnosis not present

## 2017-07-11 NOTE — Telephone Encounter (Signed)
Judy [name] called in reference to patient. She was asking for a nephrologist referral since she has Stage III kidney disease. She said patient is experiencing swelling and her skin is darkening and they would just like to see one for a second opinion. She doesn't have anyone particular in mind.

## 2017-07-11 NOTE — Telephone Encounter (Signed)
Referral placed.

## 2017-07-11 NOTE — Telephone Encounter (Signed)
Ok to refer to nephrology for CKD stage 3 with LE swelling

## 2017-07-12 DIAGNOSIS — E669 Obesity, unspecified: Secondary | ICD-10-CM | POA: Diagnosis not present

## 2017-07-12 DIAGNOSIS — E876 Hypokalemia: Secondary | ICD-10-CM | POA: Diagnosis not present

## 2017-07-12 DIAGNOSIS — R531 Weakness: Secondary | ICD-10-CM | POA: Diagnosis not present

## 2017-07-12 DIAGNOSIS — R1310 Dysphagia, unspecified: Secondary | ICD-10-CM | POA: Diagnosis not present

## 2017-07-12 DIAGNOSIS — I10 Essential (primary) hypertension: Secondary | ICD-10-CM | POA: Diagnosis not present

## 2017-07-12 DIAGNOSIS — E785 Hyperlipidemia, unspecified: Secondary | ICD-10-CM | POA: Diagnosis not present

## 2017-07-12 DIAGNOSIS — D649 Anemia, unspecified: Secondary | ICD-10-CM | POA: Diagnosis not present

## 2017-07-12 DIAGNOSIS — G2401 Drug induced subacute dyskinesia: Secondary | ICD-10-CM | POA: Diagnosis not present

## 2017-07-12 DIAGNOSIS — R69 Illness, unspecified: Secondary | ICD-10-CM | POA: Diagnosis not present

## 2017-07-13 DIAGNOSIS — R69 Illness, unspecified: Secondary | ICD-10-CM | POA: Diagnosis not present

## 2017-07-13 DIAGNOSIS — E876 Hypokalemia: Secondary | ICD-10-CM | POA: Diagnosis not present

## 2017-07-13 DIAGNOSIS — R1310 Dysphagia, unspecified: Secondary | ICD-10-CM | POA: Diagnosis not present

## 2017-07-13 DIAGNOSIS — E785 Hyperlipidemia, unspecified: Secondary | ICD-10-CM | POA: Diagnosis not present

## 2017-07-13 DIAGNOSIS — R531 Weakness: Secondary | ICD-10-CM | POA: Diagnosis not present

## 2017-07-13 DIAGNOSIS — G2401 Drug induced subacute dyskinesia: Secondary | ICD-10-CM | POA: Diagnosis not present

## 2017-07-13 DIAGNOSIS — E669 Obesity, unspecified: Secondary | ICD-10-CM | POA: Diagnosis not present

## 2017-07-13 DIAGNOSIS — D649 Anemia, unspecified: Secondary | ICD-10-CM | POA: Diagnosis not present

## 2017-07-13 DIAGNOSIS — I10 Essential (primary) hypertension: Secondary | ICD-10-CM | POA: Diagnosis not present

## 2017-07-14 DIAGNOSIS — I10 Essential (primary) hypertension: Secondary | ICD-10-CM | POA: Diagnosis not present

## 2017-07-14 DIAGNOSIS — R531 Weakness: Secondary | ICD-10-CM | POA: Diagnosis not present

## 2017-07-14 DIAGNOSIS — R69 Illness, unspecified: Secondary | ICD-10-CM | POA: Diagnosis not present

## 2017-07-14 DIAGNOSIS — G2401 Drug induced subacute dyskinesia: Secondary | ICD-10-CM | POA: Diagnosis not present

## 2017-07-14 DIAGNOSIS — E669 Obesity, unspecified: Secondary | ICD-10-CM | POA: Diagnosis not present

## 2017-07-14 DIAGNOSIS — E785 Hyperlipidemia, unspecified: Secondary | ICD-10-CM | POA: Diagnosis not present

## 2017-07-14 DIAGNOSIS — E876 Hypokalemia: Secondary | ICD-10-CM | POA: Diagnosis not present

## 2017-07-14 DIAGNOSIS — D649 Anemia, unspecified: Secondary | ICD-10-CM | POA: Diagnosis not present

## 2017-07-14 DIAGNOSIS — R1310 Dysphagia, unspecified: Secondary | ICD-10-CM | POA: Diagnosis not present

## 2017-07-15 ENCOUNTER — Telehealth: Payer: Self-pay | Admitting: *Deleted

## 2017-07-15 ENCOUNTER — Other Ambulatory Visit: Payer: Self-pay | Admitting: Internal Medicine

## 2017-07-15 DIAGNOSIS — Z1231 Encounter for screening mammogram for malignant neoplasm of breast: Secondary | ICD-10-CM

## 2017-07-15 NOTE — Telephone Encounter (Signed)
On her last exam, she did not have any swelling in her extremities and therefore I do not recommend a fluid pill.

## 2017-07-15 NOTE — Telephone Encounter (Signed)
Harriett notified and agreed. Confirmed next appointment.

## 2017-07-15 NOTE — Telephone Encounter (Signed)
Patient caregiver, Harriett called and stated that she called and spoke with [facility] and they told her that they will get patient an appointment when they get the other people in front of her scheduled. Stated that because other people are more critical than she is and she was listed as a 4.   In the mean time, while waiting for an appointment, can you prescribe something for patient's swelling and her skin changing dark in her legs and feet. Please Advise.

## 2017-07-18 DIAGNOSIS — R1310 Dysphagia, unspecified: Secondary | ICD-10-CM | POA: Diagnosis not present

## 2017-07-18 DIAGNOSIS — R69 Illness, unspecified: Secondary | ICD-10-CM | POA: Diagnosis not present

## 2017-07-18 DIAGNOSIS — G2401 Drug induced subacute dyskinesia: Secondary | ICD-10-CM | POA: Diagnosis not present

## 2017-07-18 DIAGNOSIS — E785 Hyperlipidemia, unspecified: Secondary | ICD-10-CM | POA: Diagnosis not present

## 2017-07-18 DIAGNOSIS — E669 Obesity, unspecified: Secondary | ICD-10-CM | POA: Diagnosis not present

## 2017-07-18 DIAGNOSIS — D649 Anemia, unspecified: Secondary | ICD-10-CM | POA: Diagnosis not present

## 2017-07-18 DIAGNOSIS — E876 Hypokalemia: Secondary | ICD-10-CM | POA: Diagnosis not present

## 2017-07-18 DIAGNOSIS — R531 Weakness: Secondary | ICD-10-CM | POA: Diagnosis not present

## 2017-07-18 DIAGNOSIS — I10 Essential (primary) hypertension: Secondary | ICD-10-CM | POA: Diagnosis not present

## 2017-07-20 DIAGNOSIS — G2401 Drug induced subacute dyskinesia: Secondary | ICD-10-CM | POA: Diagnosis not present

## 2017-07-20 DIAGNOSIS — R1310 Dysphagia, unspecified: Secondary | ICD-10-CM | POA: Diagnosis not present

## 2017-07-20 DIAGNOSIS — I10 Essential (primary) hypertension: Secondary | ICD-10-CM | POA: Diagnosis not present

## 2017-07-20 DIAGNOSIS — E669 Obesity, unspecified: Secondary | ICD-10-CM | POA: Diagnosis not present

## 2017-07-20 DIAGNOSIS — R69 Illness, unspecified: Secondary | ICD-10-CM | POA: Diagnosis not present

## 2017-07-20 DIAGNOSIS — E785 Hyperlipidemia, unspecified: Secondary | ICD-10-CM | POA: Diagnosis not present

## 2017-07-20 DIAGNOSIS — R531 Weakness: Secondary | ICD-10-CM | POA: Diagnosis not present

## 2017-07-20 DIAGNOSIS — E876 Hypokalemia: Secondary | ICD-10-CM | POA: Diagnosis not present

## 2017-07-20 DIAGNOSIS — D649 Anemia, unspecified: Secondary | ICD-10-CM | POA: Diagnosis not present

## 2017-07-26 DIAGNOSIS — E876 Hypokalemia: Secondary | ICD-10-CM | POA: Diagnosis not present

## 2017-07-26 DIAGNOSIS — R1310 Dysphagia, unspecified: Secondary | ICD-10-CM | POA: Diagnosis not present

## 2017-07-26 DIAGNOSIS — E785 Hyperlipidemia, unspecified: Secondary | ICD-10-CM | POA: Diagnosis not present

## 2017-07-26 DIAGNOSIS — E669 Obesity, unspecified: Secondary | ICD-10-CM | POA: Diagnosis not present

## 2017-07-26 DIAGNOSIS — I10 Essential (primary) hypertension: Secondary | ICD-10-CM | POA: Diagnosis not present

## 2017-07-26 DIAGNOSIS — G2401 Drug induced subacute dyskinesia: Secondary | ICD-10-CM | POA: Diagnosis not present

## 2017-07-26 DIAGNOSIS — D649 Anemia, unspecified: Secondary | ICD-10-CM | POA: Diagnosis not present

## 2017-07-26 DIAGNOSIS — R69 Illness, unspecified: Secondary | ICD-10-CM | POA: Diagnosis not present

## 2017-07-26 DIAGNOSIS — R531 Weakness: Secondary | ICD-10-CM | POA: Diagnosis not present

## 2017-07-27 DIAGNOSIS — R69 Illness, unspecified: Secondary | ICD-10-CM | POA: Diagnosis not present

## 2017-07-27 DIAGNOSIS — E876 Hypokalemia: Secondary | ICD-10-CM | POA: Diagnosis not present

## 2017-07-27 DIAGNOSIS — I10 Essential (primary) hypertension: Secondary | ICD-10-CM | POA: Diagnosis not present

## 2017-07-27 DIAGNOSIS — R531 Weakness: Secondary | ICD-10-CM | POA: Diagnosis not present

## 2017-07-27 DIAGNOSIS — E669 Obesity, unspecified: Secondary | ICD-10-CM | POA: Diagnosis not present

## 2017-07-27 DIAGNOSIS — G2401 Drug induced subacute dyskinesia: Secondary | ICD-10-CM | POA: Diagnosis not present

## 2017-07-27 DIAGNOSIS — R1310 Dysphagia, unspecified: Secondary | ICD-10-CM | POA: Diagnosis not present

## 2017-07-27 DIAGNOSIS — E785 Hyperlipidemia, unspecified: Secondary | ICD-10-CM | POA: Diagnosis not present

## 2017-07-27 DIAGNOSIS — D649 Anemia, unspecified: Secondary | ICD-10-CM | POA: Diagnosis not present

## 2017-07-28 ENCOUNTER — Telehealth: Payer: Self-pay

## 2017-07-28 DIAGNOSIS — R1310 Dysphagia, unspecified: Secondary | ICD-10-CM | POA: Diagnosis not present

## 2017-07-28 DIAGNOSIS — I10 Essential (primary) hypertension: Secondary | ICD-10-CM | POA: Diagnosis not present

## 2017-07-28 DIAGNOSIS — E785 Hyperlipidemia, unspecified: Secondary | ICD-10-CM | POA: Diagnosis not present

## 2017-07-28 DIAGNOSIS — E669 Obesity, unspecified: Secondary | ICD-10-CM | POA: Diagnosis not present

## 2017-07-28 DIAGNOSIS — R531 Weakness: Secondary | ICD-10-CM | POA: Diagnosis not present

## 2017-07-28 DIAGNOSIS — G2401 Drug induced subacute dyskinesia: Secondary | ICD-10-CM | POA: Diagnosis not present

## 2017-07-28 DIAGNOSIS — D649 Anemia, unspecified: Secondary | ICD-10-CM | POA: Diagnosis not present

## 2017-07-28 DIAGNOSIS — E876 Hypokalemia: Secondary | ICD-10-CM | POA: Diagnosis not present

## 2017-07-28 DIAGNOSIS — R69 Illness, unspecified: Secondary | ICD-10-CM | POA: Diagnosis not present

## 2017-07-28 NOTE — Telephone Encounter (Signed)
Mickel Baas with [facility] called to request verbal orders for   PT- 2 x weekly for 4 weeks OT- Evaluation Home health aid- 2 x weekly x 4 weeks  Per [facility] standing order, verbal order given. Message will be sent to patient's provider as a FYI.    FYI: Patient fell last night, no bruising, patient will call if she feels the need to be seen.  Left message on voicemail as a reminder for patient to call if she needs to be accessed

## 2017-07-29 DIAGNOSIS — D649 Anemia, unspecified: Secondary | ICD-10-CM | POA: Diagnosis not present

## 2017-07-29 DIAGNOSIS — E669 Obesity, unspecified: Secondary | ICD-10-CM | POA: Diagnosis not present

## 2017-07-29 DIAGNOSIS — R69 Illness, unspecified: Secondary | ICD-10-CM | POA: Diagnosis not present

## 2017-07-29 DIAGNOSIS — I10 Essential (primary) hypertension: Secondary | ICD-10-CM | POA: Diagnosis not present

## 2017-07-29 DIAGNOSIS — F419 Anxiety disorder, unspecified: Secondary | ICD-10-CM | POA: Diagnosis not present

## 2017-07-29 DIAGNOSIS — G2401 Drug induced subacute dyskinesia: Secondary | ICD-10-CM | POA: Diagnosis not present

## 2017-07-29 DIAGNOSIS — F319 Bipolar disorder, unspecified: Secondary | ICD-10-CM | POA: Diagnosis not present

## 2017-07-29 DIAGNOSIS — R531 Weakness: Secondary | ICD-10-CM | POA: Diagnosis not present

## 2017-07-29 DIAGNOSIS — K581 Irritable bowel syndrome with constipation: Secondary | ICD-10-CM | POA: Diagnosis not present

## 2017-07-29 DIAGNOSIS — Z9181 History of falling: Secondary | ICD-10-CM | POA: Diagnosis not present

## 2017-07-29 DIAGNOSIS — K5790 Diverticulosis of intestine, part unspecified, without perforation or abscess without bleeding: Secondary | ICD-10-CM | POA: Diagnosis not present

## 2017-08-02 DIAGNOSIS — R531 Weakness: Secondary | ICD-10-CM | POA: Diagnosis not present

## 2017-08-02 DIAGNOSIS — G2401 Drug induced subacute dyskinesia: Secondary | ICD-10-CM | POA: Diagnosis not present

## 2017-08-02 DIAGNOSIS — K5790 Diverticulosis of intestine, part unspecified, without perforation or abscess without bleeding: Secondary | ICD-10-CM | POA: Diagnosis not present

## 2017-08-02 DIAGNOSIS — K581 Irritable bowel syndrome with constipation: Secondary | ICD-10-CM | POA: Diagnosis not present

## 2017-08-02 DIAGNOSIS — D649 Anemia, unspecified: Secondary | ICD-10-CM | POA: Diagnosis not present

## 2017-08-02 DIAGNOSIS — E669 Obesity, unspecified: Secondary | ICD-10-CM | POA: Diagnosis not present

## 2017-08-02 DIAGNOSIS — R69 Illness, unspecified: Secondary | ICD-10-CM | POA: Diagnosis not present

## 2017-08-02 DIAGNOSIS — Z9181 History of falling: Secondary | ICD-10-CM | POA: Diagnosis not present

## 2017-08-02 DIAGNOSIS — I10 Essential (primary) hypertension: Secondary | ICD-10-CM | POA: Diagnosis not present

## 2017-08-03 DIAGNOSIS — D649 Anemia, unspecified: Secondary | ICD-10-CM | POA: Diagnosis not present

## 2017-08-03 DIAGNOSIS — K5790 Diverticulosis of intestine, part unspecified, without perforation or abscess without bleeding: Secondary | ICD-10-CM | POA: Diagnosis not present

## 2017-08-03 DIAGNOSIS — R531 Weakness: Secondary | ICD-10-CM | POA: Diagnosis not present

## 2017-08-03 DIAGNOSIS — K581 Irritable bowel syndrome with constipation: Secondary | ICD-10-CM | POA: Diagnosis not present

## 2017-08-03 DIAGNOSIS — R69 Illness, unspecified: Secondary | ICD-10-CM | POA: Diagnosis not present

## 2017-08-03 DIAGNOSIS — I10 Essential (primary) hypertension: Secondary | ICD-10-CM | POA: Diagnosis not present

## 2017-08-03 DIAGNOSIS — Z9181 History of falling: Secondary | ICD-10-CM | POA: Diagnosis not present

## 2017-08-03 DIAGNOSIS — E669 Obesity, unspecified: Secondary | ICD-10-CM | POA: Diagnosis not present

## 2017-08-03 DIAGNOSIS — G2401 Drug induced subacute dyskinesia: Secondary | ICD-10-CM | POA: Diagnosis not present

## 2017-08-04 ENCOUNTER — Ambulatory Visit: Payer: Medicare HMO

## 2017-08-04 DIAGNOSIS — I10 Essential (primary) hypertension: Secondary | ICD-10-CM | POA: Diagnosis not present

## 2017-08-04 DIAGNOSIS — G2401 Drug induced subacute dyskinesia: Secondary | ICD-10-CM | POA: Diagnosis not present

## 2017-08-04 DIAGNOSIS — E669 Obesity, unspecified: Secondary | ICD-10-CM | POA: Diagnosis not present

## 2017-08-04 DIAGNOSIS — K5790 Diverticulosis of intestine, part unspecified, without perforation or abscess without bleeding: Secondary | ICD-10-CM | POA: Diagnosis not present

## 2017-08-04 DIAGNOSIS — D649 Anemia, unspecified: Secondary | ICD-10-CM | POA: Diagnosis not present

## 2017-08-04 DIAGNOSIS — Z9181 History of falling: Secondary | ICD-10-CM | POA: Diagnosis not present

## 2017-08-04 DIAGNOSIS — K581 Irritable bowel syndrome with constipation: Secondary | ICD-10-CM | POA: Diagnosis not present

## 2017-08-04 DIAGNOSIS — R531 Weakness: Secondary | ICD-10-CM | POA: Diagnosis not present

## 2017-08-04 DIAGNOSIS — R69 Illness, unspecified: Secondary | ICD-10-CM | POA: Diagnosis not present

## 2017-08-05 DIAGNOSIS — Z9181 History of falling: Secondary | ICD-10-CM | POA: Diagnosis not present

## 2017-08-05 DIAGNOSIS — K581 Irritable bowel syndrome with constipation: Secondary | ICD-10-CM | POA: Diagnosis not present

## 2017-08-05 DIAGNOSIS — R69 Illness, unspecified: Secondary | ICD-10-CM | POA: Diagnosis not present

## 2017-08-05 DIAGNOSIS — D649 Anemia, unspecified: Secondary | ICD-10-CM | POA: Diagnosis not present

## 2017-08-05 DIAGNOSIS — K5790 Diverticulosis of intestine, part unspecified, without perforation or abscess without bleeding: Secondary | ICD-10-CM | POA: Diagnosis not present

## 2017-08-05 DIAGNOSIS — I10 Essential (primary) hypertension: Secondary | ICD-10-CM | POA: Diagnosis not present

## 2017-08-05 DIAGNOSIS — R531 Weakness: Secondary | ICD-10-CM | POA: Diagnosis not present

## 2017-08-05 DIAGNOSIS — G2401 Drug induced subacute dyskinesia: Secondary | ICD-10-CM | POA: Diagnosis not present

## 2017-08-05 DIAGNOSIS — E669 Obesity, unspecified: Secondary | ICD-10-CM | POA: Diagnosis not present

## 2017-08-08 DIAGNOSIS — D649 Anemia, unspecified: Secondary | ICD-10-CM | POA: Diagnosis not present

## 2017-08-08 DIAGNOSIS — I10 Essential (primary) hypertension: Secondary | ICD-10-CM | POA: Diagnosis not present

## 2017-08-08 DIAGNOSIS — G2401 Drug induced subacute dyskinesia: Secondary | ICD-10-CM | POA: Diagnosis not present

## 2017-08-08 DIAGNOSIS — K581 Irritable bowel syndrome with constipation: Secondary | ICD-10-CM | POA: Diagnosis not present

## 2017-08-08 DIAGNOSIS — R531 Weakness: Secondary | ICD-10-CM | POA: Diagnosis not present

## 2017-08-08 DIAGNOSIS — R69 Illness, unspecified: Secondary | ICD-10-CM | POA: Diagnosis not present

## 2017-08-08 DIAGNOSIS — K5790 Diverticulosis of intestine, part unspecified, without perforation or abscess without bleeding: Secondary | ICD-10-CM | POA: Diagnosis not present

## 2017-08-08 DIAGNOSIS — Z9181 History of falling: Secondary | ICD-10-CM | POA: Diagnosis not present

## 2017-08-08 DIAGNOSIS — E669 Obesity, unspecified: Secondary | ICD-10-CM | POA: Diagnosis not present

## 2017-08-09 ENCOUNTER — Telehealth: Payer: Self-pay | Admitting: *Deleted

## 2017-08-09 NOTE — Telephone Encounter (Signed)
Patient Caregiver, [place] dropped off CAP Paperwork [phone] to remain at home and receive in home and community based services.  Printed Med List, Snap Shot and last OV and attached with paperwork and placed in Dr. Vale Haven folder to review and sign.  To call [name] once completed. R:[phone] or C: [phone]

## 2017-08-10 ENCOUNTER — Encounter: Payer: Self-pay | Admitting: Internal Medicine

## 2017-08-10 ENCOUNTER — Telehealth: Payer: Self-pay

## 2017-08-10 ENCOUNTER — Ambulatory Visit (INDEPENDENT_AMBULATORY_CARE_PROVIDER_SITE_OTHER): Payer: Medicare HMO | Admitting: Internal Medicine

## 2017-08-10 VITALS — BP 118/68 | HR 73 | Temp 99.2°F | Resp 16 | Ht 69.0 in | Wt 213.2 lb

## 2017-08-10 DIAGNOSIS — K5790 Diverticulosis of intestine, part unspecified, without perforation or abscess without bleeding: Secondary | ICD-10-CM | POA: Diagnosis not present

## 2017-08-10 DIAGNOSIS — R0602 Shortness of breath: Secondary | ICD-10-CM

## 2017-08-10 DIAGNOSIS — R5381 Other malaise: Secondary | ICD-10-CM

## 2017-08-10 DIAGNOSIS — Z23 Encounter for immunization: Secondary | ICD-10-CM | POA: Diagnosis not present

## 2017-08-10 DIAGNOSIS — M48061 Spinal stenosis, lumbar region without neurogenic claudication: Secondary | ICD-10-CM | POA: Diagnosis not present

## 2017-08-10 DIAGNOSIS — R531 Weakness: Secondary | ICD-10-CM | POA: Diagnosis not present

## 2017-08-10 DIAGNOSIS — G2401 Drug induced subacute dyskinesia: Secondary | ICD-10-CM

## 2017-08-10 DIAGNOSIS — R69 Illness, unspecified: Secondary | ICD-10-CM | POA: Diagnosis not present

## 2017-08-10 DIAGNOSIS — K581 Irritable bowel syndrome with constipation: Secondary | ICD-10-CM | POA: Diagnosis not present

## 2017-08-10 DIAGNOSIS — Z9181 History of falling: Secondary | ICD-10-CM | POA: Diagnosis not present

## 2017-08-10 DIAGNOSIS — I10 Essential (primary) hypertension: Secondary | ICD-10-CM | POA: Diagnosis not present

## 2017-08-10 DIAGNOSIS — R29898 Other symptoms and signs involving the musculoskeletal system: Secondary | ICD-10-CM

## 2017-08-10 DIAGNOSIS — D649 Anemia, unspecified: Secondary | ICD-10-CM | POA: Diagnosis not present

## 2017-08-10 DIAGNOSIS — E669 Obesity, unspecified: Secondary | ICD-10-CM | POA: Diagnosis not present

## 2017-08-10 NOTE — Telephone Encounter (Signed)
Paperwork in Dr. Vale Haven IN box for review and completion.

## 2017-08-10 NOTE — Progress Notes (Signed)
Patient ID: Judy [name], female   DOB: Jul 11, 1946, 71 y.o.   MRN: [id number]    Location:  [facility] Place of Service: OFFICE  Chief Complaint  Patient presents with  . Medical Management of Chronic Issues    HPI:  71 yo female seen today for f/u SOB, weakness, RLE pain and back pain. She fell last night after missing her walker. No injury. No head trauma. She has leg jerking. She saw psych and she was rx 2 new meds. Her breathing is unchanged and stable. Dr Melvyn Novas evaluated her for SOB and did not dx any new pulmonary issues. Breathing likely 2/2 deconditioning. Weight down 15 lbs. She is a poor historian due to psych d/o. Hx obtained from chart. Her son and grandson were initially present in the exam room but pt asked them to excuse themselves. Albumin 3.4  Back pain - chronic. She was seen in the ED 06/28/17 for leg pain. Family c/a cause of pain. Hip xray done 06/28/17 showed early degenerative changes. she then had MRI cervical, lumbar and brain. She had right L4 nerve root block and epidural by Dr Ellene Route on 06/02/17 for severe spinal stenosis. Injection ineffective and she was told she needs surgery.   Pulmonary nodules - noted on CTA chest. Followed by pulmonary Dr Melvyn Novas. She has chest tightness at rest and with ambulation. No wheezing. She was employed at [employer], Producer, television/film/video. She was not req'd to wear mask and she did not. She has never smoked cigs but has had second hand smoke exposure at home as a child. No hx child hood asthma. No cough or hemoptysis.  Depression/anxiety/bipolar - mood stable on wellbutrin XL, fluoxetine, latuda. seroquel stopped 05/15/17 in ED 2/2 c/a TD. Cogentin started. Followed by Johna Roles, PAC. A1c 5.6%  HTN - BP stable on amlodipine. She had a nuclear stress test in Jan 2018 that was abnormal. She f/u with cardio who thought abnormality was due to shifting breast attenuation. EF nml  Arthritis/chronic right knee pain - stable. She has occasional joint pain  controlled with advil OTC. She had burning and stinging in right anterior thigh not long ago that resolved with advil  Hyperlipidemia - diet controlled. LDL 117; HDL 61  GERD - pulmonary started on pepcid and changed pantoprazole to daily. She takes Linzess prn constipation  Hyperglycemia - A1c 5.7%  Past Medical History:  Diagnosis Date  . Allergic rhinitis   . Anemia   . Anxiety   . Arthritis   . Bipolar 1 disorder ([place])   . Depression   . Diverticulosis of colon (without mention of hemorrhage) 08/25/2011   [name]  . 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 Hyperlipidemia   . Hypertension   . Obesity   . Vertigo     Past Surgical History:  Procedure Laterality Date  . COLONOSCOPY  08/25/2011   Procedure: COLONOSCOPY;  Surgeon: Landry Dyke, MD;  Location: [facility];  Service: Endoscopy;  Laterality: N/A;  . DILATION AND CURETTAGE OF UTERUS  1960's    Patient Care Team: [name], DO as PCP - General (Internal Medicine) Kristeen Miss, MD as Consulting Physician (Neurosurgery) Cottle, Billey Co., MD as Attending Physician (Psychiatry)  Social History   Social History  . Marital status: Single    Spouse name: N/A  . Number of children: 1  . Years of education: 60   Occupational History  . Retired    Social History Main  Topics  . Smoking status: Never Smoker  . Smokeless tobacco: Never Used  . Alcohol use No  . Drug use: No  . Sexual activity: No   Other Topics Concern  . Not on file   Social History Narrative   Lives alone in house, does not need assist device.  Works part time at [employer]. Has a son.     Cousin and sister in close proximity   Right handed   2 cups coffee per day     reports that she has never smoked. She has never used smokeless tobacco. She reports that she does not drink alcohol or use drugs.  Family History  Problem Relation Age of Onset  . Prostate cancer Brother   . Hypertension Brother     . Kidney disease Brother   . Colon cancer Brother   . Hypertension Mother   . Kidney disease Mother   . Stomach cancer Father   . Hyperlipidemia Sister   . Hypothyroidism Sister   . Stroke Brother   . Prostate cancer Brother    Family Status  Relation Status  . Brother Alive  . Mother Deceased at age 80       kidney failure  . Father Deceased at age 32       stomach cancer  . Sister Alive  . Brother Deceased  . MGM Deceased  . MGF Deceased  . PGM Deceased  . PGF Deceased     No Known Allergies  Medications: Patient's Medications  New Prescriptions   No medications on file  Previous Medications   ACETAMINOPHEN (TYLENOL) 500 MG TABLET    Take 500 mg by mouth daily as needed for moderate pain.   AMLODIPINE (NORVASC) 5 MG TABLET    Take 1 tablet (5 mg total) by mouth daily.   BENZTROPINE (COGENTIN) 1 MG TABLET    Take 1 tablet (1 mg total) by mouth 2 (two) times daily.   BUPROPION (WELLBUTRIN XL) 150 MG 24 HR TABLET    Take 150 mg by mouth every morning. Take one tablet every morning   FAMOTIDINE (PEPCID) 20 MG TABLET    One at bedtime   FLUOXETINE HCL 60 MG TABS    Take 60 mg by mouth every morning.   LATUDA 120 MG TABS    Take 120 mg by mouth at bedtime. With 350 calories   LINACLOTIDE (LINZESS) 145 MCG CAPS CAPSULE    Take 145 mcg by mouth daily as needed (constipation).   MECLIZINE (ANTIVERT) 25 MG TABLET    Take 25 mg by mouth 3 (three) times daily as needed for dizziness.   MENTHOL, TOPICAL ANALGESIC, (BIOFREEZE) 4 % GEL    Apply 1 application topically 2 (two) times daily as needed (pain).   NAPROXEN SODIUM (ANAPROX) 220 MG TABLET    Take 440 mg by mouth daily as needed (pain).    PANTOPRAZOLE (PROTONIX) 40 MG TABLET    TAKE 1 TABLET BY MOUTH DAILY. TAKE 30-60 MIN BEFORE FIRST MEAL OF THE DAY  Modified Medications   No medications on file  Discontinued Medications   No medications on file    Review of Systems  Unable to perform ROS: Psychiatric disorder     Vitals:   08/10/17 1446  BP: 118/68  Pulse: 73  Resp: 16  Temp: 99.2 F (37.3 C)  TempSrc: Oral  SpO2: 97%  Weight: 213 lb 3.2 oz (96.7 kg)  Height: 5\' 9"  (1.753 m)   Body  mass index is 31.48 kg/m.  Physical Exam  Constitutional: She appears well-developed and well-nourished.  Cardiovascular: Regular rhythm.  Exam reveals no gallop and no friction rub.   Murmur (1/6 SEM) heard. No LE edema b/l. No calf TTP  Pulmonary/Chest: Effort normal and breath sounds normal. No respiratory distress. She has no wheezes. She has no rales. She exhibits no tenderness.  Neurological: She is alert. She displays tremor (resting). Gait abnormal.  Skin: Skin is warm and dry. No rash noted.  Psychiatric: She has a normal mood and affect. Her behavior is normal.     Labs reviewed: Admission on 06/28/2017, Discharged on 06/28/2017  Component Date Value Ref Range Status  . Sodium 06/28/2017 138  135 - 145 mmol/L Final  . Potassium 06/28/2017 3.2* 3.5 - 5.1 mmol/L Final  . Chloride 06/28/2017 105  101 - 111 mmol/L Final  . CO2 06/28/2017 23  22 - 32 mmol/L Final  . Glucose, Bld 06/28/2017 88  65 - 99 mg/dL Final  . BUN 06/28/2017 12  6 - 20 mg/dL Final  . Creatinine, Ser 06/28/2017 1.14* 0.44 - 1.00 mg/dL Final  . Calcium 06/28/2017 9.5  8.9 - 10.3 mg/dL Final  . GFR calc non Af Amer 06/28/2017 48* >60 mL/min Final  . GFR calc Af Amer 06/28/2017 56* >60 mL/min Final   Comment: (NOTE) The eGFR has been calculated using the CKD EPI equation. This calculation has not been validated in all clinical situations. eGFR's persistently <60 mL/min signify possible Chronic Kidney Disease.   . Anion gap 06/28/2017 10  5 - 15 Final  . WBC 06/28/2017 6.2  4.0 - 10.5 K/uL Final  . RBC 06/28/2017 4.13  3.87 - 5.11 MIL/uL Final  . Hemoglobin 06/28/2017 12.2  12.0 - 15.0 g/dL Final  . HCT 06/28/2017 36.1  36.0 - 46.0 % Final  . MCV 06/28/2017 87.4  78.0 - 100.0 fL Final  . MCH 06/28/2017 29.5  26.0 -  34.0 pg Final  . MCHC 06/28/2017 33.8  30.0 - 36.0 g/dL Final  . RDW 06/28/2017 15.8* 11.5 - 15.5 % Final  . Platelets 06/28/2017 292  150 - 400 K/uL Final  . Neutrophils Relative % 06/28/2017 52  % Final  . Neutro Abs 06/28/2017 3.3  1.7 - 7.7 K/uL Final  . Lymphocytes Relative 06/28/2017 37  % Final  . Lymphs Abs 06/28/2017 2.3  0.7 - 4.0 K/uL Final  . Monocytes Relative 06/28/2017 9  % Final  . Monocytes Absolute 06/28/2017 0.6  0.1 - 1.0 K/uL Final  . Eosinophils Relative 06/28/2017 2  % Final  . Eosinophils Absolute 06/28/2017 0.1  0.0 - 0.7 K/uL Final  . Basophils Relative 06/28/2017 0  % Final  . Basophils Absolute 06/28/2017 0.0  0.0 - 0.1 K/uL Final  Admission on 05/24/2017, Discharged on 05/27/2017  Component Date Value Ref Range Status  . Total CK 05/24/2017 431* 38 - 234 U/L Final  . Sed Rate 05/24/2017 20  0 - 22 mm/hr Final  . WBC 05/24/2017 9.3  4.0 - 10.5 K/uL Final  . RBC 05/24/2017 4.43  3.87 - 5.11 MIL/uL Final  . Hemoglobin 05/24/2017 13.2  12.0 - 15.0 g/dL Final  . HCT 05/24/2017 38.0  36.0 - 46.0 % Final  . MCV 05/24/2017 85.8  78.0 - 100.0 fL Final  . MCH 05/24/2017 29.8  26.0 - 34.0 pg Final  . MCHC 05/24/2017 34.7  30.0 - 36.0 g/dL Final  . RDW 05/24/2017 15.2  11.5 -  15.5 % Final  . Platelets 05/24/2017 325  150 - 400 K/uL Final  . Neutrophils Relative % 05/24/2017 60  % Final  . Neutro Abs 05/24/2017 5.5  1.7 - 7.7 K/uL Final  . Lymphocytes Relative 05/24/2017 28  % Final  . Lymphs Abs 05/24/2017 2.6  0.7 - 4.0 K/uL Final  . Monocytes Relative 05/24/2017 11  % Final  . Monocytes Absolute 05/24/2017 1.0  0.1 - 1.0 K/uL Final  . Eosinophils Relative 05/24/2017 1  % Final  . Eosinophils Absolute 05/24/2017 0.1  0.0 - 0.7 K/uL Final  . Basophils Relative 05/24/2017 0  % Final  . Basophils Absolute 05/24/2017 0.0  0.0 - 0.1 K/uL Final  . Sodium 05/24/2017 140  135 - 145 mmol/L Final  . Potassium 05/24/2017 3.2* 3.5 - 5.1 mmol/L Final  . Chloride 05/24/2017  103  101 - 111 mmol/L Final  . CO2 05/24/2017 27  22 - 32 mmol/L Final  . Glucose, Bld 05/24/2017 98  65 - 99 mg/dL Final  . BUN 05/24/2017 18  6 - 20 mg/dL Final  . Creatinine, Ser 05/24/2017 0.92  0.44 - 1.00 mg/dL Final  . Calcium 05/24/2017 9.6  8.9 - 10.3 mg/dL Final  . Total Protein 05/24/2017 7.5  6.5 - 8.1 g/dL Final  . Albumin 05/24/2017 4.0  3.5 - 5.0 g/dL Final  . AST 05/24/2017 30  15 - 41 U/L Final  . ALT 05/24/2017 28  14 - 54 U/L Final  . Alkaline Phosphatase 05/24/2017 74  38 - 126 U/L Final  . Total Bilirubin 05/24/2017 0.4  0.3 - 1.2 mg/dL Final  . GFR calc non Af Amer 05/24/2017 >60  >60 mL/min Final  . GFR calc Af Amer 05/24/2017 >60  >60 mL/min Final   Comment: (NOTE) The eGFR has been calculated using the CKD EPI equation. This calculation has not been validated in all clinical situations. eGFR's persistently <60 mL/min signify possible Chronic Kidney Disease.   . Anion gap 05/24/2017 10  5 - 15 Final  . B Natriuretic Peptide 05/24/2017 22.2  0.0 - 100.0 pg/mL Final  . Troponin i, poc 05/24/2017 0.00  0.00 - 0.08 ng/mL Final  . Comment 3 05/24/2017          Final   Comment: Due to the release kinetics of cTnI, a negative result within the first hours of the onset of symptoms does not rule out myocardial infarction with certainty. If myocardial infarction is still suspected, repeat the test at appropriate intervals.   [name] TSH 05/24/2017 1.450  0.350 - 4.500 uIU/mL Final   Performed by a 3rd Generation assay with a functional sensitivity of <=0.01 uIU/mL.  [name] C-ANCA 05/25/2017 <1:20  Neg:<1:20 titer Final  . P-ANCA 05/25/2017 <1:20  Neg:<1:20 titer Final   Comment: (NOTE) The presence of positive fluorescence exhibiting P-ANCA or C-ANCA patterns alone is not specific for the diagnosis of Wegener's Granulomatosis (WG) or microscopic polyangiitis. Decisions about treatment should not be based solely on ANCA IFA results.  The International ANCA Group Consensus  recommends follow up testing of positive sera with both PR-3 and MPO-ANCA enzyme immunoassays. As many as 5% serum samples are positive only by EIA. Ref. AM J Clin Pathol 1999;111:507-513.   [name] Atypical P-ANCA titer 05/25/2017 <1:20  Neg:<1:20 titer Final   Comment: (NOTE) The atypical pANCA pattern has been observed in a significant percentage of patients with ulcerative colitis, primary sclerosing cholangitis and autoimmune hepatitis. Performed At: [facility] [address]  [address] [place], [place] [id number] Lindon Romp MD [phone]:[id number]   . Myeloperoxidase Abs 05/25/2017 <9.0  0.0 - 9.0 U/mL Final  . ANCA Proteinase 3 05/25/2017 <3.5  0.0 - 3.5 U/mL Corrected   Comment: (NOTE) Performed At: [facility] [place], [place] [id number] Lindon Romp MD OE:[id number]   . Angiotensin-Converting Enzyme 05/25/2017 17  14 - 82 U/L Final   Comment: (NOTE) Performed At: [facility] [place], [place] [id number] Lindon Romp MD CV:[id number]   . Anit Nuclear Antibody(ANA) 05/25/2017 Negative  Negative Final   Comment: (NOTE) Performed At: [facility] [place], [place] [id number] Lindon Romp MD [id number]   . Rhuematoid fact SerPl-aCnc 05/25/2017 <10.0  0.0 - 13.9 IU/mL Final   Comment: (NOTE) Performed At: [facility] [place], [place] [id number] Lindon Romp MD PJ:[id number]   . SSA (Ro) (ENA) Antibody, IgG 05/25/2017 <0.2  0.0 - 0.9 AI Final   Comment: (NOTE) Performed At: [facility] [place], [place] [id number] Lindon Romp MD [id number]   . SSB (La) (ENA) Antibody, IgG 05/25/2017 <0.2  0.0 - 0.9 AI Final   Comment: (NOTE) Performed At: [facility] [place], [place] [id number] Lindon Romp MD DZ:[id number]   . WBC 05/26/2017 7.2  4.0 - 10.5 K/uL Final  . RBC 05/26/2017 4.39  3.87 - 5.11 MIL/uL Final    . Hemoglobin 05/26/2017 12.7  12.0 - 15.0 g/dL Final  . HCT 05/26/2017 38.0  36.0 - 46.0 % Final  . MCV 05/26/2017 86.6  78.0 - 100.0 fL Final  . MCH 05/26/2017 28.9  26.0 - 34.0 pg Final  . MCHC 05/26/2017 33.4  30.0 - 36.0 g/dL Final  . RDW 05/26/2017 15.4  11.5 - 15.5 % Final  . Platelets 05/26/2017 326  150 - 400 K/uL Final  . Neutrophils Relative % 05/26/2017 53  % Final  . Neutro Abs 05/26/2017 3.8  1.7 - 7.7 K/uL Final  . Lymphocytes Relative 05/26/2017 35  % Final  . Lymphs Abs 05/26/2017 2.6  0.7 - 4.0 K/uL Final  . Monocytes Relative 05/26/2017 9  % Final  . Monocytes Absolute 05/26/2017 0.7  0.1 - 1.0 K/uL Final  . Eosinophils Relative 05/26/2017 3  % Final  . Eosinophils Absolute 05/26/2017 0.2  0.0 - 0.7 K/uL Final  . Basophils Relative 05/26/2017 0  % Final  . Basophils Absolute 05/26/2017 0.0  0.0 - 0.1 K/uL Final  . Sodium 05/26/2017 140  135 - 145 mmol/L Final  . Potassium 05/26/2017 3.5  3.5 - 5.1 mmol/L Final  . Chloride 05/26/2017 105  101 - 111 mmol/L Final  . CO2 05/26/2017 22  22 - 32 mmol/L Final  . Glucose, Bld 05/26/2017 120* 65 - 99 mg/dL Final  . BUN 05/26/2017 8  6 - 20 mg/dL Final  . Creatinine, Ser 05/26/2017 0.84  0.44 - 1.00 mg/dL Final  . Calcium 05/26/2017 9.1  8.9 - 10.3 mg/dL Final  . Total Protein 05/26/2017 6.7  6.5 - 8.1 g/dL Final  . Albumin 05/26/2017 3.5  3.5 - 5.0 g/dL Final  . AST 05/26/2017 32  15 - 41 U/L Final  . ALT 05/26/2017 30  14 - 54 U/L Final  . Alkaline Phosphatase 05/26/2017 73  38 - 126 U/L Final  . Total Bilirubin 05/26/2017 0.6  0.3 - 1.2 mg/dL Final  . GFR calc non Af Amer 05/26/2017 >60  >  60 mL/min Final  . GFR calc Af Amer 05/26/2017 >60  >60 mL/min Final   Comment: (NOTE) The eGFR has been calculated using the CKD EPI equation. This calculation has not been validated in all clinical situations. eGFR's persistently <60 mL/min signify possible Chronic Kidney Disease.   . Anion gap 05/26/2017 13  5 - 15 Final  .  Magnesium 05/26/2017 2.0  1.7 - 2.4 mg/dL Final  . Phosphorus 05/26/2017 3.1  2.5 - 4.6 mg/dL Final  . WBC 05/27/2017 7.8  4.0 - 10.5 K/uL Final  . RBC 05/27/2017 4.31  3.87 - 5.11 MIL/uL Final  . Hemoglobin 05/27/2017 12.4  12.0 - 15.0 g/dL Final  . HCT 05/27/2017 37.3  36.0 - 46.0 % Final  . MCV 05/27/2017 86.5  78.0 - 100.0 fL Final  . MCH 05/27/2017 28.8  26.0 - 34.0 pg Final  . MCHC 05/27/2017 33.2  30.0 - 36.0 g/dL Final  . RDW 05/27/2017 15.4  11.5 - 15.5 % Final  . Platelets 05/27/2017 328  150 - 400 K/uL Final  . Neutrophils Relative % 05/27/2017 53  % Final  . Neutro Abs 05/27/2017 4.1  1.7 - 7.7 K/uL Final  . Lymphocytes Relative 05/27/2017 34  % Final  . Lymphs Abs 05/27/2017 2.6  0.7 - 4.0 K/uL Final  . Monocytes Relative 05/27/2017 10  % Final  . Monocytes Absolute 05/27/2017 0.8  0.1 - 1.0 K/uL Final  . Eosinophils Relative 05/27/2017 3  % Final  . Eosinophils Absolute 05/27/2017 0.2  0.0 - 0.7 K/uL Final  . Basophils Relative 05/27/2017 0  % Final  . Basophils Absolute 05/27/2017 0.0  0.0 - 0.1 K/uL Final  . Sodium 05/27/2017 139  135 - 145 mmol/L Final  . Potassium 05/27/2017 3.3* 3.5 - 5.1 mmol/L Final  . Chloride 05/27/2017 103  101 - 111 mmol/L Final  . CO2 05/27/2017 28  22 - 32 mmol/L Final  . Glucose, Bld 05/27/2017 103* 65 - 99 mg/dL Final  . BUN 05/27/2017 9  6 - 20 mg/dL Final  . Creatinine, Ser 05/27/2017 0.83  0.44 - 1.00 mg/dL Final  . Calcium 05/27/2017 9.2  8.9 - 10.3 mg/dL Final  . Total Protein 05/27/2017 6.7  6.5 - 8.1 g/dL Final  . Albumin 05/27/2017 3.4* 3.5 - 5.0 g/dL Final  . AST 05/27/2017 27  15 - 41 U/L Final  . ALT 05/27/2017 29  14 - 54 U/L Final  . Alkaline Phosphatase 05/27/2017 71  38 - 126 U/L Final  . Total Bilirubin 05/27/2017 0.5  0.3 - 1.2 mg/dL Final  . GFR calc non Af Amer 05/27/2017 >60  >60 mL/min Final  . GFR calc Af Amer 05/27/2017 >60  >60 mL/min Final   Comment: (NOTE) The eGFR has been calculated using the CKD EPI  equation. This calculation has not been validated in all clinical situations. eGFR's persistently <60 mL/min signify possible Chronic Kidney Disease.   . Anion gap 05/27/2017 8  5 - 15 Final  . Magnesium 05/27/2017 2.1  1.7 - 2.4 mg/dL Final  . Phosphorus 05/27/2017 3.3  2.5 - 4.6 mg/dL Final  Admission on 05/15/2017, Discharged on 05/16/2017  Component Date Value Ref Range Status  . WBC 05/15/2017 8.6  4.0 - 10.5 K/uL Final  . RBC 05/15/2017 4.25  3.87 - 5.11 MIL/uL Final  . Hemoglobin 05/15/2017 12.6  12.0 - 15.0 g/dL Final  . HCT 05/15/2017 36.8  36.0 - 46.0 % Final  . MCV 05/15/2017 86.6  78.0 - 100.0 fL Final  . MCH 05/15/2017 29.6  26.0 - 34.0 pg Final  . MCHC 05/15/2017 34.2  30.0 - 36.0 g/dL Final  . RDW 05/15/2017 15.3  11.5 - 15.5 % Final  . Platelets 05/15/2017 260  150 - 400 K/uL Final  . Neutrophils Relative % 05/15/2017 57  % Final  . Neutro Abs 05/15/2017 4.9  1.7 - 7.7 K/uL Final  . Lymphocytes Relative 05/15/2017 31  % Final  . Lymphs Abs 05/15/2017 2.7  0.7 - 4.0 K/uL Final  . Monocytes Relative 05/15/2017 9  % Final  . Monocytes Absolute 05/15/2017 0.8  0.1 - 1.0 K/uL Final  . Eosinophils Relative 05/15/2017 3  % Final  . Eosinophils Absolute 05/15/2017 0.2  0.0 - 0.7 K/uL Final  . Basophils Relative 05/15/2017 0  % Final  . Basophils Absolute 05/15/2017 0.0  0.0 - 0.1 K/uL Final  . Sodium 05/15/2017 143  135 - 145 mmol/L Final  . Potassium 05/15/2017 3.5  3.5 - 5.1 mmol/L Final  . Chloride 05/15/2017 108  101 - 111 mmol/L Final  . CO2 05/15/2017 26  22 - 32 mmol/L Final  . Glucose, Bld 05/15/2017 105* 65 - 99 mg/dL Final  . BUN 05/15/2017 14  6 - 20 mg/dL Final  . Creatinine, Ser 05/15/2017 0.96  0.44 - 1.00 mg/dL Final  . Calcium 05/15/2017 9.4  8.9 - 10.3 mg/dL Final  . Total Protein 05/15/2017 7.0  6.5 - 8.1 g/dL Final  . Albumin 05/15/2017 3.9  3.5 - 5.0 g/dL Final  . AST 05/15/2017 44* 15 - 41 U/L Final  . ALT 05/15/2017 31  14 - 54 U/L Final  .  Alkaline Phosphatase 05/15/2017 73  38 - 126 U/L Final  . Total Bilirubin 05/15/2017 0.4  0.3 - 1.2 mg/dL Final  . GFR calc non Af Amer 05/15/2017 59* >60 mL/min Final  . GFR calc Af Amer 05/15/2017 >60  >60 mL/min Final   Comment: (NOTE) The eGFR has been calculated using the CKD EPI equation. This calculation has not been validated in all clinical situations. eGFR's persistently <60 mL/min signify possible Chronic Kidney Disease.   . Anion gap 05/15/2017 9  5 - 15 Final  . Troponin i, poc 05/15/2017 0.00  0.00 - 0.08 ng/mL Final  . Comment 3 05/15/2017          Final   Comment: Due to the release kinetics of cTnI, a negative result within the first hours of the onset of symptoms does not rule out myocardial infarction with certainty. If myocardial infarction is still suspected, repeat the test at appropriate intervals.   . B Natriuretic Peptide 05/15/2017 38.7  0.0 - 100.0 pg/mL Final  . TSH 05/15/2017 0.659  0.350 - 4.500 uIU/mL Final   Performed by a 3rd Generation assay with a functional sensitivity of <=0.01 uIU/mL.  [name] FIO2 05/15/2017 21.00   Final  . pH, Arterial 05/15/2017 7.506* 7.350 - 7.450 Final  . pCO2 arterial 05/15/2017 31.9* 32.0 - 48.0 mmHg Final  . pO2, Arterial 05/15/2017 98.0  83.0 - 108.0 mmHg Final  . Bicarbonate 05/15/2017 25.0  20.0 - 28.0 mmol/L Final  . Acid-Base Excess 05/15/2017 2.8* 0.0 - 2.0 mmol/L Final  . O2 Saturation 05/15/2017 97.7  % Final  . Patient temperature 05/15/2017 98.8   Final  . Collection site 05/15/2017 LEFT RADIAL   Final  . Drawn by 05/15/2017 [id number]   Final  . Sample type 05/15/2017 ARTERIAL DRAW  Final  . Allens test (pass/fail) 05/15/2017 PASS  PASS Final    No results found.   Assessment/Plan   ICD-10-CM   1. Physical deconditioning R53.81   2. Shortness of breath R06.02   3. Tardive dyskinesia G24.01   4. Spinal stenosis at L4-L5 level M48.061   5. Weakness of right lower extremity R29.898   6. Need for immunization  against influenza Z23 Flu Vaccine QUAD 6+ mos PF IM (Fluarix Quad PF)   You are improving - continue in home physical therapy as ordered and will reassess your condition when therapy is completed  Continue current medications as ordered  Follow up with specialists as scheduled  CALL OFFICE WITH NAMES OF NEW MOOD MEDICATIONS  Flu shot given today  Follow up in 2 mos for back pain, deconditioning, TD and HTN   Vinisha Faxon S. Perlie Gold  [facility] [address] [place], [place] [address] [phone] Cell (Monday-Friday 8 AM - 5 PM) [phone] After 5 PM and follow prompts

## 2017-08-10 NOTE — Telephone Encounter (Signed)
Harriett (patient's sister) called to inquire about today's visit. I read Harriett Dr.Carter's instructions on AVS.   Harriett states patient has a problem with comprehension and when she gets home she is mostly confused about her visit and what she is supposed to do.  Patient was here today with son and grandson. Last MMSE was 27/30    Patient is a high fall risk, 3 falls within the last 1.5 months, last fall was last night.  Patient is currently enrolled in PT per AVS  Harriett hoped to speak directly with Dr.Carter to address these concerns. Harriett is aware I will forward this information to Dr.Carter.  Harriett then inquired about [facility] paperwork dropped off 2 days ago to be reviewed and returned.   Please advise or follow-up with Harriett

## 2017-08-10 NOTE — Patient Instructions (Addendum)
You are improving - continue in home physical therapy as ordered and will reassess your condition when therapy is completed  Continue current medications as ordered  Follow up with specialists as scheduled  [place]  Flu shot given today  Follow up in 2 mos for back pain, deconditioning, TD and HTN

## 2017-08-11 DIAGNOSIS — D649 Anemia, unspecified: Secondary | ICD-10-CM | POA: Diagnosis not present

## 2017-08-11 DIAGNOSIS — R69 Illness, unspecified: Secondary | ICD-10-CM | POA: Diagnosis not present

## 2017-08-11 DIAGNOSIS — Z9181 History of falling: Secondary | ICD-10-CM | POA: Diagnosis not present

## 2017-08-11 DIAGNOSIS — G2401 Drug induced subacute dyskinesia: Secondary | ICD-10-CM | POA: Diagnosis not present

## 2017-08-11 DIAGNOSIS — K5790 Diverticulosis of intestine, part unspecified, without perforation or abscess without bleeding: Secondary | ICD-10-CM | POA: Diagnosis not present

## 2017-08-11 DIAGNOSIS — K581 Irritable bowel syndrome with constipation: Secondary | ICD-10-CM | POA: Diagnosis not present

## 2017-08-11 DIAGNOSIS — I10 Essential (primary) hypertension: Secondary | ICD-10-CM | POA: Diagnosis not present

## 2017-08-11 DIAGNOSIS — E669 Obesity, unspecified: Secondary | ICD-10-CM | POA: Diagnosis not present

## 2017-08-11 DIAGNOSIS — R531 Weakness: Secondary | ICD-10-CM | POA: Diagnosis not present

## 2017-08-15 DIAGNOSIS — K581 Irritable bowel syndrome with constipation: Secondary | ICD-10-CM | POA: Diagnosis not present

## 2017-08-15 DIAGNOSIS — K5790 Diverticulosis of intestine, part unspecified, without perforation or abscess without bleeding: Secondary | ICD-10-CM | POA: Diagnosis not present

## 2017-08-15 DIAGNOSIS — E669 Obesity, unspecified: Secondary | ICD-10-CM | POA: Diagnosis not present

## 2017-08-15 DIAGNOSIS — D649 Anemia, unspecified: Secondary | ICD-10-CM | POA: Diagnosis not present

## 2017-08-15 DIAGNOSIS — I10 Essential (primary) hypertension: Secondary | ICD-10-CM | POA: Diagnosis not present

## 2017-08-15 DIAGNOSIS — G2401 Drug induced subacute dyskinesia: Secondary | ICD-10-CM | POA: Diagnosis not present

## 2017-08-15 DIAGNOSIS — R531 Weakness: Secondary | ICD-10-CM | POA: Diagnosis not present

## 2017-08-15 DIAGNOSIS — R69 Illness, unspecified: Secondary | ICD-10-CM | POA: Diagnosis not present

## 2017-08-15 DIAGNOSIS — Z9181 History of falling: Secondary | ICD-10-CM | POA: Diagnosis not present

## 2017-08-15 NOTE — Telephone Encounter (Signed)
Paperwork completed and left message informing patient/sister paperwork is complete and ready for pick-up.  Copy sent for scanning.   * If patient would prefer for paperwork to be mailed, I instructed them to call and advise

## 2017-08-16 DIAGNOSIS — R69 Illness, unspecified: Secondary | ICD-10-CM | POA: Diagnosis not present

## 2017-08-17 DIAGNOSIS — E669 Obesity, unspecified: Secondary | ICD-10-CM | POA: Diagnosis not present

## 2017-08-17 DIAGNOSIS — Z9181 History of falling: Secondary | ICD-10-CM | POA: Diagnosis not present

## 2017-08-17 DIAGNOSIS — R69 Illness, unspecified: Secondary | ICD-10-CM | POA: Diagnosis not present

## 2017-08-17 DIAGNOSIS — G2401 Drug induced subacute dyskinesia: Secondary | ICD-10-CM | POA: Diagnosis not present

## 2017-08-17 DIAGNOSIS — K581 Irritable bowel syndrome with constipation: Secondary | ICD-10-CM | POA: Diagnosis not present

## 2017-08-17 DIAGNOSIS — I10 Essential (primary) hypertension: Secondary | ICD-10-CM | POA: Diagnosis not present

## 2017-08-17 DIAGNOSIS — K5790 Diverticulosis of intestine, part unspecified, without perforation or abscess without bleeding: Secondary | ICD-10-CM | POA: Diagnosis not present

## 2017-08-17 DIAGNOSIS — D649 Anemia, unspecified: Secondary | ICD-10-CM | POA: Diagnosis not present

## 2017-08-17 DIAGNOSIS — R531 Weakness: Secondary | ICD-10-CM | POA: Diagnosis not present

## 2017-08-18 DIAGNOSIS — R531 Weakness: Secondary | ICD-10-CM | POA: Diagnosis not present

## 2017-08-18 DIAGNOSIS — Z9181 History of falling: Secondary | ICD-10-CM | POA: Diagnosis not present

## 2017-08-18 DIAGNOSIS — I10 Essential (primary) hypertension: Secondary | ICD-10-CM | POA: Diagnosis not present

## 2017-08-18 DIAGNOSIS — E669 Obesity, unspecified: Secondary | ICD-10-CM | POA: Diagnosis not present

## 2017-08-18 DIAGNOSIS — G2401 Drug induced subacute dyskinesia: Secondary | ICD-10-CM | POA: Diagnosis not present

## 2017-08-18 DIAGNOSIS — K5790 Diverticulosis of intestine, part unspecified, without perforation or abscess without bleeding: Secondary | ICD-10-CM | POA: Diagnosis not present

## 2017-08-18 DIAGNOSIS — K581 Irritable bowel syndrome with constipation: Secondary | ICD-10-CM | POA: Diagnosis not present

## 2017-08-18 DIAGNOSIS — R69 Illness, unspecified: Secondary | ICD-10-CM | POA: Diagnosis not present

## 2017-08-18 DIAGNOSIS — D649 Anemia, unspecified: Secondary | ICD-10-CM | POA: Diagnosis not present

## 2017-08-19 DIAGNOSIS — E669 Obesity, unspecified: Secondary | ICD-10-CM | POA: Diagnosis not present

## 2017-08-19 DIAGNOSIS — D649 Anemia, unspecified: Secondary | ICD-10-CM | POA: Diagnosis not present

## 2017-08-19 DIAGNOSIS — Z9181 History of falling: Secondary | ICD-10-CM | POA: Diagnosis not present

## 2017-08-19 DIAGNOSIS — R531 Weakness: Secondary | ICD-10-CM | POA: Diagnosis not present

## 2017-08-19 DIAGNOSIS — I10 Essential (primary) hypertension: Secondary | ICD-10-CM | POA: Diagnosis not present

## 2017-08-19 DIAGNOSIS — K581 Irritable bowel syndrome with constipation: Secondary | ICD-10-CM | POA: Diagnosis not present

## 2017-08-19 DIAGNOSIS — R69 Illness, unspecified: Secondary | ICD-10-CM | POA: Diagnosis not present

## 2017-08-19 DIAGNOSIS — G2401 Drug induced subacute dyskinesia: Secondary | ICD-10-CM | POA: Diagnosis not present

## 2017-08-19 DIAGNOSIS — K5790 Diverticulosis of intestine, part unspecified, without perforation or abscess without bleeding: Secondary | ICD-10-CM | POA: Diagnosis not present

## 2017-08-22 DIAGNOSIS — K581 Irritable bowel syndrome with constipation: Secondary | ICD-10-CM | POA: Diagnosis not present

## 2017-08-22 DIAGNOSIS — R69 Illness, unspecified: Secondary | ICD-10-CM | POA: Diagnosis not present

## 2017-08-22 DIAGNOSIS — I10 Essential (primary) hypertension: Secondary | ICD-10-CM | POA: Diagnosis not present

## 2017-08-22 DIAGNOSIS — R531 Weakness: Secondary | ICD-10-CM | POA: Diagnosis not present

## 2017-08-22 DIAGNOSIS — K5790 Diverticulosis of intestine, part unspecified, without perforation or abscess without bleeding: Secondary | ICD-10-CM | POA: Diagnosis not present

## 2017-08-22 DIAGNOSIS — G2401 Drug induced subacute dyskinesia: Secondary | ICD-10-CM | POA: Diagnosis not present

## 2017-08-22 DIAGNOSIS — Z9181 History of falling: Secondary | ICD-10-CM | POA: Diagnosis not present

## 2017-08-22 DIAGNOSIS — D649 Anemia, unspecified: Secondary | ICD-10-CM | POA: Diagnosis not present

## 2017-08-22 DIAGNOSIS — E669 Obesity, unspecified: Secondary | ICD-10-CM | POA: Diagnosis not present

## 2017-08-24 ENCOUNTER — Telehealth: Payer: Self-pay | Admitting: *Deleted

## 2017-08-24 DIAGNOSIS — Z9181 History of falling: Secondary | ICD-10-CM | POA: Diagnosis not present

## 2017-08-24 DIAGNOSIS — K5790 Diverticulosis of intestine, part unspecified, without perforation or abscess without bleeding: Secondary | ICD-10-CM | POA: Diagnosis not present

## 2017-08-24 DIAGNOSIS — D649 Anemia, unspecified: Secondary | ICD-10-CM | POA: Diagnosis not present

## 2017-08-24 DIAGNOSIS — K581 Irritable bowel syndrome with constipation: Secondary | ICD-10-CM | POA: Diagnosis not present

## 2017-08-24 DIAGNOSIS — I10 Essential (primary) hypertension: Secondary | ICD-10-CM | POA: Diagnosis not present

## 2017-08-24 DIAGNOSIS — E669 Obesity, unspecified: Secondary | ICD-10-CM | POA: Diagnosis not present

## 2017-08-24 DIAGNOSIS — R531 Weakness: Secondary | ICD-10-CM | POA: Diagnosis not present

## 2017-08-24 DIAGNOSIS — R69 Illness, unspecified: Secondary | ICD-10-CM | POA: Diagnosis not present

## 2017-08-24 DIAGNOSIS — G2401 Drug induced subacute dyskinesia: Secondary | ICD-10-CM | POA: Diagnosis not present

## 2017-08-24 NOTE — Telephone Encounter (Signed)
Judy [name] with [facility] called wanting a verbal order for Social Worker to evaluate and treat. Verbal order given.

## 2017-08-25 ENCOUNTER — Ambulatory Visit: Payer: Medicare HMO

## 2017-08-25 DIAGNOSIS — G2401 Drug induced subacute dyskinesia: Secondary | ICD-10-CM | POA: Diagnosis not present

## 2017-08-25 DIAGNOSIS — R69 Illness, unspecified: Secondary | ICD-10-CM | POA: Diagnosis not present

## 2017-08-25 DIAGNOSIS — D649 Anemia, unspecified: Secondary | ICD-10-CM | POA: Diagnosis not present

## 2017-08-25 DIAGNOSIS — I10 Essential (primary) hypertension: Secondary | ICD-10-CM | POA: Diagnosis not present

## 2017-08-25 DIAGNOSIS — E669 Obesity, unspecified: Secondary | ICD-10-CM | POA: Diagnosis not present

## 2017-08-25 DIAGNOSIS — Z9181 History of falling: Secondary | ICD-10-CM | POA: Diagnosis not present

## 2017-08-25 DIAGNOSIS — K5790 Diverticulosis of intestine, part unspecified, without perforation or abscess without bleeding: Secondary | ICD-10-CM | POA: Diagnosis not present

## 2017-08-25 DIAGNOSIS — R531 Weakness: Secondary | ICD-10-CM | POA: Diagnosis not present

## 2017-08-25 DIAGNOSIS — K581 Irritable bowel syndrome with constipation: Secondary | ICD-10-CM | POA: Diagnosis not present

## 2017-08-27 DIAGNOSIS — K5790 Diverticulosis of intestine, part unspecified, without perforation or abscess without bleeding: Secondary | ICD-10-CM | POA: Diagnosis not present

## 2017-08-27 DIAGNOSIS — D649 Anemia, unspecified: Secondary | ICD-10-CM | POA: Diagnosis not present

## 2017-08-27 DIAGNOSIS — R69 Illness, unspecified: Secondary | ICD-10-CM | POA: Diagnosis not present

## 2017-08-27 DIAGNOSIS — G2401 Drug induced subacute dyskinesia: Secondary | ICD-10-CM | POA: Diagnosis not present

## 2017-08-27 DIAGNOSIS — K581 Irritable bowel syndrome with constipation: Secondary | ICD-10-CM | POA: Diagnosis not present

## 2017-08-27 DIAGNOSIS — I10 Essential (primary) hypertension: Secondary | ICD-10-CM | POA: Diagnosis not present

## 2017-08-27 DIAGNOSIS — R531 Weakness: Secondary | ICD-10-CM | POA: Diagnosis not present

## 2017-08-27 DIAGNOSIS — E669 Obesity, unspecified: Secondary | ICD-10-CM | POA: Diagnosis not present

## 2017-08-27 DIAGNOSIS — Z9181 History of falling: Secondary | ICD-10-CM | POA: Diagnosis not present

## 2017-09-06 ENCOUNTER — Other Ambulatory Visit: Payer: Self-pay | Admitting: *Deleted

## 2017-09-06 MED ORDER — MECLIZINE HCL 25 MG PO TABS: 25.0000 mg | ORAL_TABLET | Freq: Three times a day (TID) | ORAL | 1 refills | Status: DC | PRN

## 2017-09-06 NOTE — Telephone Encounter (Signed)
[phone]-transferred from previous pharmacy

## 2017-09-13 ENCOUNTER — Ambulatory Visit: Payer: Medicare HMO | Admitting: Internal Medicine

## 2017-09-23 ENCOUNTER — Inpatient Hospital Stay: Admission: RE | Admit: 2017-09-23 | Payer: Medicare HMO | Source: Ambulatory Visit

## 2017-09-28 ENCOUNTER — Other Ambulatory Visit: Payer: Self-pay | Admitting: Internal Medicine

## 2017-09-28 ENCOUNTER — Other Ambulatory Visit: Payer: Self-pay | Admitting: Nurse Practitioner

## 2017-10-06 ENCOUNTER — Other Ambulatory Visit: Payer: Self-pay | Admitting: Internal Medicine

## 2017-10-20 DIAGNOSIS — N390 Urinary tract infection, site not specified: Secondary | ICD-10-CM | POA: Diagnosis not present

## 2017-10-20 DIAGNOSIS — R899 Unspecified abnormal finding in specimens from other organs, systems and tissues: Secondary | ICD-10-CM | POA: Diagnosis not present

## 2017-10-20 DIAGNOSIS — N183 Chronic kidney disease, stage 3 (moderate): Secondary | ICD-10-CM | POA: Diagnosis not present

## 2017-10-20 DIAGNOSIS — N179 Acute kidney failure, unspecified: Secondary | ICD-10-CM | POA: Diagnosis not present

## 2017-10-31 ENCOUNTER — Ambulatory Visit: Payer: Medicare HMO | Admitting: Neurology

## 2017-10-31 ENCOUNTER — Telehealth: Payer: Self-pay | Admitting: Neurology

## 2017-10-31 NOTE — Telephone Encounter (Signed)
This patient did not show for a revisit appointment today. 

## 2017-11-08 ENCOUNTER — Ambulatory Visit
Admission: RE | Admit: 2017-11-08 | Discharge: 2017-11-08 | Disposition: A | Discharge status: Home / Self Care | Payer: Medicare HMO | Source: Ambulatory Visit | Attending: Internal Medicine | Admitting: Internal Medicine

## 2017-11-08 DIAGNOSIS — Z1231 Encounter for screening mammogram for malignant neoplasm of breast: Secondary | ICD-10-CM | POA: Diagnosis not present

## 2017-11-23 DIAGNOSIS — M1712 Unilateral primary osteoarthritis, left knee: Secondary | ICD-10-CM | POA: Diagnosis not present

## 2017-11-23 DIAGNOSIS — M5136 Other intervertebral disc degeneration, lumbar region: Secondary | ICD-10-CM | POA: Diagnosis not present

## 2017-11-23 DIAGNOSIS — M1711 Unilateral primary osteoarthritis, right knee: Secondary | ICD-10-CM | POA: Diagnosis not present

## 2017-11-25 ENCOUNTER — Telehealth: Payer: Self-pay

## 2017-11-25 NOTE — Telephone Encounter (Signed)
Called patient to confirm equipment and supplies requested from [facility].   Patient did request:  Off the shelf Orthotics-  1.)Lumbar Sacral Orthosis 2.)Ovation knee orthosis single 3.)Addition to Lower extremity Orthosis  Form placed in Dr.Carter's review and sign folder

## 2017-11-28 DIAGNOSIS — M545 Low back pain: Secondary | ICD-10-CM | POA: Diagnosis not present

## 2017-11-28 DIAGNOSIS — M1711 Unilateral primary osteoarthritis, right knee: Secondary | ICD-10-CM | POA: Diagnosis not present

## 2017-11-28 DIAGNOSIS — M5136 Other intervertebral disc degeneration, lumbar region: Secondary | ICD-10-CM | POA: Diagnosis not present

## 2017-12-01 DIAGNOSIS — N390 Urinary tract infection, site not specified: Secondary | ICD-10-CM | POA: Diagnosis not present

## 2017-12-01 DIAGNOSIS — R32 Unspecified urinary incontinence: Secondary | ICD-10-CM | POA: Diagnosis not present

## 2017-12-01 DIAGNOSIS — R634 Abnormal weight loss: Secondary | ICD-10-CM | POA: Diagnosis not present

## 2017-12-01 DIAGNOSIS — R899 Unspecified abnormal finding in specimens from other organs, systems and tissues: Secondary | ICD-10-CM | POA: Diagnosis not present

## 2017-12-01 DIAGNOSIS — N179 Acute kidney failure, unspecified: Secondary | ICD-10-CM | POA: Diagnosis not present

## 2017-12-14 DIAGNOSIS — M545 Low back pain: Secondary | ICD-10-CM | POA: Diagnosis not present

## 2017-12-14 DIAGNOSIS — M1711 Unilateral primary osteoarthritis, right knee: Secondary | ICD-10-CM | POA: Diagnosis not present

## 2018-01-02 DIAGNOSIS — R69 Illness, unspecified: Secondary | ICD-10-CM | POA: Diagnosis not present

## 2018-02-28 ENCOUNTER — Other Ambulatory Visit: Payer: Self-pay | Admitting: Internal Medicine

## 2018-03-03 ENCOUNTER — Other Ambulatory Visit: Payer: Self-pay | Admitting: Internal Medicine

## 2018-03-21 ENCOUNTER — Encounter: Payer: Self-pay | Admitting: Internal Medicine

## 2018-03-21 ENCOUNTER — Ambulatory Visit
Admission: RE | Admit: 2018-03-21 | Discharge: 2018-03-21 | Disposition: A | Discharge status: Home / Self Care | Payer: Medicare HMO | Source: Ambulatory Visit | Attending: Internal Medicine | Admitting: Internal Medicine

## 2018-03-21 ENCOUNTER — Ambulatory Visit (INDEPENDENT_AMBULATORY_CARE_PROVIDER_SITE_OTHER): Payer: Medicare HMO | Admitting: Internal Medicine

## 2018-03-21 VITALS — BP 136/82 | HR 63 | Temp 98.8°F | Ht 69.0 in | Wt 167.0 lb

## 2018-03-21 DIAGNOSIS — R634 Abnormal weight loss: Secondary | ICD-10-CM

## 2018-03-21 DIAGNOSIS — R739 Hyperglycemia, unspecified: Secondary | ICD-10-CM

## 2018-03-21 DIAGNOSIS — E876 Hypokalemia: Secondary | ICD-10-CM | POA: Diagnosis not present

## 2018-03-21 DIAGNOSIS — R0602 Shortness of breath: Secondary | ICD-10-CM | POA: Diagnosis not present

## 2018-03-21 DIAGNOSIS — F319 Bipolar disorder, unspecified: Secondary | ICD-10-CM

## 2018-03-21 DIAGNOSIS — R918 Other nonspecific abnormal finding of lung field: Secondary | ICD-10-CM

## 2018-03-21 DIAGNOSIS — N289 Disorder of kidney and ureter, unspecified: Secondary | ICD-10-CM

## 2018-03-21 DIAGNOSIS — R69 Illness, unspecified: Secondary | ICD-10-CM | POA: Diagnosis not present

## 2018-03-21 DIAGNOSIS — Z79899 Other long term (current) drug therapy: Secondary | ICD-10-CM

## 2018-03-21 NOTE — Progress Notes (Signed)
Patient ID: Judy [name], female   DOB: 01-Feb-1946, 72 y.o.   MRN: [id number]   Location:  [facility]  Provider: DR Arletha Grippe  Code Status:  Goals of Care:  Advanced Directives 07/06/2017  Does Patient Have a Medical Advance Directive? Yes  Type of Advance Directive [place]  Does patient want to make changes to medical advance directive? No - Patient declined  Copy of [place] in Chart? Yes  Would patient like information on creating a medical advance directive? -  Pre-existing out of facility DNR order (yellow form or pink MOST form) -     Chief Complaint  Patient presents with  . Medical Management of Chronic Issues    Follow-up on kidney function, + fall risk, weight down by 46 pounds since 07/2017     HPI: Patient is a 72 y.o. female seen today for medical management of chronic diseases.  She has not been seen since Oct 2018 and she is unable to tell me why. She states she has been f/u with mental health provider on a regular basis. She is no longer taking cogentin but is not sure when she stopped or if a provider told her to stop. She has lost a significant amount of weight (46 lbs) but states she is eating at least 2 meals per day. She c/o SOB. No CP. She has palpitations. No N/V, abdominal pain. No change in bowel habits. She has bladder incontinence at night. No other concerns. She is a poor historian due to psych d/o. Hx obtained from chart. She denies hallucinations, SI/HI.    Past Medical History:  Diagnosis Date  . Allergic rhinitis   . Anemia   . Anxiety   . Arthritis   . Bipolar 1 disorder ([place])   . Depression   . Diverticulosis of colon (without mention of hemorrhage) 08/25/2011   [name]  . 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 Hyperlipidemia   . Hypertension   . Obesity   . Vertigo     Past Surgical History:  Procedure Laterality Date  . COLONOSCOPY  08/25/2011   Procedure: COLONOSCOPY;  Surgeon:  Landry Dyke, MD;  Location: [facility];  Service: Endoscopy;  Laterality: N/A;  . DILATION AND CURETTAGE OF UTERUS  1960's     reports that she has never smoked. She has never used smokeless tobacco. She reports that she does not drink alcohol or use drugs. Social History   Socioeconomic History  . Marital status: Single    Spouse name: Not on file  . Number of children: 1  . Years of education: 13  . Highest education level: Not on file  Occupational History  . Occupation: Retired  Scientific laboratory technician  . Financial resource strain: Not on file  . Food insecurity:    Worry: Not on file    Inability: Not on file  . Transportation needs:    Medical: Not on file    Non-medical: Not on file  Tobacco Use  . Smoking status: Never Smoker  . Smokeless tobacco: Never Used  Substance and Sexual Activity  . Alcohol use: No  . Drug use: No  . Sexual activity: Never  Lifestyle  . Physical activity:    Days per week: Not on file    Minutes per session: Not on file  . Stress: Not on file  Relationships  . Social connections:    Talks on phone: Not on  file    Gets together: Not on file    Attends religious service: Not on file    Active member of club or organization: Not on file    Attends meetings of clubs or organizations: Not on file    Relationship status: Not on file  . Intimate partner violence:    Fear of current or ex partner: Not on file    Emotionally abused: Not on file    Physically abused: Not on file    Forced sexual activity: Not on file  Other Topics Concern  . Not on file  Social History Narrative   Lives alone in house, does not need assist device.  Works part time at [employer]. Has a son.     Cousin and sister in close proximity   Right handed   2 cups coffee per day    Family History  Problem Relation Age of Onset  . Prostate cancer Brother   . Hypertension Brother   . Kidney disease Brother   . Colon cancer Brother   . Hypertension  Mother   . Kidney disease Mother   . Stomach cancer Father   . Hyperlipidemia Sister   . Hypothyroidism Sister   . Stroke Brother   . Prostate cancer Brother     No Known Allergies  Outpatient Encounter Medications as of 03/21/2018  Medication Sig  . acetaminophen (TYLENOL) 500 MG tablet Take 500 mg by mouth daily as needed for moderate pain.  [name] amLODipine (NORVASC) 5 MG tablet TAKE ONE TABLET BY MOUTH EVERY DAY  . buPROPion (WELLBUTRIN XL) 150 MG 24 hr tablet Take 150 mg by mouth every morning. Take one tablet every morning  . famotidine (PEPCID) 20 MG tablet One at bedtime  . FLUoxetine HCl 60 MG TABS Take 60 mg by mouth every morning.  [name] LATUDA 120 MG TABS Take 120 mg by mouth at bedtime. With 350 calories  . linaclotide (LINZESS) 145 MCG CAPS capsule Take 145 mcg by mouth daily as needed (constipation).  . meclizine (ANTIVERT) 25 MG tablet TAKE ONE TABLET BY MOUTH 3 TIMES A DAY AS NEEDED FOR DIZZINESS  . [DISCONTINUED] benztropine (COGENTIN) 1 MG tablet Take 1 tablet (1 mg total) by mouth 2 (two) times daily.  . [DISCONTINUED] Menthol, Topical Analgesic, (BIOFREEZE) 4 % GEL Apply 1 application topically 2 (two) times daily as needed (pain).  . [DISCONTINUED] naproxen sodium (ANAPROX) 220 MG tablet Take 440 mg by mouth daily as needed (pain).   . [DISCONTINUED] pantoprazole (PROTONIX) 40 MG tablet TAKE ONE TABLET BY MOUTH EVERY DAY   No facility-administered encounter medications on file as of 03/21/2018.     Review of Systems:  Review of Systems  Unable to perform ROS: Psychiatric disorder    Health Maintenance  Topic Date Due  . Hepatitis C Screening  November 20, 1945  . TETANUS/TDAP  07/09/1966  . PNA vac Low Risk Adult (2 of 2 - PCV13) 12/12/2014  . INFLUENZA VACCINE  05/11/2018  . MAMMOGRAM  11/09/2019  . COLONOSCOPY  08/24/2021  . DEXA SCAN  Completed    Physical Exam: Vitals:   03/21/18 1353  BP: 136/82  Pulse: 63  Temp: 98.8 F (37.1 C)  TempSrc: Oral  SpO2:  97%  Weight: 167 lb (75.8 kg)  Height: '5\' 9"'$  (1.753 m)   Body mass index is 24.66 kg/m. Physical Exam  Constitutional: She appears well-developed and well-nourished.  HENT:  Mouth/Throat: Oropharynx is clear and moist. No oropharyngeal exudate.  MMM; no oral thrush; poor dentition  Eyes: Pupils are equal, round, and reactive to light. No scleral icterus.  Neck: Neck supple. Carotid bruit is not present. No tracheal deviation present. No thyromegaly present.  Cardiovascular: Normal rate, regular rhythm and intact distal pulses. Exam reveals no gallop and no friction rub.  Murmur (1/6 SEM) heard. No LE edema b/l. no calf TTP.   Pulmonary/Chest: Effort normal and breath sounds normal. No stridor. No respiratory distress. She has no wheezes. She has no rales.  Abdominal: Soft. Normal appearance and bowel sounds are normal. She exhibits no distension and no mass. There is no hepatomegaly. There is no tenderness. There is no rigidity, no rebound and no guarding. No hernia.  obese  Lymphadenopathy:    She has no cervical adenopathy.  Neurological: She is alert. She has normal reflexes.  Skin: Skin is warm and dry. No rash noted.  Psychiatric: She has a normal mood and affect. Her speech is normal and behavior is normal. Judgment and thought content normal. She is not actively hallucinating. Thought content is not delusional. She expresses no homicidal and no suicidal ideation.    Labs reviewed: Basic Metabolic Panel: Recent Labs    03/25/17 1550  05/15/17 2021 05/24/17 2157  05/26/17 0833 05/27/17 0552 06/28/17 0431  NA 139   < >  --   --    < > 140 139 138  K 3.7   < >  --   --    < > 3.5 3.3* 3.2*  CL 105   < >  --   --    < > 105 103 105  CO2 21   < >  --   --    < > '22 28 23  '$ GLUCOSE 99   < >  --   --    < > 120* 103* 88  BUN 17   < >  --   --    < > '8 9 12  '$ CREATININE 1.25*   < >  --   --    < > 0.84 0.83 1.14*  CALCIUM 9.6   < >  --   --    < > 9.1 9.2 9.5  MG  --   --   --    --   --  2.0 2.1  --   PHOS  --   --   --   --   --  3.1 3.3  --   TSH 0.93  --  0.659 1.450  --   --   --   --    < > = values in this interval not displayed.   Liver Function Tests: Recent Labs    05/24/17 2213 05/26/17 0833 05/27/17 0552  AST 30 32 27  ALT '28 30 29  '$ ALKPHOS 74 73 71  BILITOT 0.4 0.6 0.5  PROT 7.5 6.7 6.7  ALBUMIN 4.0 3.5 3.4*   No results for input(s): LIPASE, AMYLASE in the last 8760 hours. No results for input(s): AMMONIA in the last 8760 hours. CBC: Recent Labs    05/26/17 0833 05/27/17 0552 06/28/17 0431  WBC 7.2 7.8 6.2  NEUTROABS 3.8 4.1 3.3  HGB 12.7 12.4 12.2  HCT 38.0 37.3 36.1  MCV 86.6 86.5 87.4  PLT 326 328 292   Lipid Panel: Recent Labs    03/25/17 1550  CHOL 201*  HDL 60  LDLCALC 119*  TRIG 108  CHOLHDL 3.4   Lab Results  Component  Value Date   HGBA1C 5.7 (H) 03/25/2017    Procedures since last visit: No results found.  Assessment/Plan   ICD-10-CM   1. Shortness of breath R06.02 CBC with Differential/Platelets    TSH    DG Chest 2 View    EKG 12-Lead  2. Weight loss, unintentional R63.4 CBC with Differential/Platelets    CMP with eGFR([facility])    Urinalysis with Reflex Microscopic    TSH    DG Chest 2 View  3. High risk medication use Z79.899 CBC with Differential/Platelets    CMP with eGFR([facility])    TSH  4. Bipolar 1 disorder (HCC) F31.9   5. Pulmonary nodules/lesions, multiple R91.8 DG Chest 2 View  6. Hypokalemia E87.6   7. Renal insufficiency N28.9 CMP with eGFR([facility])  8. Hyperglycemia R73.9 CANCELED: Hemoglobin A1c      Will call with lab results  Will call with xray results  Continue current medications as ordered  Follow up with behavioral health as scheduled  Follow up in 1 month for SOB, weight loss  Josilyn Shippee S. Perlie Gold  [facility] [address] [place], [place] [address] [phone] Cell (Monday-Friday 8 AM - 5 PM) [phone]  After 5 PM and follow prompts

## 2018-03-21 NOTE — Patient Instructions (Addendum)
Will call with lab results  Will call with xray results  Continue current medications as ordered  Follow up with behavioral health as scheduled  Follow up in 1 month for SOB, weight loss

## 2018-03-22 DIAGNOSIS — R0602 Shortness of breath: Secondary | ICD-10-CM | POA: Diagnosis not present

## 2018-03-22 DIAGNOSIS — N289 Disorder of kidney and ureter, unspecified: Secondary | ICD-10-CM | POA: Diagnosis not present

## 2018-03-22 DIAGNOSIS — Z79899 Other long term (current) drug therapy: Secondary | ICD-10-CM | POA: Diagnosis not present

## 2018-03-22 DIAGNOSIS — R634 Abnormal weight loss: Secondary | ICD-10-CM | POA: Diagnosis not present

## 2018-03-22 LAB — CBC WITH DIFFERENTIAL/PLATELET
BASOS ABS: 18 {cells}/uL (ref 0–200)
BASOS PCT: 0.3 %
EOS PCT: 0.5 %
Eosinophils Absolute: 31 cells/uL (ref 15–500)
HEMATOCRIT: 37.9 % (ref 35.0–45.0)
HEMOGLOBIN: 13.4 g/dL (ref 11.7–15.5)
LYMPHS ABS: 1867 {cells}/uL (ref 850–3900)
MCH: 31.3 pg (ref 27.0–33.0)
MCHC: 35.4 g/dL (ref 32.0–36.0)
MCV: 88.6 fL (ref 80.0–100.0)
MPV: 10.9 fL (ref 7.5–12.5)
Monocytes Relative: 7.8 %
NEUTROS ABS: 3709 {cells}/uL (ref 1500–7800)
Neutrophils Relative %: 60.8 %
Platelets: 266 10*3/uL (ref 140–400)
RBC: 4.28 10*6/uL (ref 3.80–5.10)
RDW: 13.6 % (ref 11.0–15.0)
Total Lymphocyte: 30.6 %
WBC mixed population: 476 cells/uL (ref 200–950)
WBC: 6.1 10*3/uL (ref 3.8–10.8)

## 2018-03-22 LAB — COMPLETE METABOLIC PANEL WITH GFR
AG Ratio: 1.5 (calc) (ref 1.0–2.5)
ALBUMIN MSPROF: 4.2 g/dL (ref 3.6–5.1)
ALT: 15 U/L (ref 6–29)
AST: 20 U/L (ref 10–35)
Alkaline phosphatase (APISO): 69 U/L (ref 33–130)
BUN / CREAT RATIO: 9 (calc) (ref 6–22)
BUN: 11 mg/dL (ref 7–25)
CALCIUM: 10.1 mg/dL (ref 8.6–10.4)
CO2: 23 mmol/L (ref 20–32)
CREATININE: 1.16 mg/dL — AB (ref 0.60–0.93)
Chloride: 103 mmol/L (ref 98–110)
GFR, EST AFRICAN AMERICAN: 55 mL/min/{1.73_m2} — AB (ref >=60)
GFR, EST NON AFRICAN AMERICAN: 48 mL/min/{1.73_m2} — AB (ref >=60)
GLOBULIN: 2.8 g/dL (ref 1.9–3.7)
Glucose, Bld: 76 mg/dL (ref 65–99)
Potassium: 3.5 mmol/L (ref 3.5–5.3)
SODIUM: 139 mmol/L (ref 135–146)
TOTAL PROTEIN: 7 g/dL (ref 6.1–8.1)
Total Bilirubin: 0.5 mg/dL (ref 0.2–1.2)

## 2018-03-22 LAB — TSH: TSH: 0.93 m[IU]/L (ref 0.40–4.50)

## 2018-03-23 ENCOUNTER — Telehealth: Payer: Self-pay | Admitting: Internal Medicine

## 2018-03-23 LAB — URINALYSIS, ROUTINE W REFLEX MICROSCOPIC
Bilirubin Urine: NEGATIVE
GLUCOSE, UA: NEGATIVE
Hgb urine dipstick: NEGATIVE
Ketones, ur: NEGATIVE
LEUKOCYTES UA: NEGATIVE
Nitrite: NEGATIVE
PH: 5.5 (ref 5.0–8.0)
Protein, ur: NEGATIVE
Specific Gravity, Urine: 1.023 (ref 1.001–1.03)

## 2018-03-23 NOTE — Telephone Encounter (Signed)
Tried calling sue back, was placed on hold for over 5 minutes. Hit option 1 to be sent to voicemail. Message left for her to give [place] a call back.

## 2018-03-24 NOTE — Telephone Encounter (Signed)
Calle dSue at the pharmacy to see which medication was denied and I had to leave message for her to call back.

## 2018-03-27 NOTE — Telephone Encounter (Signed)
Attempted to contact [facility]. Received a message stating that due to high call volumes I would need to leave a message and someone would return my call. I have left a message for our call to be returned.

## 2018-03-28 ENCOUNTER — Other Ambulatory Visit: Payer: Self-pay | Admitting: Internal Medicine

## 2018-03-29 NOTE — Telephone Encounter (Signed)
Proberta, was on hold over 8 minutes with no answer.  Left another voicemail to see what medication is denied.

## 2018-03-30 NOTE — Telephone Encounter (Signed)
Called and spoke to [place] with [facility]. Lexine Baton was calling for update on refill denial for Pepcid 20mg . Pt was instructed to f/u PRN at 06/09/17 OV.  Lexine Baton is aware that Rx request should be deferred to PCP. Nothing further is needed.

## 2018-04-05 DIAGNOSIS — Z801 Family history of malignant neoplasm of trachea, bronchus and lung: Secondary | ICD-10-CM | POA: Diagnosis not present

## 2018-04-05 DIAGNOSIS — Z1379 Encounter for other screening for genetic and chromosomal anomalies: Secondary | ICD-10-CM | POA: Diagnosis not present

## 2018-04-05 DIAGNOSIS — Z8051 Family history of malignant neoplasm of kidney: Secondary | ICD-10-CM | POA: Diagnosis not present

## 2018-04-05 DIAGNOSIS — Z1371 Encounter for nonprocreative screening for genetic disease carrier status: Secondary | ICD-10-CM | POA: Diagnosis not present

## 2018-04-05 DIAGNOSIS — Z1509 Genetic susceptibility to other malignant neoplasm: Secondary | ICD-10-CM | POA: Diagnosis not present

## 2018-04-05 DIAGNOSIS — C259 Malignant neoplasm of pancreas, unspecified: Secondary | ICD-10-CM | POA: Diagnosis not present

## 2018-04-05 DIAGNOSIS — Z800 Family history of malignant neoplasm of digestive organs: Secondary | ICD-10-CM | POA: Diagnosis not present

## 2018-04-06 DIAGNOSIS — Z79891 Long term (current) use of opiate analgesic: Secondary | ICD-10-CM | POA: Diagnosis not present

## 2018-04-06 DIAGNOSIS — R69 Illness, unspecified: Secondary | ICD-10-CM | POA: Diagnosis not present

## 2018-04-14 ENCOUNTER — Other Ambulatory Visit: Payer: Self-pay | Admitting: Internal Medicine

## 2018-04-24 ENCOUNTER — Other Ambulatory Visit: Payer: Self-pay | Admitting: Internal Medicine

## 2018-04-26 ENCOUNTER — Telehealth: Payer: Self-pay | Admitting: Internal Medicine

## 2018-04-26 NOTE — Telephone Encounter (Signed)
This medication is not listed on her active medication list. She also needs an OV. I have left a message with [place] at [facility] letting her know this.

## 2018-05-01 DIAGNOSIS — J029 Acute pharyngitis, unspecified: Secondary | ICD-10-CM | POA: Diagnosis not present

## 2018-05-24 ENCOUNTER — Ambulatory Visit (INDEPENDENT_AMBULATORY_CARE_PROVIDER_SITE_OTHER): Payer: Medicare HMO | Admitting: Internal Medicine

## 2018-05-24 ENCOUNTER — Encounter: Payer: Self-pay | Admitting: Internal Medicine

## 2018-05-24 VITALS — BP 142/70 | HR 60 | Temp 99.1°F | Ht 69.0 in | Wt 160.0 lb

## 2018-05-24 DIAGNOSIS — R918 Other nonspecific abnormal finding of lung field: Secondary | ICD-10-CM

## 2018-05-24 DIAGNOSIS — G2401 Drug induced subacute dyskinesia: Secondary | ICD-10-CM

## 2018-05-24 DIAGNOSIS — F319 Bipolar disorder, unspecified: Secondary | ICD-10-CM

## 2018-05-24 DIAGNOSIS — R634 Abnormal weight loss: Secondary | ICD-10-CM | POA: Diagnosis not present

## 2018-05-24 DIAGNOSIS — R5381 Other malaise: Secondary | ICD-10-CM

## 2018-05-24 DIAGNOSIS — R69 Illness, unspecified: Secondary | ICD-10-CM | POA: Diagnosis not present

## 2018-05-24 NOTE — Progress Notes (Signed)
Patient ID: Judy [name], female   DOB: [birth year], 72 y.o.   MRN: [id number]   Location:  [facility] OFFICE  Provider: DR Arletha Grippe  Code Status:  Goals of Care:  Advanced Directives 07/06/2017  Does Patient Have a Medical Advance Directive? Yes  Type of Advance Directive [place]  Does patient want to make changes to medical advance directive? No - Patient declined  Copy of [place] in Chart? Yes  Would patient like information on creating a medical advance directive? -  Pre-existing out of facility DNR order (yellow form or pink MOST form) -     Chief Complaint  Patient presents with  . Medical Management of Chronic Issues    1 month follow-up on shortness of breath and weight loss   . Screening    Audit C Screening Neg   . Medication Refill    Linzess  . Health Maintenance    Discuss need for Hep C, Tdap, Zoster, and PNA    HPI: Patient is a 72 y.o. female seen today for medical management of chronic diseases.  Weight down 7 lbs, unintentionally since June 2019 (she has gone from 234lbs>>160lbs since Aug 2018). She states appetite is good. She does not miss any meals. She is a poor historian due to psych d/o. Hx obtained from chart.  Back pain - chronic. She was seen in the ED 06/28/17 for leg pain. Family c/a cause of pain. Hip xray done 06/28/17 showed early degenerative changes. she then had MRI cervical, lumbar and brain. She had right L4 nerve root block and epidural by Dr Ellene Route on 06/02/17 for severe spinal stenosis. Injection ineffective and she was told she needs surgery.   Pulmonary nodules - noted on CTA chest. Followed by pulmonary Dr Melvyn Novas. She has chest tightness at rest and with ambulation. No wheezing. She was employed at [employer], Producer, television/film/video. She was not req'd to wear mask and she did not. She has never smoked cigs but has had second hand smoke exposure at home as a child. No hx child hood asthma. No cough or  hemoptysis.  Depression/anxiety/bipolar - mood stable on wellbutrin XL, fluoxetine, latuda. seroquel stopped 05/15/17 in ED 2/2 c/a TD. Cogentin started. Followed by Johna Roles, PAC. A1c 5.6%  HTN - BP stable on amlodipine. She had a nuclear stress test in Jan 2018 that was abnormal. She f/u with cardio who thought abnormality was due to shifting breast attenuation. EF nml  Arthritis/chronic right knee pain - stable. She has occasional joint pain controlled with advil OTC. She had burning and stinging in right anterior thigh not long ago that resolved with advil  Hyperlipidemia - diet controlled. LDL 117; HDL 61  GERD - pulmonary started on pepcid and changed pantoprazole to daily. She stopped both meds and has not had any issues. She takes Linzess prn constipation  Hyperglycemia - A1c 5.7%    Past Medical History:  Diagnosis Date  . Allergic rhinitis   . Anemia   . Anxiety   . Arthritis   . Bipolar 1 disorder ([place])   . Depression   . Diverticulosis of colon (without mention of hemorrhage) 08/25/2011   [name]  . 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 Hyperlipidemia   . Hypertension   . Obesity   . Vertigo     Past Surgical History:  Procedure Laterality Date  . COLONOSCOPY  08/25/2011   Procedure: COLONOSCOPY;  Surgeon: Landry Dyke, MD;  Location: [facility];  Service: Endoscopy;  Laterality: N/A;  . DILATION AND CURETTAGE OF UTERUS  1960's     reports that she has never smoked. She has never used smokeless tobacco. She reports that she does not drink alcohol or use drugs. Social History   Socioeconomic History  . Marital status: Single    Spouse name: Not on file  . Number of children: 1  . Years of education: 18  . Highest education level: Not on file  Occupational History  . Occupation: Retired  Scientific laboratory technician  . Financial resource strain: Not on file  . Food insecurity:    Worry: Not on file    Inability: Not on file  . Transportation needs:     Medical: Not on file    Non-medical: Not on file  Tobacco Use  . Smoking status: Never Smoker  . Smokeless tobacco: Never Used  Substance and Sexual Activity  . Alcohol use: No  . Drug use: No  . Sexual activity: Never  Lifestyle  . Physical activity:    Days per week: Not on file    Minutes per session: Not on file  . Stress: Not on file  Relationships  . Social connections:    Talks on phone: Not on file    Gets together: Not on file    Attends religious service: Not on file    Active member of club or organization: Not on file    Attends meetings of clubs or organizations: Not on file    Relationship status: Not on file  . Intimate partner violence:    Fear of current or ex partner: Not on file    Emotionally abused: Not on file    Physically abused: Not on file    Forced sexual activity: Not on file  Other Topics Concern  . Not on file  Social History Narrative   Lives alone in house, does not need assist device.  Works part time at [employer]. Has a son.     Cousin and sister in close proximity   Right handed   2 cups coffee per day    Family History  Problem Relation Age of Onset  . Prostate cancer Brother   . Hypertension Brother   . Kidney disease Brother   . Colon cancer Brother   . Hypertension Mother   . Kidney disease Mother   . Stomach cancer Father   . Hyperlipidemia Sister   . Hypothyroidism Sister   . Stroke Brother   . Prostate cancer Brother     No Known Allergies  Outpatient Encounter Medications as of 05/24/2018  Medication Sig  . acetaminophen (TYLENOL) 500 MG tablet Take 500 mg by mouth daily as needed for moderate pain.  [name] amLODipine (NORVASC) 5 MG tablet TAKE ONE TABLET BY MOUTH EVERY DAY  . buPROPion (WELLBUTRIN XL) 150 MG 24 hr tablet Take 150 mg by mouth every morning. Take one tablet every morning  . famotidine (PEPCID) 20 MG tablet One at bedtime  . FLUoxetine HCl 60 MG TABS Take 60 mg by mouth every morning.   [name] LATUDA 120 MG TABS Take 120 mg by mouth at bedtime. With 350 calories  . linaclotide (LINZESS) 145 MCG CAPS capsule Take 145 mcg by mouth daily as needed (constipation).  . meclizine (ANTIVERT) 25 MG tablet TAKE ONE TABLET BY MOUTH 3 TIMES A DAY AS NEEDED FOR DIZZINESS   No facility-administered encounter medications on  file as of 05/24/2018.     Review of Systems:  Review of Systems  Unable to perform ROS: Psychiatric disorder    Health Maintenance  Topic Date Due  . Hepatitis C Screening  [birth year]  . TETANUS/TDAP  07/09/1966  . PNA vac Low Risk Adult (2 of 2 - PCV13) 12/12/2014  . INFLUENZA VACCINE  05/11/2018  . MAMMOGRAM  11/09/2019  . COLONOSCOPY  08/24/2021  . DEXA SCAN  Completed    Physical Exam: Vitals:   05/24/18 1344  BP: (!) 142/70  Pulse: 60  Temp: 99.1 F (37.3 C)  TempSrc: Oral  SpO2: 99%  Weight: 160 lb (72.6 kg)  Height: 5\' 9"  (1.753 m)   Body mass index is 23.63 kg/m. Physical Exam  Constitutional: She appears well-developed.  Frail appearing in NAD, constant swallowing  HENT:  Mouth/Throat: Oropharynx is clear and moist. No oropharyngeal exudate.  MMM; no oral thrush  Eyes: Pupils are equal, round, and reactive to light. No scleral icterus.  Neck: Neck supple. Carotid bruit is not present. No tracheal deviation present. No thyromegaly present.  Cardiovascular: Normal rate, regular rhythm and intact distal pulses. Exam reveals no gallop and no friction rub.  Murmur (1/6 SEM) heard. No LE edema b/l. no calf TTP.   Pulmonary/Chest: Effort normal and breath sounds normal. No stridor. No respiratory distress. She has no wheezes. She has no rales.  Abdominal: Soft. Normal appearance and bowel sounds are normal. She exhibits distension. She exhibits no mass. There is no hepatomegaly. There is no tenderness. There is no rigidity, no rebound and no guarding. No hernia.  Lymphadenopathy:    She has no cervical adenopathy.  Neurological: She is alert.  She has normal reflexes.  Skin: Skin is warm and dry. No rash noted.  Psychiatric: She has a normal mood and affect. Her behavior is normal. Thought content normal. Her speech is slurred.    Labs reviewed: Basic Metabolic Panel: Recent Labs    05/24/17 2157  05/26/17 0833 05/27/17 0552 06/28/17 0431 03/21/18 1515  NA  --    < > 140 139 138 139  K  --    < > 3.5 3.3* 3.2* 3.5  CL  --    < > 105 103 105 103  CO2  --    < > 22 28 23 23   GLUCOSE  --    < > 120* 103* 88 76  BUN  --    < > 8 9 12 11   CREATININE  --    < > 0.84 0.83 1.14* 1.16*  CALCIUM  --    < > 9.1 9.2 9.5 10.1  MG  --   --  2.0 2.1  --   --   PHOS  --   --  3.1 3.3  --   --   TSH 1.450  --   --   --   --  0.93   < > = values in this interval not displayed.   Liver Function Tests: Recent Labs    05/24/17 2213 05/26/17 0833 05/27/17 0552 03/21/18 1515  AST 30 32 27 20  ALT 28 30 29 15   ALKPHOS 74 73 71  --   BILITOT 0.4 0.6 0.5 0.5  PROT 7.5 6.7 6.7 7.0  ALBUMIN 4.0 3.5 3.4*  --    No results for input(s): LIPASE, AMYLASE in the last 8760 hours. No results for input(s): AMMONIA in the last 8760 hours. CBC: Recent Labs    05/27/17  1610 06/28/17 0431 03/21/18 1515  WBC 7.8 6.2 6.1  NEUTROABS 4.1 3.3 3,709  HGB 12.4 12.2 13.4  HCT 37.3 36.1 37.9  MCV 86.5 87.4 88.6  PLT 328 292 266   Lipid Panel: No results for input(s): CHOL, HDL, LDLCALC, TRIG, CHOLHDL, LDLDIRECT in the last 8760 hours. Lab Results  Component Value Date   HGBA1C 5.7 (H) 03/25/2017    Procedures since last visit: No results found.  Assessment/Plan   ICD-10-CM   1. Physical deconditioning R53.81   2. Tardive dyskinesia G24.01   3. Bipolar 1 disorder (HCC) F31.9   4. Weight loss, unintentional R63.4   5. Pulmonary nodules/lesions, multiple R91.8   6. Abnormal findings on diagnostic imaging of lung R91.8 CT CHEST NODULE FOLLOW UP LOW DOSE W/O   Continue current medications as ordered - she declines resuming PPI tx at  this time  PLEASE [place]  Will call with CT results  Follow up in 3 mos with Judy [name] for wt loss, HTN, GERD, bipolar withTD    Judy [name] S. Perlie Gold  [facility] [address] [place],  [address] [phone] Cell (Monday-Friday 8 AM - 5 PM) [phone] After 5 PM and follow prompts

## 2018-05-24 NOTE — Patient Instructions (Addendum)
Continue current medications as ordered  PLEASE SPEAK WITH TERESA HURTZ REGARDING MEDICATION CAUSE OF WEIGHT LOSS  Will call with CT results  Follow up in 3 mos with Janett Billow for wt loss, HTN, GERD, bipolar withTD

## 2018-05-31 ENCOUNTER — Telehealth: Payer: Self-pay | Admitting: Internal Medicine

## 2018-05-31 ENCOUNTER — Encounter: Payer: Self-pay | Admitting: Internal Medicine

## 2018-05-31 NOTE — Telephone Encounter (Signed)
I called the pt to schedule AWV w/ Clarise Cruz, but there was no answer.  I will try again later. VDM (DD)

## 2018-06-08 ENCOUNTER — Inpatient Hospital Stay: Admission: RE | Admit: 2018-06-08 | Payer: Medicare HMO | Source: Ambulatory Visit

## 2018-06-19 DIAGNOSIS — R69 Illness, unspecified: Secondary | ICD-10-CM | POA: Diagnosis not present

## 2018-06-29 ENCOUNTER — Other Ambulatory Visit: Payer: Self-pay

## 2018-06-29 DIAGNOSIS — R918 Other nonspecific abnormal finding of lung field: Secondary | ICD-10-CM

## 2018-07-07 ENCOUNTER — Inpatient Hospital Stay: Admission: RE | Admit: 2018-07-07 | Payer: Medicare HMO | Source: Ambulatory Visit

## 2018-07-07 ENCOUNTER — Emergency Department (HOSPITAL_COMMUNITY): Payer: Medicare HMO

## 2018-07-07 ENCOUNTER — Emergency Department (HOSPITAL_COMMUNITY)
Admission: EM | Admit: 2018-07-07 | Discharge: 2018-07-08 | Disposition: A | Discharge status: Home / Self Care | Payer: Medicare HMO | Attending: Emergency Medicine | Admitting: Emergency Medicine

## 2018-07-07 ENCOUNTER — Encounter (HOSPITAL_COMMUNITY): Payer: Self-pay | Admitting: Emergency Medicine

## 2018-07-07 DIAGNOSIS — S8991XA Unspecified injury of right lower leg, initial encounter: Secondary | ICD-10-CM | POA: Diagnosis not present

## 2018-07-07 DIAGNOSIS — Y92009 Unspecified place in unspecified non-institutional (private) residence as the place of occurrence of the external cause: Secondary | ICD-10-CM | POA: Insufficient documentation

## 2018-07-07 DIAGNOSIS — W19XXXA Unspecified fall, initial encounter: Secondary | ICD-10-CM | POA: Diagnosis not present

## 2018-07-07 DIAGNOSIS — S99921A Unspecified injury of right foot, initial encounter: Secondary | ICD-10-CM | POA: Diagnosis not present

## 2018-07-07 DIAGNOSIS — R531 Weakness: Secondary | ICD-10-CM | POA: Insufficient documentation

## 2018-07-07 DIAGNOSIS — Z79899 Other long term (current) drug therapy: Secondary | ICD-10-CM | POA: Diagnosis not present

## 2018-07-07 DIAGNOSIS — S4991XA Unspecified injury of right shoulder and upper arm, initial encounter: Secondary | ICD-10-CM | POA: Diagnosis not present

## 2018-07-07 DIAGNOSIS — W1839XA Other fall on same level, initial encounter: Secondary | ICD-10-CM | POA: Insufficient documentation

## 2018-07-07 DIAGNOSIS — N183 Chronic kidney disease, stage 3 (moderate): Secondary | ICD-10-CM | POA: Insufficient documentation

## 2018-07-07 DIAGNOSIS — S199XXA Unspecified injury of neck, initial encounter: Secondary | ICD-10-CM | POA: Diagnosis not present

## 2018-07-07 DIAGNOSIS — M79604 Pain in right leg: Secondary | ICD-10-CM | POA: Diagnosis not present

## 2018-07-07 DIAGNOSIS — M79631 Pain in right forearm: Secondary | ICD-10-CM | POA: Diagnosis not present

## 2018-07-07 DIAGNOSIS — M79601 Pain in right arm: Secondary | ICD-10-CM | POA: Insufficient documentation

## 2018-07-07 DIAGNOSIS — R270 Ataxia, unspecified: Secondary | ICD-10-CM | POA: Diagnosis not present

## 2018-07-07 DIAGNOSIS — Y999 Unspecified external cause status: Secondary | ICD-10-CM | POA: Diagnosis not present

## 2018-07-07 DIAGNOSIS — S299XXA Unspecified injury of thorax, initial encounter: Secondary | ICD-10-CM | POA: Diagnosis not present

## 2018-07-07 DIAGNOSIS — Y9389 Activity, other specified: Secondary | ICD-10-CM | POA: Insufficient documentation

## 2018-07-07 DIAGNOSIS — S6991XA Unspecified injury of right wrist, hand and finger(s), initial encounter: Secondary | ICD-10-CM | POA: Diagnosis not present

## 2018-07-07 DIAGNOSIS — M79651 Pain in right thigh: Secondary | ICD-10-CM | POA: Diagnosis not present

## 2018-07-07 DIAGNOSIS — M7918 Myalgia, other site: Secondary | ICD-10-CM | POA: Insufficient documentation

## 2018-07-07 DIAGNOSIS — I1310 Hypertensive heart and chronic kidney disease without heart failure, with stage 1 through stage 4 chronic kidney disease, or unspecified chronic kidney disease: Secondary | ICD-10-CM | POA: Diagnosis not present

## 2018-07-07 DIAGNOSIS — R52 Pain, unspecified: Secondary | ICD-10-CM

## 2018-07-07 DIAGNOSIS — R079 Chest pain, unspecified: Secondary | ICD-10-CM | POA: Diagnosis not present

## 2018-07-07 DIAGNOSIS — S0990XA Unspecified injury of head, initial encounter: Secondary | ICD-10-CM | POA: Diagnosis not present

## 2018-07-07 DIAGNOSIS — I491 Atrial premature depolarization: Secondary | ICD-10-CM | POA: Diagnosis not present

## 2018-07-07 DIAGNOSIS — S99911A Unspecified injury of right ankle, initial encounter: Secondary | ICD-10-CM | POA: Diagnosis not present

## 2018-07-07 DIAGNOSIS — S79921A Unspecified injury of right thigh, initial encounter: Secondary | ICD-10-CM | POA: Diagnosis not present

## 2018-07-07 DIAGNOSIS — S161XXA Strain of muscle, fascia and tendon at neck level, initial encounter: Secondary | ICD-10-CM | POA: Diagnosis not present

## 2018-07-07 DIAGNOSIS — M25511 Pain in right shoulder: Secondary | ICD-10-CM | POA: Diagnosis not present

## 2018-07-07 DIAGNOSIS — M79621 Pain in right upper arm: Secondary | ICD-10-CM | POA: Diagnosis not present

## 2018-07-07 DIAGNOSIS — S59911A Unspecified injury of right forearm, initial encounter: Secondary | ICD-10-CM | POA: Diagnosis not present

## 2018-07-07 DIAGNOSIS — R251 Tremor, unspecified: Secondary | ICD-10-CM | POA: Insufficient documentation

## 2018-07-07 DIAGNOSIS — M25521 Pain in right elbow: Secondary | ICD-10-CM | POA: Diagnosis not present

## 2018-07-07 DIAGNOSIS — S59901A Unspecified injury of right elbow, initial encounter: Secondary | ICD-10-CM | POA: Diagnosis not present

## 2018-07-07 DIAGNOSIS — M79661 Pain in right lower leg: Secondary | ICD-10-CM | POA: Diagnosis not present

## 2018-07-07 DIAGNOSIS — R202 Paresthesia of skin: Secondary | ICD-10-CM | POA: Diagnosis not present

## 2018-07-07 DIAGNOSIS — M79641 Pain in right hand: Secondary | ICD-10-CM | POA: Diagnosis not present

## 2018-07-07 HISTORY — DX: Disorder of kidney and ureter, unspecified: N28.9

## 2018-07-07 LAB — CBC
HCT: 39.2 % (ref 36.0–46.0)
HEMOGLOBIN: 12.6 g/dL (ref 12.0–15.0)
MCH: 29.9 pg (ref 26.0–34.0)
MCHC: 32.1 g/dL (ref 30.0–36.0)
MCV: 93.1 fL (ref 78.0–100.0)
Platelets: 287 10*3/uL (ref 150–400)
RBC: 4.21 MIL/uL (ref 3.87–5.11)
RDW: 14 % (ref 11.5–15.5)
WBC: 7.2 10*3/uL (ref 4.0–10.5)

## 2018-07-07 LAB — PROTIME-INR
INR: 1.14
Prothrombin Time: 14.5 seconds (ref 11.4–15.2)

## 2018-07-07 LAB — BASIC METABOLIC PANEL
Anion gap: 8 (ref 5–15)
BUN: 14 mg/dL (ref 8–23)
CHLORIDE: 103 mmol/L (ref 98–111)
CO2: 27 mmol/L (ref 22–32)
Calcium: 9.8 mg/dL (ref 8.9–10.3)
Creatinine, Ser: 1.19 mg/dL — ABNORMAL HIGH (ref 0.44–1.00)
GFR calc Af Amer: 52 mL/min — ABNORMAL LOW (ref >60)
GFR calc non Af Amer: 45 mL/min — ABNORMAL LOW (ref >60)
Glucose, Bld: 107 mg/dL — ABNORMAL HIGH (ref 70–99)
POTASSIUM: 3.5 mmol/L (ref 3.5–5.1)
SODIUM: 138 mmol/L (ref 135–145)

## 2018-07-07 NOTE — ED Provider Notes (Addendum)
[facility] EMERGENCY DEPARTMENT Provider Note   CSN: [id number] Arrival date & time: 07/07/18  1759     History   Chief Complaint Chief Complaint  Patient presents with  . Fall    HPI Judy [name] is a 72 y.o. female.  HPI   72 year old female with PMH significant for MDD, BP 1, CKD stage III, who presents status post fall.  Patient states that roughly 2 hours prior to arrival she was attempting to sit in a chair when she "missed the chair."  Patient states landing on her right side with immediate onset of pain on her entire right side.  Patient attempted to raise her esophagram was noted to be "weak" on her right side which was new for her.  Patient also endorses right upper extremity low-grade tremor which she states is also new for her.  Patient was able get herself off the ground after roughly 2 hours of attempts.  EMS was called by the patient who found patient sitting on rolling walker.  Patient transferred to was scanned without event.  EMS states inconsistent history is from patient with multiple stories.    Past Medical History:  Diagnosis Date  . Allergic rhinitis   . Anemia   . Anxiety   . Arthritis   . Bipolar 1 disorder ([place])   . Depression   . Diverticulosis of colon (without mention of hemorrhage) 08/25/2011   [name]  . 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 Hyperlipidemia   . Hypertension   . Obesity   . Renal disorder   . Vertigo     Patient Active Problem List   Diagnosis Date Noted  . Solitary pulmonary nodule on lung CT 06/13/2017  . Spinal stenosis at L4-L5 level   . Dysphagia 05/25/2017  . Tardive dyskinesia 05/25/2017  . Dyspnea on exertion 05/25/2017  . Fall 05/25/2017  . Bilateral leg pain   . Chronic pain of right knee 03/25/2017  . High risk medication use 03/25/2017  . Family hx of colon cancer 03/25/2017  . Occupational exposure in workplace 03/25/2017  . Depressed bipolar I disorder in partial  remission ([place]) 03/25/2017  . Routine general medical examination at a health care facility 06/04/2014  . Hyperlipidemia with target LDL less than 130 03/20/2014  . Essential hypertension, benign 03/20/2014  . Unspecified constipation 03/20/2014  . Light headedness 03/20/2014  . Acute on chronic kidney disease, stage 3 03/20/2014  . Hypokalemia 03/20/2014  . Encounter for therapeutic drug monitoring 03/20/2014  . Other dysphagia 12/26/2013  . GERD (gastroesophageal reflux disease) 12/21/2013  . Constipation 12/21/2013  . Syncope 12/10/2013  . Leukocytosis 12/10/2013  . URI (upper respiratory infection) 12/10/2013  . Bipolar 1 disorder ([place])   . Obesity (BMI 30-39.9)   . Hearing loss in left ear   . Vertigo   . HYPERCHOLESTEROLEMIA 10/08/2010  . DEPRESSION 10/08/2010  . HYPERTENSION 10/08/2010  . ALLERGIC RHINITIS 10/08/2010    Past Surgical History:  Procedure Laterality Date  . COLONOSCOPY  08/25/2011   Procedure: COLONOSCOPY;  Surgeon: Landry Dyke, MD;  Location: [facility] ENDOSCOPY;  Service: Endoscopy;  Laterality: N/A;  . DILATION AND CURETTAGE OF UTERUS  1960's     OB History   None      Home Medications    Prior to Admission medications   Medication Sig Start Date End Date Taking? Authorizing Provider  acetaminophen (TYLENOL) 500 MG tablet Take 500 mg by mouth daily  as needed for moderate pain.    [provider]  amLODipine (NORVASC) 5 MG tablet TAKE ONE TABLET BY MOUTH EVERY DAY 02/28/18   [name], DO  buPROPion (WELLBUTRIN XL) 150 MG 24 hr tablet Take 150 mg by mouth every morning. Take one tablet every morning 02/28/17   [provider]  FLUoxetine HCl 60 MG TABS Take 60 mg by mouth every morning. 08/03/16   [provider]  LATUDA 120 MG TABS Take 120 mg by mouth at bedtime. With 350 calories 07/31/16   [provider]  linaclotide (LINZESS) 145 MCG CAPS capsule Take 145 mcg by mouth daily as needed (constipation).     [provider]  meclizine (ANTIVERT) 25 MG tablet TAKE ONE TABLET BY MOUTH 3 TIMES A DAY AS NEEDED FOR DIZZINESS 02/28/18   [name], DO    Family History Family History  Problem Relation Age of Onset  . Prostate cancer Brother   . Hypertension Brother   . Kidney disease Brother   . Colon cancer Brother   . Hypertension Mother   . Kidney disease Mother   . Stomach cancer Father   . Hyperlipidemia Sister   . Hypothyroidism Sister   . Stroke Brother   . Prostate cancer Brother     Social History Social History   Tobacco Use  . Smoking status: Never Smoker  . Smokeless tobacco: Never Used  Substance Use Topics  . Alcohol use: No  . Drug use: No     Allergies   Patient has no known allergies.   Review of Systems Review of Systems  Constitutional: Negative for chills and fever.  HENT: Negative for ear pain and sore throat.   Eyes: Negative for pain and visual disturbance.  Respiratory: Negative for cough and shortness of breath.   Cardiovascular: Negative for chest pain and palpitations.  Gastrointestinal: Negative for abdominal pain, nausea and vomiting.  Genitourinary: Negative for dysuria and hematuria.  Musculoskeletal: Positive for arthralgias and myalgias. Negative for back pain.  Skin: Negative for color change and rash.  Neurological: Positive for weakness. Negative for seizures, syncope and facial asymmetry.  All other systems reviewed and are negative.    Physical Exam Updated Vital Signs BP (!) 146/76   Pulse 66   Temp 98.4 F (36.9 C) (Oral)   Resp 20   Ht 5\' 10"  (1.778 m)   Wt 74.8 kg   SpO2 100%   BMI 23.68 kg/m   Physical Exam  Constitutional: She appears well-developed and well-nourished. No distress.  Cachectic  HENT:  Head: Normocephalic and atraumatic.  Eyes: Conjunctivae are normal.  Neck: Neck supple.  Cardiovascular: Normal rate and regular rhythm.  No murmur heard. Pulmonary/Chest: Effort normal and breath  sounds normal. No respiratory distress.  Abdominal: Soft. There is no tenderness.  Musculoskeletal: She exhibits no edema.  TTP noted to entire right upper extremity, right hip and right femur.  Patient with midline cervical TTP.  No obvious head wounds noted.  Neurological: She is alert.  PERRLA, cranial nerves II through XII intact, 4/5 motor in bilateral upper and lower extremities, patient normal finger-to-nose bilaterally.  Patient with inconsistent neurological exam.  Patient with drift noted right lower extremity with drift also noted in the left upper extremity.  Normal sensation throughout.  Skin: Skin is warm and dry.  Psychiatric: She has a normal mood and affect.  Nursing note and vitals reviewed.    ED Treatments / Results  Labs (all labs ordered are  listed, but only abnormal results are displayed) Labs Reviewed  BASIC METABOLIC PANEL - Abnormal; Notable for the following components:      Result Value   Glucose, Bld 107 (*)    Creatinine, Ser 1.19 (*)    GFR calc non Af Amer 45 (*)    GFR calc Af Amer 52 (*)    All other components within normal limits  CBC  PROTIME-INR  URINALYSIS, DIPSTICK ONLY    EKG EKG Interpretation  Date/Time:  Friday July 07 2018 22:29:56 EDT Ventricular Rate:  60 PR Interval:    QRS Duration: 88 QT Interval:  458 QTC Calculation: 458 R Axis:   3 Text Interpretation:  Sinus rhythm Atrial premature complex No acute changes No significant change since last tracing Confirmed by Varney Biles [phone]) on 07/07/2018 10:45:17 PM   Radiology Dg Chest 1 View  Result Date: 07/07/2018 CLINICAL DATA:  Pain after fall EXAM: CHEST  1 VIEW COMPARISON:  March 21, 2018 FINDINGS: The heart size and mediastinal contours are within normal limits. Both lungs are clear. The visualized skeletal structures are unremarkable. IMPRESSION: No active disease. Electronically Signed   By: Dorise Bullion III M.D   On: 07/07/2018 19:50   Dg Pelvis 1-2  Views  Result Date: 07/07/2018 CLINICAL DATA:  Pain after fall EXAM: PELVIS - 1-2 VIEW COMPARISON:  None. FINDINGS: There is no evidence of pelvic fracture or diastasis. No pelvic bone lesions are seen. IMPRESSION: Negative. Electronically Signed   By: Dorise Bullion III M.D   On: 07/07/2018 19:48   Dg Shoulder Right  Result Date: 07/07/2018 CLINICAL DATA:  Pain after fall EXAM: RIGHT SHOULDER - 2+ VIEW COMPARISON:  None. FINDINGS: There is no evidence of fracture or dislocation. There is no evidence of arthropathy or other focal bone abnormality. Soft tissues are unremarkable. IMPRESSION: Negative. Electronically Signed   By: Dorise Bullion III M.D   On: 07/07/2018 19:49   Dg Elbow Complete Right  Result Date: 07/07/2018 CLINICAL DATA:  Pain after fall EXAM: RIGHT ELBOW - COMPLETE 3+ VIEW COMPARISON:  None. FINDINGS: There is no evidence of fracture, dislocation, or joint effusion. There is no evidence of arthropathy or other focal bone abnormality. Soft tissues are unremarkable. IMPRESSION: Negative. Electronically Signed   By: Dorise Bullion III M.D   On: 07/07/2018 19:47   Dg Forearm Right  Result Date: 07/07/2018 CLINICAL DATA:  Pain after trauma EXAM: RIGHT FOREARM - 2 VIEW COMPARISON:  None. FINDINGS: There is no evidence of fracture or other focal bone lesions. Soft tissues are unremarkable. IMPRESSION: Negative. Electronically Signed   By: Dorise Bullion III M.D   On: 07/07/2018 19:44   Ct Head Wo Contrast  Result Date: 07/07/2018 CLINICAL DATA:  Fall onto right side EXAM: CT HEAD WITHOUT CONTRAST CT CERVICAL SPINE WITHOUT CONTRAST TECHNIQUE: Multidetector CT imaging of the head and cervical spine was performed following the standard protocol without intravenous contrast. Multiplanar CT image reconstructions of the cervical spine were also generated. COMPARISON:  MRI brain dated 05/25/2017. MRI cervical spine dated 05/26/2017. FINDINGS: CT HEAD FINDINGS Brain: No evidence of acute  infarction, hemorrhage, hydrocephalus, extra-axial collection or mass lesion/mass effect. Subcortical white matter and periventricular small vessel ischemic changes. Vascular: No hyperdense vessel or unexpected calcification. Skull: Normal. Negative for fracture or focal lesion. Sinuses/Orbits: The visualized paranasal sinuses are essentially clear. The mastoid air cells are unopacified. Other: None. CT CERVICAL SPINE FINDINGS Alignment: Normal cervical lordosis. Skull base and vertebrae: No acute fracture. No  primary bone lesion or focal pathologic process. Soft tissues and spinal canal: No prevertebral fluid or swelling. No visible canal hematoma. Disc levels: Mild multilevel degenerative changes. Spinal canal is patent. Right C4-5 and C5-6 neural foraminal narrowing. Upper chest: Visualized lung apices are clear Other: Visualized thyroid is unremarkable. IMPRESSION: No evidence of acute intracranial abnormality. Small vessel ischemic changes. No evidence of traumatic injury to the cervical spine. Mild multilevel degenerative changes with right C4-5 and C5-6 neural foraminal narrowing. Electronically Signed   By: Julian Hy M.D.   On: 07/07/2018 19:20   Ct Cervical Spine Wo Contrast  Result Date: 07/07/2018 CLINICAL DATA:  Fall onto right side EXAM: CT HEAD WITHOUT CONTRAST CT CERVICAL SPINE WITHOUT CONTRAST TECHNIQUE: Multidetector CT imaging of the head and cervical spine was performed following the standard protocol without intravenous contrast. Multiplanar CT image reconstructions of the cervical spine were also generated. COMPARISON:  MRI brain dated 05/25/2017. MRI cervical spine dated 05/26/2017. FINDINGS: CT HEAD FINDINGS Brain: No evidence of acute infarction, hemorrhage, hydrocephalus, extra-axial collection or mass lesion/mass effect. Subcortical white matter and periventricular small vessel ischemic changes. Vascular: No hyperdense vessel or unexpected calcification. Skull: Normal. Negative  for fracture or focal lesion. Sinuses/Orbits: The visualized paranasal sinuses are essentially clear. The mastoid air cells are unopacified. Other: None. CT CERVICAL SPINE FINDINGS Alignment: Normal cervical lordosis. Skull base and vertebrae: No acute fracture. No primary bone lesion or focal pathologic process. Soft tissues and spinal canal: No prevertebral fluid or swelling. No visible canal hematoma. Disc levels: Mild multilevel degenerative changes. Spinal canal is patent. Right C4-5 and C5-6 neural foraminal narrowing. Upper chest: Visualized lung apices are clear Other: Visualized thyroid is unremarkable. IMPRESSION: No evidence of acute intracranial abnormality. Small vessel ischemic changes. No evidence of traumatic injury to the cervical spine. Mild multilevel degenerative changes with right C4-5 and C5-6 neural foraminal narrowing. Electronically Signed   By: Julian Hy M.D.   On: 07/07/2018 19:20   Dg Hand 2 View Right  Result Date: 07/07/2018 CLINICAL DATA:  Pain after fall EXAM: RIGHT HAND - 2 VIEW COMPARISON:  None. FINDINGS: There is no evidence of fracture or dislocation. There is no evidence of arthropathy or other focal bone abnormality. Soft tissues are unremarkable. IMPRESSION: Negative. Electronically Signed   By: Dorise Bullion III M.D   On: 07/07/2018 19:45   Dg Humerus Right  Result Date: 07/07/2018 CLINICAL DATA:  Pain after fall EXAM: RIGHT HUMERUS - 2+ VIEW COMPARISON:  None. FINDINGS: There is no evidence of fracture or other focal bone lesions. Soft tissues are unremarkable. IMPRESSION: Negative. Electronically Signed   By: Dorise Bullion III M.D   On: 07/07/2018 19:46   Dg Femur Min 2 Views Right  Result Date: 07/07/2018 CLINICAL DATA:  Pain after fall EXAM: RIGHT FEMUR 2 VIEWS COMPARISON:  None. FINDINGS: There is no evidence of fracture or other focal bone lesions. Soft tissues are unremarkable. IMPRESSION: Negative. Electronically Signed   By: Dorise Bullion  III M.D   On: 07/07/2018 19:48    Procedures Procedures (including critical care time)  Medications Ordered in ED Medications - No data to display   Initial Impression / Assessment and Plan / ED Course  I have reviewed the triage vital signs and the nursing notes.  Pertinent labs & imaging results that were available during my care of the patient were reviewed by me and considered in my medical decision making (see chart for details).  Clinical Course as of Jul 07 2356  Fri Jul 07, 2018  2256 I spoke with patient's brother.  He informs me that patient has declined over the past few weeks as she has had multiple falls and balance issues.  We will order face-to-face for home health.  Mr. Idolina Primer, patient's brother would like to be kept informed on home health, as he is essentially the primary contact for the patient. [phone] - Mr. Karmen Bongo who is patients brother.   [AN]    Clinical Course User Index [AN] Varney Biles, MD    72 year old female with PMH significant for MDD, BP 1, CKD stage III, who presents status post fall.  Patient states that roughly 2 hours prior to arrival she was attempting to sit in a chair when she "missed the chair."  History as above.  Patient is with mechanical fall with potential injuries.  Doubt strokelike symptoms due to inconsistent stories with inconsistent exam not tracking to no neurological distributions.  Labs and imaging performed.  Creatinine 1.19, near patient baseline of 1.15.  Imaging negative for acute fracture my alignment.  Patient without persistent neck tenderness, C-spine cleared by attending, Dr. Kathrynn Humble.  Brother at bedside states the patient has had progressive weakness and multiple falls at home.  Patient set up with home health and home health PT with social work evaluation.  Patient given follow-up with neurology.  Patient family state understanding agree with plan.  Please see Dr. Andree Elk notes for further  details.  Patient unable to ambulate on preparation for discharge.  Patient states knee pain and right ankle/foot/right lower leg.  X-rays of right lower extremity obtained.  Patient to bed overnight in the ED with PT OT and case management evaluation in the a.m.  Pharmacy consulted to perform med rec to ensure patient receives daily medications while waiting.  Patient care handed off.  Please see next provider note for ultimate disposition.  Final Clinical Impressions(s) / ED Diagnoses   Final diagnoses:  Fall, initial encounter  Ataxia  Cervical strain, acute, initial encounter    ED Discharge Orders    None       Keenan Bachelor, MD 07/07/18 2313    Varney Biles, MD 07/07/18 8309    Keenan Bachelor, MD 07/07/18 Edie, Ankit, MD 07/09/18 4076

## 2018-07-07 NOTE — ED Triage Notes (Signed)
Pt transported from home by EMS, pt fell onto R side while attempting to sit in chair @ 1400. Pt states she usually walks with a walker. Pt reports new tremor in R hand, neg stroke screen.Pt found sitting on walker when EMS arrived. No LOC, no thinners.  #18 L FA est. GCS 15

## 2018-07-07 NOTE — Discharge Instructions (Addendum)
We saw in the year after he had a fall. It seems like your balance is gotten worse over the past few days.  We are recommending that you follow-up with neurologist if you continue to have poor balance issues.  Additionally, the request of your family we are sending in home health.  Someone should contact you tomorrow in regards to that.

## 2018-07-07 NOTE — ED Notes (Signed)
RN attempted to discharge however family requested to speak to provider.  Informed family

## 2018-07-08 ENCOUNTER — Emergency Department (HOSPITAL_COMMUNITY): Payer: Medicare HMO

## 2018-07-08 DIAGNOSIS — R251 Tremor, unspecified: Secondary | ICD-10-CM | POA: Diagnosis not present

## 2018-07-08 DIAGNOSIS — S99921A Unspecified injury of right foot, initial encounter: Secondary | ICD-10-CM | POA: Diagnosis not present

## 2018-07-08 DIAGNOSIS — M79601 Pain in right arm: Secondary | ICD-10-CM | POA: Diagnosis not present

## 2018-07-08 DIAGNOSIS — S99911A Unspecified injury of right ankle, initial encounter: Secondary | ICD-10-CM | POA: Diagnosis not present

## 2018-07-08 DIAGNOSIS — S161XXA Strain of muscle, fascia and tendon at neck level, initial encounter: Secondary | ICD-10-CM | POA: Diagnosis not present

## 2018-07-08 DIAGNOSIS — M7918 Myalgia, other site: Secondary | ICD-10-CM | POA: Diagnosis not present

## 2018-07-08 DIAGNOSIS — S8991XA Unspecified injury of right lower leg, initial encounter: Secondary | ICD-10-CM | POA: Diagnosis not present

## 2018-07-08 DIAGNOSIS — M79604 Pain in right leg: Secondary | ICD-10-CM | POA: Diagnosis not present

## 2018-07-08 DIAGNOSIS — N183 Chronic kidney disease, stage 3 (moderate): Secondary | ICD-10-CM | POA: Diagnosis not present

## 2018-07-08 DIAGNOSIS — I1310 Hypertensive heart and chronic kidney disease without heart failure, with stage 1 through stage 4 chronic kidney disease, or unspecified chronic kidney disease: Secondary | ICD-10-CM | POA: Diagnosis not present

## 2018-07-08 DIAGNOSIS — W1839XA Other fall on same level, initial encounter: Secondary | ICD-10-CM | POA: Diagnosis not present

## 2018-07-08 DIAGNOSIS — M79661 Pain in right lower leg: Secondary | ICD-10-CM | POA: Diagnosis not present

## 2018-07-08 DIAGNOSIS — R531 Weakness: Secondary | ICD-10-CM | POA: Diagnosis not present

## 2018-07-08 DIAGNOSIS — R270 Ataxia, unspecified: Secondary | ICD-10-CM | POA: Diagnosis not present

## 2018-07-08 MED ORDER — MECLIZINE HCL 25 MG PO TABS: 25.0000 mg | ORAL_TABLET | Freq: Three times a day (TID) | ORAL | 0 refills | Status: DC | PRN

## 2018-07-08 MED ORDER — MECLIZINE HCL 25 MG PO TABS
25.0000 mg | ORAL_TABLET | Freq: Once | ORAL | Status: AC
  Administered 2018-07-08: 25 mg via ORAL
  Filled 2018-07-08: qty 1

## 2018-07-08 NOTE — Evaluation (Signed)
Physical Therapy Evaluation Patient Details Name: Judy [name] MRN: [id number] DOB: 10-16-1945 Today's Date: 07/08/2018   History of Present Illness  Pt is a 72 y.o. F with significant PMH of MDD, bipolar 1, CKD stage III, who presents s/p fall. States she was attempting to sit in a chair when she "missed teh chair." Had immediate onset of pain on right side. All imaging negative for acute fracture or abnormality.  Clinical Impression  Pt admitted with above diagnosis. Pt currently with functional limitations due to the deficits listed below (see PT Problem List). Prior to admission, patient lives alone in an apartment, is independent with ADL's, uses Rollator for mobility, and denies history of other falls in past 3 years. Currently presenting with decreased functional mobility secondary to RLE pain, gait abnormalities, and decreased gait speed. Ambulating 80 feet with Rollator and min guard assist.  No significant discrepancies noted between left and right lower extremities with manual muscle testing, although functionally, patient does display decreased right foot clearance during swing phase with ambulation.  Pt will benefit from skilled PT to increase their independence and safety with mobility to allow discharge to the venue listed below.       Follow Up Recommendations Home health PT;Supervision for mobility/OOB (refusing post acute rehab)    Equipment Recommendations  None recommended by PT    Recommendations for Other Services OT consult     Precautions / Restrictions Precautions Precautions: Fall Restrictions Weight Bearing Restrictions: No      Mobility  Bed Mobility Overal bed mobility: Modified Independent             General bed mobility comments: Increased time required  Transfers Overall transfer level: Needs assistance Equipment used: 4-wheeled walker Transfers: Sit to/from Stand Sit to Stand: Min guard         General transfer comment: Increased  time required to place both hands on walker with increased trunk flexion  Ambulation/Gait Ambulation/Gait assistance: Min guard Gait Distance (Feet): 80 Feet Assistive device: 4-wheeled walker Gait Pattern/deviations: Step-through pattern;Decreased stride length;Decreased step length - right Gait velocity: der Gait velocity interpretation: <1.31 ft/sec, indicative of household ambulator General Gait Details: Decreased right foot clearance with external rotation noted  Stairs            Wheelchair Mobility    Modified Rankin (Stroke Patients Only)       Balance Overall balance assessment: Needs assistance Sitting-balance support: No upper extremity supported;Feet supported Sitting balance-Leahy Scale: Good     Standing balance support: No upper extremity supported;During functional activity Standing balance-Leahy Scale: Fair                               Pertinent Vitals/Pain Pain Assessment: Faces Faces Pain Scale: Hurts little more Pain Location: right hip extending distally to lateral thigh Pain Descriptors / Indicators: Aching;Grimacing Pain Intervention(s): Monitored during session    Home Living Family/patient expects to be discharged to:: Private residence Living Arrangements: Alone Available Help at Discharge: Family;Available PRN/intermittently(granddaughter) Type of Home: Apartment         Home Equipment: Walker - 4 wheels;Shower seat      Prior Function Level of Independence: Independent with assistive device(s)         Comments: Drives, community ambulatory with Rollator, has Meals on Wheels     Hand Dominance        Extremity/Trunk Assessment   Upper Extremity Assessment Upper Extremity Assessment: Overall [facility]  for tasks assessed    Lower Extremity Assessment Lower Extremity Assessment: RLE deficits/detail;LLE deficits/detail RLE Deficits / Details: Grossly WFL except hip flexion 4/5 RLE Sensation: WNL LLE Deficits /  Details: WFL LLE Sensation: WNL    Cervical / Trunk Assessment Cervical / Trunk Assessment: Kyphotic  Communication   Communication: HOH  Cognition Arousal/Alertness: Awake/alert Behavior During Therapy: WFL for tasks assessed/performed Overall Cognitive Status: Within Functional Limits for tasks assessed                                 General Comments: Slow processing noted but could be secondary to [place] Comments      Exercises     Assessment/Plan    PT Assessment Patient needs continued PT services  PT Problem List Decreased strength;Decreased activity tolerance;Decreased balance;Decreased mobility;Pain       PT Treatment Interventions DME instruction;Gait training;Functional mobility training;Therapeutic activities;Therapeutic exercise;Balance training;Patient/family education    PT Goals (Current goals can be found in the Care Plan section)  Acute Rehab PT Goals Patient Stated Goal: "go home." PT Goal Formulation: With patient Time For Goal Achievement: 07/22/18 Potential to Achieve Goals: Good    Frequency Min 3X/week   Barriers to discharge Decreased caregiver support      Co-evaluation               AM-PAC PT "6 Clicks" Daily Activity  Outcome Measure Difficulty turning over in bed (including adjusting bedclothes, sheets and blankets)?: None Difficulty moving from lying on back to sitting on the side of the bed? : A Little Difficulty sitting down on and standing up from a chair with arms (e.g., wheelchair, bedside commode, etc,.)?: A Little Help needed moving to and from a bed to chair (including a wheelchair)?: A Little Help needed walking in hospital room?: A Little Help needed climbing 3-5 steps with a railing? : A Lot 6 Click Score: 18    End of Session Equipment Utilized During Treatment: Gait belt Activity Tolerance: Patient tolerated treatment well Patient left: in bed;with call bell/phone within reach Nurse  Communication: Mobility status PT Visit Diagnosis: Unsteadiness on feet (R26.81);Other abnormalities of gait and mobility (R26.89);History of falling (Z91.81);Difficulty in walking, not elsewhere classified (R26.2)    Time: 2683-4196 PT Time Calculation (min) (ACUTE ONLY): 20 min   Charges:   PT Evaluation $PT Eval Moderate Complexity: 1 Mod          Ellamae Sia, [place], DPT Acute Rehabilitation Services Pager [phone] Office [phone]  Willy Eddy 07/08/2018, 11:03 AM

## 2018-07-08 NOTE — ED Notes (Signed)
t 98.6

## 2018-07-08 NOTE — ED Notes (Signed)
Physical Therapy at bedside.

## 2018-07-08 NOTE — ED Notes (Signed)
Patient verbalizes understanding of discharge instructions. Opportunity for questioning and answers were provided. Armband removed by staff, pt discharged from ED with family.  

## 2018-07-08 NOTE — ED Notes (Signed)
Pt moved to a hospital bed for comfort  Alert.   purwick placed

## 2018-07-08 NOTE — ED Notes (Signed)
Pt attempting to call ride -- no answer-

## 2018-07-08 NOTE — Care Management (Deleted)
Consult received on patient.  Awaiting PT/OT evals for equipment/therapy recommendations.

## 2018-07-08 NOTE — Care Management Note (Addendum)
Case Management Note  Patient Details  Name: Judy [name] MRN: [id number] Date of Birth: [birth year]  Subjective/Objective:      Pt presents for fall.  From home alone.  Pt states she had vertigo last night and was not able to ambulate.  Pt has improved with meclizine and walke 80 ft with PT and walker.  Pt has rollator at home.  Pt has a granddaughter, brother, and sister-in-law who are able to provide support intermittantly.  Pt aware that it is recommended that she have 24 hour supervision, but does not want SNF.  Pt usually drives self but states she can ask brother to drive her until she is improved.    Pt has MD appt with PCP on Monday.   Pt has rollator, cane, 3n1 at home.          Action/Plan: Pt agrees to [facility] services and has no preference of agency.  Agreeable to [facility]. Referral called to [facility] with [facility].    Referral to [facility] for support.  Expected Discharge Date:                  Expected Discharge Plan:  [place]  In-House Referral:  [facility]  Discharge planning Services  CM Consult  Post Acute Care Choice:  NA Choice offered to:  Patient  DME Arranged:  N/A DME Agency:  NA  HH Arranged:  RN, PT, OT, Nurse's Aide, Social Work [employer] Agency:  [place]  Status of Service:  Completed, signed off  If discussed at [employer], dates discussed:    Additional Comments:  Claudie Leach, RN 07/08/2018, 11:22 AM

## 2018-07-08 NOTE — ED Provider Notes (Signed)
Pt seen and evaluated by PT/OT/CM. Deemed safe for discharge with a prescription for Jacqlyn Krauss, MD 07/08/18 1126

## 2018-07-10 ENCOUNTER — Telehealth: Payer: Self-pay

## 2018-07-10 ENCOUNTER — Other Ambulatory Visit: Payer: Self-pay | Admitting: Nurse Practitioner

## 2018-07-10 DIAGNOSIS — F329 Major depressive disorder, single episode, unspecified: Secondary | ICD-10-CM | POA: Diagnosis not present

## 2018-07-10 DIAGNOSIS — N189 Chronic kidney disease, unspecified: Secondary | ICD-10-CM | POA: Diagnosis not present

## 2018-07-10 DIAGNOSIS — R634 Abnormal weight loss: Secondary | ICD-10-CM | POA: Diagnosis not present

## 2018-07-10 DIAGNOSIS — F319 Bipolar disorder, unspecified: Secondary | ICD-10-CM | POA: Diagnosis not present

## 2018-07-10 DIAGNOSIS — R7309 Other abnormal glucose: Secondary | ICD-10-CM | POA: Diagnosis not present

## 2018-07-10 DIAGNOSIS — R69 Illness, unspecified: Secondary | ICD-10-CM | POA: Diagnosis not present

## 2018-07-10 DIAGNOSIS — I10 Essential (primary) hypertension: Secondary | ICD-10-CM | POA: Diagnosis not present

## 2018-07-10 NOTE — Telephone Encounter (Signed)
I have made the 1st attempt to contact the patient or family member in charge, in order to follow up from recently being discharged from the hospital. I left a message on voicemail but I will make another attempt at a different time.  

## 2018-07-10 NOTE — Telephone Encounter (Signed)
I have made the 2nd attempt to contact the patient or family member in charge, in order to follow up from recently being discharged from the hospital. I left a message on voicemail but I will make another attempt at a different time.  

## 2018-07-11 ENCOUNTER — Other Ambulatory Visit: Payer: Self-pay | Admitting: *Deleted

## 2018-07-11 ENCOUNTER — Encounter: Payer: Self-pay | Admitting: *Deleted

## 2018-07-11 LAB — COMPREHENSIVE METABOLIC PANEL
A/G RATIO: 1.6 (ref 1.2–2.2)
ALBUMIN: 4.2 g/dL (ref 3.5–4.8)
ALT: 34 IU/L — ABNORMAL HIGH (ref 0–32)
AST: 31 IU/L (ref 0–40)
Alkaline Phosphatase: 77 IU/L (ref 39–117)
BUN / CREAT RATIO: 12 (ref 12–28)
BUN: 14 mg/dL (ref 8–27)
Bilirubin Total: 0.4 mg/dL (ref 0.0–1.2)
CALCIUM: 9.8 mg/dL (ref 8.7–10.3)
CO2: 21 mmol/L (ref 20–29)
CREATININE: 1.18 mg/dL — AB (ref 0.57–1.00)
Chloride: 101 mmol/L (ref 96–106)
GFR, EST AFRICAN AMERICAN: 54 mL/min/{1.73_m2} — AB (ref >59)
GFR, EST NON AFRICAN AMERICAN: 47 mL/min/{1.73_m2} — AB (ref >59)
GLOBULIN, TOTAL: 2.7 g/dL (ref 1.5–4.5)
Glucose: 80 mg/dL (ref 65–99)
POTASSIUM: 3.8 mmol/L (ref 3.5–5.2)
SODIUM: 139 mmol/L (ref 134–144)
Total Protein: 6.9 g/dL (ref 6.0–8.5)

## 2018-07-11 LAB — CBC WITH DIFFERENTIAL/PLATELET
BASOS: 0 %
Basophils Absolute: 0 10*3/uL (ref 0.0–0.2)
EOS (ABSOLUTE): 0 10*3/uL (ref 0.0–0.4)
EOS: 0 %
HEMATOCRIT: 40.3 % (ref 34.0–46.6)
HEMOGLOBIN: 13.2 g/dL (ref 11.1–15.9)
IMMATURE GRANULOCYTES: 0 %
Immature Grans (Abs): 0 10*3/uL (ref 0.0–0.1)
LYMPHS ABS: 1.5 10*3/uL (ref 0.7–3.1)
Lymphs: 26 %
MCH: 29.8 pg (ref 26.6–33.0)
MCHC: 32.8 g/dL (ref 31.5–35.7)
MCV: 91 fL (ref 79–97)
MONOCYTES: 8 %
MONOS ABS: 0.5 10*3/uL (ref 0.1–0.9)
NEUTROS PCT: 66 %
Neutrophils Absolute: 3.9 10*3/uL (ref 1.4–7.0)
Platelets: 335 10*3/uL (ref 150–450)
RBC: 4.43 x10E6/uL (ref 3.77–5.28)
RDW: 14 % (ref 12.3–15.4)
WBC: 5.9 10*3/uL (ref 3.4–10.8)

## 2018-07-11 LAB — LIPID PANEL WITH LDL/HDL RATIO
Cholesterol, Total: 210 mg/dL — ABNORMAL HIGH (ref 100–199)
HDL: 76 mg/dL (ref >39)
LDL CALC: 114 mg/dL — AB (ref 0–99)
LDl/HDL Ratio: 1.5 ratio (ref 0.0–3.2)
TRIGLYCERIDES: 99 mg/dL (ref 0–149)
VLDL Cholesterol Cal: 20 mg/dL (ref 5–40)

## 2018-07-11 LAB — HGB A1C W/O EAG: HEMOGLOBIN A1C: 5.6 % (ref 4.8–5.6)

## 2018-07-11 LAB — TSH: TSH: 1.63 u[IU]/mL (ref 0.450–4.500)

## 2018-07-11 NOTE — Patient Outreach (Addendum)
[facility]) Care Management  07/11/2018  ALPHA CHOUINARD [birth year] [id number]    Referral received 9/30 post ED visit for Fall  RN spoke with pt today and introduced the [facility] program and available services. Pt verified her identifiers and discussed her needs. Pt receptive to community case management related to her recent fall and indicated she "loss her balance" on the last falls with no injuries. Pt indicated she would like to have a aide to assist however reports she does not wish to pay for this services. Pt has reorots se make over $1100 a month but again does not wish to pay for this services. RN offered a list of agencies in the area however informed pt that based upon her income she maybe asked to pay for her requested services. Pt opt to decline resources related to her request. Pt states she currently has mobile meals delivering daily meals but has been losing weight due to her CKD but does not take supplements with her ongoing weigh loss of 12 lbs over the last 3 months weight in at 150 lbs at this time. Pt states she is pending appointments to find out why she is losing weight with her CKD.  Pt reports she currently lives in an assisted living facility. Based upon her requested needs RN discussed a higher level of care but pt does not wish to consider this option at this time. Pt also indicated she needs new glasses with an eye exam. RN offered to contact her provider to request a referral to an eye provider. This was completed with Dr. [name] Moore's office with Internal Medicine.  RN called and left voice message via provider's nurse to follow with pt or this RN for the requested referral. Other information discussed related to an [employer] information packet. Will discussed and requested CMA to mail A.D packet to pt for review on the initial home visit next week. Will encouraged pt to review this information with her son for future preparation on her wishes.   RN  able to complete the initial assessment at this time and inquired on information for a A.D. (receptive) and EMMI printed material for fall prevention. Offered a home visit with a plan of care discussed related to the above issues and needs for fall prevention, social work consult for transportation and pending appointment for an eye provider. Will scheduled the initial home visit for next week based upon pt's available request for a day and time convenient for pt. No additional issues to address at this time. Case will be active for [facility] services and provider will be notified.   Patient was recently discharged from hospital and all medications have been reviewed.  [facility] CM Care Plan Problem One     Most Recent Value  Care Plan Problem One  Fall prevention related to recent fall and history of falls  Role Documenting the Problem One  Care Management [place] for Problem One  Active  THN Long Term Goal   Pt will not have any falls over the enxt 90 days.  THN Long Term Goal Start Date  07/11/18  Interventions for Problem One Long Term Goal  Discussed fall preventions measures and use of DME both inside and outside the home. Will discuss the risk involved if non adherent to use of such safety measures discussed today.    [facility] CM Care Plan Problem Two     Most Recent Value  Care Plan Problem Two  Adherence  to medical appointments  Role Documenting the Problem Two  Care Management Coordinator  Care Plan for Problem Two  Active  THN CM Short Term Goal #1   Pt will be receptive to the requested appointment for an eye referral from her PCP over the next 30 days  THN CM Short Term Goal #1 Start Date  07/11/18  Interventions for Short Term Goal #2   Discussed the importance of the pending eye appointment for corrective vision in order to prevent risk of falls . Strongly encouraged pt to follow up wit the requested consult for an opthalmologist to attend the suggested appointment date and time.      [facility] CM Care Plan Problem Three     Most Recent Value  Care Plan Problem Three  Social Work consult for transportation resources  Role Documenting the Problem Three  Care Management [place] for Problem Three  Active  THN CM Short Term Goal #1   Pt will be receptive to the social worker consult for transportation resources over the next 3 weeks  THN CM Short Term Goal #1 Start Date  07/11/18  Interventions for Short Term Goal #1  Will make a referral for a social worker to assist pt with transportation resources in order for pt to get to all medical appointments. Will stress the importance of pt to follow up accordingly with available sources to assist.       Raina Mina, RN Care Management Coordinator [place] Office [phone]

## 2018-07-14 DIAGNOSIS — M79604 Pain in right leg: Secondary | ICD-10-CM | POA: Diagnosis not present

## 2018-07-14 DIAGNOSIS — W19XXXD Unspecified fall, subsequent encounter: Secondary | ICD-10-CM | POA: Diagnosis not present

## 2018-07-17 ENCOUNTER — Other Ambulatory Visit: Payer: Self-pay

## 2018-07-17 ENCOUNTER — Telehealth: Payer: Self-pay | Admitting: Nurse Practitioner

## 2018-07-17 DIAGNOSIS — W19XXXD Unspecified fall, subsequent encounter: Secondary | ICD-10-CM | POA: Diagnosis not present

## 2018-07-17 DIAGNOSIS — M79604 Pain in right leg: Secondary | ICD-10-CM | POA: Diagnosis not present

## 2018-07-17 NOTE — Telephone Encounter (Signed)
Ok to have therapy as requested, also inquired about the patient getting a CT scan.  Left vm to return call with more information

## 2018-07-17 NOTE — Patient Outreach (Signed)
[facility]) Care Management  07/17/2018  SKY PRIMO 1946-05-26 [id number]  Successful outreach to the patient on today's date, HIPAA identifiers confirmed. BSW introduced self to the patient and the reason for today's call, indicating the patient had been referred for transportation assistance. The patient has recently had a fall injuring her right leg. The patient reports prior to this injury she was independent in driving self. The patient is currently working with home health therapy and hopes to return to driving.  BSW to arrange transportation to the patients internal medicine appointment on October 14 via [place]. BSW spoke with the patient about other transportation options such as Conservation officer, nature. The patient is agreeable to this BSW assisting with a SCAT application. BSW to complete a SCAT application and submit on the patients behalf. The patient has asked this BSW to contact her closer to the appointment date to remind of arranged transportation as well as provide a pick-up time. BSW will plan to contact patient on October 11 as requested by the patient.  Daneen Schick, BSW, CDP [facility] [phone]

## 2018-07-19 ENCOUNTER — Telehealth: Payer: Self-pay | Admitting: Nurse Practitioner

## 2018-07-19 NOTE — Telephone Encounter (Signed)
Spoke with Judy [name] at [facility] giving an order for nursing care. Also the patient had a CT scan of the chest ordered by the ED on 07/07/18 after she had suffered a fall.

## 2018-07-20 ENCOUNTER — Other Ambulatory Visit: Payer: Self-pay | Admitting: *Deleted

## 2018-07-20 ENCOUNTER — Other Ambulatory Visit: Payer: Self-pay

## 2018-07-20 NOTE — Patient Outreach (Signed)
[facility]) Care Management  07/20/2018  Judy [name] [birth year] [id number]  Incoming call from [place] stating the patient is currently active with [place] and has a Education officer, museum Kenney Houseman) assigned to her. Kenney Houseman is reportedly working on a SCAT application for the patient. BSW will perform a discipline closure as the patient is already being assisted by a Education officer, museum. [facility] RNCM reminded the patient of her transportation arranged on 10/14.  Daneen Schick, BSW, CDP [facility] [phone]

## 2018-07-20 NOTE — Patient Outreach (Signed)
[facility]) Care Management   07/20/2018  Judy [name] [birth year] [id number]  Judy [name] is an 72 y.o. female  Subjective:  FALLS: Pt denies any additional falls or injuries since last conversation as pt states she is using all her assisted devices as recommended since 10/1. Pt states she does not have any items or obstacles in the pathways of her home. State all area are lighted well and nothing is on the top shelves of her cabinets. Pt states she uses her rollator both inside and outside the home.  CONSULT F/U (EYE): Pt states she has not been contacted for an eye appointment at this time but will inquire on Monday on her provider's visit.  SW CONSULT: States [facility] social worker contact her but only remember's transportation services has been arranged for an office visit on Monday. COMMUNITY RESOURCES: Pt reports involvement with [facility] for PT and SW.  Objective:   Review of Systems  Constitutional: Negative.   HENT: Negative.   Eyes: Negative.   Respiratory: Negative.   Cardiovascular: Negative.   Gastrointestinal: Negative.   Genitourinary:       A little burning upon urination  Musculoskeletal: Negative.   Skin: Negative.   Neurological: Negative.   Endo/Heme/Allergies: Negative.   Psychiatric/Behavioral: Negative.     Physical Exam  Constitutional: She is oriented to person, place, and time. She appears well-developed and well-nourished.  HENT:  Right Ear: External ear normal.  Left Ear: External ear normal.  Eyes: EOM are normal.  Neck: Normal range of motion.  Cardiovascular: Normal heart sounds.  Respiratory: Effort normal and breath sounds normal.  GI: Soft. Bowel sounds are normal.  Musculoskeletal: Normal range of motion.  Neurological: She is alert and oriented to person, place, and time.  Skin: Skin is warm and dry.  Psychiatric: She has a normal mood and affect. Her behavior is normal. Judgment and thought content normal.     Encounter Medications:   Outpatient Encounter Medications as of 07/20/2018  Medication Sig  . acetaminophen (TYLENOL) 500 MG tablet Take 500 mg by mouth daily as needed for moderate pain.  [name] amLODipine (NORVASC) 5 MG tablet TAKE ONE TABLET BY MOUTH EVERY DAY  . buPROPion (WELLBUTRIN XL) 150 MG 24 hr tablet Take 150 mg by mouth every morning. Take one tablet every morning  . FLUoxetine HCl 60 MG TABS Take 60 mg by mouth every morning.  [name] LATUDA 120 MG TABS Take 120 mg by mouth at bedtime. With 350 calories  . meclizine (ANTIVERT) 25 MG tablet Take 1 tablet (25 mg total) by mouth 3 (three) times daily as needed for dizziness.  . linaclotide (LINZESS) 145 MCG CAPS capsule Take 145 mcg by mouth daily as needed (constipation).   No facility-administered encounter medications on file as of 07/20/2018.     Functional Status:   In your present state of health, do you have any difficulty performing the following activities: 07/11/2018  Hearing? N  Vision? Y  Difficulty concentrating or making decisions? N  Walking or climbing stairs? Y  Dressing or bathing? N  Doing errands, shopping? Y  Preparing Food and eating ? N  Using the Toilet? N  In the past six months, have you accidently leaked urine? N  Do you have problems with loss of bowel control? N  Managing your Medications? N  Managing your Finances? N  Housekeeping or managing your Housekeeping? N  Some recent data might be hidden    Fall/Depression Screening:  Fall Risk  07/11/2018 05/24/2018 03/21/2018  Falls in the past year? Yes No Yes  Number falls in past yr: - - 2 or more  Injury with Fall? No - Yes  Comment - - Right leg injured   Risk for fall due to : Impaired balance/gait - -   PHQ 2/9 Scores 07/11/2018 07/06/2017 03/25/2017 06/04/2014  PHQ - 2 Score '4 4 1 2  '$ PHQ- 9 Score 6 11 - 3   BP 122/78 (BP Location: Right Arm, Patient Position: Sitting, Cuff Size: Normal)   Pulse 65   Resp 20   Ht 1.778 m ('5\' 10"'$ )   Wt 160  lb (72.6 kg)   SpO2 96%   BMI 22.96 kg/m  Assessment:   Obtain a signed consent for [facility] services Ongoing case management services related fall prevention Follow up on the requested eye appointment (pending) Follow up with [facility] social worker and [place] social worker Follow up on needed community resources  Plan:  Will obtain a signed consent via [facility] services Will reiterate on safety measures in the home and provide printed EMMI material that will be reviewed and discussed for the following: Preventing Falls, Preventing Falls in Older Adults and Standing Balance Exercises (beginner). Will strongly encouraged pt to use all available DME to prevent fall. Will discuss the risk and strongly encouraged adherence. Will offer to contact pt's provider and inquired on the requested eye appointment however pt indicated she would inquired on her upcoming appointment on Monday 10/14 at her primary provider's office.  Will continue both [facility] social worker and [place] worker. [facility] SW is currently working on Publishing rights manager for this pt along with obtaining an aide while [place] PT is in the home. RN will request [facility] social worker to re-frame from obtaining the same services. No additional request at this time that has not been addressed from this pt.  Will verify community resources are in process with SCAT and provider additional information on pt's request for personal long term care as pt aware she may have to pay out of pocket for this care (pt verbalized an understanding).  [facility] CM Care Plan Problem One     Most Recent Value  Care Plan Problem One  Fall prevention related to recent fall and history of falls  Role Documenting the Problem One  Care Management [place] for Problem One  Active  THN Long Term Goal   Pt will not have any falls over the enxt 90 days.  THN Long Term Goal Start Date  07/11/18  Interventions for Problem One Long Term Goal  Will discussed safety  measures and continue to stress fall prevention with use of DME in and outside the home.     THN CM Care Plan Problem Two     Most Recent Value  Care Plan Problem Two  Adherence to medical appointments  Role Documenting the Problem Two  Care Management Coordinator  Care Plan for Problem Two  Active  THN CM Short Term Goal #1   Pt will be receptive to the requested appointment for an eye referral from her PCP over the next 30 days  THN CM Short Term Goal #1 Start Date  07/11/18  Interventions for Short Term Goal #2   Currently pt stil pending appointment for eye provider however will receive [employer] services for transportation to her PRP on 10/14, Note SCAT services pending with involved social worker with [employer] agency.    [facility] CM Care  Plan Problem Three     Most Recent Value  Care Plan Problem Three  Social Work consult for transportation resources  Role Documenting the Problem Three  Care Management [place] for Problem Three  Not Active  THN CM Short Term Goal #1   Pt will be receptive to the social worker consult for transportation resources over the next 3 weeks  THN CM Short Term Goal #1 Start Date  07/11/18  [facility] CM Short Term Goal #1 Met Date  07/20/18  Interventions for Short Term Goal #1  [place] working with pt for SCAT application spoke with [place]. THN no longer involved due to another services      Raina Mina, RN Care Management Coordinator [place] Office [phone]

## 2018-07-21 ENCOUNTER — Ambulatory Visit: Payer: Self-pay

## 2018-07-21 DIAGNOSIS — W19XXXD Unspecified fall, subsequent encounter: Secondary | ICD-10-CM | POA: Diagnosis not present

## 2018-07-21 DIAGNOSIS — M79604 Pain in right leg: Secondary | ICD-10-CM | POA: Diagnosis not present

## 2018-07-24 ENCOUNTER — Encounter: Payer: Self-pay | Admitting: Nurse Practitioner

## 2018-07-24 ENCOUNTER — Ambulatory Visit (INDEPENDENT_AMBULATORY_CARE_PROVIDER_SITE_OTHER): Payer: Medicare HMO | Admitting: Nurse Practitioner

## 2018-07-24 VITALS — BP 122/80 | HR 62 | Temp 98.5°F | Ht 70.0 in | Wt 150.6 lb

## 2018-07-24 DIAGNOSIS — F411 Generalized anxiety disorder: Secondary | ICD-10-CM

## 2018-07-24 DIAGNOSIS — K5900 Constipation, unspecified: Secondary | ICD-10-CM | POA: Diagnosis not present

## 2018-07-24 DIAGNOSIS — R918 Other nonspecific abnormal finding of lung field: Secondary | ICD-10-CM

## 2018-07-24 DIAGNOSIS — R634 Abnormal weight loss: Secondary | ICD-10-CM | POA: Diagnosis not present

## 2018-07-24 DIAGNOSIS — Z23 Encounter for immunization: Secondary | ICD-10-CM

## 2018-07-24 DIAGNOSIS — R300 Dysuria: Secondary | ICD-10-CM | POA: Diagnosis not present

## 2018-07-24 DIAGNOSIS — H538 Other visual disturbances: Secondary | ICD-10-CM

## 2018-07-24 LAB — POCT URINALYSIS DIPSTICK
Blood, UA: NEGATIVE
GLUCOSE UA: NEGATIVE
Ketones, UA: 40
LEUKOCYTES UA: NEGATIVE
NITRITE UA: NEGATIVE
PH UA: 6 (ref 5.0–8.0)
PROTEIN UA: NEGATIVE
SPEC GRAV UA: 1.02 (ref 1.010–1.025)
UROBILINOGEN UA: 1 U/dL

## 2018-07-24 MED ORDER — LINACLOTIDE 145 MCG PO CAPS: 145.0000 ug | ORAL_CAPSULE | Freq: Every day | ORAL | 1 refills | Status: DC | PRN

## 2018-07-24 NOTE — Progress Notes (Signed)
Subjective:     Patient ID: Judy [name] , female    DOB: 1946/06/28 , 72 y.o.   MRN: [id number]   Continues to lose weight, she has not gone for her CT scan at this time. She reports she has been eating the same since being seen previously.  THN is visiting the patient now Raina Mina RN.    Episodes of blurred vision - wears reading glasses.     Past Medical History:  Diagnosis Date  . Allergic rhinitis   . Anemia   . Anxiety   . Arthritis   . Bipolar 1 disorder ([place])   . Depression   . Diverticulosis of colon (without mention of hemorrhage) 08/25/2011   [name]  . 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 Hyperlipidemia   . Hypertension   . Obesity   . Renal disorder   . Vertigo       Current Outpatient Medications:  .  acetaminophen (TYLENOL) 500 MG tablet, Take 500 mg by mouth daily as needed for moderate pain., Disp: , Rfl:  .  amLODipine (NORVASC) 5 MG tablet, TAKE ONE TABLET BY MOUTH EVERY DAY, Disp: 30 tablet, Rfl: 11 .  buPROPion (WELLBUTRIN XL) 150 MG 24 hr tablet, Take 150 mg by mouth every morning. Take one tablet every morning, Disp: , Rfl: 1 .  FLUoxetine HCl 60 MG TABS, Take 60 mg by mouth every morning., Disp: , Rfl:  .  LATUDA 120 MG TABS, Take 120 mg by mouth at bedtime. With 350 calories, Disp: , Rfl:  .  linaclotide (LINZESS) 145 MCG CAPS capsule, Take 145 mcg by mouth daily as needed (constipation)., Disp: , Rfl:  .  meclizine (ANTIVERT) 25 MG tablet, Take 1 tablet (25 mg total) by mouth 3 (three) times daily as needed for dizziness., Disp: 15 tablet, Rfl: 0   No Known Allergies   Review of Systems  Constitutional: Negative.   Respiratory: Negative.   Cardiovascular: Negative.   Genitourinary: Positive for dysuria.  Musculoskeletal: Negative.   Neurological: Positive for tremors.  Psychiatric/Behavioral: Negative for agitation, behavioral problems and decreased concentration.     Today's Vitals   07/24/18 1422  BP: 122/80  Pulse: 62   Temp: 98.5 F (36.9 C)  SpO2: 98%  Weight: 150 lb 9.6 oz (68.3 kg)  Height: 5\' 10"  (1.778 m)  PainSc: 0-No pain   Body mass index is 21.61 kg/m.   Objective:  Physical Exam  Constitutional: She is oriented to person, place, and time. She appears well-developed and well-nourished.  Cardiovascular: Normal rate, regular rhythm, normal heart sounds and intact distal pulses.  Pulmonary/Chest: Effort normal and breath sounds normal.  Neurological: She is alert and oriented to person, place, and time.  Skin: Skin is warm and dry.  Psychiatric: She has a normal mood and affect. Her behavior is normal. Judgment and thought content normal.  She has twitching noted and some lip smacking.      Assessment And Plan:     1. Abnormal weight loss  Progressive, she has lost another 5 lbs since her last visit.   Pending her CT scan of the lungs to evaluate for abnormalities  Metabolic labs were within normal range.   2. Pulmonary nodules  She missed her appt for the CT scan of her lungs, I have called [facility] to get her rescheduled on Mon Oct 21st at 945am.  Verbalized understanding she needs to make this appt.  3. Dysuria  Urinalysis done, negative   Encouraged to stay well hydrated with water.  - POCT Urinalysis Dipstick (81002)  4. Blurred vision, bilateral  Intermittent episodes of blurred vision  Wears reading glasses - Ambulatory referral to Ophthalmology  5. Need for influenza vaccination Influenza vaccine administered Encouraged to take Tylenol as needed for fever or muscle aches. -  Flu vaccine HIGH DOSE PF (Fluzone High dose)  6. Constipation, unspecified constipation type  Chronic, doing well with Linzess sent refill - linaclotide (LINZESS) 145 MCG CAPS capsule; Take 1 capsule (145 mcg total) by mouth daily as needed (constipation).  Dispense: 90 capsule; Refill: [place], FNP

## 2018-07-24 NOTE — Patient Instructions (Signed)

## 2018-07-26 ENCOUNTER — Other Ambulatory Visit: Payer: Self-pay | Admitting: Internal Medicine

## 2018-07-26 ENCOUNTER — Telehealth: Payer: Self-pay | Admitting: *Deleted

## 2018-07-26 DIAGNOSIS — M79604 Pain in right leg: Secondary | ICD-10-CM | POA: Diagnosis not present

## 2018-07-26 DIAGNOSIS — R918 Other nonspecific abnormal finding of lung field: Secondary | ICD-10-CM

## 2018-07-26 DIAGNOSIS — W19XXXD Unspecified fall, subsequent encounter: Secondary | ICD-10-CM | POA: Diagnosis not present

## 2018-07-26 NOTE — Telephone Encounter (Signed)
Averal with [facility] called and stated that patient is scheduled for Monday for a CT low dose nodule follow up CT, but patient never actually had the initial low dose lung cancer screening CT so is not a candidate for that particular Diagnotic test. Need to change to a CT Chest Without.   I reviewed patient chart and it looks like we do not see patient anymore. It looks like her PCP is  [name], FNP at [place].  We have not seen patient since 05/24/18. Averal with [facility] stated that we placed the referral. Please Advise.

## 2018-07-26 NOTE — Telephone Encounter (Signed)
I called [facility] back and informed them that patient is no longer under our care at [place].  The order was placed today by a Tollie Pizza at 10:26 am. I am not aware of whom this person is and why she placed order under Dr.Carter's name.    [facility] plans to follow-up on this order with patient and other parties involved

## 2018-07-26 NOTE — Telephone Encounter (Signed)
Noted  

## 2018-07-27 ENCOUNTER — Telehealth: Payer: Self-pay

## 2018-07-27 NOTE — Telephone Encounter (Signed)
Patient called to see when her CT scan was I advised her it is on 07/31/18 at 9:45am.

## 2018-07-28 ENCOUNTER — Telehealth: Payer: Self-pay

## 2018-07-28 NOTE — Telephone Encounter (Signed)
Patient notified that Laurance Flatten, NP said that she needed to go to the ER for evaluation of diminished lung sounds.

## 2018-07-28 NOTE — Telephone Encounter (Signed)
Donita from [place] called wanting verbal orders for pt 2 times a week for 3 weeks to help pt to get stronger.   I returned her call and left a v/m giving her the ok for the verbal orders. YRL,RMA

## 2018-07-31 ENCOUNTER — Ambulatory Visit: Payer: Medicare HMO

## 2018-07-31 ENCOUNTER — Inpatient Hospital Stay: Admission: RE | Admit: 2018-07-31 | Payer: Medicare HMO | Source: Ambulatory Visit

## 2018-08-02 ENCOUNTER — Ambulatory Visit: Payer: Self-pay | Admitting: Physician Assistant

## 2018-08-02 ENCOUNTER — Telehealth: Payer: Self-pay | Admitting: Nurse Practitioner

## 2018-08-02 ENCOUNTER — Other Ambulatory Visit: Payer: Self-pay | Admitting: Nurse Practitioner

## 2018-08-02 DIAGNOSIS — R918 Other nonspecific abnormal finding of lung field: Secondary | ICD-10-CM

## 2018-08-02 DIAGNOSIS — M79604 Pain in right leg: Secondary | ICD-10-CM | POA: Diagnosis not present

## 2018-08-02 DIAGNOSIS — W19XXXD Unspecified fall, subsequent encounter: Secondary | ICD-10-CM | POA: Diagnosis not present

## 2018-08-02 NOTE — Telephone Encounter (Signed)
Called to change the ordering provider for her CT scan of lungs without contrast, scheduled for 10/25 at 330pm for a 350pm appt.  I also called the patient to make her aware she is to go to   [facility].

## 2018-08-04 ENCOUNTER — Ambulatory Visit
Admission: RE | Admit: 2018-08-04 | Discharge: 2018-08-04 | Disposition: A | Discharge status: Home / Self Care | Payer: Medicare HMO | Source: Ambulatory Visit | Attending: Nurse Practitioner | Admitting: Nurse Practitioner

## 2018-08-04 DIAGNOSIS — R0602 Shortness of breath: Secondary | ICD-10-CM | POA: Diagnosis not present

## 2018-08-04 DIAGNOSIS — R918 Other nonspecific abnormal finding of lung field: Secondary | ICD-10-CM

## 2018-08-07 ENCOUNTER — Other Ambulatory Visit: Payer: Self-pay | Admitting: Nurse Practitioner

## 2018-08-07 ENCOUNTER — Telehealth: Payer: Self-pay | Admitting: Nurse Practitioner

## 2018-08-07 ENCOUNTER — Other Ambulatory Visit: Payer: Self-pay | Admitting: Internal Medicine

## 2018-08-07 DIAGNOSIS — R634 Abnormal weight loss: Secondary | ICD-10-CM

## 2018-08-07 DIAGNOSIS — Z1211 Encounter for screening for malignant neoplasm of colon: Secondary | ICD-10-CM

## 2018-08-07 NOTE — Telephone Encounter (Signed)
Called and given results of CT scan showing 7 mm nodule to right upper lobe, recommend to have a repeat in 6-12 months. I will refer her for GI consult, she denies having a colonoscopy in the last 10 years

## 2018-08-10 DIAGNOSIS — W19XXXD Unspecified fall, subsequent encounter: Secondary | ICD-10-CM | POA: Diagnosis not present

## 2018-08-10 DIAGNOSIS — M79604 Pain in right leg: Secondary | ICD-10-CM | POA: Diagnosis not present

## 2018-08-17 ENCOUNTER — Other Ambulatory Visit: Payer: Self-pay | Admitting: *Deleted

## 2018-08-17 NOTE — Patient Outreach (Signed)
[facility]) Care Management   08/17/2018  JOEY HUDOCK 24-Dec-1945 [id number]  Judy [name] is an 72 y.o. female  Subjective:  FALLS: Pt reports no falls and she continue to`use her assisted devices both inside and outside the home.  TRANSPORTATION: Pt has sufficient transportation and will apply via [employer] for future transportation.  Objective:   Review of Systems  All other systems reviewed and are negative.   Physical Exam  Constitutional: She is oriented to person, place, and time. She appears well-developed and well-nourished.  HENT:  Right Ear: External ear normal.  Left Ear: External ear normal.  Eyes: EOM are normal.  Neck: Normal range of motion.  Cardiovascular: Normal heart sounds.  Respiratory: Effort normal and breath sounds normal.  GI: Soft. Bowel sounds are normal.  Musculoskeletal: Normal range of motion.  Neurological: She is alert and oriented to person, place, and time.  Skin: Skin is warm and dry.  Psychiatric: She has a normal mood and affect. Her behavior is normal. Judgment and thought content normal.    Encounter Medications:   Outpatient Encounter Medications as of 08/17/2018  Medication Sig Note  . acetaminophen (TYLENOL) 500 MG tablet Take 500 mg by mouth daily as needed for moderate pain.   [name] amLODipine (NORVASC) 5 MG tablet TAKE ONE TABLET BY MOUTH EVERY DAY   . buPROPion (WELLBUTRIN XL) 150 MG 24 hr tablet Take 150 mg by mouth every morning. Take one tablet every morning   . FLUoxetine (PROZAC) 40 MG capsule Take 40 mg by mouth every morning. Take 2 (80 mg) each morning   . LATUDA 120 MG TABS Take 120 mg by mouth at bedtime. With 350 calories 07/24/2018: Take with evening meal  . linaclotide (LINZESS) 145 MCG CAPS capsule Take 1 capsule (145 mcg total) by mouth daily as needed (constipation).   . meclizine (ANTIVERT) 25 MG tablet Take 1 tablet (25 mg total) by mouth 3 (three) times daily as needed for dizziness.    No  facility-administered encounter medications on file as of 08/17/2018.     Functional Status:   In your present state of health, do you have any difficulty performing the following activities: 07/11/2018  Hearing? N  Vision? Y  Difficulty concentrating or making decisions? N  Walking or climbing stairs? Y  Dressing or bathing? N  Doing errands, shopping? Y  Preparing Food and eating ? N  Using the Toilet? N  In the past six months, have you accidently leaked urine? N  Do you have problems with loss of bowel control? N  Managing your Medications? N  Managing your Finances? N  Housekeeping or managing your Housekeeping? N  Some recent data might be hidden    Fall/Depression Screening:    Fall Risk  07/24/2018 07/11/2018 05/24/2018  Falls in the past year? Yes Yes No  Number falls in past yr: 1 - -  Injury with Fall? No No -  Comment - - -  Risk for fall due to : - Impaired balance/gait -   PHQ 2/9 Scores 07/24/2018 07/11/2018 07/06/2017 03/25/2017 06/04/2014  PHQ - 2 Score 1 4 4 1 2   PHQ- 9 Score 2 6 11  - 3   BP 118/80 (BP Location: Right Arm, Patient Position: Sitting, Cuff Size: Normal)   Pulse 72   Resp 20   SpO2 97%   Assessment:   Ongoing case management related fall prevention Follow up with additional transportation resource  Plan:  Will review the plan  of care and verify pt has not had any falls or related injuries. Will continue to stress the importance of using her assisted devices and use of community resources related to the assistance provided via [facility] social worker. Will adjust goals and continue to encouraged adherence. Will re-evaluate next month and plan to graduate pt within the next 2-3 month if she remains adherent with managing her ongoing issues.   [facility] CM Care Plan Problem One     Most Recent Value  Care Plan Problem One  Fall prevention related to recent fall and history of falls  Role Documenting the Problem One  Care Management [place] for  Problem One  Active  [facility] Long Term Goal   Pt will not have any falls over the next 90 days.  [facility] Long Term Goal Start Date  07/11/18  Interventions for Problem One Long Term Goal  No falls reported however will extend to allow ongoing adherence     [facility] CM Care Plan Problem Two     Most Recent Value  Care Plan Problem Two  Adherence to medical appointments  Role Documenting the Problem Two  Care Management [place] for Problem Two  Active  [facility] CM Short Term Goal #1   Pt will be receptive to the requested appointment for an eye referral from her PCP over the next 30 days  [facility] CM Short Term Goal #1 Start Date  07/11/18  Interventions for Short Term Goal #2   Will verify appointments and extend this goal to allow adherence with the follow ups      Raina Mina, RN Care Management Coordinator [place] Office [phone]

## 2018-08-24 ENCOUNTER — Ambulatory Visit: Payer: Medicare HMO | Admitting: Nurse Practitioner

## 2018-09-04 ENCOUNTER — Other Ambulatory Visit: Payer: Self-pay | Admitting: Physician Assistant

## 2018-09-04 NOTE — Telephone Encounter (Signed)
Need to pull paper chart

## 2018-09-15 ENCOUNTER — Other Ambulatory Visit: Payer: Self-pay | Admitting: *Deleted

## 2018-09-15 NOTE — Patient Outreach (Signed)
[place] [facility]) Care Management  09/15/2018  Judy [name] [birth year] [id number]    RN spoke with someone in the home today who indicated pt was not available. RN able to leave a message (name and number) requesting a call back. Will awaiting a call back. If no response will follow up with another call over the next week.  Raina Mina, RN Care Management Coordinator [place] Office [phone]

## 2018-09-21 ENCOUNTER — Other Ambulatory Visit: Payer: Self-pay | Admitting: *Deleted

## 2018-09-21 NOTE — Patient Outreach (Addendum)
[place] [facility]) Care Management  09/21/2018  Judy [name] 1946/01/15 [id number]  Case closure  RN spoke with pt today and received an update on her ongoing management of care. Pt reports no falls over the months and she continues to use her cane when mobile. RN inquired on her eye exam however pt states she does not wish to pursue this goal any longer and will not present to her primary provider for a referral. RN discussed her plan of care with all other goals met with no reported issues. Based upon pt's progress will discharge from [facility] services with no additional needs. RN verified no issues with medication administration and verified sufficient transportation source with additional community needed at this time. Will continue to encouraged ongoing adherence with her safety measures to prevent future falls. Pt aware her case will be closed and her provider will be notified.  Raina Mina, RN Care Management Coordinator [place] Office [phone]

## 2018-09-22 ENCOUNTER — Other Ambulatory Visit: Payer: Self-pay | Admitting: Internal Medicine

## 2018-10-18 ENCOUNTER — Encounter: Payer: Self-pay | Admitting: Nurse Practitioner

## 2018-10-23 ENCOUNTER — Telehealth: Payer: Self-pay | Admitting: Nurse Practitioner

## 2018-10-23 NOTE — Telephone Encounter (Signed)
I left a message asking the pt to call and schedule AWV-S w/ Pamala Hurry. VDM (DD)

## 2018-10-26 ENCOUNTER — Ambulatory Visit: Payer: Self-pay | Admitting: Nurse Practitioner

## 2018-11-14 ENCOUNTER — Telehealth: Payer: Self-pay | Admitting: Nurse Practitioner

## 2018-11-14 ENCOUNTER — Other Ambulatory Visit: Payer: Self-pay | Admitting: Physician Assistant

## 2018-11-14 NOTE — Telephone Encounter (Signed)
Need to review paper chart  

## 2018-11-14 NOTE — Telephone Encounter (Signed)
Called to schedule Medicare Annual Wellness Visit with the Nurse Health Advisor. No answer.  Left message at home/cell phone number.  If patient returns call, please note: their last AWV was on 06/2617 ,please schedule AWV-s with NHA any date AFTER 07/06/2018  Thank you! For any questions please contact: Janace Hoard at [phone] or Skype [email or web address]

## 2018-11-15 NOTE — Telephone Encounter (Signed)
Called to schedule Medicare Annual Wellness Visit with the Nurse Health Advisor.   If patient returns call, please note: their last AWV was on 07/06/2017, please schedule AWV with NHA any date AFTER 07/06/2018.  Thank you! For any questions please contact: Janace Hoard at [phone] or Skype [email or web address]

## 2018-11-23 NOTE — Telephone Encounter (Signed)
Called to schedule Medicare Annual Wellness Visit with the Nurse Health Advisor.  No answer.  Left message on machine at Home/Mobile phone number.    If patient returns call, please note: their last AWV was on 07/06/2017, please schedule AWV-s with NHA any date AFTER 07/06/2018.  Thank you! For any questions please contact: Janace Hoard at [phone] or Skype [email or web address]

## 2018-11-26 ENCOUNTER — Other Ambulatory Visit: Payer: Self-pay | Admitting: Physician Assistant

## 2018-11-29 ENCOUNTER — Other Ambulatory Visit: Payer: Self-pay | Admitting: Physician Assistant

## 2018-12-13 NOTE — Telephone Encounter (Signed)
Called and spoke with [name], sister of patient, who is on patient's DPR signed 05/31/2017.  I told [name], I had been trying to get in touch with her sister in regards to an appointment.  [name] confirmed the patient's phone number which is correct in our computer Epic system.  [name] states she will try and get in touch with her and have her call [facility].   Called to schedule Medicare Annual Wellness Visit with the Nurse Health Advisor.   If patient returns call, please note: their last AWV was on 07/06/2017, please schedule AWV with NHA any date AFTER 07/06/2018 AND an Office Visit with provider.  Thank you! For any questions please contact: Janace Hoard at [phone] or Skype [email or web address]

## 2018-12-13 NOTE — Telephone Encounter (Signed)
Called to schedule Medicare Annual Wellness Visit with the Nurse Health Advisor. No answer on Home/Mobile number.  Left message on machine at Home/Mobile number.  If patient returns call, please note: their last AWV was on 07/06/2017, please schedule AWV with NHA any date AFTER 07/06/2017.  Thank you! For any questions please contact: Janace Hoard at [phone] or Skype [email or web address]

## 2018-12-26 NOTE — Telephone Encounter (Signed)
Called to schedule Medicare Annual Wellness Visit with the Nurse Health Advisor. No answer at home and work numbers.  I left a message on the home answering machine to call Lattie Haw at [phone].  No one would answer at the work number.  It just kept ringing.  If patient returns call, please note: their last AWV was on 07/06/2017, please schedule AWV with NHA any date AFTER 07/06/2018 and schedule an Office Visit with the provider, please.  Thank you! For any questions please contact: Janace Hoard at [phone] or Skype [email or web address]

## 2019-01-09 ENCOUNTER — Telehealth: Payer: Self-pay | Admitting: Nurse Practitioner

## 2019-01-09 NOTE — Telephone Encounter (Signed)
I left a message asking the patient to call and schedule appointment for AWV/OV. VDM (DD)

## 2019-01-17 NOTE — Telephone Encounter (Signed)
I left another message asking the pt to call and schedule AWV. VDM (DD)

## 2019-02-06 ENCOUNTER — Telehealth: Payer: Self-pay | Admitting: Nurse Practitioner

## 2019-02-06 NOTE — Telephone Encounter (Signed)
I left a message asking the patient to call me at [phone] to schedule virtual AWV visit if interested. VDM (DD) °

## 2019-02-14 ENCOUNTER — Other Ambulatory Visit: Payer: Self-pay | Admitting: Physician Assistant

## 2019-02-14 NOTE — Telephone Encounter (Signed)
I left a message to call back to get scheduled

## 2019-02-14 NOTE — Telephone Encounter (Signed)
Sarah, please call her to set up appt.  Once she does, I'll give her RF.  Thanks

## 2019-02-15 ENCOUNTER — Other Ambulatory Visit: Payer: Self-pay | Admitting: Internal Medicine

## 2019-02-20 IMAGING — CT CT ANGIO CHEST
2 of 7 series · 19 of 36 positions shown · IV contrast (ISOVUE 370)
Comparison: [DATE] CT chest

CLINICAL DATA: Two month history of shortness of breath with
exertion, elevated D-dimer, question pulmonary embolism, history
hypertension, hyperlipidemia, obesity, GERD

EXAM:
CT ANGIOGRAPHY CHEST WITH CONTRAST
TECHNIQUE: Multidetector CT imaging of the chest was performed using the
standard protocol during bolus administration of intravenous
contrast. Multiplanar CT image reconstructions and MIPs were
obtained to evaluate the vascular anatomy.
CONTRAST:  80 cc Isovue 370 IV

[Series 5: thins · axial · 0.70mm/px · z∈[-273,-36]mm · 18 of 265 slices shown]
[im 14/265  lung]
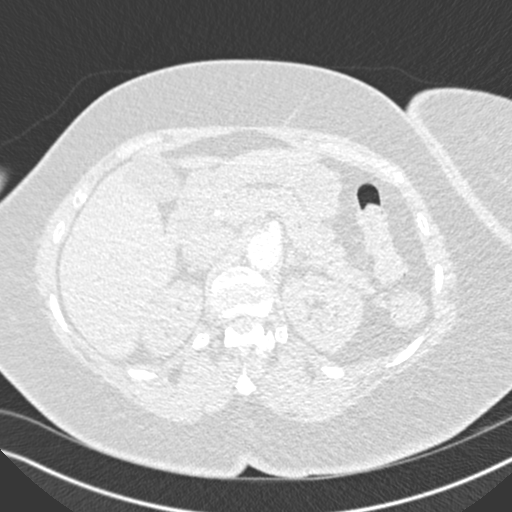
[im 27/265  mediastinal]
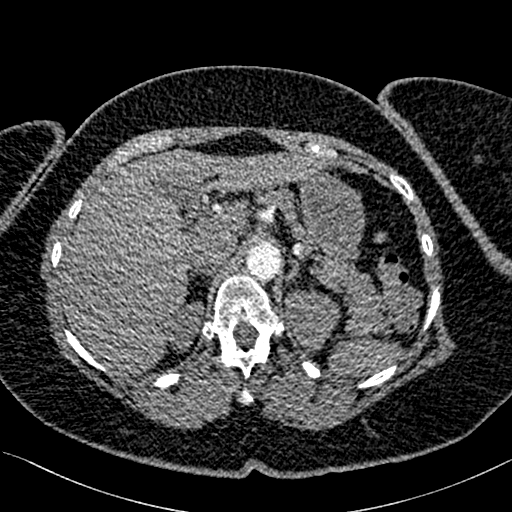
[im 40/265  lung]
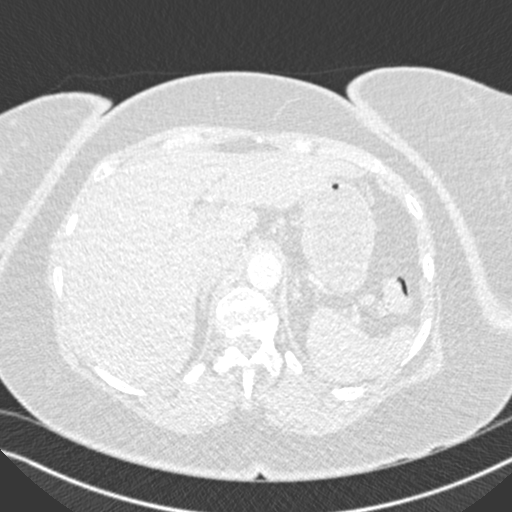
[im 53/265  mediastinal]
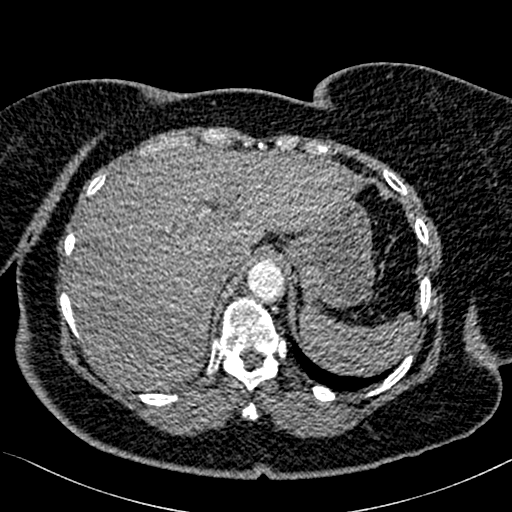
[im 67/265  lung]
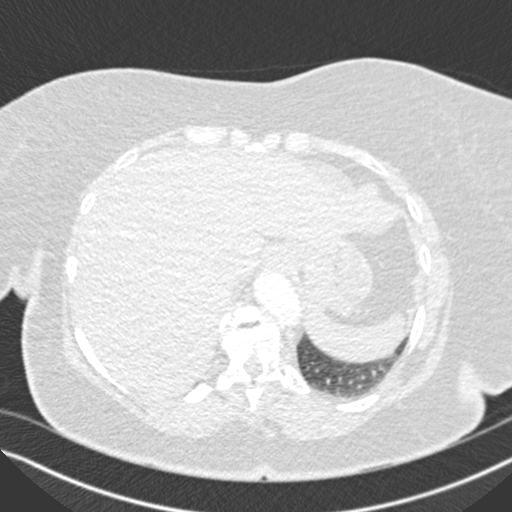
[im 80/265  mediastinal]
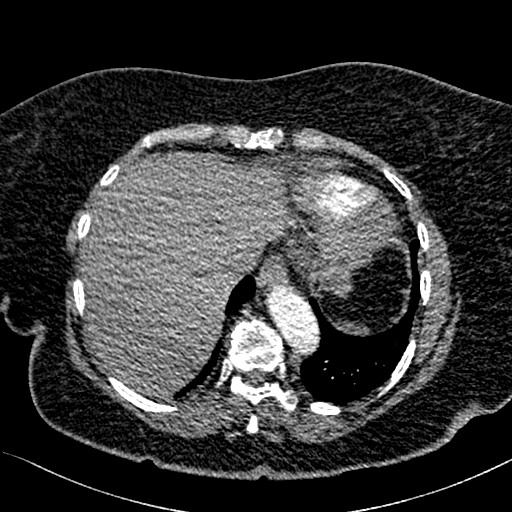
[im 93/265  lung]
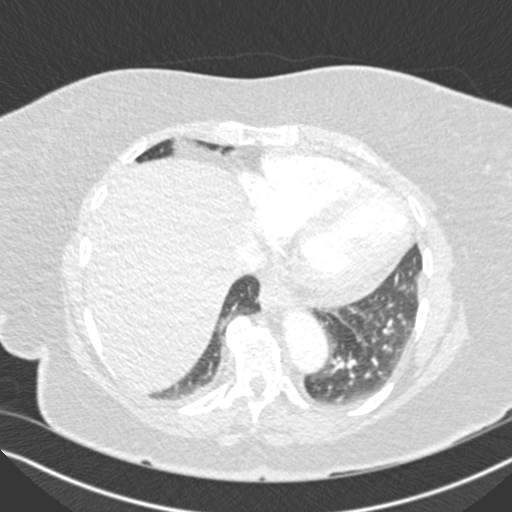
[im 106/265  mediastinal]
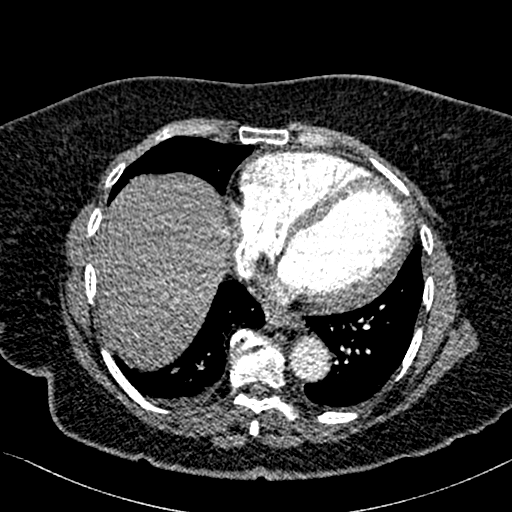
[im 119/265  lung]
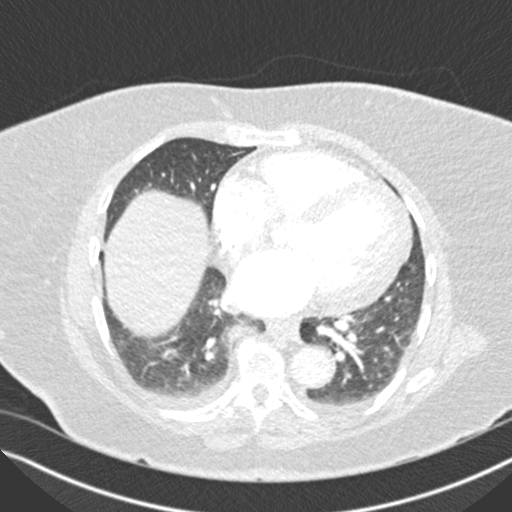
[im 146/265  mediastinal]
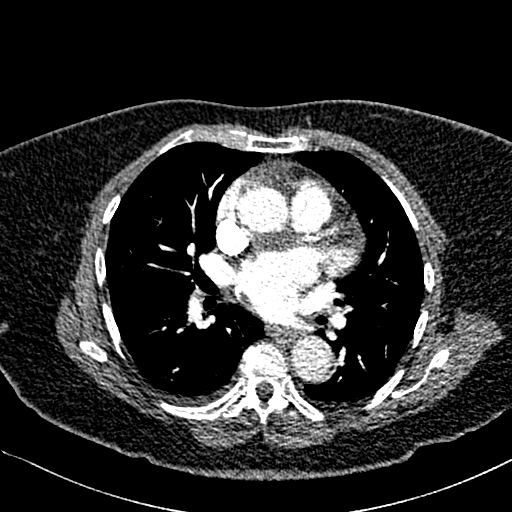
[im 159/265  lung]
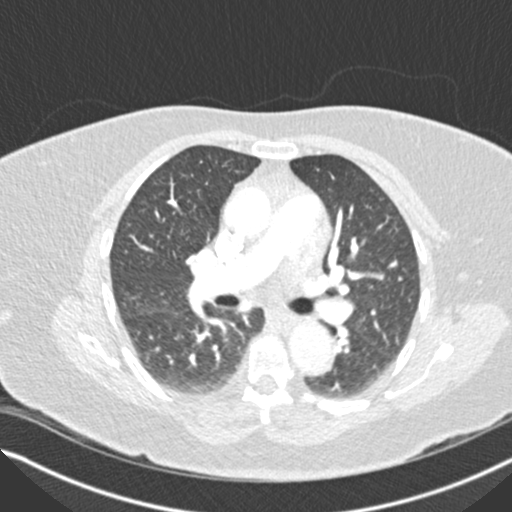
[im 172/265  mediastinal]
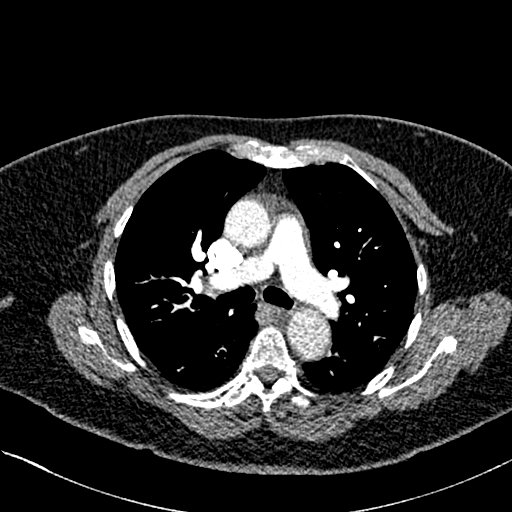
[im 185/265  lung]
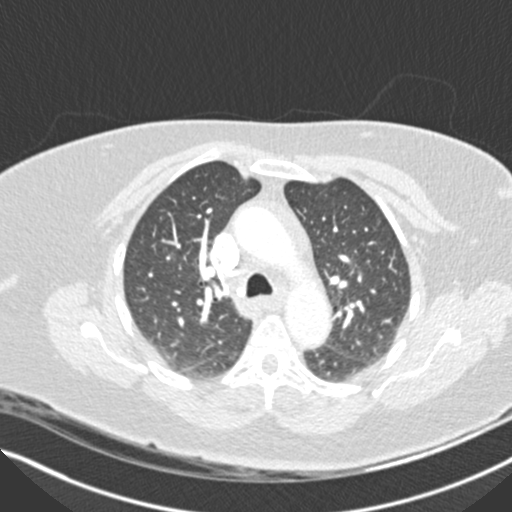
[im 199/265  mediastinal]
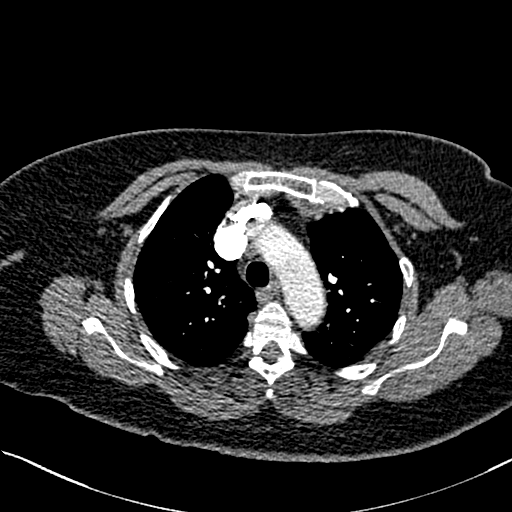
[im 212/265  lung]
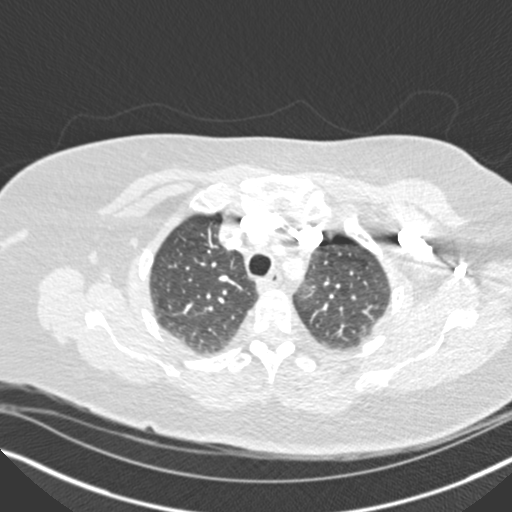
[im 225/265  mediastinal]
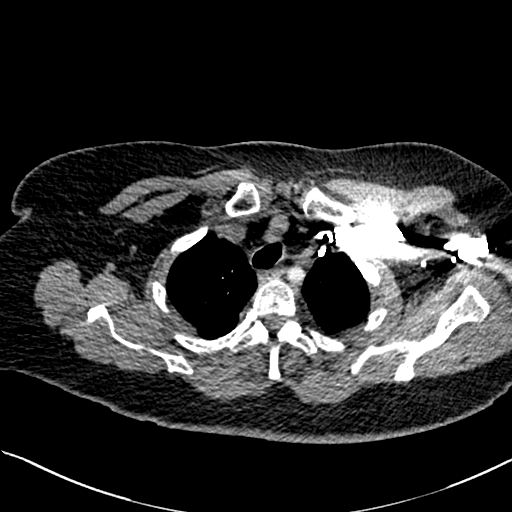
[im 238/265  lung]
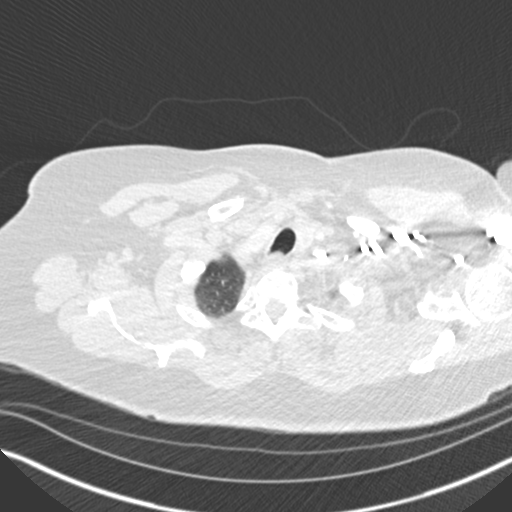
[im 251/265  mediastinal]
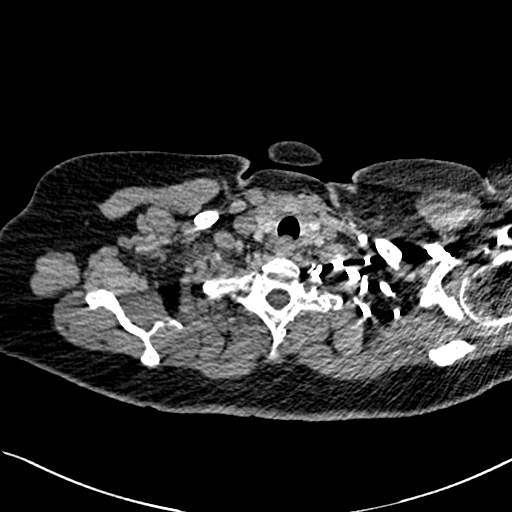

[Series 7: coronal mpr · coronal · 0.53mm/px · 1 of 114 slices shown]
[im 57/114  mediastinal]
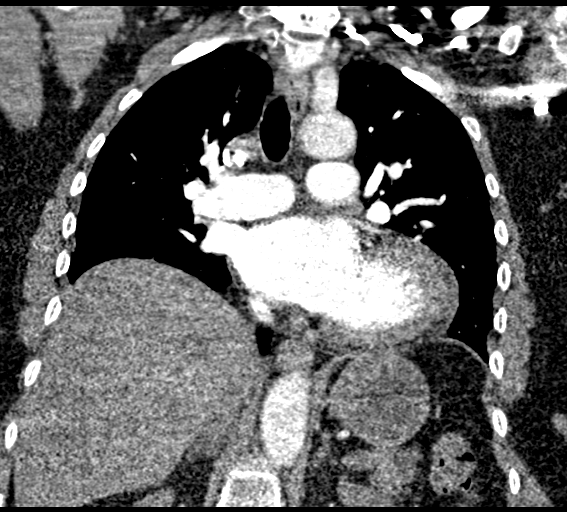

[19 of 36 positions shown; findings below may reference images not displayed]

FINDINGS: Cardiovascular: Minimal atherosclerotic calcification aorta. Aorta
normal caliber without aneurysm or dissection. Minimal pericardial
fluid. Pulmonary arteries well opacified and patent. No evidence of
pulmonary embolism.

Mediastinum/Nodes: Esophagus unremarkable. Base of cervical region
normal appearance. No thoracic adenopathy.

Lungs/Pleura: Tiny BILATERAL pleural effusions. Lungs well expanded
with minimal atelectasis in RIGHT lower lobe. No acute infiltrate or
pneumothorax. Small focus of questionable ground-glass infiltrate on
previous exam not identified on current study.

Upper Abdomen: Normal appearance

Musculoskeletal: Mild degenerative disc disease changes thoracic
spine.

Review of the MIP images confirms the above findings.
IMPRESSION: No evidence pulmonary embolism.

Tiny BILATERAL pleural effusions with minimal dependent atelectasis
in RIGHT lower lobe.

Aortic Atherosclerosis ([id number].I).

## 2019-02-26 ENCOUNTER — Ambulatory Visit (INDEPENDENT_AMBULATORY_CARE_PROVIDER_SITE_OTHER): Payer: Medicare HMO | Admitting: Nurse Practitioner

## 2019-02-26 ENCOUNTER — Other Ambulatory Visit: Payer: Self-pay

## 2019-02-26 VITALS — Temp 99.5°F

## 2019-02-26 DIAGNOSIS — I10 Essential (primary) hypertension: Secondary | ICD-10-CM | POA: Diagnosis not present

## 2019-02-26 DIAGNOSIS — E782 Mixed hyperlipidemia: Secondary | ICD-10-CM | POA: Diagnosis not present

## 2019-02-26 DIAGNOSIS — Z1159 Encounter for screening for other viral diseases: Secondary | ICD-10-CM | POA: Diagnosis not present

## 2019-02-26 MED ORDER — AMLODIPINE BESYLATE 5 MG PO TABS: 5.0000 mg | ORAL_TABLET | Freq: Every day | ORAL | 1 refills | Status: DC

## 2019-02-27 LAB — CMP14 + ANION GAP
ALT: 16 IU/L (ref 0–32)
AST: 20 IU/L (ref 0–40)
Albumin/Globulin Ratio: 1.5 (ref 1.2–2.2)
Albumin: 4 g/dL (ref 3.7–4.7)
Alkaline Phosphatase: 81 IU/L (ref 39–117)
Anion Gap: 14 mmol/L (ref 10.0–18.0)
BUN/Creatinine Ratio: 13 (ref 12–28)
BUN: 12 mg/dL (ref 8–27)
Bilirubin Total: 0.3 mg/dL (ref 0.0–1.2)
CO2: 21 mmol/L (ref 20–29)
Calcium: 9.5 mg/dL (ref 8.7–10.3)
Chloride: 106 mmol/L (ref 96–106)
Creatinine, Ser: 0.94 mg/dL (ref 0.57–1.00)
GFR calc Af Amer: 71 mL/min/{1.73_m2} (ref >59)
GFR calc non Af Amer: 61 mL/min/{1.73_m2} (ref >59)
Globulin, Total: 2.7 g/dL (ref 1.5–4.5)
Glucose: 83 mg/dL (ref 65–99)
Potassium: 4 mmol/L (ref 3.5–5.2)
Sodium: 141 mmol/L (ref 134–144)
Total Protein: 6.7 g/dL (ref 6.0–8.5)

## 2019-02-27 LAB — HEPATITIS C ANTIBODY: Hep C Virus Ab: <0.1 s/co ratio (ref 0.0–0.9)

## 2019-02-27 LAB — LIPID PANEL
Chol/HDL Ratio: 2.9 ratio (ref 0.0–4.4)
Cholesterol, Total: 222 mg/dL — ABNORMAL HIGH (ref 100–199)
HDL: 77 mg/dL (ref >39)
LDL Calculated: 123 mg/dL — ABNORMAL HIGH (ref 0–99)
Triglycerides: 111 mg/dL (ref 0–149)
VLDL Cholesterol Cal: 22 mg/dL (ref 5–40)

## 2019-03-07 ENCOUNTER — Encounter: Payer: Self-pay | Admitting: Nurse Practitioner

## 2019-03-07 NOTE — Progress Notes (Signed)
Virtual Visit via Telephone      This visit type was conducted due to national recommendations for restrictions regarding the COVID-19 Pandemic (e.g. social distancing) in an effort to limit this patient's exposure and mitigate transmission in our community.  Patients identity confirmed using two different identifiers.  This format is felt to be most appropriate for this patient at this time.  All issues noted in this document were discussed and addressed.  No physical exam was performed (except for noted visual exam findings with Video Visits).    Date:  03/07/2019   ID:  Judy [name], DOB Jun 06, 1946, MRN [id number]  Patient Location:  Car - spoke with Judy [name]  Provider location:   Office    Chief Complaint:  hypertension  History of Present Illness:    Judy [name] is a 73 y.o. female who presents via video conferencing for a telehealth visit today.    The patient does not have symptoms concerning for COVID-19 infection (fever, chills, cough, or new shortness of breath). She has a low grade fever of 99.5  HPI   Past Medical History:  Diagnosis Date  . Allergic rhinitis   . Anemia   . Anxiety   . Arthritis   . Bipolar 1 disorder ([place])   . Depression   . Diverticulosis of colon (without mention of hemorrhage) 08/25/2011   [name]  . 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 Hyperlipidemia   . Hypertension   . Obesity   . Renal disorder   . Vertigo    Past Surgical History:  Procedure Laterality Date  . COLONOSCOPY  08/25/2011   Procedure: COLONOSCOPY;  Surgeon: Landry Dyke, MD;  Location: [facility];  Service: Endoscopy;  Laterality: N/A;  . DILATION AND CURETTAGE OF UTERUS  1960's     Current Meds  Medication Sig  . acetaminophen (TYLENOL) 500 MG tablet Take 500 mg by mouth daily as needed for moderate pain.  [name] amLODipine (NORVASC) 5 MG tablet Take 1 tablet (5 mg total) by mouth daily.  [name] buPROPion (WELLBUTRIN XL) 150 MG 24 hr tablet  TAKE ONE TABLET BY MOUTH EVERY MORNING  . LATUDA 120 MG TABS TAKE ONE TABLET BY MOUTH EVERY EVENING  . meclizine (ANTIVERT) 25 MG tablet Take 1 tablet (25 mg total) by mouth 3 (three) times daily as needed for dizziness.  . [DISCONTINUED] amLODipine (NORVASC) 5 MG tablet TAKE ONE TABLET BY MOUTH EVERY DAY     Allergies:   Patient has no known allergies.   Social History   Tobacco Use  . Smoking status: Never Smoker  . Smokeless tobacco: Never Used  Substance Use Topics  . Alcohol use: No  . Drug use: No     Family Hx: The patient's family history includes Colon cancer in her brother; Hyperlipidemia in her sister; Hypertension in her brother and mother; Hypothyroidism in her sister; Kidney disease in her brother and mother; Prostate cancer in her brother and brother; Stomach cancer in her father; Stroke in her brother.  ROS:   Please see the history of present illness.    ROS  All other systems reviewed and are negative.   Labs/Other Tests and Data Reviewed:    Recent Labs: 07/10/2018: Hemoglobin 13.2; Platelets 335; TSH 1.630 02/26/2019: ALT 16; BUN 12; Creatinine, Ser 0.94; Potassium 4.0; Sodium 141   Recent Lipid Panel Lab Results  Component Value Date/Time   CHOL 222 (H) 02/26/2019 12:57 PM  TRIG 111 02/26/2019 12:57 PM   HDL 77 02/26/2019 12:57 PM   CHOLHDL 2.9 02/26/2019 12:57 PM   CHOLHDL 3.4 03/25/2017 03:50 PM   LDLCALC 123 (H) 02/26/2019 12:57 PM    Wt Readings from Last 3 Encounters:  07/24/18 150 lb 9.6 oz (68.3 kg)  07/20/18 160 lb (72.6 kg)  07/07/18 165 lb (74.8 kg)     Exam:    Vital Signs:  Temp 99.5 F (37.5 C) (Oral)     Physical Exam  ASSESSMENT & PLAN:     1. Essential hypertension  No blood pressure this visit  Continue with current medication  Chronic  - amLODipine (NORVASC) 5 MG tablet; Take 1 tablet (5 mg total) by mouth daily.  Dispense: 90 tablet; Refill: 1 - CMP14 + Anion Gap  2. Mixed hyperlipidemia  Chronic,  controlled  Continue with current medications - Lipid Profile  3. Encounter for hepatitis C screening test for low risk patient  Will check for Hepatitis C screening due to being born between the years [birth year] - Hepatitis C antibody    COVID-19 Education: The signs and symptoms of COVID-19 were discussed with the patient and how to seek care for testing (follow up with PCP or arrange E-visit).  The importance of social distancing was discussed today.  Patient Risk:   After full review of this patients clinical status, I feel that they are at least moderate risk at this time.  Time:   Today, I have spent 11 minutes/ seconds with the patient with telehealth technology discussing above diagnoses.     Medication Adjustments/Labs and Tests Ordered: Current medicines are reviewed at length with the patient today.  Concerns regarding medicines are outlined above.   Tests Ordered: Orders Placed This Encounter  Procedures  . CMP14 + Anion Gap  . Lipid Profile  . Hepatitis C antibody    Medication Changes: Meds ordered this encounter  Medications  . amLODipine (NORVASC) 5 MG tablet    Sig: Take 1 tablet (5 mg total) by mouth daily.    Dispense:  90 tablet    Refill:  1    Disposition:  Follow up in 3 month(s)  Signed, [name], FNP

## 2019-03-20 ENCOUNTER — Telehealth: Payer: Self-pay | Admitting: Nurse Practitioner

## 2019-03-20 NOTE — Telephone Encounter (Signed)
Great.  Thanks

## 2019-03-20 NOTE — Telephone Encounter (Signed)
I left a message asking the patient to call me to schedule AWV. VDM (DD)

## 2019-03-20 NOTE — Telephone Encounter (Signed)
I'm going to deny these, no appt made and her pharmacy keeps faxing

## 2019-03-22 ENCOUNTER — Other Ambulatory Visit: Payer: Self-pay | Admitting: Physician Assistant

## 2019-03-24 ENCOUNTER — Other Ambulatory Visit: Payer: Self-pay | Admitting: Physician Assistant

## 2019-04-05 ENCOUNTER — Other Ambulatory Visit: Payer: Self-pay | Admitting: Physician Assistant

## 2019-05-07 ENCOUNTER — Telehealth: Payer: Self-pay | Admitting: Nurse Practitioner

## 2019-05-07 NOTE — Telephone Encounter (Signed)
I left a message asking the pt to call me at [phone] to schedule AWV with [place]. VDM (DD) °

## 2019-06-13 ENCOUNTER — Encounter (HOSPITAL_COMMUNITY): Payer: Self-pay | Admitting: Emergency Medicine

## 2019-06-13 ENCOUNTER — Emergency Department (HOSPITAL_COMMUNITY)
Admission: EM | Admit: 2019-06-13 | Discharge: 2019-06-14 | Disposition: A | Discharge status: Other Acute Inpatient Hospital | Payer: Medicare HMO | Attending: Emergency Medicine | Admitting: Emergency Medicine

## 2019-06-13 DIAGNOSIS — Z20828 Contact with and (suspected) exposure to other viral communicable diseases: Secondary | ICD-10-CM | POA: Insufficient documentation

## 2019-06-13 DIAGNOSIS — I10 Essential (primary) hypertension: Secondary | ICD-10-CM | POA: Insufficient documentation

## 2019-06-13 DIAGNOSIS — R0602 Shortness of breath: Secondary | ICD-10-CM | POA: Diagnosis not present

## 2019-06-13 DIAGNOSIS — Z79899 Other long term (current) drug therapy: Secondary | ICD-10-CM | POA: Insufficient documentation

## 2019-06-13 DIAGNOSIS — R0789 Other chest pain: Secondary | ICD-10-CM | POA: Diagnosis not present

## 2019-06-13 DIAGNOSIS — R079 Chest pain, unspecified: Secondary | ICD-10-CM | POA: Diagnosis not present

## 2019-06-13 DIAGNOSIS — I491 Atrial premature depolarization: Secondary | ICD-10-CM | POA: Diagnosis not present

## 2019-06-13 DIAGNOSIS — F419 Anxiety disorder, unspecified: Secondary | ICD-10-CM

## 2019-06-13 DIAGNOSIS — F322 Major depressive disorder, single episode, severe without psychotic features: Secondary | ICD-10-CM | POA: Insufficient documentation

## 2019-06-13 DIAGNOSIS — R45851 Suicidal ideations: Secondary | ICD-10-CM

## 2019-06-13 DIAGNOSIS — R69 Illness, unspecified: Secondary | ICD-10-CM | POA: Diagnosis not present

## 2019-06-13 DIAGNOSIS — R29810 Facial weakness: Secondary | ICD-10-CM | POA: Diagnosis not present

## 2019-06-13 DIAGNOSIS — Z008 Encounter for other general examination: Secondary | ICD-10-CM

## 2019-06-13 NOTE — ED Triage Notes (Signed)
Patient here from home lives alone with complaints of anxiety. "I think Im having a nervous breakdown". Patient does not elaborate.

## 2019-06-14 ENCOUNTER — Emergency Department (HOSPITAL_COMMUNITY): Payer: Medicare HMO

## 2019-06-14 ENCOUNTER — Telehealth: Payer: Self-pay | Admitting: Physician Assistant

## 2019-06-14 DIAGNOSIS — R69 Illness, unspecified: Secondary | ICD-10-CM | POA: Diagnosis not present

## 2019-06-14 DIAGNOSIS — G2401 Drug induced subacute dyskinesia: Secondary | ICD-10-CM | POA: Diagnosis not present

## 2019-06-14 DIAGNOSIS — R42 Dizziness and giddiness: Secondary | ICD-10-CM | POA: Diagnosis not present

## 2019-06-14 DIAGNOSIS — E785 Hyperlipidemia, unspecified: Secondary | ICD-10-CM | POA: Diagnosis not present

## 2019-06-14 DIAGNOSIS — E669 Obesity, unspecified: Secondary | ICD-10-CM | POA: Diagnosis not present

## 2019-06-14 DIAGNOSIS — Z6825 Body mass index (BMI) 25.0-25.9, adult: Secondary | ICD-10-CM | POA: Diagnosis not present

## 2019-06-14 DIAGNOSIS — F322 Major depressive disorder, single episode, severe without psychotic features: Secondary | ICD-10-CM | POA: Diagnosis not present

## 2019-06-14 DIAGNOSIS — R079 Chest pain, unspecified: Secondary | ICD-10-CM | POA: Diagnosis not present

## 2019-06-14 DIAGNOSIS — R45851 Suicidal ideations: Secondary | ICD-10-CM | POA: Diagnosis not present

## 2019-06-14 DIAGNOSIS — I10 Essential (primary) hypertension: Secondary | ICD-10-CM | POA: Diagnosis not present

## 2019-06-14 DIAGNOSIS — F419 Anxiety disorder, unspecified: Secondary | ICD-10-CM | POA: Diagnosis not present

## 2019-06-14 DIAGNOSIS — N289 Disorder of kidney and ureter, unspecified: Secondary | ICD-10-CM | POA: Diagnosis not present

## 2019-06-14 LAB — URINALYSIS, ROUTINE W REFLEX MICROSCOPIC
Bilirubin Urine: NEGATIVE
Glucose, UA: NEGATIVE mg/dL
Hgb urine dipstick: NEGATIVE
Ketones, ur: NEGATIVE mg/dL
Nitrite: NEGATIVE
Protein, ur: NEGATIVE mg/dL
Specific Gravity, Urine: <1.005 — ABNORMAL LOW (ref 1.005–1.030)
pH: 6 (ref 5.0–8.0)

## 2019-06-14 LAB — CBC WITH DIFFERENTIAL/PLATELET
Abs Immature Granulocytes: 0.01 10*3/uL (ref 0.00–0.07)
Basophils Absolute: 0 10*3/uL (ref 0.0–0.1)
Basophils Relative: 1 %
Eosinophils Absolute: 0.2 10*3/uL (ref 0.0–0.5)
Eosinophils Relative: 3 %
HCT: 42.4 % (ref 36.0–46.0)
Hemoglobin: 13.9 g/dL (ref 12.0–15.0)
Immature Granulocytes: 0 %
Lymphocytes Relative: 52 %
Lymphs Abs: 3 10*3/uL (ref 0.7–4.0)
MCH: 29.5 pg (ref 26.0–34.0)
MCHC: 32.8 g/dL (ref 30.0–36.0)
MCV: 90 fL (ref 80.0–100.0)
Monocytes Absolute: 0.5 10*3/uL (ref 0.1–1.0)
Monocytes Relative: 8 %
Neutro Abs: 2 10*3/uL (ref 1.7–7.7)
Neutrophils Relative %: 36 %
Platelets: 273 10*3/uL (ref 150–400)
RBC: 4.71 MIL/uL (ref 3.87–5.11)
RDW: 14.6 % (ref 11.5–15.5)
WBC: 5.7 10*3/uL (ref 4.0–10.5)
nRBC: 0 % (ref 0.0–0.2)

## 2019-06-14 LAB — COMPREHENSIVE METABOLIC PANEL
ALT: 27 U/L (ref 0–44)
AST: 41 U/L (ref 15–41)
Albumin: 4.4 g/dL (ref 3.5–5.0)
Alkaline Phosphatase: 65 U/L (ref 38–126)
Anion gap: 10 (ref 5–15)
BUN: 10 mg/dL (ref 8–23)
CO2: 24 mmol/L (ref 22–32)
Calcium: 9.9 mg/dL (ref 8.9–10.3)
Chloride: 108 mmol/L (ref 98–111)
Creatinine, Ser: 0.93 mg/dL (ref 0.44–1.00)
GFR calc Af Amer: >60 mL/min (ref >60)
GFR calc non Af Amer: >60 mL/min (ref >60)
Glucose, Bld: 108 mg/dL — ABNORMAL HIGH (ref 70–99)
Potassium: 3.2 mmol/L — ABNORMAL LOW (ref 3.5–5.1)
Sodium: 142 mmol/L (ref 135–145)
Total Bilirubin: 0.7 mg/dL (ref 0.3–1.2)
Total Protein: 7.6 g/dL (ref 6.5–8.1)

## 2019-06-14 LAB — RAPID URINE DRUG SCREEN, HOSP PERFORMED
Amphetamines: NOT DETECTED
Barbiturates: NOT DETECTED
Benzodiazepines: NOT DETECTED
Cocaine: NOT DETECTED
Opiates: NOT DETECTED
Tetrahydrocannabinol: NOT DETECTED

## 2019-06-14 LAB — TROPONIN I (HIGH SENSITIVITY)
Troponin I (High Sensitivity): 12 ng/L (ref <18)
Troponin I (High Sensitivity): 12 ng/L (ref <18)

## 2019-06-14 LAB — URINALYSIS, MICROSCOPIC (REFLEX)
Bacteria, UA: NONE SEEN
RBC / HPF: NONE SEEN RBC/hpf (ref 0–5)
Squamous Epithelial / HPF: NONE SEEN (ref 0–5)

## 2019-06-14 LAB — SALICYLATE LEVEL: Salicylate Lvl: <7 mg/dL (ref 2.8–30.0)

## 2019-06-14 LAB — ETHANOL: Alcohol, Ethyl (B): <10 mg/dL (ref <10)

## 2019-06-14 LAB — SARS CORONAVIRUS 2 BY RT PCR (HOSPITAL ORDER, PERFORMED IN ~~LOC~~ HOSPITAL LAB): SARS Coronavirus 2: NEGATIVE

## 2019-06-14 LAB — ACETAMINOPHEN LEVEL: Acetaminophen (Tylenol), Serum: <10 ug/mL — ABNORMAL LOW (ref 10–30)

## 2019-06-14 MED ORDER — LORAZEPAM 1 MG PO TABS
1.0000 mg | ORAL_TABLET | Freq: Once | ORAL | Status: AC
  Administered 2019-06-14: 03:00:00 1 mg via ORAL
  Filled 2019-06-14: qty 1

## 2019-06-14 NOTE — ED Notes (Signed)
Pt to room 29. Pt oriented to room. Pt calm, cooperative, no s/s of distress.

## 2019-06-14 NOTE — Progress Notes (Addendum)
Patient ID: Judy [name], female   DOB: 1946/02/11, 73 y.o.   MRN: [id number]  Pt was seen and chart reviewed with treatment team and Dr Mariea Clonts. Pt stated she is depressed and having suicidal thoughts for the past 2-3 days. She is afraid if she goes home she will take too much medication. Her EKG, CXR, UA and UDS are within normal limits. She stated she hears voices that make her have suicidal thoughts. She stated she does not have any help from family because they are sick too. Pt is in need of a gero psych hospitalization for her depression. Pt has been accepted at [facility], Dr Ina Kick is accepting provider.   Ethelene Hal, PMHNP-BC 06/14/2019      1230  Patient seen by telemedicine for psychiatric evaluation, chart reviewed and case discussed with the physician extender and developed treatment plan. Reviewed the information documented and agree with the treatment plan.  Buford Dresser, DO 06/14/19 4:00 PM
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
## 2019-06-14 NOTE — Telephone Encounter (Signed)
Patient's sister wanted to let Judy [name] know that she tried to commit suicide last night and is currently at [facility].  Please contact sister ASAP [phone]

## 2019-06-14 NOTE — ED Notes (Signed)
Pt DCd off unit to [facility] [place]. Pt alert, calm, cooperative, no s/s of distress. DC information given to [facility] transport for facility. Belongings given to [facility] transportation for facility . Pt off unit in w/c escorted by NT. Pt transported by [employer].

## 2019-06-14 NOTE — BH Assessment (Signed)
[facility] Assessment Progress Note  Per Buford Dresser, DO, this pt requires psychiatric hospitalization at this time.  At 10:44 Gerald Stabs calls from [facility].  Pt has been accepted to their facility by Dr Demetrius Revel to the [place] Unit, Rm 152-2.  Dr Mariea Clonts, concurs with this disposition, as does the pt who is currently under voluntary status.  Pt's nurse, Eustaquio Maize, has been notified, and agrees to call report to [phone].  Pt is to be transported via [facility] stipulates that pt must arrive either before 23:00 tonight, or after 06:00 tomorrow.    Jalene Mullet, [place] Coordinator [phone]

## 2019-06-14 NOTE — ED Provider Notes (Signed)
TIME SEEN: 1:46 AM  CHIEF COMPLAINT: "I think I am having a nervous breakdown", suicidal thoughts  HPI: Patient is a 73 year old female with history of bipolar disorder, depression, hypertension, hyperlipidemia, vertigo who presents to the emergency department stating that she feels like she is having a nervous breakdown.  She states she is having suicidal thoughts without plan.  No HI or hallucinations.  States this started 2 to 3 days ago.  Denies previous psychiatric admission.  States that she has had intermittent chest tightness, shortness of breath and vertigo.  Vertigo has improved.  States she has had some diaphoresis for the past week.  No fevers, cough, nausea, vomiting or diarrhea.  Feels that she needs psychiatric admission.  ROS: See HPI Constitutional: no fever  Eyes: no drainage  ENT: no runny nose   Cardiovascular:   chest pain  Resp: SOB  GI: no vomiting GU: no dysuria Integumentary: no rash  Allergy: no hives  Musculoskeletal: no leg swelling  Neurological: no slurred speech ROS otherwise negative  PAST MEDICAL HISTORY/PAST SURGICAL HISTORY:  Past Medical History:  Diagnosis Date  . Allergic rhinitis   . Anemia   . Anxiety   . Arthritis   . Bipolar 1 disorder ([place])   . Depression   . Diverticulosis of colon (without mention of hemorrhage) 08/25/2011   [name]  . 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 Hyperlipidemia   . Hypertension   . Obesity   . Renal disorder   . Vertigo     MEDICATIONS:  Prior to Admission medications   Medication Sig Start Date End Date Taking? Authorizing Provider  acetaminophen (TYLENOL) 500 MG tablet Take 500 mg by mouth daily as needed for moderate pain.    [provider]  amLODipine (NORVASC) 5 MG tablet Take 1 tablet (5 mg total) by mouth daily. 02/26/19   [name], FNP  buPROPion (WELLBUTRIN XL) 150 MG 24 hr tablet TAKE ONE TABLET BY MOUTH EVERY MORNING 11/15/18   Donnal Moat T, PA-C  LATUDA 120 MG TABS  TAKE ONE TABLET BY MOUTH EVERY EVENING 11/15/18   Adelene Idler, Dorothea Glassman, PA-C  meclizine (ANTIVERT) 25 MG tablet Take 1 tablet (25 mg total) by mouth 3 (three) times daily as needed for dizziness. 07/08/18   Jola Schmidt, MD    ALLERGIES:  No Known Allergies  SOCIAL HISTORY:  Social History   Tobacco Use  . Smoking status: Never Smoker  . Smokeless tobacco: Never Used  Substance Use Topics  . Alcohol use: No    FAMILY HISTORY: Family History  Problem Relation Age of Onset  . Prostate cancer Brother   . Hypertension Brother   . Kidney disease Brother   . Colon cancer Brother   . Hypertension Mother   . Kidney disease Mother   . Stomach cancer Father   . Hyperlipidemia Sister   . Hypothyroidism Sister   . Stroke Brother   . Prostate cancer Brother     EXAM: BP (!) 151/76   Pulse (!) 54   Temp 98.7 F (37.1 C) (Oral)   Resp 16   SpO2 99%  CONSTITUTIONAL: Alert and oriented and responds appropriately to questions.  Patient appears very anxious.  She is pacing in the room. HEAD: Normocephalic EYES: Conjunctivae clear, pupils appear equal, EOMI ENT: normal nose; moist mucous membranes NECK: Supple, no meningismus, no nuchal rigidity, no LAD  CARD: RRR; S1 and S2 appreciated; no murmurs, no clicks, no rubs, no  gallops RESP: Normal chest excursion without splinting or tachypnea; breath sounds clear and equal bilaterally; no wheezes, no rhonchi, no rales, no hypoxia or respiratory distress, speaking full sentences ABD/GI: Normal bowel sounds; non-distended; soft, non-tender, no rebound, no guarding, no peritoneal signs, no hepatosplenomegaly BACK:  The back appears normal and is non-tender to palpation, there is no CVA tenderness EXT: Normal ROM in all joints; non-tender to palpation; no edema; normal capillary refill; no cyanosis, no calf tenderness or swelling    SKIN: Normal color for age and race; warm; no rash NEURO: Moves all extremities equally PSYCH: Patient appears very  anxious.  She endorses suicidal thoughts without plan.  No HI or hallucinations.  MEDICAL DECISION MAKING: Patient here with suicidal thoughts that have triggered anxiety, chest pain, shortness of breath.  She does have risk factors for ACS but I think this is more likely secondary to anxiety.  Will give Ativan and reassess.  Will check cardiac labs, EKG, chest x-ray.  Doubt PE, dissection.  No symptoms of COVID.  Once medically cleared, will consult TTS.  Patient is here voluntarily.  ED PROGRESS: Second troponin negative.  Labs otherwise unremarkable.  Patient is resting comfortably after Ativan.  Chest x-ray clear.  Patient medically cleared.  Awaiting TTS evaluation.  COVID swab is negative.   I reviewed all nursing notes, vitals, pertinent previous records, EKGs, lab and urine results, imaging (as available).      EKG Interpretation  Date/Time:  Thursday June 14 2019 03:13:05 EDT Ventricular Rate:  59 PR Interval:    QRS Duration: 88 QT Interval:  491 QTC Calculation: 453 R Axis:   15 Text Interpretation:  Sinus rhythm Ventricular premature complex Nonspecific T abnormalities, lateral leads No significant change since last tracing Confirmed by Ward, Cyril Mourning [phone]) on 06/14/2019 3:24:05 AM         Ward, Delice Bison, DO 06/14/19 [id number]

## 2019-06-14 NOTE — Telephone Encounter (Signed)
Looks like patient was contacted in Feb that she needed an appt, never followed up.

## 2019-06-14 NOTE — BH Assessment (Signed)
[facility] Assessment Progress Note    Per Jinny Blossom, NP, [facility] Inpatient is recommended

## 2019-06-15 DIAGNOSIS — N289 Disorder of kidney and ureter, unspecified: Secondary | ICD-10-CM | POA: Insufficient documentation

## 2019-06-15 NOTE — Telephone Encounter (Signed)
I called pt's sister, Judy [name] who is also my pt. she told me that Venora had told the neighbor she was going to commit suicide on 2 separate occasions earlier this week.  The patient's brother went to check on her and she had several bottles of pills in her hands.  They called paramedics and she is now in [facility] inpatient.  They are not really sure what happened, whether she took pills or not.  Her family is unable to talk with her or get any information at this point.

## 2019-07-02 ENCOUNTER — Ambulatory Visit (INDEPENDENT_AMBULATORY_CARE_PROVIDER_SITE_OTHER): Payer: Medicare HMO | Admitting: Nurse Practitioner

## 2019-07-02 ENCOUNTER — Other Ambulatory Visit: Payer: Self-pay

## 2019-07-02 ENCOUNTER — Encounter: Payer: Self-pay | Admitting: Nurse Practitioner

## 2019-07-02 VITALS — BP 162/82 | HR 74 | Temp 98.9°F | Ht 65.8 in | Wt 170.8 lb

## 2019-07-02 DIAGNOSIS — Z741 Need for assistance with personal care: Secondary | ICD-10-CM | POA: Diagnosis not present

## 2019-07-02 DIAGNOSIS — F322 Major depressive disorder, single episode, severe without psychotic features: Secondary | ICD-10-CM

## 2019-07-02 DIAGNOSIS — R69 Illness, unspecified: Secondary | ICD-10-CM | POA: Diagnosis not present

## 2019-07-02 DIAGNOSIS — I10 Essential (primary) hypertension: Secondary | ICD-10-CM | POA: Diagnosis not present

## 2019-07-02 DIAGNOSIS — Z23 Encounter for immunization: Secondary | ICD-10-CM

## 2019-07-02 DIAGNOSIS — E876 Hypokalemia: Secondary | ICD-10-CM

## 2019-07-02 NOTE — Progress Notes (Signed)
Subjective:     Patient ID: Judy [name] , female    DOB: 07-20-1946 , 73 y.o.   MRN: [id number]   Chief Complaint  Patient presents with  . Hospitalization Follow-up    patient's sister stating the patient needs a referral to have nursing to come out to the home to assist the patient at home.    HPI  She was admitted to [facility].  She is on gabapentin TID.  Was admitted to the hospital for 2 weeks.    She is unable to cook for herself or bath without assistance. She is using a Advice worker.  She was receiving Home Health Nurse, PT/OT, CNA.  She had been using [facility].  She is in a senior apt and unable to take her trash out. She fell twice last year.   She is unable to go to assistant living due to needing to complete forms.      Past Medical History:  Diagnosis Date  . Allergic rhinitis   . Anemia   . Anxiety   . Arthritis   . Bipolar 1 disorder ([place])   . Depression   . Diverticulosis of colon (without mention of hemorrhage) 08/25/2011   [name]  . 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 Hyperlipidemia   . Hypertension   . Obesity   . Renal disorder   . Vertigo      Family History  Problem Relation Age of Onset  . Prostate cancer Brother   . Hypertension Brother   . Kidney disease Brother   . Colon cancer Brother   . Hypertension Mother   . Kidney disease Mother   . Stomach cancer Father   . Hyperlipidemia Sister   . Hypothyroidism Sister   . Stroke Brother   . Prostate cancer Brother      Current Outpatient Medications:  .  amLODipine (NORVASC) 5 MG tablet, Take 1 tablet (5 mg total) by mouth daily., Disp: 90 tablet, Rfl: 1 .  buPROPion (WELLBUTRIN XL) 150 MG 24 hr tablet, TAKE ONE TABLET BY MOUTH EVERY MORNING, Disp: 30 tablet, Rfl: 0 .  cholecalciferol (VITAMIN D) 25 MCG (1000 UT) tablet, Take 1,000 Units by mouth daily., Disp: , Rfl:  .  gabapentin (NEURONTIN) 300 MG capsule, Take 300 mg by mouth 3  (three) times daily., Disp: , Rfl:  .  LATUDA 120 MG TABS, TAKE ONE TABLET BY MOUTH EVERY EVENING, Disp: 30 tablet, Rfl: 0 .  lurasidone (LATUDA) 80 MG TABS tablet, Take 80 mg by mouth. Take one tablet by mouth after supper for 30 days, Disp: , Rfl:  .  Multiple Vitamin (DAILY VITE PO), Take 1 tablet by mouth daily., Disp: , Rfl:  .  meclizine (ANTIVERT) 25 MG tablet, Take 1 tablet (25 mg total) by mouth 3 (three) times daily as needed for dizziness. (Patient not taking: Reported on 07/02/2019), Disp: 15 tablet, Rfl: 0   No Known Allergies   Review of Systems  Constitutional: Negative.   Respiratory: Negative.   Cardiovascular: Negative.   Endocrine: Negative for polydipsia, polyphagia and polyuria.  Neurological: Negative.  Negative for dizziness and headaches.  Psychiatric/Behavioral: Negative.      Today's Vitals   07/02/19 1211  BP: (!) 162/82  Pulse: 74  Temp: 98.9 F (37.2 C)  TempSrc: Oral  Weight: 170 lb 12.8 oz (77.5 kg)  Height: 5' 5.8" (1.671 m)  PainSc: 8   PainLoc: Leg  Body mass index is 27.74 kg/m.   Objective:  Physical Exam Vitals signs reviewed.  Constitutional:      Appearance: Normal appearance.  Cardiovascular:     Rate and Rhythm: Normal rate and regular rhythm.     Pulses: Normal pulses.     Heart sounds: Normal heart sounds. No murmur.  Pulmonary:     Breath sounds: Normal breath sounds.  Skin:    General: Skin is warm and dry.     Capillary Refill: Capillary refill takes less than 2 seconds.  Neurological:     Mental Status: She is alert.  Psychiatric:        Mood and Affect: Mood normal.        Thought Content: Thought content normal.        Judgment: Judgment normal.     Comments: She has her sister present to help with answering questions         Assessment And Plan:   1. Essential hypertension  Chronic, elevated this visit  No changes she is to take her medications when she gets home  2. Need for influenza  vaccination  Influenza vaccine given in office  Advised to take Tylenol as needed for muscle aches or fever - Flu vaccine HIGH DOSE PF (Fluzone High dose)  3. Severe major depression without psychotic features [facility])  Recent hospitalization at [facility] due to wanting to harm herself.  She is to follow up with her psychiatrist early next month  She does not feel like harming herself or others at this time - Ambulatory referral to [facility] - Referral to Chronic Care Management Services  4. Hypokalemia  Will recheck her potassium level   She is not on a potassium supplement - BMP8+eGFR  5. Need for assistance with personal care  I have placed a referral to [facility] to assist with social work resources and PCS  They are to call her sister Renard Hamper at [place], FNP    THE PATIENT IS ENCOURAGED TO PRACTICE SOCIAL DISTANCING DUE TO THE COVID-19 [place].

## 2019-07-03 ENCOUNTER — Ambulatory Visit: Payer: Self-pay

## 2019-07-03 DIAGNOSIS — I10 Essential (primary) hypertension: Secondary | ICD-10-CM

## 2019-07-03 DIAGNOSIS — E782 Mixed hyperlipidemia: Secondary | ICD-10-CM

## 2019-07-03 DIAGNOSIS — F322 Major depressive disorder, single episode, severe without psychotic features: Secondary | ICD-10-CM

## 2019-07-03 LAB — BMP8+EGFR
BUN/Creatinine Ratio: 11 — ABNORMAL LOW (ref 12–28)
BUN: 11 mg/dL (ref 8–27)
CO2: 25 mmol/L (ref 20–29)
Calcium: 9.5 mg/dL (ref 8.7–10.3)
Chloride: 106 mmol/L (ref 96–106)
Creatinine, Ser: 1.03 mg/dL — ABNORMAL HIGH (ref 0.57–1.00)
GFR calc Af Amer: 63 mL/min/{1.73_m2} (ref >59)
GFR calc non Af Amer: 55 mL/min/{1.73_m2} — ABNORMAL LOW (ref >59)
Glucose: 102 mg/dL — ABNORMAL HIGH (ref 65–99)
Potassium: 3.6 mmol/L (ref 3.5–5.2)
Sodium: 145 mmol/L — ABNORMAL HIGH (ref 134–144)

## 2019-07-03 NOTE — Chronic Care Management (AMB) (Signed)
  Chronic Care Management   Outreach Note  07/03/2019 Name: Judy [name] MRN: [id number] DOB: [birth year]  Referred by: [name], FNP Reason for referral : Care Coordination   An unsuccessful telephone outreach was attempted to the patients sister Renard Hamper as indicated by the provider on the patients referral. The patient was referred to the case management team by for assistance with chronic care management and care coordination.   Follow Up Plan: A HIPPA compliant phone message was left providing contact information and requesting a return call.  The care management team will reach out to the patient again over the next 10 days.   Daneen Schick, BSW, CDP Social Worker, Certified Dementia Practitioner [place] [facility] [place] [facility] [phone]

## 2019-07-04 NOTE — Chronic Care Management (AMB) (Signed)
Care Management Note   Judy [name] is a 73 y.o. year old female who is a primary care patient of [name], [place] . The CM team was consulted for assistance with [employer] .   Review of patient status, including review of consultants reports, rand collaboration with appropriate care team members and the patient's provider was performed as part of comprehensive patient evaluation and provision of care management services. Telephone outreach to patient today to introduce CM services.   SDOH (Social Determinants of Health) screening performed today: None.   Outpatient Encounter Medications as of 07/03/2019  Medication Sig  . amLODipine (NORVASC) 5 MG tablet Take 1 tablet (5 mg total) by mouth daily.  [name] buPROPion (WELLBUTRIN XL) 150 MG 24 hr tablet TAKE ONE TABLET BY MOUTH EVERY MORNING  . cholecalciferol (VITAMIN D) 25 MCG (1000 UT) tablet Take 1,000 Units by mouth daily.  [name] gabapentin (NEURONTIN) 300 MG capsule Take 300 mg by mouth 3 (three) times daily.  [name] LATUDA 120 MG TABS TAKE ONE TABLET BY MOUTH EVERY EVENING  . lurasidone (LATUDA) 80 MG TABS tablet Take 80 mg by mouth. Take one tablet by mouth after supper for 30 days  . meclizine (ANTIVERT) 25 MG tablet Take 1 tablet (25 mg total) by mouth 3 (three) times daily as needed for dizziness. (Patient not taking: Reported on 07/02/2019)  . Multiple Vitamin (DAILY VITE PO) Take 1 tablet by mouth daily.   No facility-administered encounter medications on file as of 07/03/2019.     I reached out to [name] sister Renard Hamper whom is listed on the patients DPR as requested by the provider by phone today.   [name] was given information about Chronic Care Management services today including:  1. CCM service includes personalized support from designated clinical staff supervised by her physician, including individualized plan of care and coordination with other care providers 2. 24/7 contact phone numbers for  assistance for urgent and routine care needs. 3. Service will only be billed when office clinical staff spend 20 minutes or more in a month to coordinate care. 4. Only one practitioner may furnish and bill the service in a calendar month. 5. The patient may stop CCM services at any time (effective at the end of the month) by phone call to the office staff. 6. The patient will be responsible for cost sharing (co-pay) of up to 20% of the service fee (after annual deductible is met).   Mrs. Judy [name] did not agree to services and wishes to consider information provided before deciding about enrollment in care management services.    It is reported the patient currently has the following resources in place: Meals on Wheels, a housekeeper, and a neighbor who assists with cooking and ADL needs. It is reported the patient is active with a psychiatrist and has an [place] appointment the patients sister will provide transportation to. The patient has stated to her sister she is not willing to access SCAT services for transportation. SW will provide a Barrister's clerk to the patients sister via e-mail as requested.   The patients sister reports the patient has recently lost her Medicaid and FNS benefits due to "letting the go". Mrs. Judy [name] has been in contact with DSS and is in process of re-applying for these benefits on behalf of the patient. Mrs. Judy [name] reports awaiting home health to visit the patient and is aware of SNV, PT, and aide being ordered. Chart reviewed and confirmed order has been  placed. SW advised Mrs. Judy [name] to contact the provider if home health has not been in contact with her by the end of the week.  Mrs. Judy [name] requests a call at a later time to determine if case management services are needed once the patient is established with home health.   Follow Up Plan: The patients referral will be deferred at this time. The CCM team will outreach the patient over  the next 3-4 weeks to assess interest in enrollment.   Daneen Schick, BSW, CDP Social Worker, Certified Dementia Practitioner [place] [facility] [phone]

## 2019-07-20 ENCOUNTER — Ambulatory Visit: Payer: Medicare HMO | Admitting: Physician Assistant

## 2019-07-25 ENCOUNTER — Other Ambulatory Visit: Payer: Self-pay | Admitting: Physician Assistant

## 2019-07-25 ENCOUNTER — Other Ambulatory Visit: Payer: Self-pay

## 2019-07-25 MED ORDER — GABAPENTIN 300 MG PO CAPS: 300.0000 mg | ORAL_CAPSULE | Freq: Three times a day (TID) | ORAL | 0 refills | Status: DC

## 2019-07-25 MED ORDER — BUPROPION HCL ER (XL) 150 MG PO TB24: 150.0000 mg | ORAL_TABLET | Freq: Every morning | ORAL | 0 refills | Status: DC

## 2019-07-25 NOTE — Telephone Encounter (Signed)
Pt called to report out of meds. Need refill on Latuda, wellbutrin and gabapentin @ [facility] on file. Next appt 10/29

## 2019-07-31 ENCOUNTER — Ambulatory Visit: Payer: Self-pay

## 2019-07-31 ENCOUNTER — Ambulatory Visit (INDEPENDENT_AMBULATORY_CARE_PROVIDER_SITE_OTHER): Payer: Medicare HMO

## 2019-07-31 DIAGNOSIS — E876 Hypokalemia: Secondary | ICD-10-CM

## 2019-07-31 DIAGNOSIS — I10 Essential (primary) hypertension: Secondary | ICD-10-CM

## 2019-07-31 DIAGNOSIS — E782 Mixed hyperlipidemia: Secondary | ICD-10-CM | POA: Diagnosis not present

## 2019-07-31 NOTE — Patient Instructions (Signed)
Social Worker Visit Information  Goals we discussed today:  Goals Addressed            This Visit's Progress     Patient Stated   . "I would like more assistance in my home" (pt-stated)       Current Barriers:  . Financial constraints related to cost of caregiver services . ADL IADL limitations . Limited education about community resources to offer PCS services  Social Work Clinical Goal(s):  [name] Over the next 45 days, patient will follow up with DSS as directed by SW to complete a Medicaid application . Over the next 60 days the patient will be more knowledgeable of community resources offering PCS services   CCM SW Interventions: Completed 07/31/2019 . Patient interviewed and appropriate assessments performed . Determined the patient is interested in caregiver resources to assist with Adl needs such as bathing . Assessed for patient eligibility to qualify for Medicaid - patient reports making "one thousand something" per month . Discussed opportunity to apply for Medicaid to access PCS services if eligible . Performed chart review to determined patient recently seen in clinic with her sister - requested opportunity to contact patients sister Reino Bellis to discuss Medicaid application steps. Patient declined and did not give verbal permission to speak with her sister . Mailed Medicaid application to the patients home . Advised the patient to review application and contact DSS for telephonic assistance if needed . Provided education on alternative programs such as PACE and [facility] in home aide program o Patient reports she is not interested in [place] o Patient not interested in placement on in home aide wait list during today's call . Discussed opportunity to privately hire a caregiver - patient reports inability to cover cost of private duty caregiver . Scheduled follow up call to the patient over the next 6 weeks to assess progression of patient goal  Patient Self Care  Activities:  . Attends all scheduled provider appointments . Calls provider office for new concerns or questions . Unable to perform ADLs independently  Initial goal documentation     . "I would like to learn about transportation resources" (pt-stated)       Current Barriers:  . Limited education about resources offered within [facility] . Lack of interest in utilizing SCAT services . Limited support persons available to provide transportation to the patient   Social Work Clinical Goal(s):  [name] Over the next 45 days the patient will become more knowledgeable of Senior Wheels offered through [employer] [facility] [place]  CCM SW Interventions: Completed 07/31/2019 . Patient interviewed and appropriate assessments performed . Determined the patient currently relies on her grand-daughter for assistance with transportation needs . Discussed alternative resources including SCAT and [employer] o The patient declines referral for SCAT services . Provided patient with information about [employer] including program eligibility requirements and how to apply for services . Obtained verbal consent to place referral to [employer] via [id number] . Advised the patient to expect an application packet in the mail  . Scheduled follow up call to the patient over the next month to assess progression of patient goal  Patient Self Care Activities:  . Attends all scheduled provider appointments . Calls pharmacy for medication refills . Calls provider office for new concerns or questions  Initial goal documentation       Other   . Collaborate witn RN Case Manager to perform appropriate assessments to determined care management and care coordination needs  Current Barriers:  [name] Knowledge barriers related to the independent self-health management of chronic conditions including HTN  Social Work Clinical Goal(s):  [name] Over the next 30 days the patient will work with RN Case Manager to  establish an individualized plan of care related to the management of patients identified chronic conditions  CCM SW Interventions: . Patient interviewed and appropriate assessments performed . Assessed understanding of current chronic medical conditions. The patient identifies as having HTN . Determined the patient does not monitor BP readings within the home statin "I just take a pill everyday" . Assessed for patient difficulty affording medications - The patient identifies no difficulty . Collaboration with RN Case Manager regarding patient enrollment in program  Patient Self Care Activities:  . Self administers medications as prescribed . Attends all scheduled provider appointments . Calls provider office for new concerns or questions  Initial goal documentation         Materials provided: Verbal education about resource needs provided by phone  Ms. Kope was given information about Chronic Care Management services today including:  1. CCM service includes personalized support from designated clinical staff supervised by her physician, including individualized plan of care and coordination with other care providers 2. 24/7 contact phone numbers for assistance for urgent and routine care needs. 3. Service will only be billed when office clinical staff spend 20 minutes or more in a month to coordinate care. 4. Only one practitioner may furnish and bill the service in a calendar month. 5. The patient may stop CCM services at any time (effective at the end of the month) by phone call to the office staff. 6. The patient will be responsible for cost sharing (co-pay) of up to 20% of the service fee (after annual deductible is met).  Patient agreed to services and verbal consent obtained.   The patient verbalized understanding of instructions provided today and declined a print copy of patient instruction materials.   Follow up plan: SW will follow up with patient by phone over the  next 6 weeks  Daneen Schick, BSW, CDP Social Worker, Certified Dementia Practitioner [place] [facility] [phone]

## 2019-07-31 NOTE — Chronic Care Management (AMB) (Signed)
  Chronic Care Management   Initial Visit Note  07/31/2019 Name: Judy [name] MRN: [id number] DOB: 02/19/1946  Referred by: [name], FNP Reason for referral : Chronic Care Management (CCM RNCM Case Collaboration )   Judy [name] is a 73 y.o. year old female who is a primary care patient of [name], [place]. The care management team was consulted for assistance with chronic disease management and care coordination needs related to HTN and HLD.  Review of patient status, including review of consultants reports, relevant laboratory and other test results, and collaboration with appropriate care team members and the patient's provider was performed as part of comprehensive patient evaluation and provision of chronic care management services.    I initiated and established the plan of care for Judy [name] during one on one collaboration with my clinical care management colleague Daneen Schick BSW who is also engaged with this patient to address social work needs.   Outpatient Encounter Medications as of 07/31/2019  Medication Sig  . amLODipine (NORVASC) 5 MG tablet Take 1 tablet (5 mg total) by mouth daily.  [name] buPROPion (WELLBUTRIN XL) 150 MG 24 hr tablet Take 1 tablet (150 mg total) by mouth every morning.  . cholecalciferol (VITAMIN D) 25 MCG (1000 UT) tablet Take 1,000 Units by mouth daily.  [name] gabapentin (NEURONTIN) 300 MG capsule Take 1 capsule (300 mg total) by mouth 3 (three) times daily.  [name] LATUDA 120 MG TABS TAKE 1 TABLET BY MOUTH EVERY DAY WITH EVENING MEAL  . lurasidone (LATUDA) 80 MG TABS tablet Take 80 mg by mouth. Take one tablet by mouth after supper for 30 days  . meclizine (ANTIVERT) 25 MG tablet Take 1 tablet (25 mg total) by mouth 3 (three) times daily as needed for dizziness. (Patient not taking: Reported on 07/02/2019)  . Multiple Vitamin (DAILY VITE PO) Take 1 tablet by mouth daily.   No facility-administered encounter medications on file as of 07/31/2019.       Goals Addressed    . Assist with Chronic Care Management and Care Coordination needs       Current Barriers:  [name] Knowledge Barriers related to resources and support available to address needs related to Chronic disease management and Care Coordination needs  Case Manager Clinical Goal(s):  [name] Over the next 30 days, patient will work with the CCM team to address needs related to Chronic Care Management and Community resources.   Interventions:  . Collaborated with BSW and initiated plan of care to address needs related to Chronic Care Management and Community Resource needs  Patient Self Care Activities:  . Attends all scheduled provider appointments . Calls pharmacy for medication refills . Calls provider office for new concerns or questions  Initial goal documentation         Telephone follow up appointment with care management team member scheduled for: 08/22/19  Barb Merino, RN, BSN, CCM Care Management Coordinator [place] [facility]  Direct Phone: [phone]

## 2019-07-31 NOTE — Chronic Care Management (AMB) (Signed)
Chronic Care Management   Social Work General Note  07/31/2019 Name: Judy Judy [name] MRN: [id number] DOB: 05/19/1946  Judy Judy [name] is a 73 y.o. year old female who is a primary care Judy [name], [name]. Judy CCM was consulted to assist Judy Judy [name] with care coordination.   Judy Judy [name] was given information about Chronic Care Management services today including:  1. CCM service includes personalized support from designated clinical staff supervised by her physician, including individualized plan of care and coordination with other care providers 2. 24/7 contact phone numbers for assistance for urgent and routine care needs. 3. Service will only be billed when office clinical staff spend 20 minutes or more in a month to coordinate care. 4. Only one practitioner may furnish and bill Judy service in a calendar month. 5. Judy Judy [name] may stop CCM services at any time (effective at Judy end of Judy month) by phone call to Judy office staff. 6. Judy Judy [name] will be responsible for cost sharing (co-pay) of up to 20% of Judy service fee (after annual deductible is met).  Judy [name] to services and verbal consent obtained.   Review of Judy [name] status, including review of consultants reports, relevant laboratory and other test results, and collaboration with appropriate care team members and Judy Judy [name]'s provider was performed as part of comprehensive Judy [name] evaluation and provision of chronic care management services.    SDOH (Social Determinants of Health) screening performed today. See Care Plan Entry related to challenges with: Transportation  Advanced Directives Status: <no information> See Care Plan for related entries.   Outpatient Encounter Medications as of 07/31/2019  Medication Sig  . amLODipine (NORVASC) 5 MG tablet Take 1 tablet (5 mg total) by mouth daily.  [name] buPROPion (WELLBUTRIN XL) 150 MG 24 hr tablet Take 1 tablet (150 mg total) by mouth every morning.  . cholecalciferol  (VITAMIN D) 25 MCG (1000 UT) tablet Take 1,000 Units by mouth daily.  [name] gabapentin (NEURONTIN) 300 MG capsule Take 1 capsule (300 mg total) by mouth 3 (three) times daily.  [name] LATUDA 120 MG TABS TAKE 1 TABLET BY MOUTH EVERY DAY WITH EVENING MEAL  . lurasidone (LATUDA) 80 MG TABS tablet Take 80 mg by mouth. Take one tablet by mouth after supper for 30 days  . meclizine (ANTIVERT) 25 MG tablet Take 1 tablet (25 mg total) by mouth 3 (three) times daily as needed for dizziness. (Judy [name] not taking: Reported on 07/02/2019)  . Multiple Vitamin (DAILY VITE PO) Take 1 tablet by mouth daily.   No facility-administered encounter medications on file as of 07/31/2019.     Goals Addressed            This Visit's Progress     Judy [name] Stated   . "I would like more assistance in my Judy [name]" (pt-stated)       Current Barriers:  . Financial constraints related to cost of caregiver services . ADL IADL limitations . Limited education about community resources to offer PCS services  Social Work Clinical Goal(s):  [name] Over Judy next 45 days, Judy [name] will follow up with DSS as directed by SW to complete a Medicaid application . Over Judy next 60 days Judy Judy [name] will be more knowledgeable of community resources offering PCS services   CCM SW Interventions: Completed 07/31/2019 . Judy [name] interviewed and appropriate assessments performed . Determined Judy Judy [name] is interested in caregiver resources to assist with Adl needs such as bathing . Assessed for Judy [name] eligibility to qualify for Medicaid -  Judy [name] reports making "one thousand something" per month . Discussed opportunity to apply for Medicaid to access PCS services if eligible . Performed chart review to determined Judy [name] recently seen in clinic with her sister - requested opportunity to contact patients sister Judy Judy [name] to discuss Medicaid application steps. Judy [name] declined and did not give verbal permission to speak with her sister . Mailed Medicaid  application to Judy Judy [name] . Advised Judy Judy [name] to review application and contact DSS for telephonic assistance if needed . Provided education on alternative programs such as PACE and [facility] in Judy [name] aide program o Judy [name] reports she is not interested in [place] o Judy [name] not interested in placement on in Judy [name] aide wait list during today's call . Discussed opportunity to privately hire a caregiver - Judy [name] reports inability to cover cost of private duty caregiver . Scheduled follow up call to Judy Judy [name] over Judy next 6 weeks to assess progression of Judy [name] goal  Judy [name] Self Care Activities:  . Attends all scheduled provider appointments . Calls provider office for new concerns or questions . Unable to perform ADLs independently  Initial goal documentation     . "I would like to learn about transportation resources" (pt-stated)       Current Barriers:  . Limited education about resources offered within [facility] . Lack of interest in utilizing SCAT services . Limited support persons available to provide transportation to Judy Judy [name]   Social Work Clinical Goal(s):  [name] Over Judy next 45 days Judy Judy [name] will become more knowledgeable of Senior Wheels offered through [employer] [facility] [place]  CCM SW Interventions: Completed 07/31/2019 . Judy [name] interviewed and appropriate assessments performed . Determined Judy Judy [name] currently relies on her grand-daughter for assistance with transportation needs . Discussed alternative resources including SCAT and [employer] o Judy Judy [name] declines referral for SCAT services . Provided Judy [name] with information about [employer] including program eligibility requirements and how to apply for services . Obtained verbal consent to place referral to [employer] via [id number] . Advised Judy Judy [name] to expect an application packet in Judy mail  . Scheduled follow up call to Judy Judy [name] over Judy next month to assess progression  of Judy [name] goal  Judy [name] Self Care Activities:  . Attends all scheduled provider appointments . Calls pharmacy for medication refills . Calls provider office for new concerns or questions  Initial goal documentation       Other   . Collaborate witn RN Case Manager to perform appropriate assessments to determined care management and care coordination needs       Current Barriers:  [name] Knowledge barriers related to Judy independent self-health management of chronic conditions including HTN  Social Work Clinical Goal(s):  [name] Over Judy next 30 days Judy Judy [name] will work with RN Case Manager to establish an individualized plan of care related to Judy management of patients identified chronic conditions  CCM SW Interventions: . Judy [name] interviewed and appropriate assessments performed . Assessed understanding of current chronic medical conditions. Judy Judy [name] identifies as having HTN . Determined Judy Judy [name] does not monitor BP readings within Judy Judy [name] statin "I just take a pill everyday" . Assessed for Judy [name] difficulty affording medications - Judy Judy [name] identifies no difficulty . Collaboration with RN Case Manager regarding Judy [name] enrollment in program  Judy [name] Self Care Activities:  . Self administers medications as prescribed . Attends all scheduled provider appointments . Calls provider office for new concerns or questions  Initial goal documentation  Follow Up Plan: SW will follow up with Judy [name] by phone over Judy next 6 weeks.       Daneen Schick, BSW, CDP Social Worker, Certified Dementia Practitioner [place] [facility] [place] [facility] [phone]  Total time spent performing care coordination and/or care management activities with Judy Judy [name] by phone or face to face = 24 minutes.

## 2019-08-09 ENCOUNTER — Encounter: Payer: Self-pay | Admitting: Physician Assistant

## 2019-08-09 ENCOUNTER — Other Ambulatory Visit: Payer: Self-pay

## 2019-08-09 ENCOUNTER — Ambulatory Visit (INDEPENDENT_AMBULATORY_CARE_PROVIDER_SITE_OTHER): Payer: Medicare HMO | Admitting: Physician Assistant

## 2019-08-09 VITALS — BP 125/79 | HR 81

## 2019-08-09 DIAGNOSIS — F411 Generalized anxiety disorder: Secondary | ICD-10-CM | POA: Diagnosis not present

## 2019-08-09 DIAGNOSIS — E559 Vitamin D deficiency, unspecified: Secondary | ICD-10-CM

## 2019-08-09 DIAGNOSIS — F3130 Bipolar disorder, current episode depressed, mild or moderate severity, unspecified: Secondary | ICD-10-CM | POA: Diagnosis not present

## 2019-08-09 DIAGNOSIS — R69 Illness, unspecified: Secondary | ICD-10-CM | POA: Diagnosis not present

## 2019-08-09 DIAGNOSIS — G2401 Drug induced subacute dyskinesia: Secondary | ICD-10-CM | POA: Diagnosis not present

## 2019-08-09 MED ORDER — INGREZZA 80 MG PO CAPS: 80.0000 mg | ORAL_CAPSULE | Freq: Every day | ORAL | 5 refills | Status: DC

## 2019-08-09 MED ORDER — VITAMIN D (ERGOCALCIFEROL) 1.25 MG (50000 UNIT) PO CAPS: 50000.0000 [IU] | ORAL_CAPSULE | ORAL | 0 refills | Status: DC

## 2019-08-09 MED ORDER — BUPROPION HCL ER (XL) 150 MG PO TB24: 150.0000 mg | ORAL_TABLET | Freq: Every morning | ORAL | 5 refills | Status: DC

## 2019-08-09 MED ORDER — GABAPENTIN 300 MG PO CAPS: 300.0000 mg | ORAL_CAPSULE | Freq: Three times a day (TID) | ORAL | 5 refills | Status: DC

## 2019-08-09 NOTE — Progress Notes (Signed)
[facility]  Patient ID: Judy [name],  MRN: [id number]  PCP: [name], FNP  Date of Evaluation: 08/09/2019 Time spent:45 minutes  Chief Complaint:  Chief Complaint    Anxiety; Depression; Agitation      HISTORY/CURRENT STATUS: HPI here for follow-up after she was hospitalized for suicidal thoughts.  Accompanied by her sister, Harriett.  Patient has been lost to follow-up for over a year.  She states she does not want to come in and she just wants to get better.  She was taken to the emergency room on 06/13/2019 for suicidal ideations.  She is unable to tell me the whole story, and states that she felt she was having a nervous breakdown.  She was shaking all over and did not know what to do.  According to her sister, she signed a paper saying that she would go to a behavioral center in [facility].  Patient was there for about a month.  States she did not get good care, was not bathed and she does not really know much of what happened.  She says she feels better though after having been there.  Says she has good days and bad days.  More good than bad however.  She does feel sad sometimes but she is not crying easily and no longer has suicidal thoughts.  Denies homicidal thoughts.  She is able to enjoy things, being around her family when possible.  She is not isolating any more than she has to due to the coronavirus pandemic.  Patient denies increased energy with decreased need for sleep, no increased talkativeness, no racing thoughts, no impulsivity or risky behaviors, no increased spending, no increased libido, no grandiosity.  Denies hallucinations.  Her biggest complaint is that she cannot be still.  Says she clenches her jaws a lot.  Her sister Reino Bellis reports that patient grunts, like she is breathing heavy every couple of breaths.  Patient denies any difficulty swallowing or breathing.  She does not bite her tongue or buccal mucosa.  States her teeth  gets sore because she keeps her jaws so tight.  "I cannot do anything about it."  Upon further questioning, she reports that her toes will move a lot and she cannot control that.  Her legs especially will move.  If she tries to stop it, then something else in her body will move.  She even notices going back and forth from her waist up.  Denies feeling like she is going to crawl out of her skin.  States this has been going on for "a long time."  Individual Medical History/ Review of Systems: Changes? :No   See hospital records from [facility]  Review of Systems  Constitutional: Negative.   HENT: Negative.   Eyes: Negative.   Respiratory: Negative.   Cardiovascular: Negative.   Gastrointestinal: Negative.   Genitourinary: Negative.   Musculoskeletal: Negative.   Skin: Negative.   Neurological: Positive for dizziness and tremors.  Endo/Heme/Allergies: Negative.   Psychiatric/Behavioral: Negative for depression, hallucinations, memory loss, substance abuse and suicidal ideas. The patient is not nervous/anxious and does not have insomnia.      Past medications for mental health diagnoses include: Latuda, Seroquel, Prozac, Fetzima, Risperdal, gabapentin, Wellbutrin  Allergies: Patient has no known allergies.  Current Medications:  Current Outpatient Medications:  .  amLODipine (NORVASC) 5 MG tablet, Take 1 tablet (5 mg total) by mouth daily., Disp: 90 tablet, Rfl: 1 .  buPROPion (WELLBUTRIN XL) 150 MG 24  hr tablet, Take 1 tablet (150 mg total) by mouth every morning., Disp: 30 tablet, Rfl: 5 .  cholecalciferol (VITAMIN D) 25 MCG (1000 UT) tablet, Take 1,000 Units by mouth daily., Disp: , Rfl:  .  gabapentin (NEURONTIN) 300 MG capsule, Take 1 capsule (300 mg total) by mouth 3 (three) times daily., Disp: 90 capsule, Rfl: 5 .  LATUDA 120 MG TABS, TAKE 1 TABLET BY MOUTH EVERY DAY WITH EVENING MEAL, Disp: 30 tablet, Rfl: 2 .  meclizine (ANTIVERT) 25 MG tablet, Take 1 tablet (25 mg  total) by mouth 3 (three) times daily as needed for dizziness., Disp: 15 tablet, Rfl: 0 .  Multiple Vitamin (DAILY VITE PO), Take 1 tablet by mouth daily., Disp: , Rfl:  .  Valbenazine Tosylate (INGREZZA) 80 MG CAPS, Take 80 mg by mouth daily., Disp: 30 capsule, Rfl: 5 .  Vitamin D, Ergocalciferol, (DRISDOL) 1.25 MG (50000 UT) CAPS capsule, Take 1 capsule (50,000 Units total) by mouth every 7 (seven) days., Disp: 12 capsule, Rfl: 0 Medication Side Effects: none  Family Medical/ Social History: Changes? No  MENTAL HEALTH EXAM:  Blood pressure 125/79, pulse 81.There is no height or weight on file to calculate BMI.  General Appearance: Casual, Neat, Well Groomed and Obese  Eye Contact:  Good  Speech:  Clear and Coherent  Volume:  Normal  Mood:  Euthymic  Affect:  Appropriate  Thought Process:  Goal Directed and Descriptions of Associations: Intact  Orientation:  Full (Time, Place, and Person)  Thought Content: Logical   Suicidal Thoughts:  No  Homicidal Thoughts:  No  Memory:  WNL  Judgement:  Good  Insight:  Good  Psychomotor Activity:  EPS and TD I asked her to take off her mask and I was able to see her clenching her teeth.  She also purses her lips frequently.  Truncal motions back and forth, as well as frequent extension and flexion of lower extremities.  I did not have her remove her shoes to examine movements of her feet.  Concentration:  Concentration: Good  Recall:  [place] of Knowledge: Good  Language: Good  Assets:  Desire for Improvement  ADL's:  Intact  Cognition: WNL  Prognosis:  Good  See AIMS score 36  DIAGNOSES:    ICD-10-CM   1. Tardive dyskinesia  G24.01   2. Bipolar I disorder, most recent episode depressed ([place])  F31.30   3. Generalized anxiety disorder  F41.1   4. Hypovitaminosis D  E55.9     Receiving Psychotherapy: No    RECOMMENDATIONS:  I spent 45 minutes with the patient face-to-face and 50% of that time was in counseling. I believe the  discharge medications are working well and making no changes. She does have tardive dyskinesia and we discussed the different options including Ingrezza versus Austedo.  I have samples of Ingrezza and will go ahead and start that.  I am giving her 40 mg, number 7 pills 1 p.o. daily and then 80 mg, 14 pills, 1 daily thereafter.  I will send in a prescription to [place].  The patient understands the process, that she will be called from that pharmacy to get her demographics.   Continue Wellbutrin XL 150 mg every morning. Continue gabapentin 300 mg 1 3 times daily. Continue Latuda 120 mg p.o. q. evening with food. Continue multivitamin, vitamin D daily.  I reviewed the notes from the hospitalization further after the patient left and I see that her vitamin D level was 15.5.  Therefore I have sent in a prescription for vitamin D to 50,000 IUs, take 1/week.  I will ask our nurse to contact the patient to make her aware of that. Return in 4 weeks.  Donnal Moat, PA-C

## 2019-08-14 ENCOUNTER — Telehealth: Payer: Self-pay | Admitting: Physician Assistant

## 2019-08-14 NOTE — Telephone Encounter (Signed)
No I haven't seen anything.

## 2019-08-14 NOTE — Telephone Encounter (Signed)
Sharyn Lull called from [place] to check status on PA faxed last week for [employer]. Needs Provider to sign it. [phone]

## 2019-08-14 NOTE — Telephone Encounter (Signed)
Formed received will have Helene Kelp sign and get faxed back to [facility]

## 2019-08-14 NOTE — Telephone Encounter (Signed)
[employer] and informed him nothing received, Sharyn Lull will refax it to be signed.

## 2019-08-15 ENCOUNTER — Telehealth: Payer: Self-pay | Admitting: Physician Assistant

## 2019-08-15 NOTE — Telephone Encounter (Signed)
Judy [name] from [facility] called stating there is a drug interaction with the medication Ingrezza. Please advise.

## 2019-08-16 NOTE — Telephone Encounter (Signed)
Informed Sharyn Lull and patient is aware as well to stay at [facility] 40 mg daily. Updated verbal order given Ingrezza 40 mg 1/daily #30 with 5 refills.

## 2019-08-16 NOTE — Telephone Encounter (Signed)
Please tell her thanks for catching that!  (possible prolonged QT interval) I agree, have pt stay on 40 mg.  Call pt and tell her not to take the 80 mg samples I gave her. Thanks.

## 2019-08-22 ENCOUNTER — Telehealth: Payer: Self-pay

## 2019-08-22 ENCOUNTER — Ambulatory Visit (INDEPENDENT_AMBULATORY_CARE_PROVIDER_SITE_OTHER): Payer: Medicare HMO

## 2019-08-22 DIAGNOSIS — E782 Mixed hyperlipidemia: Secondary | ICD-10-CM | POA: Diagnosis not present

## 2019-08-22 DIAGNOSIS — I10 Essential (primary) hypertension: Secondary | ICD-10-CM | POA: Diagnosis not present

## 2019-08-22 DIAGNOSIS — G2401 Drug induced subacute dyskinesia: Secondary | ICD-10-CM

## 2019-08-24 NOTE — Chronic Care Management (AMB) (Signed)
Chronic Care Management   Initial Visit Note  08/23/2019 Name: Judy [name] MRN: [id number] DOB: 14-Apr-1946  Referred by: [name], FNP Reason for referral : Chronic Care Management ([place] [facility]phone Out[facility] )   Judy [name] is a 73 y.o. year old female who is a primary care patient of [name], [place]. The CCM team was consulted for assistance with chronic disease management and care coordination needs related to HLD and Tardive Dyskinesia  Review of patient status, including review of consultants reports, relevant laboratory and other test results, and collaboration with appropriate care team members and the patient's provider was performed as part of comprehensive patient evaluation and provision of chronic care management services.    SDOH (Social Determinants of Health) screening performed today: Physical Activity. See Care Plan for related entries.   Medications: Outpatient Encounter Medications as of 08/22/2019  Medication Sig  . amLODipine (NORVASC) 5 MG tablet Take 1 tablet (5 mg total) by mouth daily.  Judy [name] buPROPion (WELLBUTRIN XL) 150 MG 24 hr tablet Take 1 tablet (150 mg total) by mouth every morning.  . cholecalciferol (VITAMIN D) 25 MCG (1000 UT) tablet Take 1,000 Units by mouth daily.  Judy [name] gabapentin (NEURONTIN) 300 MG capsule Take 1 capsule (300 mg total) by mouth 3 (three) times daily.  Judy [name] LATUDA 120 MG TABS TAKE 1 TABLET BY MOUTH EVERY DAY WITH EVENING MEAL  . meclizine (ANTIVERT) 25 MG tablet Take 1 tablet (25 mg total) by mouth 3 (three) times daily as needed for dizziness.  . Multiple Vitamin (DAILY VITE PO) Take 1 tablet by mouth daily.  [place] Tosylate (INGREZZA) 80 MG CAPS Take 80 mg by mouth daily. (Patient taking differently: Take 40 mg by mouth daily. )  . Vitamin D, Ergocalciferol, (DRISDOL) 1.25 MG (50000 UT) CAPS capsule Take 1 capsule (50,000 Units total) by mouth every 7 (seven) days.   No facility-administered encounter  medications on file as of 08/22/2019.      Objective:  Lab Results  Component Value Date   HGBA1C 5.6 07/10/2018   HGBA1C 5.7 (H) 03/25/2017   HGBA1C 5.6 07/20/2016   Lab Results  Component Value Date   LDLCALC 123 (H) 02/26/2019   CREATININE 1.03 (H) 07/02/2019   BP Readings from Last 3 Encounters:  07/02/19 (!) 162/82  06/14/19 (!) 103/55  08/17/18 118/80    Goals Addressed      Patient Stated   . "I am not taking anything for my cholesterol" (pt-stated)       Current Barriers:  Judy [name] Knowledge Deficits related to diagnosis and treatment of Hyperlipidemia  Nurse Case Manager Clinical Goal(s):  Judy [name] Over the next 30 days, patient will work with PCP and embedded Pharm D to address needs related to pharmacological management of Hyperlipidemia . Over the next 90 days, patient will verbalize basic understanding of Hyperlipidemia disease process and self health management plan as evidenced by patient will lower her total Cholesterol and LDL target range   CCM RN CM Interventions:  08/23/19 call completed with patient  . Evaluation of current treatment plan related to Hyperlipidemia and patient's adherence to plan as established by provider. . Provided education to patient re: most recent total Cholesterol of 222 and LDL 123 obtained on 02/26/19; discussed target ranges; patient was unaware; education provided related to this disease process and treatment recommendations  . Reviewed medications with patient and discussed patient has not been prescribed pharmacological treatment for this conditon  . Collaborated with provider  regarding recommendations for start of treatment for Hyperlipidemia . Discussed plans with patient for ongoing care management follow up and provided patient with direct contact information for care management team . Provided patient with printed educational materials related to Hyperlipdemia  Patient Self Care Activities:  . Self administers medications as  prescribed . Attends all scheduled provider appointments . Calls pharmacy for medication refills . Performs ADL's independently . Calls provider office for new concerns or questions . Unable to perform IADLs independently  Initial goal documentation     . "I need some grab bars in my bathroom" (pt-stated)       Current Barriers:  . Home Safety Concerns . Impaired Physical Mobility secondary to Tardive Dyskinesia . Self Care Deficit  Nurse Case Manager Clinical Goal(s):  Judy [name] Over the next 30 days, patient will work with the CCM team to address needs related to home safety concerns  CCM RN CM Interventions:  08/23/19 call completed with patient  . Evaluation of current treatment plan related to Tardive Dyskinesia and patient's adherence to plan as established by provider. . Advised patient to use her cane or walker at all time to help avoid falls; assessed for recent falls and or fall history, patient denies recent falls and is currently using her cane; assessed for home safety concerns, patient is having difficulty stepping into her bathtub and is need of grab bars; discussed patient lives in a Senior apartment and her landlord may accommodate with bathroom modifications . Provided education to patient re: HSE; discussed patient may benefit from additional in home PT/OT services and patient is aggreeable . Collaborated with PCP provider [name], FNP regarding referral for PT/OT evaluation including a HSE . Discussed plans with patient for ongoing care management follow up and provided patient with direct contact information for care management team  Patient Self Care Activities:  . Attends all scheduled provider appointments . Calls pharmacy for medication refills . Performs ADL's independently . Calls provider office for new concerns or questions . Unable to perform IADLs independently  Initial goal documentation    . "I would like more assistance in my home" (pt-stated)        Current Barriers:  . Financial constraints related to cost of caregiver services . ADL IADL limitations . Limited education about community resources to offer PCS services  Social Work Clinical Goal(s):  Judy [name] Over the next 45 days, patient will follow up with DSS as directed by SW to complete a Medicaid application . Over the next 60 days the patient will be more knowledgeable of community resources offering PCS services   CCM SW Interventions: Completed 07/31/2019 . Patient interviewed and appropriate assessments performed . Determined the patient is interested in caregiver resources to assist with Adl needs such as bathing . Assessed for patient eligibility to qualify for Medicaid - patient reports making "one thousand something" per month . Discussed opportunity to apply for Medicaid to access PCS services if eligible . Performed chart review to determined patient recently seen in clinic with her sister - requested opportunity to contact patients sister Reino Bellis to discuss Medicaid application steps. Patient declined and did not give verbal permission to speak with her sister . Mailed Medicaid application to the patients home . Advised the patient to review application and contact DSS for telephonic assistance if needed . Provided education on alternative programs such as PACE and [facility] in home aide program o Patient reports she is not interested in [place] o Patient not interested in  placement on in home aide wait list during today's call . Discussed opportunity to privately hire a caregiver - patient reports inability to cover cost of private duty caregiver . Scheduled follow up call to the patient over the next 6 weeks to assess progression of patient goal  CCM RN CM Interventions: 08/23/19 call completed with patient  . Discussed patient received th applications to apply for caregiver assistance; discussed she will need assistance with reading and completing the applications .  Collaborated with embedded [place] regarding patients need for assistance with completing the applications; discussed having in SW ordered to assist with completion of application and or consider having patient ask her family to assist . Sent in basket message to provider [name], FNP requesting a SW visit be added to PT orders   Patient Self Care Activities:  . Attends all scheduled provider appointments . Calls provider office for new concerns or questions . Unable to perform ADLs independently  Initial goal documentation       Other   . COMPLETED: Assist with Chronic Care Management and Care Coordination needs       Current Barriers:  Judy [name] Knowledge Barriers related to resources and support available to address needs related to Chronic disease management and Care Coordination needs  Case Manager Clinical Goal(s):  Judy [name] Over the next 30 days, patient will work with the CCM team to address needs related to Chronic Care Management and Community resources.   CCM RN CM Interventions:  08/23/19 call completed with patient  . See other goals  Patient Self Care Activities:  . Attends all scheduled provider appointments . Calls pharmacy for medication refills . Calls provider office for new concerns or questions  Initial goal documentation       Plan:   Telephone follow up appointment with care management team member scheduled for: 09/21/19  Barb Merino, RN, BSN, CCM Care Management Coordinator [facility] [facility]  Direct Phone: [phone]

## 2019-09-03 ENCOUNTER — Ambulatory Visit: Payer: Self-pay

## 2019-09-03 DIAGNOSIS — I10 Essential (primary) hypertension: Secondary | ICD-10-CM

## 2019-09-03 DIAGNOSIS — E782 Mixed hyperlipidemia: Secondary | ICD-10-CM

## 2019-09-03 DIAGNOSIS — Z741 Need for assistance with personal care: Secondary | ICD-10-CM

## 2019-09-03 NOTE — Patient Instructions (Signed)
Social Worker Visit Information  Goals we discussed today:  Goals Addressed            This Visit's Progress     Patient Stated   . COMPLETED: "I would like more assistance in my home" (pt-stated)       Current Barriers:  . Financial constraints related to cost of caregiver services . ADL IADL limitations . Limited education about community resources to offer PCS services  Social Work Clinical Goal(s):  [name] Over the next 45 days, patient will follow up with DSS as directed by SW to complete a Medicaid application . Over the next 60 days the patient will be more knowledgeable of community resources offering PCS services   CCM SW Interventions: Completed 09/03/2019 . Outbound call placed to the patient to assess progression of patient stated goal . Determined the patient has yet to initiate completion of Medicaid application this SW previously provided via mail . Discussed opportunity to contact [place] via phone to receive assistance with application o Patient stated " I'm not going to do that" . Assessed patient plan to overcome barriers and complete application to receive Medicaid  o Patient stated "I am not going to apply for it anymore. I don't want it" . Communication with RN Case Manager to update on goal closure based on the patients decision to no longer pursue   CCM RN CM Interventions: 08/23/19 call completed with patient  . Discussed patient received th applications to apply for caregiver assistance; discussed she will need assistance with reading and completing the applications . Collaborated with embedded [place] regarding patients need for assistance with completing the applications; discussed having in SW ordered to assist with completion of application and or consider having patient ask her family to assist . Sent in basket message to provider [name], FNP requesting a SW visit be added to PT orders   Patient Self Care Activities:   . Attends all scheduled provider appointments . Calls provider office for new concerns or questions . Unable to perform ADLs independently  Please see past updates related to this goal by clicking on the "Past Updates" button in the selected goal      . COMPLETED: "I would like to learn about transportation resources" (pt-stated)       Current Barriers:  . Limited education about resources offered within [facility] . Lack of interest in utilizing [facility] services . Limited support persons available to provide transportation to the patient   Social Work Clinical Goal(s):  [name] Over the next 45 days the patient will become more knowledgeable of Senior Wheels offered through [employer] [place]  CCM SW Interventions: Completed 09/03/2019 . Outbound call to the patient to assess progression of patient stated goal . Provided the patient with the contact number to [employer] [place] regarding desired enrollment o Patient reports she has an [place] appointment in December and may need transportation o Reviewed barriers to this resource including limited volunteers to provide assistance . Discussed alternative transportation resources available to the patient o The patient stated "I will not use [employer]" . Goal Met; SW has provided education on two separate occassions as well as education on how to Public affairs consultant Program  Patient Self Care Activities:  . Attends all scheduled provider appointments . Calls pharmacy for medication refills . Calls provider office for new concerns or questions  Please see past updates related to this goal by clicking on the "Past Updates" button  in the selected goal          Materials Provided: Verbal education about community resources provided by phone  Follow Up Plan: No follow up planned at this time. The patient is encouraged to contact SW with future resource needs.   Daneen Schick, BSW, CDP Social Worker, Certified  Dementia Practitioner [place] [facility] [place] [facility] [phone]

## 2019-09-03 NOTE — Chronic Care Management (AMB) (Signed)
Chronic Care Management    Social Work Follow Up Note  09/03/2019 Name: Judy [name] MRN: [id number] DOB: 1946/06/20  Judy [name] is a 73 y.o. year old female who is a primary care patient of [name], [place]. The CCM team was consulted for assistance with care coordination.   Review of patient status, including review of consultants reports, other relevant assessments, and collaboration with appropriate care team members and the patient's provider was performed as part of comprehensive patient evaluation and provision of chronic care management services.    SW placed an outbound call to the patient to assess progression of patient SW goals.  Outpatient Encounter Medications as of 09/03/2019  Medication Sig   amLODipine (NORVASC) 5 MG tablet Take 1 tablet (5 mg total) by mouth daily.   buPROPion (WELLBUTRIN XL) 150 MG 24 hr tablet Take 1 tablet (150 mg total) by mouth every morning.   cholecalciferol (VITAMIN D) 25 MCG (1000 UT) tablet Take 1,000 Units by mouth daily.   gabapentin (NEURONTIN) 300 MG capsule Take 1 capsule (300 mg total) by mouth 3 (three) times daily.   LATUDA 120 MG TABS TAKE 1 TABLET BY MOUTH EVERY DAY WITH EVENING MEAL   meclizine (ANTIVERT) 25 MG tablet Take 1 tablet (25 mg total) by mouth 3 (three) times daily as needed for dizziness.   Multiple Vitamin (DAILY VITE PO) Take 1 tablet by mouth daily.   Valbenazine Tosylate (INGREZZA) 80 MG CAPS Take 80 mg by mouth daily. (Patient taking differently: Take 40 mg by mouth daily. )   Vitamin D, Ergocalciferol, (DRISDOL) 1.25 MG (50000 UT) CAPS capsule Take 1 capsule (50,000 Units total) by mouth every 7 (seven) days.   No facility-administered encounter medications on file as of 09/03/2019.      Goals Addressed            This Visit's Progress     Patient Stated    COMPLETED: "I would like more assistance in my home" (pt-stated)       Current Barriers:   Financial constraints related to cost  of caregiver services  ADL IADL limitations  Limited education about community resources to offer PCS services  Social Work Clinical Goal(s):   Over the next 45 days, patient will follow up with DSS as directed by SW to complete a Medicaid application  Over the next 60 days the patient will be more knowledgeable of community resources offering PCS services   CCM SW Interventions: Completed 09/03/2019  Outbound call placed to the patient to assess progression of patient stated goal  Determined the patient has yet to initiate completion of Medicaid application this SW previously provided via mail  Discussed opportunity to contact [place] via phone to receive assistance with application o Patient stated " I'm not going to do that"  Assessed patient plan to overcome barriers and complete application to receive Medicaid  o Patient stated "I am not going to apply for it anymore. I don't want it"  Communication with RN Case Manager to update on goal closure based on the patients decision to no longer pursue   CCM RN CM Interventions: 08/23/19 call completed with patient   Discussed patient received th applications to apply for caregiver assistance; discussed she will need assistance with reading and completing the applications  Collaborated with embedded [place] regarding patients need for assistance with completing the applications; discussed having in SW ordered to assist with completion of application and or consider having patient  ask her family to assist  Sent in basket message to provider [name], FNP requesting a SW visit be added to PT orders   Patient Self Care Activities:   Attends all scheduled provider appointments  Calls provider office for new concerns or questions  Unable to perform ADLs independently  Please see past updates related to this goal by clicking on the "Past Updates" button in the selected goal       COMPLETED: "I would  like to learn about transportation resources" (pt-stated)       Current Barriers:   Limited education about resources offered within [place] of interest in [place] support persons available to provide transportation to the patient   Social Work Clinical Goal(s):   Over the next 45 days the patient will become more knowledgeable of Armed forces technical officer offered through [employer] [facility] [place] SW Interventions: Completed 09/03/2019  Outbound call to the patient to assess progression of patient stated goal  Provided the patient with the contact number to [employer] regarding desired enrollment o Patient reports she has an [place] appointment in December and may need transportation o Reviewed barriers to this resource including limited volunteers to provide assistance  Discussed alternative transportation resources available to the patient o The patient stated "I will not use [employer]"  Goal Met; SW has provided education on two separate occassions as well as education on how to access [employer]  Patient Self Care Activities:   Attends all scheduled provider appointments  Calls pharmacy for medication refills  Calls provider office for new concerns or questions  Please see past updates related to this goal by clicking on the "Past Updates" button in the selected goal          Follow Up Plan: No further SW follow up planned at this time. The patient will remain active with RN Case Manager. The patient is encouraged to contact SW with any future resource needs.   Daneen Schick, BSW, CDP Social Worker, Certified Dementia Practitioner [place] [facility] Management [phone]  Total time spent performing care coordination and/or care management activities with the patient by phone or face to face = 9 minutes.

## 2019-09-05 ENCOUNTER — Ambulatory Visit (INDEPENDENT_AMBULATORY_CARE_PROVIDER_SITE_OTHER): Payer: Medicare HMO | Admitting: Physician Assistant

## 2019-09-05 ENCOUNTER — Other Ambulatory Visit: Payer: Self-pay

## 2019-09-05 ENCOUNTER — Encounter: Payer: Self-pay | Admitting: Physician Assistant

## 2019-09-05 DIAGNOSIS — F3130 Bipolar disorder, current episode depressed, mild or moderate severity, unspecified: Secondary | ICD-10-CM | POA: Diagnosis not present

## 2019-09-05 DIAGNOSIS — E559 Vitamin D deficiency, unspecified: Secondary | ICD-10-CM

## 2019-09-05 DIAGNOSIS — G2401 Drug induced subacute dyskinesia: Secondary | ICD-10-CM

## 2019-09-05 DIAGNOSIS — F411 Generalized anxiety disorder: Secondary | ICD-10-CM

## 2019-09-05 DIAGNOSIS — R69 Illness, unspecified: Secondary | ICD-10-CM | POA: Diagnosis not present

## 2019-09-05 MED ORDER — VITAMIN D3 25 MCG (1000 UNIT) PO TABS: 1000.0000 [IU] | ORAL_TABLET | Freq: Every day | ORAL | 11 refills | Status: DC

## 2019-09-05 MED ORDER — INGREZZA 40 MG PO CAPS: 40.0000 mg | ORAL_CAPSULE | Freq: Every day | ORAL | 5 refills | Status: DC

## 2019-09-05 NOTE — Progress Notes (Signed)
[facility] Check  Patient ID: Judy [name],  MRN: [id number]  PCP: [name], FNP  Date of Evaluation: 09/05/2019 Time spent:15 minutes  Chief Complaint:  Chief Complaint    Follow-up     Virtual Visit via Telephone Note  I connected with patient by a video enabled telemedicine application or telephone, with their informed consent, and verified patient privacy and that I am speaking with the correct person using two identifiers.  I am private, in my office and the patient is home.  I discussed the limitations, risks, security and privacy concerns of performing an evaluation and management service by telephone and the availability of in person appointments. I also discussed with the patient that there may be a patient responsible charge related to this service. The patient expressed understanding and agreed to proceed.   I discussed the assessment and treatment plan with the patient. The patient was provided an opportunity to ask questions and all were answered. The patient agreed with the plan and demonstrated an understanding of the instructions.   The patient was advised to call back or seek an in-person evaluation if the symptoms worsen or if the condition fails to improve as anticipated.  I provided 15 minutes of non-face-to-face time during this encounter.  HISTORY/CURRENT STATUS: HPI for follow-up after starting [place]rez[place].  Approximately 1 month ago, we started [place].  The patient was unable to get a ride to the office today so I am unable to observe her movements, however she states she is a lot better.  "The shaking is almost completely gone."  Reports that the clenching of her jaws has disappeared.  "I feel a lot better.  I am not moving all the time like I was."  States that her family has also noticed an improvement in the abnormal movements.  States she is doing well emotionally.  Denies being depressed.  She does enjoy watching TV.  Energy and motivation  are fair but unchanged.  No suicidal or homicidal thoughts.  Patient denies increased energy with decreased need for sleep, no increased talkativeness, no racing thoughts, no impulsivity or risky behaviors, no increased spending, no increased libido, no grandiosity.  Denies hallucinations.  She has recently started on vitamin D 50,000 IUs weekly because of low D level.  She is tolerating that fine.  Denies dizziness, syncope, seizures, numbness, tingling, tremor, tics, unsteady gait, slurred speech, confusion. Denies muscle or joint pain, stiffness, or dystonia.  Individual Medical History/ Review of Systems: Changes? :No    Past medications for mental health diagnoses include: Latuda, Seroquel, Prozac, Fetzima, Risperdal, gabapentin, Wellbutrin  Allergies: Patient has no known allergies.  Current Medications:  Current Outpatient Medications:  .  amLODipine (NORVASC) 5 MG tablet, Take 1 tablet (5 mg total) by mouth daily., Disp: 90 tablet, Rfl: 1 .  buPROPion (WELLBUTRIN XL) 150 MG 24 hr tablet, Take 1 tablet (150 mg total) by mouth every morning., Disp: 30 tablet, Rfl: 5 .  cholecalciferol (VITAMIN D) 25 MCG (1000 UT) tablet, Take 1 tablet (1,000 Units total) by mouth daily., Disp: 60 tablet, Rfl: 11 .  gabapentin (NEURONTIN) 300 MG capsule, Take 1 capsule (300 mg total) by mouth 3 (three) times daily., Disp: 90 capsule, Rfl: 5 .  LATUDA 120 MG TABS, TAKE 1 TABLET BY MOUTH EVERY DAY WITH EVENING MEAL, Disp: 30 tablet, Rfl: 2 .  meclizine (ANTIVERT) 25 MG tablet, Take 1 tablet (25 mg total) by mouth 3 (three) times daily as needed for dizziness., Disp: 15  tablet, Rfl: 0 .  Multiple Vitamin (DAILY VITE PO), Take 1 tablet by mouth daily., Disp: , Rfl:  .  Vitamin D, Ergocalciferol, (DRISDOL) 1.25 MG (50000 UT) CAPS capsule, Take 1 capsule (50,000 Units total) by mouth every 7 (seven) days., Disp: 12 capsule, Rfl: 0 .  Valbenazine Tosylate (INGREZZA) 40 MG CAPS, Take 40 mg by mouth daily.,  Disp: 30 capsule, Rfl: 5 Medication Side Effects: none  Family Medical/ Social History: Changes? No  MENTAL HEALTH EXAM:  There were no vitals taken for this visit.There is no height or weight on file to calculate BMI.  General Appearance: Unable to assess  Eye Contact:  Unable to assess  Speech:  Clear and Coherent  Volume:  Normal  Mood:  Euthymic  Affect:  Unable to assess  Thought Process:  Goal Directed and Descriptions of Associations: Intact  Orientation:  Full (Time, Place, and Person)  Thought Content: Logical   Suicidal Thoughts:  No  Homicidal Thoughts:  No  Memory:  WNL  Judgement:  Good  Insight:  Good  Psychomotor Activity:  Unable to assess  Concentration:  Concentration: Good  Recall:  Good  Fund of Knowledge: Good  Language: Good  Assets:  Desire for Improvement  ADL's:  Intact  Cognition: WNL  Prognosis:  Good    DIAGNOSES:    ICD-10-CM   1. Tardive dyskinesia  G24.01   2. Hypovitaminosis D  E55.9   3. Generalized anxiety disorder  F41.1   4. Bipolar I disorder, most recent episode depressed ([place])  F31.30     Receiving Psychotherapy: No    RECOMMENDATIONS:  I am glad to hear that she is doing so much better!  I really need her to be seen in the office at the next visit so I can observe her movements.  She verbalizes understanding and will do her best to get a ride to get here. Continue Wellbutrin XL 150 mg p.o. every morning. Increase vitamin D to 2000 IUs daily. Continue vitamin D 50,000 IUs weekly. Continue gabapentin 300 mg 1 3 times daily. Continue Latuda 120 mg q. evening with food. Continue Ingrezza 40 mg daily. We will need to have recheck vitamin D level at the next visit. Return in 4 to 6 weeks, in office.   Donnal Moat, PA-C

## 2019-09-10 ENCOUNTER — Telehealth: Payer: Self-pay | Admitting: Nurse Practitioner

## 2019-09-10 NOTE — Telephone Encounter (Signed)
I left a message asking the patient to call and reschedule 12/23  AWV & CPE.

## 2019-09-16 ENCOUNTER — Other Ambulatory Visit: Payer: Self-pay | Admitting: Physician Assistant

## 2019-09-18 ENCOUNTER — Other Ambulatory Visit: Payer: Self-pay

## 2019-09-18 ENCOUNTER — Ambulatory Visit (INDEPENDENT_AMBULATORY_CARE_PROVIDER_SITE_OTHER): Payer: Medicare HMO

## 2019-09-18 VITALS — Ht 70.0 in | Wt 169.0 lb

## 2019-09-18 DIAGNOSIS — Z0000 Encounter for general adult medical examination without abnormal findings: Secondary | ICD-10-CM

## 2019-09-18 NOTE — Patient Instructions (Signed)
Judy [name] , Thank you for taking time to come for your Medicare Wellness Visit. I appreciate your ongoing commitment to your health goals. Please review the following plan we discussed and let me know if I can assist you in the future.   Screening recommendations/referrals: Colonoscopy: 08/2011 Mammogram: 10/2017 Bone Density: 03/2013 Recommended yearly ophthalmology/optometry visit for glaucoma screening and checkup Recommended yearly dental visit for hygiene and checkup  Vaccinations: Influenza vaccine: 06/2019 Pneumococcal vaccine: declined Prevnar 13 Tdap vaccine: declined Shingles vaccine: discussed    Advanced directives: copy in chart  Conditions/risks identified: HTN  Next appointment: 10/03/2019 at 2:45   Preventive Care 70 Years and Older, Female Preventive care refers to lifestyle choices and visits with your health care provider that can promote health and wellness. What does preventive care include?  A yearly physical exam. This is also called an annual well check.  Dental exams once or twice a year.  Routine eye exams. Ask your health care provider how often you should have your eyes checked.  Personal lifestyle choices, including:  Daily care of your teeth and gums.  Regular physical activity.  Eating a healthy diet.  Avoiding tobacco and drug use.  Limiting alcohol use.  Practicing safe sex.  Taking low-dose aspirin every day.  Taking vitamin and mineral supplements as recommended by your health care provider. What happens during an annual well check? The services and screenings done by your health care provider during your annual well check will depend on your age, overall health, lifestyle risk factors, and family history of disease. Counseling  Your health care provider may ask you questions about your:  Alcohol use.  Tobacco use.  Drug use.  Emotional well-being.  Home and relationship well-being.  Sexual activity.  Eating habits.   History of falls.  Memory and ability to understand (cognition).  Work and work Statistician.  Reproductive health. Screening  You may have the following tests or measurements:  Height, weight, and BMI.  Blood pressure.  Lipid and cholesterol levels. These may be checked every 5 years, or more frequently if you are over 39 years old.  Skin check.  Lung cancer screening. You may have this screening every year starting at age 31 if you have a 30-pack-year history of smoking and currently smoke or have quit within the past 15 years.  Fecal occult blood test (FOBT) of the stool. You may have this test every year starting at age 43.  Flexible sigmoidoscopy or colonoscopy. You may have a sigmoidoscopy every 5 years or a colonoscopy every 10 years starting at age 55.  Hepatitis C blood test.  Hepatitis B blood test.  Sexually transmitted disease (STD) testing.  Diabetes screening. This is done by checking your blood sugar (glucose) after you have not eaten for a while (fasting). You may have this done every 1-3 years.  Bone density scan. This is done to screen for osteoporosis. You may have this done starting at age 84.  Mammogram. This may be done every 1-2 years. Talk to your health care provider about how often you should have regular mammograms. Talk with your health care provider about your test results, treatment options, and if necessary, the need for more tests. Vaccines  Your health care provider may recommend certain vaccines, such as:  Influenza vaccine. This is recommended every year.  Tetanus, diphtheria, and acellular pertussis (Tdap, Td) vaccine. You may need a Td booster every 10 years.  Zoster vaccine. You may need this after age 74.  Pneumococcal 13-valent conjugate (PCV13) vaccine. One dose is recommended after age 21.  Pneumococcal polysaccharide (PPSV23) vaccine. One dose is recommended after age 71. Talk to your health care provider about which  screenings and vaccines you need and how often you need them. This information is not intended to replace advice given to you by your health care provider. Make sure you discuss any questions you have with your health care provider. Document Released: 10/24/2015 Document Revised: 06/16/2016 Document Reviewed: 07/29/2015 Elsevier Interactive Patient Education  2017 [place] Prevention in the Home Falls can cause injuries. They can happen to people of all ages. There are many things you can do to make your home safe and to help prevent falls. What can I do on the outside of my home?  Regularly fix the edges of walkways and driveways and fix any cracks.  Remove anything that might make you trip as you walk through a door, such as a raised step or threshold.  Trim any bushes or trees on the path to your home.  Use bright outdoor lighting.  Clear any walking paths of anything that might make someone trip, such as rocks or tools.  Regularly check to see if handrails are loose or broken. Make sure that both sides of any steps have handrails.  Any raised decks and porches should have guardrails on the edges.  Have any leaves, snow, or ice cleared regularly.  Use sand or salt on walking paths during winter.  Clean up any spills in your garage right away. This includes oil or grease spills. What can I do in the bathroom?  Use night lights.  Install grab bars by the toilet and in the tub and shower. Do not use towel bars as grab bars.  Use non-skid mats or decals in the tub or shower.  If you need to sit down in the shower, use a plastic, non-slip stool.  Keep the floor dry. Clean up any water that spills on the floor as soon as it happens.  Remove soap buildup in the tub or shower regularly.  Attach bath mats securely with double-sided non-slip rug tape.  Do not have throw rugs and other things on the floor that can make you trip. What can I do in the bedroom?  Use  night lights.  Make sure that you have a light by your bed that is easy to reach.  Do not use any sheets or blankets that are too big for your bed. They should not hang down onto the floor.  Have a firm chair that has side arms. You can use this for support while you get dressed.  Do not have throw rugs and other things on the floor that can make you trip. What can I do in the kitchen?  Clean up any spills right away.  Avoid walking on wet floors.  Keep items that you use a lot in easy-to-reach places.  If you need to reach something above you, use a strong step stool that has a grab bar.  Keep electrical cords out of the way.  Do not use floor polish or wax that makes floors slippery. If you must use wax, use non-skid floor wax.  Do not have throw rugs and other things on the floor that can make you trip. What can I do with my stairs?  Do not leave any items on the stairs.  Make sure that there are handrails on both sides of the stairs and use them.  Fix handrails that are broken or loose. Make sure that handrails are as long as the stairways.  Check any carpeting to make sure that it is firmly attached to the stairs. Fix any carpet that is loose or worn.  Avoid having throw rugs at the top or bottom of the stairs. If you do have throw rugs, attach them to the floor with carpet tape.  Make sure that you have a light switch at the top of the stairs and the bottom of the stairs. If you do not have them, ask someone to add them for you. What else can I do to help prevent falls?  Wear shoes that:  Do not have high heels.  Have rubber bottoms.  Are comfortable and fit you well.  Are closed at the toe. Do not wear sandals.  If you use a stepladder:  Make sure that it is fully opened. Do not climb a closed stepladder.  Make sure that both sides of the stepladder are locked into place.  Ask someone to hold it for you, if possible.  Clearly mark and make sure that you  can see:  Any grab bars or handrails.  First and last steps.  Where the edge of each step is.  Use tools that help you move around (mobility aids) if they are needed. These include:  Canes.  Walkers.  Scooters.  Crutches.  Turn on the lights when you go into a dark area. Replace any light bulbs as soon as they burn out.  Set up your furniture so you have a clear path. Avoid moving your furniture around.  If any of your floors are uneven, fix them.  If there are any pets around you, be aware of where they are.  Review your medicines with your doctor. Some medicines can make you feel dizzy. This can increase your chance of falling. Ask your doctor what other things that you can do to help prevent falls. This information is not intended to replace advice given to you by your health care provider. Make sure you discuss any questions you have with your health care provider. Document Released: 07/24/2009 Document Revised: 03/04/2016 Document Reviewed: 11/01/2014 Elsevier Interactive Patient Education  2017 [employer].

## 2019-09-18 NOTE — Progress Notes (Signed)
This visit type was conducted due to national recommendations for restrictions regarding the COVID-19 Pandemic (e.g. social distancing). This format is felt to be most appropriate for this patient at this time. All issues noted in this document were discussed and addressed. No physical exam was performed (except for noted visual exam findings with Video Visits). This patient, [name], has given permission to perform this visit via telephone. Vital signs may be absent or patient reported.  Patient location:  At home  Nurse location:  [facility] office    Subjective:   Judy [name] is a 73 y.o. female who presents for Medicare Annual (Subsequent) preventive examination.  Review of Systems:  n/a Cardiac Risk Factors include: advanced age (>65men, >14 women);hypertension     Objective:     Vitals: Ht 5\' 10"  (1.778 m) Comment: per patient  Wt 169 lb (76.7 kg) Comment: per patient  BMI 24.25 kg/m   Body mass index is 24.25 kg/m.  Advanced Directives 09/18/2019 07/31/2019 06/14/2019 06/13/2019 07/11/2018 07/06/2017 06/27/2017  Does Patient Have a Medical Advance Directive? Yes No No No No Yes No  Type of Advance Directive [place] -  Does patient want to make changes to medical advance directive? - - - - - No - Patient declined -  Copy of [place] in Chart? Yes - validated most recent copy scanned in chart (See row information) - - - - Yes -  Would patient like information on creating a medical advance directive? - Yes (MAU/Ambulatory/Procedural Areas - Information given) No - Patient declined - - - -  Pre-existing out of facility DNR order (yellow form or pink MOST form) - - - - - - -    Tobacco Social History   Tobacco Use  Smoking Status Never Smoker  Smokeless Tobacco Never Used     Counseling given: Not Answered   Clinical Intake:  Pre-visit preparation completed: Yes  Pain : 0-10 Pain Score:  8  Pain Type: Acute pain Pain Location: Leg Pain Orientation: Right Pain Radiating Towards: in thigh Pain Descriptors / Indicators: Aching Pain Onset: More than a month ago Pain Frequency: Intermittent Pain Relieving Factors: APAP helps some  Pain Relieving Factors: APAP helps some  Nutritional Status: BMI of 19-24  Normal Nutritional Risks: None Diabetes: No  How often do you need to have someone help you when you read instructions, pamphlets, or other written materials from your doctor or pharmacy?: 1 - Never What is the last grade level you completed in school?: 12th grade  Interpreter Needed?: No  Information entered by :: NAllen LPN  Past Medical History:  Diagnosis Date  . Allergic rhinitis   . Anemia   . Anxiety   . Arthritis   . Bipolar 1 disorder ([place])   . Depression   . Diverticulosis of colon (without mention of hemorrhage) 08/25/2011   [name]  . 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 Hyperlipidemia   . Hypertension   . Obesity   . Renal disorder   . Vertigo    Past Surgical History:  Procedure Laterality Date  . COLONOSCOPY  08/25/2011   Procedure: COLONOSCOPY;  Surgeon: Landry Dyke, MD;  Location: [facility];  Service: Endoscopy;  Laterality: N/A;  . DILATION AND CURETTAGE OF UTERUS  1960's   Family History  Problem Relation Age of Onset  . Prostate cancer Brother   . Hypertension  Brother   . Kidney disease Brother   . Colon cancer Brother   . Hypertension Mother   . Kidney disease Mother   . Stomach cancer Father   . Hyperlipidemia Sister   . Hypothyroidism Sister   . Stroke Brother   . Prostate cancer Brother    Social History   Socioeconomic History  . Marital status: Single    Spouse name: Not on file  . Number of children: 1  . Years of education: 67  . Highest education level: Not on file  Occupational History  . Occupation: Retired  Scientific laboratory technician  . Financial resource strain: Not hard at all  . Food insecurity     Worry: Never true    Inability: Never true  . Transportation needs    Medical: No    Non-medical: No  Tobacco Use  . Smoking status: Never Smoker  . Smokeless tobacco: Never Used  Substance and Sexual Activity  . Alcohol use: No  . Drug use: No  . Sexual activity: Not Currently  Lifestyle  . Physical activity    Days per week: 3 days    Minutes per session: 20 min  . Stress: Not at all  Relationships  . Social Herbalist on phone: Not on file    Gets together: Not on file    Attends religious service: Not on file    Active member of club or organization: Not on file    Attends meetings of clubs or organizations: Not on file    Relationship status: Not on file  Other Topics Concern  . Not on file  Social History Narrative   Lives alone in house, does not need assist device.  Works part time at [employer]. Has a son.     Cousin and sister in close proximity   Right handed   2 cups coffee per day    Outpatient Encounter Medications as of 09/18/2019  Medication Sig  . amLODipine (NORVASC) 5 MG tablet Take 1 tablet (5 mg total) by mouth daily.  [name] buPROPion (WELLBUTRIN XL) 150 MG 24 hr tablet TAKE 1 TABLET BY MOUTH EVERY DAY IN THE MORNING  . cholecalciferol (VITAMIN D) 25 MCG (1000 UT) tablet Take 1 tablet (1,000 Units total) by mouth daily.  [name] gabapentin (NEURONTIN) 300 MG capsule Take 1 capsule (300 mg total) by mouth 3 (three) times daily.  [name] LATUDA 120 MG TABS TAKE 1 TABLET BY MOUTH EVERY DAY WITH EVENING MEAL  . meclizine (ANTIVERT) 25 MG tablet Take 1 tablet (25 mg total) by mouth 3 (three) times daily as needed for dizziness.  . Multiple Vitamin (DAILY VITE PO) Take 1 tablet by mouth daily.  [place] Tosylate (INGREZZA) 40 MG CAPS Take 40 mg by mouth daily.  . Vitamin D, Ergocalciferol, (DRISDOL) 1.25 MG (50000 UT) CAPS capsule Take 1 capsule (50,000 Units total) by mouth every 7 (seven) days.   No facility-administered encounter  medications on file as of 09/18/2019.     Activities of Daily Living In your present state of health, do you have any difficulty performing the following activities: 09/18/2019 06/14/2019  Hearing? Y N  Comment a little, decreased in the left ear -  Vision? Y N  Comment with reading, but uses reading glasses -  Difficulty concentrating or making decisions? N N  Walking or climbing stairs? Y N  Dressing or bathing? Y N  Comment has trouble getting in shower, does wash ups -  Doing errands, shopping? Y -  Comment has granddaughter with her -  Conservation officer, nature and eating ? N -  Using the Toilet? N -  In the past six months, have you accidently leaked urine? Y -  Comment leakage at night -  Do you have problems with loss of bowel control? N -  Managing your Medications? N -  Managing your Finances? N -  Housekeeping or managing your Housekeeping? N -  Some recent data might be hidden    Patient Care Team: [name], FNP as PCP - General ([place]) Kristeen Miss, MD as Consulting Physician (Neurosurgery) Cottle, Billey Co., MD as Attending Physician (Psychiatry) Daneen Schick as Social Worker Little, Claudette Stapler, RN as Case Manager    Assessment:   This is a routine wellness examination for [place].  Exercise Activities and Dietary recommendations Current Exercise Habits: Home exercise routine, Type of exercise: walking, Time (Minutes): 20, Frequency (Times/Week): 3, Weekly Exercise (Minutes/Week): 60  Goals    . "I am not taking anything for my cholesterol" (pt-stated)     Current Barriers:  [name] Knowledge Deficits related to diagnosis and treatment of Hyperlipidemia  Nurse Case Manager Clinical Goal(s):  [name] Over the next 30 days, patient will work with PCP and embedded Pharm D to address needs related to pharmacological management of Hyperlipidemia . Over the next 90 days, patient will verbalize basic understanding of Hyperlipidemia disease process and self health management  plan as evidenced by patient will lower her total Cholesterol and LDL target range   CCM RN CM Interventions:  08/23/19 call completed with patient  . Evaluation of current treatment plan related to Hyperlipidemia and patient's adherence to plan as established by provider. . Provided education to patient re: most recent total Cholesterol of 222 and LDL 123 obtained on 02/26/19; discussed target ranges; patient was unaware; education provided related to this disease process and treatment recommendations  . Reviewed medications with patient and discussed patient has not been prescribed pharmacological treatment for this conditon  . Collaborated with provider  regarding recommendations for start of treatment for Hyperlipidemia . Discussed plans with patient for ongoing care management follow up and provided patient with direct contact information for care management team . Provided patient with printed educational materials related to Hyperlipdemia  Patient Self Care Activities:  . Self administers medications as prescribed . Attends all scheduled provider appointments . Calls pharmacy for medication refills . Performs ADL's independently . Calls provider office for new concerns or questions . Unable to perform IADLs independently  Initial goal documentation     . "I need some grab bars in my bathroom" (pt-stated)     Current Barriers:  . Home Safety Concerns . Impaired Physical Mobility secondary to Tardive Dyskinesia . Self Care Deficit  Nurse Case Manager Clinical Goal(s):  [name] Over the next 30 days, patient will work with the CCM team to address needs related to home safety concerns  CCM RN CM Interventions:  08/23/19 call completed with patient  . Evaluation of current treatment plan related to Impaired physical mobility and patient's adherence to plan as established by provider. . Advised patient to use her cane or walker at all time to help avoid falls; assessed for recent falls  and or fall history, patient denies recent falls and is currently using her cane; assessed for home safety concerns, patient is having difficulty stepping into her bathtub and is need of grab bars; discussed patient lives in a Senior apartment and her  landlord may accommodate with bathroom modifications . Provided education to patient re: HSE; discussed patient may benefit from additional in home PT/OT services and patient is aggreeable . Collaborated with PCP provider [name], FNP regarding referral for PT/OT evaluation including a HSE . Discussed plans with patient for ongoing care management follow up and provided patient with direct contact information for care management team  Patient Self Care Activities:  . Attends all scheduled provider appointments . Calls pharmacy for medication refills . Performs ADL's independently . Calls provider office for new concerns or questions . Unable to perform IADLs independently  Initial goal documentation     . Collaborate witn RN Case Manager to perform appropriate assessments to determined care management and care coordination needs     Current Barriers:  [name] Knowledge barriers related to the independent self-health management of chronic conditions including HTN  Social Work Clinical Goal(s):  [name] Over the next 30 days the patient will work with RN Case Manager to establish an individualized plan of care related to the management of patients identified chronic conditions  CCM SW Interventions: . Patient interviewed and appropriate assessments performed . Assessed understanding of current chronic medical conditions. The patient identifies as having HTN . Determined the patient does not monitor BP readings within the home statin "I just take a pill everyday" . Assessed for patient difficulty affording medications - The patient identifies no difficulty . Collaboration with RN Case Manager regarding patient enrollment in program  Patient Self Care  Activities:  . Self administers medications as prescribed . Attends all scheduled provider appointments . Calls provider office for new concerns or questions  Initial goal documentation     . Exercise 3x per week (30 min per time)     09/18/2019, would like to work up to 30 minutes per session       Fall Risk Fall Risk  09/18/2019 07/02/2019 02/26/2019 07/24/2018 07/11/2018  Falls in the past year? 0 1 0 Yes Yes  Number falls in past yr: - 1 - 1 -  Injury with Fall? - 0 - No No  Comment - - - - -  Risk for fall due to : Medication side effect - - - Impaired balance/gait  Follow up Falls evaluation completed;Education provided;Falls prevention discussed - - - -   Is the patient's home free of loose throw rugs in walkways, pet beds, electrical cords, etc?   yes      Grab bars in the bathroom? yes      Handrails on the stairs?   n/a      Adequate lighting?   yes  Timed Get Up and Go performed: n/a  Depression Screen PHQ 2/9 Scores 09/18/2019 07/02/2019 02/26/2019 07/24/2018  PHQ - 2 Score 0 6 0 1  PHQ- 9 Score 2 23 - 2     Cognitive Function MMSE - Mini Mental State Exam 07/06/2017 03/25/2017 06/04/2014  Orientation to time 5 5 5   Orientation to Place 5 5 5   Registration 3 3 3   Attention/ Calculation 4 4 5   Recall 1 2 2   Language- name 2 objects 2 2 2   Language- repeat 1 1 1   Language- follow 3 step command 3 3 3   Language- read & follow direction 1 1 1   Write a sentence 1 1 1   Copy design 1 0 0  Total score 27 27 28      6CIT Screen 09/18/2019  What Year? 0 points  What month? 0 points  What time?  0 points  Count back from 20 0 points  Months in reverse 0 points  Repeat phrase 0 points  Total Score 0    Immunization History  Administered Date(s) Administered  . Influenza, High Dose Seasonal PF 07/24/2018, 07/02/2019  . Influenza,inj,Quad PF,6+ Mos 12/11/2013, 09/12/2015, 07/20/2016, 08/10/2017  . Pneumococcal Polysaccharide-23 12/11/2013    Qualifies for Shingles  Vaccine? yes  Screening Tests Health Maintenance  Topic Date Due  . TETANUS/TDAP  09/17/2020 (Originally 07/09/1965)  . PNA vac Low Risk Adult (2 of 2 - PCV13) 09/17/2020 (Originally 12/12/2014)  . MAMMOGRAM  11/09/2019  . COLONOSCOPY  08/24/2021  . INFLUENZA VACCINE  Completed  . DEXA SCAN  Completed  . Hepatitis C Screening  Completed    Cancer Screenings: Lung: Low Dose CT Chest recommended if Age 40-80 years, 30 pack-year currently smoking OR have quit w/in 15years. Patient does not qualify. Breast:  Up to date on Mammogram? Yes   Up to date of Bone Density/Dexa? Yes Colorectal: up to date  Additional Screenings: : Hepatitis C Screening: 02/26/2019     Plan:    Patient would like to increase her exercise sessions to 30 minutes from 20.   I have personally reviewed and noted the following in the patient's chart:   . Medical and social history . Use of alcohol, tobacco or illicit drugs  . Current medications and supplements . Functional ability and status . Nutritional status . Physical activity . Advanced directives . List of other physicians . Hospitalizations, surgeries, and ER visits in previous 12 months . Vitals . Screenings to include cognitive, depression, and falls . Referrals and appointments  In addition, I have reviewed and discussed with patient certain preventive protocols, quality metrics, and best practice recommendations. A written personalized care plan for preventive services as well as general preventive health recommendations were provided to patient.     Kellie Simmering, LPN  26/01/1582

## 2019-09-21 ENCOUNTER — Telehealth: Payer: Self-pay

## 2019-09-24 ENCOUNTER — Ambulatory Visit: Payer: Self-pay

## 2019-09-24 DIAGNOSIS — Z741 Need for assistance with personal care: Secondary | ICD-10-CM

## 2019-09-24 DIAGNOSIS — I10 Essential (primary) hypertension: Secondary | ICD-10-CM

## 2019-09-24 NOTE — Chronic Care Management (AMB) (Signed)
  Chronic Care Management   Outreach Note  09/24/2019 Name: Judy [name] MRN: [id number] DOB: 1946/04/12  Referred by: [name], FNP Reason for referral : Care Coordination   SW placed an unsuccessful outbound call to the patient in an attempt to review goal progression and assist with care coordination needs. SW left a HIPAA compliant voice message requesting a return call.  Follow Up Plan: The care management team will reach out to the patient again over the next 21 days.   Daneen Schick, BSW, CDP Social Worker, Certified Dementia Practitioner [place] [facility] [phone]

## 2019-09-28 DIAGNOSIS — M1990 Unspecified osteoarthritis, unspecified site: Secondary | ICD-10-CM | POA: Diagnosis not present

## 2019-09-28 DIAGNOSIS — F419 Anxiety disorder, unspecified: Secondary | ICD-10-CM | POA: Diagnosis not present

## 2019-09-28 DIAGNOSIS — I10 Essential (primary) hypertension: Secondary | ICD-10-CM | POA: Diagnosis not present

## 2019-09-28 DIAGNOSIS — K5900 Constipation, unspecified: Secondary | ICD-10-CM | POA: Diagnosis not present

## 2019-09-28 DIAGNOSIS — G629 Polyneuropathy, unspecified: Secondary | ICD-10-CM | POA: Diagnosis not present

## 2019-09-28 DIAGNOSIS — R32 Unspecified urinary incontinence: Secondary | ICD-10-CM | POA: Diagnosis not present

## 2019-09-28 DIAGNOSIS — R69 Illness, unspecified: Secondary | ICD-10-CM | POA: Diagnosis not present

## 2019-10-03 ENCOUNTER — Encounter: Payer: Medicare HMO | Admitting: Nurse Practitioner

## 2019-10-08 ENCOUNTER — Ambulatory Visit: Payer: Self-pay

## 2019-10-08 DIAGNOSIS — I10 Essential (primary) hypertension: Secondary | ICD-10-CM

## 2019-10-08 DIAGNOSIS — Z741 Need for assistance with personal care: Secondary | ICD-10-CM

## 2019-10-09 NOTE — Patient Instructions (Signed)
Social Worker Visit Information  Goals we discussed today:  Goals Addressed            This Visit's Progress     Patient Stated   . "I am not taking anything for my cholesterol" (pt-stated)       Current Barriers:  [name] Knowledge Deficits related to diagnosis and treatment of Hyperlipidemia  Nurse Case Manager Clinical Goal(s):  [name] Over the next 30 days, patient will work with PCP and embedded Pharm D to address needs related to pharmacological management of Hyperlipidemia . Over the next 90 days, patient will verbalize basic understanding of Hyperlipidemia disease process and self health management plan as evidenced by patient will lower her total Cholesterol and LDL target range   CCM SW Interventions: Completed 10/08/2019 . Outbound call to the patient to assist with care coordination . Concluded the patient did receive mailed educational materials but is having difficulty understanding materials . Advised the patient SW would collaborate with RN Case Manager to request follow up . Collaboration with RN Case Manager requesting patient outreach  CCM RN CM Interventions:  08/23/19 call completed with patient  . Evaluation of current treatment plan related to Hyperlipidemia and patient's adherence to plan as established by provider. . Provided education to patient re: most recent total Cholesterol of 222 and LDL 123 obtained on 02/26/19; discussed target ranges; patient was unaware; education provided related to this disease process and treatment recommendations  . Reviewed medications with patient and discussed patient has not been prescribed pharmacological treatment for this conditon  . Collaborated with provider  regarding recommendations for start of treatment for Hyperlipidemia . Discussed plans with patient for ongoing care management follow up and provided patient with direct contact information for care management team . Provided patient with printed educational materials  related to Hyperlipdemia  Patient Self Care Activities:  . Self administers medications as prescribed . Attends all scheduled provider appointments . Calls pharmacy for medication refills . Performs ADL's independently . Calls provider office for new concerns or questions . Unable to perform IADLs independently  Please see past updates related to this goal by clicking on the "Past Updates" button in the selected goal      . "I need some grab bars in my bathroom" (pt-stated)   Not on track    Current Barriers:  . Home Safety Concerns . Impaired Physical Mobility secondary to Tardive Dyskinesia . Self Care Deficit  Nurse Case Manager Clinical Goal(s):  [name] Over the next 30 days, patient will work with the CCM team to address needs related to home safety concerns  Goal not met, re-established 10/08/2019  CCM SW Interventions Completed 10/08/2019 . Outbound call to the patient to assess progression of patient goal and assist with care coordination . Determined the patient has not received HSE  . Performed chart review to note previous home health orders not fulfilled due to staffing challenges . Collaboration with patients primary physician to request orders for HSE . Communication with RN Case manager to provide an update of goal progression and plan  CCM RN CM Interventions:  08/23/19 call completed with patient  . Evaluation of current treatment plan related to Impaired physical mobility and patient's adherence to plan as established by provider. . Advised patient to use her cane or walker at all time to help avoid falls; assessed for recent falls and or fall history, patient denies recent falls and is currently using her cane; assessed for home safety concerns, patient is having  difficulty stepping into her bathtub and is need of grab bars; discussed patient lives in a Senior apartment and her landlord may accommodate with bathroom modifications . Provided education to patient re:  HSE; discussed patient may benefit from additional in home PT/OT services and patient is aggreeable . Collaborated with PCP provider [name], FNP regarding referral for PT/OT evaluation including a HSE . Discussed plans with patient for ongoing care management follow up and provided patient with direct contact information for care management team  Patient Self Care Activities:  . Attends all scheduled provider appointments . Calls pharmacy for medication refills . Performs ADL's independently . Calls provider office for new concerns or questions . Unable to perform IADLs independently  Please see past updates related to this goal by clicking on the "Past Updates" button in the selected goal      . "I need someone to help me take a shower" (pt-stated)       Current Barriers:  . Financial constraints related to cost of private caregiver . ADL IADL limitations . Limited access to caregiver  Social Work Clinical Goal(s):  [name] Over the next 30 days, patient will follow up with [facility] as directed by SW  CCM SW Interventions: Completed 10/08/2019 . Discussed past interventions with patient regarding caregiver o Patient is not interested in [facility] o Patient has been provided Medicaid application but has yet to complete o Patient reports desire to remain in the home with assistance with bathing . Reviewed opportunity to apply for Medicaid in hopes of qualifying o Advised the patient her reported income is over the eligibility requirement but she may qualify based on assets and medical debt o Discussed barriers in completing paper application due to limited social support o Provided the patient with the contact number to [place] to complete application via phone . Scheduled follow up call to the patient to assess outcome of call  Patient Self Care Activities:  . Patient verbalizes understanding of plan to contact DSS to complete Medicaid application  via phone . Self administers medications as prescribed . Calls provider office for new concerns or questions  Initial goal documentation         Follow Up Plan: SW will follow up with the patient over the next 30 days.   Daneen Schick, BSW, CDP Social Worker, Certified Dementia Practitioner [place] [facility] [phone]

## 2019-10-09 NOTE — Chronic Care Management (AMB) (Signed)
Chronic Care Management   Social Work Follow Up Note  10/08/2019 Name: Judy [name] MRN: [id number] DOB: [birth year]  Judy [name] is a 73 y.o. year old female who is a primary care patient of Judy [name], [place]. The CCM team was consulted for assistance with care coordination.   Review of patient status, including review of consultants reports, other relevant assessments, and collaboration with appropriate care team members and the patient's provider was performed as part of comprehensive patient evaluation and provision of chronic care management services.    SW placed a successful outbound call to the patient to assist with care coordination.  Outpatient Encounter Medications as of 10/08/2019  Medication Sig  . amLODipine (NORVASC) 5 MG tablet Take 1 tablet (5 mg total) by mouth daily.  [name] buPROPion (WELLBUTRIN XL) 150 MG 24 hr tablet TAKE 1 TABLET BY MOUTH EVERY DAY IN THE MORNING  . cholecalciferol (VITAMIN D) 25 MCG (1000 UT) tablet Take 1 tablet (1,000 Units total) by mouth daily.  [name] gabapentin (NEURONTIN) 300 MG capsule Take 1 capsule (300 mg total) by mouth 3 (three) times daily.  [name] LATUDA 120 MG TABS TAKE 1 TABLET BY MOUTH EVERY DAY WITH EVENING MEAL  . meclizine (ANTIVERT) 25 MG tablet Take 1 tablet (25 mg total) by mouth 3 (three) times daily as needed for dizziness.  . Multiple Vitamin (DAILY VITE PO) Take 1 tablet by mouth daily.  Minus [place] Tosylate (INGREZZA) 40 MG CAPS Take 40 mg by mouth daily.  . Vitamin D, Ergocalciferol, (DRISDOL) 1.25 MG (50000 UT) CAPS capsule Take 1 capsule (50,000 Units total) by mouth every 7 (seven) days.   No facility-administered encounter medications on file as of 10/08/2019.     Goals Addressed            This Visit's Progress     Patient Stated   . "I am not taking anything for my cholesterol" (pt-stated)       Current Barriers:  [name] Knowledge Deficits related to diagnosis and treatment of Hyperlipidemia  Nurse Case Manager  Clinical Goal(s):  [name] Over the next 30 days, patient will work with PCP and embedded Pharm D to address needs related to pharmacological management of Hyperlipidemia . Over the next 90 days, patient will verbalize basic understanding of Hyperlipidemia disease process and self health management plan as evidenced by patient will lower her total Cholesterol and LDL target range   CCM SW Interventions: Completed 10/08/2019 . Outbound call to the patient to assist with care coordination . Concluded the patient did receive mailed educational materials but is having difficulty understanding materials . Advised the patient SW would collaborate with RN Case Manager to request follow up . Collaboration with RN Case Manager requesting patient outreach  CCM RN CM Interventions:  08/23/19 call completed with patient  . Evaluation of current treatment plan related to Hyperlipidemia and patient's adherence to plan as established by provider. . Provided education to patient re: most recent total Cholesterol of 222 and LDL 123 obtained on 02/26/19; discussed target ranges; patient was unaware; education provided related to this disease process and treatment recommendations  . Reviewed medications with patient and discussed patient has not been prescribed pharmacological treatment for this conditon  . Collaborated with provider  regarding recommendations for start of treatment for Hyperlipidemia . Discussed plans with patient for ongoing care management follow up and provided patient with direct contact information for care management team . Provided patient with printed educational materials related to Hyperlipdemia  Patient Self Care Activities:  . Self administers medications as prescribed . Attends all scheduled provider appointments . Calls pharmacy for medication refills . Performs ADL's independently . Calls provider office for new concerns or questions . Unable to perform IADLs independently   Please see past updates related to this goal by clicking on the "Past Updates" button in the selected goal      . "I need some grab bars in my bathroom" (pt-stated)   Not on track    Current Barriers:  . Home Safety Concerns . Impaired Physical Mobility secondary to Tardive Dyskinesia . Self Care Deficit  Nurse Case Manager Clinical Goal(s):  . Over the next 30 days, patient will work with the CCM team to address needs related to home safety concerns  Goal not met, re-established 10/08/2019  CCM SW Interventions Completed 10/08/2019 . Outbound call to the patient to assess progression of patient goal and assist with care coordination . Determined the patient has not received HSE  . Performed chart review to note previous home health orders not fulfilled due to staffing challenges . Collaboration with patients primary physician to request orders for HSE . Communication with RN Case manager to provide an update of goal progression and plan  CCM RN CM Interventions:  08/23/19 call completed with patient  . Evaluation of current treatment plan related to Impaired physical mobility and patient's adherence to plan as established by provider. . Advised patient to use her cane or walker at all time to help avoid falls; assessed for recent falls and or fall history, patient denies recent falls and is currently using her cane; assessed for home safety concerns, patient is having difficulty stepping into her bathtub and is need of grab bars; discussed patient lives in a Senior apartment and her landlord may accommodate with bathroom modifications . Provided education to patient re: HSE; discussed patient may benefit from additional in home PT/OT services and patient is aggreeable . Collaborated with PCP provider [name], FNP regarding referral for PT/OT evaluation including a HSE . Discussed plans with patient for ongoing care management follow up and provided patient with direct contact  information for care management team  Patient Self Care Activities:  . Attends all scheduled provider appointments . Calls pharmacy for medication refills . Performs ADL's independently . Calls provider office for new concerns or questions . Unable to perform IADLs independently  Please see past updates related to this goal by clicking on the "Past Updates" button in the selected goal      . "I need someone to help me take a shower" (pt-stated)       Current Barriers:  . Financial constraints related to cost of private caregiver . ADL IADL limitations . Limited access to caregiver  Social Work Clinical Goal(s):  . Over the next 30 days, patient will follow up with [place] [facility] as directed by SW  CCM SW Interventions: Completed 10/08/2019 . Discussed past interventions with patient regarding caregiver o Patient is not interested in [facility] o Patient has been provided Medicaid application but has yet to complete o Patient reports desire to remain in the home with assistance with bathing . Reviewed opportunity to apply for Medicaid in hopes of qualifying o Advised the patient her reported income is over the eligibility requirement but she may qualify based on assets and medical debt o Discussed barriers in completing paper application due to limited social support o Provided the patient with the contact number to   [place] [facility] to complete application via phone . Scheduled follow up call to the patient to assess outcome of call  Patient Self Care Activities:  . Patient verbalizes understanding of plan to contact DSS to complete Medicaid application via phone . Self administers medications as prescribed . Calls provider office for new concerns or questions  Initial goal documentation         Follow Up Plan: SW will follow up with the patient over the next 30 days.    , BSW, CDP Social Worker, Certified Dementia Practitioner [facility]  Care Management [phone]  Total time spent performing care coordination and/or care management activities with the patient by phone or face to face = 12 minutes.      

## 2019-10-14 ENCOUNTER — Other Ambulatory Visit: Payer: Self-pay | Admitting: Nurse Practitioner

## 2019-10-14 DIAGNOSIS — I10 Essential (primary) hypertension: Secondary | ICD-10-CM

## 2019-10-22 ENCOUNTER — Other Ambulatory Visit: Payer: Self-pay

## 2019-10-22 ENCOUNTER — Encounter: Payer: Medicare HMO | Admitting: Nurse Practitioner

## 2019-10-22 ENCOUNTER — Telehealth (INDEPENDENT_AMBULATORY_CARE_PROVIDER_SITE_OTHER): Payer: Medicare Other | Admitting: Nurse Practitioner

## 2019-10-22 ENCOUNTER — Encounter: Payer: Self-pay | Admitting: Nurse Practitioner

## 2019-10-22 DIAGNOSIS — I10 Essential (primary) hypertension: Secondary | ICD-10-CM

## 2019-10-22 DIAGNOSIS — E782 Mixed hyperlipidemia: Secondary | ICD-10-CM

## 2019-10-22 DIAGNOSIS — J00 Acute nasopharyngitis [common cold]: Secondary | ICD-10-CM

## 2019-10-22 NOTE — Progress Notes (Signed)
Virtual Visit via Telephone   This visit type was conducted due to national recommendations for restrictions regarding the COVID-19 Pandemic (e.g. social distancing) in an effort to limit this patient's exposure and mitigate transmission in our community.  Due to her co-morbid illnesses, this patient is at least at moderate risk for complications without adequate follow up.  This format is felt to be most appropriate for this patient at this time.  All issues noted in this document were discussed and addressed.  A limited physical exam was performed with this format.    This visit type was conducted due to national recommendations for restrictions regarding the COVID-19 Pandemic (e.g. social distancing) in an effort to limit this patient's exposure and mitigate transmission in our community.  Patients identity confirmed using two different identifiers.  This format is felt to be most appropriate for this patient at this time.  All issues noted in this document were discussed and addressed.  No physical exam was performed (except for noted visual exam findings with Video Visits).    Date:  10/28/2019   ID:  Judy [name], DOB 1946/08/13, MRN [id number]  Patient Location:  Home - spoke with Judy [name]  Provider location:   Office    Chief Complaint:  Judy [name] nose  History of Present Illness:    Judy [name] is a 74 y.o. female who presents via video conferencing for a telehealth visit today.    The patient does have symptoms concerning for COVID-19 infection (fever, chills, cough, or new shortness of breath).   She feels like she is better.  She took tylenol.  She is eating well.  She still has a sense of taste and smell.    URI  This is a new problem. The current episode started 1 to 4 weeks ago (2 weeks ago). There has been no fever. Associated symptoms include rhinorrhea. Pertinent negatives include no abdominal pain, chest pain, congestion, coughing, dysuria, headaches,  sneezing or wheezing. She has tried nothing for the symptoms. The treatment provided no relief.  Hypertension This is a chronic problem. The current episode started more than 1 year ago. The problem is unchanged. The problem is controlled. Pertinent negatives include no anxiety, chest pain, headaches or palpitations. Risk factors for coronary artery disease include sedentary lifestyle. Past treatments include calcium channel blockers. There are no compliance problems.      Past Medical History:  Diagnosis Date  . Allergic rhinitis   . Anemia   . Anxiety   . Arthritis   . Bipolar 1 disorder ([place])   . Depression   . Diverticulosis of colon (without mention of hemorrhage) 08/25/2011   [name]  . 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 Hyperlipidemia   . Hypertension   . Obesity   . Renal disorder   . Vertigo    Past Surgical History:  Procedure Laterality Date  . COLONOSCOPY  08/25/2011   Procedure: COLONOSCOPY;  Surgeon: Landry Dyke, MD;  Location: [facility];  Service: Endoscopy;  Laterality: N/A;  . DILATION AND CURETTAGE OF UTERUS  1960's     Current Meds  Medication Sig  . amLODipine (NORVASC) 5 MG tablet TAKE 1 TABLET BY MOUTH EVERY DAY  . buPROPion (WELLBUTRIN XL) 150 MG 24 hr tablet TAKE 1 TABLET BY MOUTH EVERY DAY IN THE MORNING  . gabapentin (NEURONTIN) 300 MG capsule Take 1 capsule (300 mg total) by mouth 3 (three) times daily.  . meclizine (  ANTIVERT) 25 MG tablet Take 1 tablet (25 mg total) by mouth 3 (three) times daily as needed for dizziness.  . Multiple Vitamin (DAILY VITE PO) Take 1 tablet by mouth daily.  Minus Liberty Tosylate (INGREZZA) 40 MG CAPS Take 40 mg by mouth daily.  . [DISCONTINUED] LATUDA 120 MG TABS TAKE 1 TABLET BY MOUTH EVERY DAY WITH EVENING MEAL  . [DISCONTINUED] Vitamin D, Ergocalciferol, (DRISDOL) 1.25 MG (50000 UT) CAPS capsule Take 1 capsule (50,000 Units total) by mouth every 7 (seven) days.     Allergies:   Patient has no  known allergies.   Social History   Tobacco Use  . Smoking status: Never Smoker  . Smokeless tobacco: Never Used  Substance Use Topics  . Alcohol use: No  . Drug use: No     Family Hx: The patient's family history includes Colon cancer in her brother; Hyperlipidemia in her sister; Hypertension in her brother and mother; Hypothyroidism in her sister; Kidney disease in her brother and mother; Prostate cancer in her brother and brother; Stomach cancer in her father; Stroke in her brother.  ROS:   Please see the history of present illness.    Review of Systems  Constitutional: Negative.   HENT: Positive for rhinorrhea. Negative for congestion and sneezing.   Respiratory: Negative for cough and wheezing.   Cardiovascular: Negative for chest pain and palpitations.  Gastrointestinal: Negative for abdominal pain.  Genitourinary: Negative for dysuria.  Neurological: Negative for dizziness and headaches.  Psychiatric/Behavioral: Negative.     All other systems reviewed and are negative.   Labs/Other Tests and Data Reviewed:    Recent Labs: 06/14/2019: ALT 27; Hemoglobin 13.9; Platelets 273 07/02/2019: BUN 11; Creatinine, Ser 1.03; Potassium 3.6; Sodium 145   Recent Lipid Panel Lab Results  Component Value Date/Time   CHOL 222 (H) 02/26/2019 12:57 PM   TRIG 111 02/26/2019 12:57 PM   HDL 77 02/26/2019 12:57 PM   CHOLHDL 2.9 02/26/2019 12:57 PM   CHOLHDL 3.4 03/25/2017 03:50 PM   LDLCALC 123 (H) 02/26/2019 12:57 PM    Wt Readings from Last 3 Encounters:  09/18/19 169 lb (76.7 kg)  07/02/19 170 lb 12.8 oz (77.5 kg)  06/14/19 165 lb (74.8 kg)     Exam:    Vital Signs:  There were no vitals taken for this visit.    Physical Exam  Constitutional: She is oriented to person, place, and time and well-developed, well-nourished, and in no distress. No distress.  Pulmonary/Chest: Effort normal. No respiratory distress.  Neurological: She is alert and oriented to person, place, and  time.  Psychiatric: Mood, memory, affect and judgment normal.    ASSESSMENT & PLAN:    1. Essential hypertension  Chronic, she was unable to get her blood pressure today she is at home  Will check kidney functions in one week once she no longer has cold symptoms - CMP14+EGFR; Future  2. Mixed hyperlipidemia  Chronic, controlled  No current medications, will check lipid panel  Pending lab results will start on anticholesterol medication - Lipid Profile; Future  3. Acute nasopharyngitis  2 week history of cold symptoms she is feeling better  She has limited exposure risk and at this time will not check for covid aware if worse return call to office   COVID-19 Education: The signs and symptoms of COVID-19 were discussed with the patient and how to seek care for testing (follow up with PCP or arrange E-visit).  The importance of social distancing was discussed  today.  Patient Risk:   After full review of this patients clinical status, I feel that they are at least moderate risk at this time.  Time:   Today, I have spent 9 minutes/ seconds with the patient with telehealth technology discussing above diagnoses.     Medication Adjustments/Labs and Tests Ordered: Current medicines are reviewed at length with the patient today.  Concerns regarding medicines are outlined above.   Tests Ordered: Orders Placed This Encounter  Procedures  . Lipid Profile  . CMP14+EGFR    Medication Changes: No orders of the defined types were placed in this encounter.   Disposition:  Follow up prn  Signed, [name], FNP

## 2019-10-24 ENCOUNTER — Ambulatory Visit (HOSPITAL_COMMUNITY)
Admission: EM | Admit: 2019-10-24 | Discharge: 2019-10-24 | Disposition: A | Discharge status: Home / Self Care | Payer: Medicare Other | Attending: Family Medicine | Admitting: Family Medicine

## 2019-10-24 ENCOUNTER — Encounter (HOSPITAL_COMMUNITY): Payer: Self-pay | Admitting: Emergency Medicine

## 2019-10-24 ENCOUNTER — Other Ambulatory Visit: Payer: Self-pay

## 2019-10-24 DIAGNOSIS — M79605 Pain in left leg: Secondary | ICD-10-CM

## 2019-10-24 DIAGNOSIS — M79604 Pain in right leg: Secondary | ICD-10-CM

## 2019-10-24 DIAGNOSIS — R202 Paresthesia of skin: Secondary | ICD-10-CM

## 2019-10-24 MED ORDER — KETOROLAC TROMETHAMINE 30 MG/ML IJ SOLN
INTRAMUSCULAR | Status: AC
  Filled 2019-10-24: qty 1

## 2019-10-24 MED ORDER — KETOROLAC TROMETHAMINE 30 MG/ML IJ SOLN
15.0000 mg | Freq: Once | INTRAMUSCULAR | Status: AC
  Administered 2019-10-24: 15 mg via INTRAMUSCULAR

## 2019-10-24 NOTE — Discharge Instructions (Signed)
We gave you a 1 time dose of Toradol 15 mg for pain Continue tylenol for pain Follow up with primary care and take the gabapentin prescribed to you

## 2019-10-24 NOTE — ED Provider Notes (Signed)
[place]    CSN: [id number] Arrival date & time: 10/24/19  1243      History   Chief Complaint Chief Complaint  Patient presents with  . Leg Pain    HPI Judy [name] is a 74 y.o. female history of bipolar type I, CKD, GERD, hypertension, presenting today for evaluation of bilateral leg pain.  Patient states that over the past week she has had increased pain in both of her lower legs.  She denies any injury or fall.  Denies symptoms worse on one side.  Denies any swelling.  She reports numbness in her legs and feet.  Reports she had some numbness prior to 1 week ago, but worse today.  She is ambulating with a cane which is normal for her.  She has been using Tylenol without relief.  When questioned about daily medicines, she does not recall being on gabapentin, but it appears that this has been prescribed to her of recently.  Denies history of diabetes.  HPI  Past Medical History:  Diagnosis Date  . Allergic rhinitis   . Anemia   . Anxiety   . Arthritis   . Bipolar 1 disorder ([place])   . Depression   . Diverticulosis of colon (without mention of hemorrhage) 08/25/2011   [name]  . 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 Hyperlipidemia   . Hypertension   . Obesity   . Renal disorder   . Vertigo     Patient Active Problem List   Diagnosis Date Noted  . Generalized anxiety disorder 07/24/2018  . Solitary pulmonary nodule on lung CT 06/13/2017  . Spinal stenosis at L4-L5 level   . Dysphagia 05/25/2017  . Tardive dyskinesia 05/25/2017  . Dyspnea on exertion 05/25/2017  . Fall 05/25/2017  . Bilateral leg pain   . Chronic pain of right knee 03/25/2017  . High risk medication use 03/25/2017  . Family hx of colon cancer 03/25/2017  . Occupational exposure in workplace 03/25/2017  . Depressed bipolar I disorder in partial remission ([place]) 03/25/2017  . Routine general medical examination at a health care facility 06/04/2014  . Hyperlipidemia with  target LDL less than 130 03/20/2014  . Essential hypertension, benign 03/20/2014  . Unspecified constipation 03/20/2014  . Light headedness 03/20/2014  . Acute on chronic kidney disease, stage 3 03/20/2014  . Hypokalemia 03/20/2014  . Encounter for therapeutic drug monitoring 03/20/2014  . Other dysphagia 12/26/2013  . GERD (gastroesophageal reflux disease) 12/21/2013  . Constipation 12/21/2013  . Syncope 12/10/2013  . Leukocytosis 12/10/2013  . URI (upper respiratory infection) 12/10/2013  . Bipolar 1 disorder ([place])   . Obesity (BMI 30-39.9)   . Hearing loss in left ear   . Vertigo   . HYPERCHOLESTEROLEMIA 10/08/2010  . DEPRESSION 10/08/2010  . HYPERTENSION 10/08/2010  . ALLERGIC RHINITIS 10/08/2010    Past Surgical History:  Procedure Laterality Date  . COLONOSCOPY  08/25/2011   Procedure: COLONOSCOPY;  Surgeon: Landry Dyke, MD;  Location: [facility];  Service: Endoscopy;  Laterality: N/A;  . DILATION AND CURETTAGE OF UTERUS  1960's    OB History   No obstetric history on file.      Home Medications    Prior to Admission medications   Medication Sig Start Date End Date Taking? Authorizing Provider  amLODipine (NORVASC) 5 MG tablet TAKE 1 TABLET BY MOUTH EVERY DAY 10/15/19   [name], FNP  buPROPion (WELLBUTRIN XL) 150 MG 24 hr  tablet TAKE 1 TABLET BY MOUTH EVERY DAY IN THE MORNING 09/16/19   Donnal Moat T, PA-C  gabapentin (NEURONTIN) 300 MG capsule Take 1 capsule (300 mg total) by mouth 3 (three) times daily. 08/09/19   Hurst, Helene Kelp T, PA-C  LATUDA 120 MG TABS TAKE 1 TABLET BY MOUTH EVERY DAY WITH EVENING MEAL 07/25/19   Hurst, Helene Kelp T, PA-C  meclizine (ANTIVERT) 25 MG tablet Take 1 tablet (25 mg total) by mouth 3 (three) times daily as needed for dizziness. 07/08/18   Jola Schmidt, MD  Multiple Vitamin (DAILY VITE PO) Take 1 tablet by mouth daily.    [provider]  Valbenazine Tosylate (INGREZZA) 40 MG CAPS Take 40 mg by mouth daily. 09/05/19    Donnal Moat T, PA-C  Vitamin D, Ergocalciferol, (DRISDOL) 1.25 MG (50000 UT) CAPS capsule Take 1 capsule (50,000 Units total) by mouth every 7 (seven) days. 08/09/19   Addison Lank, PA-C    Family History Family History  Problem Relation Age of Onset  . Prostate cancer Brother   . Hypertension Brother   . Kidney disease Brother   . Colon cancer Brother   . Hypertension Mother   . Kidney disease Mother   . Stomach cancer Father   . Hyperlipidemia Sister   . Hypothyroidism Sister   . Stroke Brother   . Prostate cancer Brother     Social History Social History   Tobacco Use  . Smoking status: Never Smoker  . Smokeless tobacco: Never Used  Substance Use Topics  . Alcohol use: No  . Drug use: No     Allergies   Patient has no known allergies.   Review of Systems Review of Systems  Constitutional: Negative for fatigue and fever.  Eyes: Negative for visual disturbance.  Respiratory: Negative for shortness of breath.   Cardiovascular: Negative for chest pain.  Gastrointestinal: Negative for abdominal pain, nausea and vomiting.  Musculoskeletal: Positive for arthralgias, gait problem and myalgias. Negative for joint swelling.  Skin: Negative for color change, rash and wound.  Neurological: Positive for numbness. Negative for dizziness, weakness, light-headedness and headaches.     Physical Exam Triage Vital Signs ED Triage Vitals  Enc Vitals Group     BP 10/24/19 1330 (!) 147/68     Pulse Rate 10/24/19 1330 93     Resp 10/24/19 1330 20     Temp 10/24/19 1330 98.8 F (37.1 C)     Temp Source 10/24/19 1330 Oral     SpO2 10/24/19 1330 100 %     Weight --      Height --      Head Circumference --      Peak Flow --      Pain Score 10/24/19 1326 9     Pain Loc --      Pain Edu? --      Excl. in [place]? --    No data found.  Updated Vital Signs BP (!) 147/68 (BP Location: Left Arm)   Pulse 93   Temp 98.8 F (37.1 C) (Oral)   Resp 20   SpO2 100%    Visual Acuity Right Eye Distance:   Left Eye Distance:   Bilateral Distance:    Right Eye Near:   Left Eye Near:    Bilateral Near:     Physical Exam Vitals and nursing note reviewed.  Constitutional:      Appearance: She is well-developed.     Comments: No acute distress  HENT:  Head: Normocephalic and atraumatic.     Nose: Nose normal.  Eyes:     Conjunctiva/sclera: Conjunctivae normal.  Cardiovascular:     Rate and Rhythm: Normal rate.  Pulmonary:     Effort: Pulmonary effort is normal. No respiratory distress.  Abdominal:     General: There is no distension.  Musculoskeletal:        General: Normal range of motion.     Cervical back: Neck supple.     Comments: Bilateral lower extremities symmetric without edema swelling or erythema, no calf tenderness bilaterally, decreased sensation distally to toes, mild tenderness throughout dorsum of foot and anterior lower leg, full active range of motion at ankle and knee Dorsalis pedis 2+ bilaterally, cap refill brisk Strength diffusely decreased bilaterally 4/5 at hips and knees bilaterally; difficulty obtaining patellar reflexes bilaterally  Patient ambulates from chair to exam table with cane, slowly but relatively minimal assistance by myself  Skin:    General: Skin is warm and dry.  Neurological:     Mental Status: She is alert and oriented to person, place, and time.      UC Treatments / Results  Labs (all labs ordered are listed, but only abnormal results are displayed) Labs Reviewed - No data to display  EKG   Radiology No results found.  Procedures Procedures (including critical care time)  Medications Ordered in UC Medications  ketorolac (TORADOL) 30 MG/ML injection 15 mg (15 mg Intramuscular Given 10/24/19 1438)    Initial Impression / Assessment and Plan / UC Course  I have reviewed the triage vital signs and the nursing notes.  Pertinent labs & imaging results that were available during my  care of the patient were reviewed by me and considered in my medical decision making (see chart for details).     Lower leg pain bilateral, no swelling or erythema, do not suspect DVT or cellulitis at this time.  Exam not suggestive of peripheral vascular disease, no edema.  Possible neuropathy given associated numbness.  Seems less likely Guillain-Barr and more likely neuropathy given symptoms greater than 1 week.  Does have history of CKD, last creatinine on file was 1.03 and GFR 63, will proceed with one-time dose of Toradol 15 mg but will continue with Tylenol.  Deferring any further medicines as she is already on gabapentin.  Do not feel steroids would be helpful at this time and given psych history deferring this.  Recommending to follow-up with primary care for further management, possible increasing gabapentin.   Discussed strict return precautions. Patient verbalized understanding and is agreeable with plan.  Final Clinical Impressions(s) / UC Diagnoses   Final diagnoses:  Bilateral leg pain  Paresthesia of both lower extremities     Discharge Instructions     We gave you a 1 time dose of Toradol 15 mg for pain Continue tylenol for pain Follow up with primary care and take the gabapentin prescribed to you   ED Prescriptions    None     PDMP not reviewed this encounter.   Janith Lima, [place] 10/24/19 1539

## 2019-10-24 NOTE — ED Triage Notes (Signed)
Bilateral leg pain that started hurting one week ago.  Reports pcp sent her to [facility] today.

## 2019-10-24 NOTE — ED Notes (Signed)
Assisted patient into gown and provided gown

## 2019-10-25 ENCOUNTER — Other Ambulatory Visit: Payer: Self-pay | Admitting: Physician Assistant

## 2019-10-25 ENCOUNTER — Ambulatory Visit: Payer: Self-pay

## 2019-10-25 DIAGNOSIS — I10 Essential (primary) hypertension: Secondary | ICD-10-CM

## 2019-10-25 DIAGNOSIS — Z741 Need for assistance with personal care: Secondary | ICD-10-CM

## 2019-10-26 NOTE — Patient Instructions (Signed)
Social Worker Visit Information  Goals we discussed today:  Goals Addressed            This Visit's Progress     Patient Stated   . "I need some grab bars in my bathroom" (pt-stated)   Not on track    Current Barriers:  . Home Safety Concerns . Impaired Physical Mobility secondary to Tardive Dyskinesia . Self Care Deficit  Nurse Case Manager Clinical Goal(s):  [name] Over the next 30 days, patient will work with the CCM team to address needs related to home safety concerns  Goal not met, re-established 10/08/2019  CCM SW Interventions Completed 10/25/2019 . Communication received from the patients primary care provider reporting difficulty identifying a skilled home health need . Collaboration with RN Case Manager who will follow up with the patient to conduct a clinical assessment  CCM RN CM Interventions:  08/23/19 call completed with patient  . Evaluation of current treatment plan related to Impaired physical mobility and patient's adherence to plan as established by provider. . Advised patient to use her cane or walker at all time to help avoid falls; assessed for recent falls and or fall history, patient denies recent falls and is currently using her cane; assessed for home safety concerns, patient is having difficulty stepping into her bathtub and is need of grab bars; discussed patient lives in a Senior apartment and her landlord may accommodate with bathroom modifications . Provided education to patient re: HSE; discussed patient may benefit from additional in home PT/OT services and patient is aggreeable . Collaborated with PCP provider [name], FNP regarding referral for PT/OT evaluation including a HSE . Discussed plans with patient for ongoing care management follow up and provided patient with direct contact information for care management team  Patient Self Care Activities:  . Attends all scheduled provider appointments . Calls pharmacy for medication  refills . Performs ADL's independently . Calls provider office for new concerns or questions . Unable to perform IADLs independently  Please see past updates related to this goal by clicking on the "Past Updates" button in the selected goal      . "I need someone to help me take a shower" (pt-stated)   Not on track    Current Barriers:  . Financial constraints related to cost of private caregiver . ADL IADL limitations . Limited access to caregiver  Social Work Clinical Goal(s):  [name] Over the next 30 days, patient will follow up with [facility] as directed by SW  CCM SW Interventions: Completed 10/25/2019 . Outbound call placed to the patient to assess progression of patient goal . Patient reports she has yet to follow up with DSS to complete a Medicaid application . Assessed for current barriers contributing to patients inability to progress through goal o Patient stated "I don't know" . Collaboration with RN Case Manager to discuss patients inability to progress  o Discussed opportunity of requesting home health orders, if appropriate, for SW to assist with application in the patients home   Patient Self Care Activities:  . Patient verbalizes understanding of plan to contact DSS to complete Medicaid application via phone . Self administers medications as prescribed . Calls provider office for new concerns or questions  Please see past updates related to this goal by clicking on the "Past Updates" button in the selected goal          Follow Up Plan: A member of the care management team will outreach the patient over  the next 14 days.   Daneen Schick, BSW, CDP Social Worker, Certified Dementia Practitioner [place] [facility] [place] [facility] [phone]

## 2019-10-26 NOTE — Chronic Care Management (AMB) (Signed)
Chronic Care Management    Social Work Follow Up Note  10/25/2019 Name: Judy [name] MRN: [id number] DOB: Jun 14, 1946  Judy [name] is a 74 y.o. year old female who is a primary care patient of [name], [place]. The CCM team was consulted for assistance with care coordination.   Review of patient status, including review of consultants reports, other relevant assessments, and collaboration with appropriate care team members and the patient's provider was performed as part of comprehensive patient evaluation and provision of chronic care management services.    SW placed an outbound call to the patient to assist with care coordination.  Outpatient Encounter Medications as of 10/25/2019  Medication Sig  . amLODipine (NORVASC) 5 MG tablet TAKE 1 TABLET BY MOUTH EVERY DAY  . buPROPion (WELLBUTRIN XL) 150 MG 24 hr tablet TAKE 1 TABLET BY MOUTH EVERY DAY IN THE MORNING  . gabapentin (NEURONTIN) 300 MG capsule Take 1 capsule (300 mg total) by mouth 3 (three) times daily.  [name] LATUDA 120 MG TABS TAKE 1 TABLET BY MOUTH EVERY DAY WITH EVENING MEAL  . meclizine (ANTIVERT) 25 MG tablet Take 1 tablet (25 mg total) by mouth 3 (three) times daily as needed for dizziness.  . Multiple Vitamin (DAILY VITE PO) Take 1 tablet by mouth daily.  [place] Tosylate (INGREZZA) 40 MG CAPS Take 40 mg by mouth daily.  . Vitamin D, Ergocalciferol, (DRISDOL) 1.25 MG (50000 UT) CAPS capsule Take 1 capsule (50,000 Units total) by mouth every 7 (seven) days.   No facility-administered encounter medications on file as of 10/25/2019.     Goals Addressed            This Visit's Progress     Patient Stated   . "I need some grab bars in my bathroom" (pt-stated)   Not on track    Current Barriers:  . Home Safety Concerns . Impaired Physical Mobility secondary to Tardive Dyskinesia . Self Care Deficit  Nurse Case Manager Clinical Goal(s):  [name] Over the next 30 days, patient will work with the CCM team to  address needs related to home safety concerns  Goal not met, re-established 10/08/2019  CCM SW Interventions Completed 10/25/2019 . Communication received from the patients primary care provider reporting difficulty identifying a skilled home health need . Collaboration with RN Case Manager who will follow up with the patient to conduct a clinical assessment  CCM RN CM Interventions:  08/23/19 call completed with patient  . Evaluation of current treatment plan related to Impaired physical mobility and patient's adherence to plan as established by provider. . Advised patient to use her cane or walker at all time to help avoid falls; assessed for recent falls and or fall history, patient denies recent falls and is currently using her cane; assessed for home safety concerns, patient is having difficulty stepping into her bathtub and is need of grab bars; discussed patient lives in a Senior apartment and her landlord may accommodate with bathroom modifications . Provided education to patient re: HSE; discussed patient may benefit from additional in home PT/OT services and patient is aggreeable . Collaborated with PCP provider [name], FNP regarding referral for PT/OT evaluation including a HSE . Discussed plans with patient for ongoing care management follow up and provided patient with direct contact information for care management team  Patient Self Care Activities:  . Attends all scheduled provider appointments . Calls pharmacy for medication refills . Performs ADL's independently . Calls provider office for new  concerns or questions . Unable to perform IADLs independently  Please see past updates related to this goal by clicking on the "Past Updates" button in the selected goal      . "I need someone to help me take a shower" (pt-stated)   Not on track    Current Barriers:  . Financial constraints related to cost of private caregiver . ADL IADL limitations . Limited access to  caregiver  Social Work Clinical Goal(s):  [name] Over the next 30 days, patient will follow up with [facility] as directed by SW  CCM SW Interventions: Completed 10/25/2019 . Outbound call placed to the patient to assess progression of patient goal . Patient reports she has yet to follow up with DSS to complete a Medicaid application . Assessed for current barriers contributing to patients inability to progress through goal o Patient stated "I don't know" . Collaboration with RN Case Manager to discuss patients inability to progress  o Discussed opportunity of requesting home health orders, if appropriate, for SW to assist with application in the patients home   Patient Self Care Activities:  . Patient verbalizes understanding of plan to contact DSS to complete Medicaid application via phone . Self administers medications as prescribed . Calls provider office for new concerns or questions  Please see past updates related to this goal by clicking on the "Past Updates" button in the selected goal          Follow Up Plan: A member of the care management team will outreach the patient over the next 14 days.   Daneen Schick, BSW, CDP Social Worker, Certified Dementia Practitioner [place] [facility] [place] [facility] [phone]  Total time spent performing care coordination and/or care management activities with the patient by phone or face to face = 7 minutes.

## 2019-10-27 ENCOUNTER — Other Ambulatory Visit: Payer: Self-pay | Admitting: Physician Assistant

## 2019-10-29 ENCOUNTER — Telehealth: Payer: Self-pay

## 2019-10-30 ENCOUNTER — Other Ambulatory Visit: Payer: Self-pay

## 2019-10-30 ENCOUNTER — Other Ambulatory Visit: Payer: Medicare Other

## 2019-10-30 DIAGNOSIS — E782 Mixed hyperlipidemia: Secondary | ICD-10-CM

## 2019-10-30 DIAGNOSIS — I10 Essential (primary) hypertension: Secondary | ICD-10-CM

## 2019-10-31 LAB — LIPID PANEL
Chol/HDL Ratio: 2.8 ratio (ref 0.0–4.4)
Cholesterol, Total: 240 mg/dL — ABNORMAL HIGH (ref 100–199)
HDL: 85 mg/dL (ref >39)
LDL Chol Calc (NIH): 143 mg/dL — ABNORMAL HIGH (ref 0–99)
Triglycerides: 74 mg/dL (ref 0–149)
VLDL Cholesterol Cal: 12 mg/dL (ref 5–40)

## 2019-10-31 LAB — CMP14+EGFR
ALT: 16 IU/L (ref 0–32)
AST: 26 IU/L (ref 0–40)
Albumin/Globulin Ratio: 1.5 (ref 1.2–2.2)
Albumin: 4.7 g/dL (ref 3.7–4.7)
Alkaline Phosphatase: 80 IU/L (ref 39–117)
BUN/Creatinine Ratio: 10 — ABNORMAL LOW (ref 12–28)
BUN: 13 mg/dL (ref 8–27)
Bilirubin Total: 0.3 mg/dL (ref 0.0–1.2)
CO2: 25 mmol/L (ref 20–29)
Calcium: 10.3 mg/dL (ref 8.7–10.3)
Chloride: 100 mmol/L (ref 96–106)
Creatinine, Ser: 1.29 mg/dL — ABNORMAL HIGH (ref 0.57–1.00)
GFR calc Af Amer: 48 mL/min/{1.73_m2} — ABNORMAL LOW (ref >59)
GFR calc non Af Amer: 41 mL/min/{1.73_m2} — ABNORMAL LOW (ref >59)
Globulin, Total: 3.1 g/dL (ref 1.5–4.5)
Glucose: 128 mg/dL — ABNORMAL HIGH (ref 65–99)
Potassium: 3.8 mmol/L (ref 3.5–5.2)
Sodium: 143 mmol/L (ref 134–144)
Total Protein: 7.8 g/dL (ref 6.0–8.5)

## 2019-11-01 MED ORDER — ATORVASTATIN CALCIUM 10 MG PO TABS: 10.0000 mg | ORAL_TABLET | Freq: Every day | ORAL | 3 refills | Status: DC

## 2019-11-05 ENCOUNTER — Telehealth: Payer: Self-pay | Admitting: Physician Assistant

## 2019-11-05 NOTE — Telephone Encounter (Signed)
Patient may need to have a different medicine sent in since [facility] card will not work with her medicare plan.

## 2019-11-05 NOTE — Telephone Encounter (Signed)
Pt left message stating she needs help to reduce Latuda Rx @ [facility]. Cost $700.  Please return call @ [phone]

## 2019-11-06 ENCOUNTER — Emergency Department (HOSPITAL_COMMUNITY): Payer: Medicare Other

## 2019-11-06 ENCOUNTER — Encounter (HOSPITAL_COMMUNITY): Payer: Self-pay | Admitting: Family Medicine

## 2019-11-06 ENCOUNTER — Inpatient Hospital Stay (HOSPITAL_COMMUNITY)
Admission: EM | Admit: 2019-11-06 | Discharge: 2019-11-08 | DRG: 552 | Disposition: A | Discharge status: Skilled Nursing Facility | Payer: Medicare Other | Attending: Family Medicine | Admitting: Family Medicine

## 2019-11-06 DIAGNOSIS — Z6829 Body mass index (BMI) 29.0-29.9, adult: Secondary | ICD-10-CM

## 2019-11-06 DIAGNOSIS — Z20822 Contact with and (suspected) exposure to covid-19: Secondary | ICD-10-CM | POA: Diagnosis present

## 2019-11-06 DIAGNOSIS — H9192 Unspecified hearing loss, left ear: Secondary | ICD-10-CM | POA: Diagnosis present

## 2019-11-06 DIAGNOSIS — R262 Difficulty in walking, not elsewhere classified: Secondary | ICD-10-CM

## 2019-11-06 DIAGNOSIS — Z8042 Family history of malignant neoplasm of prostate: Secondary | ICD-10-CM

## 2019-11-06 DIAGNOSIS — E669 Obesity, unspecified: Secondary | ICD-10-CM | POA: Diagnosis present

## 2019-11-06 DIAGNOSIS — W19XXXA Unspecified fall, initial encounter: Secondary | ICD-10-CM | POA: Diagnosis present

## 2019-11-06 DIAGNOSIS — M4800 Spinal stenosis, site unspecified: Secondary | ICD-10-CM | POA: Diagnosis present

## 2019-11-06 DIAGNOSIS — M1990 Unspecified osteoarthritis, unspecified site: Secondary | ICD-10-CM | POA: Diagnosis present

## 2019-11-06 DIAGNOSIS — Z79899 Other long term (current) drug therapy: Secondary | ICD-10-CM

## 2019-11-06 DIAGNOSIS — R531 Weakness: Secondary | ICD-10-CM | POA: Diagnosis not present

## 2019-11-06 DIAGNOSIS — Z8349 Family history of other endocrine, nutritional and metabolic diseases: Secondary | ICD-10-CM

## 2019-11-06 DIAGNOSIS — M5186 Other intervertebral disc disorders, lumbar region: Secondary | ICD-10-CM | POA: Diagnosis present

## 2019-11-06 DIAGNOSIS — M48062 Spinal stenosis, lumbar region with neurogenic claudication: Principal | ICD-10-CM | POA: Diagnosis present

## 2019-11-06 DIAGNOSIS — Z841 Family history of disorders of kidney and ureter: Secondary | ICD-10-CM

## 2019-11-06 DIAGNOSIS — Z823 Family history of stroke: Secondary | ICD-10-CM

## 2019-11-06 DIAGNOSIS — Z8249 Family history of ischemic heart disease and other diseases of the circulatory system: Secondary | ICD-10-CM

## 2019-11-06 DIAGNOSIS — E785 Hyperlipidemia, unspecified: Secondary | ICD-10-CM | POA: Diagnosis present

## 2019-11-06 DIAGNOSIS — R29898 Other symptoms and signs involving the musculoskeletal system: Secondary | ICD-10-CM

## 2019-11-06 DIAGNOSIS — F319 Bipolar disorder, unspecified: Secondary | ICD-10-CM | POA: Diagnosis present

## 2019-11-06 DIAGNOSIS — Z800 Family history of malignant neoplasm of digestive organs: Secondary | ICD-10-CM

## 2019-11-06 DIAGNOSIS — I10 Essential (primary) hypertension: Secondary | ICD-10-CM | POA: Diagnosis present

## 2019-11-06 DIAGNOSIS — K5900 Constipation, unspecified: Secondary | ICD-10-CM | POA: Diagnosis present

## 2019-11-06 DIAGNOSIS — M4316 Spondylolisthesis, lumbar region: Secondary | ICD-10-CM | POA: Diagnosis present

## 2019-11-06 DIAGNOSIS — F411 Generalized anxiety disorder: Secondary | ICD-10-CM | POA: Diagnosis present

## 2019-11-06 DIAGNOSIS — K219 Gastro-esophageal reflux disease without esophagitis: Secondary | ICD-10-CM | POA: Diagnosis present

## 2019-11-06 DIAGNOSIS — R350 Frequency of micturition: Secondary | ICD-10-CM | POA: Diagnosis present

## 2019-11-06 DIAGNOSIS — Z23 Encounter for immunization: Secondary | ICD-10-CM

## 2019-11-06 LAB — CBC WITH DIFFERENTIAL/PLATELET
Abs Immature Granulocytes: 0.02 10*3/uL (ref 0.00–0.07)
Basophils Absolute: 0 10*3/uL (ref 0.0–0.1)
Basophils Relative: 0 %
Eosinophils Absolute: 0.1 10*3/uL (ref 0.0–0.5)
Eosinophils Relative: 1 %
HCT: 44.4 % (ref 36.0–46.0)
Hemoglobin: 14.5 g/dL (ref 12.0–15.0)
Immature Granulocytes: 0 %
Lymphocytes Relative: 39 %
Lymphs Abs: 2.7 10*3/uL (ref 0.7–4.0)
MCH: 30.5 pg (ref 26.0–34.0)
MCHC: 32.7 g/dL (ref 30.0–36.0)
MCV: 93.5 fL (ref 80.0–100.0)
Monocytes Absolute: 0.5 10*3/uL (ref 0.1–1.0)
Monocytes Relative: 7 %
Neutro Abs: 3.7 10*3/uL (ref 1.7–7.7)
Neutrophils Relative %: 53 %
Platelets: 248 10*3/uL (ref 150–400)
RBC: 4.75 MIL/uL (ref 3.87–5.11)
RDW: 14.1 % (ref 11.5–15.5)
WBC: 6.9 10*3/uL (ref 4.0–10.5)
nRBC: 0 % (ref 0.0–0.2)

## 2019-11-06 LAB — URINALYSIS, ROUTINE W REFLEX MICROSCOPIC
Bilirubin Urine: NEGATIVE
Glucose, UA: NEGATIVE mg/dL
Hgb urine dipstick: NEGATIVE
Ketones, ur: NEGATIVE mg/dL
Leukocytes,Ua: NEGATIVE
Nitrite: NEGATIVE
Protein, ur: NEGATIVE mg/dL
Specific Gravity, Urine: 1.012 (ref 1.005–1.030)
pH: 8 (ref 5.0–8.0)

## 2019-11-06 LAB — COMPREHENSIVE METABOLIC PANEL
ALT: 24 U/L (ref 0–44)
AST: 28 U/L (ref 15–41)
Albumin: 4.3 g/dL (ref 3.5–5.0)
Alkaline Phosphatase: 63 U/L (ref 38–126)
Anion gap: 9 (ref 5–15)
BUN: 14 mg/dL (ref 8–23)
CO2: 31 mmol/L (ref 22–32)
Calcium: 10 mg/dL (ref 8.9–10.3)
Chloride: 105 mmol/L (ref 98–111)
Creatinine, Ser: 1.11 mg/dL — ABNORMAL HIGH (ref 0.44–1.00)
GFR calc Af Amer: 57 mL/min — ABNORMAL LOW (ref >60)
GFR calc non Af Amer: 50 mL/min — ABNORMAL LOW (ref >60)
Glucose, Bld: 95 mg/dL (ref 70–99)
Potassium: 4 mmol/L (ref 3.5–5.1)
Sodium: 145 mmol/L (ref 135–145)
Total Bilirubin: 0.5 mg/dL (ref 0.3–1.2)
Total Protein: 7.8 g/dL (ref 6.5–8.1)

## 2019-11-06 MED ORDER — ACETAMINOPHEN 500 MG PO TABS
1000.0000 mg | ORAL_TABLET | Freq: Once | ORAL | Status: AC
  Administered 2019-11-06: 1000 mg via ORAL
  Filled 2019-11-06: qty 2

## 2019-11-06 MED ORDER — SODIUM CHLORIDE 0.9 % IV BOLUS
500.0000 mL | Freq: Once | INTRAVENOUS | Status: AC
  Administered 2019-11-06: 500 mL via INTRAVENOUS

## 2019-11-06 MED ORDER — PREDNISONE 20 MG PO TABS
60.0000 mg | ORAL_TABLET | Freq: Once | ORAL | Status: AC
  Administered 2019-11-06: 60 mg via ORAL
  Filled 2019-11-06: qty 3

## 2019-11-06 NOTE — ED Triage Notes (Signed)
Patient is from home and transported via [facility]. EMS states patient is complaining of generalized weakness and pain, mostly in bilateral lower extremities, for the last few days. Also, complains of constipation. She lives at home alone and uses a walker. EMS visualize patient ambulating a few steps with walker.

## 2019-11-06 NOTE — ED Notes (Signed)
Attempted to start an IV, but unsuccessful.

## 2019-11-06 NOTE — ED Notes (Signed)
Attempted to call report, Delana Meyer RN is going to call back.

## 2019-11-06 NOTE — ED Provider Notes (Signed)
[facility] DEPT Provider Note   CSN: [id number] Arrival date & time: 11/06/19  1416     History Chief Complaint  Patient presents with  . Generalized weakness    Judy [name] is a 74 y.o. female.  Judy [name] is a 74 y.o. female with history of hypertension, hyperlipidemia, vertigo, bipolar disorder, anxiety, who presents to the emergency department via EMS for evaluation of generalized weakness.  Patient states that over the last few days she has felt increasingly weak.  She reports that she has pain in bilateral lower extremities that has been ongoing but worsening recently and today when she tried to get up and walk both of her legs gave out and she fell backwards onto her bed, fortunately she did not injure herself.  At baseline patient walks with a walker but states it has become increasingly difficult due to weakness and pain in her legs.  She reports some decreased sensation.  Denies changes in bowel or bladder control, patient wears a depends at baseline.  She has had some constipation but denies abdominal pain, vomiting.  She denies any fevers or chills.  Reports some urinary frequency, but denies dysuria.  She has not had any cough, chest pain or shortness of breath and denies any known sick contacts.  Reports she currently lives alone.        Past Medical History:  Diagnosis Date  . Allergic rhinitis   . Anemia   . Anxiety   . Arthritis   . Bipolar 1 disorder ([place])   . Depression   . Diverticulosis of colon (without mention of hemorrhage) 08/25/2011   [name]  . 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 Hyperlipidemia   . Hypertension   . Obesity   . Renal disorder   . Vertigo     Patient Active Problem List   Diagnosis Date Noted  . Generalized anxiety disorder 07/24/2018  . Solitary pulmonary nodule on lung CT 06/13/2017  . Spinal stenosis at L4-L5 level   . Dysphagia 05/25/2017  . Tardive dyskinesia 05/25/2017  .  Dyspnea on exertion 05/25/2017  . Fall 05/25/2017  . Bilateral leg pain   . Chronic pain of right knee 03/25/2017  . High risk medication use 03/25/2017  . Family hx of colon cancer 03/25/2017  . Occupational exposure in workplace 03/25/2017  . Depressed bipolar I disorder in partial remission ([place]) 03/25/2017  . Routine general medical examination at a health care facility 06/04/2014  . Hyperlipidemia with target LDL less than 130 03/20/2014  . Essential hypertension, benign 03/20/2014  . Unspecified constipation 03/20/2014  . Light headedness 03/20/2014  . Acute on chronic kidney disease, stage 3 03/20/2014  . Hypokalemia 03/20/2014  . Encounter for therapeutic drug monitoring 03/20/2014  . Other dysphagia 12/26/2013  . GERD (gastroesophageal reflux disease) 12/21/2013  . Constipation 12/21/2013  . Syncope 12/10/2013  . Leukocytosis 12/10/2013  . URI (upper respiratory infection) 12/10/2013  . Bipolar 1 disorder ([place])   . Obesity (BMI 30-39.9)   . Hearing loss in left ear   . Vertigo   . HYPERCHOLESTEROLEMIA 10/08/2010  . DEPRESSION 10/08/2010  . HYPERTENSION 10/08/2010  . ALLERGIC RHINITIS 10/08/2010    Past Surgical History:  Procedure Laterality Date  . COLONOSCOPY  08/25/2011   Procedure: COLONOSCOPY;  Surgeon: Landry Dyke, MD;  Location: [facility];  Service: Endoscopy;  Laterality: N/A;  . DILATION AND CURETTAGE OF UTERUS  1960's  OB History   No obstetric history on file.     Family History  Problem Relation Age of Onset  . Prostate cancer Brother   . Hypertension Brother   . Kidney disease Brother   . Colon cancer Brother   . Hypertension Mother   . Kidney disease Mother   . Stomach cancer Father   . Hyperlipidemia Sister   . Hypothyroidism Sister   . Stroke Brother   . Prostate cancer Brother     Social History   Tobacco Use  . Smoking status: Never Smoker  . Smokeless tobacco: Never Used  Substance Use Topics  . Alcohol use: No    . Drug use: No    Home Medications Prior to Admission medications   Medication Sig Start Date End Date Taking? Authorizing Provider  amLODipine (NORVASC) 5 MG tablet TAKE 1 TABLET BY MOUTH EVERY DAY Patient taking differently: Take 5 mg by mouth daily.  10/15/19  Yes [name], FNP  atorvastatin (LIPITOR) 10 MG tablet Take 1 tablet (10 mg total) by mouth daily. 11/01/19 10/31/20 Yes [name], FNP  buPROPion (WELLBUTRIN XL) 150 MG 24 hr tablet TAKE 1 TABLET BY MOUTH EVERY DAY IN THE MORNING Patient taking differently: Take 150 mg by mouth daily.  09/16/19  Yes Donnal Moat T, PA-C  gabapentin (NEURONTIN) 300 MG capsule Take 1 capsule (300 mg total) by mouth 3 (three) times daily. 08/09/19  Yes Hurst, Teresa T, PA-C  LATUDA 120 MG TABS TAKE 1 TABLET BY MOUTH EVERY DAY WITH EVENING MEAL Patient taking differently: Take 120 mg by mouth at bedtime.  10/27/19  Yes Hurst, Dorothea Glassman, PA-C  meclizine (ANTIVERT) 25 MG tablet Take 1 tablet (25 mg total) by mouth 3 (three) times daily as needed for dizziness. 07/08/18  Yes Jola Schmidt, MD  Multiple Vitamin (DAILY VITE PO) Take 1 tablet by mouth daily.   Yes [provider]  Valbenazine Tosylate (INGREZZA) 40 MG CAPS Take 40 mg by mouth daily. 09/05/19  Yes Hurst, Helene Kelp T, PA-C  Vitamin D, Ergocalciferol, (DRISDOL) 1.25 MG (50000 UNIT) CAPS capsule TAKE 1 CAPSULE (50,000 UNITS TOTAL) BY MOUTH EVERY 7 (SEVEN) DAYS. Patient taking differently: Take 50,000 Units by mouth every 7 (seven) days. sunday 10/27/19   Addison Lank, PA-C    Allergies    Patient has no known allergies.  Review of Systems   Review of Systems  Constitutional: Negative for chills and fever.  HENT: Negative.   Respiratory: Negative for cough and shortness of breath.   Cardiovascular: Negative for chest pain and leg swelling.  Gastrointestinal: Positive for constipation. Negative for abdominal pain, diarrhea, nausea and vomiting.  Genitourinary: Positive for  frequency. Negative for dysuria.  Musculoskeletal: Positive for arthralgias and myalgias.  Skin: Negative for color change and rash.  Neurological: Positive for weakness (Generalized). Negative for dizziness, facial asymmetry, speech difficulty, light-headedness, numbness and headaches.    Physical Exam Updated Vital Signs BP 118/63 (BP Location: Right Arm)   Pulse 61   Temp 98.9 F (37.2 C) (Oral)   Resp 17   SpO2 99%   Physical Exam Vitals and nursing note reviewed.  Constitutional:      General: She is not in acute distress.    Appearance: Normal appearance. She is well-developed. She is not ill-appearing or diaphoretic.  HENT:     Head: Normocephalic and atraumatic.  Eyes:     General:        Right eye: No discharge.  Left eye: No discharge.     Conjunctiva/sclera: Conjunctivae normal.  Cardiovascular:     Rate and Rhythm: Normal rate and regular rhythm.     Heart sounds: Normal heart sounds. No murmur. No friction rub. No gallop.   Pulmonary:     Effort: Pulmonary effort is normal. No respiratory distress.     Breath sounds: Normal breath sounds. No wheezing or rales.     Comments: Respirations equal and unlabored, patient able to speak in full sentences, lungs clear to auscultation bilaterally Abdominal:     General: Bowel sounds are normal. There is no distension.     Palpations: Abdomen is soft. There is no mass.     Tenderness: There is no abdominal tenderness. There is no guarding.     Comments: Abdomen soft, nondistended, nontender to palpation in all quadrants without guarding or peritoneal signs  Musculoskeletal:        General: No deformity.     Cervical back: Neck supple.     Comments: Bilateral lower extremities are warm and well perfused with 2+ DP pulses, no swelling or skin breakdown noted  Skin:    General: Skin is warm and dry.     Capillary Refill: Capillary refill takes less than 2 seconds.  Neurological:     Mental Status: She is alert.      Coordination: Coordination normal.     Comments: Speech is clear, able to follow commands CN III-XII intact Normal strength in upper and lower extremities bilaterally including dorsiflexion and plantar flexion, strong and equal grip strength Sensation intact in all extremities, but somewhat decreased in the distal lower extremities Moves extremities without ataxia, coordination intact     ED Results / Procedures / Treatments   Labs (all labs ordered are listed, but only abnormal results are displayed) Labs Reviewed  COMPREHENSIVE METABOLIC PANEL - Abnormal; Notable for the following components:      Result Value   Creatinine, Ser 1.11 (*)    GFR calc non Af Amer 50 (*)    GFR calc Af Amer 57 (*)    All other components within normal limits  SARS CORONAVIRUS 2 (TAT 6-24 HRS)  CBC WITH DIFFERENTIAL/PLATELET  URINALYSIS, ROUTINE W REFLEX MICROSCOPIC    EKG None  Radiology DG Chest Port 1 View  Result Date: 11/06/2019 CLINICAL DATA:  Generalized weakness. EXAM: PORTABLE CHEST 1 VIEW COMPARISON:  June 14, 2019 FINDINGS: There is no evidence of acute infiltrate, pleural effusion or pneumothorax. The heart size and mediastinal contours are within normal limits. There is tortuosity of the descending thoracic aorta. The visualized skeletal structures are unremarkable. IMPRESSION: No active disease. Electronically Signed   By: Virgina Norfolk M.D.   On: 11/06/2019 17:55    Procedures Procedures (including critical care time)  Medications Ordered in ED Medications  sodium chloride 0.9 % bolus 500 mL (0 mLs Intravenous Stopped 11/06/19 1944)  acetaminophen (TYLENOL) tablet 1,000 mg (1,000 mg Oral Given 11/06/19 1748)  predniSONE (DELTASONE) tablet 60 mg (60 mg Oral Given 11/06/19 1748)    ED Course  I have reviewed the triage vital signs and the nursing notes.  Pertinent labs & imaging results that were available during my care of the patient were reviewed by me and considered  in my medical decision making (see chart for details).    MDM Rules/Calculators/A&P                      74 year old female presents with generalized weakness  and bilateral lower extremity pain.  She tried to stand up today but her legs gave out from under her and she fell backwards onto the bed.  She denies any focal infectious symptoms and has normal vitals on arrival and is well-appearing.  She has reported urinary frequency which could be contributing to her weakness, she does not have any chest pain, shortness of breath, cough or fever.  Patient appears to have been prescribed gabapentin previously, question whether she may have some chronic neuropathy.  Will check basic labs, urinalysis, EKG and chest x-ray.  Patient does not have focal weakness, feel this is unlikely cauda equina I do not think she needs emergent MRI.  Lab work is overall reassuring without leukocytosis, normal hemoglobin, mild bump in creatinine but no other electrolyte derangements.  Urinalysis without signs of infection.  Chest x-ray is clear and EKG without concerning changes or arrhythmia.  Nursing attempted to ambulate patient after she received a small fluid bolus, Tylenol for pain and prednisone to potentially help with neuropathy.  Patient was able to stand up but was not able to take a step and then sat back down and stated it felt like her legs were going to give out on her again.  I discussed the case with Dr. Vallery Ridge who evaluated the patient as well and recommends admission for generalized weakness and gait abnormalities.  Patient currently walks with a walker and lives at home by herself and she feels she is high risk for falls in current state.  Case discussed with Dr. Jonnie Finner who will see and admit the patient.   Final Clinical Impression(s) / ED Diagnoses Final diagnoses:  Generalized weakness  Unable to ambulate    Rx / DC Orders ED Discharge Orders    None       Janet Berlin 11/06/19  2210    [name], MD 11/09/19 [phone]

## 2019-11-06 NOTE — ED Provider Notes (Signed)
Medical screening examination/treatment/procedure(s) were conducted as a shared visit with non-physician practitioner(s) and myself.  I personally evaluated the patient during the encounter.    Patient reports she has become increasingly weak over the past several days.  She reports her legs feel weak.  Right somewhat greater than left but it is both.  She describes the entirety of the legs feeling weak and some pain that again encompasses most of both legs.  She reports her legs gave out on her today.  She is afraid that she will fall at home and not be able to get back up.  Patient is alert and appropriate.  Mental status is clear.  No respiratory distress.  Heart regular.  Abdomen soft nontender.  Legs are symmetric in appearance.  No significant peripheral edema.  No wounds.  No significant venous stasis.  Feet are warm and dry.  Distal pulses are 2+ and symmetric.  Patient is able to move and elevate both lower extremities at command.  No signs of vascular compromise.  Symptoms are not consistent with acute stroke.  This has been indolent and progressive.  This does involve both legs.  Patient does not have significant back pain or acute weakness that would suggest emergent spinal cord involvement I agree with plan of management.   [name], MD 11/09/19 [phone]

## 2019-11-06 NOTE — ED Notes (Signed)
Attempted to ambulate patient, but it was unsuccessful. Patient was able to stand with walker on her own, but would not take a step, despite encouragement. Informed provider.

## 2019-11-06 NOTE — H&P (Signed)
History and Physical    Judy [name] MOQ:[id number] DOB: 06-21-1946 DOA: 11/06/2019  PCP: [name], FNP Patient coming from: Home  I have personally briefly reviewed patient's old medical records in [place]  Chief Complaint: Leg pain and weakness  HPI: Judy [name] is a 74 y.o. female with medical history significant for Bipolar 1 disorder, HTN, HLD, vertigo, obesity and L4-L5 spinal stenosis who presents from home via EMS for worsening pain and weakness in her bilateral lower extremities. She reports that she was unable to ambulate today at home. She usually ambulates with a walker. She is usually able to complete most of her ADLs but does have significant challenges with showering. She is not able to complete her iADLs. She is followed by [facility]. She presented to the ED on 1/13 with similar complaints. Denies fecal incontinence or urinary retention. No headache, vision changes, facial droop, slurred speech, weakness/numbness in her upper extremities, vertiginous symptoms. No recent falls or syncopal episodes. Prior imaging of her lumbar spine in 2018 showed severe canal stenosis at L4-L5 due to lumbar DJD.   ED Course: In the ED she was afebrile, mildly bradycardic, normotensive and saturating well on room airs. Labs are unremarkable. CXR showed no abnormality. EKG showed sinus rhythm with PACs. She received prednisone 60 mg, Tylenol and 500 cc NS bolus while in the ED.  Review of Systems: As per HPI otherwise 10 point review of systems negative.   Past Medical History:  Diagnosis Date  . Allergic rhinitis   . Anemia   . Anxiety   . Arthritis   . Bipolar 1 disorder ([place])   . Depression   . Diverticulosis of colon (without mention of hemorrhage) 08/25/2011   [name]  . 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 Hyperlipidemia   . Hypertension   . Obesity   . Renal disorder   . Vertigo     Past Surgical History:  Procedure Laterality Date    . COLONOSCOPY  08/25/2011   Procedure: COLONOSCOPY;  Surgeon: Landry Dyke, MD;  Location: [facility];  Service: Endoscopy;  Laterality: N/A;  . DILATION AND CURETTAGE OF UTERUS  1960's     reports that she has never smoked. She has never used smokeless tobacco. She reports that she does not drink alcohol or use drugs.  No Known Allergies  Family History  Problem Relation Age of Onset  . Prostate cancer Brother   . Hypertension Brother   . Kidney disease Brother   . Colon cancer Brother   . Hypertension Mother   . Kidney disease Mother   . Stomach cancer Father   . Hyperlipidemia Sister   . Hypothyroidism Sister   . Stroke Brother   . Prostate cancer Brother     Prior to Admission medications   Medication Sig Start Date End Date Taking? Authorizing Provider  amLODipine (NORVASC) 5 MG tablet TAKE 1 TABLET BY MOUTH EVERY DAY Patient taking differently: Take 5 mg by mouth daily.  10/15/19  Yes [name], FNP  atorvastatin (LIPITOR) 10 MG tablet Take 1 tablet (10 mg total) by mouth daily. 11/01/19 10/31/20 Yes [name], FNP  buPROPion (WELLBUTRIN XL) 150 MG 24 hr tablet TAKE 1 TABLET BY MOUTH EVERY DAY IN THE MORNING Patient taking differently: Take 150 mg by mouth daily.  09/16/19  Yes Donnal Moat T, PA-C  gabapentin (NEURONTIN) 300 MG capsule Take 1 capsule (300 mg total) by mouth 3 (  three) times daily. 08/09/19  Yes Hurst, Teresa T, PA-C  LATUDA 120 MG TABS TAKE 1 TABLET BY MOUTH EVERY DAY WITH EVENING MEAL Patient taking differently: Take 120 mg by mouth at bedtime.  10/27/19  Yes Hurst, Dorothea Glassman, PA-C  meclizine (ANTIVERT) 25 MG tablet Take 1 tablet (25 mg total) by mouth 3 (three) times daily as needed for dizziness. 07/08/18  Yes Jola Schmidt, MD  Multiple Vitamin (DAILY VITE PO) Take 1 tablet by mouth daily.   Yes [provider]  Valbenazine Tosylate (INGREZZA) 40 MG CAPS Take 40 mg by mouth daily. 09/05/19  Yes Hurst, Helene Kelp T, PA-C  Vitamin D,  Ergocalciferol, (DRISDOL) 1.25 MG (50000 UNIT) CAPS capsule TAKE 1 CAPSULE (50,000 UNITS TOTAL) BY MOUTH EVERY 7 (SEVEN) DAYS. Patient taking differently: Take 50,000 Units by mouth every 7 (seven) days. sunday 10/27/19   Addison Lank, PA-C    Physical Exam: Vitals:   11/06/19 1700 11/06/19 1830 11/06/19 1915 11/06/19 2000  BP: (!) 144/83 138/70 135/65 117/72  Pulse: 66 63 (!) 57 (!) 41  Resp: 20 (!) 22 (!) 29 20  Temp:      TempSrc:      SpO2: 100% 100% 100% 100%    {Constitutional: NAD, calm, comfortable Eyes: PERRL, lids and conjunctivae normal ENMT: Mucous membranes are moist. Posterior pharynx clear of any exudate or lesions. Neck: normal, supple, no masses Respiratory: clear to auscultation bilaterally, no wheezing, no crackles Cardiovascular: Regular rate and rhythm, no murmurs / rubs / gallops. No extremity edema. 2+ pedal pulses. Abdomen: no tenderness, no masses palpated. Bowel sounds positive.  Musculoskeletal: no clubbing / cyanosis. No joint deformity upper and lower extremities. Normal muscle tone.  Skin: no rashes, lesions, ulcers. Neurologic: CN 2-12 grossly intact. 3/5 strength in BLE, mildly weaker on left as compared to right. 4/5 strength in BUE. Decreased pinprick sensation below knees. No saddle anesthesia. Psychiatric: Normal judgment and insight. Alert and oriented x 3. Normal mood.   Labs on Admission: I have personally reviewed following labs and imaging studies  CBC: Recent Labs  Lab 11/06/19 1704  WBC 6.9  NEUTROABS 3.7  HGB 14.5  HCT 44.4  MCV 93.5  PLT 725   Basic Metabolic Panel: Recent Labs  Lab 11/06/19 1704  NA 145  K 4.0  CL 105  CO2 31  GLUCOSE 95  BUN 14  CREATININE 1.11*  CALCIUM 10.0   GFR: CrCl cannot be calculated (Unknown ideal weight.). Liver Function Tests: Recent Labs  Lab 11/06/19 1704  AST 28  ALT 24  ALKPHOS 63  BILITOT 0.5  PROT 7.8  ALBUMIN 4.3   No results for input(s): LIPASE, AMYLASE in the last  168 hours. No results for input(s): AMMONIA in the last 168 hours. Coagulation Profile: No results for input(s): INR, PROTIME in the last 168 hours. Cardiac Enzymes: No results for input(s): CKTOTAL, CKMB, CKMBINDEX, TROPONINI in the last 168 hours. BNP (last 3 results) No results for input(s): PROBNP in the last 8760 hours. HbA1C: No results for input(s): HGBA1C in the last 72 hours. CBG: No results for input(s): GLUCAP in the last 168 hours. Lipid Profile: No results for input(s): CHOL, HDL, LDLCALC, TRIG, CHOLHDL, LDLDIRECT in the last 72 hours. Thyroid Function Tests: No results for input(s): TSH, T4TOTAL, FREET4, T3FREE, THYROIDAB in the last 72 hours. Anemia Panel: No results for input(s): VITAMINB12, FOLATE, FERRITIN, TIBC, IRON, RETICCTPCT in the last 72 hours. Urine analysis:    Component Value Date/Time  COLORURINE YELLOW 11/06/2019 [place] 11/06/2019 1749   LABSPEC 1.012 11/06/2019 1749   PHURINE 8.0 11/06/2019 1749   GLUCOSEU NEGATIVE 11/06/2019 1749   HGBUR NEGATIVE 11/06/2019 [place] 11/06/2019 1749   BILIRUBINUR small 07/24/2018 [place] 11/06/2019 1749   PROTEINUR NEGATIVE 11/06/2019 1749   UROBILINOGEN 1.0 07/24/2018 1706   UROBILINOGEN 1.0 12/10/2013 2001   NITRITE NEGATIVE 11/06/2019 1749   LEUKOCYTESUR NEGATIVE 11/06/2019 1749    Radiological Exams on Admission: DG Chest Port 1 View  Result Date: 11/06/2019 CLINICAL DATA:  Generalized weakness. EXAM: PORTABLE CHEST 1 VIEW COMPARISON:  June 14, 2019 FINDINGS: There is no evidence of acute infiltrate, pleural effusion or pneumothorax. The heart size and mediastinal contours are within normal limits. There is tortuosity of the descending thoracic aorta. The visualized skeletal structures are unremarkable. IMPRESSION: No active disease. Electronically Signed   By: Virgina Norfolk M.D.   On: 11/06/2019 17:55   Assessment/Plan Active Problems:    Weakness of both lower extremities   Gait instability 2/2 pain and weakness in bilateral lower extremities H/o L4-L5 lumbar disc disease with spinal stenosis No alarm symptoms for cauda equina syndrome present. Musculoskeletal pain is present. Weakness seems functional in nature with likely contribution of long-standing spinal stenosis. Symptoms also consistent with neuropathy. Patient would benefit from physical therapy and assistance at home. Unsafe for discharge from ED due to lack of care at home. - Engage physical therapy - Defer further imaging of lower extremities or L-spine at this time - Could reasonably consider repeat MRI spine as outpatient - Continue home gabapentin - Pain control  Bipolar 1 disorder - Continue home Latuda, Ingrezza, Wellbutrin  HTN, HLD - Continue home amlodipine, statin  DVT prophylaxis: Lovenox Code Status: Full Disposition Plan: Home tomorrow Consults called: None Admission status: Obs   Bennie Pierini MD [facility]  If 7PM-7AM, please contact night-coverage [email or web address] Password [facility]  11/06/2019, 8:52 PM

## 2019-11-06 NOTE — Telephone Encounter (Signed)
Left patient a voicemail to call back

## 2019-11-06 NOTE — Telephone Encounter (Signed)
Reviewed

## 2019-11-06 NOTE — ED Notes (Signed)
Jasmine, RN called back to get report, unable to answer the phone due to care for another patient. Called back to the floor to give report. Jasmine, RN will call back for report.

## 2019-11-07 ENCOUNTER — Other Ambulatory Visit: Payer: Self-pay

## 2019-11-07 DIAGNOSIS — Z6829 Body mass index (BMI) 29.0-29.9, adult: Secondary | ICD-10-CM | POA: Diagnosis not present

## 2019-11-07 DIAGNOSIS — F411 Generalized anxiety disorder: Secondary | ICD-10-CM | POA: Diagnosis present

## 2019-11-07 DIAGNOSIS — M5186 Other intervertebral disc disorders, lumbar region: Secondary | ICD-10-CM | POA: Diagnosis present

## 2019-11-07 DIAGNOSIS — F319 Bipolar disorder, unspecified: Secondary | ICD-10-CM | POA: Diagnosis present

## 2019-11-07 DIAGNOSIS — M48062 Spinal stenosis, lumbar region with neurogenic claudication: Secondary | ICD-10-CM | POA: Diagnosis present

## 2019-11-07 DIAGNOSIS — Z800 Family history of malignant neoplasm of digestive organs: Secondary | ICD-10-CM | POA: Diagnosis not present

## 2019-11-07 DIAGNOSIS — H9192 Unspecified hearing loss, left ear: Secondary | ICD-10-CM | POA: Diagnosis present

## 2019-11-07 DIAGNOSIS — Z20822 Contact with and (suspected) exposure to covid-19: Secondary | ICD-10-CM | POA: Diagnosis present

## 2019-11-07 DIAGNOSIS — M4800 Spinal stenosis, site unspecified: Secondary | ICD-10-CM | POA: Diagnosis present

## 2019-11-07 DIAGNOSIS — E669 Obesity, unspecified: Secondary | ICD-10-CM | POA: Diagnosis present

## 2019-11-07 DIAGNOSIS — Z8249 Family history of ischemic heart disease and other diseases of the circulatory system: Secondary | ICD-10-CM | POA: Diagnosis not present

## 2019-11-07 DIAGNOSIS — K5900 Constipation, unspecified: Secondary | ICD-10-CM | POA: Diagnosis present

## 2019-11-07 DIAGNOSIS — R350 Frequency of micturition: Secondary | ICD-10-CM | POA: Diagnosis present

## 2019-11-07 DIAGNOSIS — Z841 Family history of disorders of kidney and ureter: Secondary | ICD-10-CM | POA: Diagnosis not present

## 2019-11-07 DIAGNOSIS — Z8349 Family history of other endocrine, nutritional and metabolic diseases: Secondary | ICD-10-CM | POA: Diagnosis not present

## 2019-11-07 DIAGNOSIS — K219 Gastro-esophageal reflux disease without esophagitis: Secondary | ICD-10-CM | POA: Diagnosis present

## 2019-11-07 DIAGNOSIS — I10 Essential (primary) hypertension: Secondary | ICD-10-CM | POA: Diagnosis present

## 2019-11-07 DIAGNOSIS — Z823 Family history of stroke: Secondary | ICD-10-CM | POA: Diagnosis not present

## 2019-11-07 DIAGNOSIS — Z23 Encounter for immunization: Secondary | ICD-10-CM | POA: Diagnosis present

## 2019-11-07 DIAGNOSIS — R531 Weakness: Secondary | ICD-10-CM

## 2019-11-07 DIAGNOSIS — E785 Hyperlipidemia, unspecified: Secondary | ICD-10-CM | POA: Diagnosis present

## 2019-11-07 DIAGNOSIS — W19XXXA Unspecified fall, initial encounter: Secondary | ICD-10-CM | POA: Diagnosis present

## 2019-11-07 DIAGNOSIS — Z8042 Family history of malignant neoplasm of prostate: Secondary | ICD-10-CM | POA: Diagnosis not present

## 2019-11-07 DIAGNOSIS — M1990 Unspecified osteoarthritis, unspecified site: Secondary | ICD-10-CM | POA: Diagnosis present

## 2019-11-07 DIAGNOSIS — Z79899 Other long term (current) drug therapy: Secondary | ICD-10-CM | POA: Diagnosis not present

## 2019-11-07 DIAGNOSIS — M4316 Spondylolisthesis, lumbar region: Secondary | ICD-10-CM | POA: Diagnosis present

## 2019-11-07 LAB — MAGNESIUM: Magnesium: 2.6 mg/dL — ABNORMAL HIGH (ref 1.7–2.4)

## 2019-11-07 LAB — PHOSPHORUS: Phosphorus: 2.9 mg/dL (ref 2.5–4.6)

## 2019-11-07 LAB — SARS CORONAVIRUS 2 (TAT 6-24 HRS): SARS Coronavirus 2: NEGATIVE

## 2019-11-07 MED ORDER — AMLODIPINE BESYLATE 5 MG PO TABS
5.0000 mg | ORAL_TABLET | Freq: Every day | ORAL | Status: DC
  Administered 2019-11-07 – 2019-11-08 (×2): 5 mg via ORAL
  Filled 2019-11-07 (×2): qty 1

## 2019-11-07 MED ORDER — ENOXAPARIN SODIUM 40 MG/0.4ML ~~LOC~~ SOLN
40.0000 mg | SUBCUTANEOUS | Status: DC
  Administered 2019-11-07 – 2019-11-08 (×2): 40 mg via SUBCUTANEOUS
  Filled 2019-11-07 (×2): qty 0.4

## 2019-11-07 MED ORDER — BUPROPION HCL ER (XL) 150 MG PO TB24
150.0000 mg | ORAL_TABLET | Freq: Every day | ORAL | Status: DC
  Administered 2019-11-07 – 2019-11-08 (×2): 150 mg via ORAL
  Filled 2019-11-07 (×2): qty 1

## 2019-11-07 MED ORDER — ATORVASTATIN CALCIUM 10 MG PO TABS
10.0000 mg | ORAL_TABLET | Freq: Every day | ORAL | Status: DC
  Administered 2019-11-07 – 2019-11-08 (×2): 10 mg via ORAL
  Filled 2019-11-07 (×2): qty 1

## 2019-11-07 MED ORDER — LURASIDONE HCL 40 MG PO TABS
120.0000 mg | ORAL_TABLET | Freq: Every day | ORAL | Status: DC
  Administered 2019-11-07 (×2): 120 mg via ORAL
  Filled 2019-11-07 (×3): qty 3

## 2019-11-07 MED ORDER — TRAMADOL HCL 50 MG PO TABS: 50.0000 mg | ORAL_TABLET | Freq: Four times a day (QID) | ORAL | Status: DC | PRN

## 2019-11-07 MED ORDER — VALBENAZINE TOSYLATE 40 MG PO CAPS
40.0000 mg | ORAL_CAPSULE | Freq: Every day | ORAL | Status: DC
  Administered 2019-11-08: 40 mg via ORAL

## 2019-11-07 MED ORDER — ACETAMINOPHEN 650 MG RE SUPP: 650.0000 mg | Freq: Four times a day (QID) | RECTAL | Status: DC | PRN

## 2019-11-07 MED ORDER — PNEUMOCOCCAL VAC POLYVALENT 25 MCG/0.5ML IJ INJ
0.5000 mL | INJECTION | INTRAMUSCULAR | Status: AC
  Administered 2019-11-08: 0.5 mL via INTRAMUSCULAR
  Filled 2019-11-07: qty 0.5

## 2019-11-07 MED ORDER — ADULT MULTIVITAMIN W/MINERALS CH
1.0000 | ORAL_TABLET | Freq: Every day | ORAL | Status: DC
  Administered 2019-11-07 – 2019-11-08 (×2): 1 via ORAL
  Filled 2019-11-07 (×2): qty 1

## 2019-11-07 MED ORDER — ACETAMINOPHEN 325 MG PO TABS: 650.0000 mg | ORAL_TABLET | Freq: Four times a day (QID) | ORAL | Status: DC | PRN

## 2019-11-07 MED ORDER — PREDNISONE 50 MG PO TABS
50.0000 mg | ORAL_TABLET | Freq: Every day | ORAL | Status: DC
  Administered 2019-11-08: 50 mg via ORAL
  Filled 2019-11-07: qty 1

## 2019-11-07 MED ORDER — POLYETHYLENE GLYCOL 3350 17 G PO PACK
17.0000 g | PACK | Freq: Every day | ORAL | Status: DC | PRN
  Administered 2019-11-08: 17 g via ORAL
  Filled 2019-11-07: qty 1

## 2019-11-07 MED ORDER — GABAPENTIN 300 MG PO CAPS
300.0000 mg | ORAL_CAPSULE | Freq: Three times a day (TID) | ORAL | Status: DC
  Administered 2019-11-07 – 2019-11-08 (×4): 300 mg via ORAL
  Filled 2019-11-07 (×4): qty 1

## 2019-11-07 NOTE — Progress Notes (Signed)
Hospitalist progress note   Patient from home, Patient going unclear, Dispo unclear at this time awaiting therapy eval  Judy [name] [id number] DOB: March 26, 1946 DOA: 11/06/2019  PCP: [name], FNP   Narrative:  58 black female depression hyperlipidemia obesity bipolar and constipation-admission 05/2017 for back pain and lower extremity weakness with severe spinal stenosis L4-L5 secondary to hypertrophied facet joints grade 1 spondylolisthesis-was offered surgery at the time but refused conservative measures were taken--- patient was recommended to have epidural steroid injections in the outpatient setting Read presented to the hospital on 1/26 with significant ADL issues, inability to ambulate She was treated in the emergency room prednisone 60 Tylenol  Data Reviewed:  No labs today   Assessment & Plan:  Severe spinal stenosis and inability to ambulate She tells [place] that she is unable to walk but then also states that she wants to get up and go to the restroom We will await nursing mobility today and if she fails then she will need PT to evaluate her formally Continue prednisone 60 at this time-already on gabapentin 300 3 times daily and bupropion which should help with neuropathic component to pain Can also continue tramadol 50 every 6 as needed Bipolar with prior MDD and suicidality Continue bupropion XL 150 daily, valbenazine 40 daily, Latuda 120 at bedtime Hyperlipidemia, HTN Continue amlodipine 5 daily, atorvastatin 10 daily Constipation Continue MiraLAX 17 g daily as needed  No family present at bedside will update once clear idea regarding function and functional deficits either with therapy or nursing mobility  Subjective: Awake coherent but slow mentation and sometimes is inconsistent in her responses Able to lift legs off of the bed on command but hesitates a little Consultants:   None yet  Objective: Vitals:   11/06/19 2239 11/06/19 2250 11/07/19 0500 11/07/19  0610  BP: (!) 146/77 (!) 146/77  130/73  Pulse: 60 60  60  Resp: 14 14  16   Temp: 98.2 F (36.8 C) 98.2 F (36.8 C)  98.2 F (36.8 C)  TempSrc: Oral Oral  Oral  SpO2: 97%   96%  Weight:  78.2 kg 78.2 kg   Height:  5\' 4"  (1.626 m)      Intake/Output Summary (Last 24 hours) at 11/07/2019 1014 Last data filed at 11/07/2019 0900 Gross per 24 hour  Intake 60 ml  Output 900 ml  Net -840 ml   Filed Weights   11/06/19 2250 11/07/19 0500  Weight: 78.2 kg 78.2 kg    Examination: EOMI NCAT slow mentation neck soft supple S1-S2 no murmur Chest is clear without added sound Abdomen soft nontender no rebound Power 5/5 to lower extremities SLR without pain able to extend leg and flex Did not examine lower back  Scheduled Meds: . amLODipine  5 mg Oral Daily  . atorvastatin  10 mg Oral Daily  . buPROPion  150 mg Oral Daily  . enoxaparin (LOVENOX) injection  40 mg Subcutaneous Q24H  . gabapentin  300 mg Oral TID  . lurasidone  120 mg Oral QHS  . multivitamin with minerals  1 tablet Oral Daily  . [START ON 11/08/2019] pneumococcal 23 valent vaccine  0.5 mL Intramuscular Tomorrow-1000  . Valbenazine Tosylate  40 mg Oral Daily   Continuous Infusions:   LOS: 0 days   Time spent: [place], MD [facility]  11/07/2019, 10:14 AM

## 2019-11-07 NOTE — Evaluation (Signed)
Physical Therapy Evaluation Patient Details Name: Judy [name] MRN: [id number] DOB: 02-Feb-1946 Today's Date: 11/07/2019   History of Present Illness  Patient is 74 y.o. female with PMH significant for depression, hyperlipidemia, obesit,y bipolar. Patient admitted 05/2017 for back pain and lower extremity weakness with severe spinal stenosis L4-L5 secondary to hypertrophied facet joints grade 1 spondylolisthesis-was offered surgery but pt declined at the time. Patient presented to the hospital on 1/26 with significant ADL issues, inability to ambulate. Admitted for LE weakness.    Clinical Impression  Judy [name] is 74 y.o. female admitted with above HPI and diagnosis. Patient is currently limited by functional impairments below (see PT problem list). Patient lives alone and is modified independent with Rollator for mobility at baseline. Patient will benefit from continued skilled PT interventions to address impairments and progress independence with mobility, recommending SNF. Acute PT will follow and progress as able.     Follow Up Recommendations SNF;Supervision/Assistance - 24 hour    Equipment Recommendations  None recommended by PT    Recommendations for Other Services       Precautions / Restrictions Precautions Precautions: Fall Restrictions Weight Bearing Restrictions: No      Mobility  Bed Mobility Overal bed mobility: Needs Assistance             General bed mobility comments: pt OOB on toilet with RN at start of session (RN reports pt was slow to mobilize but no assist required, just supervision for safety)  Transfers Overall transfer level: Needs assistance Equipment used: Rolling walker (2 wheeled) Transfers: Sit to/from Stand Sit to Stand: Min assist         General transfer comment: cues for hand placement on grab bar to initiate stand from toilet. Min assit required to steady with rise. Pt with safe technique for reach back to recliner to control  lowering to chair.  Ambulation/Gait Ambulation/Gait assistance: Min assist Gait Distance (Feet): 50 Feet Assistive device: Rolling walker (2 wheeled) Gait Pattern/deviations: Step-to pattern;Decreased stride length;Trunk flexed;Wide base of support Gait velocity: decreased   General Gait Details: pt very slow to mobilize; cues required for safe hand placement and posture in RW throughout gait. Assist required to steady and cues to direct turning with RW.  Stairs            Wheelchair Mobility    Modified Rankin (Stroke Patients Only)       Balance Overall balance assessment: Needs assistance Sitting-balance support: Feet supported Sitting balance-Leahy Scale: Fair     Standing balance support: During functional activity;Bilateral upper extremity supported Standing balance-Leahy Scale: Poor              Pertinent Vitals/Pain      Home Living Family/patient expects to be discharged to:: Private residence Living Arrangements: Alone Available Help at Discharge: Available PRN/intermittently;Family(reports her granddaugther works and can assist occasionally, he son is not physically able to assist her.) Type of Home: Apartment Home Access: Stairs to enter Entrance Stairs-Rails: (unsure if rails present, pt stating "I don't need those") Entrance Stairs-Number of Steps: 3 Home Layout: One level Home Equipment: Walker - 4 wheels      Prior Function Level of Independence: Independent with assistive device(s)         Comments: pt reports she uses rollator and occasionally SPC to mobilize     Hand Dominance   Dominant Hand: Right    Extremity/Trunk Assessment  Communication   Communication: HOH  Cognition Arousal/Alertness: Awake/alert Behavior During Therapy: WFL for tasks assessed/performed Overall Cognitive Status: No family/caregiver present to determine baseline cognitive functioning          General Comments: pt slow with  processing, follows simple/complex commands with extra time.      General Comments      Exercises     Assessment/Plan    PT Assessment Patient needs continued PT services  PT Problem List Decreased strength;Decreased balance;Decreased mobility;Decreased activity tolerance;Decreased knowledge of use of DME;Decreased safety awareness       PT Treatment Interventions DME instruction;Functional mobility training;Balance training;Patient/family education;Therapeutic activities;Gait training;Stair training;Therapeutic exercise    PT Goals (Current goals can be found in the Care Plan section)  Acute Rehab PT Goals Patient Stated Goal: to improve independence PT Goal Formulation: With patient Time For Goal Achievement: 11/21/19 Potential to Achieve Goals: Good    Frequency Min 3X/week    AM-PAC PT "6 Clicks" Mobility  Outcome Measure Help needed turning from your back to your side while in a flat bed without using bedrails?: A Little Help needed moving from lying on your back to sitting on the side of a flat bed without using bedrails?: A Little Help needed moving to and from a bed to a chair (including a wheelchair)?: A Little Help needed standing up from a chair using your arms (e.g., wheelchair or bedside chair)?: A Little Help needed to walk in hospital room?: A Little Help needed climbing 3-5 steps with a railing? : A Lot 6 Click Score: 17    End of Session Equipment Utilized During Treatment: Gait belt Activity Tolerance: Patient tolerated treatment well Patient left: with call bell/phone within reach;in chair;with chair alarm set Nurse Communication: Mobility status PT Visit Diagnosis: Muscle weakness (generalized) (M62.81);Difficulty in walking, not elsewhere classified (R26.2);Unsteadiness on feet (R26.81)    Time: 9735-3299 PT Time Calculation (min) (ACUTE ONLY): 31 min   Charges:   PT Evaluation $PT Eval Low Complexity: 1 Low PT Treatments $Gait Training: 8-22  mins       Verner Mould, DPT Physical Therapist with [facility] [phone]  11/07/2019 4:17 PM

## 2019-11-08 ENCOUNTER — Telehealth: Payer: Self-pay

## 2019-11-08 LAB — GLUCOSE, CAPILLARY: Glucose-Capillary: 79 mg/dL (ref 70–99)

## 2019-11-08 MED ORDER — ACETAMINOPHEN 325 MG PO TABS: 650.0000 mg | ORAL_TABLET | Freq: Four times a day (QID) | ORAL | Status: DC | PRN

## 2019-11-08 MED ORDER — PREDNISONE 50 MG PO TABS: ORAL_TABLET | ORAL | 0 refills | Status: DC

## 2019-11-08 MED ORDER — INGREZZA 40 MG PO CAPS: 40.0000 mg | ORAL_CAPSULE | Freq: Every day | ORAL | 0 refills | Status: DC

## 2019-11-08 MED ORDER — BUPROPION HCL ER (XL) 150 MG PO TB24: 150.0000 mg | ORAL_TABLET | Freq: Every day | ORAL | 0 refills | Status: DC

## 2019-11-08 MED ORDER — LATUDA 120 MG PO TABS: 120.0000 mg | ORAL_TABLET | Freq: Every day | ORAL | 0 refills | Status: DC

## 2019-11-08 MED ORDER — TRAMADOL HCL 50 MG PO TABS: 50.0000 mg | ORAL_TABLET | Freq: Four times a day (QID) | ORAL | 0 refills | Status: DC | PRN

## 2019-11-08 NOTE — Plan of Care (Signed)

## 2019-11-08 NOTE — TOC Transition Note (Signed)
Transition of Care [facility]) - CM/SW Discharge Note   Patient Details  Name: Judy [name] MRN: [id number] Date of Birth: [birth year]  Transition of Care [facility]) CM/SW Contact:  Marquitta Persichetti, Marjie Skiff, RN Phone Number: 11/08/2019, 12:17 PM   Clinical Narrative:    Pt states she wants to go to SNF after working with Physical therapy and receiving recommendations for SNF. Pt gave permission to fax out FL2 to area facilities.  Pt given bed offer of [place]. Pt accepted bed offer. Pt will go to room 104P and RN can call report to  [phone]. [facility] to transport pt to [facility].

## 2019-11-08 NOTE — NC FL2 (Signed)
[place] MEDICAID FL2 LEVEL OF CARE SCREENING TOOL     IDENTIFICATION  Patient Name: Judy [name] Birthdate: 09/27/1946 Sex: female Admission Date (Current Location): 11/06/2019  [facility] and [place] Number:  Herbalist and Address:  [facility],  [place] [address], [place]      Provider Number: [id number]  Attending Physician Name and Address:  Nita Sells, MD  Relative Name and Phone Number:       Current Level of Care: Hospital Recommended Level of Care: [place] Prior Approval Number:    Date Approved/Denied:   PASRR Number: [id number]  Discharge Plan: SNF    Current Diagnoses: Patient Active Problem List   Diagnosis Date Noted  . Spinal stenosis 11/07/2019  . Weakness of both lower extremities 11/06/2019  . Generalized anxiety disorder 07/24/2018  . Solitary pulmonary nodule on lung CT 06/13/2017  . Spinal stenosis at L4-L5 level   . Dysphagia 05/25/2017  . Tardive dyskinesia 05/25/2017  . Dyspnea on exertion 05/25/2017  . Fall 05/25/2017  . Bilateral leg pain   . Chronic pain of right knee 03/25/2017  . High risk medication use 03/25/2017  . Family hx of colon cancer 03/25/2017  . Occupational exposure in workplace 03/25/2017  . Depressed bipolar I disorder in partial remission ([place]) 03/25/2017  . Routine general medical examination at a health care facility 06/04/2014  . Hyperlipidemia with target LDL less than 130 03/20/2014  . Essential hypertension, benign 03/20/2014  . Unspecified constipation 03/20/2014  . Light headedness 03/20/2014  . Acute on chronic kidney disease, stage 3 03/20/2014  . Hypokalemia 03/20/2014  . Encounter for therapeutic drug monitoring 03/20/2014  . Other dysphagia 12/26/2013  . GERD (gastroesophageal reflux disease) 12/21/2013  . Constipation 12/21/2013  . Syncope 12/10/2013  . Leukocytosis 12/10/2013  . URI (upper respiratory infection) 12/10/2013  . Bipolar 1  disorder ([place])   . Obesity (BMI 30-39.9)   . Hearing loss in left ear   . Vertigo   . HYPERCHOLESTEROLEMIA 10/08/2010  . DEPRESSION 10/08/2010  . HYPERTENSION 10/08/2010  . ALLERGIC RHINITIS 10/08/2010    Orientation RESPIRATION BLADDER Height & Weight     Self, Time, Situation, Place  Normal Continent Weight: 78.2 kg Height:  5\' 4"  (162.6 cm)  BEHAVIORAL SYMPTOMS/MOOD NEUROLOGICAL BOWEL NUTRITION STATUS      Continent Diet  AMBULATORY STATUS COMMUNICATION OF NEEDS Skin   Limited Assist Verbally Normal                       Personal Care Assistance Level of Assistance  Bathing, Dressing Bathing Assistance: Limited assistance   Dressing Assistance: Limited assistance     Functional Limitations Info  Hearing   Hearing Info: Impaired      SPECIAL CARE FACTORS FREQUENCY  PT (By licensed PT), OT (By licensed OT)     PT Frequency: 5 x weekly OT Frequency: 5 x weekly            Contractures Contractures Info: Not present    Additional Factors Info  Code Status, Allergies Code Status Info: Full Code Allergies Info: None known           Current Medications (11/08/2019):  This is the current hospital active medication list Current Facility-Administered Medications  Medication Dose Route Frequency Provider Last Rate Last Admin  . acetaminophen (TYLENOL) tablet 650 mg  650 mg Oral Q6H PRN Bennie Pierini, MD       Or  .  acetaminophen (TYLENOL) suppository 650 mg  650 mg Rectal Q6H PRN Bennie Pierini, MD      . amLODipine [facility]) tablet 5 mg  5 mg Oral Daily Bennie Pierini, MD   5 mg at 11/08/19 [phone]  . atorvastatin (LIPITOR) tablet 10 mg  10 mg Oral Daily Bennie Pierini, MD   10 mg at 11/08/19 [phone]  . buPROPion (WELLBUTRIN XL) 24 hr tablet 150 mg  150 mg Oral Daily Bennie Pierini, MD   150 mg at 11/08/19 7510  . enoxaparin (LOVENOX) injection 40 mg  40 mg Subcutaneous Q24H Bennie Pierini, MD   40 mg at 11/08/19 [phone]  . gabapentin  (NEURONTIN) capsule 300 mg  300 mg Oral TID Bennie Pierini, MD   300 mg at 11/08/19 [phone]  . lurasidone (LATUDA) tablet 120 mg  120 mg Oral QHS Bennie Pierini, MD   120 mg at 11/07/19 2204  . multivitamin with minerals tablet 1 tablet  1 tablet Oral Daily Bennie Pierini, MD   1 tablet at 11/08/19 [phone]  . pneumococcal 23 valent vaccine (PNEUMOVAX-23) injection 0.5 mL  0.5 mL Intramuscular Tomorrow-1000 [place], Michele Mcalpine, MD      . polyethylene glycol [facility] / GLYCOLAX) packet 17 g  17 g Oral Daily PRN Bennie Pierini, MD   17 g at 11/08/19 [phone]  . predniSONE (DELTASONE) tablet 50 mg  50 mg Oral Q breakfast Nita Sells, MD   50 mg at 11/08/19 0810  . traMADol (ULTRAM) tablet 50 mg  50 mg Oral Q6H PRN Bennie Pierini, MD      . Valbenazine Tosylate CAPS 40 mg  40 mg Oral Daily Bennie Pierini, MD         Discharge Medications: Please see discharge summary for a list of discharge medications.  Relevant Imaging Results:  Relevant Lab Results:   Additional Information ss# [id number]  Amilia Vandenbrink, Marjie Skiff, RN

## 2019-11-08 NOTE — Progress Notes (Signed)
Pt assessment and VS WNL. No PIV established during admission.  Pt to be transported via EMS to [facility]. All patient belongings, including home medications, were provided to EMS. Report called to receiving [place] RN at 1500.

## 2019-11-08 NOTE — Discharge Summary (Signed)
Physician Discharge Summary  Judy [name] YTK:[id number] DOB: [birth year] DOA: 11/06/2019  PCP: [name], FNP  Admit date: 11/06/2019 Discharge date: 11/08/2019  Time spent: 30 minutes  Recommendations for Outpatient Follow-up:  1. Patient will need outpatient consideration for steroid epidural injections if continues to have low back pain and this can be coordinated with radiologist in the outpatient setting 2. Recommend continuation of prednisone for 6 more days for acute pain in addition to tramadol 3. Refill all psychotropic medications on discharge 4. Needs CBC C met in 1 week  Discharge Diagnoses:  Active Problems:   Weakness of both lower extremities   Spinal stenosis   Discharge Condition: Improved  Diet recommendation: Heart healthy  Filed Weights   11/06/19 2250 11/07/19 0500  Weight: 78.2 kg 78.2 kg    History of present illness:  33 black female depression hyperlipidemia obesity bipolar and constipation-admission 05/2017 for back pain and lower extremity weakness with severe spinal stenosis L4-L5 secondary to hypertrophied facet joints grade 1 spondylolisthesis-was offered surgery at the time but refused conservative measures were taken--- patient was recommended to have epidural steroid injections in the outpatient setting Read presented to the hospital on 1/26 with significant ADL issues, inability to ambulate She was treated in the emergency room prednisone 60 Tylenol   Hospital Course:  Severe spinal stenosis and inability to ambulate Was able to ambulate but needed significant assistance and a walker which is unusual for her Continue prednisone and complete on 11/13/2019-already on gabapentin 300 3 times daily and bupropion which should help with neuropathic component to pain Can also continue tramadol 50 every 6 as needed-limited prescription given If continues to have pain and issues will need to be followed by radiologist or neurosurgery for consideration  of repair Bipolar with prior MDD and suicidality Continue bupropion XL 150 daily, valbenazine 40 daily, Latuda 120 at bedtime Hyperlipidemia, HTN Continue amlodipine 5 daily, atorvastatin 10 daily Constipation Continue MiraLAX 17 g daily as needed  Procedures:  None   Consultations:  None  Discharge Exam: Vitals:   11/07/19 2156 11/08/19 0603  BP: 131/63 123/60  Pulse: 62 (!) 51  Resp: 17 17  Temp: 99.1 F (37.3 C) 98.1 F (36.7 C)  SpO2: 100% 100%    General: Awake alert more coherent no distress Cardiovascular: S1-S2 no murmur rub or gallop Respiratory: Clinically clear no added sound ROM intact able to lift legs off bed sensory intact reflexes are equivocal bilaterally to knees she is able to dorsi and plantarflex  Discharge Instructions   Discharge Instructions    Diet - low sodium heart healthy   Complete by: As directed    Increase activity slowly   Complete by: As directed      Allergies as of 11/08/2019   No Known Allergies     Medication List    TAKE these medications   acetaminophen 325 MG tablet Commonly known as: TYLENOL Take 2 tablets (650 mg total) by mouth every 6 (six) hours as needed for mild pain (or Fever >/= 101).   amLODipine 5 MG tablet Commonly known as: NORVASC TAKE 1 TABLET BY MOUTH EVERY DAY   atorvastatin 10 MG tablet Commonly known as: Lipitor Take 1 tablet (10 mg total) by mouth daily.   buPROPion 150 MG 24 hr tablet Commonly known as: WELLBUTRIN XL Take 1 tablet (150 mg total) by mouth daily. What changed: See the new instructions.   DAILY VITE PO Take 1 tablet by mouth daily.   gabapentin 300  MG capsule Commonly known as: NEURONTIN Take 1 capsule (300 mg total) by mouth 3 (three) times daily.   Ingrezza 40 MG Caps Generic drug: Valbenazine Tosylate Take 40 mg by mouth daily.   Latuda 120 MG Tabs Generic drug: Lurasidone HCl Take 1 tablet (120 mg total) by mouth at bedtime. What changed: See the new  instructions.   meclizine 25 MG tablet Commonly known as: ANTIVERT Take 1 tablet (25 mg total) by mouth 3 (three) times daily as needed for dizziness.   predniSONE 50 MG tablet Commonly known as: DELTASONE Take one daily until all gone Start taking on: November 09, 2019   traMADol 50 MG tablet Commonly known as: ULTRAM Take 1 tablet (50 mg total) by mouth every 6 (six) hours as needed for moderate pain.   Vitamin D (Ergocalciferol) 1.25 MG (50000 UNIT) Caps capsule Commonly known as: DRISDOL TAKE 1 CAPSULE (50,000 UNITS TOTAL) BY MOUTH EVERY 7 (SEVEN) DAYS. What changed: additional instructions      No Known Allergies    The results of significant diagnostics from this hospitalization (including imaging, microbiology, ancillary and laboratory) are listed below for reference.    Significant Diagnostic Studies: DG Chest Port 1 View  Result Date: 11/06/2019 CLINICAL DATA:  Generalized weakness. EXAM: PORTABLE CHEST 1 VIEW COMPARISON:  June 14, 2019 FINDINGS: There is no evidence of acute infiltrate, pleural effusion or pneumothorax. The heart size and mediastinal contours are within normal limits. There is tortuosity of the descending thoracic aorta. The visualized skeletal structures are unremarkable. IMPRESSION: No active disease. Electronically Signed   By: Virgina Norfolk M.D.   On: 11/06/2019 17:55    Microbiology: Recent Results (from the past 240 hour(s))  SARS CORONAVIRUS 2 (TAT 6-24 HRS) Nasopharyngeal Nasopharyngeal Swab     Status: None   Collection Time: 11/06/19  8:17 PM   Specimen: Nasopharyngeal Swab  Result Value Ref Range Status   SARS Coronavirus 2 NEGATIVE NEGATIVE Final    Comment: (NOTE) SARS-CoV-2 target nucleic acids are NOT DETECTED. The SARS-CoV-2 RNA is generally detectable in upper and lower respiratory specimens during the acute phase of infection. Negative results do not preclude SARS-CoV-2 infection, do not rule out co-infections with  other pathogens, and should not be used as the sole basis for treatment or other patient management decisions. Negative results must be combined with clinical observations, patient history, and epidemiological information. The expected result is Negative. Fact Sheet for Patients: SugarRoll.be Fact Sheet for Healthcare Providers: https://www.woods-mathews.com/ This test is not yet approved or cleared by the [place] FDA and  has been authorized for detection and/or diagnosis of SARS-CoV-2 by FDA under an Emergency Use Authorization (EUA). This EUA will remain  in effect (meaning this test can be used) for the duration of the COVID-19 declaration under Section 56 4(b)(1) of the Act, 21 U.S.C. section 360bbb-3(b)(1), unless the authorization is terminated or revoked sooner. Performed at [facility], [place] [address] [place], [place] [address]      Labs: Basic Metabolic Panel: Recent Labs  Lab 11/06/19 0100 11/06/19 1704  NA  --  145  K  --  4.0  CL  --  105  CO2  --  31  GLUCOSE  --  95  BUN  --  14  CREATININE  --  1.11*  CALCIUM  --  10.0  MG 2.6*  --   PHOS 2.9  --    Liver Function Tests: Recent Labs  Lab 11/06/19 1704  AST  28  ALT 24  ALKPHOS 63  BILITOT 0.5  PROT 7.8  ALBUMIN 4.3   No results for input(s): LIPASE, AMYLASE in the last 168 hours. No results for input(s): AMMONIA in the last 168 hours. CBC: Recent Labs  Lab 11/06/19 1704  WBC 6.9  NEUTROABS 3.7  HGB 14.5  HCT 44.4  MCV 93.5  PLT 248   Cardiac Enzymes: No results for input(s): CKTOTAL, CKMB, CKMBINDEX, TROPONINI in the last 168 hours. BNP: BNP (last 3 results) No results for input(s): BNP in the last 8760 hours.  ProBNP (last 3 results) No results for input(s): PROBNP in the last 8760 hours.  CBG: Recent Labs  Lab 11/08/19 0729  GLUCAP 79       Signed:  Nita Sells MD   [facility] 11/08/2019, 12:23  PM

## 2019-11-08 NOTE — Progress Notes (Signed)
Called [facility] POA Leshae on phone number provided by patient left message for her to call back so that I could give medical update- Patient is medically stable for discharge to go to a facility medically whenever we can arrange a bed  Verneita Griffes, MD [facility] 8:58 AM

## 2019-11-12 ENCOUNTER — Encounter: Payer: Self-pay | Admitting: Nurse Practitioner

## 2019-11-12 ENCOUNTER — Telehealth: Payer: Self-pay

## 2019-11-12 DIAGNOSIS — R071 Chest pain on breathing: Secondary | ICD-10-CM | POA: Diagnosis not present

## 2019-11-12 DIAGNOSIS — R609 Edema, unspecified: Secondary | ICD-10-CM | POA: Diagnosis not present

## 2019-11-12 DIAGNOSIS — M438X4 Other specified deforming dorsopathies, thoracic region: Secondary | ICD-10-CM | POA: Diagnosis not present

## 2019-11-12 DIAGNOSIS — I2699 Other pulmonary embolism without acute cor pulmonale: Secondary | ICD-10-CM | POA: Diagnosis not present

## 2019-11-12 DIAGNOSIS — K5900 Constipation, unspecified: Secondary | ICD-10-CM | POA: Diagnosis not present

## 2019-11-12 DIAGNOSIS — R0682 Tachypnea, not elsewhere classified: Secondary | ICD-10-CM | POA: Diagnosis not present

## 2019-11-12 DIAGNOSIS — H9192 Unspecified hearing loss, left ear: Secondary | ICD-10-CM | POA: Diagnosis not present

## 2019-11-12 DIAGNOSIS — K5901 Slow transit constipation: Secondary | ICD-10-CM | POA: Diagnosis not present

## 2019-11-12 DIAGNOSIS — E559 Vitamin D deficiency, unspecified: Secondary | ICD-10-CM | POA: Diagnosis not present

## 2019-11-12 DIAGNOSIS — I959 Hypotension, unspecified: Secondary | ICD-10-CM | POA: Diagnosis not present

## 2019-11-12 DIAGNOSIS — R2681 Unsteadiness on feet: Secondary | ICD-10-CM | POA: Diagnosis not present

## 2019-11-12 DIAGNOSIS — I10 Essential (primary) hypertension: Secondary | ICD-10-CM | POA: Diagnosis not present

## 2019-11-12 DIAGNOSIS — M79604 Pain in right leg: Secondary | ICD-10-CM | POA: Diagnosis not present

## 2019-11-12 DIAGNOSIS — M48061 Spinal stenosis, lumbar region without neurogenic claudication: Secondary | ICD-10-CM | POA: Diagnosis not present

## 2019-11-12 DIAGNOSIS — Z823 Family history of stroke: Secondary | ICD-10-CM | POA: Diagnosis not present

## 2019-11-12 DIAGNOSIS — Z800 Family history of malignant neoplasm of digestive organs: Secondary | ICD-10-CM | POA: Diagnosis not present

## 2019-11-12 DIAGNOSIS — R2689 Other abnormalities of gait and mobility: Secondary | ICD-10-CM | POA: Diagnosis not present

## 2019-11-12 DIAGNOSIS — R0602 Shortness of breath: Secondary | ICD-10-CM | POA: Diagnosis not present

## 2019-11-12 DIAGNOSIS — M7989 Other specified soft tissue disorders: Secondary | ICD-10-CM | POA: Diagnosis not present

## 2019-11-12 DIAGNOSIS — R911 Solitary pulmonary nodule: Secondary | ICD-10-CM | POA: Diagnosis not present

## 2019-11-12 DIAGNOSIS — I2693 Single subsegmental pulmonary embolism without acute cor pulmonale: Secondary | ICD-10-CM | POA: Diagnosis not present

## 2019-11-12 DIAGNOSIS — M48062 Spinal stenosis, lumbar region with neurogenic claudication: Secondary | ICD-10-CM | POA: Diagnosis not present

## 2019-11-12 DIAGNOSIS — E785 Hyperlipidemia, unspecified: Secondary | ICD-10-CM | POA: Diagnosis not present

## 2019-11-12 DIAGNOSIS — Z743 Need for continuous supervision: Secondary | ICD-10-CM | POA: Diagnosis not present

## 2019-11-12 DIAGNOSIS — R278 Other lack of coordination: Secondary | ICD-10-CM | POA: Diagnosis not present

## 2019-11-12 DIAGNOSIS — Z79899 Other long term (current) drug therapy: Secondary | ICD-10-CM | POA: Diagnosis not present

## 2019-11-12 DIAGNOSIS — I2602 Saddle embolus of pulmonary artery with acute cor pulmonale: Secondary | ICD-10-CM | POA: Diagnosis not present

## 2019-11-12 DIAGNOSIS — I491 Atrial premature depolarization: Secondary | ICD-10-CM | POA: Diagnosis not present

## 2019-11-12 DIAGNOSIS — Z8249 Family history of ischemic heart disease and other diseases of the circulatory system: Secondary | ICD-10-CM | POA: Diagnosis not present

## 2019-11-12 DIAGNOSIS — Z66 Do not resuscitate: Secondary | ICD-10-CM | POA: Diagnosis not present

## 2019-11-12 DIAGNOSIS — Z8349 Family history of other endocrine, nutritional and metabolic diseases: Secondary | ICD-10-CM | POA: Diagnosis not present

## 2019-11-12 DIAGNOSIS — F319 Bipolar disorder, unspecified: Secondary | ICD-10-CM | POA: Diagnosis not present

## 2019-11-12 DIAGNOSIS — Z20822 Contact with and (suspected) exposure to covid-19: Secondary | ICD-10-CM | POA: Diagnosis not present

## 2019-11-12 DIAGNOSIS — M6281 Muscle weakness (generalized): Secondary | ICD-10-CM | POA: Diagnosis not present

## 2019-11-12 DIAGNOSIS — Z8042 Family history of malignant neoplasm of prostate: Secondary | ICD-10-CM | POA: Diagnosis not present

## 2019-11-12 DIAGNOSIS — R0600 Dyspnea, unspecified: Secondary | ICD-10-CM | POA: Diagnosis not present

## 2019-11-12 DIAGNOSIS — Z841 Family history of disorders of kidney and ureter: Secondary | ICD-10-CM | POA: Diagnosis not present

## 2019-11-12 NOTE — Telephone Encounter (Signed)
I called patient to see if she is coming to her appointment today so that we could do her hospital f/u and she stated she is in rehab I advised her to give [place] a call once she has left the hospital. [facility]

## 2019-11-13 DIAGNOSIS — K5901 Slow transit constipation: Secondary | ICD-10-CM | POA: Diagnosis not present

## 2019-11-13 DIAGNOSIS — M48062 Spinal stenosis, lumbar region with neurogenic claudication: Secondary | ICD-10-CM | POA: Diagnosis not present

## 2019-11-15 ENCOUNTER — Emergency Department (HOSPITAL_COMMUNITY): Payer: Medicare Other

## 2019-11-15 ENCOUNTER — Encounter (HOSPITAL_COMMUNITY): Payer: Self-pay

## 2019-11-15 ENCOUNTER — Inpatient Hospital Stay (HOSPITAL_COMMUNITY)
Admission: EM | Admit: 2019-11-15 | Discharge: 2019-11-20 | DRG: 176 | Disposition: A | Discharge status: Skilled Nursing Facility | Payer: Medicare Other | Source: Skilled Nursing Facility | Attending: Internal Medicine | Admitting: Internal Medicine

## 2019-11-15 ENCOUNTER — Other Ambulatory Visit: Payer: Self-pay

## 2019-11-15 ENCOUNTER — Ambulatory Visit (HOSPITAL_COMMUNITY): Payer: Medicare Other

## 2019-11-15 DIAGNOSIS — M2550 Pain in unspecified joint: Secondary | ICD-10-CM | POA: Diagnosis not present

## 2019-11-15 DIAGNOSIS — Z20822 Contact with and (suspected) exposure to covid-19: Secondary | ICD-10-CM | POA: Diagnosis not present

## 2019-11-15 DIAGNOSIS — Z8349 Family history of other endocrine, nutritional and metabolic diseases: Secondary | ICD-10-CM | POA: Diagnosis not present

## 2019-11-15 DIAGNOSIS — Z8042 Family history of malignant neoplasm of prostate: Secondary | ICD-10-CM

## 2019-11-15 DIAGNOSIS — M7989 Other specified soft tissue disorders: Secondary | ICD-10-CM

## 2019-11-15 DIAGNOSIS — M48061 Spinal stenosis, lumbar region without neurogenic claudication: Secondary | ICD-10-CM | POA: Diagnosis not present

## 2019-11-15 DIAGNOSIS — K5900 Constipation, unspecified: Secondary | ICD-10-CM | POA: Diagnosis present

## 2019-11-15 DIAGNOSIS — M438X4 Other specified deforming dorsopathies, thoracic region: Secondary | ICD-10-CM | POA: Diagnosis not present

## 2019-11-15 DIAGNOSIS — R609 Edema, unspecified: Secondary | ICD-10-CM | POA: Diagnosis not present

## 2019-11-15 DIAGNOSIS — Z7401 Bed confinement status: Secondary | ICD-10-CM | POA: Diagnosis not present

## 2019-11-15 DIAGNOSIS — I2693 Single subsegmental pulmonary embolism without acute cor pulmonale: Secondary | ICD-10-CM | POA: Diagnosis not present

## 2019-11-15 DIAGNOSIS — Z79899 Other long term (current) drug therapy: Secondary | ICD-10-CM

## 2019-11-15 DIAGNOSIS — E785 Hyperlipidemia, unspecified: Secondary | ICD-10-CM | POA: Diagnosis present

## 2019-11-15 DIAGNOSIS — Z800 Family history of malignant neoplasm of digestive organs: Secondary | ICD-10-CM | POA: Diagnosis not present

## 2019-11-15 DIAGNOSIS — Z841 Family history of disorders of kidney and ureter: Secondary | ICD-10-CM | POA: Diagnosis not present

## 2019-11-15 DIAGNOSIS — I10 Essential (primary) hypertension: Secondary | ICD-10-CM | POA: Diagnosis present

## 2019-11-15 DIAGNOSIS — R2689 Other abnormalities of gait and mobility: Secondary | ICD-10-CM | POA: Diagnosis not present

## 2019-11-15 DIAGNOSIS — F319 Bipolar disorder, unspecified: Secondary | ICD-10-CM | POA: Diagnosis present

## 2019-11-15 DIAGNOSIS — R911 Solitary pulmonary nodule: Secondary | ICD-10-CM | POA: Diagnosis not present

## 2019-11-15 DIAGNOSIS — H9192 Unspecified hearing loss, left ear: Secondary | ICD-10-CM | POA: Diagnosis not present

## 2019-11-15 DIAGNOSIS — R498 Other voice and resonance disorders: Secondary | ICD-10-CM | POA: Diagnosis not present

## 2019-11-15 DIAGNOSIS — R071 Chest pain on breathing: Secondary | ICD-10-CM | POA: Diagnosis not present

## 2019-11-15 DIAGNOSIS — R2681 Unsteadiness on feet: Secondary | ICD-10-CM | POA: Diagnosis not present

## 2019-11-15 DIAGNOSIS — R0600 Dyspnea, unspecified: Secondary | ICD-10-CM | POA: Diagnosis not present

## 2019-11-15 DIAGNOSIS — E559 Vitamin D deficiency, unspecified: Secondary | ICD-10-CM | POA: Diagnosis present

## 2019-11-15 DIAGNOSIS — Z8249 Family history of ischemic heart disease and other diseases of the circulatory system: Secondary | ICD-10-CM

## 2019-11-15 DIAGNOSIS — M6281 Muscle weakness (generalized): Secondary | ICD-10-CM | POA: Diagnosis not present

## 2019-11-15 DIAGNOSIS — I2699 Other pulmonary embolism without acute cor pulmonale: Secondary | ICD-10-CM | POA: Diagnosis not present

## 2019-11-15 DIAGNOSIS — I491 Atrial premature depolarization: Secondary | ICD-10-CM | POA: Diagnosis not present

## 2019-11-15 DIAGNOSIS — I2602 Saddle embolus of pulmonary artery with acute cor pulmonale: Secondary | ICD-10-CM | POA: Diagnosis not present

## 2019-11-15 DIAGNOSIS — M79604 Pain in right leg: Secondary | ICD-10-CM | POA: Diagnosis not present

## 2019-11-15 DIAGNOSIS — M48062 Spinal stenosis, lumbar region with neurogenic claudication: Secondary | ICD-10-CM | POA: Diagnosis not present

## 2019-11-15 DIAGNOSIS — Z66 Do not resuscitate: Secondary | ICD-10-CM | POA: Diagnosis present

## 2019-11-15 DIAGNOSIS — I959 Hypotension, unspecified: Secondary | ICD-10-CM | POA: Diagnosis not present

## 2019-11-15 DIAGNOSIS — R0682 Tachypnea, not elsewhere classified: Secondary | ICD-10-CM | POA: Diagnosis not present

## 2019-11-15 DIAGNOSIS — E782 Mixed hyperlipidemia: Secondary | ICD-10-CM | POA: Diagnosis present

## 2019-11-15 DIAGNOSIS — Z743 Need for continuous supervision: Secondary | ICD-10-CM | POA: Diagnosis not present

## 2019-11-15 DIAGNOSIS — Z823 Family history of stroke: Secondary | ICD-10-CM | POA: Diagnosis not present

## 2019-11-15 DIAGNOSIS — R0602 Shortness of breath: Secondary | ICD-10-CM | POA: Diagnosis not present

## 2019-11-15 LAB — CBC WITH DIFFERENTIAL/PLATELET
Abs Immature Granulocytes: 0.03 10*3/uL (ref 0.00–0.07)
Basophils Absolute: 0 10*3/uL (ref 0.0–0.1)
Basophils Relative: 0 %
Eosinophils Absolute: 0 10*3/uL (ref 0.0–0.5)
Eosinophils Relative: 0 %
HCT: 45.9 % (ref 36.0–46.0)
Hemoglobin: 15.1 g/dL — ABNORMAL HIGH (ref 12.0–15.0)
Immature Granulocytes: 0 %
Lymphocytes Relative: 12 %
Lymphs Abs: 0.9 10*3/uL (ref 0.7–4.0)
MCH: 30.3 pg (ref 26.0–34.0)
MCHC: 32.9 g/dL (ref 30.0–36.0)
MCV: 92.2 fL (ref 80.0–100.0)
Monocytes Absolute: 0.2 10*3/uL (ref 0.1–1.0)
Monocytes Relative: 2 %
Neutro Abs: 7.1 10*3/uL (ref 1.7–7.7)
Neutrophils Relative %: 86 %
Platelets: 246 10*3/uL (ref 150–400)
RBC: 4.98 MIL/uL (ref 3.87–5.11)
RDW: 14.3 % (ref 11.5–15.5)
WBC: 8.2 10*3/uL (ref 4.0–10.5)
nRBC: 0 % (ref 0.0–0.2)

## 2019-11-15 LAB — BASIC METABOLIC PANEL
Anion gap: 7 (ref 5–15)
BUN: 18 mg/dL (ref 8–23)
CO2: 29 mmol/L (ref 22–32)
Calcium: 9.3 mg/dL (ref 8.9–10.3)
Chloride: 103 mmol/L (ref 98–111)
Creatinine, Ser: 0.9 mg/dL (ref 0.44–1.00)
GFR calc Af Amer: >60 mL/min (ref >60)
GFR calc non Af Amer: >60 mL/min (ref >60)
Glucose, Bld: 137 mg/dL — ABNORMAL HIGH (ref 70–99)
Potassium: 4 mmol/L (ref 3.5–5.1)
Sodium: 139 mmol/L (ref 135–145)

## 2019-11-15 LAB — RESPIRATORY PANEL BY RT PCR (FLU A&B, COVID)
Influenza A by PCR: NEGATIVE
Influenza B by PCR: NEGATIVE
SARS Coronavirus 2 by RT PCR: NEGATIVE

## 2019-11-15 LAB — TROPONIN I (HIGH SENSITIVITY)
Troponin I (High Sensitivity): 6 ng/L (ref <18)
Troponin I (High Sensitivity): 6 ng/L (ref <18)

## 2019-11-15 LAB — BRAIN NATRIURETIC PEPTIDE: B Natriuretic Peptide: 41.6 pg/mL (ref 0.0–100.0)

## 2019-11-15 LAB — D-DIMER, QUANTITATIVE: D-Dimer, Quant: 5.88 ug/mL-FEU — ABNORMAL HIGH (ref 0.00–0.50)

## 2019-11-15 MED ORDER — HEPARIN BOLUS VIA INFUSION
2000.0000 [IU] | Freq: Once | INTRAVENOUS | Status: AC
  Administered 2019-11-15: 21:00:00 2000 [IU] via INTRAVENOUS
  Filled 2019-11-15: qty 2000

## 2019-11-15 MED ORDER — ACETAMINOPHEN 325 MG PO TABS: 650.0000 mg | ORAL_TABLET | Freq: Four times a day (QID) | ORAL | Status: DC | PRN

## 2019-11-15 MED ORDER — IOHEXOL 350 MG/ML SOLN
100.0000 mL | Freq: Once | INTRAVENOUS | Status: AC | PRN
  Administered 2019-11-15: 20:00:00 100 mL via INTRAVENOUS

## 2019-11-15 MED ORDER — SODIUM CHLORIDE 0.9 % IV SOLN: INTRAVENOUS | Status: AC

## 2019-11-15 MED ORDER — HEPARIN (PORCINE) 25000 UT/250ML-% IV SOLN
1100.0000 [IU]/h | INTRAVENOUS | Status: DC
  Administered 2019-11-15: 21:00:00 1100 [IU]/h via INTRAVENOUS
  Filled 2019-11-15: qty 250

## 2019-11-15 MED ORDER — ACETAMINOPHEN 650 MG RE SUPP: 650.0000 mg | Freq: Four times a day (QID) | RECTAL | Status: DC | PRN

## 2019-11-15 NOTE — Progress Notes (Signed)
ANTICOAGULATION CONSULT NOTE - Initial Consult  Pharmacy Consult for Heparin Indication: pulmonary embolus  No Known Allergies  Patient Measurements: Height: 5\' 4"  (162.6 cm) Weight: 171 lb 15.3 oz (78 kg) IBW/kg (Calculated) : 54.7 HEPARIN DW (KG): 71.3   Vital Signs: Temp: 98.9 F (37.2 C) (02/04 1624) BP: 120/88 (02/04 1900) Pulse Rate: 37 (02/04 1900)  Labs: Recent Labs    11/15/19 1716 11/15/19 1900  HGB 15.1*  --   HCT 45.9  --   PLT 246  --   CREATININE 0.90  --   TROPONINIHS 6 6    Estimated Creatinine Clearance: 57.1 mL/min (by C-G formula based on SCr of 0.9 mg/dL).   Medical History: Past Medical History:  Diagnosis Date  . Allergic rhinitis   . Anemia   . Anxiety   . Arthritis   . Bipolar 1 disorder ([place])   . Depression   . Diverticulosis of colon (without mention of hemorrhage) 08/25/2011   [name]  . 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 Hyperlipidemia   . Hypertension   . Obesity   . Renal disorder   . Vertigo     Medications:  Infusions:   No anticoagulation PTA  Assessment: 74 yo F who presents from rehab facility with shortness of breath.  Chest CT + PE. Baseline CBC WNL.  No bleeding noted.  Pharmacy consulted to dose IV Heparin.   Goal of Therapy:  Heparin level 0.3-0.7 units/ml Monitor platelets by anticoagulation protocol: Yes   Plan:  Give 2000 units bolus x 1 Start heparin infusion at 1100 units/hr Check anti-Xa level in 8 hours and daily while on heparin Continue to monitor H&H and platelets  Netta Cedars, PharmD, BCPS 11/15/2019,8:30 PM

## 2019-11-15 NOTE — ED Triage Notes (Signed)
Pt BIB EMS from [facility]. Facility called out for respiratory distress. Upon arrival EMS did not note any distress. Pt not requiring oxygen at this time. Facility concerned about PE or DVT.   120/62 HR 60 RR 18 SpO2 97% RA

## 2019-11-15 NOTE — Progress Notes (Signed)
ED TO INPATIENT HANDOFF REPORT  ED Nurse Name and Phone #: Kileigh Ortmann [phone]  S Name/Age/Gender Judy [name] 74 y.o. female Room/Bed: WA23/WA23  Code Status   Code Status: DNR  Home/SNF/Other Rehab Patient oriented to: self, place, time, situation Is this baseline? Yes   Triage Complete: Triage complete  Chief Complaint Pulmonary embolism [facility]) [I26.99]  Triage Note Pt BIB EMS from [place]. Facility called out for respiratory distress. Upon arrival EMS did not note any distress. Pt not requiring oxygen at this time. Facility concerned about PE or DVT.   120/62 HR 60 RR 18 SpO2 97% RA     Allergies No Known Allergies  Level of Care/Admitting Diagnosis ED Disposition    ED Disposition Condition Comment   Admit  Hospital Area: [place] [[id number]]  Level of Care: Telemetry [5]  Admit to tele based on following criteria: Monitor for Ischemic changes  Covid Evaluation: Asymptomatic Screening Protocol (No Symptoms)  Diagnosis: Pulmonary embolism [facility]) [[id number]]  Admitting Physician: Jani Gravel [phone]  Attending Physician: Jani Gravel [phone]  Estimated length of stay: past midnight tomorrow  Certification:: I certify this patient will need inpatient services for at least 2 midnights       B Medical/Surgery History Past Medical History:  Diagnosis Date  . Allergic rhinitis   . Anemia   . Anxiety   . Arthritis   . Bipolar 1 disorder ([place])   . Depression   . Diverticulosis of colon (without mention of hemorrhage) 08/25/2011   [name]  . 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 Hyperlipidemia   . Hypertension   . Obesity   . Renal disorder   . Vertigo    Past Surgical History:  Procedure Laterality Date  . COLONOSCOPY  08/25/2011   Procedure: COLONOSCOPY;  Surgeon: Landry Dyke, MD;  Location: [facility];  Service: Endoscopy;  Laterality: N/A;  . DILATION AND CURETTAGE OF UTERUS  1960's     A IV  Location/Drains/Wounds Patient Lines/Drains/Airways Status   Active Line/Drains/Airways    Name:   Placement date:   Placement time:   Site:   Days:   Peripheral IV 11/15/19 Right Antecubital   11/15/19    1703    Antecubital   less than 1          Intake/Output Last 24 hours No intake or output data in the 24 hours ending 11/15/19 2201  Labs/Imaging Results for orders placed or performed during the hospital encounter of 11/15/19 (from the past 48 hour(s))  Respiratory Panel by RT PCR (Flu A&B, Covid) - Nasopharyngeal Swab     Status: None   Collection Time: 11/15/19  5:04 PM   Specimen: Nasopharyngeal Swab  Result Value Ref Range   SARS Coronavirus 2 by RT PCR NEGATIVE NEGATIVE    Comment: (NOTE) SARS-CoV-2 target nucleic acids are NOT DETECTED. The SARS-CoV-2 RNA is generally detectable in upper respiratoy specimens during the acute phase of infection. The lowest concentration of SARS-CoV-2 viral copies this assay can detect is 131 copies/mL. A negative result does not preclude SARS-Cov-2 infection and should not be used as the sole basis for treatment or other patient management decisions. A negative result may occur with  improper specimen collection/handling, submission of specimen other than nasopharyngeal swab, presence of viral mutation(s) within the areas targeted by this assay, and inadequate number of viral copies (<131 copies/mL). A negative result must be combined with clinical observations, patient history,  and epidemiological information. The expected result is Negative. Fact Sheet for Patients:  PinkCheek.be Fact Sheet for Healthcare Providers:  GravelBags.it This test is not yet ap proved or cleared by the [place] FDA and  has been authorized for detection and/or diagnosis of SARS-CoV-2 by FDA under an Emergency Use Authorization (EUA). This EUA will remain  in effect (meaning this test can be  used) for the duration of the COVID-19 declaration under Section 564(b)(1) of the Act, 21 U.S.C. section 360bbb-3(b)(1), unless the authorization is terminated or revoked sooner.    Influenza A by PCR NEGATIVE NEGATIVE   Influenza B by PCR NEGATIVE NEGATIVE    Comment: (NOTE) The Xpert Xpress SARS-CoV-2/FLU/RSV assay is intended as an aid in  the diagnosis of influenza from Nasopharyngeal swab specimens and  should not be used as a sole basis for treatment. Nasal washings and  aspirates are unacceptable for Xpert Xpress SARS-CoV-2/FLU/RSV  testing. Fact Sheet for Patients: PinkCheek.be Fact Sheet for Healthcare Providers: GravelBags.it This test is not yet approved or cleared by the [place] FDA and  has been authorized for detection and/or diagnosis of SARS-CoV-2 by  FDA under an Emergency Use Authorization (EUA). This EUA will remain  in effect (meaning this test can be used) for the duration of the  Covid-19 declaration under Section 564(b)(1) of the Act, 21  U.S.C. section 360bbb-3(b)(1), unless the authorization is  terminated or revoked. Performed at [facility], [place] [address], [place], [place] [address]   CBC with Differential     Status: Abnormal   Collection Time: 11/15/19  5:16 PM  Result Value Ref Range   WBC 8.2 4.0 - 10.5 K/uL   RBC 4.98 3.87 - 5.11 MIL/uL   Hemoglobin 15.1 (H) 12.0 - 15.0 g/dL   HCT 45.9 36.0 - 46.0 %   MCV 92.2 80.0 - 100.0 fL   MCH 30.3 26.0 - 34.0 pg   MCHC 32.9 30.0 - 36.0 g/dL   RDW 14.3 11.5 - 15.5 %   Platelets 246 150 - 400 K/uL   nRBC 0.0 0.0 - 0.2 %   Neutrophils Relative % 86 %   Neutro Abs 7.1 1.7 - 7.7 K/uL   Lymphocytes Relative 12 %   Lymphs Abs 0.9 0.7 - 4.0 K/uL   Monocytes Relative 2 %   Monocytes Absolute 0.2 0.1 - 1.0 K/uL   Eosinophils Relative 0 %   Eosinophils Absolute 0.0 0.0 - 0.5 K/uL   Basophils Relative 0 %   Basophils Absolute  0.0 0.0 - 0.1 K/uL   Immature Granulocytes 0 %   Abs Immature Granulocytes 0.03 0.00 - 0.07 K/uL    Comment: Performed at [facility], [place] [address], [place], [place] [address]  Basic metabolic panel     Status: Abnormal   Collection Time: 11/15/19  5:16 PM  Result Value Ref Range   Sodium 139 135 - 145 mmol/L   Potassium 4.0 3.5 - 5.1 mmol/L   Chloride 103 98 - 111 mmol/L   CO2 29 22 - 32 mmol/L   Glucose, Bld 137 (H) 70 - 99 mg/dL   BUN 18 8 - 23 mg/dL   Creatinine, Ser 0.90 0.44 - 1.00 mg/dL   Calcium 9.3 8.9 - 10.3 mg/dL   GFR calc non Af Amer >60 >60 mL/min   GFR calc Af Amer >60 >60 mL/min   Anion gap 7 5 - 15    Comment: Performed at [facility], [address] [place], [place] [address]  D-dimer, quantitative     Status: Abnormal   Collection Time: 11/15/19  5:16 PM  Result Value Ref Range   D-Dimer, Quant 5.88 (H) 0.00 - 0.50 ug/mL-FEU    Comment: (NOTE) At the manufacturer cut-off of 0.50 ug/mL FEU, this assay has been documented to exclude PE with a sensitivity and negative predictive value of 97 to 99%.  At this time, this assay has not been approved by the FDA to exclude DVT/VTE. Results should be correlated with clinical presentation. Performed at [facility], [place] [address], [place], [place] [address]   Troponin I (High Sensitivity)     Status: None   Collection Time: 11/15/19  5:16 PM  Result Value Ref Range   Troponin I (High Sensitivity) 6 <18 ng/L    Comment: (NOTE) Elevated high sensitivity troponin I (hsTnI) values and significant  changes across serial measurements may suggest ACS but many other  chronic and acute conditions are known to elevate hsTnI results.  Refer to the "Links" section for chest pain algorithms and additional  guidance. Performed at [facility], [place] [address], [place], [place] [address]   Brain natriuretic peptide     Status: None   Collection  Time: 11/15/19  5:16 PM  Result Value Ref Range   B Natriuretic Peptide 41.6 0.0 - 100.0 pg/mL    Comment: Performed at [facility], [place] [address] [place], [place] [address]  Troponin I (High Sensitivity)     Status: None   Collection Time: 11/15/19  7:00 PM  Result Value Ref Range   Troponin I (High Sensitivity) 6 <18 ng/L    Comment: (NOTE) Elevated high sensitivity troponin I (hsTnI) values and significant  changes across serial measurements may suggest ACS but many other  chronic and acute conditions are known to elevate hsTnI results.  Refer to the "Links" section for chest pain algorithms and additional  guidance. Performed at [facility], [place] [address] [place], [place] [address]    CT Angio Chest PE W and/or Wo Contrast  Result Date: 11/15/2019 CLINICAL DATA:  Respiratory distress. Evaluate for pulmonary embolus. EXAM: CT ANGIOGRAPHY CHEST WITH CONTRAST TECHNIQUE: Multidetector CT imaging of the chest was performed using the standard protocol during bolus administration of intravenous contrast. Multiplanar CT image reconstructions and MIPs were obtained to evaluate the vascular anatomy. CONTRAST:  113mL OMNIPAQUE IOHEXOL 350 MG/ML SOLN COMPARISON:  Chest CT 08/04/2018 FINDINGS: Cardiovascular: Heart is mildly enlarged. Trace fluid superior pericardial recess. Thoracic aortic vascular calcifications. Aorta and main pulmonary artery normal in caliber. Small filling defects are demonstrated within the segmental right lower lobe pulmonary arteries (image 123; series 5). Additionally there is a small filling defect within the left lower lobe segmental pulmonary artery (image 162; series 5). Additionally small emboli demonstrated within the right upper lobe subsegmental pulmonary arteries (image 80; series 5). No CT evidence to suggest right heart strain.23 Mediastinum/Nodes: No enlarged axillary, mediastinal or hilar lymphadenopathy. Normal appearance  of the esophagus. Lungs/Pleura: Central airways are patent. No large area pulmonary consolidation. No pleural effusion pneumothorax. Unchanged 7 mm ground-glass nodule within the right upper lobe (image 48; series 6). No pleural effusion or pneumothorax. Upper Abdomen: Unremarkable. Musculoskeletal: No aggressive or acute appearing osseous lesions. Unchanged chronic appearing T12 compression deformity. Review of the MIP images confirms the above findings. IMPRESSION: 1. Bilateral segmental and subsegmental pulmonary emboli. No CT evidence for right heart strain. 2. Unchanged 7  mm ground-glass nodule within the right upper lobe. Recommend additional follow-up chest CT in 2 years. 3. Aortic Atherosclerosis (ICD10-I70.0). 4. Critical Value/emergent results were called by telephone at the time of interpretation on 11/15/2019 at 8:24 pm to provider [facility] , who verbally acknowledged these results. Electronically Signed   By: Lovey Newcomer M.D.   On: 11/15/2019 20:27   DG Chest Portable 1 View  Result Date: 11/15/2019 CLINICAL DATA:  Dyspnea. EXAM: PORTABLE CHEST 1 VIEW COMPARISON:  November 06, 2019 FINDINGS: The heart size and mediastinal contours are within normal limits. Both lungs are clear. Tortuosity of the descending thoracic aorta is seen. The visualized skeletal structures are unremarkable. IMPRESSION: Stable exam without evidence of acute or active cardiopulmonary disease. Electronically Signed   By: Virgina Norfolk M.D.   On: 11/15/2019 17:07   VAS [place] LOWER EXTREMITY VENOUS (DVT) (ONLY [facility] & [facility] 7a-7p)  Result Date: 11/15/2019  Lower Venous DVTStudy Indications: Edema, and Swelling.  Comparison Study: 06/28/17 previous Performing Technologist: Abram Sander RVS  Examination Guidelines: A complete evaluation includes B-mode imaging, spectral Doppler, color Doppler, and power Doppler as needed of all accessible portions of each vessel. Bilateral testing is considered an integral part of a complete examination.  Limited examinations for reoccurring indications may be performed as noted. The reflux portion of the exam is performed with the patient in reverse Trendelenburg.  +---------+---------------+---------+-----------+----------+--------------+ RIGHT    CompressibilityPhasicitySpontaneityPropertiesThrombus Aging +---------+---------------+---------+-----------+----------+--------------+ CFV      Full           Yes      Yes                                 +---------+---------------+---------+-----------+----------+--------------+ SFJ      Full                                                        +---------+---------------+---------+-----------+----------+--------------+ FV Prox  Full                                                        +---------+---------------+---------+-----------+----------+--------------+ FV Mid   Full                                                        +---------+---------------+---------+-----------+----------+--------------+ FV DistalFull                                                        +---------+---------------+---------+-----------+----------+--------------+ PFV      Full                                                        +---------+---------------+---------+-----------+----------+--------------+  POP      Full           Yes      Yes                                 +---------+---------------+---------+-----------+----------+--------------+ PTV      Full                                                        +---------+---------------+---------+-----------+----------+--------------+ PERO     Full                                                        +---------+---------------+---------+-----------+----------+--------------+   +----+---------------+---------+-----------+----------+--------------+ LEFTCompressibilityPhasicitySpontaneityPropertiesThrombus Aging  +----+---------------+---------+-----------+----------+--------------+ CFV Full           Yes      Yes                                 +----+---------------+---------+-----------+----------+--------------+     Summary: RIGHT: - There is no evidence of deep vein thrombosis in the lower extremity.  - No cystic structure found in the popliteal fossa.  LEFT: - No evidence of common femoral vein obstruction.  *See table(s) above for measurements and observations.    Preliminary     Pending Labs Unresulted Labs (From admission, onward)    Start     Ordered   11/16/19 0500  CBC  Daily,   R     11/15/19 2049   11/16/19 0500  Comprehensive metabolic panel  Tomorrow morning,   R    Question:  Release to patient  Answer:  Immediate   11/15/19 2055   11/16/19 0500  Heparin level (unfractionated)  Once-Timed,   STAT    Comments: Please draw all morning labs at this time.    11/15/19 2111          Vitals/Pain Today's Vitals   11/15/19 2030 11/15/19 2052 11/15/19 2100 11/15/19 2130  BP: 122/68  134/73 (!) 144/72  Pulse: (!) 58  60 (!) 57  Resp: (!) 25 (!) 40 (!) 31 (!) 25  Temp:      SpO2: 98%  96% 95%  Weight:      Height:        Isolation Precautions Airborne and Contact precautions  Medications Medications  heparin bolus via infusion 2,000 Units (2,000 Units Intravenous Bolus from Bag 11/15/19 2054)    Followed by  heparin ADULT infusion 100 units/mL (25000 units/276mL sodium chloride 0.45%) (1,100 Units/hr Intravenous New Bag/Given 11/15/19 2056)  0.9 %  sodium chloride infusion ( Intravenous New Bag/Given 11/15/19 2100)  acetaminophen (TYLENOL) tablet 650 mg (has no administration in time range)    Or  acetaminophen (TYLENOL) suppository 650 mg (has no administration in time range)  iohexol (OMNIPAQUE) 350 MG/ML injection 100 mL (100 mLs Intravenous Contrast Given 11/15/19 1956)    Mobility non-ambulatory Moderate fall risk   Focused Assessments    R Recommendations: See  Admitting Provider Note  Report given to: Cooper Render [phone]  Additional Notes:

## 2019-11-15 NOTE — ED Provider Notes (Signed)
[facility] DEPT Provider Note   CSN: [id number] Arrival date & time: 11/15/19  1608     History Chief Complaint  Patient presents with  . Shortness of Breath    Judy [name] is a 74 y.o. female.  HPI   Patient presented to the ED for evaluation of shortness of breath.  Patient is a resident of [facility].  Staff at the facility noted the patient was in respiratory distress.  They were concerned about the possibility of PE or DVT so she was sent to the ED for further evaluation.  Patient does complain of feeling short of breath earlier.  It started this morning.  She has not had any trouble with chest pain fever or cough but has had some discomfort in her right leg.  Patient denies any history of chronic breathing problems.  She is feeling better now than she was earlier.  Past Medical History:  Diagnosis Date  . Allergic rhinitis   . Anemia   . Anxiety   . Arthritis   . Bipolar 1 disorder ([place])   . Depression   . Diverticulosis of colon (without mention of hemorrhage) 08/25/2011   [name]  . 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 Hyperlipidemia   . Hypertension   . Obesity   . Renal disorder   . Vertigo     Patient Active Problem List   Diagnosis Date Noted  . Spinal stenosis 11/07/2019  . Weakness of both lower extremities 11/06/2019  . Generalized anxiety disorder 07/24/2018  . Solitary pulmonary nodule on lung CT 06/13/2017  . Spinal stenosis at L4-L5 level   . Dysphagia 05/25/2017  . Tardive dyskinesia 05/25/2017  . Dyspnea on exertion 05/25/2017  . Fall 05/25/2017  . Bilateral leg pain   . Chronic pain of right knee 03/25/2017  . High risk medication use 03/25/2017  . Family hx of colon cancer 03/25/2017  . Occupational exposure in workplace 03/25/2017  . Depressed bipolar I disorder in partial remission ([place]) 03/25/2017  . Routine general medical examination at a health care facility 06/04/2014  .  Hyperlipidemia with target LDL less than 130 03/20/2014  . Essential hypertension, benign 03/20/2014  . Unspecified constipation 03/20/2014  . Light headedness 03/20/2014  . Acute on chronic kidney disease, stage 3 03/20/2014  . Hypokalemia 03/20/2014  . Encounter for therapeutic drug monitoring 03/20/2014  . Other dysphagia 12/26/2013  . GERD (gastroesophageal reflux disease) 12/21/2013  . Constipation 12/21/2013  . Syncope 12/10/2013  . Leukocytosis 12/10/2013  . URI (upper respiratory infection) 12/10/2013  . Bipolar 1 disorder ([place])   . Obesity (BMI 30-39.9)   . Hearing loss in left ear   . Vertigo   . HYPERCHOLESTEROLEMIA 10/08/2010  . DEPRESSION 10/08/2010  . HYPERTENSION 10/08/2010  . ALLERGIC RHINITIS 10/08/2010    Past Surgical History:  Procedure Laterality Date  . COLONOSCOPY  08/25/2011   Procedure: COLONOSCOPY;  Surgeon: Landry Dyke, MD;  Location: [facility];  Service: Endoscopy;  Laterality: N/A;  . DILATION AND CURETTAGE OF UTERUS  1960's     OB History   No obstetric history on file.     Family History  Problem Relation Age of Onset  . Prostate cancer Brother   . Hypertension Brother   . Kidney disease Brother   . Colon cancer Brother   . Hypertension Mother   . Kidney disease Mother   . Stomach cancer Father   .  Hyperlipidemia Sister   . Hypothyroidism Sister   . Stroke Brother   . Prostate cancer Brother     Social History   Tobacco Use  . Smoking status: Never Smoker  . Smokeless tobacco: Never Used  Substance Use Topics  . Alcohol use: No  . Drug use: No    Home Medications Prior to Admission medications   Medication Sig Start Date End Date Taking? Authorizing Provider  acetaminophen (TYLENOL) 325 MG tablet Take 2 tablets (650 mg total) by mouth every 6 (six) hours as needed for mild pain (or Fever >/= 101). 11/08/19  Yes Nita Sells, MD  amLODipine (NORVASC) 5 MG tablet TAKE 1 TABLET BY MOUTH EVERY DAY Patient  taking differently: Take 5 mg by mouth daily.  10/15/19  Yes [name], FNP  atorvastatin (LIPITOR) 10 MG tablet Take 1 tablet (10 mg total) by mouth daily. 11/01/19 10/31/20 Yes [name], FNP  buPROPion (WELLBUTRIN XL) 150 MG 24 hr tablet Take 1 tablet (150 mg total) by mouth daily. 11/08/19  Yes Nita Sells, MD  docusate sodium (COLACE) 100 MG capsule Take 100 mg by mouth 2 (two) times daily.   Yes [provider]  gabapentin (NEURONTIN) 300 MG capsule Take 1 capsule (300 mg total) by mouth 3 (three) times daily. 08/09/19  Yes Hurst, Dorothea Glassman, PA-C  Lurasidone HCl (LATUDA) 120 MG TABS Take 1 tablet (120 mg total) by mouth at bedtime. 11/08/19  Yes Nita Sells, MD  meclizine (ANTIVERT) 25 MG tablet Take 1 tablet (25 mg total) by mouth 3 (three) times daily as needed for dizziness. 07/08/18  Yes Jola Schmidt, MD  Multiple Vitamin (DAILY VITE PO) Take 1 tablet by mouth daily.   Yes [provider]  traMADol (ULTRAM) 50 MG tablet Take 1 tablet (50 mg total) by mouth every 6 (six) hours as needed for moderate pain. 11/08/19  Yes Nita Sells, MD  Valbenazine Tosylate [facility]) 40 MG CAPS Take 40 mg by mouth daily. 11/08/19  Yes Nita Sells, MD  Vitamin D, Ergocalciferol, (DRISDOL) 1.25 MG (50000 UNIT) CAPS capsule TAKE 1 CAPSULE (50,000 UNITS TOTAL) BY MOUTH EVERY 7 (SEVEN) DAYS. Patient taking differently: Take 50,000 Units by mouth every 7 (seven) days. sunday 10/27/19  Yes Donnal Moat T, PA-C  predniSONE (DELTASONE) 50 MG tablet Take one daily until all gone Patient not taking: Reported on 11/15/2019 11/09/19   Nita Sells, MD    Allergies    Patient has no known allergies.  Review of Systems   Review of Systems  All other systems reviewed and are negative.   Physical Exam Updated Vital Signs BP 120/88   Pulse (!) 37   Temp 98.9 F (37.2 C)   Resp (!) 22   Ht 1.626 m (5\' 4" )   Wt 78 kg   SpO2 98%   BMI 29.52 kg/m    Physical Exam Vitals and nursing note reviewed.  Constitutional:      General: She is not in acute distress.    Appearance: She is well-developed.  HENT:     Head: Normocephalic and atraumatic.     Right Ear: External ear normal.     Left Ear: External ear normal.  Eyes:     General: No scleral icterus.       Right eye: No discharge.        Left eye: No discharge.     Conjunctiva/sclera: Conjunctivae normal.  Neck:     Trachea: No tracheal deviation.  Cardiovascular:  Rate and Rhythm: Normal rate and regular rhythm.  Pulmonary:     Effort: Pulmonary effort is normal. No respiratory distress.     Breath sounds: Normal breath sounds. No stridor. No decreased breath sounds, wheezing or rales.     Comments: Able to speak in full sentences Abdominal:     General: Bowel sounds are normal. There is no distension.     Palpations: Abdomen is soft.     Tenderness: There is no abdominal tenderness. There is no guarding or rebound.  Musculoskeletal:     Cervical back: Neck supple.     Right lower leg: Tenderness present. No edema.     Left lower leg: No edema.     Comments: Mild tenderness palpation right calf and thigh  Skin:    General: Skin is warm and dry.     Findings: No rash.  Neurological:     Mental Status: She is alert.     Cranial Nerves: No cranial nerve deficit (no facial droop, extraocular movements intact, no slurred speech).     Sensory: No sensory deficit.     Motor: No abnormal muscle tone or seizure activity.     Coordination: Coordination normal.     ED Results / Procedures / Treatments   Labs (all labs ordered are listed, but only abnormal results are displayed) Labs Reviewed  CBC WITH DIFFERENTIAL/PLATELET - Abnormal; Notable for the following components:      Result Value   Hemoglobin 15.1 (*)    All other components within normal limits  BASIC METABOLIC PANEL - Abnormal; Notable for the following components:   Glucose, Bld 137 (*)    All other  components within normal limits  D-DIMER, QUANTITATIVE (NOT AT [facility]) - Abnormal; Notable for the following components:   D-Dimer, Quant 5.88 (*)    All other components within normal limits  RESPIRATORY PANEL BY RT PCR (FLU A&B, COVID)  BRAIN NATRIURETIC PEPTIDE  TROPONIN I (HIGH SENSITIVITY)  TROPONIN I (HIGH SENSITIVITY)    EKG None  Radiology CT Angio Chest PE W and/or Wo Contrast  Result Date: 11/15/2019 CLINICAL DATA:  Respiratory distress. Evaluate for pulmonary embolus. EXAM: CT ANGIOGRAPHY CHEST WITH CONTRAST TECHNIQUE: Multidetector CT imaging of the chest was performed using the standard protocol during bolus administration of intravenous contrast. Multiplanar CT image reconstructions and MIPs were obtained to evaluate the vascular anatomy. CONTRAST:  117mL OMNIPAQUE IOHEXOL 350 MG/ML SOLN COMPARISON:  Chest CT 08/04/2018 FINDINGS: Cardiovascular: Heart is mildly enlarged. Trace fluid superior pericardial recess. Thoracic aortic vascular calcifications. Aorta and main pulmonary artery normal in caliber. Small filling defects are demonstrated within the segmental right lower lobe pulmonary arteries (image 123; series 5). Additionally there is a small filling defect within the left lower lobe segmental pulmonary artery (image 162; series 5). Additionally small emboli demonstrated within the right upper lobe subsegmental pulmonary arteries (image 80; series 5). No CT evidence to suggest right heart strain.23 Mediastinum/Nodes: No enlarged axillary, mediastinal or hilar lymphadenopathy. Normal appearance of the esophagus. Lungs/Pleura: Central airways are patent. No large area pulmonary consolidation. No pleural effusion pneumothorax. Unchanged 7 mm ground-glass nodule within the right upper lobe (image 48; series 6). No pleural effusion or pneumothorax. Upper Abdomen: Unremarkable. Musculoskeletal: No aggressive or acute appearing osseous lesions. Unchanged chronic appearing T12 compression  deformity. Review of the MIP images confirms the above findings. IMPRESSION: 1. Bilateral segmental and subsegmental pulmonary emboli. No CT evidence for right heart strain. 2. Unchanged 7 mm ground-glass nodule within the  right upper lobe. Recommend additional follow-up chest CT in 2 years. 3. Aortic Atherosclerosis (ICD10-I70.0). 4. Critical Value/emergent results were called by telephone at the time of interpretation on 11/15/2019 at 8:24 pm to provider [facility] , who verbally acknowledged these results. Electronically Signed   By: Lovey Newcomer M.D.   On: 11/15/2019 20:27   DG Chest Portable 1 View  Result Date: 11/15/2019 CLINICAL DATA:  Dyspnea. EXAM: PORTABLE CHEST 1 VIEW COMPARISON:  November 06, 2019 FINDINGS: The heart size and mediastinal contours are within normal limits. Both lungs are clear. Tortuosity of the descending thoracic aorta is seen. The visualized skeletal structures are unremarkable. IMPRESSION: Stable exam without evidence of acute or active cardiopulmonary disease. Electronically Signed   By: Virgina Norfolk M.D.   On: 11/15/2019 17:07   VAS [place] LOWER EXTREMITY VENOUS (DVT) (ONLY [facility] & [facility] 7a-7p)  Result Date: 11/15/2019  Lower Venous DVTStudy Indications: Edema, and Swelling.  Comparison Study: 06/28/17 previous Performing Technologist: Abram Sander RVS  Examination Guidelines: A complete evaluation includes B-mode imaging, spectral Doppler, color Doppler, and power Doppler as needed of all accessible portions of each vessel. Bilateral testing is considered an integral part of a complete examination. Limited examinations for reoccurring indications may be performed as noted. The reflux portion of the exam is performed with the patient in reverse Trendelenburg.  +---------+---------------+---------+-----------+----------+--------------+ RIGHT    CompressibilityPhasicitySpontaneityPropertiesThrombus Aging +---------+---------------+---------+-----------+----------+--------------+  CFV      Full           Yes      Yes                                 +---------+---------------+---------+-----------+----------+--------------+ SFJ      Full                                                        +---------+---------------+---------+-----------+----------+--------------+ FV Prox  Full                                                        +---------+---------------+---------+-----------+----------+--------------+ FV Mid   Full                                                        +---------+---------------+---------+-----------+----------+--------------+ FV DistalFull                                                        +---------+---------------+---------+-----------+----------+--------------+ PFV      Full                                                        +---------+---------------+---------+-----------+----------+--------------+  POP      Full           Yes      Yes                                 +---------+---------------+---------+-----------+----------+--------------+ PTV      Full                                                        +---------+---------------+---------+-----------+----------+--------------+ PERO     Full                                                        +---------+---------------+---------+-----------+----------+--------------+   +----+---------------+---------+-----------+----------+--------------+ LEFTCompressibilityPhasicitySpontaneityPropertiesThrombus Aging +----+---------------+---------+-----------+----------+--------------+ CFV Full           Yes      Yes                                 +----+---------------+---------+-----------+----------+--------------+     Summary: RIGHT: - There is no evidence of deep vein thrombosis in the lower extremity.  - No cystic structure found in the popliteal fossa.  LEFT: - No evidence of common femoral vein obstruction.  *See table(s) above  for measurements and observations.    Preliminary     Procedures .Critical Care Performed by: Dorie Rank, MD Authorized by: Dorie Rank, MD   Critical care provider statement:    Critical care time (minutes):  30   Critical care was time spent personally by me on the following activities:  Discussions with consultants, evaluation of patient's response to treatment, examination of patient, ordering and performing treatments and interventions, ordering and review of laboratory studies, ordering and review of radiographic studies, pulse oximetry, re-evaluation of patient's condition, obtaining history from patient or surrogate and review of old charts   (including critical care time)  Medications Ordered in ED Medications  iohexol (OMNIPAQUE) 350 MG/ML injection 100 mL (100 mLs Intravenous Contrast Given 11/15/19 1956)    ED Course  I have reviewed the triage vital signs and the nursing notes.  Pertinent labs & imaging results that were available during my care of the patient were reviewed by me and considered in my medical decision making (see chart for details).  Clinical Course as of Nov 14 2044  Thu Nov 15, 2019  1810 D-dimer is elevated.  Plan on CT angiogram but will wait on her metabolic panel   [JK]  5631 Preliminary vascular study result reviewed.  No DVT   [JK]  1811 cxr without acute finding.   [JK]  2045 Heart rate is 56 on the bedside monitor not 37   [JK]    Clinical Course User Index [JK] Dorie Rank, MD   MDM Rules/Calculators/A&P                      Patient presented to the emergency room for evaluation of shortness of breath. Patient's laboratory tests were notable for an elevated D-dimer. No DVT noted on ultrasound but the CT scan does show pulmonary embolism.  Heparin was ordered. Plan on admission to the hospital for further treatment.   Final Clinical Impression(s) / ED Diagnoses Final diagnoses:  Acute pulmonary embolism without acute cor pulmonale,  unspecified pulmonary embolism type [facility])     Dorie Rank, MD 11/15/19 2046

## 2019-11-15 NOTE — H&P (Addendum)
TRH H&P    Patient Demographics:    Judy [name], is a 74 y.o. female  MRN: [id number]  DOB - 07/03/1946  Admit Date - 11/15/2019  Referring MD/NP/PA:  Marye Round  Outpatient Primary MD for the patient is [name], [place]  Patient coming from:  [facility]  Chief complaint- dyspnea   HPI:    Judy [name]  is a 74 y.o. female,  w  Bipolar do, hypertension, hyperlipidemia, vertigo, obeisity, L4-5 spinal stenosis, no family hx of PE, w recently presents from rehab due to dyspnea, chest pain starting this am. Pt sent for r/o PE  In ED T 98.9, P 64  R 15, Bp 127/83  Pox 98% on RA Wt 78 kg  CTA chest IMPRESSION: 1. Bilateral segmental and subsegmental pulmonary emboli. No CT evidence for right heart strain. 2. Unchanged 7 mm ground-glass nodule within the right upper lobe. Recommend additional follow-up chest CT in 2 years.  3. Aortic Atherosclerosis (ICD10-I70.0).  Lower extremity ultrasound negative for DVT preliminary read  Wbc 8.2, Hgb 15.1, Plt 246 Na 139, K 4.0, Bun 18, Creatinine 0.90 D dimer 5.88  Trop 6  BNP 41.6  Pt will be admitted for Acute PE   Review of systems:    In addition to the HPI above,  No Fever-chills, No Headache, No changes with Vision or hearing, No problems swallowing food or Liquids,   No Abdominal pain, No Nausea or Vomiting, bowel movements are regular, No Blood in stool or Urine, No dysuria, No new skin rashes or bruises, No new joints pains-aches,  No new weakness, tingling, numbness in any extremity, No recent weight gain or loss, No polyuria, polydypsia or polyphagia, No significant Mental Stressors.  All other systems reviewed and are negative.    Past History of the following :    Past Medical History:  Diagnosis Date  . Allergic rhinitis   . Anemia   . Anxiety   . Arthritis   . Bipolar 1 disorder ([place])   . Depression   .  Diverticulosis of colon (without mention of hemorrhage) 08/25/2011   [name]  . 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 Hyperlipidemia   . Hypertension   . Obesity   . Renal disorder   . Vertigo       Past Surgical History:  Procedure Laterality Date  . COLONOSCOPY  08/25/2011   Procedure: COLONOSCOPY;  Surgeon: Landry Dyke, MD;  Location: [facility];  Service: Endoscopy;  Laterality: N/A;  . DILATION AND CURETTAGE OF UTERUS  1960's      Social History:      Social History   Tobacco Use  . Smoking status: Never Smoker  . Smokeless tobacco: Never Used  Substance Use Topics  . Alcohol use: No       Family History :     Family History  Problem Relation Age of Onset  . Prostate cancer Brother   . Hypertension Brother   . Kidney disease Brother   . Colon  cancer Brother   . Hypertension Mother   . Kidney disease Mother   . Stomach cancer Father   . Hyperlipidemia Sister   . Hypothyroidism Sister   . Stroke Brother   . Prostate cancer Brother        Home Medications:   Prior to Admission medications   Medication Sig Start Date End Date Taking? Authorizing Provider  acetaminophen (TYLENOL) 325 MG tablet Take 2 tablets (650 mg total) by mouth every 6 (six) hours as needed for mild pain (or Fever >/= 101). 11/08/19  Yes Nita Sells, MD  amLODipine (NORVASC) 5 MG tablet TAKE 1 TABLET BY MOUTH EVERY DAY Patient taking differently: Take 5 mg by mouth daily.  10/15/19  Yes [name], FNP  atorvastatin (LIPITOR) 10 MG tablet Take 1 tablet (10 mg total) by mouth daily. 11/01/19 10/31/20 Yes [name], FNP  buPROPion (WELLBUTRIN XL) 150 MG 24 hr tablet Take 1 tablet (150 mg total) by mouth daily. 11/08/19  Yes Nita Sells, MD  docusate sodium (COLACE) 100 MG capsule Take 100 mg by mouth 2 (two) times daily.   Yes [provider]  gabapentin (NEURONTIN) 300 MG capsule Take 1 capsule (300 mg total) by mouth 3 (three) times daily.  08/09/19  Yes Hurst, Dorothea Glassman, PA-C  Lurasidone HCl (LATUDA) 120 MG TABS Take 1 tablet (120 mg total) by mouth at bedtime. 11/08/19  Yes Nita Sells, MD  meclizine (ANTIVERT) 25 MG tablet Take 1 tablet (25 mg total) by mouth 3 (three) times daily as needed for dizziness. 07/08/18  Yes Jola Schmidt, MD  Multiple Vitamin (DAILY VITE PO) Take 1 tablet by mouth daily.   Yes [provider]  traMADol (ULTRAM) 50 MG tablet Take 1 tablet (50 mg total) by mouth every 6 (six) hours as needed for moderate pain. 11/08/19  Yes Nita Sells, MD  Valbenazine Tosylate [facility]) 40 MG CAPS Take 40 mg by mouth daily. 11/08/19  Yes Nita Sells, MD  Vitamin D, Ergocalciferol, (DRISDOL) 1.25 MG (50000 UNIT) CAPS capsule TAKE 1 CAPSULE (50,000 UNITS TOTAL) BY MOUTH EVERY 7 (SEVEN) DAYS. Patient taking differently: Take 50,000 Units by mouth every 7 (seven) days. sunday 10/27/19  Yes Hurst, Teresa T, PA-C  predniSONE (DELTASONE) 50 MG tablet Take one daily until all gone Patient not taking: Reported on 11/15/2019 11/09/19   Nita Sells, MD     Allergies:    No Known Allergies   Physical Exam:   Vitals  Blood pressure 120/88, pulse (!) 37, temperature 98.9 F (37.2 C), resp. rate (!) 40, height 5\' 4"  (1.626 m), weight 78 kg, SpO2 98 %.  1.  General: axoxo3  2. Psychiatric: euthymic  3. Neurologic: nonfocal  4. HEENMT:  Anicteric, pupils 1.53mm symmetric, direct, consensual intact + arcus senilis  5. Respiratory : CTAB  6. Cardiovascular : rrr s1, s2, no m/g/r  7. Gastrointestinal:  Abd: soft, nt, nd, +bs   8. Skin:  Ext: no c/c/e, no rash  9.Musculoskeletal:  Good ROM    Data Review:    CBC Recent Labs  Lab 11/15/19 1716  WBC 8.2  HGB 15.1*  HCT 45.9  PLT 246  MCV 92.2  MCH 30.3  MCHC 32.9  RDW 14.3  LYMPHSABS 0.9  MONOABS 0.2  EOSABS 0.0  BASOSABS 0.0    ------------------------------------------------------------------------------------------------------------------  Results for orders placed or performed during the hospital encounter of 11/15/19 (from the past 48 hour(s))  Respiratory Panel by RT PCR (Flu A&B, Covid) - Nasopharyngeal Swab  Status: None   Collection Time: 11/15/19  5:04 PM   Specimen: Nasopharyngeal Swab  Result Value Ref Range   SARS Coronavirus 2 by RT PCR NEGATIVE NEGATIVE    Comment: (NOTE) SARS-CoV-2 target nucleic acids are NOT DETECTED. The SARS-CoV-2 RNA is generally detectable in upper respiratoy specimens during the acute phase of infection. The lowest concentration of SARS-CoV-2 viral copies this assay can detect is 131 copies/mL. A negative result does not preclude SARS-Cov-2 infection and should not be used as the sole basis for treatment or other patient management decisions. A negative result may occur with  improper specimen collection/handling, submission of specimen other than nasopharyngeal swab, presence of viral mutation(s) within the areas targeted by this assay, and inadequate number of viral copies (<131 copies/mL). A negative result must be combined with clinical observations, patient history, and epidemiological information. The expected result is Negative. Fact Sheet for Patients:  PinkCheek.be Fact Sheet for Healthcare Providers:  GravelBags.it This test is not yet ap proved or cleared by the [place] FDA and  has been authorized for detection and/or diagnosis of SARS-CoV-2 by FDA under an Emergency Use Authorization (EUA). This EUA will remain  in effect (meaning this test can be used) for the duration of the COVID-19 declaration under Section 564(b)(1) of the Act, 21 U.S.C. section 360bbb-3(b)(1), unless the authorization is terminated or revoked sooner.    Influenza A by PCR NEGATIVE NEGATIVE   Influenza B by PCR  NEGATIVE NEGATIVE    Comment: (NOTE) The Xpert Xpress SARS-CoV-2/FLU/RSV assay is intended as an aid in  the diagnosis of influenza from Nasopharyngeal swab specimens and  should not be used as a sole basis for treatment. Nasal washings and  aspirates are unacceptable for Xpert Xpress SARS-CoV-2/FLU/RSV  testing. Fact Sheet for Patients: PinkCheek.be Fact Sheet for Healthcare Providers: GravelBags.it This test is not yet approved or cleared by the [place] FDA and  has been authorized for detection and/or diagnosis of SARS-CoV-2 by  FDA under an Emergency Use Authorization (EUA). This EUA will remain  in effect (meaning this test can be used) for the duration of the  Covid-19 declaration under Section 564(b)(1) of the Act, 21  U.S.C. section 360bbb-3(b)(1), unless the authorization is  terminated or revoked. Performed at [facility], [place] [address], [place], [place] [address]   CBC with Differential     Status: Abnormal   Collection Time: 11/15/19  5:16 PM  Result Value Ref Range   WBC 8.2 4.0 - 10.5 K/uL   RBC 4.98 3.87 - 5.11 MIL/uL   Hemoglobin 15.1 (H) 12.0 - 15.0 g/dL   HCT 45.9 36.0 - 46.0 %   MCV 92.2 80.0 - 100.0 fL   MCH 30.3 26.0 - 34.0 pg   MCHC 32.9 30.0 - 36.0 g/dL   RDW 14.3 11.5 - 15.5 %   Platelets 246 150 - 400 K/uL   nRBC 0.0 0.0 - 0.2 %   Neutrophils Relative % 86 %   Neutro Abs 7.1 1.7 - 7.7 K/uL   Lymphocytes Relative 12 %   Lymphs Abs 0.9 0.7 - 4.0 K/uL   Monocytes Relative 2 %   Monocytes Absolute 0.2 0.1 - 1.0 K/uL   Eosinophils Relative 0 %   Eosinophils Absolute 0.0 0.0 - 0.5 K/uL   Basophils Relative 0 %   Basophils Absolute 0.0 0.0 - 0.1 K/uL   Immature Granulocytes 0 %   Abs Immature Granulocytes 0.03 0.00 - 0.07 K/uL  Comment: Performed at [facility], [place] [address], [place], [place] [address]  Basic metabolic panel     Status: Abnormal     Collection Time: 11/15/19  5:16 PM  Result Value Ref Range   Sodium 139 135 - 145 mmol/L   Potassium 4.0 3.5 - 5.1 mmol/L   Chloride 103 98 - 111 mmol/L   CO2 29 22 - 32 mmol/L   Glucose, Bld 137 (H) 70 - 99 mg/dL   BUN 18 8 - 23 mg/dL   Creatinine, Ser 0.90 0.44 - 1.00 mg/dL   Calcium 9.3 8.9 - 10.3 mg/dL   GFR calc non Af Amer >60 >60 mL/min   GFR calc Af Amer >60 >60 mL/min   Anion gap 7 5 - 15    Comment: Performed at [facility], [place] [address] [place], [place] [address]  D-dimer, quantitative     Status: Abnormal   Collection Time: 11/15/19  5:16 PM  Result Value Ref Range   D-Dimer, Quant 5.88 (H) 0.00 - 0.50 ug/mL-FEU    Comment: (NOTE) At the manufacturer cut-off of 0.50 ug/mL FEU, this assay has been documented to exclude PE with a sensitivity and negative predictive value of 97 to 99%.  At this time, this assay has not been approved by the FDA to exclude DVT/VTE. Results should be correlated with clinical presentation. Performed at [facility], [place] [address], [place], [place] [address]   Troponin I (High Sensitivity)     Status: None   Collection Time: 11/15/19  5:16 PM  Result Value Ref Range   Troponin I (High Sensitivity) 6 <18 ng/L    Comment: (NOTE) Elevated high sensitivity troponin I (hsTnI) values and significant  changes across serial measurements may suggest ACS but many other  chronic and acute conditions are known to elevate hsTnI results.  Refer to the "Links" section for chest pain algorithms and additional  guidance. Performed at [facility], [place] [address], [place],  [address]   Brain natriuretic peptide     Status: None   Collection Time: 11/15/19  5:16 PM  Result Value Ref Range   B Natriuretic Peptide 41.6 0.0 - 100.0 pg/mL    Comment: Performed at [facility], [place] [address], [place], [place] [address]  Troponin I (High Sensitivity)     Status: None    Collection Time: 11/15/19  7:00 PM  Result Value Ref Range   Troponin I (High Sensitivity) 6 <18 ng/L    Comment: (NOTE) Elevated high sensitivity troponin I (hsTnI) values and significant  changes across serial measurements may suggest ACS but many other  chronic and acute conditions are known to elevate hsTnI results.  Refer to the "Links" section for chest pain algorithms and additional  guidance. Performed at [facility], [place] [address], [place], [place] [address]     Chemistries  Recent Labs  Lab 11/15/19 1716  NA 139  K 4.0  CL 103  CO2 29  GLUCOSE 137*  BUN 18  CREATININE 0.90  CALCIUM 9.3   ------------------------------------------------------------------------------------------------------------------  ------------------------------------------------------------------------------------------------------------------ GFR: Estimated Creatinine Clearance: 57.1 mL/min (by C-G formula based on SCr of 0.9 mg/dL). Liver Function Tests: No results for input(s): AST, ALT, ALKPHOS, BILITOT, PROT, ALBUMIN in the last 168 hours. No results for input(s): LIPASE, AMYLASE in the last 168 hours. No results for input(s): AMMONIA in the last 168 hours. Coagulation Profile: No results for input(s): INR, PROTIME in the  last 168 hours. Cardiac Enzymes: No results for input(s): CKTOTAL, CKMB, CKMBINDEX, TROPONINI in the last 168 hours. BNP (last 3 results) No results for input(s): PROBNP in the last 8760 hours. HbA1C: No results for input(s): HGBA1C in the last 72 hours. CBG: No results for input(s): GLUCAP in the last 168 hours. Lipid Profile: No results for input(s): CHOL, HDL, LDLCALC, TRIG, CHOLHDL, LDLDIRECT in the last 72 hours. Thyroid Function Tests: No results for input(s): TSH, T4TOTAL, FREET4, T3FREE, THYROIDAB in the last 72 hours. Anemia Panel: No results for input(s): VITAMINB12, FOLATE, FERRITIN, TIBC, IRON, RETICCTPCT in the last 72  hours.  --------------------------------------------------------------------------------------------------------------- Urine analysis:    Component Value Date/Time   COLORURINE YELLOW 11/06/2019 1749   APPEARANCEUR CLEAR 11/06/2019 1749   LABSPEC 1.012 11/06/2019 1749   PHURINE 8.0 11/06/2019 1749   GLUCOSEU NEGATIVE 11/06/2019 1749   HGBUR NEGATIVE 11/06/2019 1749   BILIRUBINUR NEGATIVE 11/06/2019 1749   BILIRUBINUR small 07/24/2018 [place] 11/06/2019 1749   PROTEINUR NEGATIVE 11/06/2019 1749   UROBILINOGEN 1.0 07/24/2018 1706   UROBILINOGEN 1.0 12/10/2013 2001   NITRITE NEGATIVE 11/06/2019 1749   LEUKOCYTESUR NEGATIVE 11/06/2019 1749      Imaging Results:    CT Angio Chest PE W and/or Wo Contrast  Result Date: 11/15/2019 CLINICAL DATA:  Respiratory distress. Evaluate for pulmonary embolus. EXAM: CT ANGIOGRAPHY CHEST WITH CONTRAST TECHNIQUE: Multidetector CT imaging of the chest was performed using the standard protocol during bolus administration of intravenous contrast. Multiplanar CT image reconstructions and MIPs were obtained to evaluate the vascular anatomy. CONTRAST:  143mL OMNIPAQUE IOHEXOL 350 MG/ML SOLN COMPARISON:  Chest CT 08/04/2018 FINDINGS: Cardiovascular: Heart is mildly enlarged. Trace fluid superior pericardial recess. Thoracic aortic vascular calcifications. Aorta and main pulmonary artery normal in caliber. Small filling defects are demonstrated within the segmental right lower lobe pulmonary arteries (image 123; series 5). Additionally there is a small filling defect within the left lower lobe segmental pulmonary artery (image 162; series 5). Additionally small emboli demonstrated within the right upper lobe subsegmental pulmonary arteries (image 80; series 5). No CT evidence to suggest right heart strain.23 Mediastinum/Nodes: No enlarged axillary, mediastinal or hilar lymphadenopathy. Normal appearance of the esophagus. Lungs/Pleura: Central airways  are patent. No large area pulmonary consolidation. No pleural effusion pneumothorax. Unchanged 7 mm ground-glass nodule within the right upper lobe (image 48; series 6). No pleural effusion or pneumothorax. Upper Abdomen: Unremarkable. Musculoskeletal: No aggressive or acute appearing osseous lesions. Unchanged chronic appearing T12 compression deformity. Review of the MIP images confirms the above findings. IMPRESSION: 1. Bilateral segmental and subsegmental pulmonary emboli. No CT evidence for right heart strain. 2. Unchanged 7 mm ground-glass nodule within the right upper lobe. Recommend additional follow-up chest CT in 2 years. 3. Aortic Atherosclerosis (ICD10-I70.0). 4. Critical Value/emergent results were called by telephone at the time of interpretation on 11/15/2019 at 8:24 pm to provider [facility] , who verbally acknowledged these results. Electronically Signed   By: Lovey Newcomer M.D.   On: 11/15/2019 20:27   DG Chest Portable 1 View  Result Date: 11/15/2019 CLINICAL DATA:  Dyspnea. EXAM: PORTABLE CHEST 1 VIEW COMPARISON:  November 06, 2019 FINDINGS: The heart size and mediastinal contours are within normal limits. Both lungs are clear. Tortuosity of the descending thoracic aorta is seen. The visualized skeletal structures are unremarkable. IMPRESSION: Stable exam without evidence of acute or active cardiopulmonary disease. Electronically Signed   By: Virgina Norfolk M.D.   On: 11/15/2019 17:07   VAS  [place] LOWER EXTREMITY VENOUS (DVT) (ONLY [facility] & [facility] 7a-7p)  Result Date: 11/15/2019  Lower Venous DVTStudy Indications: Edema, and Swelling.  Comparison Study: 06/28/17 previous Performing Technologist: Abram Sander RVS  Examination Guidelines: A complete evaluation includes B-mode imaging, spectral Doppler, color Doppler, and power Doppler as needed of all accessible portions of each vessel. Bilateral testing is considered an integral part of a complete examination. Limited examinations for reoccurring  indications may be performed as noted. The reflux portion of the exam is performed with the patient in reverse Trendelenburg.  +---------+---------------+---------+-----------+----------+--------------+ RIGHT    CompressibilityPhasicitySpontaneityPropertiesThrombus Aging +---------+---------------+---------+-----------+----------+--------------+ CFV      Full           Yes      Yes                                 +---------+---------------+---------+-----------+----------+--------------+ SFJ      Full                                                        +---------+---------------+---------+-----------+----------+--------------+ FV Prox  Full                                                        +---------+---------------+---------+-----------+----------+--------------+ FV Mid   Full                                                        +---------+---------------+---------+-----------+----------+--------------+ FV DistalFull                                                        +---------+---------------+---------+-----------+----------+--------------+ PFV      Full                                                        +---------+---------------+---------+-----------+----------+--------------+ POP      Full           Yes      Yes                                 +---------+---------------+---------+-----------+----------+--------------+ PTV      Full                                                        +---------+---------------+---------+-----------+----------+--------------+ PERO     Full                                                        +---------+---------------+---------+-----------+----------+--------------+   +----+---------------+---------+-----------+----------+--------------+  LEFTCompressibilityPhasicitySpontaneityPropertiesThrombus Aging +----+---------------+---------+-----------+----------+--------------+ CFV Full            Yes      Yes                                 +----+---------------+---------+-----------+----------+--------------+     Summary: RIGHT: - There is no evidence of deep vein thrombosis in the lower extremity.  - No cystic structure found in the popliteal fossa.  LEFT: - No evidence of common femoral vein obstruction.  *See table(s) above for measurements and observations.    Preliminary        Assessment & Plan:    Principal Problem:   Pulmonary embolism (HCC) Active Problems:   Bipolar 1 disorder (HCC)   Hyperlipidemia with target LDL less than 130   Essential hypertension, benign  Acute pulmonary embolism Check trop I Check cardiac echo Heparin gtt  75mm Lung nodule , right upper lung,  Repeat CT recommended in 2 years  Hypertension Cont Amlodipine 5mg  po qday  Hyperlipidemia Cont Lipitor 10mg  po qday   Bipolar do Cont Latuda Cont Wellbutrin Cont Ingrezza   Severe spinal stenosis L4-5 Cont Gabapentin  Compression deformity at T12, chronic Please have bone density as outpatient  Vitamin D deficiency Cont Vitamin D as outpatient   Constipation Cont Colace   DVT Prophylaxis-   Heparin GTT- SCDs    AM Labs Ordered, also please review Full Orders  Family Communication: Admission, patients condition and plan of care including tests being ordered have been discussed with the patient who indicate understanding and agree with the plan and Code Status.  Code Status:  DNR per patient, notified sister that patient admitted to [facility]  Admission status:   Inpatient: Based on patients clinical presentation and evaluation of above clinical data, I have made determination that patient meets Inpatient criteria at this time. Pt has acute pulmonary embolism and will require > 2 nites stay,  Has high risk of clinical deterioration.   Time spent in minutes :  70   Jani Gravel M.D on 11/15/2019 at 8:57 PM

## 2019-11-15 NOTE — Progress Notes (Signed)
Lower extremity venous has been completed.   Preliminary results in CV Proc.   Abram Sander 11/15/2019 5:31 PM

## 2019-11-16 ENCOUNTER — Inpatient Hospital Stay (HOSPITAL_COMMUNITY): Payer: Medicare Other

## 2019-11-16 DIAGNOSIS — I2602 Saddle embolus of pulmonary artery with acute cor pulmonale: Secondary | ICD-10-CM

## 2019-11-16 DIAGNOSIS — K5900 Constipation, unspecified: Secondary | ICD-10-CM

## 2019-11-16 LAB — ECHOCARDIOGRAM COMPLETE
Height: 64 in
Weight: 2740.76 oz

## 2019-11-16 LAB — CBC
HCT: 40.7 % (ref 36.0–46.0)
Hemoglobin: 13.5 g/dL (ref 12.0–15.0)
MCH: 30.4 pg (ref 26.0–34.0)
MCHC: 33.2 g/dL (ref 30.0–36.0)
MCV: 91.7 fL (ref 80.0–100.0)
Platelets: 215 10*3/uL (ref 150–400)
RBC: 4.44 MIL/uL (ref 3.87–5.11)
RDW: 14 % (ref 11.5–15.5)
WBC: 11.2 10*3/uL — ABNORMAL HIGH (ref 4.0–10.5)
nRBC: 0 % (ref 0.0–0.2)

## 2019-11-16 LAB — COMPREHENSIVE METABOLIC PANEL
ALT: 37 U/L (ref 0–44)
AST: 25 U/L (ref 15–41)
Albumin: 3.1 g/dL — ABNORMAL LOW (ref 3.5–5.0)
Alkaline Phosphatase: 55 U/L (ref 38–126)
Anion gap: 5 (ref 5–15)
BUN: 13 mg/dL (ref 8–23)
CO2: 29 mmol/L (ref 22–32)
Calcium: 9.1 mg/dL (ref 8.9–10.3)
Chloride: 108 mmol/L (ref 98–111)
Creatinine, Ser: 0.67 mg/dL (ref 0.44–1.00)
GFR calc Af Amer: >60 mL/min (ref >60)
GFR calc non Af Amer: >60 mL/min (ref >60)
Glucose, Bld: 95 mg/dL (ref 70–99)
Potassium: 3.5 mmol/L (ref 3.5–5.1)
Sodium: 142 mmol/L (ref 135–145)
Total Bilirubin: 0.4 mg/dL (ref 0.3–1.2)
Total Protein: 6 g/dL — ABNORMAL LOW (ref 6.5–8.1)

## 2019-11-16 LAB — HEPARIN LEVEL (UNFRACTIONATED)
Heparin Unfractionated: 1.1 IU/mL — ABNORMAL HIGH (ref 0.30–0.70)
Heparin Unfractionated: 0.77 IU/mL — ABNORMAL HIGH (ref 0.30–0.70)

## 2019-11-16 LAB — TROPONIN I (HIGH SENSITIVITY): Troponin I (High Sensitivity): 8 ng/L (ref <18)

## 2019-11-16 MED ORDER — HEPARIN (PORCINE) 25000 UT/250ML-% IV SOLN
700.0000 [IU]/h | INTRAVENOUS | Status: AC
  Administered 2019-11-16 – 2019-11-18 (×3): 700 [IU]/h via INTRAVENOUS
  Filled 2019-11-16 (×2): qty 250

## 2019-11-16 MED ORDER — GABAPENTIN 300 MG PO CAPS
300.0000 mg | ORAL_CAPSULE | Freq: Three times a day (TID) | ORAL | Status: DC
  Administered 2019-11-16 – 2019-11-20 (×13): 300 mg via ORAL
  Filled 2019-11-16 (×14): qty 1

## 2019-11-16 MED ORDER — MECLIZINE HCL 25 MG PO TABS: 25.0000 mg | ORAL_TABLET | Freq: Three times a day (TID) | ORAL | Status: DC | PRN

## 2019-11-16 MED ORDER — ATORVASTATIN CALCIUM 10 MG PO TABS
10.0000 mg | ORAL_TABLET | Freq: Every day | ORAL | Status: DC
  Administered 2019-11-16 – 2019-11-20 (×5): 10 mg via ORAL
  Filled 2019-11-16 (×5): qty 1

## 2019-11-16 MED ORDER — DOCUSATE SODIUM 100 MG PO CAPS
100.0000 mg | ORAL_CAPSULE | Freq: Two times a day (BID) | ORAL | Status: DC
  Administered 2019-11-16 – 2019-11-20 (×9): 100 mg via ORAL
  Filled 2019-11-16 (×9): qty 1

## 2019-11-16 MED ORDER — LURASIDONE HCL 40 MG PO TABS
120.0000 mg | ORAL_TABLET | Freq: Every day | ORAL | Status: DC
  Administered 2019-11-16 – 2019-11-19 (×4): 120 mg via ORAL
  Filled 2019-11-16 (×5): qty 3

## 2019-11-16 MED ORDER — TRAMADOL HCL 50 MG PO TABS
50.0000 mg | ORAL_TABLET | Freq: Four times a day (QID) | ORAL | Status: DC | PRN
  Administered 2019-11-18 – 2019-11-19 (×4): 50 mg via ORAL
  Filled 2019-11-16 (×4): qty 1

## 2019-11-16 MED ORDER — VITAMIN D (ERGOCALCIFEROL) 1.25 MG (50000 UNIT) PO CAPS
50000.0000 [IU] | ORAL_CAPSULE | ORAL | Status: DC
  Administered 2019-11-18: 50000 [IU] via ORAL
  Filled 2019-11-16: qty 1

## 2019-11-16 MED ORDER — BUPROPION HCL ER (XL) 150 MG PO TB24
150.0000 mg | ORAL_TABLET | Freq: Every day | ORAL | Status: DC
  Administered 2019-11-16 – 2019-11-20 (×5): 150 mg via ORAL
  Filled 2019-11-16 (×5): qty 1

## 2019-11-16 MED ORDER — HEPARIN (PORCINE) 25000 UT/250ML-% IV SOLN: 800.0000 [IU]/h | INTRAVENOUS | Status: DC

## 2019-11-16 MED ORDER — BISACODYL 10 MG RE SUPP
10.0000 mg | Freq: Every day | RECTAL | Status: DC
  Administered 2019-11-16 – 2019-11-20 (×4): 10 mg via RECTAL
  Filled 2019-11-16 (×4): qty 1

## 2019-11-16 NOTE — Progress Notes (Signed)
This RN assumed patient care at 1730 this shift. I have reviewed the previous RN's assessment and agree with her findings. Pt is in no acute distress at this time and in no pain. Pt is sitting up comfortably eating dinner at this time. Will continue to monitor.

## 2019-11-16 NOTE — Plan of Care (Signed)
Patient resting in bed states that she is feeling better than yesterday, denies any pain. Patient reports she cannot remember when the last time that she had a BM was and reports that she needs a suppository usually to help her go. Paged Dr D. [place] Silos and let him know Problem: Education: Goal: Knowledge of General Education information will improve Description: Including pain rating scale, medication(s)/side effects and non-pharmacologic comfort measures Outcome: Progressing   Problem: Health Behavior/Discharge Planning: Goal: Ability to manage health-related needs will improve Outcome: Progressing   Problem: Clinical Measurements: Goal: Ability to maintain clinical measurements within normal limits will improve Outcome: Progressing Goal: Will remain free from infection Outcome: Progressing Goal: Diagnostic test results will improve Outcome: Progressing Goal: Respiratory complications will improve Outcome: Progressing Goal: Cardiovascular complication will be avoided Outcome: Progressing   Problem: Activity: Goal: Risk for activity intolerance will decrease Outcome: Progressing   Problem: Nutrition: Goal: Adequate nutrition will be maintained Outcome: Progressing   Problem: Coping: Goal: Level of anxiety will decrease Outcome: Progressing   Problem: Elimination: Goal: Will not experience complications related to bowel motility Outcome: Progressing Goal: Will not experience complications related to urinary retention Outcome: Progressing   Problem: Pain Managment: Goal: General experience of comfort will improve Outcome: Progressing   Problem: Safety: Goal: Ability to remain free from injury will improve Outcome: Progressing   Problem: Skin Integrity: Goal: Risk for impaired skin integrity will decrease Outcome: Progressing

## 2019-11-16 NOTE — Progress Notes (Signed)
ANTICOAGULATION CONSULT NOTE - Follow Up Consult  Pharmacy Consult for heparin Indication: acute pulmonary embolus  No Known Allergies  Patient Measurements: Height: 5\' 4"  (162.6 cm) Weight: 171 lb 4.8 oz (77.7 kg) IBW/kg (Calculated) : 54.7 Heparin Dosing Weight: 71 kg  Vital Signs: Temp: 98.7 F (37.1 C) (02/05 1403) Temp Source: Axillary (02/05 1403) BP: 143/83 (02/05 1403) Pulse Rate: 72 (02/05 1403)  Labs: Recent Labs    11/15/19 1716 11/15/19 1900 11/16/19 0502  HGB 15.1*  --  13.5  HCT 45.9  --  40.7  PLT 246  --  215  HEPARINUNFRC  --   --  1.10*  CREATININE 0.90  --  0.67  TROPONINIHS 6 6 8     Estimated Creatinine Clearance: 64.1 mL/min (by C-G formula based on SCr of 0.67 mg/dL).   Assessment: Patient's a 74 y.o F presented to the ED on 2/4 from [facility] with c/o SOB. D-dimer was elevated on admission.  Chest CTA showed bilateral PE (no evidence of right heart strain). LE doppler negative for DVT.  She's currently on heparin drip for acute PE.  Today, 11/16/2019: - heparin level is Supra-therapeutic at 0.77 - hgb down 13.5, plts ok - per pt's RN, no bleeding noted   Goal of Therapy:  Heparin level 0.3-0.7 units/ml Monitor platelets by anticoagulation protocol: Yes   Plan:  - reduce heparin drip to 700 units/hr - check 8 hr heparin level - monitor for s/s bleeding  Izzie Geers P 11/16/2019,2:26 PM

## 2019-11-16 NOTE — TOC Progression Note (Signed)
Transition of Care [facility]) - Progression Note    Patient Details  Name: Judy [name] MRN: [id number] Date of Birth: [birth year]  Transition of [facility]) CM/SW Contact  Purcell Mouton, RN Phone Number: 11/16/2019, 4:23 PM  Clinical Narrative:    Pt admitted from [facility]. Spoke with pt's sister [name] [phone] who states that she and her brother want pt to go back to [facility], when stable.        Expected Discharge Plan and Services                                                 Social Determinants of Health (SDOH) Interventions    Readmission Risk Interventions No flowsheet data found.

## 2019-11-16 NOTE — Progress Notes (Signed)
  Echocardiogram 2D Echocardiogram has been performed.  Judy [name] G Maxden Naji 11/16/2019, 1:55 PM

## 2019-11-16 NOTE — Progress Notes (Signed)
ANTICOAGULATION CONSULT NOTE -  Consult  Pharmacy Consult for Heparin Indication: pulmonary embolus  No Known Allergies  Patient Measurements: Height: 5\' 4"  (162.6 cm) Weight: 171 lb 4.8 oz (77.7 kg) IBW/kg (Calculated) : 54.7 HEPARIN DW (KG): 71.3   Vital Signs: Temp: 99.1 F (37.3 C) (02/05 0414) Temp Source: Oral (02/05 0414) BP: 135/77 (02/05 0414) Pulse Rate: 49 (02/05 0414)  Labs: Recent Labs    11/15/19 1716 11/15/19 1900 11/16/19 0502  HGB 15.1*  --  13.5  HCT 45.9  --  40.7  PLT 246  --  215  HEPARINUNFRC  --   --  1.10*  CREATININE 0.90  --   --   TROPONINIHS 6 6  --     Estimated Creatinine Clearance: 57 mL/min (by C-G formula based on SCr of 0.9 mg/dL).   Medical History: Past Medical History:  Diagnosis Date  . Allergic rhinitis   . Anemia   . Anxiety   . Arthritis   . Bipolar 1 disorder ([place])   . Depression   . Diverticulosis of colon (without mention of hemorrhage) 08/25/2011   [name]  . 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 Hyperlipidemia   . Hypertension   . Obesity   . Renal disorder   . Vertigo     Medications:  Infusions:   No anticoagulation PTA  Assessment: 74 yo F who presents from rehab facility with shortness of breath.  Chest CT + PE. Baseline CBC WNL.  No bleeding noted.  Pharmacy consulted to dose IV Heparin.  Today, 2/5 0502 HL = 1.10 above goal, no bleeding or infusion issues per RN. RN confirms  level was drawn from opposite arm as heparin drip.   Goal of Therapy:  Heparin level 0.3-0.7 units/ml Monitor platelets by anticoagulation protocol: Yes   Plan:  Decrease heparin drip to 800 units/hr Daily CBC/HL Check next HL in 8 hours  Dorrene German, PharmD, BCPS 11/16/2019,5:53 AM

## 2019-11-16 NOTE — Progress Notes (Signed)
PROGRESS NOTE    Judy [name]  LDJ:[id number] DOB: [birth year] DOA: 11/15/2019 PCP: [name], FNP    Brief Narrative:  Per Dr. Horton Chin Hayne  is a 74 y.o. female,  w  Bipolar do, hypertension, hyperlipidemia, vertigo, obeisity, L4-5 spinal stenosis, no family hx of PE, w recently presents from rehab due to dyspnea, chest pain starting this am. Pt sent for r/o PE  In ED T 98.9, P 64  R 15, Bp 127/83  Pox 98% on RA Wt 78 kg  CTA chest IMPRESSION: 1. Bilateral segmental and subsegmental pulmonary emboli. No CT evidence for right heart strain. 2. Unchanged 7 mm ground-glass nodule within the right upper lobe. Recommend additional follow-up chest CT in 2 years.  3. Aortic Atherosclerosis (ICD10-I70.0).  Lower extremity ultrasound negative for DVT preliminary read  Wbc 8.2, Hgb 15.1, Plt 246 Na 139, K 4.0, Bun 18, Creatinine 0.90 D dimer 5.88  Trop 6  BNP 41.6   Assessment & Plan:   Principal Problem:   Pulmonary embolism (HCC) Active Problems:   Bipolar 1 disorder (HCC)   Hyperlipidemia with target LDL less than 130   Essential hypertension, benign  1 acute bilateral segmental and subsegmental PE Patient had presented with pleuritic chest pain, shortness of breath CT chest noted to have acute bilateral PE with no right ventricular strain.  Lower extremity Dopplers negative for DVT.  2D echo pending.  Cardiac enzymes negative.  Blood pressure stable.  Continue IV heparin.  Follow.  2.  7 mm lung nodule right upper lung Outpatient follow-up with repeat CT scan in 2 years.  3.  Hypertension Stable.  Continue home regimen of Norvasc 5 mg daily.  4.  Hyperlipidemia Continue statin.  5.  Bipolar disorder Continue Wellbutrin, Latuda, Ingrezza.  6.  Severe spinal stenosis L4-L5 Continue gabapentin.  PT/OT.  7.  Chronic compression deformity T12 We will need outpatient bone density scan done.  Pain management.  PT/OT.  8.  Vitamin D  deficiency Continue vitamin D supplementation as outpatient.  9.  Constipation Place on daily Dulcolax suppositories.  Continue Colace twice daily.  Follow.   DVT prophylaxis: Heparin drip Code Status: DNR Family Communication: Updated patient.  No family at bedside. Disposition Plan:  . Patient came from: SNF            . Anticipated d/c place: SNF . Barriers to d/c OR conditions which need to be met to effect a safe d/c: Likely back to skilled nursing facility once patient has improved clinically and transition from heparin drip to oral anticoagulation hopefully in the next 48 to 72 hours.   Consultants:   None  Procedures:   CT angiogram chest 11/15/2019  Chest x-ray 11/15/2019  Lower extremity Dopplers 11/15/2019  2D echo pending  Antimicrobials:   None   Subjective: Patient states shortness of breath improving from admission.  Denies any chest pain.  No abdominal pain.  Feeling somewhat better.  Objective: Vitals:   11/15/19 2130 11/15/19 2235 11/16/19 0414 11/16/19 0843  BP: (!) 144/72 125/74 135/77   Pulse: (!) 57 (!) 54 (!) 49   Resp: (!) '25 20 18 20  '$ Temp:  98.2 F (36.8 C) 99.1 F (37.3 C)   TempSrc:  Oral Oral   SpO2: 95% 100% 100%   Weight:   77.7 kg   Height:        Intake/Output Summary (Last 24 hours) at 11/16/2019 1227 Last data filed at 11/16/2019 0428 Gross per 24 hour  Intake --  Output 550 ml  Net -550 ml   Filed Weights   11/15/19 1613 11/16/19 0414  Weight: 78 kg 77.7 kg    Examination:  General exam: Appears calm and comfortable  Respiratory system: Clear to auscultation. Respiratory effort normal. Cardiovascular system: S1 & S2 heard, RRR. No JVD, murmurs, rubs, gallops or clicks. No pedal edema. Gastrointestinal system: Abdomen is nondistended, soft and nontender. No organomegaly or masses felt. Normal bowel sounds heard. Central nervous system: Alert and oriented. No focal neurological deficits. Extremities: Symmetric 5 x 5  power. Skin: No rashes, lesions or ulcers Psychiatry: Judgement and insight appear normal. Mood & affect appropriate.     Data Reviewed: I have personally reviewed following labs and imaging studies  CBC: Recent Labs  Lab 11/15/19 1716 11/16/19 0502  WBC 8.2 11.2*  NEUTROABS 7.1  --   HGB 15.1* 13.5  HCT 45.9 40.7  MCV 92.2 91.7  PLT 246 960   Basic Metabolic Panel: Recent Labs  Lab 11/15/19 1716 11/16/19 0502  NA 139 142  K 4.0 3.5  CL 103 108  CO2 29 29  GLUCOSE 137* 95  BUN 18 13  CREATININE 0.90 0.67  CALCIUM 9.3 9.1   GFR: Estimated Creatinine Clearance: 64.1 mL/min (by C-G formula based on SCr of 0.67 mg/dL). Liver Function Tests: Recent Labs  Lab 11/16/19 0502  AST 25  ALT 37  ALKPHOS 55  BILITOT 0.4  PROT 6.0*  ALBUMIN 3.1*   No results for input(s): LIPASE, AMYLASE in the last 168 hours. No results for input(s): AMMONIA in the last 168 hours. Coagulation Profile: No results for input(s): INR, PROTIME in the last 168 hours. Cardiac Enzymes: No results for input(s): CKTOTAL, CKMB, CKMBINDEX, TROPONINI in the last 168 hours. BNP (last 3 results) No results for input(s): PROBNP in the last 8760 hours. HbA1C: No results for input(s): HGBA1C in the last 72 hours. CBG: No results for input(s): GLUCAP in the last 168 hours. Lipid Profile: No results for input(s): CHOL, HDL, LDLCALC, TRIG, CHOLHDL, LDLDIRECT in the last 72 hours. Thyroid Function Tests: No results for input(s): TSH, T4TOTAL, FREET4, T3FREE, THYROIDAB in the last 72 hours. Anemia Panel: No results for input(s): VITAMINB12, FOLATE, FERRITIN, TIBC, IRON, RETICCTPCT in the last 72 hours. Sepsis Labs: No results for input(s): PROCALCITON, LATICACIDVEN in the last 168 hours.  Recent Results (from the past 240 hour(s))  SARS CORONAVIRUS 2 (TAT 6-24 HRS) Nasopharyngeal Nasopharyngeal Swab     Status: None   Collection Time: 11/06/19  8:17 PM   Specimen: Nasopharyngeal Swab  Result Value  Ref Range Status   SARS Coronavirus 2 NEGATIVE NEGATIVE Final    Comment: (NOTE) SARS-CoV-2 target nucleic acids are NOT DETECTED. The SARS-CoV-2 RNA is generally detectable in upper and lower respiratory specimens during the acute phase of infection. Negative results do not preclude SARS-CoV-2 infection, do not rule out co-infections with other pathogens, and should not be used as the sole basis for treatment or other patient management decisions. Negative results must be combined with clinical observations, patient history, and epidemiological information. The expected result is Negative. Fact Sheet for Patients: SugarRoll.be Fact Sheet for Healthcare Providers: https://www.woods-mathews.com/ This test is not yet approved or cleared by the [place] FDA and  has been authorized for detection and/or diagnosis of SARS-CoV-2 by FDA under an Emergency Use Authorization (EUA). This EUA will remain  in effect (meaning this test can be used) for the duration of the COVID-19 declaration under Section  56 4(b)(1) of the Act, 21 U.S.C. section 360bbb-3(b)(1), unless the authorization is terminated or revoked sooner. Performed at [facility], [address], [place], [place] [address]   Respiratory Panel by RT PCR (Flu A&B, Covid) - Nasopharyngeal Swab     Status: None   Collection Time: 11/15/19  5:04 PM   Specimen: Nasopharyngeal Swab  Result Value Ref Range Status   SARS Coronavirus 2 by RT PCR NEGATIVE NEGATIVE Final    Comment: (NOTE) SARS-CoV-2 target nucleic acids are NOT DETECTED. The SARS-CoV-2 RNA is generally detectable in upper respiratoy specimens during the acute phase of infection. The lowest concentration of SARS-CoV-2 viral copies this assay can detect is 131 copies/mL. A negative result does not preclude SARS-Cov-2 infection and should not be used as the sole basis for treatment or other patient management decisions. A  negative result may occur with  improper specimen collection/handling, submission of specimen other than nasopharyngeal swab, presence of viral mutation(s) within the areas targeted by this assay, and inadequate number of viral copies (<131 copies/mL). A negative result must be combined with clinical observations, patient history, and epidemiological information. The expected result is Negative. Fact Sheet for Patients:  PinkCheek.be Fact Sheet for Healthcare Providers:  GravelBags.it This test is not yet ap proved or cleared by the [place] FDA and  has been authorized for detection and/or diagnosis of SARS-CoV-2 by FDA under an Emergency Use Authorization (EUA). This EUA will remain  in effect (meaning this test can be used) for the duration of the COVID-19 declaration under Section 564(b)(1) of the Act, 21 U.S.C. section 360bbb-3(b)(1), unless the authorization is terminated or revoked sooner.    Influenza A by PCR NEGATIVE NEGATIVE Final   Influenza B by PCR NEGATIVE NEGATIVE Final    Comment: (NOTE) The Xpert Xpress SARS-CoV-2/FLU/RSV assay is intended as an aid in  the diagnosis of influenza from Nasopharyngeal swab specimens and  should not be used as a sole basis for treatment. Nasal washings and  aspirates are unacceptable for Xpert Xpress SARS-CoV-2/FLU/RSV  testing. Fact Sheet for Patients: PinkCheek.be Fact Sheet for Healthcare Providers: GravelBags.it This test is not yet approved or cleared by the [place] FDA and  has been authorized for detection and/or diagnosis of SARS-CoV-2 by  FDA under an Emergency Use Authorization (EUA). This EUA will remain  in effect (meaning this test can be used) for the duration of the  Covid-19 declaration under Section 564(b)(1) of the Act, 21  U.S.C. section 360bbb-3(b)(1), unless the authorization is   terminated or revoked. Performed at [facility], [address] [place], [place] [address]          Radiology Studies: CT Angio Chest PE W and/or Wo Contrast  Result Date: 11/15/2019 CLINICAL DATA:  Respiratory distress. Evaluate for pulmonary embolus. EXAM: CT ANGIOGRAPHY CHEST WITH CONTRAST TECHNIQUE: Multidetector CT imaging of the chest was performed using the standard protocol during bolus administration of intravenous contrast. Multiplanar CT image reconstructions and MIPs were obtained to evaluate the vascular anatomy. CONTRAST:  13m OMNIPAQUE IOHEXOL 350 MG/ML SOLN COMPARISON:  Chest CT 08/04/2018 FINDINGS: Cardiovascular: Heart is mildly enlarged. Trace fluid superior pericardial recess. Thoracic aortic vascular calcifications. Aorta and main pulmonary artery normal in caliber. Small filling defects are demonstrated within the segmental right lower lobe pulmonary arteries (image 123; series 5). Additionally there is a small filling defect within the left lower lobe segmental pulmonary artery (image 162; series 5). Additionally small emboli demonstrated within the  right upper lobe subsegmental pulmonary arteries (image 80; series 5). No CT evidence to suggest right heart strain.23 Mediastinum/Nodes: No enlarged axillary, mediastinal or hilar lymphadenopathy. Normal appearance of the esophagus. Lungs/Pleura: Central airways are patent. No large area pulmonary consolidation. No pleural effusion pneumothorax. Unchanged 7 mm ground-glass nodule within the right upper lobe (image 48; series 6). No pleural effusion or pneumothorax. Upper Abdomen: Unremarkable. Musculoskeletal: No aggressive or acute appearing osseous lesions. Unchanged chronic appearing T12 compression deformity. Review of the MIP images confirms the above findings. IMPRESSION: 1. Bilateral segmental and subsegmental pulmonary emboli. No CT evidence for right heart strain. 2. Unchanged 7 mm ground-glass nodule  within the right upper lobe. Recommend additional follow-up chest CT in 2 years. 3. Aortic Atherosclerosis (ICD10-I70.0). 4. Critical Value/emergent results were called by telephone at the time of interpretation on 11/15/2019 at 8:24 pm to provider [facility] , who verbally acknowledged these results. Electronically Signed   By: Lovey Newcomer M.D.   On: 11/15/2019 20:27   DG Chest Portable 1 View  Result Date: 11/15/2019 CLINICAL DATA:  Dyspnea. EXAM: PORTABLE CHEST 1 VIEW COMPARISON:  November 06, 2019 FINDINGS: The heart size and mediastinal contours are within normal limits. Both lungs are clear. Tortuosity of the descending thoracic aorta is seen. The visualized skeletal structures are unremarkable. IMPRESSION: Stable exam without evidence of acute or active cardiopulmonary disease. Electronically Signed   By: Virgina Norfolk M.D.   On: 11/15/2019 17:07   VAS [place] LOWER EXTREMITY VENOUS (DVT) (ONLY [facility] & [facility] 7a-7p)  Result Date: 11/15/2019  Lower Venous DVTStudy Indications: Edema, and Swelling.  Comparison Study: 06/28/17 previous Performing Technologist: Abram Sander RVS  Examination Guidelines: A complete evaluation includes B-mode imaging, spectral Doppler, color Doppler, and power Doppler as needed of all accessible portions of each vessel. Bilateral testing is considered an integral part of a complete examination. Limited examinations for reoccurring indications may be performed as noted. The reflux portion of the exam is performed with the patient in reverse Trendelenburg.  +---------+---------------+---------+-----------+----------+--------------+ RIGHT    CompressibilityPhasicitySpontaneityPropertiesThrombus Aging +---------+---------------+---------+-----------+----------+--------------+ CFV      Full           Yes      Yes                                 +---------+---------------+---------+-----------+----------+--------------+ SFJ      Full                                                         +---------+---------------+---------+-----------+----------+--------------+ FV Prox  Full                                                        +---------+---------------+---------+-----------+----------+--------------+ FV Mid   Full                                                        +---------+---------------+---------+-----------+----------+--------------+ FV DistalFull                                                        +---------+---------------+---------+-----------+----------+--------------+  PFV      Full                                                        +---------+---------------+---------+-----------+----------+--------------+ POP      Full           Yes      Yes                                 +---------+---------------+---------+-----------+----------+--------------+ PTV      Full                                                        +---------+---------------+---------+-----------+----------+--------------+ PERO     Full                                                        +---------+---------------+---------+-----------+----------+--------------+   +----+---------------+---------+-----------+----------+--------------+ LEFTCompressibilityPhasicitySpontaneityPropertiesThrombus Aging +----+---------------+---------+-----------+----------+--------------+ CFV Full           Yes      Yes                                 +----+---------------+---------+-----------+----------+--------------+     Summary: RIGHT: - There is no evidence of deep vein thrombosis in the lower extremity.  - No cystic structure found in the popliteal fossa.  LEFT: - No evidence of common femoral vein obstruction.  *See table(s) above for measurements and observations.    Preliminary         Scheduled Meds: . atorvastatin  10 mg Oral Daily  . bisacodyl  10 mg Rectal Daily  . buPROPion  150 mg Oral Daily  . docusate sodium  100 mg Oral  BID  . gabapentin  300 mg Oral TID  . lurasidone  120 mg Oral QHS  . [START ON 11/18/2019] Vitamin D (Ergocalciferol)  50,000 Units Oral Q7 days   Continuous Infusions: . heparin 800 Units/hr (11/16/19 0555)     LOS: 1 day    Time spent: 40 minutes    Irine Seal, MD [facility]   To contact the attending provider between 7A-7P or the covering provider during after hours 7P-7A, please log into the web site [email or web address] and access using universal [place] password for that web site. If you do not have the password, please call the hospital operator.  11/16/2019, 12:27 PM

## 2019-11-17 LAB — CBC
HCT: 41.4 % (ref 36.0–46.0)
Hemoglobin: 13.4 g/dL (ref 12.0–15.0)
MCH: 30.6 pg (ref 26.0–34.0)
MCHC: 32.4 g/dL (ref 30.0–36.0)
MCV: 94.5 fL (ref 80.0–100.0)
Platelets: 226 10*3/uL (ref 150–400)
RBC: 4.38 MIL/uL (ref 3.87–5.11)
RDW: 14.6 % (ref 11.5–15.5)
WBC: 6.8 10*3/uL (ref 4.0–10.5)
nRBC: 0 % (ref 0.0–0.2)

## 2019-11-17 LAB — BASIC METABOLIC PANEL
Anion gap: 5 (ref 5–15)
BUN: 18 mg/dL (ref 8–23)
CO2: 31 mmol/L (ref 22–32)
Calcium: 9.2 mg/dL (ref 8.9–10.3)
Chloride: 106 mmol/L (ref 98–111)
Creatinine, Ser: 0.86 mg/dL (ref 0.44–1.00)
GFR calc Af Amer: >60 mL/min (ref >60)
GFR calc non Af Amer: >60 mL/min (ref >60)
Glucose, Bld: 96 mg/dL (ref 70–99)
Potassium: 4 mmol/L (ref 3.5–5.1)
Sodium: 142 mmol/L (ref 135–145)

## 2019-11-17 LAB — HEPARIN LEVEL (UNFRACTIONATED)
Heparin Unfractionated: 0.45 IU/mL (ref 0.30–0.70)
Heparin Unfractionated: 0.61 IU/mL (ref 0.30–0.70)

## 2019-11-17 NOTE — Progress Notes (Signed)
ANTICOAGULATION CONSULT NOTE - Follow Up Consult  Pharmacy Consult for heparin Indication: acute pulmonary embolus  No Known Allergies  Patient Measurements: Height: 5\' 4"  (162.6 cm) Weight: 171 lb 4.8 oz (77.7 kg) IBW/kg (Calculated) : 54.7 Heparin Dosing Weight: 71 kg  Vital Signs: Temp: 98.9 F (37.2 C) (02/05 1946) Temp Source: Oral (02/05 1946) BP: 130/62 (02/05 1946) Pulse Rate: 50 (02/05 1946)  Labs: Recent Labs    11/15/19 1716 11/15/19 1900 11/16/19 0502 11/16/19 1347 11/16/19 2304  HGB 15.1*  --  13.5  --   --   HCT 45.9  --  40.7  --   --   PLT 246  --  215  --   --   HEPARINUNFRC  --   --  1.10* 0.77* 0.45  CREATININE 0.90  --  0.67  --   --   TROPONINIHS 6 6 8   --   --     Estimated Creatinine Clearance: 64.1 mL/min (by C-G formula based on SCr of 0.67 mg/dL).   Assessment: Patient's a 74 y.o F presented to the ED on 2/4 from [facility] with c/o SOB. D-dimer was elevated on admission.  Chest CTA showed bilateral PE (no evidence of right heart strain). LE doppler negative for DVT.  She's currently on heparin drip for acute PE.  2/5 - heparin level is Supra-therapeutic at 0.77 - hgb down 13.5, plts ok - per pt's RN, no bleeding noted -2304 HL = 0.45 at goal, no problems with infusion or bleeding reported by RN.    Goal of Therapy:  Heparin level 0.3-0.7 units/ml Monitor platelets by anticoagulation protocol: Yes   Plan:  - continue heparin drip at 700 units/hr - recheck confirmatory HL today - monitor for s/s bleeding  Dorrene German 11/17/2019,1:34 AM

## 2019-11-17 NOTE — Progress Notes (Signed)
PROGRESS NOTE    Judy [name]  OZH:[id number] DOB: 12-01-1945 DOA: 11/15/2019 PCP: [name], FNP    Brief Narrative:  Per Dr. Horton Chin Judy [name]  is a 74 y.o. female,  w  Bipolar do, hypertension, hyperlipidemia, vertigo, obeisity, L4-5 spinal stenosis, no family hx of PE, w recently presents from rehab due to dyspnea, chest pain starting this am. Pt sent for r/o PE  In ED T 98.9, P 64  R 15, Bp 127/83  Pox 98% on RA Wt 78 kg  CTA chest IMPRESSION: 1. Bilateral segmental and subsegmental pulmonary emboli. No CT evidence for right heart strain. 2. Unchanged 7 mm ground-glass nodule within the right upper lobe. Recommend additional follow-up chest CT in 2 years.  3. Aortic Atherosclerosis (ICD10-I70.0).  Lower extremity ultrasound negative for DVT preliminary read  Wbc 8.2, Hgb 15.1, Plt 246 Na 139, K 4.0, Bun 18, Creatinine 0.90 D dimer 5.88  Trop 6  BNP 41.6   Assessment & Plan:   Principal Problem:   Pulmonary embolism (HCC) Active Problems:   Bipolar 1 disorder (HCC)   Hyperlipidemia with target LDL less than 130   Essential hypertension, benign  1 acute bilateral segmental and subsegmental PE Patient had presented with pleuritic chest pain, shortness of breath CT chest noted to have acute bilateral PE with no right ventricular strain.  Lower extremity Dopplers negative for DVT.  2D echo with a EF of 60 to 65%, moderately increased LVH, normal right ventricular systolic function, mildly dilated left atrial size. Patient improving clinically. Cardiac enzymes negative.  Blood pressure stable.  Continue IV heparin. Could likely transition to oral anticoagulation of either Eliquis tomorrow. Follow.  2.  7 mm lung nodule right upper lung Outpatient follow-up with repeat CT scan in 2 years.  3.  Hypertension Continue Norvasc 5 mg daily.  4.  Hyperlipidemia Statin.   5.  Bipolar disorder Continue Wellbutrin, Latuda, Ingrezza.  6.  Severe spinal stenosis  L4-L5 Continue gabapentin.  PT/OT.  7.  Chronic compression deformity T12 We will need outpatient bone density scan done.  Pain management.  PT/OT.  8.  Vitamin D deficiency Continue vitamin D supplementation as outpatient.  9.  Constipation Patient with good bowel movement noted on 2019-11-16. Continue daily Dulcolax suppositories.  Continue Colace twice daily.    DVT prophylaxis: Heparin drip Code Status: DNR Family Communication: Updated patient.  No family at bedside. Disposition Plan:  . Patient came from: SNF            . Anticipated d/c place: SNF . Barriers to d/c OR conditions which need to be met to effect a safe d/c: Likely back to skilled nursing facility once patient has improved clinically and transition from heparin drip to oral anticoagulation hopefully in the next 24 to 48 hours.   Consultants:   None  Procedures:   CT angiogram chest 11/15/2019  Chest x-ray 11/15/2019  Lower extremity Dopplers 11/15/2019  2D echo 11/16/2019   Antimicrobials:   None   Subjective: Patient sitting up in chair. States shortness of breath improving. States pleuritic chest pain improving. Asking whether she can go back to bed.  Objective: Vitals:   11/16/19 1403 11/16/19 1946 11/17/19 0500 11/17/19 0511  BP: (!) 143/83 130/62  (!) 141/70  Pulse: 72 (!) 50  86  Resp: _0 Temp: 98.7 F (37.1 C) 98.9 F (37.2 C)  97.6 F (36.4 C)  TempSrc: Axillary Oral  Oral  SpO2: 100% 97%  97%  Weight:   80.8 kg   Height:        Intake/Output Summary (Last 24 hours) at 11/17/2019 1205 Last data filed at 11/17/2019 0600 Gross per 24 hour  Intake 195.67 ml  Output 1225 ml  Net -1029.33 ml   Filed Weights   11/15/19 1613 11/16/19 0414 11/17/19 0500  Weight: 78 kg 77.7 kg 80.8 kg    Examination:  General exam: NAD Respiratory system: CTAB. No wheezes, no crackles, no rhonchi. Normal respiratory effort.  Cardiovascular system: Regular rate rhythm no murmurs rubs or  gallops. No JVD. No lower extremity edema. Gastrointestinal system: Abdomen is soft, nontender, nondistended, positive bowel sounds. No rebound. No guarding.  Central nervous system: Alert and oriented. No focal neurological deficits. Extremities: Symmetric 5 x 5 power. Skin: No rashes, lesions or ulcers Psychiatry: Judgement and insight appear normal. Mood & affect appropriate.     Data Reviewed: I have personally reviewed following labs and imaging studies  CBC: Recent Labs  Lab 11/15/19 1716 11/16/19 0502 11/17/19 0932  WBC 8.2 11.2* 6.8  NEUTROABS 7.1  --   --   HGB 15.1* 13.5 13.4  HCT 45.9 40.7 41.4  MCV 92.2 91.7 94.5  PLT 246 215 563   Basic Metabolic Panel: Recent Labs  Lab 11/15/19 1716 11/16/19 0502 11/17/19 0932  NA 139 142 142  K 4.0 3.5 4.0  CL 103 108 106  CO2 _0 GLUCOSE 137* 95 96  BUN _1 CREATININE 0.90 0.67 0.86  CALCIUM 9.3 9.1 9.2   GFR: Estimated Creatinine Clearance: 60.8 mL/min (by C-G formula based on SCr of 0.86 mg/dL). Liver Function Tests: Recent Labs  Lab 11/16/19 0502  AST 25  ALT 37  ALKPHOS 55  BILITOT 0.4  PROT 6.0*  ALBUMIN 3.1*   No results for input(s): LIPASE, AMYLASE in the last 168 hours. No results for input(s): AMMONIA in the last 168 hours. Coagulation Profile: No results for input(s): INR, PROTIME in the last 168 hours. Cardiac Enzymes: No results for input(s): CKTOTAL, CKMB, CKMBINDEX, TROPONINI in the last 168 hours. BNP (last 3 results) No results for input(s): PROBNP in the last 8760 hours. HbA1C: No results for input(s): HGBA1C in the last 72 hours. CBG: No results for input(s): GLUCAP in the last 168 hours. Lipid Profile: No results for input(s): CHOL, HDL, LDLCALC, TRIG, CHOLHDL, LDLDIRECT in the last 72 hours. Thyroid Function Tests: No results for input(s): TSH, T4TOTAL, FREET4, T3FREE, THYROIDAB in the last 72 hours. Anemia Panel: No results for input(s): VITAMINB12, FOLATE,  FERRITIN, TIBC, IRON, RETICCTPCT in the last 72 hours. Sepsis Labs: No results for input(s): PROCALCITON, LATICACIDVEN in the last 168 hours.  Recent Results (from the past 240 hour(s))  Respiratory Panel by RT PCR (Flu A&B, Covid) - Nasopharyngeal Swab     Status: None   Collection Time: 11/15/19  5:04 PM   Specimen: Nasopharyngeal Swab  Result Value Ref Range Status   SARS Coronavirus 2 by RT PCR NEGATIVE NEGATIVE Final    Comment: (NOTE) SARS-CoV-2 target nucleic acids are NOT DETECTED. The SARS-CoV-2 RNA is generally detectable in upper respiratoy specimens during the acute phase of infection. The lowest concentration of SARS-CoV-2 viral copies this assay can detect is 131 copies/mL. A negative result does not preclude SARS-Cov-2 infection and should not be used as the sole basis for treatment or other patient management decisions. A negative result may occur with  improper specimen collection/handling, submission of specimen other  than nasopharyngeal swab, presence of viral mutation(s) within the areas targeted by this assay, and inadequate number of viral copies (<131 copies/mL). A negative result must be combined with clinical observations, patient history, and epidemiological information. The expected result is Negative. Fact Sheet for Patients:  PinkCheek.be Fact Sheet for Healthcare Providers:  GravelBags.it This test is not yet ap proved or cleared by the [place] FDA and  has been authorized for detection and/or diagnosis of SARS-CoV-2 by FDA under an Emergency Use Authorization (EUA). This EUA will remain  in effect (meaning this test can be used) for the duration of the COVID-19 declaration under Section 564(b)(1) of the Act, 21 U.S.C. section 360bbb-3(b)(1), unless the authorization is terminated or revoked sooner.    Influenza A by PCR NEGATIVE NEGATIVE Final   Influenza B by PCR NEGATIVE NEGATIVE Final     Comment: (NOTE) The Xpert Xpress SARS-CoV-2/FLU/RSV assay is intended as an aid in  the diagnosis of influenza from Nasopharyngeal swab specimens and  should not be used as a sole basis for treatment. Nasal washings and  aspirates are unacceptable for Xpert Xpress SARS-CoV-2/FLU/RSV  testing. Fact Sheet for Patients: PinkCheek.be Fact Sheet for Healthcare Providers: GravelBags.it This test is not yet approved or cleared by the [place] FDA and  has been authorized for detection and/or diagnosis of SARS-CoV-2 by  FDA under an Emergency Use Authorization (EUA). This EUA will remain  in effect (meaning this test can be used) for the duration of the  Covid-19 declaration under Section 564(b)(1) of the Act, 21  U.S.C. section 360bbb-3(b)(1), unless the authorization is  terminated or revoked. Performed at [facility], [place] [address], [place], [place] [address]          Radiology Studies: CT Angio Chest PE W and/or Wo Contrast  Result Date: 11/15/2019 CLINICAL DATA:  Respiratory distress. Evaluate for pulmonary embolus. EXAM: CT ANGIOGRAPHY CHEST WITH CONTRAST TECHNIQUE: Multidetector CT imaging of the chest was performed using the standard protocol during bolus administration of intravenous contrast. Multiplanar CT image reconstructions and MIPs were obtained to evaluate the vascular anatomy. CONTRAST:  117m OMNIPAQUE IOHEXOL 350 MG/ML SOLN COMPARISON:  Chest CT 08/04/2018 FINDINGS: Cardiovascular: Heart is mildly enlarged. Trace fluid superior pericardial recess. Thoracic aortic vascular calcifications. Aorta and main pulmonary artery normal in caliber. Small filling defects are demonstrated within the segmental right lower lobe pulmonary arteries (image 123; series 5). Additionally there is a small filling defect within the left lower lobe segmental pulmonary artery (image 162; series 5). Additionally  small emboli demonstrated within the right upper lobe subsegmental pulmonary arteries (image 80; series 5). No CT evidence to suggest right heart strain.23 Mediastinum/Nodes: No enlarged axillary, mediastinal or hilar lymphadenopathy. Normal appearance of the esophagus. Lungs/Pleura: Central airways are patent. No large area pulmonary consolidation. No pleural effusion pneumothorax. Unchanged 7 mm ground-glass nodule within the right upper lobe (image 48; series 6). No pleural effusion or pneumothorax. Upper Abdomen: Unremarkable. Musculoskeletal: No aggressive or acute appearing osseous lesions. Unchanged chronic appearing T12 compression deformity. Review of the MIP images confirms the above findings. IMPRESSION: 1. Bilateral segmental and subsegmental pulmonary emboli. No CT evidence for right heart strain. 2. Unchanged 7 mm ground-glass nodule within the right upper lobe. Recommend additional follow-up chest CT in 2 years. 3. Aortic Atherosclerosis (ICD10-I70.0). 4. Critical Value/emergent results were called by telephone at the time of interpretation on 11/15/2019 at 8:24 pm [facility] who verbally acknowledged these results. Electronically Signed  By: Lovey Newcomer M.D.   On: 11/15/2019 20:27   DG Chest Portable 1 View  Result Date: 11/15/2019 CLINICAL DATA:  Dyspnea. EXAM: PORTABLE CHEST 1 VIEW COMPARISON:  November 06, 2019 FINDINGS: The heart size and mediastinal contours are within normal limits. Both lungs are clear. Tortuosity of the descending thoracic aorta is seen. The visualized skeletal structures are unremarkable. IMPRESSION: Stable exam without evidence of acute or active cardiopulmonary disease. Electronically Signed   By: Virgina Norfolk M.D.   On: 11/15/2019 17:07   ECHOCARDIOGRAM COMPLETE  Result Date: 11/16/2019   ECHOCARDIOGRAM REPORT   Patient Name:   Judy [name] Date of Exam: 11/16/2019 Medical Rec #:  [id number]       Height:       64.0 in Accession #:    [id number]       Weight:       171.3 lb Date of Birth:  Jul 26, 1946       BSA:          1.83 m Patient Age:    21 years        BP:           135/77 mmHg Patient Gender: F               HR:           61 bpm. Exam Location:  Inpatient Procedure: 2D Echo, Cardiac Doppler and Color Doppler Indications:    I26.02 Pulmonary embolus  History:        Patient has prior history of Echocardiogram examinations, most                 recent 10/18/2016. Risk Factors:Hypertension and Dyslipidemia.  Sonographer:    Jonelle Sidle Dance Referring Phys: Coleman  1. Left ventricular ejection fraction, by visual estimation, is 60 to 65%. The left ventricle has normal function. There is moderately increased left ventricular hypertrophy.  2. Left ventricular diastolic parameters are indeterminate.  3. Global right ventricle has normal systolic function.The right ventricular size is normal.  4. Left atrial size was mildly dilated.  5. Right atrial size was normal.  6. The mitral valve is normal in structure. No evidence of mitral valve regurgitation.  7. The tricuspid valve is normal in structure. Tricuspid valve regurgitation is trivial.  8. The aortic valve is tricuspid. Aortic valve regurgitation is not visualized. No evidence of aortic valve stenosis.  9. The pulmonic valve was not well visualized. Pulmonic valve regurgitation is not visualized. 10. Normal pulmonary artery systolic pressure. FINDINGS  Left Ventricle: Left ventricular ejection fraction, by visual estimation, is 60 to 65%. The left ventricle has normal function. The left ventricle has no regional wall motion abnormalities. There is moderately increased left ventricular hypertrophy. Left ventricular diastolic parameters are indeterminate. Right Ventricle: The right ventricular size is normal. No increase in right ventricular wall thickness. Global RV systolic function is has normal systolic function. The tricuspid regurgitant velocity is 2.08 m/s, and with an assumed right atrial  pressure  of 3 mmHg, the estimated right ventricular systolic pressure is normal at 20.3 mmHg. Left Atrium: Left atrial size was mildly dilated. Right Atrium: Right atrial size was normal in size Pericardium: Trivial pericardial effusion is present. Mitral Valve: The mitral valve is normal in structure. No evidence of mitral valve regurgitation. Tricuspid Valve: The tricuspid valve is normal in structure. Tricuspid valve regurgitation is trivial. Aortic Valve: The aortic valve is tricuspid. Aortic valve regurgitation is not visualized.  The aortic valve is structurally normal, with no evidence of sclerosis or stenosis. Pulmonic Valve: The pulmonic valve was not well visualized. Pulmonic valve regurgitation is not visualized. Pulmonic regurgitation is not visualized. Aorta: The aortic root and ascending aorta are structurally normal, with no evidence of dilitation. IAS/Shunts: The interatrial septum was not well visualized.  LEFT VENTRICLE PLAX 2D LVIDd:         4.20 cm  Diastology LVIDs:         2.50 cm  LV e' lateral:   8.81 cm/s LV PW:         1.40 cm  LV E/e' lateral: 7.7 LV IVS:        1.20 cm  LV e' medial:    6.20 cm/s LVOT diam:     2.10 cm  LV E/e' medial:  10.9 LV SV:         56 ml LV SV Index:   29.65 LVOT Area:     3.46 cm  RIGHT VENTRICLE          IVC RV Basal diam:  2.20 cm  IVC diam: 2.00 cm TAPSE (M-mode): 2.4 cm LEFT ATRIUM             Index       RIGHT ATRIUM           Index LA diam:        5.20 cm 2.84 cm/m  RA Area:     13.80 cm LA Vol (A2C):   62.6 ml 34.18 ml/m RA Volume:   31.20 ml  17.03 ml/m LA Vol (A4C):   60.4 ml 32.97 ml/m LA Biplane Vol: 64.5 ml 35.21 ml/m  AORTIC VALVE LVOT Vmax:   109.00 cm/s LVOT Vmean:  65.500 cm/s LVOT VTI:    0.266 m  AORTA Ao Root diam: 3.40 cm Ao Asc diam:  3.00 cm MITRAL VALVE                        TRICUSPID VALVE MV Area (PHT): 2.06 cm             TR Peak grad:   17.3 mmHg MV PHT:        107.01 msec          TR Vmax:        208.00 cm/s MV Decel Time: 369  msec MV E velocity: 67.80 cm/s 103 cm/s  SHUNTS MV A velocity: 90.30 cm/s 70.3 cm/s Systemic VTI:  0.27 m MV E/A ratio:  0.75       1.5       Systemic Diam: 2.10 cm  Oswaldo Milian MD Electronically signed by Oswaldo Milian MD Signature Date/Time: 11/16/2019/7:25:02 PM    Final    VAS [place] LOWER EXTREMITY VENOUS (DVT) (ONLY [facility] & [facility] 7a-7p)  Result Date: 11/15/2019  Lower Venous DVTStudy Indications: Edema, and Swelling.  Comparison Study: 06/28/17 previous Performing Technologist: Abram Sander RVS  Examination Guidelines: A complete evaluation includes B-mode imaging, spectral Doppler, color Doppler, and power Doppler as needed of all accessible portions of each vessel. Bilateral testing is considered an integral part of a complete examination. Limited examinations for reoccurring indications may be performed as noted. The reflux portion of the exam is performed with the patient in reverse Trendelenburg.  +---------+---------------+---------+-----------+----------+--------------+ RIGHT    CompressibilityPhasicitySpontaneityPropertiesThrombus Aging +---------+---------------+---------+-----------+----------+--------------+ CFV      Full           Yes      Yes                                 +---------+---------------+---------+-----------+----------+--------------+  SFJ      Full                                                        +---------+---------------+---------+-----------+----------+--------------+ FV Prox  Full                                                        +---------+---------------+---------+-----------+----------+--------------+ FV Mid   Full                                                        +---------+---------------+---------+-----------+----------+--------------+ FV DistalFull                                                        +---------+---------------+---------+-----------+----------+--------------+ PFV      Full                                                         +---------+---------------+---------+-----------+----------+--------------+ POP      Full           Yes      Yes                                 +---------+---------------+---------+-----------+----------+--------------+ PTV      Full                                                        +---------+---------------+---------+-----------+----------+--------------+ PERO     Full                                                        +---------+---------------+---------+-----------+----------+--------------+   +----+---------------+---------+-----------+----------+--------------+ LEFTCompressibilityPhasicitySpontaneityPropertiesThrombus Aging +----+---------------+---------+-----------+----------+--------------+ CFV Full           Yes      Yes                                 +----+---------------+---------+-----------+----------+--------------+     Summary: RIGHT: - There is no evidence of deep vein thrombosis in the lower extremity.  - No cystic structure found in the popliteal fossa.  LEFT: - No evidence of common femoral vein obstruction.  *See table(s) above for measurements and observations.    Preliminary  Scheduled Meds: . atorvastatin  10 mg Oral Daily  . bisacodyl  10 mg Rectal Daily  . buPROPion  150 mg Oral Daily  . docusate sodium  100 mg Oral BID  . gabapentin  300 mg Oral TID  . lurasidone  120 mg Oral QHS  . [START ON 11/18/2019] Vitamin D (Ergocalciferol)  50,000 Units Oral Q7 days   Continuous Infusions: . heparin 700 Units/hr (11/16/19 1659)     LOS: 2 days    Time spent: 40 minutes    Irine Seal, MD [facility]   To contact the attending provider between 7A-7P or the covering provider during after hours 7P-7A, please log into the web site [email or web address] and access using universal Gu-Win password for that web site. If you do not have the password, please call the hospital  operator.  11/17/2019, 12:05 PM

## 2019-11-17 NOTE — Progress Notes (Signed)
ANTICOAGULATION CONSULT NOTE - Follow Up Consult  Pharmacy Consult for heparin Indication: acute pulmonary embolus  No Known Allergies  Patient Measurements: Height: 5\' 4"  (162.6 cm) Weight: 178 lb 2.1 oz (80.8 kg) IBW/kg (Calculated) : 54.7 Heparin Dosing Weight: 71 kg  Vital Signs: Temp: 97.6 F (36.4 C) (02/06 0511) Temp Source: Oral (02/06 0511) BP: 141/70 (02/06 0511) Pulse Rate: 86 (02/06 0511)  Labs: Recent Labs    11/15/19 1716 11/15/19 1716 11/15/19 1900 11/16/19 0502 11/16/19 0502 11/16/19 1347 11/16/19 2304 11/17/19 0932  HGB 15.1*   < >  --  13.5  --   --   --  13.4  HCT 45.9  --   --  40.7  --   --   --  41.4  PLT 246  --   --  215  --   --   --  226  HEPARINUNFRC  --   --   --  1.10*   < > 0.77* 0.45 0.61  CREATININE 0.90  --   --  0.67  --   --   --  0.86  TROPONINIHS 6  --  6 8  --   --   --   --    < > = values in this interval not displayed.    Estimated Creatinine Clearance: 60.8 mL/min (by C-G formula based on SCr of 0.86 mg/dL).   Assessment: Patient's a 74 y.o F presented to the ED on 2/4 from [facility] with c/o SOB. D-dimer was elevated on admission.  Chest CTA showed bilateral PE (no evidence of right heart strain). LE doppler negative for DVT.  She's currently on heparin drip for acute PE.  Today, 11/17/2019: - heparin level is therapeutic at 0.61 - cbc stable.  - no bleeding documented  Goal of Therapy:  Heparin level 0.3-0.7 units/ml Monitor platelets by anticoagulation protocol: Yes   Plan:  - continue heparin drip at 700 units/hr - daily heparin level - monitor for s/s bleeding  [place] P 11/17/2019,10:23 AM

## 2019-11-18 LAB — CBC
HCT: 38.6 % (ref 36.0–46.0)
Hemoglobin: 12.3 g/dL (ref 12.0–15.0)
MCH: 30 pg (ref 26.0–34.0)
MCHC: 31.9 g/dL (ref 30.0–36.0)
MCV: 94.1 fL (ref 80.0–100.0)
Platelets: 229 10*3/uL (ref 150–400)
RBC: 4.1 MIL/uL (ref 3.87–5.11)
RDW: 14.6 % (ref 11.5–15.5)
WBC: 6.4 10*3/uL (ref 4.0–10.5)
nRBC: 0 % (ref 0.0–0.2)

## 2019-11-18 LAB — BASIC METABOLIC PANEL
Anion gap: 8 (ref 5–15)
BUN: 22 mg/dL (ref 8–23)
CO2: 26 mmol/L (ref 22–32)
Calcium: 8.9 mg/dL (ref 8.9–10.3)
Chloride: 106 mmol/L (ref 98–111)
Creatinine, Ser: 0.95 mg/dL (ref 0.44–1.00)
GFR calc Af Amer: >60 mL/min (ref >60)
GFR calc non Af Amer: 60 mL/min — ABNORMAL LOW (ref >60)
Glucose, Bld: 97 mg/dL (ref 70–99)
Potassium: 3.7 mmol/L (ref 3.5–5.1)
Sodium: 140 mmol/L (ref 135–145)

## 2019-11-18 LAB — HEPARIN LEVEL (UNFRACTIONATED): Heparin Unfractionated: 0.52 IU/mL (ref 0.30–0.70)

## 2019-11-18 MED ORDER — APIXABAN 5 MG PO TABS: 5.0000 mg | ORAL_TABLET | Freq: Two times a day (BID) | ORAL | Status: DC

## 2019-11-18 MED ORDER — APIXABAN 5 MG PO TABS
10.0000 mg | ORAL_TABLET | Freq: Two times a day (BID) | ORAL | Status: DC
  Administered 2019-11-18 – 2019-11-20 (×4): 10 mg via ORAL
  Filled 2019-11-18 (×4): qty 2

## 2019-11-18 NOTE — Discharge Instructions (Signed)
Information on my medicine - ELIQUIS (apixaban)  This medication education was reviewed with me or my healthcare representative as part of my discharge preparation.   Why was Eliquis prescribed for you? Eliquis was prescribed to treat blood clots that may have been found in the veins of your legs (deep vein thrombosis) or in your lungs (pulmonary embolism) and to reduce the risk of them occurring again.  What do You need to know about Eliquis ? The starting dose is 10 mg (two 5 mg tablets) taken TWICE daily for the FIRST SEVEN (7) DAYS, then on 11/25/2019  the dose is reduced to ONE 5 mg tablet taken TWICE daily.  Eliquis may be taken with or without food.   Try to take the dose about the same time in the morning and in the evening. If you have difficulty swallowing the tablet whole please discuss with your pharmacist how to take the medication safely.  Take Eliquis exactly as prescribed and DO NOT stop taking Eliquis without talking to the doctor who prescribed the medication.  Stopping may increase your risk of developing a new blood clot.  Refill your prescription before you run out.  After discharge, you should have regular check-up appointments with your healthcare provider that is prescribing your Eliquis.    What do you do if you miss a dose? If a dose of ELIQUIS is not taken at the scheduled time, take it as soon as possible on the same day and twice-daily administration should be resumed. The dose should not be doubled to make up for a missed dose.  Important Safety Information A possible side effect of Eliquis is bleeding. You should call your healthcare provider right away if you experience any of the following: ? Bleeding from an injury or your nose that does not stop. ? Unusual colored urine (red or dark brown) or unusual colored stools (red or black). ? Unusual bruising for unknown reasons. ? A serious fall or if you hit your head (even if there is no bleeding).  Some  medicines may interact with Eliquis and might increase your risk of bleeding or clotting while on Eliquis. To help avoid this, consult your healthcare provider or pharmacist prior to using any new prescription or non-prescription medications, including herbals, vitamins, non-steroidal anti-inflammatory drugs (NSAIDs) and supplements.  This website has more information on Eliquis (apixaban): http://www.eliquis.com/eliquis/home

## 2019-11-18 NOTE — Progress Notes (Signed)
PROGRESS NOTE    Judy [name]  MRN:[id number] DOB: 07/09/1946 DOA: 11/15/2019 PCP: Moore, Janece, FNP    Brief Narrative:  Per Dr. Kim Judy [name]  is a 74 y.o. female,  w  Bipolar do, hypertension, hyperlipidemia, vertigo, obeisity, L4-5 spinal stenosis, no family hx of PE, w recently presents from rehab due to dyspnea, chest pain starting this am. Pt sent for r/o PE  In ED T 98.9, P 64  R 15, Bp 127/83  Pox 98% on RA Wt 78 kg  CTA chest IMPRESSION: 1. Bilateral segmental and subsegmental pulmonary emboli. No CT evidence for right heart strain. 2. Unchanged 7 mm ground-glass nodule within the right upper lobe. Recommend additional follow-up chest CT in 2 years.  3. Aortic Atherosclerosis (ICD10-I70.0).  Lower extremity ultrasound negative for DVT preliminary read  Wbc 8.2, Hgb 15.1, Plt 246 Na 139, K 4.0, Bun 18, Creatinine 0.90 D dimer 5.88  Trop 6  BNP 41.6   Assessment & Plan:   Principal Problem:   Pulmonary embolism (HCC) Active Problems:   Bipolar 1 disorder (HCC)   Hyperlipidemia with target LDL less than 130   Essential hypertension, benign  1 acute bilateral segmental and subsegmental PE Patient had presented with pleuritic chest pain, shortness of breath CT chest noted to have acute bilateral PE with no right ventricular strain.  Lower extremity Dopplers negative for DVT.  2D echo with a EF of 60 to 65%, moderately increased LVH, normal right ventricular systolic function, mildly dilated left atrial size. Patient improving clinically. Cardiac enzymes negative.  Blood pressure stable.  Continue IV heparin and transition to oral Eliquis after 72 hours of heparin. Follow.  2.  7 mm lung nodule right upper lung Outpatient follow-up with repeat CT scan in 2 years.  3.  Hypertension Blood pressure was borderline and as such Norvasc discontinued. Monitor and may need to resume Norvasc in the next 1 to 2 days.  4.  Hyperlipidemia Continue statin.    5.  Bipolar disorder Continue Wellbutrin, Latuda, Ingrezza.  6.  Severe spinal stenosis L4-L5 Continue gabapentin.  PT/OT.  7.  Chronic compression deformity T12 We need outpatient bone density scan done.  Pain management.  PT/OT.  8.  Vitamin D deficiency Continue vitamin D supplementation as outpatient.  9.  Constipation Patient with good bowel movement noted on 11/16/2019. Continue daily Dulcolax suppositories and Colace.   DVT prophylaxis: Heparin drip Code Status: DNR Family Communication: Updated patient.  No family at bedside. Disposition Plan:  . Patient came from: SNF            . Anticipated d/c place: SNF . Barriers to d/c OR conditions which need to be met to effect a safe d/c: Likely back to skilled nursing facility once patient has improved clinically and transition from heparin drip to oral anticoagulation hopefully tomorrow.    Consultants:   None  Procedures:   CT angiogram chest 11/15/2019  Chest x-ray 11/15/2019  Lower extremity Dopplers 11/15/2019  2D echo 11/16/2019   Antimicrobials:   None   Subjective: Patient laying in bed. States improving daily. Shortness of breath improving. Pleuritic chest improvement. Denies any bleeding.   Objective: Vitals:   11/17/19 0511 11/17/19 1226 11/17/19 2010 11/18/19 0505  BP: (!) 141/70 (!) 145/80 116/75 (!) 102/58  Pulse: 86 62 66 (!) 46  Resp: 20 (!) 25 20 18  Temp: 97.6 F (36.4 C) 99 F (37.2 C) 98.9 F (37.2 C) 98.2 F (36.8 C)  TempSrc:   Oral Oral    SpO2: 97% 100% 98% 99%  Weight:      Height:        Intake/Output Summary (Last 24 hours) at 11/18/2019 1049 Last data filed at 11/18/2019 0719 Gross per 24 hour  Intake 583.29 ml  Output 2000 ml  Net -1416.71 ml   Filed Weights   11/15/19 1613 11/16/19 0414 11/17/19 0500  Weight: 78 kg 77.7 kg 80.8 kg    Examination:  General exam: NAD Respiratory system: Lungs clear to auscultation bilaterally. No wheezes, no crackles, no rhonchi. Normal  respiratory effort. Cardiovascular system: RRR no murmurs rubs or gallops. No JVD. No lower extremity edema. Gastrointestinal system: Abdomen is nontender, nondistended, soft, positive bowel sounds. No rebound. No guarding.  Central nervous system: Alert and oriented. No focal neurological deficits. Extremities: Symmetric 5 x 5 power. Skin: No rashes, lesions or ulcers Psychiatry: Judgement and insight appear normal. Mood & affect appropriate.     Data Reviewed: I have personally reviewed following labs and imaging studies  CBC: Recent Labs  Lab 11/15/19 1716 11/16/19 0502 11/17/19 0932 11/18/19 0405  WBC 8.2 11.2* 6.8 6.4  NEUTROABS 7.1  --   --   --   HGB 15.1* 13.5 13.4 12.3  HCT 45.9 40.7 41.4 38.6  MCV 92.2 91.7 94.5 94.1  PLT 246 215 226 229   Basic Metabolic Panel: Recent Labs  Lab 11/15/19 1716 11/16/19 0502 11/17/19 0932 11/18/19 0405  NA 139 142 142 140  K 4.0 3.5 4.0 3.7  CL 103 108 106 106  CO2 29 29 31 26  GLUCOSE 137* 95 96 97  BUN 18 13 18 22  CREATININE 0.90 0.67 0.86 0.95  CALCIUM 9.3 9.1 9.2 8.9   GFR: Estimated Creatinine Clearance: 55 mL/min (by C-G formula based on SCr of 0.95 mg/dL). Liver Function Tests: Recent Labs  Lab 11/16/19 0502  AST 25  ALT 37  ALKPHOS 55  BILITOT 0.4  PROT 6.0*  ALBUMIN 3.1*   No results for input(s): LIPASE, AMYLASE in the last 168 hours. No results for input(s): AMMONIA in the last 168 hours. Coagulation Profile: No results for input(s): INR, PROTIME in the last 168 hours. Cardiac Enzymes: No results for input(s): CKTOTAL, CKMB, CKMBINDEX, TROPONINI in the last 168 hours. BNP (last 3 results) No results for input(s): PROBNP in the last 8760 hours. HbA1C: No results for input(s): HGBA1C in the last 72 hours. CBG: No results for input(s): GLUCAP in the last 168 hours. Lipid Profile: No results for input(s): CHOL, HDL, LDLCALC, TRIG, CHOLHDL, LDLDIRECT in the last 72 hours. Thyroid Function Tests: No  results for input(s): TSH, T4TOTAL, FREET4, T3FREE, THYROIDAB in the last 72 hours. Anemia Panel: No results for input(s): VITAMINB12, FOLATE, FERRITIN, TIBC, IRON, RETICCTPCT in the last 72 hours. Sepsis Labs: No results for input(s): PROCALCITON, LATICACIDVEN in the last 168 hours.  Recent Results (from the past 240 hour(s))  Respiratory Panel by RT PCR (Flu A&B, Covid) - Nasopharyngeal Swab     Status: None   Collection Time: 11/15/19  5:04 PM   Specimen: Nasopharyngeal Swab  Result Value Ref Range Status   SARS Coronavirus 2 by RT PCR NEGATIVE NEGATIVE Final    Comment: (NOTE) SARS-CoV-2 target nucleic acids are NOT DETECTED. The SARS-CoV-2 RNA is generally detectable in upper respiratoy specimens during the acute phase of infection. The lowest concentration of SARS-CoV-2 viral copies this assay can detect is 131 copies/mL. A negative result does not preclude SARS-Cov-2 infection and   should not be used as the sole basis for treatment or other patient management decisions. A negative result may occur with  improper specimen collection/handling, submission of specimen other than nasopharyngeal swab, presence of viral mutation(s) within the areas targeted by this assay, and inadequate number of viral copies (<131 copies/mL). A negative result must be combined with clinical observations, patient history, and epidemiological information. The expected result is Negative. Fact Sheet for Patients:  https://www.fda.gov/media/142436/download Fact Sheet for Healthcare Providers:  https://www.fda.gov/media/142435/download This test is not yet ap proved or cleared by the [place] FDA and  has been authorized for detection and/or diagnosis of SARS-CoV-2 by FDA under an Emergency Use Authorization (EUA). This EUA will remain  in effect (meaning this test can be used) for the duration of the COVID-19 declaration under Section 564(b)(1) of the Act, 21 U.S.C. section 360bbb-3(b)(1), unless  the authorization is terminated or revoked sooner.    Influenza A by PCR NEGATIVE NEGATIVE Final   Influenza B by PCR NEGATIVE NEGATIVE Final    Comment: (NOTE) The Xpert Xpress SARS-CoV-2/FLU/RSV assay is intended as an aid in  the diagnosis of influenza from Nasopharyngeal swab specimens and  should not be used as a sole basis for treatment. Nasal washings and  aspirates are unacceptable for Xpert Xpress SARS-CoV-2/FLU/RSV  testing. Fact Sheet for Patients: https://www.fda.gov/media/142436/download Fact Sheet for Healthcare Providers: https://www.fda.gov/media/142435/download This test is not yet approved or cleared by the [place] FDA and  has been authorized for detection and/or diagnosis of SARS-CoV-2 by  FDA under an Emergency Use Authorization (EUA). This EUA will remain  in effect (meaning this test can be used) for the duration of the  Covid-19 declaration under Section 564(b)(1) of the Act, 21  U.S.C. section 360bbb-3(b)(1), unless the authorization is  terminated or revoked. Performed at [facility], [address] [place], [place] [address]          Radiology Studies: ECHOCARDIOGRAM COMPLETE  Result Date: 11/16/2019   ECHOCARDIOGRAM REPORT   Patient Name:   Judy [name] Date of Exam: 11/16/2019 Medical Rec #:  [id number]       Height:       64.0 in Accession #:    [id number]      Weight:       171.3 lb Date of Birth:  12/01/1945       BSA:          1.83 m Patient Age:    73 years        BP:           135/77 mmHg Patient Gender: F               HR:           61 bpm. Exam Location:  Inpatient Procedure: 2D Echo, Cardiac Doppler and Color Doppler Indications:    I26.02 Pulmonary embolus  History:        Patient has prior history of Echocardiogram examinations, most                 recent 10/18/2016. Risk Factors:Hypertension and Dyslipidemia.  Sonographer:    Tiffany Dance Referring Phys: [id number] JAMES KIM IMPRESSIONS  1. Left ventricular ejection  fraction, by visual estimation, is 60 to 65%. The left ventricle has normal function. There is moderately increased left ventricular hypertrophy.  2. Left ventricular diastolic parameters are indeterminate.  3. Global right ventricle has normal systolic function.The right ventricular size is normal.  4. Left atrial size   was mildly dilated.  5. Right atrial size was normal.  6. The mitral valve is normal in structure. No evidence of mitral valve regurgitation.  7. The tricuspid valve is normal in structure. Tricuspid valve regurgitation is trivial.  8. The aortic valve is tricuspid. Aortic valve regurgitation is not visualized. No evidence of aortic valve stenosis.  9. The pulmonic valve was not well visualized. Pulmonic valve regurgitation is not visualized. 10. Normal pulmonary artery systolic pressure. FINDINGS  Left Ventricle: Left ventricular ejection fraction, by visual estimation, is 60 to 65%. The left ventricle has normal function. The left ventricle has no regional wall motion abnormalities. There is moderately increased left ventricular hypertrophy. Left ventricular diastolic parameters are indeterminate. Right Ventricle: The right ventricular size is normal. No increase in right ventricular wall thickness. Global RV systolic function is has normal systolic function. The tricuspid regurgitant velocity is 2.08 m/s, and with an assumed right atrial pressure  of 3 mmHg, the estimated right ventricular systolic pressure is normal at 20.3 mmHg. Left Atrium: Left atrial size was mildly dilated. Right Atrium: Right atrial size was normal in size Pericardium: Trivial pericardial effusion is present. Mitral Valve: The mitral valve is normal in structure. No evidence of mitral valve regurgitation. Tricuspid Valve: The tricuspid valve is normal in structure. Tricuspid valve regurgitation is trivial. Aortic Valve: The aortic valve is tricuspid. Aortic valve regurgitation is not visualized. The aortic valve is  structurally normal, with no evidence of sclerosis or stenosis. Pulmonic Valve: The pulmonic valve was not well visualized. Pulmonic valve regurgitation is not visualized. Pulmonic regurgitation is not visualized. Aorta: The aortic root and ascending aorta are structurally normal, with no evidence of dilitation. IAS/Shunts: The interatrial septum was not well visualized.  LEFT VENTRICLE PLAX 2D LVIDd:         4.20 cm  Diastology LVIDs:         2.50 cm  LV e' lateral:   8.81 cm/s LV PW:         1.40 cm  LV E/e' lateral: 7.7 LV IVS:        1.20 cm  LV e' medial:    6.20 cm/s LVOT diam:     2.10 cm  LV E/e' medial:  10.9 LV SV:         56 ml LV SV Index:   29.65 LVOT Area:     3.46 cm  RIGHT VENTRICLE          IVC RV Basal diam:  2.20 cm  IVC diam: 2.00 cm TAPSE (M-mode): 2.4 cm LEFT ATRIUM             Index       RIGHT ATRIUM           Index LA diam:        5.20 cm 2.84 cm/m  RA Area:     13.80 cm LA Vol (A2C):   62.6 ml 34.18 ml/m RA Volume:   31.20 ml  17.03 ml/m LA Vol (A4C):   60.4 ml 32.97 ml/m LA Biplane Vol: 64.5 ml 35.21 ml/m  AORTIC VALVE LVOT Vmax:   109.00 cm/s LVOT Vmean:  65.500 cm/s LVOT VTI:    0.266 m  AORTA Ao Root diam: 3.40 cm Ao Asc diam:  3.00 cm MITRAL VALVE                        TRICUSPID VALVE MV Area (PHT): 2.06 cm               TR Peak grad:   17.3 mmHg MV PHT:        107.01 msec          TR Vmax:        208.00 cm/s MV Decel Time: 369 msec MV E velocity: 67.80 cm/s 103 cm/s  SHUNTS MV A velocity: 90.30 cm/s 70.3 cm/s Systemic VTI:  0.27 m MV E/A ratio:  0.75       1.5       Systemic Diam: 2.10 cm  Oswaldo Milian MD Electronically signed by Oswaldo Milian MD Signature Date/Time: 11/16/2019/7:25:02 PM    Final         Scheduled Meds: . atorvastatin  10 mg Oral Daily  . bisacodyl  10 mg Rectal Daily  . buPROPion  150 mg Oral Daily  . docusate sodium  100 mg Oral BID  . gabapentin  300 mg Oral TID  . lurasidone  120 mg Oral QHS  . Vitamin D (Ergocalciferol)  50,000  Units Oral Q7 days   Continuous Infusions: . heparin 700 Units/hr (11/18/19 0719)     LOS: 3 days    Time spent: 40 minutes    Irine Seal, MD [facility]   To contact the attending provider between 7A-7P or the covering provider during after hours 7P-7A, please log into the web site [email or web address] and access using universal [place] password for that web site. If you do not have the password, please call the hospital operator.  11/18/2019, 10:49 AM

## 2019-11-18 NOTE — Evaluation (Signed)
Occupational Therapy Evaluation Patient Details Name: Judy [name] MRN: [id number] DOB: Aug 07, 1946 Today's Date: 11/18/2019    History of Present Illness Judy [name]  is a 74 y.o. female,  w  Bipolar do, hypertension, hyperlipidemia, vertigo, obeisity, L4-5 spinal stenosis, no family hx of PE, w recently presents from rehab due to dyspnea, chest pain starting this am. Patient diagnosed with bilateral pulmonary embolism   Clinical Impression   Patient is a 74 year old female that was recently discharged to rehab facility, was living alone prior with difficulty caring for herself. Patient reports she had not started therapy at rehab yet "wasn't there long enough." Currently patient requires min A for functional transfers with increased time and cues for safety and body mechanics. Patient requires maximal assistance for LB ADLs with total A pulling up bilateral socks. Recommend continued acute OT services to maximize patient safety and independence with self care.     Follow Up Recommendations  SNF;Supervision/Assistance - 24 hour    Equipment Recommendations  Other (comment)(defer to next venue)       Precautions / Restrictions Precautions Precautions: Fall Restrictions Weight Bearing Restrictions: No      Mobility Bed Mobility Overal bed mobility: Needs Assistance Bed Mobility: Supine to Sit     Supine to sit: Min assist     General bed mobility comments: assist with mobilizing R LE toward edge of bed and for trunk support  Transfers Overall transfer level: Needs assistance Equipment used: Rolling walker (2 wheeled) Transfers: Sit to/from Stand Sit to Stand: Min assist         General transfer comment: cues for hand placement not to pull on walker    Balance Overall balance assessment: Needs assistance Sitting-balance support: Feet supported;No upper extremity supported Sitting balance-Leahy Scale: Good     Standing balance support: Bilateral upper extremity  supported;During functional activity Standing balance-Leahy Scale: Poor Standing balance comment: reliant on B UE support                           ADL either performed or assessed with clinical judgement   ADL Overall ADL's : Needs assistance/impaired     Grooming: Set up;Sitting   Upper Body Bathing: Set up;Sitting   Lower Body Bathing: Maximal assistance;Sitting/lateral leans;Sit to/from stand   Upper Body Dressing : Set up;Sitting   Lower Body Dressing: Maximal assistance;Sitting/lateral leans;Sit to/from stand Lower Body Dressing Details (indicate cue type and reason): patient require total A to pull up socks  Toilet Transfer: Minimal assistance;BSC;RW;Ambulation Toilet Transfer Details (indicate cue type and reason): simulated with recliner transfer, increased time with verbal cues for hand placement. Toileting- Water quality scientist and Hygiene: Sit to/from stand;Moderate assistance       Functional mobility during ADLs: Minimal assistance;Rolling walker;Cueing for safety                    Pertinent Vitals/Pain Pain Assessment: 0-10 Pain Score: 8  Pain Location: LEs Pain Descriptors / Indicators: Discomfort;Grimacing Pain Intervention(s): Monitored during session;Premedicated before session;Other (comment)(notified RN)     Hand Dominance Right   Extremity/Trunk Assessment Upper Extremity Assessment Upper Extremity Assessment: Generalized weakness   Lower Extremity Assessment Lower Extremity Assessment: Defer to PT evaluation   Cervical / Trunk Assessment Cervical / Trunk Assessment: Kyphotic   Communication Communication Communication: HOH   Cognition Arousal/Alertness: Awake/alert Behavior During Therapy: Flat affect Overall Cognitive Status: No family/caregiver present to determine baseline cognitive functioning  General Comments: increased time for task initiation   General Comments                Home Living Family/patient expects to be discharged to:: Skilled nursing facility                                 Additional Comments: patient reports she was not at rehab long before coming back to hospital and was requiring help with self care/mobility. Prior to hospitalizations and rehab placement patient was living alone with difficulty caring for herself.      Prior Functioning/Environment Level of Independence: Independent with assistive device(s)        Comments: patient was using rollator at home before hospitalizations and rehab plaacement        OT Problem List: Decreased strength;Decreased activity tolerance;Impaired balance (sitting and/or standing);Decreased safety awareness      OT Treatment/Interventions: Self-care/ADL training;Therapeutic exercise;Energy conservation;DME and/or AE instruction;Therapeutic activities;Cognitive remediation/compensation;Patient/family education;Balance training    OT Goals(Current goals can be found in the care plan section) Acute Rehab OT Goals Patient Stated Goal: to feel better OT Goal Formulation: With patient Time For Goal Achievement: 12/02/19 Potential to Achieve Goals: Good  OT Frequency: Min 2X/week           Co-evaluation PT/OT/SLP Co-Evaluation/Treatment: Yes Reason for Co-Treatment: For patient/therapist safety;To address functional/ADL transfers   OT goals addressed during session: ADL's and self-care      AM-PAC OT "6 Clicks" Daily Activity     Outcome Measure Help from another person eating meals?: None Help from another person taking care of personal grooming?: A Little Help from another person toileting, which includes using toliet, bedpan, or urinal?: A Lot Help from another person bathing (including washing, rinsing, drying)?: A Lot Help from another person to put on and taking off regular upper body clothing?: A Little Help from another person to put on and taking off  regular lower body clothing?: A Lot 6 Click Score: 16   End of Session Equipment Utilized During Treatment: Rolling walker;Gait belt Nurse Communication: Mobility status  Activity Tolerance: Patient tolerated treatment well Patient left: in chair;with call bell/phone within reach;with chair alarm set  OT Visit Diagnosis: Other abnormalities of gait and mobility (R26.89);Muscle weakness (generalized) (M62.81)                Time: 8882-8003 OT Time Calculation (min): 22 min Charges:  OT General Charges $OT Visit: 1 Visit OT Evaluation $OT Eval Moderate Complexity: [place] OT OT office: [place] 11/18/2019, 1:31 PM

## 2019-11-18 NOTE — Progress Notes (Signed)
ANTICOAGULATION CONSULT NOTE - Follow Up Consult  Pharmacy Consult for heparin Indication: acute pulmonary embolus  No Known Allergies  Patient Measurements: Height: 5\' 4"  (162.6 cm) Weight: 178 lb 2.1 oz (80.8 kg) IBW/kg (Calculated) : 54.7 Heparin Dosing Weight: 71 kg  Vital Signs: Temp: 98.2 F (36.8 C) (02/07 0505) BP: 102/58 (02/07 0505) Pulse Rate: 46 (02/07 0505)  Labs: Recent Labs    11/15/19 1716 11/15/19 1716 11/15/19 1900 11/16/19 0502 11/16/19 0502 11/16/19 1347 11/16/19 2304 11/17/19 0932 11/18/19 0405  HGB 15.1*   < >  --  13.5   < >  --   --  13.4 12.3  HCT 45.9   < >  --  40.7  --   --   --  41.4 38.6  PLT 246   < >  --  215  --   --   --  226 229  HEPARINUNFRC  --   --   --  1.10*  --    < > 0.45 0.61 0.52  CREATININE 0.90   < >  --  0.67  --   --   --  0.86 0.95  TROPONINIHS 6  --  6 8  --   --   --   --   --    < > = values in this interval not displayed.    Estimated Creatinine Clearance: 55 mL/min (by C-G formula based on SCr of 0.95 mg/dL).   Assessment: Patient's a 74 y.o F presented to the ED on 2/4 from [facility] with c/o SOB. D-dimer was elevated on admission.  Chest CTA showed bilateral PE (no evidence of right heart strain). LE doppler negative for DVT.  She's currently on heparin drip for acute PE.  Today, 11/18/2019: - heparin level remains therapeutic at 0.52 - cbc stable.  - no bleeding documented  Goal of Therapy:  Heparin level 0.3-0.7 units/ml Monitor platelets by anticoagulation protocol: Yes   Plan:  - continue heparin drip at 700 units/hr - daily heparin level - monitor for s/s bleeding  Diara Chaudhari P 11/18/2019,11:12 AM

## 2019-11-18 NOTE — Evaluation (Signed)
Physical Therapy Evaluation Patient Details Name: Judy [name] MRN: [id number] DOB: 09-04-1946 Today's Date: 11/18/2019   History of Present Illness  Judy [name]  is a 74 y.o. female,  w  Bipolar do, hypertension, hyperlipidemia, vertigo, obeisity, L4-5 spinal stenosis, no family hx of PE, w recently presents from rehab due to dyspnea, chest pain starting this am. Patient diagnosed with bilateral pulmonary embolism  Clinical Impression  Pt admitted as above and presenting with functional mobility limitations 2* generalized weakness, balance deficits, poor endurance and bil LE pain increased with WB.  Pt would benefit from follow up rehab at SNF level.    Follow Up Recommendations SNF    Equipment Recommendations  None recommended by PT    Recommendations for Other Services OT consult     Precautions / Restrictions Precautions Precautions: Fall Restrictions Weight Bearing Restrictions: No      Mobility  Bed Mobility Overal bed mobility: Needs Assistance Bed Mobility: Supine to Sit     Supine to sit: Min assist     General bed mobility comments: Increased time with assist with mobilizing R LE toward edge of bed and for trunk support  Transfers Overall transfer level: Needs assistance Equipment used: Rolling walker (2 wheeled) Transfers: Sit to/from Stand Sit to Stand: Min assist         General transfer comment: cues for hand placement not to pull on walker  Ambulation/Gait Ambulation/Gait assistance: Min assist;+2 safety/equipment Gait Distance (Feet): 28 Feet Assistive device: Rolling walker (2 wheeled) Gait Pattern/deviations: Step-to pattern;Step-through pattern;Decreased step length - right;Decreased step length - left;Shuffle;Wide base of support Gait velocity: decreased   General Gait Details: cues for posture, position from RW, safety awareness, and increased stride length  Stairs            Wheelchair Mobility    Modified Rankin (Stroke  Patients Only)       Balance Overall balance assessment: Needs assistance Sitting-balance support: Feet supported;No upper extremity supported Sitting balance-Leahy Scale: Good     Standing balance support: Bilateral upper extremity supported;During functional activity Standing balance-Leahy Scale: Poor Standing balance comment: reliant on B UE support                             Pertinent Vitals/Pain Pain Assessment: 0-10 Pain Score: 8  Pain Location: LEs Pain Descriptors / Indicators: Discomfort;Grimacing Pain Intervention(s): Limited activity within patient's tolerance;Monitored during session;Premedicated before session    Home Living Family/patient expects to be discharged to:: Skilled nursing facility                 Additional Comments: patient reports she was not at rehab long before coming back to hospital and was requiring help with self care/mobility. Prior to hospitalizations and rehab placement patient was living alone with difficulty caring for herself.    Prior Function Level of Independence: Independent with assistive device(s)         Comments: patient was using rollator at home before hospitalizations and rehab plaacement     Hand Dominance   Dominant Hand: Right    Extremity/Trunk Assessment   Upper Extremity Assessment Upper Extremity Assessment: Generalized weakness    Lower Extremity Assessment Lower Extremity Assessment: Generalized weakness    Cervical / Trunk Assessment Cervical / Trunk Assessment: Kyphotic  Communication   Communication: HOH  Cognition Arousal/Alertness: Awake/alert Behavior During Therapy: Flat affect Overall Cognitive Status: No family/caregiver present to determine baseline cognitive functioning  General Comments: increased time for task initiation      General Comments      Exercises     Assessment/Plan    PT Assessment Patient needs  continued PT services  PT Problem List Decreased strength;Decreased balance;Decreased mobility;Decreased activity tolerance;Decreased knowledge of use of DME;Decreased safety awareness       PT Treatment Interventions DME instruction;Functional mobility training;Balance training;Patient/family education;Therapeutic activities;Gait training;Stair training;Therapeutic exercise    PT Goals (Current goals can be found in the Care Plan section)  Acute Rehab PT Goals Patient Stated Goal: less leg pain PT Goal Formulation: With patient Time For Goal Achievement: 11/21/19 Potential to Achieve Goals: Good    Frequency Min 3X/week   Barriers to discharge        Co-evaluation PT/OT/SLP Co-Evaluation/Treatment: Yes Reason for Co-Treatment: For patient/therapist safety PT goals addressed during session: Mobility/safety with mobility OT goals addressed during session: ADL's and self-care       AM-PAC PT "6 Clicks" Mobility  Outcome Measure Help needed turning from your back to your side while in a flat bed without using bedrails?: A Little Help needed moving from lying on your back to sitting on the side of a flat bed without using bedrails?: A Little Help needed moving to and from a bed to a chair (including a wheelchair)?: A Lot Help needed standing up from a chair using your arms (e.g., wheelchair or bedside chair)?: A Little Help needed to walk in hospital room?: A Lot Help needed climbing 3-5 steps with a railing? : Total 6 Click Score: 14    End of Session Equipment Utilized During Treatment: Gait belt Activity Tolerance: Patient tolerated treatment well Patient left: with call bell/phone within reach;in chair;with chair alarm set Nurse Communication: Mobility status PT Visit Diagnosis: Muscle weakness (generalized) (M62.81);Difficulty in walking, not elsewhere classified (R26.2);Unsteadiness on feet (R26.81)    Time: 1047-1110 PT Time Calculation (min) (ACUTE ONLY): 23  min   Charges:   PT Evaluation $PT Eval Low Complexity: 1 Low          [place] Pager [phone] Office [phone]   Nyemah Watton 11/18/2019, 4:19 PM

## 2019-11-19 LAB — BASIC METABOLIC PANEL
Anion gap: 6 (ref 5–15)
BUN: 20 mg/dL (ref 8–23)
CO2: 28 mmol/L (ref 22–32)
Calcium: 9 mg/dL (ref 8.9–10.3)
Chloride: 104 mmol/L (ref 98–111)
Creatinine, Ser: 0.93 mg/dL (ref 0.44–1.00)
GFR calc Af Amer: >60 mL/min (ref >60)
GFR calc non Af Amer: >60 mL/min (ref >60)
Glucose, Bld: 94 mg/dL (ref 70–99)
Potassium: 4.2 mmol/L (ref 3.5–5.1)
Sodium: 138 mmol/L (ref 135–145)

## 2019-11-19 NOTE — TOC Progression Note (Signed)
Transition of Care [facility]) - Progression Note    Patient Details  Name: Judy [name] MRN: [id number] Date of Birth: [birth year]  Transition of Care [facility]) CM/SW Contact  Purcell Mouton, RN Phone Number: 11/19/2019, 10:47 AM  Clinical Narrative:    A call to [facility] was made there are no non-COVID beds. Family did not hold pt's bed.         Expected Discharge Plan and Services                                                 Social Determinants of Health (SDOH) Interventions    Readmission Risk Interventions No flowsheet data found.

## 2019-11-19 NOTE — NC FL2 (Signed)
[place] MEDICAID FL2 LEVEL OF CARE SCREENING TOOL     IDENTIFICATION  Patient Name: Judy [name] Birthdate: 10/18/1945 Sex: female Admission Date (Current Location): 11/15/2019  [facility] and [place] Number:  Herbalist and Address:  [facility],  [place], [place]      Provider Number: [id number]  Attending Physician Name and Address:  Eugenie Filler, MD  Relative Name and Phone Number:       Current Level of Care: Hospital Recommended Level of Care: [place] Prior Approval Number:    Date Approved/Denied:   PASRR Number: [id number]  Discharge Plan: SNF    Current Diagnoses: Patient Active Problem List   Diagnosis Date Noted  . Pulmonary embolism ([place]) 11/15/2019  . Spinal stenosis 11/07/2019  . Weakness of both lower extremities 11/06/2019  . Generalized anxiety disorder 07/24/2018  . Solitary pulmonary nodule on lung CT 06/13/2017  . Spinal stenosis at L4-L5 level   . Dysphagia 05/25/2017  . Tardive dyskinesia 05/25/2017  . Dyspnea on exertion 05/25/2017  . Fall 05/25/2017  . Bilateral leg pain   . Chronic pain of right knee 03/25/2017  . High risk medication use 03/25/2017  . Family hx of colon cancer 03/25/2017  . Occupational exposure in workplace 03/25/2017  . Depressed bipolar I disorder in partial remission ([place]) 03/25/2017  . Routine general medical examination at a health care facility 06/04/2014  . Hyperlipidemia with target LDL less than 130 03/20/2014  . Essential hypertension, benign 03/20/2014  . Unspecified constipation 03/20/2014  . Light headedness 03/20/2014  . Acute on chronic kidney disease, stage 3 03/20/2014  . Hypokalemia 03/20/2014  . Encounter for therapeutic drug monitoring 03/20/2014  . Other dysphagia 12/26/2013  . GERD (gastroesophageal reflux disease) 12/21/2013  . Constipation 12/21/2013  . Syncope 12/10/2013  . Leukocytosis 12/10/2013  . URI (upper respiratory  infection) 12/10/2013  . Bipolar 1 disorder ([place])   . Obesity (BMI 30-39.9)   . Hearing loss in left ear   . Vertigo   . HYPERCHOLESTEROLEMIA 10/08/2010  . DEPRESSION 10/08/2010  . HYPERTENSION 10/08/2010  . ALLERGIC RHINITIS 10/08/2010    Orientation RESPIRATION BLADDER Height & Weight     Self, Time, Situation, Place  Normal Continent Weight: 79.4 kg Height:  5\' 4"  (162.6 cm)  BEHAVIORAL SYMPTOMS/MOOD NEUROLOGICAL BOWEL NUTRITION STATUS      Continent Diet(Heart Healthy)  AMBULATORY STATUS COMMUNICATION OF NEEDS Skin   Limited Assist Verbally Normal                       Personal Care Assistance Level of Assistance  Bathing, Feeding, Dressing Bathing Assistance: Limited assistance Feeding assistance: Independent Dressing Assistance: Limited assistance     Functional Limitations Info  Sight, Hearing, Speech Sight Info: Adequate Hearing Info: Impaired(HOH) Speech Info: Adequate    SPECIAL CARE FACTORS FREQUENCY  PT (By licensed PT), OT (By licensed OT)     PT Frequency: PT x5 OT Frequency: OT x5            Contractures Contractures Info: Not present    Additional Factors Info  Code Status, Allergies Code Status Info: DNR Allergies Info: No Known Allergies           Current Medications (11/19/2019):  This is the current hospital active medication list Current Facility-Administered Medications  Medication Dose Route Frequency Provider Last Rate Last Admin  . acetaminophen (TYLENOL) tablet 650 mg  650 mg Oral Q6H PRN Maudie Mercury,  [name], MD       Or  . acetaminophen (TYLENOL) suppository 650 mg  650 mg Rectal Q6H PRN Jani Gravel, MD      . apixaban Arne Cleveland) tablet 10 mg  10 mg Oral BID Eugenie Filler, MD   10 mg at 11/19/19 1029   Followed by  . [START ON 11/25/2019] apixaban (ELIQUIS) tablet 5 mg  5 mg Oral BID Eugenie Filler, MD      . atorvastatin (LIPITOR) tablet 10 mg  10 mg Oral Daily Eugenie Filler, MD   10 mg at 11/19/19 1029  . bisacodyl  (DULCOLAX) suppository 10 mg  10 mg Rectal Daily Eugenie Filler, MD   10 mg at 11/19/19 1029  . buPROPion (WELLBUTRIN XL) 24 hr tablet 150 mg  150 mg Oral Daily Eugenie Filler, MD   150 mg at 11/19/19 1029  . docusate sodium (COLACE) capsule 100 mg  100 mg Oral BID Eugenie Filler, MD   100 mg at 11/19/19 1030  . gabapentin (NEURONTIN) capsule 300 mg  300 mg Oral TID Eugenie Filler, MD   300 mg at 11/19/19 1029  . lurasidone (LATUDA) tablet 120 mg  120 mg Oral QHS Eugenie Filler, MD   120 mg at 11/18/19 2109  . meclizine (ANTIVERT) tablet 25 mg  25 mg Oral TID PRN Eugenie Filler, MD      . traMADol Veatrice Bourbon) tablet 50 mg  50 mg Oral Q6H PRN Eugenie Filler, MD   50 mg at 11/19/19 1029  . Vitamin D (Ergocalciferol) (DRISDOL) capsule 50,000 Units  50,000 Units Oral Q7 days Eugenie Filler, MD   50,000 Units at 11/18/19 2919     Discharge Medications: Please see discharge summary for a list of discharge medications.  Relevant Imaging Results:  Relevant Lab Results:   Additional Information [phone]  Purcell Mouton, RN

## 2019-11-19 NOTE — Consult Note (Signed)
   [facility] Inpatient Consult   11/19/2019  Judy [name] 02-Jul-1946 [id number]  Patient chart has been reviewed for readmissions less than 30 days for unplanned readmissions.  Patient assessed for community [place] Management follow up needs. Patient eligible for [facility] CM services under [facility] Medicare insurance plan.  Chart review reveals current PT recommendation is for SNF. No identifiable [facility] Care Management needs.  Netta Cedars, MSN, [facility] Liaison Nurse Mobile Phone [phone]  Toll free office [phone]

## 2019-11-19 NOTE — Progress Notes (Signed)
PROGRESS NOTE    Judy [name]  FVC:[id number] DOB: [birth year] DOA: 11/15/2019 PCP: [name], FNP    Brief Narrative:  Per Dr. Horton Chin Dolder  is a 74 y.o. female,  w  Bipolar do, hypertension, hyperlipidemia, vertigo, obeisity, L4-5 spinal stenosis, no family hx of PE, w recently presents from rehab due to dyspnea, chest pain starting this am. Pt sent for r/o PE  In ED T 98.9, P 64  R 15, Bp 127/83  Pox 98% on RA Wt 78 kg  CTA chest IMPRESSION: 1. Bilateral segmental and subsegmental pulmonary emboli. No CT evidence for right heart strain. 2. Unchanged 7 mm ground-glass nodule within the right upper lobe. Recommend additional follow-up chest CT in 2 years.  3. Aortic Atherosclerosis (ICD10-I70.0).  Lower extremity ultrasound negative for DVT preliminary read  Wbc 8.2, Hgb 15.1, Plt 246 Na 139, K 4.0, Bun 18, Creatinine 0.90 D dimer 5.88  Trop 6  BNP 41.6   Assessment & Plan:   Principal Problem:   Pulmonary embolism (HCC) Active Problems:   Bipolar 1 disorder (HCC)   Hyperlipidemia with target LDL less than 130   Essential hypertension, benign  1 acute bilateral segmental and subsegmental PE Patient had presented with pleuritic chest pain, shortness of breath CT chest noted to have acute bilateral PE with no right ventricular strain.  Lower extremity Dopplers negative for DVT.  2D echo with a EF of 60 to 65%, moderately increased LVH, normal right ventricular systolic function, mildly dilated left atrial size. Patient improving clinically. Cardiac enzymes negative.  Blood pressure stable.  Patient received 72 hours of IV heparin and has been transitioned to Eliquis.  If patient tolerates Eliquis could likely discharge tomorrow.   2.  7 mm lung nodule right upper lung Outpatient follow-up with repeat CT scan in 2 years.  3.  Hypertension Blood pressure was borderline and as such Norvasc discontinued.  Blood pressure improved with systolic blood pressure  of 122/71.  Continue to hold Norvasc for now.  Follow.    4.  Hyperlipidemia Continue statin.   5.  Bipolar disorder Stable.  Continue Wellbutrin, Latuda, Ingrezza.  6.  Severe spinal stenosis L4-L5 Continue gabapentin.  PT/OT.  7.  Chronic compression deformity T12 We need outpatient bone density scan done.  Pain management.  PT/OT.  8.  Vitamin D deficiency Continue vitamin D supplementation as outpatient.  9.  Constipation Patient with good bowel movement noted on 11/16/2019. Continue daily Dulcolax suppositories and Colace.   DVT prophylaxis: Eliquis Code Status: DNR Family Communication: Updated patient.  No family at bedside. Disposition Plan:  . Patient came from: SNF            . Anticipated d/c place: SNF . Barriers to d/c OR conditions which need to be met to effect a safe d/c: Likely back to skilled nursing facility once patient has improved clinically and transitioned from heparin drip to oral anticoagulation hopefully tomorrow.    Consultants:   None  Procedures:   CT angiogram chest 11/15/2019  Chest x-ray 11/15/2019  Lower extremity Dopplers 11/15/2019  2D echo 11/16/2019   Antimicrobials:   None   Subjective: Patient sitting up in bed.  States shortness of breath slowly improving.  Pleuritic chest pain improving.  No bleeding.   Objective: Vitals:   11/18/19 0505 11/18/19 1253 11/18/19 2000 11/19/19 0450  BP: (!) 102/58 (!) 143/74 (!) 117/52 124/61  Pulse: (!) 46 66 (!) 55 (!) 59  Resp: 18 19 20  20  Temp: 98.2 F (36.8 C) 98.9 F (37.2 C) 98.5 F (36.9 C) 98.4 F (36.9 C)  TempSrc:   Oral   SpO2: 99% 99% 97% 98%  Weight:    79.4 kg  Height:        Intake/Output Summary (Last 24 hours) at 11/19/2019 1035 Last data filed at 11/19/2019 0513 Gross per 24 hour  Intake 739.14 ml  Output 1750 ml  Net -1010.86 ml   Filed Weights   11/16/19 0414 11/17/19 0500 11/19/19 0450  Weight: 77.7 kg 80.8 kg 79.4 kg    Examination:  General exam:  NAD Respiratory system: CTAB.  No wheezes, no crackles, no rhonchi.  Normal respiratory effort.  Cardiovascular system: Regular rate and rhythm no murmurs rubs or gallops.  No JVD.  No lower extremity edema. Gastrointestinal system: Abdomen is soft, nontender, nondistended, positive bowel sounds.  No rebound.  No guarding.  Central nervous system: Alert and oriented. No focal neurological deficits. Extremities: Symmetric 5 x 5 power. Skin: No rashes, lesions or ulcers Psychiatry: Judgement and insight appear normal. Mood & affect appropriate.     Data Reviewed: I have personally reviewed following labs and imaging studies  CBC: Recent Labs  Lab 11/15/19 1716 11/16/19 0502 11/17/19 0932 11/18/19 0405  WBC 8.2 11.2* 6.8 6.4  NEUTROABS 7.1  --   --   --   HGB 15.1* 13.5 13.4 12.3  HCT 45.9 40.7 41.4 38.6  MCV 92.2 91.7 94.5 94.1  PLT 246 215 226 290   Basic Metabolic Panel: Recent Labs  Lab 11/15/19 1716 11/16/19 0502 11/17/19 0932 11/18/19 0405 11/19/19 0407  NA 139 142 142 140 138  K 4.0 3.5 4.0 3.7 4.2  CL 103 108 106 106 104  CO2 '29 29 31 26 28  '$ GLUCOSE 137* 95 96 97 94  BUN '18 13 18 22 20  '$ CREATININE 0.90 0.67 0.86 0.95 0.93  CALCIUM 9.3 9.1 9.2 8.9 9.0   GFR: Estimated Creatinine Clearance: 55.8 mL/min (by C-G formula based on SCr of 0.93 mg/dL). Liver Function Tests: Recent Labs  Lab 11/16/19 0502  AST 25  ALT 37  ALKPHOS 55  BILITOT 0.4  PROT 6.0*  ALBUMIN 3.1*   No results for input(s): LIPASE, AMYLASE in the last 168 hours. No results for input(s): AMMONIA in the last 168 hours. Coagulation Profile: No results for input(s): INR, PROTIME in the last 168 hours. Cardiac Enzymes: No results for input(s): CKTOTAL, CKMB, CKMBINDEX, TROPONINI in the last 168 hours. BNP (last 3 results) No results for input(s): PROBNP in the last 8760 hours. HbA1C: No results for input(s): HGBA1C in the last 72 hours. CBG: No results for input(s): GLUCAP in the last  168 hours. Lipid Profile: No results for input(s): CHOL, HDL, LDLCALC, TRIG, CHOLHDL, LDLDIRECT in the last 72 hours. Thyroid Function Tests: No results for input(s): TSH, T4TOTAL, FREET4, T3FREE, THYROIDAB in the last 72 hours. Anemia Panel: No results for input(s): VITAMINB12, FOLATE, FERRITIN, TIBC, IRON, RETICCTPCT in the last 72 hours. Sepsis Labs: No results for input(s): PROCALCITON, LATICACIDVEN in the last 168 hours.  Recent Results (from the past 240 hour(s))  Respiratory Panel by RT PCR (Flu A&B, Covid) - Nasopharyngeal Swab     Status: None   Collection Time: 11/15/19  5:04 PM   Specimen: Nasopharyngeal Swab  Result Value Ref Range Status   SARS Coronavirus 2 by RT PCR NEGATIVE NEGATIVE Final    Comment: (NOTE) SARS-CoV-2 target nucleic acids are NOT DETECTED. The  SARS-CoV-2 RNA is generally detectable in upper respiratoy specimens during the acute phase of infection. The lowest concentration of SARS-CoV-2 viral copies this assay can detect is 131 copies/mL. A negative result does not preclude SARS-Cov-2 infection and should not be used as the sole basis for treatment or other patient management decisions. A negative result may occur with  improper specimen collection/handling, submission of specimen other than nasopharyngeal swab, presence of viral mutation(s) within the areas targeted by this assay, and inadequate number of viral copies (<131 copies/mL). A negative result must be combined with clinical observations, patient history, and epidemiological information. The expected result is Negative. Fact Sheet for Patients:  PinkCheek.be Fact Sheet for Healthcare Providers:  GravelBags.it This test is not yet ap proved or cleared by the [place] FDA and  has been authorized for detection and/or diagnosis of SARS-CoV-2 by FDA under an Emergency Use Authorization (EUA). This EUA will remain  in effect  (meaning this test can be used) for the duration of the COVID-19 declaration under Section 564(b)(1) of the Act, 21 U.S.C. section 360bbb-3(b)(1), unless the authorization is terminated or revoked sooner.    Influenza A by PCR NEGATIVE NEGATIVE Final   Influenza B by PCR NEGATIVE NEGATIVE Final    Comment: (NOTE) The Xpert Xpress SARS-CoV-2/FLU/RSV assay is intended as an aid in  the diagnosis of influenza from Nasopharyngeal swab specimens and  should not be used as a sole basis for treatment. Nasal washings and  aspirates are unacceptable for Xpert Xpress SARS-CoV-2/FLU/RSV  testing. Fact Sheet for Patients: PinkCheek.be Fact Sheet for Healthcare Providers: GravelBags.it This test is not yet approved or cleared by the [place] FDA and  has been authorized for detection and/or diagnosis of SARS-CoV-2 by  FDA under an Emergency Use Authorization (EUA). This EUA will remain  in effect (meaning this test can be used) for the duration of the  Covid-19 declaration under Section 564(b)(1) of the Act, 21  U.S.C. section 360bbb-3(b)(1), unless the authorization is  terminated or revoked. Performed at [facility], [place] [address], [place], [place] [address]          Radiology Studies: No results found.      Scheduled Meds: . apixaban  10 mg Oral BID   Followed by  . [START ON 11/25/2019] apixaban  5 mg Oral BID  . atorvastatin  10 mg Oral Daily  . bisacodyl  10 mg Rectal Daily  . buPROPion  150 mg Oral Daily  . docusate sodium  100 mg Oral BID  . gabapentin  300 mg Oral TID  . lurasidone  120 mg Oral QHS  . Vitamin D (Ergocalciferol)  50,000 Units Oral Q7 days   Continuous Infusions:    LOS: 4 days    Time spent: 40 minutes    Irine Seal, MD [facility]   To contact the attending provider between 7A-7P or the covering provider during after hours 7P-7A, please log into  the web site [email or web address] and access using universal [place] password for that web site. If you do not have the password, please call the hospital operator.  11/19/2019, 10:35 AM

## 2019-11-19 NOTE — Plan of Care (Signed)
  Problem: Education: Goal: Knowledge of General Education information will improve Description Including pain rating scale, medication(s)/side effects and non-pharmacologic comfort measures Outcome: Progressing   

## 2019-11-20 DIAGNOSIS — M2550 Pain in unspecified joint: Secondary | ICD-10-CM | POA: Diagnosis not present

## 2019-11-20 DIAGNOSIS — I10 Essential (primary) hypertension: Secondary | ICD-10-CM | POA: Diagnosis not present

## 2019-11-20 DIAGNOSIS — E7849 Other hyperlipidemia: Secondary | ICD-10-CM | POA: Diagnosis not present

## 2019-11-20 DIAGNOSIS — E785 Hyperlipidemia, unspecified: Secondary | ICD-10-CM | POA: Diagnosis not present

## 2019-11-20 DIAGNOSIS — F319 Bipolar disorder, unspecified: Secondary | ICD-10-CM | POA: Diagnosis not present

## 2019-11-20 DIAGNOSIS — R0989 Other specified symptoms and signs involving the circulatory and respiratory systems: Secondary | ICD-10-CM | POA: Diagnosis not present

## 2019-11-20 DIAGNOSIS — R498 Other voice and resonance disorders: Secondary | ICD-10-CM | POA: Diagnosis not present

## 2019-11-20 DIAGNOSIS — M48062 Spinal stenosis, lumbar region with neurogenic claudication: Secondary | ICD-10-CM | POA: Diagnosis not present

## 2019-11-20 DIAGNOSIS — R911 Solitary pulmonary nodule: Secondary | ICD-10-CM | POA: Diagnosis not present

## 2019-11-20 DIAGNOSIS — U071 COVID-19: Secondary | ICD-10-CM | POA: Diagnosis not present

## 2019-11-20 DIAGNOSIS — Z7401 Bed confinement status: Secondary | ICD-10-CM | POA: Diagnosis not present

## 2019-11-20 DIAGNOSIS — R2689 Other abnormalities of gait and mobility: Secondary | ICD-10-CM | POA: Diagnosis not present

## 2019-11-20 DIAGNOSIS — I2699 Other pulmonary embolism without acute cor pulmonale: Secondary | ICD-10-CM | POA: Diagnosis not present

## 2019-11-20 DIAGNOSIS — R2681 Unsteadiness on feet: Secondary | ICD-10-CM | POA: Diagnosis not present

## 2019-11-20 DIAGNOSIS — Z743 Need for continuous supervision: Secondary | ICD-10-CM | POA: Diagnosis not present

## 2019-11-20 DIAGNOSIS — K5900 Constipation, unspecified: Secondary | ICD-10-CM | POA: Diagnosis not present

## 2019-11-20 DIAGNOSIS — M6281 Muscle weakness (generalized): Secondary | ICD-10-CM | POA: Diagnosis not present

## 2019-11-20 LAB — CBC
HCT: 39.3 % (ref 36.0–46.0)
Hemoglobin: 12.9 g/dL (ref 12.0–15.0)
MCH: 30.9 pg (ref 26.0–34.0)
MCHC: 32.8 g/dL (ref 30.0–36.0)
MCV: 94 fL (ref 80.0–100.0)
Platelets: 236 10*3/uL (ref 150–400)
RBC: 4.18 MIL/uL (ref 3.87–5.11)
RDW: 14.7 % (ref 11.5–15.5)
WBC: 6.8 10*3/uL (ref 4.0–10.5)
nRBC: 0 % (ref 0.0–0.2)

## 2019-11-20 LAB — BASIC METABOLIC PANEL
Anion gap: 9 (ref 5–15)
BUN: 25 mg/dL — ABNORMAL HIGH (ref 8–23)
CO2: 26 mmol/L (ref 22–32)
Calcium: 9.2 mg/dL (ref 8.9–10.3)
Chloride: 103 mmol/L (ref 98–111)
Creatinine, Ser: 0.94 mg/dL (ref 0.44–1.00)
GFR calc Af Amer: >60 mL/min (ref >60)
GFR calc non Af Amer: >60 mL/min (ref >60)
Glucose, Bld: 99 mg/dL (ref 70–99)
Potassium: 4 mmol/L (ref 3.5–5.1)
Sodium: 138 mmol/L (ref 135–145)

## 2019-11-20 MED ORDER — APIXABAN 5 MG PO TABS: 5.0000 mg | ORAL_TABLET | Freq: Two times a day (BID) | ORAL | 2 refills | Status: DC

## 2019-11-20 MED ORDER — DULCOLAX 5 MG PO TBEC: 5.0000 mg | DELAYED_RELEASE_TABLET | Freq: Every day | ORAL | 1 refills | Status: AC | PRN

## 2019-11-20 MED ORDER — TRAMADOL HCL 50 MG PO TABS: 50.0000 mg | ORAL_TABLET | Freq: Four times a day (QID) | ORAL | 0 refills | Status: DC | PRN

## 2019-11-20 NOTE — Care Management Important Message (Signed)
Important Message  Patient Details IM Letter given to Gabriel Earing RN Case Manager to present to the Patient Name: CHANNON BROUGHER MRN: [id number] Date of Birth: September 28, 1946   Medicare Important Message Given:  Yes     Kerin Salen 11/20/2019, 12:10 PM

## 2019-11-20 NOTE — Progress Notes (Signed)
Pt will discharged to [facility], report called, acknowledged understanding of instruction and report. Handoff report given to [place], LPN. EMS dispatched by Case Manager. Pt in bed in stable condition. SRP, RN

## 2019-11-20 NOTE — TOC Progression Note (Signed)
Transition of Care [facility]) - Progression Note    Patient Details  Name: Judy [name] MRN: [id number] Date of Birth: [birth year]  [facility] CM/SW Contact  Purcell Mouton, RN Phone Number: 11/20/2019, 2:15 PM  Clinical Narrative:     [facility] called to transport Pt's sister Judy [name] was called to inform her of pt's discharge back to [facility]/SNF.        Expected Discharge Plan and Services           Expected Discharge Date: 11/20/19                                     Social Determinants of Health (SDOH) Interventions    Readmission Risk Interventions No flowsheet data found.

## 2019-11-20 NOTE — Progress Notes (Signed)
Physical Therapy Treatment Patient Details Name: Judy [name] MRN: [id number] DOB: December 21, 1945 Today's Date: 11/20/2019    History of Present Illness Judy [name]  is a 74 y.o. female,  w  Bipolar do, hypertension, hyperlipidemia, vertigo, obeisity, L4-5 spinal stenosis, no family hx of PE, w recently presents from rehab due to dyspnea, chest pain starting this am. Patient diagnosed with bilateral pulmonary embolism    PT Comments    Assisted OOB to [facility] then to amb.  General bed mobility comments: Increased time with assist with mobilizing R LE toward edge of bed and for trunk support.  General transfer comment: cues for hand placement not to pull on walker.  VERY slow moving.  Also assisted on/off BSC. General Gait Details: cues for posture, position from RW, safety awareness, and increased stride length.  VERY slow sluggish gait with poor forwarf flex posture.  Follow Up Recommendations  SNF     Equipment Recommendations  None recommended by PT    Recommendations for Other Services       Precautions / Restrictions Precautions Precautions: Fall Restrictions Weight Bearing Restrictions: No    Mobility  Bed Mobility Overal bed mobility: Needs Assistance Bed Mobility: Supine to Sit     Supine to sit: Min assist     General bed mobility comments: Increased time with assist with mobilizing R LE toward edge of bed and for trunk support  Transfers Overall transfer level: Needs assistance   Transfers: Sit to/from Stand Sit to Stand: Min assist         General transfer comment: cues for hand placement not to pull on walker.  VERY slow moving.  Also assisted on/off BSC.  Ambulation/Gait Ambulation/Gait assistance: Min assist Gait Distance (Feet): 12 Feet Assistive device: Rolling walker (2 wheeled) Gait Pattern/deviations: Step-to pattern;Step-through pattern;Decreased step length - right;Decreased step length - left;Shuffle;Wide base of support Gait velocity:  decreased   General Gait Details: cues for posture, position from RW, safety awareness, and increased stride length.  VERY slow sluggish gait with poor forwarf flex posture.   Stairs             Wheelchair Mobility    Modified Rankin (Stroke Patients Only)       Balance                                            Cognition Arousal/Alertness: Awake/alert Behavior During Therapy: Flat affect                                   General Comments: slow to respond      Exercises      General Comments        Pertinent Vitals/Pain Pain Assessment: Faces Faces Pain Scale: Hurts a little bit Pain Location: R inner thigh Pain Descriptors / Indicators: Discomfort;Grimacing    Home Living                      Prior Function            PT Goals (current goals can now be found in the care plan section) Progress towards PT goals: Progressing toward goals    Frequency    Min 3X/week      PT Plan Current plan remains appropriate    Co-evaluation  AM-PAC PT "6 Clicks" Mobility   Outcome Measure  Help needed turning from your back to your side while in a flat bed without using bedrails?: A Little Help needed moving from lying on your back to sitting on the side of a flat bed without using bedrails?: A Little Help needed moving to and from a bed to a chair (including a wheelchair)?: A Lot Help needed standing up from a chair using your arms (e.g., wheelchair or bedside chair)?: A Little Help needed to walk in hospital room?: A Lot Help needed climbing 3-5 steps with a railing? : Total 6 Click Score: 14    End of Session Equipment Utilized During Treatment: Gait belt Activity Tolerance: Patient limited by fatigue Patient left: with call bell/phone within reach;in chair;with chair alarm set Nurse Communication: Mobility status PT Visit Diagnosis: Muscle weakness (generalized) (M62.81);Difficulty in  walking, not elsewhere classified (R26.2);Unsteadiness on feet (R26.81)     Time: 1610-9604 PT Time Calculation (min) (ACUTE ONLY): 24 min  Charges:  $Gait Training: 8-22 mins $Therapeutic Activity: 8-22 mins                     Rica Koyanagi  PTA Acute  Rehabilitation Services Pager      [phone] Office      [phone]

## 2019-11-20 NOTE — Plan of Care (Signed)
  Problem: Education: Goal: Knowledge of General Education information will improve Description Including pain rating scale, medication(s)/side effects and non-pharmacologic comfort measures Outcome: Progressing   Problem: Health Behavior/Discharge Planning: Goal: Ability to manage health-related needs will improve Outcome: Progressing   

## 2019-11-20 NOTE — Discharge Summary (Signed)
Physician Discharge Summary  KALANI BARAY FTD:[id number] DOB: [birth year] DOA: 11/15/2019  PCP: [name], FNP  Admit date: 11/15/2019 Discharge date: 11/20/2019  Time spent: 60 minutes  Recommendations for Outpatient Follow-up:  1. Follow-up with MD at skilled nursing facility.  Patient will need a basic metabolic profile as well as a CBC done in 1 week to follow-up on electrolytes, renal function and H&H.   Discharge Diagnoses:  Principal Problem:   Pulmonary embolism ([place]) Active Problems:   Bipolar 1 disorder ([place])   Hyperlipidemia with target LDL less than 130   Essential hypertension, benign   Discharge Condition: Stable and improved  Diet recommendation: Heart healthy  Filed Weights   11/17/19 0500 11/19/19 0450 11/20/19 0423  Weight: 80.8 kg 79.4 kg 78.2 kg    History of present illness:  HPI per Dr. Asencion Partridge  is a 74 y.o. female,  w  Bipolar do, hypertension, hyperlipidemia, vertigo, obeisity, L4-5 spinal stenosis, no family hx of PE, w recently presents from rehab due to dyspnea, chest pain starting this am. Pt sent for r/o PE  In ED T 98.9, P 64  R 15, Bp 127/83  Pox 98% on RA Wt 78 kg  CTA chest IMPRESSION: 1. Bilateral segmental and subsegmental pulmonary emboli. No CT evidence for right heart strain. 2. Unchanged 7 mm ground-glass nodule within the right upper lobe. Recommend additional follow-up chest CT in 2 years.  3. Aortic Atherosclerosis (ICD10-I70.0).  Lower extremity ultrasound negative for DVT preliminary read  Wbc 8.2, Hgb 15.1, Plt 246 Na 139, K 4.0, Bun 18, Creatinine 0.90 D dimer 5.88  Trop 6  BNP 41.6  Pt admitted for Acute PE  Hospital Course:  1 acute bilateral segmental and subsegmental PE Patient had presented with pleuritic chest pain, shortness of breath CT chest noted to have acute bilateral PE with no right ventricular strain.  Lower extremity Dopplers negative for DVT.  2D echo with a EF of 60 to 65%,  moderately increased LVH, normal right ventricular systolic function, mildly dilated left atrial size. Cardiac enzymes negative.  Blood pressure stable.  Patient received 72 hours of IV heparin and subsequently transitioned to Eliquis which he tolerated.  Patient had no bleeding on Eliquis.  Patient improved clinically will be discharged in stable and improved condition.  Outpatient follow-up.  2.  7 mm lung nodule right upper lung Outpatient follow-up with repeat CT scan in 2 years.  3.  Hypertension Blood pressure was borderline and as such Norvasc discontinued during the hospitalization.  Blood pressure improved.  Norvasc will be resumed on discharge.  Outpatient follow-up.  4.  Hyperlipidemia Patient maintained on statin.   5.  Bipolar disorder Remained stable throughout the hospitalization.  Patient maintained on home regimen of Wellbutrin, Latuda, Ingrezza.  6.  Severe spinal stenosis L4-L5 Patient maintained on home regimen of gabapentin.  Patient was seen by PT OT.  Patient will be discharged back to skilled nursing facility.   7.  Chronic compression deformity T12 We need outpatient bone density scan done.  Pain management.  PT/OT.  8.  Vitamin D deficiency Patient was maintained on vitamin D supplementation.  Outpatient follow-up.  9.  Constipation Patient with good bowel movement noted on 11/16/2019.  Patient was maintained on daily Dulcolax suppositories as well as Colace twice daily.  Patient be discharged on prior regimen of Colace twice daily as well as Dulcolax as needed.  Outpatient follow-up.     Procedures:  CT angiogram chest 11/15/2019  Chest x-ray 11/15/2019  Lower extremity Dopplers 11/15/2019  2D echo 11/16/2019   Consultations:  None  Discharge Exam: Vitals:   11/19/19 2032 11/20/19 0423  BP: 118/73 115/61  Pulse: (!) 56 (!) 51  Resp: 20 16  Temp: 99.1 F (37.3 C) 98 F (36.7 C)  SpO2: 99% 100%    General: NAD Cardiovascular:  RRR Respiratory: CTAB  Discharge Instructions   Discharge Instructions    Diet - low sodium heart healthy   Complete by: As directed    Increase activity slowly   Complete by: As directed      Allergies as of 11/20/2019   No Known Allergies     Medication List    STOP taking these medications   predniSONE 50 MG tablet Commonly known as: DELTASONE     TAKE these medications   acetaminophen 325 MG tablet Commonly known as: TYLENOL Take 2 tablets (650 mg total) by mouth every 6 (six) hours as needed for mild pain (or Fever >/= 101).   amLODipine 5 MG tablet Commonly known as: NORVASC TAKE 1 TABLET BY MOUTH EVERY DAY   apixaban 5 MG Tabs tablet Commonly known as: ELIQUIS Take 1-2 tablets (5-10 mg total) by mouth 2 (two) times daily. Take 2 tablets (10mg ) daily 2 times daily through 11/24/2019, then starting 11/25/2019 start taking 1 tablet (5mg ) 2 times daily.   atorvastatin 10 MG tablet Commonly known as: Lipitor Take 1 tablet (10 mg total) by mouth daily.   buPROPion 150 MG 24 hr tablet Commonly known as: WELLBUTRIN XL Take 1 tablet (150 mg total) by mouth daily.   DAILY VITE PO Take 1 tablet by mouth daily.   docusate sodium 100 MG capsule Commonly known as: COLACE Take 100 mg by mouth 2 (two) times daily.   Dulcolax 5 MG EC tablet Generic drug: bisacodyl Take 1 tablet (5 mg total) by mouth daily as needed for moderate constipation.   gabapentin 300 MG capsule Commonly known as: NEURONTIN Take 1 capsule (300 mg total) by mouth 3 (three) times daily.   Ingrezza 40 MG Caps Generic drug: Valbenazine Tosylate Take 40 mg by mouth daily.   Latuda 120 MG Tabs Generic drug: Lurasidone HCl Take 1 tablet (120 mg total) by mouth at bedtime.   meclizine 25 MG tablet Commonly known as: ANTIVERT Take 1 tablet (25 mg total) by mouth 3 (three) times daily as needed for dizziness.   traMADol 50 MG tablet Commonly known as: ULTRAM Take 1 tablet (50 mg total) by  mouth every 6 (six) hours as needed for moderate pain.   Vitamin D (Ergocalciferol) 1.25 MG (50000 UNIT) Caps capsule Commonly known as: DRISDOL TAKE 1 CAPSULE (50,000 UNITS TOTAL) BY MOUTH EVERY 7 (SEVEN) DAYS. What changed: additional instructions      No Known Allergies Follow-up Information    MD AT SNF Follow up.            The results of significant diagnostics from this hospitalization (including imaging, microbiology, ancillary and laboratory) are listed below for reference.    Significant Diagnostic Studies: CT Angio Chest PE W and/or Wo Contrast  Result Date: 11/15/2019 CLINICAL DATA:  Respiratory distress. Evaluate for pulmonary embolus. EXAM: CT ANGIOGRAPHY CHEST WITH CONTRAST TECHNIQUE: Multidetector CT imaging of the chest was performed using the standard protocol during bolus administration of intravenous contrast. Multiplanar CT image reconstructions and MIPs were obtained to evaluate the vascular anatomy. CONTRAST:  144mL OMNIPAQUE IOHEXOL 350 MG/ML SOLN COMPARISON:  Chest CT 08/04/2018  FINDINGS: Cardiovascular: Heart is mildly enlarged. Trace fluid superior pericardial recess. Thoracic aortic vascular calcifications. Aorta and main pulmonary artery normal in caliber. Small filling defects are demonstrated within the segmental right lower lobe pulmonary arteries (image 123; series 5). Additionally there is a small filling defect within the left lower lobe segmental pulmonary artery (image 162; series 5). Additionally small emboli demonstrated within the right upper lobe subsegmental pulmonary arteries (image 80; series 5). No CT evidence to suggest right heart strain.23 Mediastinum/Nodes: No enlarged axillary, mediastinal or hilar lymphadenopathy. Normal appearance of the esophagus. Lungs/Pleura: Central airways are patent. No large area pulmonary consolidation. No pleural effusion pneumothorax. Unchanged 7 mm ground-glass nodule within the right upper lobe (image 48; series  6). No pleural effusion or pneumothorax. Upper Abdomen: Unremarkable. Musculoskeletal: No aggressive or acute appearing osseous lesions. Unchanged chronic appearing T12 compression deformity. Review of the MIP images confirms the above findings. IMPRESSION: 1. Bilateral segmental and subsegmental pulmonary emboli. No CT evidence for right heart strain. 2. Unchanged 7 mm ground-glass nodule within the right upper lobe. Recommend additional follow-up chest CT in 2 years. 3. Aortic Atherosclerosis (ICD10-I70.0). 4. Critical Value/emergent results were called by telephone at the time of interpretation on 11/15/2019 at 8:24 pm to provider [facility] , who verbally acknowledged these results. Electronically Signed   By: Lovey Newcomer M.D.   On: 11/15/2019 20:27   DG Chest Portable 1 View  Result Date: 11/15/2019 CLINICAL DATA:  Dyspnea. EXAM: PORTABLE CHEST 1 VIEW COMPARISON:  November 06, 2019 FINDINGS: The heart size and mediastinal contours are within normal limits. Both lungs are clear. Tortuosity of the descending thoracic aorta is seen. The visualized skeletal structures are unremarkable. IMPRESSION: Stable exam without evidence of acute or active cardiopulmonary disease. Electronically Signed   By: Virgina Norfolk M.D.   On: 11/15/2019 17:07   DG Chest Port 1 View  Result Date: 11/06/2019 CLINICAL DATA:  Generalized weakness. EXAM: PORTABLE CHEST 1 VIEW COMPARISON:  June 14, 2019 FINDINGS: There is no evidence of acute infiltrate, pleural effusion or pneumothorax. The heart size and mediastinal contours are within normal limits. There is tortuosity of the descending thoracic aorta. The visualized skeletal structures are unremarkable. IMPRESSION: No active disease. Electronically Signed   By: Virgina Norfolk M.D.   On: 11/06/2019 17:55   ECHOCARDIOGRAM COMPLETE  Result Date: 11/16/2019   ECHOCARDIOGRAM REPORT   Patient Name:   NOELLA KIPNIS Date of Exam: 11/16/2019 Medical Rec #:  [id number]       Height:        64.0 in Accession #:    [id number]      Weight:       171.3 lb Date of Birth:  [birth year]       BSA:          1.83 m Patient Age:    89 years        BP:           135/77 mmHg Patient Gender: F               HR:           61 bpm. Exam Location:  Inpatient Procedure: 2D Echo, Cardiac Doppler and Color Doppler Indications:    I26.02 Pulmonary embolus  History:        Patient has prior history of Echocardiogram examinations, most                 recent 10/18/2016. Risk Factors:Hypertension and Dyslipidemia.  Sonographer:    Jonelle Sidle Dance Referring Phys: Jane  1. Left ventricular ejection fraction, by visual estimation, is 60 to 65%. The left ventricle has normal function. There is moderately increased left ventricular hypertrophy.  2. Left ventricular diastolic parameters are indeterminate.  3. Global right ventricle has normal systolic function.The right ventricular size is normal.  4. Left atrial size was mildly dilated.  5. Right atrial size was normal.  6. The mitral valve is normal in structure. No evidence of mitral valve regurgitation.  7. The tricuspid valve is normal in structure. Tricuspid valve regurgitation is trivial.  8. The aortic valve is tricuspid. Aortic valve regurgitation is not visualized. No evidence of aortic valve stenosis.  9. The pulmonic valve was not well visualized. Pulmonic valve regurgitation is not visualized. 10. Normal pulmonary artery systolic pressure. FINDINGS  Left Ventricle: Left ventricular ejection fraction, by visual estimation, is 60 to 65%. The left ventricle has normal function. The left ventricle has no regional wall motion abnormalities. There is moderately increased left ventricular hypertrophy. Left ventricular diastolic parameters are indeterminate. Right Ventricle: The right ventricular size is normal. No increase in right ventricular wall thickness. Global RV systolic function is has normal systolic function. The tricuspid regurgitant velocity  is 2.08 m/s, and with an assumed right atrial pressure  of 3 mmHg, the estimated right ventricular systolic pressure is normal at 20.3 mmHg. Left Atrium: Left atrial size was mildly dilated. Right Atrium: Right atrial size was normal in size Pericardium: Trivial pericardial effusion is present. Mitral Valve: The mitral valve is normal in structure. No evidence of mitral valve regurgitation. Tricuspid Valve: The tricuspid valve is normal in structure. Tricuspid valve regurgitation is trivial. Aortic Valve: The aortic valve is tricuspid. Aortic valve regurgitation is not visualized. The aortic valve is structurally normal, with no evidence of sclerosis or stenosis. Pulmonic Valve: The pulmonic valve was not well visualized. Pulmonic valve regurgitation is not visualized. Pulmonic regurgitation is not visualized. Aorta: The aortic root and ascending aorta are structurally normal, with no evidence of dilitation. IAS/Shunts: The interatrial septum was not well visualized.  LEFT VENTRICLE PLAX 2D LVIDd:         4.20 cm  Diastology LVIDs:         2.50 cm  LV e' lateral:   8.81 cm/s LV PW:         1.40 cm  LV E/e' lateral: 7.7 LV IVS:        1.20 cm  LV e' medial:    6.20 cm/s LVOT diam:     2.10 cm  LV E/e' medial:  10.9 LV SV:         56 ml LV SV Index:   29.65 LVOT Area:     3.46 cm  RIGHT VENTRICLE          IVC RV Basal diam:  2.20 cm  IVC diam: 2.00 cm TAPSE (M-mode): 2.4 cm LEFT ATRIUM             Index       RIGHT ATRIUM           Index LA diam:        5.20 cm 2.84 cm/m  RA Area:     13.80 cm LA Vol (A2C):   62.6 ml 34.18 ml/m RA Volume:   31.20 ml  17.03 ml/m LA Vol (A4C):   60.4 ml 32.97 ml/m LA Biplane Vol: 64.5 ml 35.21 ml/m  AORTIC VALVE LVOT Vmax:  109.00 cm/s LVOT Vmean:  65.500 cm/s LVOT VTI:    0.266 m  AORTA Ao Root diam: 3.40 cm Ao Asc diam:  3.00 cm MITRAL VALVE                        TRICUSPID VALVE MV Area (PHT): 2.06 cm             TR Peak grad:   17.3 mmHg MV PHT:        107.01 msec           TR Vmax:        208.00 cm/s MV Decel Time: 369 msec MV E velocity: 67.80 cm/s 103 cm/s  SHUNTS MV A velocity: 90.30 cm/s 70.3 cm/s Systemic VTI:  0.27 m MV E/A ratio:  0.75       1.5       Systemic Diam: 2.10 cm  Oswaldo Milian MD Electronically signed by Oswaldo Milian MD Signature Date/Time: 11/16/2019/7:25:02 PM    Final    VAS [place] LOWER EXTREMITY VENOUS (DVT) (ONLY [facility] & [facility] 7a-7p)  Result Date: 11/15/2019  Lower Venous DVTStudy Indications: Edema, and Swelling.  Comparison Study: 06/28/17 previous Performing Technologist: Abram Sander RVS  Examination Guidelines: A complete evaluation includes B-mode imaging, spectral Doppler, color Doppler, and power Doppler as needed of all accessible portions of each vessel. Bilateral testing is considered an integral part of a complete examination. Limited examinations for reoccurring indications may be performed as noted. The reflux portion of the exam is performed with the patient in reverse Trendelenburg.  +---------+---------------+---------+-----------+----------+--------------+ RIGHT    CompressibilityPhasicitySpontaneityPropertiesThrombus Aging +---------+---------------+---------+-----------+----------+--------------+ CFV      Full           Yes      Yes                                 +---------+---------------+---------+-----------+----------+--------------+ SFJ      Full                                                        +---------+---------------+---------+-----------+----------+--------------+ FV Prox  Full                                                        +---------+---------------+---------+-----------+----------+--------------+ FV Mid   Full                                                        +---------+---------------+---------+-----------+----------+--------------+ FV DistalFull                                                         +---------+---------------+---------+-----------+----------+--------------+ PFV      Full                                                        +---------+---------------+---------+-----------+----------+--------------+  POP      Full           Yes      Yes                                 +---------+---------------+---------+-----------+----------+--------------+ PTV      Full                                                        +---------+---------------+---------+-----------+----------+--------------+ PERO     Full                                                        +---------+---------------+---------+-----------+----------+--------------+   +----+---------------+---------+-----------+----------+--------------+ LEFTCompressibilityPhasicitySpontaneityPropertiesThrombus Aging +----+---------------+---------+-----------+----------+--------------+ CFV Full           Yes      Yes                                 +----+---------------+---------+-----------+----------+--------------+     Summary: RIGHT: - There is no evidence of deep vein thrombosis in the lower extremity.  - No cystic structure found in the popliteal fossa.  LEFT: - No evidence of common femoral vein obstruction.  *See table(s) above for measurements and observations.    Preliminary     Microbiology: Recent Results (from the past 240 hour(s))  Respiratory Panel by RT PCR (Flu A&B, Covid) - Nasopharyngeal Swab     Status: None   Collection Time: 11/15/19  5:04 PM   Specimen: Nasopharyngeal Swab  Result Value Ref Range Status   SARS Coronavirus 2 by RT PCR NEGATIVE NEGATIVE Final    Comment: (NOTE) SARS-CoV-2 target nucleic acids are NOT DETECTED. The SARS-CoV-2 RNA is generally detectable in upper respiratoy specimens during the acute phase of infection. The lowest concentration of SARS-CoV-2 viral copies this assay can detect is 131 copies/mL. A negative result does not preclude  SARS-Cov-2 infection and should not be used as the sole basis for treatment or other patient management decisions. A negative result may occur with  improper specimen collection/handling, submission of specimen other than nasopharyngeal swab, presence of viral mutation(s) within the areas targeted by this assay, and inadequate number of viral copies (<131 copies/mL). A negative result must be combined with clinical observations, patient history, and epidemiological information. The expected result is Negative. Fact Sheet for Patients:  PinkCheek.be Fact Sheet for Healthcare Providers:  GravelBags.it This test is not yet ap proved or cleared by the [place] FDA and  has been authorized for detection and/or diagnosis of SARS-CoV-2 by FDA under an Emergency Use Authorization (EUA). This EUA will remain  in effect (meaning this test can be used) for the duration of the COVID-19 declaration under Section 564(b)(1) of the Act, 21 U.S.C. section 360bbb-3(b)(1), unless the authorization is terminated or revoked sooner.    Influenza A by PCR NEGATIVE NEGATIVE Final   Influenza B by PCR NEGATIVE NEGATIVE Final    Comment: (NOTE) The Xpert Xpress SARS-CoV-2/FLU/RSV assay is intended as an aid in  the diagnosis of influenza from Nasopharyngeal swab  specimens and  should not be used as a sole basis for treatment. Nasal washings and  aspirates are unacceptable for Xpert Xpress SARS-CoV-2/FLU/RSV  testing. Fact Sheet for Patients: PinkCheek.be Fact Sheet for Healthcare Providers: GravelBags.it This test is not yet approved or cleared by the [place] FDA and  has been authorized for detection and/or diagnosis of SARS-CoV-2 by  FDA under an Emergency Use Authorization (EUA). This EUA will remain  in effect (meaning this test can be used) for the duration of the  Covid-19  declaration under Section 564(b)(1) of the Act, 21  U.S.C. section 360bbb-3(b)(1), unless the authorization is  terminated or revoked. Performed at [facility], [place] [address], [place], [place] [address]      Labs: Basic Metabolic Panel: Recent Labs  Lab 11/16/19 0502 11/17/19 0932 11/18/19 0405 11/19/19 0407 11/20/19 0314  NA 142 142 140 138 138  K 3.5 4.0 3.7 4.2 4.0  CL 108 106 106 104 103  CO2 29 31 26 28 26   GLUCOSE 95 96 97 94 99  BUN 13 18 22 20  25*  CREATININE 0.67 0.86 0.95 0.93 0.94  CALCIUM 9.1 9.2 8.9 9.0 9.2   Liver Function Tests: Recent Labs  Lab 11/16/19 0502  AST 25  ALT 37  ALKPHOS 55  BILITOT 0.4  PROT 6.0*  ALBUMIN 3.1*   No results for input(s): LIPASE, AMYLASE in the last 168 hours. No results for input(s): AMMONIA in the last 168 hours. CBC: Recent Labs  Lab 11/15/19 1716 11/16/19 0502 11/17/19 0932 11/18/19 0405 11/20/19 0314  WBC 8.2 11.2* 6.8 6.4 6.8  NEUTROABS 7.1  --   --   --   --   HGB 15.1* 13.5 13.4 12.3 12.9  HCT 45.9 40.7 41.4 38.6 39.3  MCV 92.2 91.7 94.5 94.1 94.0  PLT 246 215 226 229 236   Cardiac Enzymes: No results for input(s): CKTOTAL, CKMB, CKMBINDEX, TROPONINI in the last 168 hours. BNP: BNP (last 3 results) Recent Labs    11/15/19 1716  BNP 41.6    ProBNP (last 3 results) No results for input(s): PROBNP in the last 8760 hours.  CBG: No results for input(s): GLUCAP in the last 168 hours.     Signed:  Irine Seal MD.  [facility] 11/20/2019, 1:09 PM

## 2019-11-21 DIAGNOSIS — I10 Essential (primary) hypertension: Secondary | ICD-10-CM | POA: Diagnosis not present

## 2019-11-21 DIAGNOSIS — E7849 Other hyperlipidemia: Secondary | ICD-10-CM | POA: Diagnosis not present

## 2019-11-21 DIAGNOSIS — I2699 Other pulmonary embolism without acute cor pulmonale: Secondary | ICD-10-CM | POA: Diagnosis not present

## 2019-11-21 DIAGNOSIS — M48062 Spinal stenosis, lumbar region with neurogenic claudication: Secondary | ICD-10-CM | POA: Diagnosis not present

## 2019-11-22 DIAGNOSIS — E785 Hyperlipidemia, unspecified: Secondary | ICD-10-CM | POA: Diagnosis not present

## 2019-11-22 DIAGNOSIS — I2699 Other pulmonary embolism without acute cor pulmonale: Secondary | ICD-10-CM | POA: Diagnosis not present

## 2019-11-22 DIAGNOSIS — R911 Solitary pulmonary nodule: Secondary | ICD-10-CM | POA: Diagnosis not present

## 2019-11-22 DIAGNOSIS — R0989 Other specified symptoms and signs involving the circulatory and respiratory systems: Secondary | ICD-10-CM | POA: Diagnosis not present

## 2019-11-29 ENCOUNTER — Telehealth: Payer: Self-pay

## 2019-11-29 NOTE — Telephone Encounter (Signed)
Prior authorization submitted for INGREZZA 40 mg capsules #30 through [employer]/Medicare ID# [id number] approved through 10/10/2020, PA# [id number]  Submitted through cover my meds

## 2019-12-10 ENCOUNTER — Telehealth: Payer: Self-pay | Admitting: Physician Assistant

## 2019-12-10 DIAGNOSIS — E7849 Other hyperlipidemia: Secondary | ICD-10-CM | POA: Diagnosis not present

## 2019-12-10 DIAGNOSIS — I10 Essential (primary) hypertension: Secondary | ICD-10-CM | POA: Diagnosis not present

## 2019-12-10 DIAGNOSIS — M4800 Spinal stenosis, site unspecified: Secondary | ICD-10-CM | POA: Diagnosis not present

## 2019-12-10 DIAGNOSIS — R911 Solitary pulmonary nodule: Secondary | ICD-10-CM | POA: Diagnosis not present

## 2019-12-10 DIAGNOSIS — I2699 Other pulmonary embolism without acute cor pulmonale: Secondary | ICD-10-CM | POA: Diagnosis not present

## 2019-12-10 NOTE — Telephone Encounter (Signed)
Tried to reach patient with information, no answer and her mail box is full. Will try back later.

## 2019-12-10 NOTE — Telephone Encounter (Signed)
Pt called and said that she has been in the hospital. She is needing a refill on her Anette Guarneri, but the insurance company is not paying. Pt said that her insurance is the same.

## 2019-12-10 NOTE — Telephone Encounter (Signed)
Judy [name] is on her formulary, her medication is being paid by insurance but her co-pay is 107.00. Will notify patient with information.

## 2019-12-11 DIAGNOSIS — M6281 Muscle weakness (generalized): Secondary | ICD-10-CM | POA: Diagnosis not present

## 2019-12-11 DIAGNOSIS — R0602 Shortness of breath: Secondary | ICD-10-CM | POA: Diagnosis not present

## 2019-12-11 DIAGNOSIS — I2699 Other pulmonary embolism without acute cor pulmonale: Secondary | ICD-10-CM | POA: Diagnosis not present

## 2019-12-11 DIAGNOSIS — R2681 Unsteadiness on feet: Secondary | ICD-10-CM | POA: Diagnosis not present

## 2019-12-12 DIAGNOSIS — M4316 Spondylolisthesis, lumbar region: Secondary | ICD-10-CM | POA: Diagnosis not present

## 2019-12-12 DIAGNOSIS — K5901 Slow transit constipation: Secondary | ICD-10-CM

## 2019-12-12 DIAGNOSIS — H9192 Unspecified hearing loss, left ear: Secondary | ICD-10-CM

## 2019-12-12 DIAGNOSIS — I2693 Single subsegmental pulmonary embolism without acute cor pulmonale: Secondary | ICD-10-CM | POA: Diagnosis not present

## 2019-12-12 DIAGNOSIS — F319 Bipolar disorder, unspecified: Secondary | ICD-10-CM

## 2019-12-12 DIAGNOSIS — M48061 Spinal stenosis, lumbar region without neurogenic claudication: Secondary | ICD-10-CM | POA: Diagnosis not present

## 2019-12-12 DIAGNOSIS — E785 Hyperlipidemia, unspecified: Secondary | ICD-10-CM

## 2019-12-12 DIAGNOSIS — I10 Essential (primary) hypertension: Secondary | ICD-10-CM

## 2019-12-12 DIAGNOSIS — F419 Anxiety disorder, unspecified: Secondary | ICD-10-CM

## 2019-12-12 DIAGNOSIS — M170 Bilateral primary osteoarthritis of knee: Secondary | ICD-10-CM | POA: Diagnosis not present

## 2019-12-12 DIAGNOSIS — M8938 Hypertrophy of bone, other site: Secondary | ICD-10-CM | POA: Diagnosis not present

## 2019-12-12 DIAGNOSIS — D509 Iron deficiency anemia, unspecified: Secondary | ICD-10-CM

## 2019-12-12 NOTE — Telephone Encounter (Signed)
Tried to reach patient to discuss Latuda, no answer and her voicemail is full. Will try to reach later

## 2019-12-13 DIAGNOSIS — M8938 Hypertrophy of bone, other site: Secondary | ICD-10-CM | POA: Diagnosis not present

## 2019-12-13 DIAGNOSIS — M48061 Spinal stenosis, lumbar region without neurogenic claudication: Secondary | ICD-10-CM | POA: Diagnosis not present

## 2019-12-13 DIAGNOSIS — I2693 Single subsegmental pulmonary embolism without acute cor pulmonale: Secondary | ICD-10-CM | POA: Diagnosis not present

## 2019-12-13 DIAGNOSIS — M170 Bilateral primary osteoarthritis of knee: Secondary | ICD-10-CM | POA: Diagnosis not present

## 2019-12-13 DIAGNOSIS — M4316 Spondylolisthesis, lumbar region: Secondary | ICD-10-CM | POA: Diagnosis not present

## 2019-12-14 DIAGNOSIS — M48061 Spinal stenosis, lumbar region without neurogenic claudication: Secondary | ICD-10-CM | POA: Diagnosis not present

## 2019-12-14 DIAGNOSIS — M170 Bilateral primary osteoarthritis of knee: Secondary | ICD-10-CM | POA: Diagnosis not present

## 2019-12-14 DIAGNOSIS — M4316 Spondylolisthesis, lumbar region: Secondary | ICD-10-CM | POA: Diagnosis not present

## 2019-12-14 DIAGNOSIS — M8938 Hypertrophy of bone, other site: Secondary | ICD-10-CM | POA: Diagnosis not present

## 2019-12-14 DIAGNOSIS — I2693 Single subsegmental pulmonary embolism without acute cor pulmonale: Secondary | ICD-10-CM | POA: Diagnosis not present

## 2019-12-17 DIAGNOSIS — M4316 Spondylolisthesis, lumbar region: Secondary | ICD-10-CM | POA: Diagnosis not present

## 2019-12-17 DIAGNOSIS — M8938 Hypertrophy of bone, other site: Secondary | ICD-10-CM | POA: Diagnosis not present

## 2019-12-17 DIAGNOSIS — M170 Bilateral primary osteoarthritis of knee: Secondary | ICD-10-CM | POA: Diagnosis not present

## 2019-12-17 DIAGNOSIS — M48061 Spinal stenosis, lumbar region without neurogenic claudication: Secondary | ICD-10-CM | POA: Diagnosis not present

## 2019-12-17 DIAGNOSIS — I2693 Single subsegmental pulmonary embolism without acute cor pulmonale: Secondary | ICD-10-CM | POA: Diagnosis not present

## 2019-12-18 ENCOUNTER — Telehealth: Payer: Self-pay

## 2019-12-18 DIAGNOSIS — M170 Bilateral primary osteoarthritis of knee: Secondary | ICD-10-CM | POA: Diagnosis not present

## 2019-12-18 DIAGNOSIS — M8938 Hypertrophy of bone, other site: Secondary | ICD-10-CM | POA: Diagnosis not present

## 2019-12-18 DIAGNOSIS — I2693 Single subsegmental pulmonary embolism without acute cor pulmonale: Secondary | ICD-10-CM | POA: Diagnosis not present

## 2019-12-18 DIAGNOSIS — M4316 Spondylolisthesis, lumbar region: Secondary | ICD-10-CM | POA: Diagnosis not present

## 2019-12-18 DIAGNOSIS — M48061 Spinal stenosis, lumbar region without neurogenic claudication: Secondary | ICD-10-CM | POA: Diagnosis not present

## 2019-12-18 NOTE — Telephone Encounter (Signed)
I called patient to do a tcm call and schedule her a virtual hospital f/u. I have left pt v/m to call the office. YRL,RMA

## 2019-12-20 ENCOUNTER — Ambulatory Visit: Payer: Medicare Other | Admitting: Physician Assistant

## 2019-12-20 ENCOUNTER — Telehealth: Payer: Self-pay

## 2019-12-20 DIAGNOSIS — M48061 Spinal stenosis, lumbar region without neurogenic claudication: Secondary | ICD-10-CM | POA: Diagnosis not present

## 2019-12-20 DIAGNOSIS — M170 Bilateral primary osteoarthritis of knee: Secondary | ICD-10-CM | POA: Diagnosis not present

## 2019-12-20 DIAGNOSIS — I2693 Single subsegmental pulmonary embolism without acute cor pulmonale: Secondary | ICD-10-CM | POA: Diagnosis not present

## 2019-12-20 DIAGNOSIS — M8938 Hypertrophy of bone, other site: Secondary | ICD-10-CM | POA: Diagnosis not present

## 2019-12-20 DIAGNOSIS — M4316 Spondylolisthesis, lumbar region: Secondary | ICD-10-CM | POA: Diagnosis not present

## 2019-12-25 DIAGNOSIS — M170 Bilateral primary osteoarthritis of knee: Secondary | ICD-10-CM | POA: Diagnosis not present

## 2019-12-25 DIAGNOSIS — M8938 Hypertrophy of bone, other site: Secondary | ICD-10-CM | POA: Diagnosis not present

## 2019-12-25 DIAGNOSIS — M4316 Spondylolisthesis, lumbar region: Secondary | ICD-10-CM | POA: Diagnosis not present

## 2019-12-25 DIAGNOSIS — M48061 Spinal stenosis, lumbar region without neurogenic claudication: Secondary | ICD-10-CM | POA: Diagnosis not present

## 2019-12-25 DIAGNOSIS — I2693 Single subsegmental pulmonary embolism without acute cor pulmonale: Secondary | ICD-10-CM | POA: Diagnosis not present

## 2019-12-26 DIAGNOSIS — M170 Bilateral primary osteoarthritis of knee: Secondary | ICD-10-CM | POA: Diagnosis not present

## 2019-12-26 DIAGNOSIS — M4316 Spondylolisthesis, lumbar region: Secondary | ICD-10-CM | POA: Diagnosis not present

## 2019-12-26 DIAGNOSIS — I2693 Single subsegmental pulmonary embolism without acute cor pulmonale: Secondary | ICD-10-CM | POA: Diagnosis not present

## 2019-12-26 DIAGNOSIS — M8938 Hypertrophy of bone, other site: Secondary | ICD-10-CM | POA: Diagnosis not present

## 2019-12-26 DIAGNOSIS — M48061 Spinal stenosis, lumbar region without neurogenic claudication: Secondary | ICD-10-CM | POA: Diagnosis not present

## 2019-12-27 DIAGNOSIS — M8938 Hypertrophy of bone, other site: Secondary | ICD-10-CM | POA: Diagnosis not present

## 2019-12-27 DIAGNOSIS — M170 Bilateral primary osteoarthritis of knee: Secondary | ICD-10-CM | POA: Diagnosis not present

## 2019-12-27 DIAGNOSIS — I2693 Single subsegmental pulmonary embolism without acute cor pulmonale: Secondary | ICD-10-CM | POA: Diagnosis not present

## 2019-12-27 DIAGNOSIS — M4316 Spondylolisthesis, lumbar region: Secondary | ICD-10-CM | POA: Diagnosis not present

## 2019-12-27 DIAGNOSIS — M48061 Spinal stenosis, lumbar region without neurogenic claudication: Secondary | ICD-10-CM | POA: Diagnosis not present

## 2019-12-28 DIAGNOSIS — I2693 Single subsegmental pulmonary embolism without acute cor pulmonale: Secondary | ICD-10-CM | POA: Diagnosis not present

## 2019-12-28 DIAGNOSIS — M4316 Spondylolisthesis, lumbar region: Secondary | ICD-10-CM | POA: Diagnosis not present

## 2019-12-28 DIAGNOSIS — M8938 Hypertrophy of bone, other site: Secondary | ICD-10-CM | POA: Diagnosis not present

## 2019-12-28 DIAGNOSIS — M48061 Spinal stenosis, lumbar region without neurogenic claudication: Secondary | ICD-10-CM | POA: Diagnosis not present

## 2019-12-28 DIAGNOSIS — M170 Bilateral primary osteoarthritis of knee: Secondary | ICD-10-CM | POA: Diagnosis not present

## 2019-12-31 DIAGNOSIS — M170 Bilateral primary osteoarthritis of knee: Secondary | ICD-10-CM | POA: Diagnosis not present

## 2019-12-31 DIAGNOSIS — I2693 Single subsegmental pulmonary embolism without acute cor pulmonale: Secondary | ICD-10-CM | POA: Diagnosis not present

## 2019-12-31 DIAGNOSIS — M48061 Spinal stenosis, lumbar region without neurogenic claudication: Secondary | ICD-10-CM | POA: Diagnosis not present

## 2019-12-31 DIAGNOSIS — M8938 Hypertrophy of bone, other site: Secondary | ICD-10-CM | POA: Diagnosis not present

## 2019-12-31 DIAGNOSIS — M4316 Spondylolisthesis, lumbar region: Secondary | ICD-10-CM | POA: Diagnosis not present

## 2020-01-01 DIAGNOSIS — M4316 Spondylolisthesis, lumbar region: Secondary | ICD-10-CM | POA: Diagnosis not present

## 2020-01-01 DIAGNOSIS — M170 Bilateral primary osteoarthritis of knee: Secondary | ICD-10-CM | POA: Diagnosis not present

## 2020-01-01 DIAGNOSIS — M48061 Spinal stenosis, lumbar region without neurogenic claudication: Secondary | ICD-10-CM | POA: Diagnosis not present

## 2020-01-01 DIAGNOSIS — I2693 Single subsegmental pulmonary embolism without acute cor pulmonale: Secondary | ICD-10-CM | POA: Diagnosis not present

## 2020-01-01 DIAGNOSIS — M8938 Hypertrophy of bone, other site: Secondary | ICD-10-CM | POA: Diagnosis not present

## 2020-01-02 DIAGNOSIS — M170 Bilateral primary osteoarthritis of knee: Secondary | ICD-10-CM | POA: Diagnosis not present

## 2020-01-02 DIAGNOSIS — M8938 Hypertrophy of bone, other site: Secondary | ICD-10-CM | POA: Diagnosis not present

## 2020-01-02 DIAGNOSIS — I2693 Single subsegmental pulmonary embolism without acute cor pulmonale: Secondary | ICD-10-CM | POA: Diagnosis not present

## 2020-01-02 DIAGNOSIS — M48061 Spinal stenosis, lumbar region without neurogenic claudication: Secondary | ICD-10-CM | POA: Diagnosis not present

## 2020-01-02 DIAGNOSIS — M4316 Spondylolisthesis, lumbar region: Secondary | ICD-10-CM | POA: Diagnosis not present

## 2020-01-04 DIAGNOSIS — M170 Bilateral primary osteoarthritis of knee: Secondary | ICD-10-CM | POA: Diagnosis not present

## 2020-01-04 DIAGNOSIS — M8938 Hypertrophy of bone, other site: Secondary | ICD-10-CM | POA: Diagnosis not present

## 2020-01-04 DIAGNOSIS — M48061 Spinal stenosis, lumbar region without neurogenic claudication: Secondary | ICD-10-CM | POA: Diagnosis not present

## 2020-01-04 DIAGNOSIS — M4316 Spondylolisthesis, lumbar region: Secondary | ICD-10-CM | POA: Diagnosis not present

## 2020-01-04 DIAGNOSIS — I2693 Single subsegmental pulmonary embolism without acute cor pulmonale: Secondary | ICD-10-CM | POA: Diagnosis not present

## 2020-01-07 DIAGNOSIS — M48061 Spinal stenosis, lumbar region without neurogenic claudication: Secondary | ICD-10-CM | POA: Diagnosis not present

## 2020-01-07 DIAGNOSIS — M170 Bilateral primary osteoarthritis of knee: Secondary | ICD-10-CM | POA: Diagnosis not present

## 2020-01-07 DIAGNOSIS — I2693 Single subsegmental pulmonary embolism without acute cor pulmonale: Secondary | ICD-10-CM | POA: Diagnosis not present

## 2020-01-07 DIAGNOSIS — M8938 Hypertrophy of bone, other site: Secondary | ICD-10-CM | POA: Diagnosis not present

## 2020-01-07 DIAGNOSIS — M4316 Spondylolisthesis, lumbar region: Secondary | ICD-10-CM | POA: Diagnosis not present

## 2020-01-08 DIAGNOSIS — M48061 Spinal stenosis, lumbar region without neurogenic claudication: Secondary | ICD-10-CM | POA: Diagnosis not present

## 2020-01-08 DIAGNOSIS — M8938 Hypertrophy of bone, other site: Secondary | ICD-10-CM | POA: Diagnosis not present

## 2020-01-08 DIAGNOSIS — M170 Bilateral primary osteoarthritis of knee: Secondary | ICD-10-CM | POA: Diagnosis not present

## 2020-01-08 DIAGNOSIS — M4316 Spondylolisthesis, lumbar region: Secondary | ICD-10-CM | POA: Diagnosis not present

## 2020-01-08 DIAGNOSIS — I2693 Single subsegmental pulmonary embolism without acute cor pulmonale: Secondary | ICD-10-CM | POA: Diagnosis not present

## 2020-01-15 DIAGNOSIS — M48061 Spinal stenosis, lumbar region without neurogenic claudication: Secondary | ICD-10-CM | POA: Diagnosis not present

## 2020-01-15 DIAGNOSIS — M4316 Spondylolisthesis, lumbar region: Secondary | ICD-10-CM | POA: Diagnosis not present

## 2020-01-15 DIAGNOSIS — I2693 Single subsegmental pulmonary embolism without acute cor pulmonale: Secondary | ICD-10-CM | POA: Diagnosis not present

## 2020-01-15 DIAGNOSIS — M170 Bilateral primary osteoarthritis of knee: Secondary | ICD-10-CM | POA: Diagnosis not present

## 2020-01-15 DIAGNOSIS — M8938 Hypertrophy of bone, other site: Secondary | ICD-10-CM | POA: Diagnosis not present

## 2020-01-20 ENCOUNTER — Other Ambulatory Visit: Payer: Self-pay | Admitting: Physician Assistant

## 2020-01-21 NOTE — Telephone Encounter (Signed)
Last apt 11/25, was due back 4-6 weeks

## 2020-01-22 ENCOUNTER — Other Ambulatory Visit: Payer: Self-pay

## 2020-01-22 DIAGNOSIS — M170 Bilateral primary osteoarthritis of knee: Secondary | ICD-10-CM | POA: Diagnosis not present

## 2020-01-22 DIAGNOSIS — M4316 Spondylolisthesis, lumbar region: Secondary | ICD-10-CM | POA: Diagnosis not present

## 2020-01-22 DIAGNOSIS — M8938 Hypertrophy of bone, other site: Secondary | ICD-10-CM | POA: Diagnosis not present

## 2020-01-22 DIAGNOSIS — I2693 Single subsegmental pulmonary embolism without acute cor pulmonale: Secondary | ICD-10-CM | POA: Diagnosis not present

## 2020-01-22 DIAGNOSIS — M48061 Spinal stenosis, lumbar region without neurogenic claudication: Secondary | ICD-10-CM | POA: Diagnosis not present

## 2020-01-30 ENCOUNTER — Other Ambulatory Visit: Payer: Self-pay

## 2020-01-30 DIAGNOSIS — I10 Essential (primary) hypertension: Secondary | ICD-10-CM

## 2020-01-30 DIAGNOSIS — E782 Mixed hyperlipidemia: Secondary | ICD-10-CM

## 2020-01-30 MED ORDER — VITAMIN D (ERGOCALCIFEROL) 1.25 MG (50000 UNIT) PO CAPS: 50000.0000 [IU] | ORAL_CAPSULE | ORAL | 0 refills | Status: DC

## 2020-01-30 MED ORDER — ATORVASTATIN CALCIUM 10 MG PO TABS: 10.0000 mg | ORAL_TABLET | Freq: Every day | ORAL | 3 refills | Status: DC

## 2020-01-30 MED ORDER — AMLODIPINE BESYLATE 5 MG PO TABS: 5.0000 mg | ORAL_TABLET | Freq: Every day | ORAL | 1 refills | Status: DC

## 2020-02-01 ENCOUNTER — Other Ambulatory Visit: Payer: Self-pay

## 2020-02-06 ENCOUNTER — Other Ambulatory Visit: Payer: Self-pay

## 2020-02-06 MED ORDER — GABAPENTIN 300 MG PO CAPS: 300.0000 mg | ORAL_CAPSULE | Freq: Three times a day (TID) | ORAL | 0 refills | Status: DC

## 2020-02-06 MED ORDER — BUPROPION HCL ER (XL) 150 MG PO TB24: 150.0000 mg | ORAL_TABLET | Freq: Every day | ORAL | 0 refills | Status: DC

## 2020-02-19 ENCOUNTER — Ambulatory Visit: Payer: Medicare Other | Admitting: Nurse Practitioner

## 2020-03-11 ENCOUNTER — Telehealth: Payer: Self-pay

## 2020-04-07 ENCOUNTER — Telehealth: Payer: Self-pay

## 2020-04-09 ENCOUNTER — Other Ambulatory Visit: Payer: Self-pay | Admitting: Nurse Practitioner

## 2020-04-09 DIAGNOSIS — I10 Essential (primary) hypertension: Secondary | ICD-10-CM

## 2020-05-03 ENCOUNTER — Other Ambulatory Visit: Payer: Self-pay | Admitting: Physician Assistant

## 2020-05-20 ENCOUNTER — Other Ambulatory Visit: Payer: Self-pay | Admitting: Physician Assistant

## 2020-05-26 ENCOUNTER — Other Ambulatory Visit: Payer: Self-pay

## 2020-05-26 ENCOUNTER — Telehealth: Payer: Medicare Other

## 2020-05-26 ENCOUNTER — Ambulatory Visit (INDEPENDENT_AMBULATORY_CARE_PROVIDER_SITE_OTHER): Payer: Medicare Other

## 2020-05-26 DIAGNOSIS — E876 Hypokalemia: Secondary | ICD-10-CM

## 2020-05-26 DIAGNOSIS — G2401 Drug induced subacute dyskinesia: Secondary | ICD-10-CM

## 2020-05-26 DIAGNOSIS — E782 Mixed hyperlipidemia: Secondary | ICD-10-CM

## 2020-05-26 DIAGNOSIS — I10 Essential (primary) hypertension: Secondary | ICD-10-CM

## 2020-05-29 NOTE — Chronic Care Management (AMB) (Signed)
Chronic Care Management   Follow Up Note   05/26/2020 Name: Judy [name] MRN: [id number] DOB: 12-Oct-1945  Referred by: [name], FNP Reason for referral : Chronic Care Management (FU RN CM Call )   Judy [name] is a 74 y.o. year old female who is a primary care patient of [name], [place]. The CCM team was consulted for assistance with chronic disease management and care coordination needs.    Review of patient status, including review of consultants reports, relevant laboratory and other test results, and collaboration with appropriate care team members and the patient's provider was performed as part of comprehensive patient evaluation and provision of chronic care management services.    SDOH (Social Determinants of Health) assessments performed: Yes - see care plan  See Care Plan activities for detailed interventions related to SDOH)   Placed CCM RN CM outbound call to patient for a care plan update.     Outpatient Encounter Medications as of 05/26/2020  Medication Sig  . acetaminophen (TYLENOL) 325 MG tablet Take 2 tablets (650 mg total) by mouth every 6 (six) hours as needed for mild pain (or Fever >/= 101).  [name] amLODipine (NORVASC) 5 MG tablet Take 1 tablet (5 mg total) by mouth daily. PATIENT NEEDS AN APPOINTMENT  . apixaban (ELIQUIS) 5 MG TABS tablet Take 1-2 tablets (5-10 mg total) by mouth 2 (two) times daily. Take 2 tablets (34m) daily 2 times daily through 11/24/2019, then starting 11/25/2019 start taking 1 tablet (558m 2 times daily.  . Marland Kitchentorvastatin (LIPITOR) 10 MG tablet Take 1 tablet (10 mg total) by mouth daily.  . bisacodyl (DULCOLAX) 5 MG EC tablet Take 1 tablet (5 mg total) by mouth daily as needed for moderate constipation.  . Marland KitchenuPROPion (WELLBUTRIN XL) 150 MG 24 hr tablet Take 1 tablet (150 mg total) by mouth daily.  . Marland Kitchenocusate sodium (COLACE) 100 MG capsule Take 100 mg by mouth 2 (two) times daily.  . Marland Kitchenabapentin (NEURONTIN) 300 MG capsule Take 1 capsule  (300 mg total) by mouth 3 (three) times daily.  . Lurasidone HCl (LATUDA) 120 MG TABS Take 1 tablet (120 mg total) by mouth at bedtime.  . meclizine (ANTIVERT) 25 MG tablet Take 1 tablet (25 mg total) by mouth 3 (three) times daily as needed for dizziness.  . Multiple Vitamin (DAILY VITE PO) Take 1 tablet by mouth daily.  . traMADol (ULTRAM) 50 MG tablet Take 1 tablet (50 mg total) by mouth every 6 (six) hours as needed for moderate pain.  . Minus Libertyosylate (INGREZZA) 40 MG CAPS Take 40 mg by mouth daily.  . Vitamin D, Ergocalciferol, (DRISDOL) 1.25 MG (50000 UNIT) CAPS capsule Take 1 capsule (50,000 Units total) by mouth every 7 (seven) days.   No facility-administered encounter medications on file as of 05/26/2020.     Objective:  Lab Results  Component Value Date   HGBA1C 5.6 07/10/2018   HGBA1C 5.7 (H) 03/25/2017   HGBA1C 5.6 07/20/2016   Lab Results  Component Value Date   LDLCALC 143 (H) 10/30/2019   CREATININE 0.94 11/20/2019   BP Readings from Last 3 Encounters:  11/20/19 (!) 119/57  11/08/19 121/72  10/24/19 (!) 147/68    Goals Addressed      Patient Stated   .  "I am not taking anything for my cholesterol" (pt-stated)        Current Barriers:  . Marland Kitchennowledge Deficits related to diagnosis and treatment of Hyperlipidemia . Chronic Disease Management support and education  needs related to HTN, Mixed Hyperlipidemia, Hypokalemia, Tardive dyskinesia  Nurse Case Manager Clinical Goal(s):  [name] Over the next 30 days, patient will work with PCP and embedded Pharm D to address needs related to pharmacological management of Hyperlipidemia Goal Met . 05/26/20 New Over the next 90 days, patient will verbalize basic understanding of Hyperlipidemia disease process and self health management plan as evidenced by patient will lower her total Cholesterol and LDL target range   CCM RN CM Interventions:  05/26/20 call completed with patient . Evaluation of current treatment plan  related to Hyperlipidemia and patient's adherence to plan as established by provider . Reviewed and discussed patient's total Cholesterol obtained on 10/30/19 was 240, LDL 143; Educated on target ranges; Educated on dietary and exercise recommendations . Determined patient is adhering with her medication regimen for treatment of Hyperlipidemia; Determined patient is taking Atorvastatin 10 mg qd started on 01/30/20; Educated on importance of taking this medication exactly as prescribed w/o missed doses and to implement diet and exercise changes as discussed  . Discussed plans with patient for ongoing care management follow up and provided patient with direct contact information for care management team  Patient Self Care Activities:  . Self administers medications as prescribed . Attends all scheduled provider appointments . Calls pharmacy for medication refills . Performs ADL's independently . Calls provider office for new concerns or questions . Unable to perform IADLs independently  Please see past updates related to this goal by clicking on the "Past Updates" button in the selected goal      .  "I need help getting physical therapy" (pt-stated)        CARE PLAN ENTRY (see longitudinal plan of care for additional care plan information)  Current Barriers:  [name] Knowledge Deficits related to disease process and Self Health management of Tardive Dyskinesia  . Chronic Disease Management support and education needs related to HTN, Mixed Hyperlipidemia, Hypokalemia, Tardive dyskinesia  Nurse Case Manager Clinical Goal(s):  [name] Over the next 90 days, patient will work with the CCM team and PCP to address needs related to evaluation and treatment of impaired physical mobility and the inability to perform ADL's/iADL's  CCM RN CM Interventions:  05/26/20 call completed with patient  . Inter-disciplinary care team collaboration (see longitudinal plan of care) . Evaluation of current treatment plan  related to Tardive Dyskensia and patient's adherence to plan as established by provider . Determined Judy [name] continues to have impaired physical mobility and near falls; Determined she feels she needs PT/OT and a home safety evaluation  . Determined Judy [name] was denied for Medicaid but continues to need help with light housekeeping  . Collaborated with PCP [name], FNP regarding patient's need for PT/OT/HSE . Collaborated with embedded BSW Kendra Humble regarding patient's resource needs to assist with light housekeeping  . Discussed plans with patient for ongoing care management follow up and provided patient with direct contact information for care management team  Patient Self Care Activities:  . Self administers medications as prescribed . Attends all scheduled provider appointments . Calls pharmacy for medication refills . Calls provider office for new concerns or questions  Initial goal documentation     .  "I need some grab bars in my bathroom" (pt-stated)   Not on track     Current Barriers:  . Home Safety Concerns . Impaired Physical Mobility secondary to Tardive Dyskinesia . Self Care Deficit  Nurse Case Manager Clinical Goal(s):  [name] Over the next 30  days, patient will work with the CCM team to address needs related to home safety concerns  Goal not met, re-established 10/08/2019  CCM SW Interventions Completed 10/25/2019 . Communication received from the patients primary care provider reporting difficulty identifying a skilled home health need . Collaboration with RN Case Manager who will follow up with the patient to conduct a clinical assessment  Patient Self Care Activities:  . Attends all scheduled provider appointments . Calls pharmacy for medication refills . Performs ADL's independently . Calls provider office for new concerns or questions . Unable to perform IADLs independently  Please see past updates related to this goal by clicking on the "Past  Updates" button in the selected goal      .  "I need someone to help me take a shower" (pt-stated)   Not on track     Current Barriers:  . Financial constraints related to cost of private caregiver . ADL IADL limitations . Limited access to caregiver  Social Work Clinical Goal(s):  [name] Over the next 30 days, patient will follow up with [place] as directed by SW  CCM SW Interventions: Completed 10/25/2019 . Outbound call placed to the patient to assess progression of patient goal . Patient reports she has yet to follow up with DSS to complete a Medicaid application . Assessed for current barriers contributing to patients inability to progress through goal o Patient stated "I don't know" . Collaboration with RN Case Manager to discuss patients inability to progress  o Discussed opportunity of requesting home health orders, if appropriate, for SW to assist with application in the patients home   Patient Self Care Activities:  . Patient verbalizes understanding of plan to contact DSS to complete Medicaid application via phone . Self administers medications as prescribed . Calls provider office for new concerns or questions  Please see past updates related to this goal by clicking on the "Past Updates" button in the selected goal        Plan:   Telephone follow up appointment with care management team member scheduled for: 06/05/20  Barb Merino, RN, BSN, CCM Care Management Coordinator [place] [facility]  Direct Phone: [phone]

## 2020-05-29 NOTE — Patient Instructions (Signed)
Visit Information  Goals Addressed              This Visit's Progress     Patient Stated     "I am not taking anything for my cholesterol" (pt-stated)        Current Barriers:   Knowledge Deficits related to diagnosis and treatment of Hyperlipidemia  Chronic Disease Management support and education needs related to HTN, Mixed Hyperlipidemia, Hypokalemia, Tardive dyskinesia  Nurse Case Manager Clinical Goal(s):   Over the next 30 days, patient will work with PCP and embedded Pharm D to address needs related to pharmacological management of Hyperlipidemia Goal Met  05/26/20 New Over the next 90 days, patient will verbalize basic understanding of Hyperlipidemia disease process and self health management plan as evidenced by patient will lower her total Cholesterol and LDL target range   CCM RN CM Interventions:  05/26/20 call completed with patient  Evaluation of current treatment plan related to Hyperlipidemia and patient's adherence to plan as established by provider  Reviewed and discussed patient's total Cholesterol obtained on 10/30/19 was 240, LDL 143; Educated on target ranges; Educated on dietary and exercise recommendations  Determined patient is adhering with her medication regimen for treatment of Hyperlipidemia; Determined patient is taking Atorvastatin 10 mg qd started on 01/30/20; Educated on importance of taking this medication exactly as prescribed w/o missed doses and to implement diet and exercise changes as discussed   Discussed plans with patient for ongoing care management follow up and provided patient with direct contact information for care management team  Patient Self Care Activities:   Self administers medications as prescribed  Attends all scheduled provider appointments  Calls pharmacy for medication refills  Performs ADL's independently  Calls provider office for new concerns or questions  Unable to perform IADLs independently  Please see  past updates related to this goal by clicking on the "Past Updates" button in the selected goal        "I need help getting physical therapy" (pt-stated)        CARE PLAN ENTRY (see longitudinal plan of care for additional care plan information)  Current Barriers:   Knowledge Deficits related to disease process and Self Health management of Tardive Dyskinesia   Chronic Disease Management support and education needs related to HTN, Mixed Hyperlipidemia, Hypokalemia, Tardive dyskinesia  Nurse Case Manager Clinical Goal(s):   Over the next 90 days, patient will work with the CCM team and PCP to address needs related to evaluation and treatment of impaired physical mobility and the inability to perform ADL's/iADL's  CCM RN CM Interventions:  05/26/20 call completed with patient   Inter-disciplinary care team collaboration (see longitudinal plan of care)  Evaluation of current treatment plan related to Tardive Dyskensia and patient's adherence to plan as established by provider  Determined Ms. Pau continues to have impaired physical mobility and near falls; Determined she feels she needs PT/OT and a home safety evaluation   Determined Ms. Hautala was denied for Medicaid but continues to need help with light housekeeping   Collaborated with PCP [name], FNP regarding patient's need for PT/OT/HSE  Collaborated with embedded BSW Daneen Schick regarding patient's resource needs to assist with light housekeeping   Discussed plans with patient for ongoing care management follow up and provided patient with direct contact information for care management team  Patient Self Care Activities:   Self administers medications as prescribed  Attends all scheduled provider appointments  Calls pharmacy for medication refills  Calls  provider office for new concerns or questions  Initial goal documentation       "I need some grab bars in my bathroom" (pt-stated)   Not on track      Current Barriers:   Home Safety Concerns  Impaired Physical Mobility secondary to Tardive Dyskinesia  Self Care Deficit  Nurse Case Manager Clinical Goal(s):   Over the next 30 days, patient will work with the CCM team to address needs related to home safety concerns  Goal not met, re-established 10/08/2019  CCM SW Interventions Completed 10/25/2019  Communication received from the patients primary care provider reporting difficulty identifying a skilled home health need  Collaboration with RN Case Manager who will follow up with the patient to conduct a clinical assessment  Patient Self Care Activities:   Attends all scheduled provider appointments  Calls pharmacy for medication refills  Performs ADL's independently  Calls provider office for new concerns or questions  Unable to perform IADLs independently  Please see past updates related to this goal by clicking on the "Past Updates" button in the selected goal        "I need someone to help me take a shower" (pt-stated)   Not on track     Current Barriers:   Financial constraints related to cost of private caregiver  ADL IADL limitations  Limited access to caregiver  Social Work Clinical Goal(s):   Over the next 30 days, patient will follow up with [place] as directed by SW  CCM SW Interventions: Completed 10/25/2019  Outbound call placed to the patient to assess progression of patient goal  Patient reports she has yet to follow up with DSS to complete a Medicaid application  Assessed for current barriers contributing to patients inability to progress through goal o Patient stated "I don't know"  Collaboration with RN Case Manager to discuss patients inability to progress  o Discussed opportunity of requesting home health orders, if appropriate, for SW to assist with application in the patients home   Patient Self Care Activities:   Patient verbalizes understanding of plan to contact DSS to  complete Medicaid application via phone  Self administers medications as prescribed  Calls provider office for new concerns or questions  Please see past updates related to this goal by clicking on the "Past Updates" button in the selected goal        Patient verbalizes understanding of instructions provided today.   Telephone follow up appointment with care management team member scheduled for: 06/05/20  Barb Merino, RN, BSN, CCM Care Management Coordinator [facility] [facility]  Direct Phone: [phone]

## 2020-05-30 ENCOUNTER — Ambulatory Visit: Payer: Medicare Other

## 2020-05-30 DIAGNOSIS — I10 Essential (primary) hypertension: Secondary | ICD-10-CM

## 2020-05-30 DIAGNOSIS — G2401 Drug induced subacute dyskinesia: Secondary | ICD-10-CM

## 2020-05-30 DIAGNOSIS — E782 Mixed hyperlipidemia: Secondary | ICD-10-CM

## 2020-05-30 NOTE — Patient Instructions (Signed)
Social Worker Visit Information  Goals we discussed today:  Goals Addressed              This Visit's Progress     Patient Stated   .  "I need help getting physical therapy" (pt-stated)        CARE PLAN ENTRY (see longitudinal plan of care for additional care plan information)  Current Barriers:  [name] Knowledge Deficits related to disease process and Self Health management of Tardive Dyskinesia  . Chronic Disease Management support and education needs related to HTN, Mixed Hyperlipidemia, Hypokalemia, Tardive dyskinesia  Nurse Case Manager Clinical Goal(s):  [name] Over the next 90 days, patient will work with the CCM team and PCP to address needs related to evaluation and treatment of impaired physical mobility and the inability to perform ADL's/iADL's  CCM RN CM Interventions:  05/26/20 call completed with patient  . Inter-disciplinary care team collaboration (see longitudinal plan of care) . Evaluation of current treatment plan related to Tardive Dyskensia and patient's adherence to plan as established by provider . Determined Ms. Steinhaus continues to have impaired physical mobility and near falls; Determined she feels she needs PT/OT and a home safety evaluation  . Determined Ms. Scheunemann was denied for Medicaid but continues to need help with light housekeeping  . Collaborated with PCP Arnette Felts, FNP regarding patient's need for PT/OT/HSE . Collaborated with embedded BSW Glenda Spelman regarding patient's resource needs to assist with light housekeeping  . Discussed plans with patient for ongoing care management follow up and provided patient with direct contact information for care management team  CCM SW Interventions: Completed 05/30/20 . Collaboration with RN Care Manager who reports patient was denied Medicaid and is in need of caregiver resources . Successful outbound call placed to the patient to discuss resources available to the patient . Provided education on [facility] o Patient declined referral . Provided education on [facility] in home aide program o Patient declined referral . Discussed barriers to caregiver resources due to patients reluctance to accept referrals and inability to afford private duty care . Provided the patient with verbal and written information on the [facility] [place] "[employer]" o Advised the patient to contact this resource to determine if alternative resources are available to meet the patient need  Patient Self Care Activities:  . Self administers medications as prescribed . Attends all scheduled provider appointments . Calls pharmacy for medication refills . Calls provider office for new concerns or questions  Please see past updates related to this goal by clicking on the "Past Updates" button in the selected goal      .  COMPLETED: "I need some grab bars in my bathroom" (pt-stated)        Current Barriers:  . Home Safety Concerns . Impaired Physical Mobility secondary to Tardive Dyskinesia . Self Care Deficit  Nurse Case Manager Clinical Goal(s):  [name] Over the next 30 days, patient will work with the CCM team to address needs related to home safety concerns  Goal not met, re-established 10/08/2019  CCM SW Interventions Completed 05/30/20 . Collaboration with RN Care Manager who reports she has contacted the patient primary care provider requesting a Home Safety Evaluation.  . See alternative goal  Completed 10/25/2019 . Communication received from the patients primary care provider reporting difficulty identifying a skilled home health need . Collaboration with RN Case Manager who will follow up with the patient to conduct a clinical assessment  Patient  Self Care Activities:  . Attends all scheduled provider appointments . Calls pharmacy for medication refills . Performs ADL's independently . Calls provider office for new concerns or questions . Unable to perform IADLs  independently  Please see past updates related to this goal by clicking on the "Past Updates" button in the selected goal      .  COMPLETED: "I need someone to help me take a shower" (pt-stated)        Current Barriers:  . Financial constraints related to cost of private caregiver . ADL IADL limitations . Limited access to caregiver  Social Work Clinical Goal(s):  [name] Over the next 30 days, patient will follow up with [place] as directed by SW  CCM SW Interventions: Completed 05/30/20 . Collaboration with RN Care Manager who reports patient was denied Medicaid and is in need of alternative caregiver resources . Successful outbound call placed to the patient . Discussed opportunity to refer the patient to [facility] o Patient declined . Educated the patient on the [facility] in home aide program o Patient declined referral . Advised the patient there are limited resources to assist with care assistance without paying out of pocket for assistance o Patient reports inability to pay for services . Provided the patient with verbal and written information on the [facility] [place] "[employer]"  o Advised the patient to contact this program to complete a screening call and see if alternative resources are available . Goal closed due to patient declining all resources SW can offer  Completed 10/25/2019 . Outbound call placed to the patient to assess progression of patient goal . Patient reports she has yet to follow up with DSS to complete a Medicaid application . Assessed for current barriers contributing to patients inability to progress through goal o Patient stated "I don't know" . Collaboration with RN Case Manager to discuss patients inability to progress  o Discussed opportunity of requesting home health orders, if appropriate, for SW to assist with application in the patients home   Patient Self Care Activities:  . Patient verbalizes understanding  of plan to contact DSS to complete Medicaid application via phone . Self administers medications as prescribed . Calls provider office for new concerns or questions  Please see past updates related to this goal by clicking on the "Past Updates" button in the selected goal        Other   .  COMPLETED: Collaborate witn RN Case Manager to perform appropriate assessments to determined care management and care coordination needs        Current Barriers:  [name] Knowledge barriers related to the independent self-health management of chronic conditions including HTN  Social Work Clinical Goal(s):  [name] Over the next 30 days the patient will work with RN Case Manager to establish an individualized plan of care related to the management of patients identified chronic conditions Chart reviewed 05/30/20- patient is active with RN Care Manager- goal met  CCM SW Interventions: . Patient interviewed and appropriate assessments performed . Assessed understanding of current chronic medical conditions. The patient identifies as having HTN . Determined the patient does not monitor BP readings within the home statin "I just take a pill everyday" . Assessed for patient difficulty affording medications - The patient identifies no difficulty . Collaboration with RN Case Manager regarding patient enrollment in program  Patient Self Care Activities:  . Self administers medications as prescribed . Attends all scheduled provider appointments .  Calls provider office for new concerns or questions  Please see past updates related to this goal by clicking on the "Past Updates" button in the selected goal          Materials Provided: Yes: Provided verbal and written education surrounding caregiver resources.  Follow Up Plan: No SW follow up planned at this time. The patient will remain active with RN Care Manager.   Daneen Schick, BSW, CDP Social Worker, Certified Dementia Practitioner [place] [facility] [place]  [facility] [phone]

## 2020-05-30 NOTE — Chronic Care Management (AMB) (Signed)
Chronic Care Management    Social Work Follow Up Note  05/30/2020 Name: Judy [name] MRN: [id number] DOB: 20-Sep-1946  Judy [name] is a 74 y.o. year old female who is a primary care patient of [name], [place]. The CCM team was consulted for assistance with care coordination.   Review of patient status, including review of consultants reports, other relevant assessments, and collaboration with appropriate care team members and the patient's provider was performed as part of comprehensive patient evaluation and provision of chronic care management services.    SDOH (Social Determinants of Health) assessments performed: No    Outpatient Encounter Medications as of 05/30/2020  Medication Sig   acetaminophen (TYLENOL) 325 MG tablet Take 2 tablets (650 mg total) by mouth every 6 (six) hours as needed for mild pain (or Fever >/= 101).   amLODipine (NORVASC) 5 MG tablet Take 1 tablet (5 mg total) by mouth daily. PATIENT NEEDS AN APPOINTMENT   apixaban (ELIQUIS) 5 MG TABS tablet Take 1-2 tablets (5-10 mg total) by mouth 2 (two) times daily. Take 2 tablets ($RemoveBe'10mg'iamqtQBCY$ ) daily 2 times daily through 11/24/2019, then starting 11/25/2019 start taking 1 tablet ($RemoveB'5mg'LsyEdJSI$ ) 2 times daily.   atorvastatin (LIPITOR) 10 MG tablet Take 1 tablet (10 mg total) by mouth daily.   bisacodyl (DULCOLAX) 5 MG EC tablet Take 1 tablet (5 mg total) by mouth daily as needed for moderate constipation.   buPROPion (WELLBUTRIN XL) 150 MG 24 hr tablet Take 1 tablet (150 mg total) by mouth daily.   docusate sodium (COLACE) 100 MG capsule Take 100 mg by mouth 2 (two) times daily.   gabapentin (NEURONTIN) 300 MG capsule Take 1 capsule (300 mg total) by mouth 3 (three) times daily.   Lurasidone HCl (LATUDA) 120 MG TABS Take 1 tablet (120 mg total) by mouth at bedtime.   meclizine (ANTIVERT) 25 MG tablet Take 1 tablet (25 mg total) by mouth 3 (three) times daily as needed for dizziness.   Multiple Vitamin (DAILY VITE PO) Take 1  tablet by mouth daily.   traMADol (ULTRAM) 50 MG tablet Take 1 tablet (50 mg total) by mouth every 6 (six) hours as needed for moderate pain.   Valbenazine Tosylate (INGREZZA) 40 MG CAPS Take 40 mg by mouth daily.   Vitamin D, Ergocalciferol, (DRISDOL) 1.25 MG (50000 UNIT) CAPS capsule Take 1 capsule (50,000 Units total) by mouth every 7 (seven) days.   No facility-administered encounter medications on file as of 05/30/2020.     Goals Addressed              This Visit's Progress     Patient Stated     "I need help getting physical therapy" (pt-stated)        CARE PLAN ENTRY (see longitudinal plan of care for additional care plan information)  Current Barriers:   Knowledge Deficits related to disease process and Self Health management of Tardive Dyskinesia   Chronic Disease Management support and education needs related to HTN, Mixed Hyperlipidemia, Hypokalemia, Tardive dyskinesia  Nurse Case Manager Clinical Goal(s):   Over the next 90 days, patient will work with the CCM team and PCP to address needs related to evaluation and treatment of impaired physical mobility and the inability to perform ADL's/iADL's  CCM RN CM Interventions:  05/26/20 call completed with patient   Inter-disciplinary care team collaboration (see longitudinal plan of care)  Evaluation of current treatment plan related to Tardive Dyskensia and patient's adherence to plan as established by provider  Determined [name] continues to have impaired physical mobility and near falls; Determined she feels she needs PT/OT and a home safety evaluation   Determined Judy [name] was denied for Medicaid but continues to need help with light housekeeping   Collaborated with PCP [name], FNP regarding patient's need for PT/OT/HSE  Collaborated with embedded BSW Daneen Schick regarding patient's resource needs to assist with light housekeeping   Discussed plans with patient for ongoing care management  follow up and provided patient with direct contact information for care management team  CCM SW Interventions: Completed 05/30/20  Collaboration with [place] who reports patient was denied Medicaid and is in need of caregiver resources  Successful outbound call placed to the patient to discuss resources available to the patient  Provided education on [facility] o Patient declined referral  Provided education on [facility] in home aide program o Patient declined referral  Discussed barriers to caregiver resources due to patients reluctance to accept referrals and inability to afford private duty care  Provided the patient with verbal and written information on the [employer] [facility] [place] "[employer]" o Advised the patient to contact this resource to determine if alternative resources are available to meet the patient need  Patient Self Care Activities:   Self administers medications as prescribed  Attends all scheduled provider appointments  Calls pharmacy for medication refills  Calls provider office for new concerns or questions  Please see past updates related to this goal by clicking on the "Past Updates" button in the selected goal        COMPLETED: "I need some grab bars in my bathroom" (pt-stated)        Current Barriers:   Home Safety Concerns  Impaired Physical Mobility secondary to Tardive Dyskinesia  Self Care Deficit  Nurse Case Manager Clinical Goal(s):   Over the next 30 days, patient will work with the CCM team to address needs related to home safety concerns  Goal not met, re-established 10/08/2019  CCM SW Interventions Completed 05/30/20  Collaboration with RN Care Manager who reports she has contacted the patient primary care provider requesting a Home Safety Evaluation.   See alternative goal  Completed 10/25/2019  Communication received from the patients primary care provider reporting difficulty identifying a skilled  home health need  Collaboration with RN Case Manager who will follow up with the patient to conduct a clinical assessment  Patient Self Care Activities:   Attends all scheduled provider appointments  Calls pharmacy for medication refills  Performs ADL's independently  Calls provider office for new concerns or questions  Unable to perform IADLs independently  Please see past updates related to this goal by clicking on the "Past Updates" button in the selected goal        COMPLETED: "I need someone to help me take a shower" (pt-stated)        Current Barriers:   Financial constraints related to cost of private caregiver  ADL IADL limitations  Limited access to caregiver  Social Work Clinical Goal(s):   Over the next 30 days, patient will follow up with [place] as directed by SW  CCM SW Interventions: Completed 05/30/20  Collaboration with RN Care Manager who reports patient was denied Medicaid and is in need of alternative caregiver resources  Successful outbound call placed to the patient  Discussed opportunity to refer the patient to [facility] o Patient declined  Educated the patient on the [place] in home aide program o Patient declined referral  Advised the patient there are limited resources to assist with care assistance without paying out of pocket for assistance o Patient reports inability to pay for services  Provided the patient with verbal and written information on the [employer] [facility] [place] "[employer]"  o Advised the patient to contact this program to complete a screening call and see if alternative resources are available  Goal closed due to patient declining all resources SW can offer  Completed 10/25/2019  Outbound call placed to the patient to assess progression of patient goal  Patient reports she has yet to follow up with DSS to complete a Medicaid application  Assessed for current barriers contributing to  patients inability to progress through goal o Patient stated "I don't know"  Collaboration with RN Case Manager to discuss patients inability to progress  o Discussed opportunity of requesting home health orders, if appropriate, for SW to assist with application in the patients home   Patient Self Care Activities:   Patient verbalizes understanding of plan to contact DSS to complete Medicaid application via phone  Self administers medications as prescribed  Calls provider office for new concerns or questions  Please see past updates related to this goal by clicking on the "Past Updates" button in the selected goal        Other     COMPLETED: Collaborate witn RN Case Manager to perform appropriate assessments to determined care management and care coordination needs        Current Barriers:   Knowledge barriers related to the independent self-health management of chronic conditions including HTN  Social Work Clinical Goal(s):   Over the next 30 days the patient will work with Chief Strategy Officer to establish an individualized plan of care related to the management of patients identified chronic conditions Chart reviewed 05/30/20- patient is active with RN Care Manager- goal met  CCM SW Interventions:  Patient interviewed and appropriate assessments performed  Assessed understanding of current chronic medical conditions. The patient identifies as having HTN  Determined the patient does not monitor BP readings within the home statin "I just take a pill everyday"  Assessed for patient difficulty affording medications - The patient identifies no difficulty  Collaboration with RN Case Manager regarding patient enrollment in program  Patient Self Care Activities:   Self administers medications as prescribed  Attends all scheduled provider appointments  Calls provider office for new concerns or questions  Please see past updates related to this goal by clicking on the "Past  Updates" button in the selected goal          Follow Up Plan: No SW follow up planned at this time. Adequate resource information has been provided to the patient; the patient continues to decline referrals for assistance. The patient will remain active with RN Care Manager to address disease management needs.   Daneen Schick, BSW, CDP Social Worker, Certified Dementia Practitioner [place] [facility] [place] [facility] [phone]  Total time spent performing care coordination and/or care management activities with the patient by phone or face to face =  20 minutes.

## 2020-06-05 ENCOUNTER — Telehealth: Payer: Medicare Other

## 2020-06-11 ENCOUNTER — Other Ambulatory Visit: Payer: Self-pay

## 2020-06-11 ENCOUNTER — Ambulatory Visit: Payer: Self-pay

## 2020-06-11 ENCOUNTER — Telehealth: Payer: Self-pay

## 2020-06-11 ENCOUNTER — Telehealth: Payer: Medicare Other

## 2020-06-11 DIAGNOSIS — G2401 Drug induced subacute dyskinesia: Secondary | ICD-10-CM

## 2020-06-11 DIAGNOSIS — E782 Mixed hyperlipidemia: Secondary | ICD-10-CM

## 2020-06-11 DIAGNOSIS — I10 Essential (primary) hypertension: Secondary | ICD-10-CM

## 2020-06-11 DIAGNOSIS — E876 Hypokalemia: Secondary | ICD-10-CM

## 2020-06-11 NOTE — Telephone Encounter (Cosign Needed)
  Chronic Care Management   Outreach Note  06/11/2020 Name: Judy [name] MRN: [id number] DOB: 07-03-1946  Referred by: [name], FNP Reason for referral : Chronic Care Management (FU RN CM Call )   An unsuccessful telephone outreach was attempted today. The patient was referred to the case management team for assistance with care management and care coordination.   Follow Up Plan: A HIPPA compliant phone message was left for the patient providing contact information and requesting a return call.  Telephone follow up appointment with care management team member scheduled for: 06/18/20  Barb Merino, RN, BSN, CCM Care Management Coordinator [place] [facility]  Direct Phone: [phone]

## 2020-06-13 ENCOUNTER — Other Ambulatory Visit: Payer: Self-pay | Admitting: Nurse Practitioner

## 2020-06-13 DIAGNOSIS — I10 Essential (primary) hypertension: Secondary | ICD-10-CM

## 2020-06-13 NOTE — Chronic Care Management (AMB) (Signed)
Chronic Care Management   Follow Up Note   06/11/2020 Name: Judy [name] MRN: [id number] DOB: [birth year]  Referred by: [name], FNP Reason for referral : Chronic Care Management (FU RN CM call )   CAMARI QUINTANILLA is a 74 y.o. year old female who is a primary care patient of [name], [place]. The CCM team was consulted for assistance with chronic disease management and care coordination needs.    Review of patient status, including review of consultants reports, relevant laboratory and other test results, and collaboration with appropriate care team members and the patient's provider was performed as part of comprehensive patient evaluation and provision of chronic care management services.    SDOH (Social Determinants of Health) assessments performed: Yes See Care Plan activities for detailed interventions related to [place])   Placed outbound CCM RN CM call to patient to f/u on the referral placed by PCP for [facility].     Outpatient Encounter Medications as of 06/11/2020  Medication Sig  . acetaminophen (TYLENOL) 325 MG tablet Take 2 tablets (650 mg total) by mouth every 6 (six) hours as needed for mild pain (or Fever >/= 101).  [name] apixaban (ELIQUIS) 5 MG TABS tablet Take 1-2 tablets (5-10 mg total) by mouth 2 (two) times daily. Take 2 tablets (10mg ) daily 2 times daily through 11/24/2019, then starting 11/25/2019 start taking 1 tablet (5mg ) 2 times daily.  [name] atorvastatin (LIPITOR) 10 MG tablet Take 1 tablet (10 mg total) by mouth daily.  . bisacodyl (DULCOLAX) 5 MG EC tablet Take 1 tablet (5 mg total) by mouth daily as needed for moderate constipation.  [name] buPROPion (WELLBUTRIN XL) 150 MG 24 hr tablet Take 1 tablet (150 mg total) by mouth daily.  [name] docusate sodium (COLACE) 100 MG capsule Take 100 mg by mouth 2 (two) times daily.  [name] gabapentin (NEURONTIN) 300 MG capsule Take 1 capsule (300 mg total) by mouth 3 (three) times daily.  . Lurasidone HCl (LATUDA) 120 MG TABS Take 1  tablet (120 mg total) by mouth at bedtime.  . meclizine (ANTIVERT) 25 MG tablet Take 1 tablet (25 mg total) by mouth 3 (three) times daily as needed for dizziness.  . Multiple Vitamin (DAILY VITE PO) Take 1 tablet by mouth daily.  . traMADol (ULTRAM) 50 MG tablet Take 1 tablet (50 mg total) by mouth every 6 (six) hours as needed for moderate pain.  [place] Tosylate (INGREZZA) 40 MG CAPS Take 40 mg by mouth daily.  . Vitamin D, Ergocalciferol, (DRISDOL) 1.25 MG (50000 UNIT) CAPS capsule Take 1 capsule (50,000 Units total) by mouth every 7 (seven) days.  . [DISCONTINUED] amLODipine (NORVASC) 5 MG tablet Take 1 tablet (5 mg total) by mouth daily. PATIENT NEEDS AN APPOINTMENT   No facility-administered encounter medications on file as of 06/11/2020.     Objective:  Lab Results  Component Value Date   HGBA1C 5.6 07/10/2018   HGBA1C 5.7 (H) 03/25/2017   HGBA1C 5.6 07/20/2016   Lab Results  Component Value Date   LDLCALC 143 (H) 10/30/2019   CREATININE 0.94 11/20/2019   BP Readings from Last 3 Encounters:  11/20/19 (!) 119/57  11/08/19 121/72  10/24/19 (!) 147/68    Goals Addressed      Patient Stated   .  "I need help getting physical therapy" (pt-stated)        CARE PLAN ENTRY (see longitudinal plan of care for additional care plan information)  Current Barriers:  [name] Knowledge Deficits  related to disease process and Self Health management of Tardive Dyskinesia  . Chronic Disease Management support and education needs related to HTN, Mixed Hyperlipidemia, Hypokalemia, Tardive dyskinesia  Nurse Case Manager Clinical Goal(s):  [name] Over the next 90 days, patient will work with the CCM team and PCP to address needs related to evaluation and treatment of impaired physical mobility and the inability to perform ADL's/iADL's  CCM RN CM Interventions:  06/11/20 call completed with patient  . Inter-disciplinary care team collaboration (see longitudinal plan of care) . Evaluation of  current treatment plan related to Tardive Dyskensia and patient's adherence to plan as established by provider . Determined Ms. Swigert continues to have impaired physical mobility and near falls; Determined she feels she needs PT/OT and a home safety evaluation  . Advised Ms. Florene Glen PCP [name], FNP has ordered [facility] for evaluation/treatment of impaired physical mobility and HSE  . Educated patient about [facility] and the multitude of services they can offer if deemed necissary . Discussed having Ms. [place] contact the RN CM if she does not receive a call from [facility] concerning this referral by the end of the week  . Discussed plans with patient for ongoing care management follow up and provided patient with direct contact information for care management team  CCM SW Interventions: Completed 05/30/20 . Collaboration with RN Care Manager who reports patient was denied Medicaid and is in need of caregiver resources . Successful outbound call placed to the patient to discuss resources available to the patient . Provided education on [facility] o Patient declined referral . Provided education on [facility] in home aide program o Patient declined referral . Discussed barriers to caregiver resources due to patients reluctance to accept referrals and inability to afford private duty care . Provided the patient with verbal and written information on the [facility] [place] "[employer]" o Advised the patient to contact this resource to determine if alternative resources are available to meet the patient need  Patient Self Care Activities:  . Self administers medications as prescribed . Attends all scheduled provider appointments . Calls pharmacy for medication refills . Calls provider office for new concerns or questions  Please see past updates related to this goal by clicking on the "Past Updates" button in the selected goal        Plan:    Telephone follow up appointment with care management team member scheduled for: 06/18/20  Barb Merino, RN, BSN, CCM Care Management Coordinator [place] [facility]  Direct Phone: [phone]

## 2020-06-13 NOTE — Patient Instructions (Signed)
Visit Information  Goals Addressed              This Visit's Progress     Patient Stated   .  "I need help getting physical therapy" (pt-stated)        CARE PLAN ENTRY (see longitudinal plan of care for additional care plan information)  Current Barriers:  [name] Knowledge Deficits related to disease process and Self Health management of Tardive Dyskinesia  . Chronic Disease Management support and education needs related to HTN, Mixed Hyperlipidemia, Hypokalemia, Tardive dyskinesia  Nurse Case Manager Clinical Goal(s):  [name] Over the next 90 days, patient will work with the CCM team and PCP to address needs related to evaluation and treatment of impaired physical mobility and the inability to perform ADL's/iADL's  CCM RN CM Interventions:  06/11/20 call completed with patient  . Inter-disciplinary care team collaboration (see longitudinal plan of care) . Evaluation of current treatment plan related to Tardive Dyskensia and patient's adherence to plan as established by provider . Determined Ms. Giraldo continues to have impaired physical mobility and near falls; Determined she feels she needs PT/OT and a home safety evaluation  . Advised Ms. Florene Glen PCP [name], FNP has ordered [facility] for evaluation/treatment of impaired physical mobility and HSE  . Educated patient about [facility] and the multitude of services they can offer if deemed necissary . Discussed having Ms. [place]ott[place] contact the RN CM if she does not receive a call from [facility] concerning this referral by the end of the week  . Discussed plans with patient for ongoing care management follow up and provided patient with direct contact information for care management team  CCM SW Interventions: Completed 05/30/20 . Collaboration with RN Care Manager who reports patient was denied Medicaid and is in need of caregiver resources . Successful outbound call placed to the patient to discuss resources available to the  patient . Provided education on [facility] o Patient declined referral . Provided education on [facility] in home aide program o Patient declined referral . Discussed barriers to caregiver resources due to patients reluctance to accept referrals and inability to afford private duty care . Provided the patient with verbal and written information on the [facility] [place] "[employer]" o Advised the patient to contact this resource to determine if alternative resources are available to meet the patient need  Patient Self Care Activities:  . Self administers medications as prescribed . Attends all scheduled provider appointments . Calls pharmacy for medication refills . Calls provider office for new concerns or questions  Please see past updates related to this goal by clicking on the "Past Updates" button in the selected goal        Patient verbalizes understanding of instructions provided today.   Telephone follow up appointment with care management team member scheduled for: 06/18/20  Barb Merino, RN, BSN, CCM Care Management Coordinator [place] [facility]  Direct Phone: [phone]

## 2020-06-15 ENCOUNTER — Other Ambulatory Visit: Payer: Self-pay | Admitting: Physician Assistant

## 2020-06-17 NOTE — Telephone Encounter (Signed)
Please review

## 2020-06-18 ENCOUNTER — Telehealth: Payer: Self-pay

## 2020-06-18 ENCOUNTER — Telehealth: Payer: Medicare Other

## 2020-06-18 NOTE — Telephone Encounter (Cosign Needed)
  Chronic Care Management   Outreach Note  06/18/2020 Name: Judy [name] MRN: [id number] DOB: 1946/08/05  Referred by: [name], FNP Reason for referral : Chronic Care Management (FU RN CM Call )   An unsuccessful telephone outreach was attempted today. The patient was referred to the case management team for assistance with care management and care coordination.   Follow Up Plan: A HIPPA compliant phone message was left for the patient providing contact information and requesting a return call.  Follow up with provider re: referral to [place], RN, BSN, CCM Care Management Coordinator [place] [facility]  Direct Phone: [phone]

## 2020-06-20 ENCOUNTER — Ambulatory Visit: Payer: Self-pay

## 2020-06-20 ENCOUNTER — Telehealth: Payer: Medicare Other

## 2020-06-20 ENCOUNTER — Other Ambulatory Visit: Payer: Self-pay

## 2020-06-20 DIAGNOSIS — G2401 Drug induced subacute dyskinesia: Secondary | ICD-10-CM

## 2020-06-20 DIAGNOSIS — E876 Hypokalemia: Secondary | ICD-10-CM

## 2020-06-20 DIAGNOSIS — E782 Mixed hyperlipidemia: Secondary | ICD-10-CM

## 2020-06-20 DIAGNOSIS — I10 Essential (primary) hypertension: Secondary | ICD-10-CM

## 2020-06-26 NOTE — Patient Instructions (Signed)
Visit Information  Goals Addressed      Patient Stated     "I need help getting physical therapy" (pt-stated)        CARE PLAN ENTRY (see longitudinal plan of care for additional care plan information)  Current Barriers:   Knowledge Deficits related to disease process and Self Health management of Tardive Dyskinesia   Chronic Disease Management support and education needs related to HTN, Mixed Hyperlipidemia, Hypokalemia, Tardive dyskinesia  Nurse Case Manager Clinical Goal(s):   Over the next 90 days, patient will work with the CCM team and PCP to address needs related to evaluation and treatment of impaired physical mobility and the inability to perform ADL's/iADL's  CCM RN CM Interventions:  06/11/20 call completed with patient   Inter-disciplinary care team collaboration (see longitudinal plan of care)  Evaluation of current treatment plan related to Tardive Dyskensia and patient's adherence to plan as established by provider  Determined Ms. Younce continues to have impaired physical mobility and near falls; Determined she feels she needs PT/OT and a home safety evaluation   Advised Ms. Florene Glen PCP [name], FNP has ordered [facility] for evaluation/treatment of impaired physical mobility and HSE   Educated patient about [facility] and the multitude of services they can offer if deemed necissary  Discussed having Ms. Candelaria contact the RN CM if she does not receive a call from [place] concerning this referral by the end of the week   Discussed plans with patient for ongoing care management follow up and provided patient with direct contact information for care management team 06/20/20 CCM RN CM collaboration with [facility] nurse Reche Dixon RN  Confirmed the [facility] referral was received by Cecille Rubin from PCP with missing information  Determined this patient will be assigned to a home base nurse for evaluation and treatment per PCP orders  Received  message from [place] RN stating she is assigned to make a home visit with this patient in the afternoon on 06/20/20  Placed outbound call to patient to confirm she is aware of the home visit with [facility] nurse Larena Glassman RN for evaluation and treatment  Determined patient states she is aware and is appreciative of my assistance with coordinating this service  Discussed plans with patient for ongoing care management follow up and provided patient with direct contact information for care management team  CCM SW Interventions: Completed 05/30/20  Collaboration with [employer] who reports patient was denied Medicaid and is in need of caregiver resources  Successful outbound call placed to the patient to discuss resources available to the patient  Provided education on [facility] o Patient declined referral  Provided education on [facility] in home aide program o Patient declined referral  Discussed barriers to caregiver resources due to patients reluctance to accept referrals and inability to afford private duty care  Provided the patient with verbal and written information on the [employer] [facility] [place] "[employer]" o Advised the patient to contact this resource to determine if alternative resources are available to meet the patient need  Patient Self Care Activities:   Self administers medications as prescribed  Attends all scheduled provider appointments  Calls pharmacy for medication refills  Calls provider office for new concerns or questions  Please see past updates related to this goal by clicking on the "Past Updates" button in the selected goal        Patient verbalizes understanding of instructions provided today.  Telephone follow up appointment with care management team member scheduled for: 07/11/20  Barb Merino, RN, BSN, CCM Care Management Coordinator [place] [facility]  Direct  Phone: [phone]

## 2020-06-26 NOTE — Chronic Care Management (AMB) (Signed)
Chronic Care Management   Follow Up Note   06/20/2020 Name: ADEANA GRILLIOT MRN: [id number] DOB: June 28, 1946  Referred by: [name], FNP Reason for referral : Chronic Care Management (FU RN CM Call )   CARROLYN HILMES is a 74 y.o. year old female who is a primary care patient of [name], [place]. The CCM team was consulted for assistance with chronic disease management and care coordination needs.    Review of patient status, including review of consultants reports, relevant laboratory and other test results, and collaboration with appropriate care team members and the patient's provider was performed as part of comprehensive patient evaluation and provision of chronic care management services.    SDOH (Social Determinants of Health) assessments performed: No See Care Plan activities for detailed interventions related to [place])   Placed outbound CCM RN CM call to patient to f/u on [facility] referral and home visit.     Outpatient Encounter Medications as of 06/20/2020  Medication Sig  . acetaminophen (TYLENOL) 325 MG tablet Take 2 tablets (650 mg total) by mouth every 6 (six) hours as needed for mild pain (or Fever >/= 101).  [name] amLODipine (NORVASC) 5 MG tablet TAKE 1 TABLET (5 MG TOTAL) BY MOUTH DAILY. PATIENT NEEDS AN APPOINTMENT  . apixaban (ELIQUIS) 5 MG TABS tablet Take 1-2 tablets (5-10 mg total) by mouth 2 (two) times daily. Take 2 tablets (10mg ) daily 2 times daily through 11/24/2019, then starting 11/25/2019 start taking 1 tablet (5mg ) 2 times daily.  [name] atorvastatin (LIPITOR) 10 MG tablet Take 1 tablet (10 mg total) by mouth daily.  . bisacodyl (DULCOLAX) 5 MG EC tablet Take 1 tablet (5 mg total) by mouth daily as needed for moderate constipation.  [name] buPROPion (WELLBUTRIN XL) 150 MG 24 hr tablet Take 1 tablet (150 mg total) by mouth daily.  [name] docusate sodium (COLACE) 100 MG capsule Take 100 mg by mouth 2 (two) times daily.  [name] gabapentin (NEURONTIN) 300 MG capsule Take 1  capsule (300 mg total) by mouth 3 (three) times daily.  . Lurasidone HCl (LATUDA) 120 MG TABS Take 1 tablet (120 mg total) by mouth at bedtime.  . meclizine (ANTIVERT) 25 MG tablet Take 1 tablet (25 mg total) by mouth 3 (three) times daily as needed for dizziness.  . Multiple Vitamin (DAILY VITE PO) Take 1 tablet by mouth daily.  . traMADol (ULTRAM) 50 MG tablet Take 1 tablet (50 mg total) by mouth every 6 (six) hours as needed for moderate pain.  [employer] Tosylate (INGREZZA) 40 MG CAPS Take 40 mg by mouth daily.  . Vitamin D, Ergocalciferol, (DRISDOL) 1.25 MG (50000 UNIT) CAPS capsule Take 1 capsule (50,000 Units total) by mouth every 7 (seven) days.   No facility-administered encounter medications on file as of 06/20/2020.     Objective:  Lab Results  Component Value Date   HGBA1C 5.6 07/10/2018   HGBA1C 5.7 (H) 03/25/2017   HGBA1C 5.6 07/20/2016   Lab Results  Component Value Date   LDLCALC 143 (H) 10/30/2019   CREATININE 0.94 11/20/2019   BP Readings from Last 3 Encounters:  11/20/19 (!) 119/57  11/08/19 121/72  10/24/19 (!) 147/68    Goals Addressed      Patient Stated   .  "I need help getting physical therapy" (pt-stated)        CARE PLAN ENTRY (see longitudinal plan of care for additional care plan information)  Current Barriers:  [name] Knowledge Deficits related to disease  process and Self Health management of Tardive Dyskinesia  . Chronic Disease Management support and education needs related to HTN, Mixed Hyperlipidemia, Hypokalemia, Tardive dyskinesia  Nurse Case Manager Clinical Goal(s):  [name] Over the next 90 days, patient will work with the CCM team and PCP to address needs related to evaluation and treatment of impaired physical mobility and the inability to perform ADL's/iADL's  CCM RN CM Interventions:  06/11/20 call completed with patient  . Inter-disciplinary care team collaboration (see longitudinal plan of care) . Evaluation of current treatment plan  related to Tardive Dyskensia and patient's adherence to plan as established by provider . Determined Ms. Cullens continues to have impaired physical mobility and near falls; Determined she feels she needs PT/OT and a home safety evaluation  . Advised Ms. Florene Glen PCP [name], FNP has ordered [facility] for evaluation/treatment of impaired physical mobility and HSE  . Educated patient about [facility] and the multitude of services they can offer if deemed necissary . Discussed having Ms. Lacaze contact the RN CM if she does not receive a call from [facility] concerning this referral by the end of the week  . Discussed plans with patient for ongoing care management follow up and provided patient with direct contact information for care management team 06/20/20 CCM RN CM collaboration with [facility] nurse Reche Dixon RN . Confirmed the [facility] referral was received by Cecille Rubin from PCP with missing information . Determined this patient will be assigned to a home base nurse for evaluation and treatment per PCP orders . Received message from [place] RN stating she is assigned to make a home visit with this patient in the afternoon on 06/20/20 . Placed outbound call to patient to confirm she is aware of the home visit with [facility] nurse Larena Glassman RN for evaluation and treatment . Determined patient states she is aware and is appreciative of my assistance with coordinating this service . Discussed plans with patient for ongoing care management follow up and provided patient with direct contact information for care management team  CCM SW Interventions: Completed 05/30/20 . Collaboration with RN Care Manager who reports patient was denied Medicaid and is in need of caregiver resources . Successful outbound call placed to the patient to discuss resources available to the patient . Provided education on [facility] o Patient declined referral . Provided education  on [facility] in home aide program o Patient declined referral . Discussed barriers to caregiver resources due to patients reluctance to accept referrals and inability to afford private duty care . Provided the patient with verbal and written information on the [employer] [facility] [place] "[employer]" o Advised the patient to contact this resource to determine if alternative resources are available to meet the patient need  Patient Self Care Activities:  . Self administers medications as prescribed . Attends all scheduled provider appointments . Calls pharmacy for medication refills . Calls provider office for new concerns or questions  Please see past updates related to this goal by clicking on the "Past Updates" button in the selected goal        Plan:   Telephone follow up appointment with care management team member scheduled for: 07/11/20  Barb Merino, RN, BSN, CCM Care Management Coordinator [place] [facility]  Direct Phone: [phone]

## 2020-06-30 DIAGNOSIS — I10 Essential (primary) hypertension: Secondary | ICD-10-CM | POA: Diagnosis not present

## 2020-06-30 DIAGNOSIS — M48061 Spinal stenosis, lumbar region without neurogenic claudication: Secondary | ICD-10-CM | POA: Diagnosis not present

## 2020-06-30 DIAGNOSIS — K219 Gastro-esophageal reflux disease without esophagitis: Secondary | ICD-10-CM | POA: Diagnosis not present

## 2020-06-30 DIAGNOSIS — Z79899 Other long term (current) drug therapy: Secondary | ICD-10-CM | POA: Diagnosis not present

## 2020-06-30 DIAGNOSIS — K5900 Constipation, unspecified: Secondary | ICD-10-CM | POA: Diagnosis not present

## 2020-06-30 DIAGNOSIS — Z86711 Personal history of pulmonary embolism: Secondary | ICD-10-CM | POA: Diagnosis not present

## 2020-06-30 DIAGNOSIS — Z9181 History of falling: Secondary | ICD-10-CM | POA: Diagnosis not present

## 2020-06-30 DIAGNOSIS — M4800 Spinal stenosis, site unspecified: Secondary | ICD-10-CM | POA: Diagnosis not present

## 2020-06-30 DIAGNOSIS — H9192 Unspecified hearing loss, left ear: Secondary | ICD-10-CM | POA: Diagnosis not present

## 2020-07-01 ENCOUNTER — Telehealth: Payer: Medicare Other

## 2020-07-01 ENCOUNTER — Other Ambulatory Visit: Payer: Self-pay

## 2020-07-01 ENCOUNTER — Ambulatory Visit: Payer: Self-pay

## 2020-07-01 DIAGNOSIS — H9192 Unspecified hearing loss, left ear: Secondary | ICD-10-CM | POA: Diagnosis not present

## 2020-07-01 DIAGNOSIS — M48061 Spinal stenosis, lumbar region without neurogenic claudication: Secondary | ICD-10-CM | POA: Diagnosis not present

## 2020-07-01 DIAGNOSIS — E782 Mixed hyperlipidemia: Secondary | ICD-10-CM

## 2020-07-01 DIAGNOSIS — Z9181 History of falling: Secondary | ICD-10-CM | POA: Diagnosis not present

## 2020-07-01 DIAGNOSIS — Z79899 Other long term (current) drug therapy: Secondary | ICD-10-CM | POA: Diagnosis not present

## 2020-07-01 DIAGNOSIS — I10 Essential (primary) hypertension: Secondary | ICD-10-CM

## 2020-07-01 DIAGNOSIS — K5900 Constipation, unspecified: Secondary | ICD-10-CM | POA: Diagnosis not present

## 2020-07-01 DIAGNOSIS — M4800 Spinal stenosis, site unspecified: Secondary | ICD-10-CM | POA: Diagnosis not present

## 2020-07-01 DIAGNOSIS — G2401 Drug induced subacute dyskinesia: Secondary | ICD-10-CM

## 2020-07-01 DIAGNOSIS — E876 Hypokalemia: Secondary | ICD-10-CM

## 2020-07-01 DIAGNOSIS — K219 Gastro-esophageal reflux disease without esophagitis: Secondary | ICD-10-CM | POA: Diagnosis not present

## 2020-07-01 DIAGNOSIS — Z86711 Personal history of pulmonary embolism: Secondary | ICD-10-CM | POA: Diagnosis not present

## 2020-07-02 NOTE — Chronic Care Management (AMB) (Addendum)
Chronic Care Management   Follow Up Note   07/01/2020 Name: Judy [name] MRN: [id number] DOB: 1946-04-10  Referred by: [name], FNP Reason for referral : Chronic Care Management (FU RN CM Collaboration )   Judy [name] is a 74 y.o. year old female who is a primary care patient of [name], [place]. The CCM team was consulted for assistance with chronic disease management and care coordination needs.    Review of patient status, including review of consultants reports, relevant laboratory and other test results, and collaboration with appropriate care team members and the patient's provider was performed as part of comprehensive patient evaluation and provision of chronic care management services.    SDOH (Social Determinants of Health) assessments performed: No See Care Plan activities for detailed interventions related to [facility])   Collaborative call completed with [facility] nurse Larena Glassman RN regarding patient's admission into [facility] for clinical support.     Outpatient Encounter Medications as of 07/01/2020  Medication Sig  . acetaminophen (TYLENOL) 325 MG tablet Take 2 tablets (650 mg total) by mouth every 6 (six) hours as needed for mild pain (or Fever >/= 101).  Judy [name] amLODipine (NORVASC) 5 MG tablet TAKE 1 TABLET (5 MG TOTAL) BY MOUTH DAILY. PATIENT NEEDS AN APPOINTMENT  . apixaban (ELIQUIS) 5 MG TABS tablet Take 1-2 tablets (5-10 mg total) by mouth 2 (two) times daily. Take 2 tablets (10mg ) daily 2 times daily through 11/24/2019, then starting 11/25/2019 start taking 1 tablet (5mg ) 2 times daily.  Judy [name] atorvastatin (LIPITOR) 10 MG tablet Take 1 tablet (10 mg total) by mouth daily.  . bisacodyl (DULCOLAX) 5 MG EC tablet Take 1 tablet (5 mg total) by mouth daily as needed for moderate constipation.  Judy [name] buPROPion (WELLBUTRIN XL) 150 MG 24 hr tablet Take 1 tablet (150 mg total) by mouth daily.  Judy [name] docusate sodium (COLACE) 100 MG capsule Take 100 mg by mouth 2 (two) times  daily.  Judy [name] gabapentin (NEURONTIN) 300 MG capsule Take 1 capsule (300 mg total) by mouth 3 (three) times daily.  . Lurasidone HCl (LATUDA) 120 MG TABS Take 1 tablet (120 mg total) by mouth at bedtime.  . meclizine (ANTIVERT) 25 MG tablet Take 1 tablet (25 mg total) by mouth 3 (three) times daily as needed for dizziness.  . Multiple Vitamin (DAILY VITE PO) Take 1 tablet by mouth daily.  . traMADol (ULTRAM) 50 MG tablet Take 1 tablet (50 mg total) by mouth every 6 (six) hours as needed for moderate pain.  [employer] [place] Tosylate (INGREZZA) 40 MG CAPS Take 40 mg by mouth daily.  . Vitamin D, Ergocalciferol, (DRISDOL) 1.25 MG (50000 UNIT) CAPS capsule Take 1 capsule (50,000 Units total) by mouth every 7 (seven) days.   No facility-administered encounter medications on file as of 07/01/2020.     Objective:  Lab Results  Component Value Date   HGBA1C 5.6 07/10/2018   HGBA1C 5.7 (H) 03/25/2017   HGBA1C 5.6 07/20/2016   Lab Results  Component Value Date   LDLCALC 143 (H) 10/30/2019   CREATININE 0.94 11/20/2019   BP Readings from Last 3 Encounters:  11/20/19 (!) 119/57  11/08/19 121/72  10/24/19 (!) 147/68    Goals Addressed      Patient Stated   .  "I need help getting physical therapy" (pt-stated)   On track     [place] (see longitudinal plan of care for additional care plan information)  Current Barriers:  Judy [name] Knowledge Deficits related  to disease process and Self Health management of Tardive Dyskinesia  . Chronic Disease Management support and education needs related to HTN, Mixed Hyperlipidemia, Hypokalemia, Tardive dyskinesia  Nurse Case Manager Clinical Goal(s):  Judy [name] Over the next 90 days, patient will work with the CCM team and PCP to address needs related to evaluation and treatment of impaired physical mobility and the inability to perform ADL's/iADL's  CCM RN CM Interventions:  07/01/20 Collaborative call with [facility] nurse  . Inter-disciplinary care team  collaboration (see longitudinal plan of care) . Evaluation of current treatment plan related to Tardive Dyskinesia and patient's adherence to plan as established by provider . Collaborative call completed with [facility] nurse Larena Glassman RN to confirm patient has been admitted into [facility] for clinical support . Discussed [facility] will be performing PT/OT to assist with mobility, strengthening and motor deficits secondary to Tardive Dyskinesia . Discussed Larena Glassman RN will focus on medication management with patient at next home visit due to patient's current home system may need improvement,  she will discuss transitioning to [facility]  Patient Self Care Activities:  . Self administers medications as prescribed . Attends all scheduled provider appointments . Calls pharmacy for medication refills . Calls provider office for new concerns or questions  Please see past updates related to this goal by clicking on the "Past Updates" button in the selected goal        Plan:   Telephone follow up appointment with care management team member scheduled for: 07/11/20  Barb Merino, RN, BSN, CCM Care Management Coordinator [place] [facility]  Direct Phone: [phone]

## 2020-07-04 ENCOUNTER — Telehealth: Payer: Self-pay

## 2020-07-04 ENCOUNTER — Telehealth: Payer: Medicare Other

## 2020-07-04 NOTE — Telephone Encounter (Signed)
  Chronic Care Management   Outreach Note  07/04/2020 Name: Judy [name] MRN: [id number] DOB: 23-Dec-1945  Referred by: [name], FNP Reason for referral : Care Coordination   An unsuccessful telephone outreach was attempted today to assist the patient with transportation resources. The patient was referred to the case management team for assistance with care management and care coordination.   Follow Up Plan: A HIPAA compliant phone message was left for the patient providing contact information and requesting a return call.  The care management team will reach out to the patient again over the next 10 days.   Daneen Schick, BSW, CDP Social Worker, Certified Dementia Practitioner [place] [facility] [place] [facility] [phone]

## 2020-07-09 ENCOUNTER — Other Ambulatory Visit: Payer: Self-pay | Admitting: Nurse Practitioner

## 2020-07-09 DIAGNOSIS — I10 Essential (primary) hypertension: Secondary | ICD-10-CM

## 2020-07-10 DIAGNOSIS — M4800 Spinal stenosis, site unspecified: Secondary | ICD-10-CM | POA: Diagnosis not present

## 2020-07-10 DIAGNOSIS — M48061 Spinal stenosis, lumbar region without neurogenic claudication: Secondary | ICD-10-CM | POA: Diagnosis not present

## 2020-07-10 DIAGNOSIS — Z79899 Other long term (current) drug therapy: Secondary | ICD-10-CM | POA: Diagnosis not present

## 2020-07-10 DIAGNOSIS — K219 Gastro-esophageal reflux disease without esophagitis: Secondary | ICD-10-CM | POA: Diagnosis not present

## 2020-07-10 DIAGNOSIS — Z86711 Personal history of pulmonary embolism: Secondary | ICD-10-CM | POA: Diagnosis not present

## 2020-07-10 DIAGNOSIS — Z9181 History of falling: Secondary | ICD-10-CM | POA: Diagnosis not present

## 2020-07-10 DIAGNOSIS — K5900 Constipation, unspecified: Secondary | ICD-10-CM | POA: Diagnosis not present

## 2020-07-10 DIAGNOSIS — I10 Essential (primary) hypertension: Secondary | ICD-10-CM | POA: Diagnosis not present

## 2020-07-10 DIAGNOSIS — H9192 Unspecified hearing loss, left ear: Secondary | ICD-10-CM | POA: Diagnosis not present

## 2020-07-11 ENCOUNTER — Ambulatory Visit: Payer: Medicare Other

## 2020-07-11 ENCOUNTER — Telehealth: Payer: Self-pay

## 2020-07-11 ENCOUNTER — Telehealth: Payer: Medicare Other

## 2020-07-11 DIAGNOSIS — E876 Hypokalemia: Secondary | ICD-10-CM

## 2020-07-11 DIAGNOSIS — E782 Mixed hyperlipidemia: Secondary | ICD-10-CM

## 2020-07-11 DIAGNOSIS — G2401 Drug induced subacute dyskinesia: Secondary | ICD-10-CM

## 2020-07-11 DIAGNOSIS — I10 Essential (primary) hypertension: Secondary | ICD-10-CM

## 2020-07-11 NOTE — Patient Instructions (Signed)
Social Worker Visit Information  Goals we discussed today:  Goals Addressed            This Visit's Progress   . Collaborate with RN Care Manager to assist with care coordination needs       CARE PLAN ENTRY (see longitudinal plan of care for additional care plan information)  Current Barriers:  . Transportation . Refuses to access [employer] for transportation although she is approved . Limited knowledge of health plan transportation program . Chronic conditions including HTN, Mixed Hyperlipidemia, Hypokalemia, and Tardive Dyskinesia which increase patients risk of hospitalization  Social Work Clinical Goal(s):  [name] Over the next 60 days the patient will work with SW to become more knowledgeable of her health plan transportation benefit   CCM SW Interventions: Completed 07/11/20 . Inter-disciplinary care team collaboration (see longitudinal plan of care) . Successful outbound call placed to the patient to assist with care coordination needs . Discussed the patient is in need of transportation resources for physician appointments . Performed chart review to note patient is active and approved to use [employer] transportation services o The patient does not want to use this resource . Provided education on the patients health plan transportation benefit o The patient is interested in learning more about this resource . Mailed the patient information on transportation benefits under her [place] . Scheduled follow up call over the next four weeks  Patient Self Care Activities:  . Self administers medications as prescribed . Attends all scheduled provider appointments . Calls pharmacy for medication refills . Calls provider office for new concerns or questions  Initial goal documentation         Materials Provided: Yes: provided information on health plan transportation benefit  Follow Up Plan: SW will follow up with patient by phone over the next four  weeks.   Daneen Schick, BSW, CDP Social Worker, Certified Dementia Practitioner [place] [facility] [place] [facility] [phone]

## 2020-07-11 NOTE — Telephone Encounter (Cosign Needed)
  Chronic Care Management   Outreach Note  07/11/2020 Name: Judy [name] MRN: [id number] DOB: 06/04/1946  Referred by: [name], FNP Reason for referral : Chronic Care Management (CCM RN CM FU call )   An unsuccessful telephone outreach was attempted today. The patient was referred to the case management team for assistance with care management and care coordination.   Follow Up Plan: A HIPAA compliant phone message was left for the patient providing contact information and requesting a return call.  Telephone follow up appointment with care management team member scheduled for: 08/06/20  Barb Merino, RN, BSN, CCM Care Management Coordinator [place] [facility]  Direct Phone: [phone]

## 2020-07-11 NOTE — Chronic Care Management (AMB) (Signed)
Chronic Care Management    Social Work Follow Up Note  07/11/2020 Name: Judy [name] MRN: [id number] DOB: 25-Mar-1946  Judy [name] is a 74 y.o. year old female who is a primary care patient of [name], [place]. The CCM team was consulted for assistance with care coordination.   Review of patient status, including review of consultants reports, other relevant assessments, and collaboration with appropriate care team members and the patient's provider was performed as part of comprehensive patient evaluation and provision of chronic care management services.    SDOH (Social Determinants of Health) assessments performed: No    Outpatient Encounter Medications as of 07/11/2020  Medication Sig   acetaminophen (TYLENOL) 325 MG tablet Take 2 tablets (650 mg total) by mouth every 6 (six) hours as needed for mild pain (or Fever >/= 101).   amLODipine (NORVASC) 5 MG tablet TAKE 1 TABLET (5 MG TOTAL) BY MOUTH DAILY. PATIENT NEEDS AN APPOINTMENT   apixaban (ELIQUIS) 5 MG TABS tablet Take 1-2 tablets (5-10 mg total) by mouth 2 (two) times daily. Take 2 tablets (10mg ) daily 2 times daily through 11/24/2019, then starting 11/25/2019 start taking 1 tablet (5mg ) 2 times daily.   atorvastatin (LIPITOR) 10 MG tablet Take 1 tablet (10 mg total) by mouth daily.   bisacodyl (DULCOLAX) 5 MG EC tablet Take 1 tablet (5 mg total) by mouth daily as needed for moderate constipation.   buPROPion (WELLBUTRIN XL) 150 MG 24 hr tablet Take 1 tablet (150 mg total) by mouth daily.   docusate sodium (COLACE) 100 MG capsule Take 100 mg by mouth 2 (two) times daily.   gabapentin (NEURONTIN) 300 MG capsule Take 1 capsule (300 mg total) by mouth 3 (three) times daily.   Lurasidone HCl (LATUDA) 120 MG TABS Take 1 tablet (120 mg total) by mouth at bedtime.   meclizine (ANTIVERT) 25 MG tablet Take 1 tablet (25 mg total) by mouth 3 (three) times daily as needed for dizziness.   Multiple Vitamin (DAILY VITE PO) Take 1  tablet by mouth daily.   traMADol (ULTRAM) 50 MG tablet Take 1 tablet (50 mg total) by mouth every 6 (six) hours as needed for moderate pain.   Valbenazine Tosylate (INGREZZA) 40 MG CAPS Take 40 mg by mouth daily.   Vitamin D, Ergocalciferol, (DRISDOL) 1.25 MG (50000 UNIT) CAPS capsule Take 1 capsule (50,000 Units total) by mouth every 7 (seven) days.   No facility-administered encounter medications on file as of 07/11/2020.     Goals Addressed            This Visit's Progress    Collaborate with RN Care Manager to assist with care coordination needs       CARE PLAN ENTRY (see longitudinal plan of care for additional care plan information)  Current Barriers:   Transportation  Refuses to access [employer] for transportation although she is approved  Limited knowledge of health plan transportation program  Chronic conditions including HTN, Mixed Hyperlipidemia, Hypokalemia, and Tardive Dyskinesia which increase patients risk of hospitalization  Social Work Clinical Goal(s):   Over the next 60 days the patient will work with SW to become more knowledgeable of her health plan transportation benefit   CCM SW Interventions: Completed 07/11/20  Inter-disciplinary care team collaboration (see longitudinal plan of care)  Successful outbound call placed to the patient to assist with care coordination needs  Discussed the patient is in need of transportation resources for physician appointments  Performed chart review to note patient is  active and approved to use [employer] transportation services o The patient does not want to use this resource  Provided education on the patients health plan transportation benefit o The patient is interested in learning more about this resource  Mailed the patient information on transportation benefits under her [place]  Scheduled follow up call over the next four weeks  Patient Self Care Activities:   Self administers  medications as prescribed  Attends all scheduled provider appointments  Calls pharmacy for medication refills  Calls provider office for new concerns or questions  Initial goal documentation         Follow Up Plan: SW will follow up with patient by phone over the next four weeks.   Daneen Schick, BSW, CDP Social Worker, Certified Dementia Practitioner [place] [facility] [place] [facility] [phone]  Total time spent performing care coordination and/or care management activities with the patient by phone or face to face = 15 minutes.

## 2020-08-06 ENCOUNTER — Telehealth: Payer: Medicare Other

## 2020-08-07 ENCOUNTER — Telehealth: Payer: Self-pay

## 2020-08-07 NOTE — Telephone Encounter (Signed)
This nurse attempted to call patient in order to follow up on missed AWV. Message left for patient to call back in order to reschedule for another time.

## 2020-08-08 ENCOUNTER — Telehealth: Payer: Self-pay

## 2020-08-08 ENCOUNTER — Telehealth: Payer: Medicare Other

## 2020-08-08 NOTE — Telephone Encounter (Signed)
  Chronic Care Management   Outreach Note  08/08/2020 Name: Judy [name] MRN: [id number] DOB: 10/24/1945  Referred by: [name], FNP Reason for referral : Care Coordination   An unsuccessful telephone outreach was attempted today. The patient was referred to the case management team for assistance with care management and care coordination.   Follow Up Plan: A HIPAA compliant phone message was left for the patient providing contact information and requesting a return call.  The care management team will reach out to the patient again over the next 21 days.   Daneen Schick, BSW, CDP Social Worker, Certified Dementia Practitioner [place] [facility] [place] [facility] [phone]

## 2020-08-12 ENCOUNTER — Telehealth: Payer: Self-pay

## 2020-08-12 ENCOUNTER — Telehealth: Payer: Medicare Other

## 2020-08-12 NOTE — Telephone Encounter (Signed)
Left a message asking the pt to call me at [phone] to reschedule AWV.

## 2020-08-12 NOTE — Telephone Encounter (Signed)
  Chronic Care Management   Outreach Note  08/12/2020 Name: Judy [name] MRN: [id number] DOB: [birth year]  Referred by: [name], FNP Reason for referral : Care Coordination   SW placed an unsuccessful outbound call in response to voice message received requesting assistance rescheduling annual wellness visit.   Follow Up Plan: Collaboration with AWV nurse to request outreach to the patient to reschedule appointment.  Daneen Schick, BSW, CDP Social Worker, Certified Dementia Practitioner [place] [facility] [place] [facility] [phone]

## 2020-08-28 ENCOUNTER — Telehealth: Payer: Medicare Other

## 2020-09-02 ENCOUNTER — Telehealth: Payer: Self-pay

## 2020-09-02 ENCOUNTER — Telehealth: Payer: Medicare Other

## 2020-09-02 NOTE — Telephone Encounter (Signed)
  Chronic Care Management   Outreach Note  09/02/2020 Name: Judy [name] MRN: [id number] DOB: [birth year]  Referred by: [name], FNP Reason for referral : Care Coordination   A second unsuccessful telephone outreach was attempted today. The patient was referred to the case management team for assistance with care management and care coordination.   Follow Up Plan: A HIPAA compliant phone message was left for the patient providing contact information and requesting a return call.  The care management team will reach out to the patient again over the next 20 days.   Daneen Schick, BSW, CDP Social Worker, Certified Dementia Practitioner [place] [facility] [place] [facility] [phone]

## 2020-09-09 ENCOUNTER — Telehealth: Payer: Self-pay

## 2020-09-09 NOTE — Telephone Encounter (Signed)
I left a message asking the pt to call and schedule AWV.

## 2020-09-18 ENCOUNTER — Telehealth: Payer: Medicare Other

## 2020-09-23 ENCOUNTER — Ambulatory Visit: Payer: Medicare HMO | Admitting: Nurse Practitioner

## 2020-09-29 ENCOUNTER — Telehealth: Payer: Medicare Other

## 2020-09-30 ENCOUNTER — Telehealth: Payer: Self-pay | Admitting: Nurse Practitioner

## 2020-09-30 NOTE — Telephone Encounter (Signed)
Left message for patient to call back and schedule Medicare Annual Wellness Visit (AWV)    Last AWV 09/18/19  please schedule at anytime with [facility]    This should be a 45 minute visit.

## 2020-10-08 ENCOUNTER — Ambulatory Visit: Payer: Medicare Other

## 2020-10-08 DIAGNOSIS — E782 Mixed hyperlipidemia: Secondary | ICD-10-CM

## 2020-10-08 DIAGNOSIS — I10 Essential (primary) hypertension: Secondary | ICD-10-CM

## 2020-10-08 NOTE — Chronic Care Management (AMB) (Signed)
Chronic Care Management   Social Work Follow Up Note  10/08/2020 Name: Judy [name] MRN: [id number] DOB: March 26, 1946  Judy [name] is a 74 y.o. year old female who is a primary care patient of [name], [place]. The CCM team was consulted for assistance with care coordination.   Review of patient status, including review of consultants reports, other relevant assessments, and collaboration with appropriate care team members and the patient's provider was performed as part of comprehensive patient evaluation and provision of chronic care management services.    SDOH (Social Determinants of Health) assessments performed: No    Outpatient Encounter Medications as of 10/08/2020  Medication Sig  . acetaminophen (TYLENOL) 325 MG tablet Take 2 tablets (650 mg total) by mouth every 6 (six) hours as needed for mild pain (or Fever >/= 101).  [name] amLODipine (NORVASC) 5 MG tablet TAKE 1 TABLET (5 MG TOTAL) BY MOUTH DAILY. PATIENT NEEDS AN APPOINTMENT  . apixaban (ELIQUIS) 5 MG TABS tablet Take 1-2 tablets (5-10 mg total) by mouth 2 (two) times daily. Take 2 tablets (10mg ) daily 2 times daily through 11/24/2019, then starting 11/25/2019 start taking 1 tablet (5mg ) 2 times daily.  [name] atorvastatin (LIPITOR) 10 MG tablet Take 1 tablet (10 mg total) by mouth daily.  . bisacodyl (DULCOLAX) 5 MG EC tablet Take 1 tablet (5 mg total) by mouth daily as needed for moderate constipation.  [name] buPROPion (WELLBUTRIN XL) 150 MG 24 hr tablet Take 1 tablet (150 mg total) by mouth daily.  [name] docusate sodium (COLACE) 100 MG capsule Take 100 mg by mouth 2 (two) times daily.  [name] gabapentin (NEURONTIN) 300 MG capsule Take 1 capsule (300 mg total) by mouth 3 (three) times daily.  . Lurasidone HCl (LATUDA) 120 MG TABS Take 1 tablet (120 mg total) by mouth at bedtime.  . meclizine (ANTIVERT) 25 MG tablet Take 1 tablet (25 mg total) by mouth 3 (three) times daily as needed for dizziness.  . Multiple Vitamin (DAILY VITE PO) Take 1  tablet by mouth daily.  . traMADol (ULTRAM) 50 MG tablet Take 1 tablet (50 mg total) by mouth every 6 (six) hours as needed for moderate pain.  Minus [place] Tosylate (INGREZZA) 40 MG CAPS Take 40 mg by mouth daily.  . Vitamin D, Ergocalciferol, (DRISDOL) 1.25 MG (50000 UNIT) CAPS capsule Take 1 capsule (50,000 Units total) by mouth every 7 (seven) days.   No facility-administered encounter medications on file as of 10/08/2020.     Patient Care Plan: Social Work Care Plan    Problem Identified: Care Coordination   Priority: Low    Long-Range Goal: Collaborate with RN Care Manager to perform appropriate assessments to assist with care coordination needs   Start Date: 10/08/2020  Expected End Date: 02/05/2021  This Visit's Progress: On track  Priority: Low  Note:   Current Barriers:  . Limited social support . Transportation . Chronic conditions including HTN, Mixed Hyperlipidemia, Hypokalemia, and Tardive Dyskinesia which put patient at increased risk of hospitalization  Social Work Clinical Goal(s):  [name] Over the next 120 days, patient will work with SW to address concerns related to care coordination  Interventions: . 1:1 collaboration with [name], [place] regarding development and update of comprehensive plan of care as evidenced by provider attestation and co-signature . Inter-disciplinary care team collaboration (see longitudinal plan of care) . Successful outbound call placed to the patient to assess for care coordination needs . Determined the patient did not receive mailed resource outlining  how to access health plan transportation benefit . Provided verbal education to the patient how to access transportation benefit . Mailed the patient information on how to access transportation benefit . Performed chart review to note recent unsuccessful outreach attempts to schedule the patient for an annual wellness visit . Assisted with scheduling the patient for an annual wellness  visit on 10/30/20 . Scheduled follow up call to the patient over the next 60 days  Patient Goals/Self-Care Activities Over the next 60 days, patient will:   - Patient will self administer medications as prescribed Patient will attend all scheduled provider appointments Patient will call provider office for new concerns or questions Contact SW as needed prior to next scheduled call  Follow up Plan: SW will follow up with patient by phone over the next 60 days        Follow Up Plan: SW will follow up with patient by phone over the next 60 days   Daneen Schick, BSW, CDP Social Worker, Certified Dementia Practitioner [place] [facility] [place] [facility] [phone]  Total time spent performing care coordination and/or care management activities with the patient by phone or face to face = 15 minutes.

## 2020-10-08 NOTE — Patient Instructions (Signed)
  Goals we discussed today:  Goals Addressed    . Work with SW to manage care coordination needs       Timeframe:  Long-Range Goal Priority:  Low Start Date:    12.29.21                         Expected End Date:   4.28.22                    Next planned date of contact: 2.9.22  Patient Goals/Self-Care Activities Over the next 60 days, patient will:   - Patient will self administer medications as prescribed Patient will attend all scheduled provider appointments Patient will call provider office for new concerns or questions Contact SW as needed prior to next scheduled call

## 2020-10-28 ENCOUNTER — Telehealth: Payer: Self-pay

## 2020-10-28 NOTE — Telephone Encounter (Signed)
Prior Authorization submitted and approved for INGREZZA 40 MG CAPSULE with [employer] [id number] effective 10/20/2020-10/10/2021

## 2020-10-30 ENCOUNTER — Telehealth: Payer: Self-pay | Admitting: Nurse Practitioner

## 2020-10-30 ENCOUNTER — Ambulatory Visit: Payer: Medicare Other

## 2020-10-30 NOTE — Telephone Encounter (Signed)
Left message for patient to call back and schedule Medicare Annual Wellness Visit (AWV) .   Last AWV 09/18/2019  please schedule at anytime with [facility]    This should be a 45 minute visit.

## 2020-11-05 ENCOUNTER — Telehealth: Payer: Medicare Other

## 2020-11-19 ENCOUNTER — Ambulatory Visit: Payer: Medicare HMO

## 2020-11-19 DIAGNOSIS — E782 Mixed hyperlipidemia: Secondary | ICD-10-CM

## 2020-11-19 DIAGNOSIS — I10 Essential (primary) hypertension: Secondary | ICD-10-CM

## 2020-11-19 NOTE — Chronic Care Management (AMB) (Signed)
Chronic Care Management    Social Work Note  11/19/2020 Name: Judy [name] MRN: [id number] DOB: [birth year]  Judy [name] is a 75 y.o. year old female who is a primary care patient of [name], [place]. The CCM team was consulted to assist the patient with chronic disease management and/or care coordination needs related to: Transportation Needs .   Engaged with patient by telephone for follow up visit in response to provider referral for social work chronic care management and care coordination services.   Consent to Services:  The patient was given information about Chronic Care Management services, agreed to services, and gave verbal consent prior to initiation of services.  Please see initial visit note for detailed documentation.   Patient agreed to services and consent obtained.   Assessment: Review of patient past medical history, allergies, medications, and health status, including review of relevant consultants reports was performed today as part of a comprehensive evaluation and provision of chronic care management and care coordination services.     SDOH (Social Determinants of Health) assessments and interventions performed:    Advanced Directives Status: Not addressed in this encounter.  CCM Care Plan  No Known Allergies  Outpatient Encounter Medications as of 11/19/2020  Medication Sig  . acetaminophen (TYLENOL) 325 MG tablet Take 2 tablets (650 mg total) by mouth every 6 (six) hours as needed for mild pain (or Fever >/= 101).  [name] amLODipine (NORVASC) 5 MG tablet TAKE 1 TABLET (5 MG TOTAL) BY MOUTH DAILY. PATIENT NEEDS AN APPOINTMENT  . apixaban (ELIQUIS) 5 MG TABS tablet Take 1-2 tablets (5-10 mg total) by mouth 2 (two) times daily. Take 2 tablets (10mg ) daily 2 times daily through 11/24/2019, then starting 11/25/2019 start taking 1 tablet (5mg ) 2 times daily.  [name] atorvastatin (LIPITOR) 10 MG tablet Take 1 tablet (10 mg total) by mouth daily.  . bisacodyl (DULCOLAX) 5 MG  EC tablet Take 1 tablet (5 mg total) by mouth daily as needed for moderate constipation.  [name] buPROPion (WELLBUTRIN XL) 150 MG 24 hr tablet Take 1 tablet (150 mg total) by mouth daily.  [name] docusate sodium (COLACE) 100 MG capsule Take 100 mg by mouth 2 (two) times daily.  [name] gabapentin (NEURONTIN) 300 MG capsule Take 1 capsule (300 mg total) by mouth 3 (three) times daily.  . Lurasidone HCl (LATUDA) 120 MG TABS Take 1 tablet (120 mg total) by mouth at bedtime.  . meclizine (ANTIVERT) 25 MG tablet Take 1 tablet (25 mg total) by mouth 3 (three) times daily as needed for dizziness.  . Multiple Vitamin (DAILY VITE PO) Take 1 tablet by mouth daily.  . traMADol (ULTRAM) 50 MG tablet Take 1 tablet (50 mg total) by mouth every 6 (six) hours as needed for moderate pain.  Minus Liberty Tosylate (INGREZZA) 40 MG CAPS Take 40 mg by mouth daily.  . Vitamin D, Ergocalciferol, (DRISDOL) 1.25 MG (50000 UNIT) CAPS capsule Take 1 capsule (50,000 Units total) by mouth every 7 (seven) days.   No facility-administered encounter medications on file as of 11/19/2020.    Patient Active Problem List   Diagnosis Date Noted  . Pulmonary embolism ([place]) 11/15/2019  . Spinal stenosis 11/07/2019  . Weakness of both lower extremities 11/06/2019  . Generalized anxiety disorder 07/24/2018  . Solitary pulmonary nodule on lung CT 06/13/2017  . Spinal stenosis at L4-L5 level   . Dysphagia 05/25/2017  . Tardive dyskinesia 05/25/2017  . Dyspnea on exertion 05/25/2017  . Fall 05/25/2017  .  Bilateral leg pain   . Chronic pain of right knee 03/25/2017  . High risk medication use 03/25/2017  . Family hx of colon cancer 03/25/2017  . Occupational exposure in workplace 03/25/2017  . Depressed bipolar I disorder in partial remission ([place]) 03/25/2017  . Routine general medical examination at a health care facility 06/04/2014  . Hyperlipidemia with target LDL less than 130 03/20/2014  . Essential hypertension, benign 03/20/2014  .  Unspecified constipation 03/20/2014  . Light headedness 03/20/2014  . Acute on chronic kidney disease, stage 3 03/20/2014  . Hypokalemia 03/20/2014  . Encounter for therapeutic drug monitoring 03/20/2014  . Other dysphagia 12/26/2013  . GERD (gastroesophageal reflux disease) 12/21/2013  . Constipation 12/21/2013  . Syncope 12/10/2013  . Leukocytosis 12/10/2013  . URI (upper respiratory infection) 12/10/2013  . Bipolar 1 disorder ([place])   . Obesity (BMI 30-39.9)   . Hearing loss in left ear   . Vertigo   . HYPERCHOLESTEROLEMIA 10/08/2010  . DEPRESSION 10/08/2010  . HYPERTENSION 10/08/2010  . ALLERGIC RHINITIS 10/08/2010    Conditions to be addressed/monitored: HTN and Hyperlipidemia; Transportation  Care Plan : Social Work Care Plan  Updates made by Daneen Schick since 11/19/2020 12:00 AM    Problem: Care Coordination   Priority: Low    Long-Range Goal: Collaborate with RN Care Manager to perform appropriate assessments to assist with care coordination needs   Start Date: 10/08/2020  Expected End Date: 02/05/2021  Recent Progress: On track  Priority: Low  Note:   Current Barriers:  . Limited social support . Transportation . Chronic conditions including HTN, Mixed Hyperlipidemia, Hypokalemia, and Tardive Dyskinesia which put patient at increased risk of hospitalization  Social Work Clinical Goal(s):  [name] Over the next 120 days, patient will work with SW to address concerns related to care coordination  Interventions: . 1:1 collaboration with [name], [place] regarding development and update of comprehensive plan of care as evidenced by provider attestation and co-signature . Inter-disciplinary care team collaboration (see longitudinal plan of care) . Successful outbound call placed to the patient to assess for care coordination needs . Confirmed receipt of mailed resource . Reviewed how to access transportation benefit under health plan . Determined the patient has no  acute transportation needs at this time . Scheduled follow up call to the patient over the next 90 days  Patient Goals/Self-Care Activities Over the next 90 days, patient will:   - Patient will self administer medications as prescribed Patient will attend all scheduled provider appointments Patient will call provider office for new concerns or questions Contact SW as needed prior to next scheduled call Access health plan transportation benefit as needed for upcomming appointments  Follow up Plan: SW will follow up with patient by phone over the next 90 days       Follow Up Plan: SW will follow up with patient by phone over the next 90 days.      Daneen Schick, BSW, CDP Social Worker, Certified Dementia Practitioner [place] [facility] [place] [facility] [phone]  Total time spent performing care coordination and/or care management activities with the patient by phone or face to face = 15 minutes.

## 2020-11-19 NOTE — Patient Instructions (Signed)
Goals we discussed today:  Goals Addressed            This Visit's Progress   . Work with SW to manage care coordination needs       Timeframe:  Long-Range Goal Priority:  Low Start Date:    12.29.21                         Expected End Date:   4.28.22                    Next planned date of contact: 4.26.22  Patient Goals/Self-Care Activities Over the next 90 days, patient will:   - Patient will self administer medications as prescribed Patient will attend all scheduled provider appointments Patient will call provider office for new concerns or questions Contact SW as needed prior to next scheduled call Access health plan transportation benefit as needed for upcomming appointments

## 2020-11-20 ENCOUNTER — Ambulatory Visit (INDEPENDENT_AMBULATORY_CARE_PROVIDER_SITE_OTHER): Payer: Medicare HMO

## 2020-11-20 VITALS — Ht 69.0 in | Wt 165.0 lb

## 2020-11-20 DIAGNOSIS — Z23 Encounter for immunization: Secondary | ICD-10-CM | POA: Diagnosis not present

## 2020-11-20 DIAGNOSIS — Z0000 Encounter for general adult medical examination without abnormal findings: Secondary | ICD-10-CM

## 2020-11-20 MED ORDER — PREVNAR 13 IM SUSP: 0.5000 mL | INTRAMUSCULAR | 0 refills | Status: AC

## 2020-11-20 NOTE — Progress Notes (Signed)
I connected with Judy [name] today by telephone and verified that I am speaking with the correct person using two identifiers. Location patient: home Location provider: work Persons participating in the virtual visit: Judy [name], Judy Durand LPN.   I discussed the limitations, risks, security and privacy concerns of performing an evaluation and management service by telephone and the availability of in person appointments. I also discussed with the patient that there may be a patient responsible charge related to this service. The patient expressed understanding and verbally consented to this telephonic visit.    Interactive audio and video telecommunications were attempted between this provider and patient, however failed, due to patient having technical difficulties OR patient did not have access to video capability.  We continued and completed visit with audio only.     Vital signs may be patient reported or missing.  Subjective:   Judy [name] is a 75 y.o. female who presents for Medicare Annual (Subsequent) preventive examination.  Review of Systems     Cardiac Risk Factors include: advanced age (>58men, >17 women);dyslipidemia;hypertension;sedentary lifestyle     Objective:    Today's Vitals   11/20/20 1357 11/20/20 1358  Weight: 165 lb (74.8 kg)   Height: $Remove'5\' 9"'YhRPdtv$  (1.753 m)   PainSc:  5    Body mass index is 24.37 kg/m.  Advanced Directives 11/20/2020 11/15/2019 11/06/2019 09/18/2019 07/31/2019 06/14/2019 06/13/2019  Does Patient Have a Medical Advance Directive? Yes No No Yes No No No  Type of Paramedic of [place];Out of facility DNR (pink MOST or yellow form) - - [place]  Does patient want to make changes to medical advance directive? - - - - - - -  Copy of [place] in Chart? Yes - validated most recent copy scanned in chart (See row information) - - Yes - validated most recent copy scanned in chart  (See row information) - - -  Would patient like information on creating a medical advance directive? - No - Patient declined Yes (ED - Information included in AVS) - Yes (MAU/Ambulatory/Procedural Areas - Information given) No - Patient declined -  Pre-existing out of facility DNR order (yellow form or pink MOST form) - - - - - - -    Current Medications (verified) Outpatient Encounter Medications as of 11/20/2020  Medication Sig  . acetaminophen (TYLENOL) 325 MG tablet Take 2 tablets (650 mg total) by mouth every 6 (six) hours as needed for mild pain (or Fever >/= 101).  [name] buPROPion (WELLBUTRIN XL) 150 MG 24 hr tablet Take 1 tablet (150 mg total) by mouth daily.  [name] docusate sodium (COLACE) 100 MG capsule Take 100 mg by mouth 2 (two) times daily.  [name] gabapentin (NEURONTIN) 300 MG capsule Take 1 capsule (300 mg total) by mouth 3 (three) times daily.  . meclizine (ANTIVERT) 25 MG tablet Take 1 tablet (25 mg total) by mouth 3 (three) times daily as needed for dizziness.  . Multiple Vitamin (DAILY VITE PO) Take 1 tablet by mouth daily.  . pneumococcal 13-valent conjugate vaccine (PREVNAR 13) SUSP injection Inject 0.5 mLs into the muscle tomorrow at 10 am for 1 dose.  [place] Tosylate (INGREZZA) 40 MG CAPS Take 40 mg by mouth daily.  . Vitamin D, Ergocalciferol, (DRISDOL) 1.25 MG (50000 UNIT) CAPS capsule Take 1 capsule (50,000 Units total) by mouth every 7 (seven) days.  [name] amLODipine (NORVASC) 5 MG tablet TAKE 1 TABLET (5 MG TOTAL) BY  MOUTH DAILY. PATIENT NEEDS AN APPOINTMENT (Patient not taking: Reported on 11/20/2020)  . apixaban (ELIQUIS) 5 MG TABS tablet Take 1-2 tablets (5-10 mg total) by mouth 2 (two) times daily. Take 2 tablets ($RemoveBe'10mg'FUIHDjIdZ$ ) daily 2 times daily through 11/24/2019, then starting 11/25/2019 start taking 1 tablet ($RemoveB'5mg'mIpZbmKW$ ) 2 times daily. (Patient not taking: Reported on 11/20/2020)  . atorvastatin (LIPITOR) 10 MG tablet Take 1 tablet (10 mg total) by mouth daily. (Patient not taking:  Reported on 11/20/2020)  . Lurasidone HCl (LATUDA) 120 MG TABS Take 1 tablet (120 mg total) by mouth at bedtime. (Patient not taking: Reported on 11/20/2020)  . traMADol (ULTRAM) 50 MG tablet Take 1 tablet (50 mg total) by mouth every 6 (six) hours as needed for moderate pain. (Patient not taking: Reported on 11/20/2020)   No facility-administered encounter medications on file as of 11/20/2020.    Allergies (verified) Patient has no known allergies.   History: Past Medical History:  Diagnosis Date  . Allergic rhinitis   . Anemia   . Anxiety   . Arthritis   . Bipolar 1 disorder ([place])   . Depression   . Diverticulosis of colon (without mention of hemorrhage) 08/25/2011   [name]  . 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 Hyperlipidemia   . Hypertension   . Obesity   . Renal disorder   . Vertigo    Past Surgical History:  Procedure Laterality Date  . COLONOSCOPY  08/25/2011   Procedure: COLONOSCOPY;  Surgeon: Landry Dyke, MD;  Location: [facility];  Service: Endoscopy;  Laterality: N/A;  . DILATION AND CURETTAGE OF UTERUS  1960's   Family History  Problem Relation Age of Onset  . Prostate cancer Brother   . Hypertension Brother   . Kidney disease Brother   . Colon cancer Brother   . Hypertension Mother   . Kidney disease Mother   . Stomach cancer Father   . Hyperlipidemia Sister   . Hypothyroidism Sister   . Stroke Brother   . Prostate cancer Brother    Social History   Socioeconomic History  . Marital status: Single    Spouse name: Not on file  . Number of children: 1  . Years of education: 15  . Highest education level: Not on file  Occupational History  . Occupation: Retired  Tobacco Use  . Smoking status: Never Smoker  . Smokeless tobacco: Never Used  Vaping Use  . Vaping Use: Never used  Substance and Sexual Activity  . Alcohol use: No  . Drug use: No  . Sexual activity: Not Currently  Other Topics Concern  . Not on file  Social History  Narrative   Lives alone in house, does not need assist device.  Works part time at [employer]. Has a son.     Cousin and sister in close proximity   Right handed   2 cups coffee per day   Social Determinants of Health   Financial Resource Strain: Low Risk   . Difficulty of Paying Living Expenses: Not hard at all  Food Insecurity: No Food Insecurity  . Worried About Charity fundraiser in the Last Year: Never true  . Ran Out of Food in the Last Year: Never true  Transportation Needs: No Transportation Needs  . Lack of Transportation (Medical): No  . Lack of Transportation (Non-Medical): No  Physical Activity: Insufficiently Active  . Days of Exercise per Week: 2 days  . Minutes  of Exercise per Session: 60 min  Stress: No Stress Concern Present  . Feeling of Stress : Not at all  Social Connections: Not on file    Tobacco Counseling Counseling given: Not Answered   Clinical Intake:  Pre-visit preparation completed: Yes  Pain : 0-10 Pain Score: 5  Pain Type: Chronic pain Pain Location: Leg Pain Orientation: Right,Left Pain Descriptors / Indicators: Aching Pain Onset: More than a month ago Pain Frequency: Intermittent     Nutritional Status: BMI of 19-24  Normal Nutritional Risks: None Diabetes: No  How often do you need to have someone help you when you read instructions, pamphlets, or other written materials from your doctor or pharmacy?: 1 - Never What is the last grade level you completed in school?: 12th grade  Diabetic? no  Interpreter Needed?: No  Information entered by :: NAllen LPN   Activities of Daily Living In your present state of health, do you have any difficulty performing the following activities: 11/20/2020  Hearing? Y  Vision? N  Difficulty concentrating or making decisions? N  Walking or climbing stairs? Y  Dressing or bathing? N  Doing errands, shopping? N  Preparing Food and eating ? N  Using the Toilet? N  In the past  six months, have you accidently leaked urine? Y  Comment at night time  Do you have problems with loss of bowel control? N  Managing your Medications? N  Managing your Finances? N  Housekeeping or managing your Housekeeping? N  Some recent data might be hidden    Patient Care Team: [name], FNP as PCP - General (General Practice) Kristeen Miss, MD as Consulting Physician (Neurosurgery) Cottle, Billey Co., MD as Attending Physician (Psychiatry) Daneen Schick as Social Worker Little, Claudette Stapler, RN as Case Manager  Indicate any recent Medical Services you may have received from other than [facility] providers in the past year (date may be approximate).     Assessment:   This is a routine wellness examination for [place].  Hearing/Vision screen  Hearing Screening   '125Hz'$  $Remo'250Hz'AHrDY$'500Hz'$'1000Hz'$'2000Hz'$'3000Hz'$'4000Hz'$'6000Hz'$'8000Hz'$   Right ear:           Left ear:           Vision Screening Comments: No regular eye exams  Dietary issues and exercise activities discussed: Current Exercise Habits: Home exercise routine, Type of exercise: walking, Time (Minutes): 60, Frequency (Times/Week): 2, Weekly Exercise (Minutes/Week): 120  Goals    .  "I am not taking anything for my cholesterol" (pt-stated)      Current Barriers:  [name] Knowledge Deficits related to diagnosis and treatment of Hyperlipidemia . Chronic Disease Management support and education needs related to HTN, Mixed Hyperlipidemia, Hypokalemia, Tardive dyskinesia  Nurse Case Manager Clinical Goal(s):  [name] Over the next 30 days, patient will work with PCP and embedded Pharm D to address needs related to pharmacological management of Hyperlipidemia Goal Met . 05/26/20 New Over the next 90 days, patient will verbalize basic understanding of Hyperlipidemia disease process and self health management plan as evidenced by patient will lower her total Cholesterol and LDL target range   CCM RN CM Interventions:  05/26/20 call completed with  patient . Evaluation of current treatment plan related to Hyperlipidemia and patient's adherence to plan as established by provider . Reviewed and discussed patient's total Cholesterol obtained on 10/30/19 was 240, LDL 143; Educated on target ranges; Educated on dietary and exercise recommendations . Determined patient is adhering with  her medication regimen for treatment of Hyperlipidemia; Determined patient is taking Atorvastatin 10 mg qd started on 01/30/20; Educated on importance of taking this medication exactly as prescribed w/o missed doses and to implement diet and exercise changes as discussed  . Discussed plans with patient for ongoing care management follow up and provided patient with direct contact information for care management team  Patient Self Care Activities:  . Self administers medications as prescribed . Attends all scheduled provider appointments . Calls pharmacy for medication refills . Performs ADL's independently . Calls provider office for new concerns or questions . Unable to perform IADLs independently  Please see past updates related to this goal by clicking on the "Past Updates" button in the selected goal      .  "I need help getting physical therapy" (pt-stated)      CARE PLAN ENTRY (see longitudinal plan of care for additional care plan information)  Current Barriers:  [name] Knowledge Deficits related to disease process and Self Health management of Tardive Dyskinesia  . Chronic Disease Management support and education needs related to HTN, Mixed Hyperlipidemia, Hypokalemia, Tardive dyskinesia  Nurse Case Manager Clinical Goal(s):  [name] Over the next 90 days, patient will work with the CCM team and PCP to address needs related to evaluation and treatment of impaired physical mobility and the inability to perform ADL's/iADL's  CCM RN CM Interventions:  07/01/20 Collaborative call with [facility] nurse . Inter-disciplinary care team collaboration (see  longitudinal plan of care) . Evaluation of current treatment plan related to Tardive Dyskinesia and patient's adherence to plan as established by provider . Collaborative call completed with [facility] nurse Larena Glassman RN to confirm patient has been admitted into [facility] for clinical support . Discussed [facility] will be performing PT/OT to assist with mobility, strengthening and motor deficits secondary to Tardive Dyskinesia . Discussed Larena Glassman RN will focus on medication management with patient at next home visit due to patient's current home system may need improvement,  she will discuss transitioning to [facility]  Patient Self Care Activities:  . Self administers medications as prescribed . Attends all scheduled provider appointments . Calls pharmacy for medication refills . Calls provider office for new concerns or questions  Please see past updates related to this goal by clicking on the "Past Updates" button in the selected goal      .  Exercise 3x per week (30 min per time)      09/18/2019, would like to work up to 30 minutes per session    .  Patient Stated      11/20/2020, wants to weigh 150 pounds    .  Work with SW to manage care coordination needs      Timeframe:  Long-Range Goal Priority:  Low Start Date:    12.29.21                         Expected End Date:   4.28.22                    Next planned date of contact: 4.26.22  Patient Goals/Self-Care Activities Over the next 90 days, patient will:   - Patient will self administer medications as prescribed Patient will attend all scheduled provider appointments Patient will call provider office for new concerns or questions Contact SW as needed prior to next scheduled call Access health plan transportation benefit as needed for upcomming appointments  Depression Screen PHQ 2/9 Scores 11/20/2020 09/18/2019 07/02/2019 02/26/2019 07/24/2018 07/11/2018 07/06/2017  PHQ - 2 Score 0 0 6 0 1 4 4   PHQ- 9 Score -  2 23 - 2 6 11     Fall Risk Fall Risk  11/20/2020 09/18/2019 07/02/2019 02/26/2019 07/24/2018  Falls in the past year? 0 0 1 0 Yes  Number falls in past yr: - - 1 - 1  Injury with Fall? - - 0 - No  Comment - - - - -  Risk for fall due to : Impaired mobility;Medication side effect;Impaired balance/gait Medication side effect - - -  Follow up Falls evaluation completed;Education provided;Falls prevention discussed Falls evaluation completed;Education provided;Falls prevention discussed - - -    FALL RISK PREVENTION PERTAINING TO THE HOME:  Any stairs in or around the home? No  If so, are there any without handrails? n/a Home free of loose throw rugs in walkways, pet beds, electrical cords, etc? Yes  Adequate lighting in your home to reduce risk of falls? Yes   ASSISTIVE DEVICES UTILIZED TO PREVENT FALLS:  Life alert? No  Use of a cane, walker or w/c? Yes  Grab bars in the bathroom? Yes  Shower chair or bench in shower? Yes  Elevated toilet seat or a handicapped toilet? No   TIMED UP AND GO:  Was the test performed? No . .     Cognitive Function: MMSE - Mini Mental State Exam 07/06/2017 03/25/2017 06/04/2014  Orientation to time 5 5 5   Orientation to Place 5 5 5   Registration 3 3 3   Attention/ Calculation 4 4 5   Recall 1 2 2   Language- name 2 objects 2 2 2   Language- repeat 1 1 1   Language- follow 3 step command 3 3 3   Language- read & follow direction 1 1 1   Write a sentence 1 1 1   Copy design 1 0 0  Total score 27 27 28      6CIT Screen 11/20/2020 09/18/2019  What Year? 0 points 0 points  What month? 0 points 0 points  What time? 0 points 0 points  Count back from 20 0 points 0 points  Months in reverse 4 points 0 points  Repeat phrase 4 points 0 points  Total Score 8 0    Immunizations Immunization History  Administered Date(s) Administered  . Influenza, High Dose Seasonal PF 07/24/2018, 07/02/2019  . Influenza,inj,Quad PF,6+ Mos 12/11/2013, 09/12/2015, 07/20/2016,  08/10/2017  . PFIZER(Purple Top)SARS-COV-2 Vaccination 12/24/2019, 01/15/2020, 10/09/2020  . Pneumococcal Polysaccharide-23 12/11/2013, 11/08/2019    TDAP status: Due, Education has been provided regarding the importance of this vaccine. Advised may receive this vaccine at local pharmacy or Health Dept. Aware to provide a copy of the vaccination record if obtained from local pharmacy or Health Dept. Verbalized acceptance and understanding.  Flu Vaccine status: Due, Education has been provided regarding the importance of this vaccine. Advised may receive this vaccine at local pharmacy or Health Dept. Aware to provide a copy of the vaccination record if obtained from local pharmacy or Health Dept. Verbalized acceptance and understanding.  Pneumococcal vaccine status: sent to pharmacy  Covid-19 vaccine status: Completed vaccines  Qualifies for Shingles Vaccine? Yes   Zostavax completed No   Shingrix Completed?: No.    Education has been provided regarding the importance of this vaccine. Patient has been advised to call insurance company to determine out of pocket expense if they have not yet received this vaccine. Advised may also receive vaccine at local pharmacy  or Health Dept. Verbalized acceptance and understanding.  Screening Tests Health Maintenance  Topic Date Due  . TETANUS/TDAP  Never done  . MAMMOGRAM  11/09/2019  . INFLUENZA VACCINE  05/11/2020  . PNA vac Low Risk Adult (2 of 2 - PCV13) 11/07/2020  . COLONOSCOPY (Pts 45-30yrs Insurance coverage will need to be confirmed)  08/24/2021  . DEXA SCAN  Completed  . COVID-19 Vaccine  Completed  . Hepatitis C Screening  Completed    Health Maintenance  Health Maintenance Due  Topic Date Due  . TETANUS/TDAP  Never done  . MAMMOGRAM  11/09/2019  . INFLUENZA VACCINE  05/11/2020  . PNA vac Low Risk Adult (2 of 2 - PCV13) 11/07/2020    Colorectal cancer screening: Type of screening: Colonoscopy. Completed 08/25/2011. Repeat every  10 years  Mammogram status: patient to schedule  Bone Density status: Completed 04/06/2013.   Lung Cancer Screening: (Low Dose CT Chest recommended if Age 58-80 years, 30 pack-year currently smoking OR have quit w/in 15years.) does not qualify.   Lung Cancer Screening Referral: no  Additional Screening:  Hepatitis C Screening: does qualify; Completed 02/26/2019  Vision Screening: Recommended annual ophthalmology exams for early detection of glaucoma and other disorders of the eye. Is the patient up to date with their annual eye exam?  No  Who is the provider or what is the name of the office in which the patient attends annual eye exams? none If pt is not established with a provider, would they like to be referred to a provider to establish care? No .   Dental Screening: Recommended annual dental exams for proper oral hygiene  Community Resource Referral / Chronic Care Management: CRR required this visit?  No   CCM required this visit?  No      Plan:     I have personally reviewed and noted the following in the patient's chart:   . Medical and social history . Use of alcohol, tobacco or illicit drugs  . Current medications and supplements . Functional ability and status . Nutritional status . Physical activity . Advanced directives . List of other physicians . Hospitalizations, surgeries, and ER visits in previous 12 months . Vitals . Screenings to include cognitive, depression, and falls . Referrals and appointments  In addition, I have reviewed and discussed with patient certain preventive protocols, quality metrics, and best practice recommendations. A written personalized care plan for preventive services as well as general preventive health recommendations were provided to patient.     Kellie Simmering, LPN   04/16/8674   Nurse Notes:

## 2020-11-20 NOTE — Patient Instructions (Signed)
Judy [name] , Thank you for taking time to come for your Medicare Wellness Visit. I appreciate your ongoing commitment to your health goals. Please review the following plan we discussed and let me know if I can assist you in the future.   Screening recommendations/referrals: Colonoscopy: completed 08/25/2011 Mammogram: patient to schedule Bone Density: completed 04/06/2013 Recommended yearly ophthalmology/optometry visit for glaucoma screening and checkup Recommended yearly dental visit for hygiene and checkup  Vaccinations: Influenza vaccine: due Pneumococcal vaccine: sent to pharmacy Tdap vaccine: due Shingles vaccine: discussed   Covid-19: 12/24/2019, 01/15/2020, 10/09/2020  Advanced directives: copy in chart  Conditions/risks identified: none  Next appointment: Follow up in one year for your annual wellness visit    Preventive Care 14 Years and Older, Female Preventive care refers to lifestyle choices and visits with your health care provider that can promote health and wellness. What does preventive care include?  A yearly physical exam. This is also called an annual well check.  Dental exams once or twice a year.  Routine eye exams. Ask your health care provider how often you should have your eyes checked.  Personal lifestyle choices, including:  Daily care of your teeth and gums.  Regular physical activity.  Eating a healthy diet.  Avoiding tobacco and drug use.  Limiting alcohol use.  Practicing safe sex.  Taking low-dose aspirin every day.  Taking vitamin and mineral supplements as recommended by your health care provider. What happens during an annual well check? The services and screenings done by your health care provider during your annual well check will depend on your age, overall health, lifestyle risk factors, and family history of disease. Counseling  Your health care provider may ask you questions about your:  Alcohol use.  Tobacco use.  Drug  use.  Emotional well-being.  Home and relationship well-being.  Sexual activity.  Eating habits.  History of falls.  Memory and ability to understand (cognition).  Work and work Statistician.  Reproductive health. Screening  You may have the following tests or measurements:  Height, weight, and BMI.  Blood pressure.  Lipid and cholesterol levels. These may be checked every 5 years, or more frequently if you are over 4 years old.  Skin check.  Lung cancer screening. You may have this screening every year starting at age 58 if you have a 30-pack-year history of smoking and currently smoke or have quit within the past 15 years.  Fecal occult blood test (FOBT) of the stool. You may have this test every year starting at age 36.  Flexible sigmoidoscopy or colonoscopy. You may have a sigmoidoscopy every 5 years or a colonoscopy every 10 years starting at age 69.  Hepatitis C blood test.  Hepatitis B blood test.  Sexually transmitted disease (STD) testing.  Diabetes screening. This is done by checking your blood sugar (glucose) after you have not eaten for a while (fasting). You may have this done every 1-3 years.  Bone density scan. This is done to screen for osteoporosis. You may have this done starting at age 33.  Mammogram. This may be done every 1-2 years. Talk to your health care provider about how often you should have regular mammograms. Talk with your health care provider about your test results, treatment options, and if necessary, the need for more tests. Vaccines  Your health care provider may recommend certain vaccines, such as:  Influenza vaccine. This is recommended every year.  Tetanus, diphtheria, and acellular pertussis (Tdap, Td) vaccine. You may need a  Td booster every 10 years.  Zoster vaccine. You may need this after age 35.  Pneumococcal 13-valent conjugate (PCV13) vaccine. One dose is recommended after age 49.  Pneumococcal polysaccharide  (PPSV23) vaccine. One dose is recommended after age 66. Talk to your health care provider about which screenings and vaccines you need and how often you need them. This information is not intended to replace advice given to you by your health care provider. Make sure you discuss any questions you have with your health care provider. Document Released: 10/24/2015 Document Revised: 06/16/2016 Document Reviewed: 07/29/2015 Elsevier Interactive Patient Education  2017 [place] Prevention in the Home Falls can cause injuries. They can happen to people of all ages. There are many things you can do to make your home safe and to help prevent falls. What can I do on the outside of my home?  Regularly fix the edges of walkways and driveways and fix any cracks.  Remove anything that might make you trip as you walk through a door, such as a raised step or threshold.  Trim any bushes or trees on the path to your home.  Use bright outdoor lighting.  Clear any walking paths of anything that might make someone trip, such as rocks or tools.  Regularly check to see if handrails are loose or broken. Make sure that both sides of any steps have handrails.  Any raised decks and porches should have guardrails on the edges.  Have any leaves, snow, or ice cleared regularly.  Use sand or salt on walking paths during winter.  Clean up any spills in your garage right away. This includes oil or grease spills. What can I do in the bathroom?  Use night lights.  Install grab bars by the toilet and in the tub and shower. Do not use towel bars as grab bars.  Use non-skid mats or decals in the tub or shower.  If you need to sit down in the shower, use a plastic, non-slip stool.  Keep the floor dry. Clean up any water that spills on the floor as soon as it happens.  Remove soap buildup in the tub or shower regularly.  Attach bath mats securely with double-sided non-slip rug tape.  Do not have  throw rugs and other things on the floor that can make you trip. What can I do in the bedroom?  Use night lights.  Make sure that you have a light by your bed that is easy to reach.  Do not use any sheets or blankets that are too big for your bed. They should not hang down onto the floor.  Have a firm chair that has side arms. You can use this for support while you get dressed.  Do not have throw rugs and other things on the floor that can make you trip. What can I do in the kitchen?  Clean up any spills right away.  Avoid walking on wet floors.  Keep items that you use a lot in easy-to-reach places.  If you need to reach something above you, use a strong step stool that has a grab bar.  Keep electrical cords out of the way.  Do not use floor polish or wax that makes floors slippery. If you must use wax, use non-skid floor wax.  Do not have throw rugs and other things on the floor that can make you trip. What can I do with my stairs?  Do not leave any items on the stairs.  Make sure that there are handrails on both sides of the stairs and use them. Fix handrails that are broken or loose. Make sure that handrails are as long as the stairways.  Check any carpeting to make sure that it is firmly attached to the stairs. Fix any carpet that is loose or worn.  Avoid having throw rugs at the top or bottom of the stairs. If you do have throw rugs, attach them to the floor with carpet tape.  Make sure that you have a light switch at the top of the stairs and the bottom of the stairs. If you do not have them, ask someone to add them for you. What else can I do to help prevent falls?  Wear shoes that:  Do not have high heels.  Have rubber bottoms.  Are comfortable and fit you well.  Are closed at the toe. Do not wear sandals.  If you use a stepladder:  Make sure that it is fully opened. Do not climb a closed stepladder.  Make sure that both sides of the stepladder are  locked into place.  Ask someone to hold it for you, if possible.  Clearly mark and make sure that you can see:  Any grab bars or handrails.  First and last steps.  Where the edge of each step is.  Use tools that help you move around (mobility aids) if they are needed. These include:  Canes.  Walkers.  Scooters.  Crutches.  Turn on the lights when you go into a dark area. Replace any light bulbs as soon as they burn out.  Set up your furniture so you have a clear path. Avoid moving your furniture around.  If any of your floors are uneven, fix them.  If there are any pets around you, be aware of where they are.  Review your medicines with your doctor. Some medicines can make you feel dizzy. This can increase your chance of falling. Ask your doctor what other things that you can do to help prevent falls. This information is not intended to replace advice given to you by your health care provider. Make sure you discuss any questions you have with your health care provider. Document Released: 07/24/2009 Document Revised: 03/04/2016 Document Reviewed: 11/01/2014 Elsevier Interactive Patient Education  2017 [employer].

## 2020-11-26 ENCOUNTER — Other Ambulatory Visit: Payer: Self-pay

## 2020-12-02 ENCOUNTER — Other Ambulatory Visit: Payer: Self-pay

## 2020-12-02 ENCOUNTER — Ambulatory Visit (INDEPENDENT_AMBULATORY_CARE_PROVIDER_SITE_OTHER): Payer: Medicare HMO | Admitting: Physician Assistant

## 2020-12-02 ENCOUNTER — Encounter: Payer: Self-pay | Admitting: Physician Assistant

## 2020-12-02 DIAGNOSIS — F411 Generalized anxiety disorder: Secondary | ICD-10-CM | POA: Diagnosis not present

## 2020-12-02 DIAGNOSIS — F3130 Bipolar disorder, current episode depressed, mild or moderate severity, unspecified: Secondary | ICD-10-CM

## 2020-12-02 DIAGNOSIS — G2401 Drug induced subacute dyskinesia: Secondary | ICD-10-CM | POA: Diagnosis not present

## 2020-12-02 MED ORDER — GABAPENTIN 400 MG PO CAPS: 400.0000 mg | ORAL_CAPSULE | Freq: Three times a day (TID) | ORAL | 1 refills | Status: DC

## 2020-12-02 NOTE — Progress Notes (Signed)
[facility]  Patient ID: Judy [name],  MRN: [id number]  PCP: [name], FNP  Date of Evaluation: 12/02/2020 Time spent:40 minutes  Chief Complaint:  Chief Complaint    Depression      HISTORY/CURRENT STATUS: HPI having problems with her nerves.  Judy [name] has been lost to follow-up for over a year.  She presents today with having "bad nerves."  States she is nervous a lot of the time.  Her legs shake and she feels jittery inside like something bad is going to happen anytime.  Not having panic attacks but more of a generalized sense of unease daily.  States she also feels sad a lot for no known reason.  Does not really enjoying much except for going outside and walking around her apartment complex.  That is difficult now because she has balance issues.  She uses a Rollator or her walking stick.  No longer driving.  She does not cry easily.  Energy and motivation are low but feels that is due to the physical issues.  Denies suicidal or homicidal thoughts.  Patient denies increased energy with decreased need for sleep, no increased talkativeness, no racing thoughts, no impulsivity or risky behaviors, no increased spending, no increased libido, no grandiosity, no increased irritability or anger, no paranoia, and no hallucinations.  She has been on Ingrezza for approximately 1-1/2 years.  States it helped her at first but has not helped in a long time, reports pursing her lips a lot and sticking her tongue out, that is uncontrollable.  Also her legs shake.  She has been off of an antipsychotic for over a year, yet still has these symptoms.  Individual Medical History/ Review of Systems: Changes? :Yes  PE 11/15/2019, gets physical therapy through home health care.  Past medications for mental health diagnoses include: Latuda, Seroquel, Prozac, Fetzima, Risperdal, gabapentin, Wellbutrin  Allergies: Patient has no known allergies.  Current Medications:  Current Outpatient  Medications:  .  acetaminophen (TYLENOL) 325 MG tablet, Take 2 tablets (650 mg total) by mouth every 6 (six) hours as needed for mild pain (or Fever >/= 101)., Disp:  , Rfl:  .  atorvastatin (LIPITOR) 10 MG tablet, Take 1 tablet (10 mg total) by mouth daily., Disp: 30 tablet, Rfl: 3 .  buPROPion (WELLBUTRIN XL) 150 MG 24 hr tablet, Take 1 tablet (150 mg total) by mouth daily., Disp: 90 tablet, Rfl: 0 .  gabapentin (NEURONTIN) 400 MG capsule, Take 1 capsule (400 mg total) by mouth 3 (three) times daily., Disp: 90 capsule, Rfl: 1 .  Multiple Vitamin (DAILY VITE PO), Take 1 tablet by mouth daily., Disp: , Rfl:  .  Valbenazine Tosylate (INGREZZA) 80 MG CAPS, Take 80 mg by mouth daily., Disp: 30 capsule, Rfl: 11 .  Vitamin D, Ergocalciferol, (DRISDOL) 1.25 MG (50000 UNIT) CAPS capsule, Take 1 capsule (50,000 Units total) by mouth every 7 (seven) days., Disp: 12 capsule, Rfl: 0 .  amLODipine (NORVASC) 5 MG tablet, TAKE 1 TABLET (5 MG TOTAL) BY MOUTH DAILY. PATIENT NEEDS AN APPOINTMENT (Patient not taking: Reported on 11/20/2020), Disp: 30 tablet, Rfl: 0 .  apixaban (ELIQUIS) 5 MG TABS tablet, Take 1-2 tablets (5-10 mg total) by mouth 2 (two) times daily. Take 2 tablets (10mg ) daily 2 times daily through 11/24/2019, then starting 11/25/2019 start taking 1 tablet (5mg ) 2 times daily. (Patient not taking: Reported on 11/20/2020), Disp: 60 tablet, Rfl: 2 .  docusate sodium (COLACE) 100 MG capsule, Take 100 mg by mouth 2 (  two) times daily. (Patient not taking: Reported on 12/02/2020), Disp: , Rfl:  .  meclizine (ANTIVERT) 25 MG tablet, Take 1 tablet (25 mg total) by mouth 3 (three) times daily as needed for dizziness. (Patient not taking: Reported on 12/02/2020), Disp: 15 tablet, Rfl: 0 .  traMADol (ULTRAM) 50 MG tablet, Take 1 tablet (50 mg total) by mouth every 6 (six) hours as needed for moderate pain. (Patient not taking: No sig reported), Disp: 6 tablet, Rfl: 0 Medication Side Effects: none  Family Medical/  Social History: Changes? No  MENTAL HEALTH EXAM:  There were no vitals taken for this visit.There is no height or weight on file to calculate BMI.  General Appearance: Casual and Well Groomed  Eye Contact:  Good  Speech:  Clear and Coherent and Normal Rate  Volume:  Normal  Mood:  Euthymic  Affect:  Flat  Thought Process:  Goal Directed and Descriptions of Associations: Intact  Orientation:  Full (Time, Place, and Person)  Thought Content: Logical   Suicidal Thoughts:  No  Homicidal Thoughts:  No  Memory:  Immediate;   Fair Recent;   Fair This has not worsened over the past several years and seems to be age-related.  Judgement:  Good  Insight:  Good  Psychomotor Activity:  TD, Wide Base and bounces legs during visit and clenches her teeth, purses her lips at times.  No movements of the tongue observed.  Concentration:  Concentration: Good  Recall:  Good  Fund of Knowledge: Good  Language: Good  Assets:  Desire for Improvement  ADL's:  Intact  Cognition: WNL  Prognosis:  Good    DIAGNOSES:    ICD-10-CM   1. Tardive dyskinesia  G24.01   2. Generalized anxiety disorder  F41.1   3. Bipolar I disorder, most recent episode depressed ([place])  F31.30     Receiving Psychotherapy: No    RECOMMENDATIONS:  PDMP was reviewed. I provided 40 minutes of face-to-face time during this encounter, including time spent before and after the visit reviewing records, and counseling her on medication and appointment compliance.  Recommend increasing the Ingrezza.  Due to gait disturbance, benzodiazepines are out of the question at this point.  She is already on a high fall risk.  Recommend increasing the gabapentin which can be very help anxiety. Increase gabapentin to 400 mg p.o. 3 times daily. Continue Wellbutrin XL 150 mg, 1 p.o. daily. Increase Ingrezza to 80 mg. Recommend counseling. Return in 6 weeks.  Donnal Moat, PA-C

## 2020-12-06 MED ORDER — INGREZZA 80 MG PO CAPS: 80.0000 mg | ORAL_CAPSULE | Freq: Every day | ORAL | 11 refills | Status: DC

## 2020-12-11 ENCOUNTER — Other Ambulatory Visit: Payer: Self-pay | Admitting: Physician Assistant

## 2020-12-11 ENCOUNTER — Telehealth: Payer: Self-pay | Admitting: Physician Assistant

## 2020-12-11 MED ORDER — BUPROPION HCL ER (XL) 150 MG PO TB24: 150.0000 mg | ORAL_TABLET | Freq: Every day | ORAL | 0 refills | Status: DC

## 2020-12-11 MED ORDER — GABAPENTIN 400 MG PO CAPS: 400.0000 mg | ORAL_CAPSULE | Freq: Three times a day (TID) | ORAL | 1 refills | Status: DC

## 2020-12-11 NOTE — Telephone Encounter (Signed)
Judy [name] has changed insurance from [facility] to [employer]. [name] mail order pharmacy has changed from [place] to [facility] order pharmacy.  Please send prescriptions for her medications to the new pharmacy and cancel from [facility]

## 2020-12-11 NOTE — Telephone Encounter (Signed)
Prescriptions were sent

## 2020-12-15 ENCOUNTER — Telehealth: Payer: Medicare HMO

## 2020-12-15 ENCOUNTER — Ambulatory Visit (INDEPENDENT_AMBULATORY_CARE_PROVIDER_SITE_OTHER): Payer: Medicare HMO

## 2020-12-15 DIAGNOSIS — E782 Mixed hyperlipidemia: Secondary | ICD-10-CM | POA: Diagnosis not present

## 2020-12-15 DIAGNOSIS — G2401 Drug induced subacute dyskinesia: Secondary | ICD-10-CM

## 2020-12-15 DIAGNOSIS — E876 Hypokalemia: Secondary | ICD-10-CM

## 2020-12-15 DIAGNOSIS — I10 Essential (primary) hypertension: Secondary | ICD-10-CM

## 2020-12-18 NOTE — Progress Notes (Signed)
Chronic Care Management   CCM RN Visit Note 12/15/2020 Name: Judy [name] MRN: [id number] DOB: [birth year]  Subjective: Judy [name] is a 75 y.o. year old female who is a primary care patient of [name], [place]. The care management team was consulted for assistance with disease management and care coordination needs.    Engaged with patient by telephone for follow up visit in response to provider referral for case management and/or care coordination services.   Consent to Services:  The patient was given information about Chronic Care Management services, agreed to services, and gave verbal consent prior to initiation of services.  Please see initial visit note for detailed documentation.   Patient agreed to services and verbal consent obtained.   Assessment: Review of patient past medical history, allergies, medications, health status, including review of consultants reports, laboratory and other test data, was performed as part of comprehensive evaluation and provision of chronic care management services.   SDOH (Social Determinants of Health) assessments and interventions performed:  Yes, no acute challenges   CCM Care Plan  No Known Allergies  Outpatient Encounter Medications as of 12/15/2020  Medication Sig  . acetaminophen (TYLENOL) 325 MG tablet Take 2 tablets (650 mg total) by mouth every 6 (six) hours as needed for mild pain (or Fever >/= 101).  [name] amLODipine (NORVASC) 5 MG tablet TAKE 1 TABLET (5 MG TOTAL) BY MOUTH DAILY. PATIENT NEEDS AN APPOINTMENT (Patient not taking: Reported on 11/20/2020)  . apixaban (ELIQUIS) 5 MG TABS tablet Take 1-2 tablets (5-10 mg total) by mouth 2 (two) times daily. Take 2 tablets (10mg ) daily 2 times daily through 11/24/2019, then starting 11/25/2019 start taking 1 tablet (5mg ) 2 times daily. (Patient not taking: Reported on 11/20/2020)  . atorvastatin (LIPITOR) 10 MG tablet Take 1 tablet (10 mg total) by mouth daily.  [name] buPROPion (WELLBUTRIN XL)  150 MG 24 hr tablet Take 1 tablet (150 mg total) by mouth daily.  [name] docusate sodium (COLACE) 100 MG capsule Take 100 mg by mouth 2 (two) times daily. (Patient not taking: Reported on 12/02/2020)  . gabapentin (NEURONTIN) 400 MG capsule Take 1 capsule (400 mg total) by mouth 3 (three) times daily.  . meclizine (ANTIVERT) 25 MG tablet Take 1 tablet (25 mg total) by mouth 3 (three) times daily as needed for dizziness. (Patient not taking: Reported on 12/02/2020)  . Multiple Vitamin (DAILY VITE PO) Take 1 tablet by mouth daily.  . traMADol (ULTRAM) 50 MG tablet Take 1 tablet (50 mg total) by mouth every 6 (six) hours as needed for moderate pain. (Patient not taking: No sig reported)  . Valbenazine Tosylate (INGREZZA) 80 MG CAPS Take 80 mg by mouth daily.  . Vitamin D, Ergocalciferol, (DRISDOL) 1.25 MG (50000 UNIT) CAPS capsule Take 1 capsule (50,000 Units total) by mouth every 7 (seven) days.   No facility-administered encounter medications on file as of 12/15/2020.    Patient Active Problem List   Diagnosis Date Noted  . Pulmonary embolism ([place]) 11/15/2019  . Spinal stenosis 11/07/2019  . Weakness of both lower extremities 11/06/2019  . Generalized anxiety disorder 07/24/2018  . Solitary pulmonary nodule on lung CT 06/13/2017  . Spinal stenosis at L4-L5 level   . Dysphagia 05/25/2017  . Tardive dyskinesia 05/25/2017  . Dyspnea on exertion 05/25/2017  . Fall 05/25/2017  . Bilateral leg pain   . Chronic pain of right knee 03/25/2017  . High risk medication use 03/25/2017  . Family hx of colon cancer 03/25/2017  .  Occupational exposure in workplace 03/25/2017  . Depressed bipolar I disorder in partial remission ([name]) 03/25/2017  . Routine general medical examination at a health care facility 06/04/2014  . Hyperlipidemia with target LDL less than 130 03/20/2014  . Essential hypertension, benign 03/20/2014  . Unspecified constipation 03/20/2014  . Light headedness 03/20/2014  . Acute on  chronic kidney disease, stage 3 03/20/2014  . Hypokalemia 03/20/2014  . Encounter for therapeutic drug monitoring 03/20/2014  . Other dysphagia 12/26/2013  . GERD (gastroesophageal reflux disease) 12/21/2013  . Constipation 12/21/2013  . Syncope 12/10/2013  . Leukocytosis 12/10/2013  . URI (upper respiratory infection) 12/10/2013  . Bipolar 1 disorder ([name])   . Obesity (BMI 30-39.9)   . Hearing loss in left ear   . Vertigo   . HYPERCHOLESTEROLEMIA 10/08/2010  . DEPRESSION 10/08/2010  . HYPERTENSION 10/08/2010  . ALLERGIC RHINITIS 10/08/2010    Conditions to be addressed/monitored:HTN, Mixed Hyperlipidemia, Hypokalemia, Tardive dyskinesia  Care Plan : Mixed Hyperlipidemia  Updates made by Lynne Logan, RN since 12/18/2020 12:00 AM    Problem: Mixed Hyperlipidemia   Priority: Medium    Long-Range Goal: Mixed Hyperlipidmia - disease comorbidity prevented or minimized   Start Date: 12/15/2020  Expected End Date: 06/17/2021  This Visit's Progress: On track  Priority: Medium  Note:   Current Barriers:  [name] Knowledge Deficits related to diagnosis and treatment of Hyperlipidemia . Chronic Disease Management support and education needs related to HTN, Mixed Hyperlipidemia, Hypokalemia, Tardive dyskinesia Nurse Case Manager Clinical Goal(s):  [name] Over the next 180 days, patient will verbalize improved adherence with dietary and exercise recommendations and taking prescribed medications as directed to help reduce the risk for comorbidity secondary to hyperlipidemia    CCM RN CM Interventions:  12/15/20 call completed with patient . Evaluation of current treatment plan related to Hyperlipidemia and patient's adherence to plan as established by provider . Reviewed and discussed patient's total Cholesterol obtained on 10/30/19 was 240, LDL 143; Educated on target ranges; Educated on dietary and exercise recommendations . Determined patient is adhering with her medication regimen for treatment of  Hyperlipidemia; Determined patient is taking Atorvastatin 10 mg qd without noted SE; Educated on importance of taking this medication exactly as prescribed w/o missed doses and to implement diet and exercise changes as discussed  . Discussed plans with patient for ongoing care management follow up and provided patient with direct contact information for care management team Patient Self Care Activities:  . Self administer medications as prescribed . Attend all scheduled provider appointments . Call pharmacy for medication refills . Call provider office for new concerns or questions Follow Up Date: 03/17/21    Plan:Telephone follow up appointment with care management team member scheduled for:  03/17/21  Barb Merino, RN, BSN, CCM Care Management Coordinator [facility]  Direct Phone: [phone]

## 2020-12-18 NOTE — Patient Instructions (Signed)
Goals Addressed    Other   .  Mixed Hyperlipidemia - comorbidity prevented or minimized   On track     Timeframe:  Long-Range Goal Priority:  Medium Start Date:  12/15/20                          Expected End Date:  06/17/21   Next Follow Up Date: 03/17/21  . Self administer medications as prescribed . Attend all scheduled provider appointments . Call pharmacy for medication refills . Call provider office for new concerns or questions

## 2021-01-29 ENCOUNTER — Telehealth: Payer: Self-pay

## 2021-01-29 NOTE — Telephone Encounter (Signed)
Ok

## 2021-01-29 NOTE — Telephone Encounter (Signed)
I was trying to be proactive on a prior authorization for INGREZZA 80 MG but the [employer] on file shows eligibility could not be verified. I will submit after her apt tomorrow with up to date insurance information.

## 2021-01-30 ENCOUNTER — Ambulatory Visit (INDEPENDENT_AMBULATORY_CARE_PROVIDER_SITE_OTHER): Payer: Medicare HMO | Admitting: Physician Assistant

## 2021-01-30 ENCOUNTER — Other Ambulatory Visit: Payer: Self-pay

## 2021-01-30 ENCOUNTER — Encounter: Payer: Self-pay | Admitting: Physician Assistant

## 2021-01-30 DIAGNOSIS — F3341 Major depressive disorder, recurrent, in partial remission: Secondary | ICD-10-CM

## 2021-01-30 DIAGNOSIS — R69 Illness, unspecified: Secondary | ICD-10-CM | POA: Diagnosis not present

## 2021-01-30 DIAGNOSIS — G2401 Drug induced subacute dyskinesia: Secondary | ICD-10-CM

## 2021-01-30 DIAGNOSIS — F411 Generalized anxiety disorder: Secondary | ICD-10-CM | POA: Diagnosis not present

## 2021-01-30 MED ORDER — GABAPENTIN 100 MG PO CAPS: 100.0000 mg | ORAL_CAPSULE | Freq: Three times a day (TID) | ORAL | 0 refills | Status: DC

## 2021-01-30 NOTE — Patient Instructions (Addendum)
On the gabapentin, take a 400 mg pill plus a 100 mg pill = 500 mg total, 3 times daily.

## 2021-01-30 NOTE — Progress Notes (Signed)
[facility]  Patient ID: Judy [name],  MRN: [id number]  PCP: [name], FNP  Date of Evaluation: 01/30/2021 Time spent:30 minutes  Chief Complaint:  Chief Complaint    Anxiety; Depression; Insomnia; Follow-up      HISTORY/CURRENT STATUS: HPI For routine med check.  At the last visit we increased Ingrezza.  She is no longer having the abnormal mouth movements as bad as she did.  They are still present a little bit but not anything like they were.  Her balance is also better.  She uses her Rollator walker or her walking stick still to make sure she is not as careful as possible but she is doing a lot better.  Able to enjoy things.  Energy and motivation are still kind of low but feels like part of that is due to her age.  Does not cry easily.  She does isolate some but in part due to COVID restrictions and concern that she could get it.  No suicidal or homicidal thoughts.  Patient denies increased energy with decreased need for sleep, no increased talkativeness, no racing thoughts, no impulsivity or risky behaviors, no increased spending, no increased libido, no grandiosity, no increased irritability or anger, and no hallucinations.  Not as anxious now because she is not as afraid of falling.  The gabapentin increased at the last visit has helped with her balance and anxiety to.  Individual Medical History/ Review of Systems: Changes? :No    Past medications for mental health diagnoses include: Latuda, Seroquel, Prozac, Fetzima, Risperdal, gabapentin, Wellbutrin  Allergies: Patient has no known allergies.  Current Medications:  Current Outpatient Medications:  .  acetaminophen (TYLENOL) 325 MG tablet, Take 2 tablets (650 mg total) by mouth every 6 (six) hours as needed for mild pain (or Fever >/= 101)., Disp:  , Rfl:  .  buPROPion (WELLBUTRIN XL) 150 MG 24 hr tablet, Take 1 tablet (150 mg total) by mouth daily., Disp: 90 tablet, Rfl: 0 .  gabapentin (NEURONTIN)  100 MG capsule, Take 1 capsule (100 mg total) by mouth 3 (three) times daily. Take with 400 mg pills= 500 mg tid., Disp: 270 capsule, Rfl: 0 .  gabapentin (NEURONTIN) 400 MG capsule, Take 1 capsule (400 mg total) by mouth 3 (three) times daily., Disp: 90 capsule, Rfl: 1 .  Multiple Vitamin (DAILY VITE PO), Take 1 tablet by mouth daily., Disp: , Rfl:  .  Valbenazine Tosylate (INGREZZA) 80 MG CAPS, Take 80 mg by mouth daily., Disp: 30 capsule, Rfl: 11 .  amLODipine (NORVASC) 5 MG tablet, TAKE 1 TABLET (5 MG TOTAL) BY MOUTH DAILY. PATIENT NEEDS AN APPOINTMENT (Patient not taking: Reported on 11/20/2020), Disp: 30 tablet, Rfl: 0 .  apixaban (ELIQUIS) 5 MG TABS tablet, Take 1-2 tablets (5-10 mg total) by mouth 2 (two) times daily. Take 2 tablets (10mg ) daily 2 times daily through 11/24/2019, then starting 11/25/2019 start taking 1 tablet (5mg ) 2 times daily. (Patient not taking: Reported on 11/20/2020), Disp: 60 tablet, Rfl: 2 .  atorvastatin (LIPITOR) 10 MG tablet, Take 1 tablet (10 mg total) by mouth daily., Disp: 30 tablet, Rfl: 3 .  docusate sodium (COLACE) 100 MG capsule, Take 100 mg by mouth 2 (two) times daily. (Patient not taking: Reported on 12/02/2020), Disp: , Rfl:  .  meclizine (ANTIVERT) 25 MG tablet, Take 1 tablet (25 mg total) by mouth 3 (three) times daily as needed for dizziness. (Patient not taking: No sig reported), Disp: 15 tablet, Rfl: 0 .  traMADol (ULTRAM) 50 MG tablet, Take 1 tablet (50 mg total) by mouth every 6 (six) hours as needed for moderate pain. (Patient not taking: No sig reported), Disp: 6 tablet, Rfl: 0 .  Vitamin D, Ergocalciferol, (DRISDOL) 1.25 MG (50000 UNIT) CAPS capsule, Take 1 capsule (50,000 Units total) by mouth every 7 (seven) days., Disp: 12 capsule, Rfl: 0 Medication Side Effects: none  Family Medical/ Social History: Changes? no  MENTAL HEALTH EXAM:  There were no vitals taken for this visit.There is no height or weight on file to calculate BMI.  General  Appearance: Casual, Neat and Well Groomed  Eye Contact:  Good  Speech:  Clear and Coherent and Normal Rate  Volume:  Normal  Mood:  Euthymic  Affect:  Appropriate  Thought Process:  Goal Directed and Descriptions of Associations: Circumstantial  Orientation:  Full (Time, Place, and Person)  Thought Content: Logical   Suicidal Thoughts:  No  Homicidal Thoughts:  No  Memory:  WNL  Judgement:  Good  Insight:  Good  Psychomotor Activity:  Decreased, TD and Pursing of her lips as not as bad.  Concentration:  Concentration: Good  Recall:  Good  Fund of Knowledge: Good  Language: Good  Assets:  Desire for Improvement  ADL's:  Intact  Cognition: WNL  Prognosis:  Fair    DIAGNOSES:    ICD-10-CM   1. Generalized anxiety disorder  F41.1   2. Tardive dyskinesia  G24.01   3. Recurrent major depressive disorder, in partial remission ([place])  F33.41     Receiving Psychotherapy: No    RECOMMENDATIONS:  PDMP was reviewed. I provided 30 minutes of face-to-face time during this encounter, including time spent before and after the visit and records review and charting. Recommend increasing gabapentin to help with anxiety.  She takes 400 mg 3 times daily but I would like to increase.  She already has a 90-day supply coming from her mail order pharmacy.  Will add 100 mg 3 times daily to equal 500 mg 3 times daily.  Patient verbalizes understanding.  She is concerned that the mail order supply may not get to her.  I have assured her we will send in a prescription for what ever she needs. Mood is stable off of an antipsychotic or mood stabilizer.  It is not necessary to restart 1 because she is having no severe depression and no manic symptoms in a very long time.  She understands and agrees and would not take a mood stabilizer or antipsychotic even if prescribed. I am glad the tardive dyskinesia is some better. Continue Wellbutrin XL 150 mg, 1 p.o. every morning. Continue gabapentin 400 mg, 1 p.o. 3  times daily+100 mg tid=500 mg tid.  Start gabapentin 100 mg 1 p.o. 3 times daily. Continue Ingrezza 80 mg, 1 p.o. daily. Return in 4 months.  Donnal Moat, PA-C

## 2021-02-02 ENCOUNTER — Ambulatory Visit: Payer: Medicare HMO

## 2021-02-02 DIAGNOSIS — E782 Mixed hyperlipidemia: Secondary | ICD-10-CM

## 2021-02-02 DIAGNOSIS — I10 Essential (primary) hypertension: Secondary | ICD-10-CM

## 2021-02-02 NOTE — Patient Instructions (Signed)
Social Worker Visit Information  Goals we discussed today:  Goals Addressed            This Visit's Progress   . COMPLETED: Work with SW to manage care coordination needs       Timeframe:  Long-Range Goal Priority:  Low Start Date:    12.29.21                         Expected End Date:   4.28.22                    Goal Met 4.25.22  Patient Goals/Self-Care Activities Over the next 90 days, patient will:   - Patient will self administer medications as prescribed Patient will attend all scheduled provider appointments Patient will call provider office for new concerns or questions Contact SW as needed prior to next scheduled call Access health plan transportation benefit as needed for upcomming appointments        Materials Provided: Verbal education about health plan benefit provided by phone  Follow Up Plan: No SW follow up planned at this time. Please contact me with future resource needs.   Daneen Schick, BSW, CDP Social Worker, Certified Dementia Practitioner [place] [facility] [phone]

## 2021-02-02 NOTE — Chronic Care Management (AMB) (Signed)
Chronic Care Management    Social Work Note  02/02/2021 Name: Judy [name] MRN: [id number] DOB: 1945/12/10  Judy [name] is a 75 y.o. year old female who is a primary care patient of [name], [place]. The CCM team was consulted to assist the patient with chronic disease management and/or care coordination needs related to: Transportation Needs .   Engaged with patient by telephone for follow up visit in response to provider referral for social work chronic care management and care coordination services.   Consent to Services:  The patient was given information about Chronic Care Management services, agreed to services, and gave verbal consent prior to initiation of services.  Please see initial visit note for detailed documentation.   Patient agreed to services and consent obtained.   Assessment: Review of patient past medical history, allergies, medications, and health status, including review of relevant consultants reports was performed today as part of a comprehensive evaluation and provision of chronic care management and care coordination services.     SDOH (Social Determinants of Health) assessments and interventions performed:    Advanced Directives Status: Not addressed in this encounter.  CCM Care Plan  No Known Allergies  Outpatient Encounter Medications as of 02/02/2021  Medication Sig  . acetaminophen (TYLENOL) 325 MG tablet Take 2 tablets (650 mg total) by mouth every 6 (six) hours as needed for mild pain (or Fever >/= 101).  [name] amLODipine (NORVASC) 5 MG tablet TAKE 1 TABLET (5 MG TOTAL) BY MOUTH DAILY. PATIENT NEEDS AN APPOINTMENT (Patient not taking: Reported on 11/20/2020)  . apixaban (ELIQUIS) 5 MG TABS tablet Take 1-2 tablets (5-10 mg total) by mouth 2 (two) times daily. Take 2 tablets (10mg ) daily 2 times daily through 11/24/2019, then starting 11/25/2019 start taking 1 tablet (5mg ) 2 times daily. (Patient not taking: Reported on 11/20/2020)  . atorvastatin  (LIPITOR) 10 MG tablet Take 1 tablet (10 mg total) by mouth daily.  [name] buPROPion (WELLBUTRIN XL) 150 MG 24 hr tablet Take 1 tablet (150 mg total) by mouth daily.  [name] docusate sodium (COLACE) 100 MG capsule Take 100 mg by mouth 2 (two) times daily. (Patient not taking: Reported on 12/02/2020)  . gabapentin (NEURONTIN) 100 MG capsule Take 1 capsule (100 mg total) by mouth 3 (three) times daily. Take with 400 mg pills= 500 mg tid.  . gabapentin (NEURONTIN) 400 MG capsule Take 1 capsule (400 mg total) by mouth 3 (three) times daily.  . meclizine (ANTIVERT) 25 MG tablet Take 1 tablet (25 mg total) by mouth 3 (three) times daily as needed for dizziness. (Patient not taking: No sig reported)  . Multiple Vitamin (DAILY VITE PO) Take 1 tablet by mouth daily.  . traMADol (ULTRAM) 50 MG tablet Take 1 tablet (50 mg total) by mouth every 6 (six) hours as needed for moderate pain. (Patient not taking: No sig reported)  . Valbenazine Tosylate (INGREZZA) 80 MG CAPS Take 80 mg by mouth daily.  . Vitamin D, Ergocalciferol, (DRISDOL) 1.25 MG (50000 UNIT) CAPS capsule Take 1 capsule (50,000 Units total) by mouth every 7 (seven) days.   No facility-administered encounter medications on file as of 02/02/2021.    Patient Active Problem List   Diagnosis Date Noted  . Pulmonary embolism ([place]) 11/15/2019  . Spinal stenosis 11/07/2019  . Weakness of both lower extremities 11/06/2019  . Generalized anxiety disorder 07/24/2018  . Solitary pulmonary nodule on lung CT 06/13/2017  . Spinal stenosis at L4-L5 level   . Dysphagia 05/25/2017  .  Tardive dyskinesia 05/25/2017  . Dyspnea on exertion 05/25/2017  . Fall 05/25/2017  . Bilateral leg pain   . Chronic pain of right knee 03/25/2017  . High risk medication use 03/25/2017  . Family hx of colon cancer 03/25/2017  . Occupational exposure in workplace 03/25/2017  . Depressed bipolar I disorder in partial remission ([place]) 03/25/2017  . Routine general medical examination at  a health care facility 06/04/2014  . Hyperlipidemia with target LDL less than 130 03/20/2014  . Essential hypertension, benign 03/20/2014  . Unspecified constipation 03/20/2014  . Light headedness 03/20/2014  . Acute on chronic kidney disease, stage 3 03/20/2014  . Hypokalemia 03/20/2014  . Encounter for therapeutic drug monitoring 03/20/2014  . Other dysphagia 12/26/2013  . GERD (gastroesophageal reflux disease) 12/21/2013  . Constipation 12/21/2013  . Syncope 12/10/2013  . Leukocytosis 12/10/2013  . URI (upper respiratory infection) 12/10/2013  . Bipolar 1 disorder ([place])   . Obesity (BMI 30-39.9)   . Hearing loss in left ear   . Vertigo   . HYPERCHOLESTEROLEMIA 10/08/2010  . DEPRESSION 10/08/2010  . HYPERTENSION 10/08/2010  . ALLERGIC RHINITIS 10/08/2010    Conditions to be addressed/monitored: HTN and Mixed Hyperlipidemia; Transportation  Care Plan : Social Work Care Plan  Updates made by Daneen Schick since 02/02/2021 12:00 AM  Completed 02/02/2021  Problem: Care Coordination Resolved 02/02/2021  Priority: Low    Long-Range Goal: Collaborate with RN Care Manager to perform appropriate assessments to assist with care coordination needs Completed 02/02/2021  Start Date: 10/08/2020  Expected End Date: 02/05/2021  Recent Progress: On track  Priority: Low  Note:   Current Barriers:  . Limited social support . Transportation . Chronic conditions including HTN, Mixed Hyperlipidemia, Hypokalemia, and Tardive Dyskinesia which put patient at increased risk of hospitalization  Social Work Clinical Goal(s):  [name] Over the next 120 days, patient will work with SW to address concerns related to care coordination  Interventions: . 1:1 collaboration with [name], [place] regarding development and update of comprehensive plan of care as evidenced by provider attestation and co-signature . Inter-disciplinary care team collaboration (see longitudinal plan of care) . Successful outbound  call placed to the patient to assess for care coordination needs - no acute challenges noted at this time . Reviewed transportation resource offered under patients health plan - patient acknowledges this benefit and does not have questions at this time . No SW follow up planned at this time - the patient will remain active with RN Care Manager and is encouraged to contact SW as needed with future resource needs  Patient Goals/Self-Care Activities Over the next 90 days, patient will:   - Patient will self administer medications as prescribed Patient will attend all scheduled provider appointments Patient will call provider office for new concerns or questions Contact SW as needed prior to next scheduled call Access health plan transportation benefit as needed for upcomming appointments        Follow Up Plan: No SW follow up planned at this time. The patient will remain active with RN Care Manager.      Daneen Schick, BSW, CDP Social Worker, Certified Dementia Practitioner [place] [facility] [phone]  Total time spent performing care coordination and/or care management activities with the patient by phone or face to face = 15 minutes.

## 2021-02-03 ENCOUNTER — Telehealth: Payer: Medicare HMO

## 2021-02-04 ENCOUNTER — Telehealth: Payer: Self-pay | Admitting: Physician Assistant

## 2021-02-04 ENCOUNTER — Other Ambulatory Visit: Payer: Self-pay | Admitting: Physician Assistant

## 2021-02-04 NOTE — Telephone Encounter (Signed)
error 

## 2021-02-10 ENCOUNTER — Telehealth: Payer: Self-pay

## 2021-02-10 NOTE — Telephone Encounter (Signed)
Prior Approval received for INGREZZA 80 MG effective 12/09/2020-10/10/2021. With [employer] ID# [id number]

## 2021-03-03 ENCOUNTER — Telehealth: Payer: Self-pay | Admitting: Physician Assistant

## 2021-03-03 ENCOUNTER — Other Ambulatory Visit: Payer: Self-pay

## 2021-03-03 DIAGNOSIS — Z809 Family history of malignant neoplasm, unspecified: Secondary | ICD-10-CM | POA: Diagnosis not present

## 2021-03-03 DIAGNOSIS — R030 Elevated blood-pressure reading, without diagnosis of hypertension: Secondary | ICD-10-CM | POA: Diagnosis not present

## 2021-03-03 DIAGNOSIS — K5900 Constipation, unspecified: Secondary | ICD-10-CM | POA: Diagnosis not present

## 2021-03-03 DIAGNOSIS — F411 Generalized anxiety disorder: Secondary | ICD-10-CM | POA: Diagnosis not present

## 2021-03-03 DIAGNOSIS — R69 Illness, unspecified: Secondary | ICD-10-CM | POA: Diagnosis not present

## 2021-03-03 DIAGNOSIS — Z8249 Family history of ischemic heart disease and other diseases of the circulatory system: Secondary | ICD-10-CM | POA: Diagnosis not present

## 2021-03-03 DIAGNOSIS — E785 Hyperlipidemia, unspecified: Secondary | ICD-10-CM | POA: Diagnosis not present

## 2021-03-03 DIAGNOSIS — R32 Unspecified urinary incontinence: Secondary | ICD-10-CM | POA: Diagnosis not present

## 2021-03-03 MED ORDER — GABAPENTIN 400 MG PO CAPS: 400.0000 mg | ORAL_CAPSULE | Freq: Three times a day (TID) | ORAL | 1 refills | Status: DC

## 2021-03-03 MED ORDER — GABAPENTIN 100 MG PO CAPS: 100.0000 mg | ORAL_CAPSULE | Freq: Three times a day (TID) | ORAL | 0 refills | Status: DC

## 2021-03-03 NOTE — Telephone Encounter (Signed)
Prescriptions were sent

## 2021-03-03 NOTE — Telephone Encounter (Signed)
pended

## 2021-03-03 NOTE — Telephone Encounter (Signed)
Pt called to request a RF on Neurontin 400mg . [facility] does not have a RF on file. Can we send in new RX to [facility] for her?

## 2021-03-17 ENCOUNTER — Telehealth: Payer: Medicare HMO

## 2021-03-17 ENCOUNTER — Ambulatory Visit (INDEPENDENT_AMBULATORY_CARE_PROVIDER_SITE_OTHER): Payer: Medicare HMO

## 2021-03-17 DIAGNOSIS — E782 Mixed hyperlipidemia: Secondary | ICD-10-CM

## 2021-03-17 DIAGNOSIS — I10 Essential (primary) hypertension: Secondary | ICD-10-CM

## 2021-03-17 DIAGNOSIS — G2401 Drug induced subacute dyskinesia: Secondary | ICD-10-CM

## 2021-03-17 DIAGNOSIS — E876 Hypokalemia: Secondary | ICD-10-CM

## 2021-03-17 NOTE — Chronic Care Management (AMB) (Signed)
Chronic Care Management   CCM RN Visit Note  03/17/2021 Name: Judy [name] MRN: [id number] DOB: May 29, 1946  Subjective: Judy [name] is a 75 y.o. year old female who is a primary care patient of [name], [place]. The care management team was consulted for assistance with disease management and care coordination needs.    Engaged with patient by telephone for follow up visit in response to provider referral for case management and/or care coordination services.   Consent to Services:  The patient was given information about Chronic Care Management services, agreed to services, and gave verbal consent prior to initiation of services.  Please see initial visit note for detailed documentation.   Patient agreed to services and verbal consent obtained.   Assessment: Review of patient past medical history, allergies, medications, health status, including review of consultants reports, laboratory and other test data, was performed as part of comprehensive evaluation and provision of chronic care management services.   SDOH (Social Determinants of Health) assessments and interventions performed:  yes, no acute needs   CCM Care Plan  No Known Allergies  Outpatient Encounter Medications as of 03/17/2021  Medication Sig  . acetaminophen (TYLENOL) 325 MG tablet Take 2 tablets (650 mg total) by mouth every 6 (six) hours as needed for mild pain (or Fever >/= 101).  [name] amLODipine (NORVASC) 5 MG tablet TAKE 1 TABLET (5 MG TOTAL) BY MOUTH DAILY. PATIENT NEEDS AN APPOINTMENT (Patient not taking: Reported on 11/20/2020)  . apixaban (ELIQUIS) 5 MG TABS tablet Take 1-2 tablets (5-10 mg total) by mouth 2 (two) times daily. Take 2 tablets (10mg ) daily 2 times daily through 11/24/2019, then starting 11/25/2019 start taking 1 tablet (5mg ) 2 times daily. (Patient not taking: Reported on 11/20/2020)  . atorvastatin (LIPITOR) 10 MG tablet Take 1 tablet (10 mg total) by mouth daily.  [name] buPROPion (WELLBUTRIN XL) 150  MG 24 hr tablet Take 1 tablet (150 mg total) by mouth daily.  [name] docusate sodium (COLACE) 100 MG capsule Take 100 mg by mouth 2 (two) times daily. (Patient not taking: Reported on 12/02/2020)  . gabapentin (NEURONTIN) 100 MG capsule Take 1 capsule (100 mg total) by mouth 3 (three) times daily. Take with 400 mg pills= 500 mg tid.  . gabapentin (NEURONTIN) 400 MG capsule Take 1 capsule (400 mg total) by mouth 3 (three) times daily.  . meclizine (ANTIVERT) 25 MG tablet Take 1 tablet (25 mg total) by mouth 3 (three) times daily as needed for dizziness. (Patient not taking: No sig reported)  . Multiple Vitamin (DAILY VITE PO) Take 1 tablet by mouth daily.  . traMADol (ULTRAM) 50 MG tablet Take 1 tablet (50 mg total) by mouth every 6 (six) hours as needed for moderate pain. (Patient not taking: No sig reported)  . Valbenazine Tosylate (INGREZZA) 80 MG CAPS Take 80 mg by mouth daily.  . Vitamin D, Ergocalciferol, (DRISDOL) 1.25 MG (50000 UNIT) CAPS capsule Take 1 capsule (50,000 Units total) by mouth every 7 (seven) days.   No facility-administered encounter medications on file as of 03/17/2021.    Patient Active Problem List   Diagnosis Date Noted  . Pulmonary embolism ([place]) 11/15/2019  . Spinal stenosis 11/07/2019  . Weakness of both lower extremities 11/06/2019  . Generalized anxiety disorder 07/24/2018  . Solitary pulmonary nodule on lung CT 06/13/2017  . Spinal stenosis at L4-L5 level   . Dysphagia 05/25/2017  . Tardive dyskinesia 05/25/2017  . Dyspnea on exertion 05/25/2017  . Fall 05/25/2017  .  Bilateral leg pain   . Chronic pain of right knee 03/25/2017  . High risk medication use 03/25/2017  . Family hx of colon cancer 03/25/2017  . Occupational exposure in workplace 03/25/2017  . Depressed bipolar I disorder in partial remission ([place]) 03/25/2017  . Routine general medical examination at a health care facility 06/04/2014  . Hyperlipidemia with target LDL less than 130 03/20/2014  .  Essential hypertension, benign 03/20/2014  . Unspecified constipation 03/20/2014  . Light headedness 03/20/2014  . Acute on chronic kidney disease, stage 3 03/20/2014  . Hypokalemia 03/20/2014  . Encounter for therapeutic drug monitoring 03/20/2014  . Other dysphagia 12/26/2013  . GERD (gastroesophageal reflux disease) 12/21/2013  . Constipation 12/21/2013  . Syncope 12/10/2013  . Leukocytosis 12/10/2013  . URI (upper respiratory infection) 12/10/2013  . Bipolar 1 disorder ([place])   . Obesity (BMI 30-39.9)   . Hearing loss in left ear   . Vertigo   . HYPERCHOLESTEROLEMIA 10/08/2010  . DEPRESSION 10/08/2010  . HYPERTENSION 10/08/2010  . ALLERGIC RHINITIS 10/08/2010    Conditions to be addressed/monitored:HTN, Mixed Hyperlipidemia, Hypokalemia, Tardive dyskinesia  Care Plan : Mixed Hyperlipidemia  Updates made by Lynne Logan, RN since 03/17/2021 12:00 AM    Problem: Mixed Hyperlipidemia   Priority: Medium    Long-Range Goal: Mixed Hyperlipidmia - disease comorbidity prevented or minimized   Start Date: 12/15/2020  Expected End Date: 12/15/2021  Recent Progress: On track  Priority: Medium  Note:   Current Barriers:  [name] Knowledge Deficits related to diagnosis and treatment of Hyperlipidemia . Chronic Disease Management support and education needs related to HTN, Mixed Hyperlipidemia, Hypokalemia, Tardive dyskinesia Nurse Case Manager Clinical Goal(s):  [name] Patient will verbalize improved adherence with dietary and exercise recommendations and taking prescribed medications as directed to help reduce the risk for comorbidity secondary to hyperlipidemia    CCM RN CM Interventions:  03/17/21 completed successful outbound call with patient  . Evaluation of current treatment plan related to Hyperlipidemia and patient's adherence to plan as established by provider . Collaboration with [name] FNP regarding development and update of comprehensive plan of care as evidenced by provider  attestation and co-signature . Inter-disciplinary care team collaboration (see longitudinal plan of care) . Provided education to patient about basic disease process related to Hyperlipidemia  . Review of patient status, including review of consultant's reports, relevant laboratory and other test results, and medications completed. . Reviewed medications with patient and discussed importance of medication adherence . Determined patient is exercising at least 3 days per week, she is using her rollator walker and walking short distances . Determined patient is now receiving home delivered meals and feels she is adhering to dietary recommendations  . Mailed printed educational materials related to Elderly Nutrition 101 . Discussed plans with patient for ongoing care management follow up and provided patient with direct contact information for care management team Patient Self-Care Activities:  . Self administer medications as prescribed . Attend all scheduled provider appointments . Call pharmacy for medication refills . Call provider office for new concerns or questions . Adhere to dietary and exercise recommendations Patient Goals:  - to improve Cholesterol levels to target range  Next Scheduled Follow up date: 06/17/21    Plan:Telephone follow up appointment with care management team member scheduled for:  06/17/21  Barb Merino, RN, BSN, CCM Care Management Coordinator [facility]  Direct Phone: [phone]

## 2021-03-17 NOTE — Patient Instructions (Signed)
Goals Addressed    . Mixed Hyperlipidemia - comorbidity prevented or minimized   On track    Timeframe:  Long-Range Goal Priority:  Medium Start Date:  12/15/20                          Expected End Date:  12/15/21   Next Follow Up Date: 06/17/21  . Self administer medications as prescribed . Attend all scheduled provider appointments . Call pharmacy for medication refills . Call provider office for new concerns or questions  . Adhere to dietary and exercise recommendations

## 2021-04-03 ENCOUNTER — Telehealth: Payer: Self-pay

## 2021-04-03 NOTE — Telephone Encounter (Signed)
I called pt to schedule her an appt and she stated that she would call me back to schedule because she couldn't really hear me [facility]

## 2021-04-09 ENCOUNTER — Ambulatory Visit (INDEPENDENT_AMBULATORY_CARE_PROVIDER_SITE_OTHER): Payer: Medicare HMO | Admitting: Nurse Practitioner

## 2021-04-09 ENCOUNTER — Encounter: Payer: Self-pay | Admitting: Nurse Practitioner

## 2021-04-09 ENCOUNTER — Other Ambulatory Visit: Payer: Self-pay

## 2021-04-09 VITALS — BP 142/90 | HR 82 | Temp 98.5°F | Ht 69.0 in | Wt 170.6 lb

## 2021-04-09 DIAGNOSIS — Z23 Encounter for immunization: Secondary | ICD-10-CM

## 2021-04-09 DIAGNOSIS — F319 Bipolar disorder, unspecified: Secondary | ICD-10-CM

## 2021-04-09 DIAGNOSIS — F322 Major depressive disorder, single episode, severe without psychotic features: Secondary | ICD-10-CM | POA: Diagnosis not present

## 2021-04-09 DIAGNOSIS — Z86711 Personal history of pulmonary embolism: Secondary | ICD-10-CM | POA: Diagnosis not present

## 2021-04-09 DIAGNOSIS — E782 Mixed hyperlipidemia: Secondary | ICD-10-CM | POA: Diagnosis not present

## 2021-04-09 DIAGNOSIS — Z1231 Encounter for screening mammogram for malignant neoplasm of breast: Secondary | ICD-10-CM

## 2021-04-09 DIAGNOSIS — I10 Essential (primary) hypertension: Secondary | ICD-10-CM | POA: Diagnosis not present

## 2021-04-09 DIAGNOSIS — R69 Illness, unspecified: Secondary | ICD-10-CM | POA: Diagnosis not present

## 2021-04-09 MED ORDER — TETANUS-DIPHTH-ACELL PERTUSSIS 5-2.5-18.5 LF-MCG/0.5 IM SUSP: 0.5000 mL | Freq: Once | INTRAMUSCULAR | 0 refills | Status: AC

## 2021-04-09 MED ORDER — PREVNAR 20 0.5 ML IM SUSY: 0.5000 mL | PREFILLED_SYRINGE | INTRAMUSCULAR | 0 refills | Status: AC

## 2021-04-09 MED ORDER — ATORVASTATIN CALCIUM 10 MG PO TABS: 10.0000 mg | ORAL_TABLET | Freq: Every day | ORAL | 1 refills | Status: DC

## 2021-04-09 MED ORDER — SHINGRIX 50 MCG/0.5ML IM SUSR: 0.5000 mL | Freq: Once | INTRAMUSCULAR | 0 refills | Status: AC

## 2021-04-09 NOTE — Progress Notes (Signed)
I,Yamilka Roman [employer] as a Education administrator for [employer], FNP.,have documented all relevant documentation on the behalf of [name], FNP,as directed by  [name], FNP while in the presence of [name], [place].  This visit occurred during the SARS-CoV-2 public health emergency.  Safety protocols were in place, including screening questions prior to the visit, additional usage of staff PPE, and extensive cleaning of exam room while observing appropriate contact time as indicated for disinfecting solutions.  Subjective:     Patient ID: Judy [name] , female    DOB: Aug 08, 1946 , 75 y.o.   MRN: [id number]   Chief Complaint  Patient presents with   Hypertension    HPI  The patient is here for a blood pressure f/u.  Hypertension This is a chronic problem. The current episode started more than 1 year ago. The problem is unchanged. The problem is controlled. Pertinent negatives include no anxiety, chest pain, headaches or palpitations. Risk factors for coronary artery disease include sedentary lifestyle. Past treatments include calcium channel blockers. There are no compliance problems.   URI  This is a new problem. The current episode started 1 to 4 weeks ago (2 weeks ago). There has been no fever. Associated symptoms include rhinorrhea. Pertinent negatives include no abdominal pain, chest pain, congestion, coughing, dysuria, headaches, sneezing or wheezing. She has tried nothing for the symptoms. The treatment provided no relief.    Past Medical History:  Diagnosis Date   Allergic rhinitis    Anemia    Anxiety    Arthritis    Bipolar 1 disorder (HCC)    Depression    Diverticulosis of colon (without mention of hemorrhage) 08/25/2011   [name]   Hearing loss in left ear    since birth   Hyperlipidemia    Hypertension    Obesity    Renal disorder    Vertigo      Family History  Problem Relation Age of Onset   Prostate cancer Brother    Hypertension Brother     Kidney disease Brother    Colon cancer Brother    Hypertension Mother    Kidney disease Mother    Stomach cancer Father    Hyperlipidemia Sister    Hypothyroidism Sister    Stroke Brother    Prostate cancer Brother      Current Outpatient Medications:    buPROPion (WELLBUTRIN XL) 150 MG 24 hr tablet, Take 1 tablet (150 mg total) by mouth daily., Disp: 90 tablet, Rfl: 0   gabapentin (NEURONTIN) 400 MG capsule, Take 1 capsule (400 mg total) by mouth 3 (three) times daily., Disp: 270 capsule, Rfl: 1   pneumococcal 20-Val Conj Vacc (PREVNAR 20) 0.5 ML SUSY, Inject 0.5 mLs into the muscle tomorrow at 10 am for 1 dose., Disp: 0.5 mL, Rfl: 0   Tdap (BOOSTRIX) 5-2.5-18.5 LF-MCG/0.5 injection, Inject 0.5 mLs into the muscle once for 1 dose., Disp: 0.5 mL, Rfl: 0   Zoster Vaccine Adjuvanted (SHINGRIX) injection, Inject 0.5 mLs into the muscle once for 1 dose., Disp: 0.5 mL, Rfl: 0   atorvastatin (LIPITOR) 10 MG tablet, Take 1 tablet (10 mg total) by mouth daily., Disp: 90 tablet, Rfl: 1   No Known Allergies   Review of Systems  HENT:  Positive for rhinorrhea. Negative for congestion and sneezing.   Respiratory:  Negative for cough and wheezing.   Cardiovascular:  Negative for chest pain and palpitations.  Gastrointestinal:  Negative for abdominal pain.  Genitourinary:  Negative for  dysuria.  Neurological:  Negative for headaches.    Today's Vitals   04/09/21 1710  BP: (!) 142/90  Pulse: 82  Temp: 98.5 F (36.9 C)  TempSrc: Oral  Weight: 170 lb 9.6 oz (77.4 kg)  Height: $Remove'5\' 9"'UdsHflh$  (1.753 m)   Body mass index is 25.19 kg/m.   Objective:  Physical Exam Vitals reviewed.  Constitutional:      General: She is not in acute distress.    Appearance: Normal appearance. She is well-developed.  HENT:     Head: Normocephalic and atraumatic.  Cardiovascular:     Rate and Rhythm: Normal rate and regular rhythm.     Pulses: Normal pulses.     Heart sounds: Normal heart sounds. No murmur  heard. Pulmonary:     Effort: Pulmonary effort is normal. No respiratory distress.     Breath sounds: Normal breath sounds. No wheezing.  Skin:    General: Skin is warm and dry.     Capillary Refill: Capillary refill takes less than 2 seconds.  Neurological:     General: No focal deficit present.     Mental Status: She is alert and oriented to person, place, and time.     Cranial Nerves: No cranial nerve deficit.  Psychiatric:        Mood and Affect: Mood normal.        Behavior: Behavior normal.        Thought Content: Thought content normal.        Judgment: Judgment normal.        Assessment And Plan:     1. Essential hypertension She is not currently on any medications and has not been in the office for over one year. She will come back on Tuesday for a nurse visit blood pressure check. If elevated will start her on low dose blood pressure medication (amlodipine 2.5 mg daily) - CBC; Future  2. Mixed hyperlipidemia Chronic, controlled She has not been on any medications for at least one year.  - Lipid panel; Future - CMP14+EGFR; Future - atorvastatin (LIPITOR) 10 MG tablet; Take 1 tablet (10 mg total) by mouth daily.  Dispense: 90 tablet; Refill: 1 - CBC; Future  3. Severe major depression without psychotic features ([place]) Continue follow up with [facility]; Future - CBC; Future  4. Bipolar 1 disorder ([place]) Continue follow up with Donnal Moat  5. History of pulmonary embolism She had a PE in February 2021 was followed by [facility], no longer taking any anticoagulant. No shortness of breath.   6. Encounter for immunization Will give tetanus vaccine today while in office. Refer to order management. TDAP will be administered to adults 31-82 years old every 10 years. - Zoster Vaccine Adjuvanted [facility]) injection; Inject 0.5 mLs into the muscle once for 1 dose.  Dispense: 0.5 mL; Refill: 0 - Tdap (BOOSTRIX) 5-2.5-18.5 LF-MCG/0.5 injection; Inject 0.5  mLs into the muscle once for 1 dose.  Dispense: 0.5 mL; Refill: 0 - pneumococcal 20-Val Conj Vacc (PREVNAR 20) 0.5 ML SUSY; Inject 0.5 mLs into the muscle tomorrow at 10 am for 1 dose.  Dispense: 0.5 mL; Refill: 0  7. Screening mammogram, encounter for Pt instructed on Self Breast Exam.According to ACOG guidelines Women aged 66 and older are recommended to get an annual mammogram. Form completed and given to patient contact the [facility] for appointment scheduing.  Pt encouraged to get annual mammogram - MM DIGITAL SCREENING BILATERAL; Future   She takes a cab  to the office, I have sent a message to Tillie Rung SW to see if she can help with her transportation as the patient arrived 15 minutes late.    Patient was given opportunity to ask questions. Patient verbalized understanding of the plan and was able to repeat key elements of the plan. All questions were answered to their satisfaction.  [name], FNP   I, [name], FNP, have reviewed all documentation for this visit. The documentation on 04/09/21 for the exam, diagnosis, procedures, and orders are all accurate and complete.   IF YOU HAVE BEEN REFERRED TO A SPECIALIST, IT MAY TAKE 1-2 WEEKS TO SCHEDULE/PROCESS THE REFERRAL. IF YOU HAVE NOT HEARD FROM US/SPECIALIST IN TWO WEEKS, PLEASE GIVE [place] A CALL AT [phone].   THE PATIENT IS ENCOURAGED TO PRACTICE SOCIAL DISTANCING DUE TO THE COVID-19 PANDEMIC.

## 2021-04-09 NOTE — Patient Instructions (Signed)

## 2021-04-14 ENCOUNTER — Ambulatory Visit: Payer: Medicare HMO

## 2021-04-14 ENCOUNTER — Other Ambulatory Visit: Payer: Self-pay

## 2021-04-14 ENCOUNTER — Other Ambulatory Visit: Payer: Medicare HMO

## 2021-04-14 DIAGNOSIS — I10 Essential (primary) hypertension: Secondary | ICD-10-CM | POA: Diagnosis not present

## 2021-04-14 DIAGNOSIS — R69 Illness, unspecified: Secondary | ICD-10-CM | POA: Diagnosis not present

## 2021-04-14 DIAGNOSIS — E782 Mixed hyperlipidemia: Secondary | ICD-10-CM

## 2021-04-14 DIAGNOSIS — F322 Major depressive disorder, single episode, severe without psychotic features: Secondary | ICD-10-CM

## 2021-04-15 LAB — CMP14+EGFR
ALT: 36 IU/L — ABNORMAL HIGH (ref 0–32)
AST: 39 IU/L (ref 0–40)
Albumin/Globulin Ratio: 2 (ref 1.2–2.2)
Albumin: 4.7 g/dL (ref 3.7–4.7)
Alkaline Phosphatase: 79 IU/L (ref 44–121)
BUN/Creatinine Ratio: 13 (ref 12–28)
BUN: 13 mg/dL (ref 8–27)
Bilirubin Total: <0.2 mg/dL (ref 0.0–1.2)
CO2: 25 mmol/L (ref 20–29)
Calcium: 10.1 mg/dL (ref 8.7–10.3)
Chloride: 103 mmol/L (ref 96–106)
Creatinine, Ser: 1.03 mg/dL — ABNORMAL HIGH (ref 0.57–1.00)
Globulin, Total: 2.4 g/dL (ref 1.5–4.5)
Glucose: 94 mg/dL (ref 65–99)
Potassium: 4.4 mmol/L (ref 3.5–5.2)
Sodium: 142 mmol/L (ref 134–144)
Total Protein: 7.1 g/dL (ref 6.0–8.5)
eGFR: 57 mL/min/{1.73_m2} — ABNORMAL LOW (ref >59)

## 2021-04-15 LAB — CBC
Hematocrit: 42.6 % (ref 34.0–46.6)
Hemoglobin: 14.1 g/dL (ref 11.1–15.9)
MCH: 29.8 pg (ref 26.6–33.0)
MCHC: 33.1 g/dL (ref 31.5–35.7)
MCV: 90 fL (ref 79–97)
Platelets: 250 10*3/uL (ref 150–450)
RBC: 4.73 x10E6/uL (ref 3.77–5.28)
RDW: 12.8 % (ref 11.7–15.4)
WBC: 4.7 10*3/uL (ref 3.4–10.8)

## 2021-04-15 LAB — LIPID PANEL
Chol/HDL Ratio: 2 ratio (ref 0.0–4.4)
Cholesterol, Total: 155 mg/dL (ref 100–199)
HDL: 76 mg/dL (ref >39)
LDL Chol Calc (NIH): 60 mg/dL (ref 0–99)
Triglycerides: 110 mg/dL (ref 0–149)
VLDL Cholesterol Cal: 19 mg/dL (ref 5–40)

## 2021-04-21 NOTE — Progress Notes (Signed)
Ensure she needs to drink at least 4 bottles of water a day. We will need to recheck her levels in 1 month (lab visit only) - BMP with GFR

## 2021-05-07 ENCOUNTER — Other Ambulatory Visit: Payer: Self-pay

## 2021-06-04 ENCOUNTER — Other Ambulatory Visit: Payer: Self-pay

## 2021-06-04 ENCOUNTER — Other Ambulatory Visit: Payer: Medicare HMO

## 2021-06-04 ENCOUNTER — Other Ambulatory Visit: Payer: Self-pay | Admitting: Nurse Practitioner

## 2021-06-04 DIAGNOSIS — N289 Disorder of kidney and ureter, unspecified: Secondary | ICD-10-CM | POA: Diagnosis not present

## 2021-06-05 LAB — BMP8+EGFR
BUN/Creatinine Ratio: 13 (ref 12–28)
BUN: 12 mg/dL (ref 8–27)
CO2: 23 mmol/L (ref 20–29)
Calcium: 10 mg/dL (ref 8.7–10.3)
Chloride: 106 mmol/L (ref 96–106)
Creatinine, Ser: 0.94 mg/dL (ref 0.57–1.00)
Glucose: 84 mg/dL (ref 65–99)
Potassium: 4.4 mmol/L (ref 3.5–5.2)
Sodium: 143 mmol/L (ref 134–144)
eGFR: 64 mL/min/{1.73_m2} (ref >59)

## 2021-06-17 ENCOUNTER — Telehealth: Payer: Self-pay

## 2021-06-17 ENCOUNTER — Telehealth: Payer: Medicare HMO

## 2021-06-17 ENCOUNTER — Other Ambulatory Visit: Payer: Self-pay | Admitting: Physician Assistant

## 2021-06-17 NOTE — Telephone Encounter (Signed)
  Care Management   Follow Up Note   06/17/2021 Name: Judy [name] MRN: [id number] DOB: 10/14/1945   Referred by: [name], FNP Reason for referral : Chronic Care Management (RN CM Follow up call )   An unsuccessful telephone outreach was attempted today. The patient was referred to the case management team for assistance with care management and care coordination.   Follow Up Plan: A HIPPA compliant phone message was left for the patient providing contact information and requesting a return call.   Barb Merino, RN, BSN, CCM Care Management Coordinator [place] [facility]  Direct Phone: [phone]

## 2021-06-18 NOTE — Telephone Encounter (Signed)
Please schedule appt

## 2021-06-19 NOTE — Telephone Encounter (Signed)
Pt has an appt 10/21

## 2021-07-31 ENCOUNTER — Other Ambulatory Visit: Payer: Self-pay

## 2021-07-31 ENCOUNTER — Telehealth: Payer: Self-pay

## 2021-07-31 ENCOUNTER — Ambulatory Visit: Payer: Medicare HMO | Admitting: Physician Assistant

## 2021-07-31 ENCOUNTER — Encounter: Payer: Self-pay | Admitting: Physician Assistant

## 2021-07-31 DIAGNOSIS — G2401 Drug induced subacute dyskinesia: Secondary | ICD-10-CM

## 2021-07-31 DIAGNOSIS — F3341 Major depressive disorder, recurrent, in partial remission: Secondary | ICD-10-CM | POA: Diagnosis not present

## 2021-07-31 DIAGNOSIS — F411 Generalized anxiety disorder: Secondary | ICD-10-CM

## 2021-07-31 DIAGNOSIS — R69 Illness, unspecified: Secondary | ICD-10-CM | POA: Diagnosis not present

## 2021-07-31 MED ORDER — BUPROPION HCL ER (XL) 150 MG PO TB24: 150.0000 mg | ORAL_TABLET | Freq: Every day | ORAL | 1 refills | Status: DC

## 2021-07-31 MED ORDER — VALBENAZINE TOSYLATE 80 MG PO CAPS: 80.0000 mg | ORAL_CAPSULE | Freq: Every day | ORAL | 11 refills | Status: DC

## 2021-07-31 MED ORDER — GABAPENTIN 100 MG PO CAPS: 100.0000 mg | ORAL_CAPSULE | Freq: Three times a day (TID) | ORAL | 1 refills | Status: DC

## 2021-07-31 NOTE — Telephone Encounter (Signed)
I just saw her and took care of it.  Thanks.

## 2021-07-31 NOTE — Progress Notes (Signed)
[facility] Med Check  Patient ID: Judy [name],  MRN: [id number]  PCP: [name], FNP  Date of Evaluation: 07/31/2021 Time spent:30 minutes  Chief Complaint:  Chief Complaint   Anxiety; Depression     HISTORY/CURRENT STATUS: HPI For routine med check.  States she is doing well except for anxiety at times.  Not having panic attacks just more of a sense of unease.  She sometimes feels a little sad and she will go to bed, take a nap and when she gets up she feels better.  She does not really do a whole lot due to her physical circumstances.  Is unable to walk well.  Energy and motivation are good.  Still lives alone.  Sleeps well most of the time.  Sometimes she does wake up about 4 AM and has a hard time going back to sleep but she feels rested when she wakes up.  No suicidal or homicidal thoughts.  The Julio Alm has been really helpful.  She does not have the abnormal mouth movements as much as she did before starting it.  No cough, throat clearing, guttural sounds or abnormal hand or movements.  She does have trouble walking but is no different than usual.  Review of Systems  Constitutional: Negative.   HENT: Negative.    Eyes: Negative.   Respiratory: Negative.    Cardiovascular: Negative.   Gastrointestinal: Negative.   Genitourinary: Negative.   Musculoskeletal: Negative.   Skin: Negative.   Neurological:        See HPI.  Endo/Heme/Allergies: Negative.   Psychiatric/Behavioral:         See HPI.    Individual Medical History/ Review of Systems: Changes? :No    Past medications for mental health diagnoses include: Latuda, Seroquel, Prozac, Fetzima, Risperdal, gabapentin, Wellbutrin  Allergies: Patient has no known allergies.  Current Medications:  Current Outpatient Medications:    atorvastatin (LIPITOR) 10 MG tablet, Take 1 tablet (10 mg total) by mouth daily., Disp: 90 tablet, Rfl: 1   gabapentin (NEURONTIN) 400 MG capsule, Take 1 capsule (400 mg total)  by mouth 3 (three) times daily., Disp: 270 capsule, Rfl: 1   buPROPion (WELLBUTRIN XL) 150 MG 24 hr tablet, Take 1 tablet (150 mg total) by mouth daily., Disp: 90 tablet, Rfl: 1   gabapentin (NEURONTIN) 100 MG capsule, Take 1 capsule (100 mg total) by mouth 3 (three) times daily. Take with 400 mg pills= 500 mg tid., Disp: 270 capsule, Rfl: 1   valbenazine (INGREZZA) 80 MG capsule, Take 1 capsule (80 mg total) by mouth daily., Disp: 30 capsule, Rfl: 11 Medication Side Effects: none  Family Medical/ Social History: Changes? no  MENTAL HEALTH EXAM:  There were no vitals taken for this visit.There is no height or weight on file to calculate BMI.  General Appearance: Casual, Neat and Well Groomed  Eye Contact:  Good  Speech:  Clear and Coherent and Normal Rate  Volume:  Normal  Mood:  Euthymic  Affect:  Appropriate  Thought Process:  Goal Directed and Descriptions of Associations: Circumstantial  Orientation:  Full (Time, Place, and Person)  Thought Content: Logical   Suicidal Thoughts:  No  Homicidal Thoughts:  No  Memory:  WNL  Judgement:  Good  Insight:  Good  Psychomotor Activity:   She walks slowly, on her tiptoes, I asked her to remove her mask for a few minutes and I see no abnormal mouth movements.  Concentration:  Concentration: Good  Recall:  Good  Fund of  Knowledge: Good  Language: Good  Assets:  Desire for Improvement  ADL's:  Intact  Cognition: WNL  Prognosis:  Fair    DIAGNOSES:    ICD-10-CM   1. Recurrent major depressive disorder, in partial remission ([place])  F33.41     2. Generalized anxiety disorder  F41.1     3. Tardive dyskinesia  G24.01        Receiving Psychotherapy: No    RECOMMENDATIONS:  PDMP was reviewed.  No results available. I provided 30 minutes of face to face time during this encounter, including time spent before and after the visit in records review, medical decision making, and charting.  We discussed the depression.  It seems like it  only last a few hours if that, we briefly discussed increasing the Wellbutrin but I do not think it is indicated and it may cause more anxiety.  She will call though if the depression worsens and then I will increase the Wellbutrin. Concerning the anxiety initially I considered increasing the gabapentin to 600 mg 3 times daily, but she just got a supply of the 400 mg pills and from mail-order.  To cut down on the confusion, I will still send a 100 mg pill and so she will have a total of 500 mg 3 times daily.  If the anxiety worsens and is debilitating, then call and I will send something else in.  Will have to be cautious no matter what because she lives alone and is risk for falls.   I am glad she is doing so much better on the Ingrezza.  No changes will be made. Continue Wellbutrin XL 150 mg, 1 p.o. every morning. Continue gabapentin 400 mg, 1 p.o. 3 times daily+100 mg tid=500 mg tid. (When she gets next RF, send in 600 mg tid.) Continue Ingrezza 80 mg, 1 p.o. daily. Return in 6 months.  Donnal Moat, PA-C

## 2021-08-11 ENCOUNTER — Encounter: Payer: Medicare HMO | Admitting: Nurse Practitioner

## 2021-08-17 ENCOUNTER — Ambulatory Visit (INDEPENDENT_AMBULATORY_CARE_PROVIDER_SITE_OTHER): Payer: Medicare HMO

## 2021-08-17 ENCOUNTER — Telehealth: Payer: Medicare HMO

## 2021-08-17 DIAGNOSIS — I10 Essential (primary) hypertension: Secondary | ICD-10-CM

## 2021-08-17 DIAGNOSIS — F3175 Bipolar disorder, in partial remission, most recent episode depressed: Secondary | ICD-10-CM

## 2021-08-17 DIAGNOSIS — E782 Mixed hyperlipidemia: Secondary | ICD-10-CM

## 2021-08-17 DIAGNOSIS — E876 Hypokalemia: Secondary | ICD-10-CM

## 2021-08-17 DIAGNOSIS — G2401 Drug induced subacute dyskinesia: Secondary | ICD-10-CM

## 2021-08-18 NOTE — Patient Instructions (Signed)
Visit Information   PATIENT GOALS/PLAN OF CARE:   Care Plan : RN Care Manager Plan of Care  Updates made by Judy Logan, RN since 08/17/2021 12:00 AM     Problem: No plan of care established for management of chronic disease states (HTN, Mixed Hyperlipidemia, Hypokalemia, Tardive dyskinesia, Depressed bipolar 1 disorder, partial remission)   Priority: High     Long-Range Goal: Development of plan of care for chronic disease management for HTN, Mixed Hyperlipidemia, Hypokalemia, Tardive dyskinesia, Depressed bipolar 1 disorder, partial remission   Start Date: 08/17/2021  Expected End Date: 08/17/2022  This Visit's Progress: On track  Priority: High  Note:   Current Barriers:  Knowledge Deficits related to plan of care for management of HTN, Mixed Hyperlipidemia, Hypokalemia, Tardive dyskinesia, Depressed bipolar 1 disorder, partial remission  Chronic Disease Management support and education needs related to HTN, Mixed Hyperlipidemia, Hypokalemia, Tardive dyskinesia, Depressed bipolar 1 disorder, partial remission  Financial Constraints  RNCM Clinical Goal(s):  Patient will verbalize basic understanding of  HTN, Mixed Hyperlipidemia, Hypokalemia, Tardive dyskinesia, Depressed bipolar 1 disorder, partial remission  disease process and self health management plan   demonstrate Improved health management independence   continue to work with RN Care Manager to address care management and care coordination needs related to  HTN, Mixed Hyperlipidemia, Hypokalemia, Tardive dyskinesia, Depressed bipolar 1 disorder, partial remission  will demonstrate ongoing self health care management ability    through collaboration with RN Care manager, provider, and care team.   Interventions: 1:1 collaboration with primary care provider regarding development and update of comprehensive plan of care as evidenced by provider attestation and co-signature Inter-disciplinary care team collaboration (see  longitudinal plan of care) Evaluation of current treatment plan related to  self management and patient's adherence to plan as established by provider  Hypertension Interventions: Last practice recorded BP readings:  BP Readings from Last 3 Encounters:  04/09/21 (!) 142/90  11/20/19 (!) 119/57  11/08/19 121/72  Most recent eGFR/CrCl:  Lab Results  Component Value Date   EGFR 64 06/04/2021    No components found for: CRCL  Evaluation of current treatment plan related to hypertension self management and patient's adherence to plan as established by provider; Counseled on the importance of exercise goals with target of 150 minutes per week Discussed plans with patient for ongoing care management follow up and provided patient with direct contact information for care management team; Advised patient, providing education and rationale, to monitor blood pressure daily and record, calling PCP for findings outside established parameters;  Provided education on prescribed diet low Sodium;  Discussed complications of poorly controlled blood pressure such as heart disease, stroke, circulatory complications, vision complications, kidney impairment, sexual dysfunction;  Screening for signs and symptoms of depression related to chronic disease state;  Assessed social determinant of health barriers;  Mailed printed educational materials related to How to Accurately Self Monitor BP at home; Why Should I Restrict Sodium?  Hyperlipidemia Interventions: Medication review performed; medication list updated in electronic medical record.  Provider established cholesterol goals reviewed; Counseled on importance of regular laboratory monitoring as prescribed; Provided HLD educational materials; Reviewed importance of limiting foods high in cholesterol; Reviewed exercise goals and target of 150 minutes per week; New goal. Evaluation of current treatment plan related to Depression, self-management and  patient's adherence to plan as established by provider. Discussed plans with patient for ongoing care management follow up and provided patient with direct contact information for care management team Reviewed medications with  patient and discussed indication, dosage and frequency of prescribed medications for treatment of depression; Educated on potential SE and when to call her doctor if symptoms occur ; Discussed plans with patient for ongoing care management follow up and provided patient with direct contact information for care management team; Assessed social determinant of health barriers;   Patient Goals/Self-Care Activities: Patient will self administer medications as prescribed Patient will attend all scheduled provider appointments Patient will call pharmacy for medication refills Patient will attend church or other social activities Patient will continue to perform ADL's independently Patient will continue to perform IADL's independently Patient will call provider office for new concerns or questions  Follow Up Plan:  Telephone follow up appointment with care management team member scheduled for:  11/02/20      Consent to CCM Services: Judy [name] was given information about Chronic Care Management services including:  CCM service includes personalized support from designated clinical staff supervised by her physician, including individualized plan of care and coordination with other care providers 24/7 contact phone numbers for assistance for urgent and routine care needs. Service will only be billed when office clinical staff spend 20 minutes or more in a month to coordinate care. Only one practitioner may furnish and bill the service in a calendar month. The patient may stop CCM services at any time (effective at the end of the month) by phone call to the office staff. The patient will be responsible for cost sharing (co-pay) of up to 20% of the service fee (after annual  deductible is met).  Patient agreed to services and verbal consent obtained.   The patient verbalized understanding of instructions, educational materials, and care plan provided today and declined offer to receive copy of patient instructions, educational materials, and care plan.   Telephone follow up appointment with care management team member scheduled for: 11/02/21  Barb Merino, RN, BSN, CCM Care Management Coordinator [place] [facility]  Direct Phone: [phone]

## 2021-08-18 NOTE — Chronic Care Management (AMB) (Signed)
Chronic Care Management   CCM RN Visit Note  08/17/2021 Name: Judy [name] MRN: [id number] DOB: [birth year]  Subjective: Judy [name] is a 75 y.o. year old female who is a primary care patient of [name], [place]. The care management team was consulted for assistance with disease management and care coordination needs.    Engaged with patient by telephone for follow up visit in response to provider referral for case management and/or care coordination services.   Consent to Services:  The patient was given information about Chronic Care Management services, agreed to services, and gave verbal consent prior to initiation of services.  Please see initial visit note for detailed documentation.   Patient agreed to services and verbal consent obtained.   Assessment: Review of patient past medical history, allergies, medications, health status, including review of consultants reports, laboratory and other test data, was performed as part of comprehensive evaluation and provision of chronic care management services.   SDOH (Social Determinants of Health) assessments and interventions performed:  Yes, no acute challenges  CCM Care Plan  No Known Allergies  Outpatient Encounter Medications as of 08/17/2021  Medication Sig   atorvastatin (LIPITOR) 10 MG tablet Take 1 tablet (10 mg total) by mouth daily.   buPROPion (WELLBUTRIN XL) 150 MG 24 hr tablet Take 1 tablet (150 mg total) by mouth daily.   gabapentin (NEURONTIN) 100 MG capsule Take 1 capsule (100 mg total) by mouth 3 (three) times daily. Take with 400 mg pills= 500 mg tid.   gabapentin (NEURONTIN) 400 MG capsule Take 1 capsule (400 mg total) by mouth 3 (three) times daily.   valbenazine (INGREZZA) 80 MG capsule Take 1 capsule (80 mg total) by mouth daily.   No facility-administered encounter medications on file as of 08/17/2021.    Patient Active Problem List   Diagnosis Date Noted   Pulmonary embolism ([place]) 11/15/2019    Spinal stenosis 11/07/2019   Weakness of both lower extremities 11/06/2019   Generalized anxiety disorder 07/24/2018   Solitary pulmonary nodule on lung CT 06/13/2017   Spinal stenosis at L4-L5 level    Dysphagia 05/25/2017   Tardive dyskinesia 05/25/2017   Dyspnea on exertion 05/25/2017   Fall 05/25/2017   Bilateral leg pain    Chronic pain of right knee 03/25/2017   High risk medication use 03/25/2017   Family hx of colon cancer 03/25/2017   Occupational exposure in workplace 03/25/2017   Depressed bipolar I disorder in partial remission ([place]) 03/25/2017   Routine general medical examination at a health care facility 06/04/2014   Hyperlipidemia with target LDL less than 130 03/20/2014   Essential hypertension, benign 03/20/2014   Unspecified constipation 03/20/2014   Light headedness 03/20/2014   Acute on chronic kidney disease, stage 3 03/20/2014   Hypokalemia 03/20/2014   Encounter for therapeutic drug monitoring 03/20/2014   Other dysphagia 12/26/2013   GERD (gastroesophageal reflux disease) 12/21/2013   Constipation 12/21/2013   Syncope 12/10/2013   Leukocytosis 12/10/2013   URI (upper respiratory infection) 12/10/2013   Bipolar 1 disorder (HCC)    Obesity (BMI 30-39.9)    Hearing loss in left ear    Vertigo    HYPERCHOLESTEROLEMIA 10/08/2010   DEPRESSION 10/08/2010   HYPERTENSION 10/08/2010   ALLERGIC RHINITIS 10/08/2010    Conditions to be addressed/monitored: HTN, Mixed Hyperlipidemia, Hypokalemia, Tardive dyskinesia, Depressed bipolar 1 disorder, partial remission   Care Plan : RN Care Manager Plan of Care  Updates made by Lynne Logan, RN since 08/17/2021  12:00 AM     Problem: No plan of care established for management of chronic disease states (HTN, Mixed Hyperlipidemia, Hypokalemia, Tardive dyskinesia, Depressed bipolar 1 disorder, partial remission)   Priority: High     Long-Range Goal: Development of plan of care for chronic disease management for  HTN, Mixed Hyperlipidemia, Hypokalemia, Tardive dyskinesia, Depressed bipolar 1 disorder, partial remission   Start Date: 08/17/2021  Expected End Date: 08/17/2022  This Visit's Progress: On track  Priority: High  Note:   Current Barriers:  Knowledge Deficits related to plan of care for management of HTN, Mixed Hyperlipidemia, Hypokalemia, Tardive dyskinesia, Depressed bipolar 1 disorder, partial remission  Chronic Disease Management support and education needs related to HTN, Mixed Hyperlipidemia, Hypokalemia, Tardive dyskinesia, Depressed bipolar 1 disorder, partial remission  Financial Constraints  RNCM Clinical Goal(s):  Patient will verbalize basic understanding of  HTN, Mixed Hyperlipidemia, Hypokalemia, Tardive dyskinesia, Depressed bipolar 1 disorder, partial remission  disease process and self health management plan   demonstrate Improved health management independence   continue to work with RN Care Manager to address care management and care coordination needs related to  HTN, Mixed Hyperlipidemia, Hypokalemia, Tardive dyskinesia, Depressed bipolar 1 disorder, partial remission  will demonstrate ongoing self health care management ability    through collaboration with RN Care manager, provider, and care team.   Interventions: 1:1 collaboration with primary care provider regarding development and update of comprehensive plan of care as evidenced by provider attestation and co-signature Inter-disciplinary care team collaboration (see longitudinal plan of care) Evaluation of current treatment plan related to  self management and patient's adherence to plan as established by provider  Hypertension Interventions: Last practice recorded BP readings:  BP Readings from Last 3 Encounters:  04/09/21 (!) 142/90  11/20/19 (!) 119/57  11/08/19 121/72  Most recent eGFR/CrCl:  Lab Results  Component Value Date   EGFR 64 06/04/2021    No components found for: CRCL  Evaluation of current  treatment plan related to hypertension self management and patient's adherence to plan as established by provider; Counseled on the importance of exercise goals with target of 150 minutes per week Discussed plans with patient for ongoing care management follow up and provided patient with direct contact information for care management team; Advised patient, providing education and rationale, to monitor blood pressure daily and record, calling PCP for findings outside established parameters;  Provided education on prescribed diet low Sodium;  Discussed complications of poorly controlled blood pressure such as heart disease, stroke, circulatory complications, vision complications, kidney impairment, sexual dysfunction;  Screening for signs and symptoms of depression related to chronic disease state;  Assessed social determinant of health barriers;  Mailed printed educational materials related to How to Accurately Self Monitor BP at home; Why Should I Restrict Sodium?  Hyperlipidemia Interventions: Medication review performed; medication list updated in electronic medical record.  Provider established cholesterol goals reviewed; Counseled on importance of regular laboratory monitoring as prescribed; Provided HLD educational materials; Reviewed importance of limiting foods high in cholesterol; Reviewed exercise goals and target of 150 minutes per week; New goal. Evaluation of current treatment plan related to Depression, self-management and patient's adherence to plan as established by provider. Discussed plans with patient for ongoing care management follow up and provided patient with direct contact information for care management team Reviewed medications with patient and discussed indication, dosage and frequency of prescribed medications for treatment of depression; Educated on potential SE and when to call her doctor if symptoms occur ; Discussed  plans with patient for ongoing care management  follow up and provided patient with direct contact information for care management team; Assessed social determinant of health barriers;   Patient Goals/Self-Care Activities: Patient will self administer medications as prescribed Patient will attend all scheduled provider appointments Patient will call pharmacy for medication refills Patient will attend church or other social activities Patient will continue to perform ADL's independently Patient will continue to perform IADL's independently Patient will call provider office for new concerns or questions  Follow Up Plan:  Telephone follow up appointment with care management team member scheduled for:  11/02/20      Plan:Telephone follow up appointment with care management team member scheduled for:  11/02/21  Barb Merino, RN, BSN, CCM Care Management Coordinator [place] [facility]  Direct Phone: [phone]

## 2021-08-28 ENCOUNTER — Other Ambulatory Visit: Payer: Self-pay

## 2021-08-28 MED ORDER — GABAPENTIN 600 MG PO TABS: 600.0000 mg | ORAL_TABLET | Freq: Three times a day (TID) | ORAL | 0 refills | Status: DC

## 2021-09-01 ENCOUNTER — Telehealth: Payer: Medicare HMO

## 2021-09-09 DIAGNOSIS — F3175 Bipolar disorder, in partial remission, most recent episode depressed: Secondary | ICD-10-CM

## 2021-09-09 DIAGNOSIS — E782 Mixed hyperlipidemia: Secondary | ICD-10-CM | POA: Diagnosis not present

## 2021-09-09 DIAGNOSIS — I10 Essential (primary) hypertension: Secondary | ICD-10-CM

## 2021-09-09 DIAGNOSIS — R69 Illness, unspecified: Secondary | ICD-10-CM | POA: Diagnosis not present

## 2021-09-14 ENCOUNTER — Ambulatory Visit (INDEPENDENT_AMBULATORY_CARE_PROVIDER_SITE_OTHER): Payer: Medicare HMO

## 2021-09-14 ENCOUNTER — Telehealth: Payer: Medicare HMO

## 2021-09-14 DIAGNOSIS — I10 Essential (primary) hypertension: Secondary | ICD-10-CM

## 2021-09-14 DIAGNOSIS — E876 Hypokalemia: Secondary | ICD-10-CM

## 2021-09-14 DIAGNOSIS — F3175 Bipolar disorder, in partial remission, most recent episode depressed: Secondary | ICD-10-CM

## 2021-09-14 DIAGNOSIS — G2401 Drug induced subacute dyskinesia: Secondary | ICD-10-CM

## 2021-09-14 DIAGNOSIS — E782 Mixed hyperlipidemia: Secondary | ICD-10-CM

## 2021-09-14 NOTE — Patient Instructions (Signed)
Visit Information   Thank you for taking time to visit with me today. Please don't hesitate to contact me if I can be of assistance to you before our next scheduled telephone appointment.  Following are the goals we discussed today:  (Copy and paste patient goals from clinical care plan here)  Our next appointment is by telephone on 11/02/21 at 11:00 AM   Please call the care guide team at [phone] if you need to cancel or reschedule your appointment.   If you are experiencing a Mental Health or [place] or need someone to talk to, please call 1-800-273-TALK (toll free, 24 hour hotline)   Following is a copy of your full care plan:  Care Plan : [place] of Care  Updates made by Lynne Logan, RN since 09/14/2021 12:00 AM     Problem: No plan of care established for management of chronic disease states (HTN, Mixed Hyperlipidemia, Hypokalemia, Tardive dyskinesia, Depressed bipolar 1 disorder, partial remission)   Priority: High     Long-Range Goal: Development of plan of care for chronic disease management for HTN, Mixed Hyperlipidemia, Hypokalemia, Tardive dyskinesia, Depressed bipolar 1 disorder, partial remission   Start Date: 08/17/2021  Expected End Date: 08/17/2022  Recent Progress: On track  Priority: High  Note:   Current Barriers:  Knowledge Deficits related to plan of care for management of HTN, Mixed Hyperlipidemia, Hypokalemia, Tardive dyskinesia, Depressed bipolar 1 disorder, partial remission  Chronic Disease Management support and education needs related to HTN, Mixed Hyperlipidemia, Hypokalemia, Tardive dyskinesia, Depressed bipolar 1 disorder, partial remission  Financial Constraints  RNCM Clinical Goal(s):  Patient will verbalize basic understanding of  HTN, Mixed Hyperlipidemia, Hypokalemia, Tardive dyskinesia, Depressed bipolar 1 disorder, partial remission  disease process and self health management plan   demonstrate Improved health  management independence   continue to work with RN Care Manager to address care management and care coordination needs related to  HTN, Mixed Hyperlipidemia, Hypokalemia, Tardive dyskinesia, Depressed bipolar 1 disorder, partial remission  will demonstrate ongoing self health care management ability    through collaboration with RN Care manager, provider, and care team.   Interventions: 1:1 collaboration with primary care provider regarding development and update of comprehensive plan of care as evidenced by provider attestation and co-signature Inter-disciplinary care team collaboration (see longitudinal plan of care) Evaluation of current treatment plan related to  self management and patient's adherence to plan as established by provider  Hypertension Interventions: (Status: Goal on track: Yes) Last practice recorded BP readings:  BP Readings from Last 3 Encounters:  04/09/21 (!) 142/90  11/20/19 (!) 119/57  11/08/19 121/72  Most recent eGFR/CrCl:  Lab Results  Component Value Date   EGFR 64 06/04/2021    No components found for: CRCL  Evaluation of current treatment plan related to hypertension self management and patient's adherence to plan as established by provider Provided education on prescribed diet low Sodium Discussed complications of poorly controlled blood pressure such as heart disease, stroke, circulatory complications, vision complications, kidney impairment, sexual dysfunction  Determined patient has not been able to self monitor her BP at home  Reviewed next scheduled appointment with PCP set for 10/29/21 $RemoveBef'@2'[id number]$ :00 PM for BP check and consideration of initiating antihypertensive medications Discussed patient will address her BP concerns with PCP at next visit Educated patient on dietary recommendations for following a low Sodium diet  Discussed plans with patient for ongoing care management follow up and provided patient with direct contact  information for care management  team Hyperlipidemia Interventions: (Status: Condition stable. Not addressed at this call.) Medication review performed; medication list updated in electronic medical record.  Provider established cholesterol goals reviewed; Counseled on importance of regular laboratory monitoring as prescribed; Provided HLD educational materials; Reviewed importance of limiting foods high in cholesterol; Reviewed exercise goals and target of 150 minutes per week; New goal. Evaluation of current treatment plan related to Depression, self-management and patient's adherence to plan as established by provider. Discussed plans with patient for ongoing care management follow up and provided patient with direct contact information for care management team Reviewed medications with patient and discussed indication, dosage and frequency of prescribed medications for treatment of depression; Educated on potential SE and when to call her doctor if symptoms occur ; Discussed plans with patient for ongoing care management follow up and provided patient with direct contact information for care management team; Assessed social determinant of health barriers;   Urinary frequency Interventions:  (Status:  New goal.)  Short Term Goal Evaluation of current treatment plan related to  Urinary frequency , self-management and patient's adherence to plan as established by provider Discussed patient is experiencing urinary incontinence and urinary frequency, she wears depends Assessed for signs/symptoms of urinary tract infection, patient denies having other symptoms Encouraged patient to discuss her symptoms with PCP, she will wait until her next scheduled visit  Reviewed scheduled/upcoming provider appointments including: next PCP follow up appointment scheduled for 10/29/21 $RemoveBef'@2'[id number]$ :00 PM  Educated patient on the importance of staying well hydrated and avoiding caffeine that may increase urinary frequency Reviewed signs and symptoms  suggestive of UTI and when to seek medical attention if needed; Educated on potential complications if a UTI occurs and is left untreated Discussed plans with patient for ongoing care management follow up and provided patient with direct contact information for care management team  Patient Goals/Self-Care Activities: Take all medications as prescribed Attend all scheduled provider appointments Call pharmacy for medication refills 3-7 days in advance of running out of medications Call provider office for new concerns or questions  Keep PCP follow up appointment as scheduled with PCP as discussed Adhere to low Sodium diet   Follow Up Plan:  Telephone follow up appointment with care management team member scheduled for:  11/02/21      Consent to CCM Services: Judy [name] was given information about Chronic Care Management services including:  CCM service includes personalized support from designated clinical staff supervised by her physician, including individualized plan of care and coordination with other care providers 24/7 contact phone numbers for assistance for urgent and routine care needs. Service will only be billed when office clinical staff spend 20 minutes or more in a month to coordinate care. Only one practitioner may furnish and bill the service in a calendar month. The patient may stop CCM services at any time (effective at the end of the month) by phone call to the office staff. The patient will be responsible for cost sharing (co-pay) of up to 20% of the service fee (after annual deductible is met).  Patient agreed to services and verbal consent obtained.   The patient verbalized understanding of instructions, educational materials, and care plan provided today and declined offer to receive copy of patient instructions, educational materials, and care plan.   Telephone follow up appointment with care management team member scheduled for: 11/02/21

## 2021-09-14 NOTE — Chronic Care Management (AMB) (Signed)
Chronic Care Management   CCM RN Visit Note  09/14/2021 Name: Judy [name] MRN: [id number] DOB: [birth year]  Subjective: Judy [name] is a 75 y.o. year old female who is a primary care patient of [name], [place]. The care management team was consulted for assistance with disease management and care coordination needs.    Engaged with patient by telephone for follow up visit in response to provider referral for case management and/or care coordination services.   Consent to Services:  The patient was given information about Chronic Care Management services, agreed to services, and gave verbal consent prior to initiation of services.  Please see initial visit note for detailed documentation.   Patient agreed to services and verbal consent obtained.   Assessment: Review of patient past medical history, allergies, medications, health status, including review of consultants reports, laboratory and other test data, was performed as part of comprehensive evaluation and provision of chronic care management services.   SDOH (Social Determinants of Health) assessments and interventions performed:  Yes, patient continues to have issues with transportation, denies need for SW referral   CCM Care Plan  No Known Allergies  Outpatient Encounter Medications as of 09/14/2021  Medication Sig   atorvastatin (LIPITOR) 10 MG tablet Take 1 tablet (10 mg total) by mouth daily.   buPROPion (WELLBUTRIN XL) 150 MG 24 hr tablet Take 1 tablet (150 mg total) by mouth daily.   gabapentin (NEURONTIN) 100 MG capsule Take 1 capsule (100 mg total) by mouth 3 (three) times daily. Take with 400 mg pills= 500 mg tid.   gabapentin (NEURONTIN) 400 MG capsule Take 1 capsule (400 mg total) by mouth 3 (three) times daily.   gabapentin (NEURONTIN) 600 MG tablet Take 1 tablet (600 mg total) by mouth 3 (three) times daily.   valbenazine (INGREZZA) 80 MG capsule Take 1 capsule (80 mg total) by mouth daily.   No  facility-administered encounter medications on file as of 09/14/2021.    Patient Active Problem List   Diagnosis Date Noted   Pulmonary embolism ([place]) 11/15/2019   Spinal stenosis 11/07/2019   Weakness of both lower extremities 11/06/2019   Generalized anxiety disorder 07/24/2018   Solitary pulmonary nodule on lung CT 06/13/2017   Spinal stenosis at L4-L5 level    Dysphagia 05/25/2017   Tardive dyskinesia 05/25/2017   Dyspnea on exertion 05/25/2017   Fall 05/25/2017   Bilateral leg pain    Chronic pain of right knee 03/25/2017   High risk medication use 03/25/2017   Family hx of colon cancer 03/25/2017   Occupational exposure in workplace 03/25/2017   Depressed bipolar I disorder in partial remission ([place]) 03/25/2017   Routine general medical examination at a health care facility 06/04/2014   Hyperlipidemia with target LDL less than 130 03/20/2014   Essential hypertension, benign 03/20/2014   Unspecified constipation 03/20/2014   Light headedness 03/20/2014   Acute on chronic kidney disease, stage 3 03/20/2014   Hypokalemia 03/20/2014   Encounter for therapeutic drug monitoring 03/20/2014   Other dysphagia 12/26/2013   GERD (gastroesophageal reflux disease) 12/21/2013   Constipation 12/21/2013   Syncope 12/10/2013   Leukocytosis 12/10/2013   URI (upper respiratory infection) 12/10/2013   Bipolar 1 disorder (HCC)    Obesity (BMI 30-39.9)    Hearing loss in left ear    Vertigo    HYPERCHOLESTEROLEMIA 10/08/2010   DEPRESSION 10/08/2010   HYPERTENSION 10/08/2010   ALLERGIC RHINITIS 10/08/2010    Conditions to be addressed/monitored: HTN, Mixed Hyperlipidemia, Hypokalemia,  Tardive dyskinesia, Depressed bipolar 1 disorder, partial remission   Care Plan : RN Care Manager Plan of Care  Updates made by Judy Logan, RN since 09/14/2021 12:00 AM     Problem: No plan of care established for management of chronic disease states (HTN, Mixed Hyperlipidemia, Hypokalemia, Tardive  dyskinesia, Depressed bipolar 1 disorder, partial remission)   Priority: High     Long-Range Goal: Development of plan of care for chronic disease management for HTN, Mixed Hyperlipidemia, Hypokalemia, Tardive dyskinesia, Depressed bipolar 1 disorder, partial remission   Start Date: 08/17/2021  Expected End Date: 08/17/2022  Recent Progress: On track  Priority: High  Note:   Current Barriers:  Knowledge Deficits related to plan of care for management of HTN, Mixed Hyperlipidemia, Hypokalemia, Tardive dyskinesia, Depressed bipolar 1 disorder, partial remission  Chronic Disease Management support and education needs related to HTN, Mixed Hyperlipidemia, Hypokalemia, Tardive dyskinesia, Depressed bipolar 1 disorder, partial remission  Financial Constraints  RNCM Clinical Goal(s):  Patient will verbalize basic understanding of  HTN, Mixed Hyperlipidemia, Hypokalemia, Tardive dyskinesia, Depressed bipolar 1 disorder, partial remission  disease process and self health management plan   demonstrate Improved health management independence   continue to work with RN Care Manager to address care management and care coordination needs related to  HTN, Mixed Hyperlipidemia, Hypokalemia, Tardive dyskinesia, Depressed bipolar 1 disorder, partial remission  will demonstrate ongoing self health care management ability    through collaboration with RN Care manager, provider, and care team.   Interventions: 1:1 collaboration with primary care provider regarding development and update of comprehensive plan of care as evidenced by provider attestation and co-signature Inter-disciplinary care team collaboration (see longitudinal plan of care) Evaluation of current treatment plan related to  self management and patient's adherence to plan as established by provider  Hypertension Interventions: (Status: Goal on track: Yes) Last practice recorded BP readings:  BP Readings from Last 3 Encounters:  04/09/21 (!)  142/90  11/20/19 (!) 119/57  11/08/19 121/72  Most recent eGFR/CrCl:  Lab Results  Component Value Date   EGFR 64 06/04/2021    No components found for: CRCL  Evaluation of current treatment plan related to hypertension self management and patient's adherence to plan as established by provider Provided education on prescribed diet low Sodium Discussed complications of poorly controlled blood pressure such as heart disease, stroke, circulatory complications, vision complications, kidney impairment, sexual dysfunction  Determined patient has not been able to self monitor her BP at home  Reviewed next scheduled appointment with PCP set for 10/29/21 $RemoveBef'@2'qfranlISzF$ :00 PM for BP check and consideration of initiating antihypertensive medications Discussed patient will address her BP concerns with PCP at next visit Educated patient on dietary recommendations for following a low Sodium diet  Discussed plans with patient for ongoing care management follow up and provided patient with direct contact information for care management team Hyperlipidemia Interventions: (Status: Condition stable. Not addressed at this call.) Medication review performed; medication list updated in electronic medical record.  Provider established cholesterol goals reviewed; Counseled on importance of regular laboratory monitoring as prescribed; Provided HLD educational materials; Reviewed importance of limiting foods high in cholesterol; Reviewed exercise goals and target of 150 minutes per week; New goal. Evaluation of current treatment plan related to Depression, self-management and patient's adherence to plan as established by provider. Discussed plans with patient for ongoing care management follow up and provided patient with direct contact information for care management team Reviewed medications with patient and discussed indication, dosage and  frequency of prescribed medications for treatment of depression; Educated on potential  SE and when to call her doctor if symptoms occur ; Discussed plans with patient for ongoing care management follow up and provided patient with direct contact information for care management team; Assessed social determinant of health barriers;   Urinary frequency Interventions:  (Status:  New goal.)  Short Term Goal Evaluation of current treatment plan related to  Urinary frequency , self-management and patient's adherence to plan as established by provider Discussed patient is experiencing urinary incontinence and urinary frequency, she wears depends Assessed for signs/symptoms of urinary tract infection, patient denies having other symptoms Encouraged patient to discuss her symptoms with PCP, she will wait until her next scheduled visit  Reviewed scheduled/upcoming provider appointments including: next PCP follow up appointment scheduled for 10/29/21 $RemoveBef'@2'KZqTZQrkhj$ :00 PM  Educated patient on the importance of staying well hydrated and avoiding caffeine that may increase urinary frequency Reviewed signs and symptoms suggestive of UTI and when to seek medical attention if needed; Educated on potential complications if a UTI occurs and is left untreated Discussed plans with patient for ongoing care management follow up and provided patient with direct contact information for care management team  Patient Goals/Self-Care Activities: Take all medications as prescribed Attend all scheduled provider appointments Call pharmacy for medication refills 3-7 days in advance of running out of medications Call provider office for new concerns or questions  Keep PCP follow up appointment as scheduled with PCP as discussed Adhere to low Sodium diet   Follow Up Plan:  Telephone follow up appointment with care management team member scheduled for:  11/02/21      Plan:Telephone follow up appointment with care management team member scheduled for:  11/02/21  Barb Merino, RN, BSN, CCM Care Management Coordinator [facility]  Direct Phone: [phone]

## 2021-09-16 ENCOUNTER — Other Ambulatory Visit: Payer: Self-pay

## 2021-09-16 ENCOUNTER — Telehealth: Payer: Self-pay | Admitting: Physician Assistant

## 2021-09-16 MED ORDER — GABAPENTIN 400 MG PO CAPS: 400.0000 mg | ORAL_CAPSULE | Freq: Three times a day (TID) | ORAL | 0 refills | Status: DC

## 2021-09-16 NOTE — Telephone Encounter (Signed)
[place] at [facility] says they need a new script for the Neurotin 400mg  for the pts. Reference Order #is [id number]  Pls call him at [phone] opt 2  Next appt 4/20

## 2021-09-16 NOTE — Telephone Encounter (Signed)
Rx sent 

## 2021-10-10 DIAGNOSIS — R69 Illness, unspecified: Secondary | ICD-10-CM | POA: Diagnosis not present

## 2021-10-10 DIAGNOSIS — I10 Essential (primary) hypertension: Secondary | ICD-10-CM | POA: Diagnosis not present

## 2021-10-10 DIAGNOSIS — F3175 Bipolar disorder, in partial remission, most recent episode depressed: Secondary | ICD-10-CM

## 2021-10-10 DIAGNOSIS — E782 Mixed hyperlipidemia: Secondary | ICD-10-CM | POA: Diagnosis not present

## 2021-10-19 ENCOUNTER — Other Ambulatory Visit: Payer: Self-pay

## 2021-10-19 DIAGNOSIS — E782 Mixed hyperlipidemia: Secondary | ICD-10-CM

## 2021-10-19 MED ORDER — ATORVASTATIN CALCIUM 10 MG PO TABS: 10.0000 mg | ORAL_TABLET | Freq: Every day | ORAL | 1 refills | Status: DC

## 2021-10-29 ENCOUNTER — Ambulatory Visit (INDEPENDENT_AMBULATORY_CARE_PROVIDER_SITE_OTHER): Payer: Medicare HMO | Admitting: Nurse Practitioner

## 2021-10-29 ENCOUNTER — Encounter: Payer: Self-pay | Admitting: Nurse Practitioner

## 2021-10-29 ENCOUNTER — Other Ambulatory Visit: Payer: Self-pay

## 2021-10-29 VITALS — BP 136/80 | HR 81 | Temp 98.6°F | Ht 69.0 in | Wt 182.0 lb

## 2021-10-29 DIAGNOSIS — R350 Frequency of micturition: Secondary | ICD-10-CM

## 2021-10-29 DIAGNOSIS — H6121 Impacted cerumen, right ear: Secondary | ICD-10-CM

## 2021-10-29 DIAGNOSIS — Z1211 Encounter for screening for malignant neoplasm of colon: Secondary | ICD-10-CM

## 2021-10-29 DIAGNOSIS — E782 Mixed hyperlipidemia: Secondary | ICD-10-CM

## 2021-10-29 DIAGNOSIS — R69 Illness, unspecified: Secondary | ICD-10-CM | POA: Diagnosis not present

## 2021-10-29 DIAGNOSIS — I10 Essential (primary) hypertension: Secondary | ICD-10-CM

## 2021-10-29 DIAGNOSIS — Z0000 Encounter for general adult medical examination without abnormal findings: Secondary | ICD-10-CM

## 2021-10-29 DIAGNOSIS — N39498 Other specified urinary incontinence: Secondary | ICD-10-CM | POA: Diagnosis not present

## 2021-10-29 DIAGNOSIS — R82998 Other abnormal findings in urine: Secondary | ICD-10-CM

## 2021-10-29 DIAGNOSIS — Z1159 Encounter for screening for other viral diseases: Secondary | ICD-10-CM

## 2021-10-29 DIAGNOSIS — F322 Major depressive disorder, single episode, severe without psychotic features: Secondary | ICD-10-CM | POA: Diagnosis not present

## 2021-10-29 LAB — POCT URINALYSIS DIPSTICK
Bilirubin, UA: NEGATIVE
Blood, UA: NEGATIVE
Glucose, UA: NEGATIVE
Nitrite, UA: NEGATIVE
Protein, UA: NEGATIVE
Spec Grav, UA: 1.02 (ref 1.010–1.025)
Urobilinogen, UA: 1 E.U./dL
pH, UA: 7 (ref 5.0–8.0)

## 2021-10-29 NOTE — Patient Instructions (Signed)

## 2021-10-29 NOTE — Progress Notes (Signed)
I,Tianna Badgett,acting as a Education administrator for [employer], FNP.,have documented all relevant documentation on the behalf of [name], FNP,as directed by  [name], FNP while in the presence of [name], [place].  This visit occurred during the SARS-CoV-2 public health emergency.  Safety protocols were in place, including screening questions prior to the visit, additional usage of staff PPE, and extensive cleaning of exam room while observing appropriate contact time as indicated for disinfecting solutions.  Subjective:     Patient ID: Judy [name] , female    DOB: [birth year] , 76 y.o.   MRN: [id number]   Chief Complaint  Patient presents with   Annual Exam    HPI  The patient is here for HM.    Hypertension This is a chronic problem. The current episode started more than 1 year ago. The problem is unchanged. The problem is controlled. Pertinent negatives include no anxiety, chest pain, headaches or palpitations. Risk factors for coronary artery disease include sedentary lifestyle. Past treatments include calcium channel blockers. There are no compliance problems.     Past Medical History:  Diagnosis Date   Allergic rhinitis    Anemia    Anxiety    Arthritis    Bipolar 1 disorder ([place])    Depression    Diverticulosis of colon (without mention of hemorrhage) 08/25/2011   [name]   Hearing loss in left ear    since birth   Hyperlipidemia    Hypertension    Obesity    Renal disorder    Vertigo      Family History  Problem Relation Age of Onset   Prostate cancer Brother    Hypertension Brother    Kidney disease Brother    Colon cancer Brother    Hypertension Mother    Kidney disease Mother    Stomach cancer Father    Hyperlipidemia Sister    Hypothyroidism Sister    Stroke Brother    Prostate cancer Brother      Current Outpatient Medications:    atorvastatin (LIPITOR) 10 MG tablet, Take 1 tablet (10 mg total) by mouth daily., Disp: 90 tablet, Rfl: 1    buPROPion (WELLBUTRIN XL) 150 MG 24 hr tablet, Take 1 tablet (150 mg total) by mouth daily., Disp: 90 tablet, Rfl: 1   gabapentin (NEURONTIN) 100 MG capsule, Take 1 capsule (100 mg total) by mouth 3 (three) times daily. Take with 400 mg pills= 500 mg tid., Disp: 270 capsule, Rfl: 1   gabapentin (NEURONTIN) 400 MG capsule, Take 1 capsule (400 mg total) by mouth 3 (three) times daily., Disp: 270 capsule, Rfl: 0   gabapentin (NEURONTIN) 600 MG tablet, Take 1 tablet (600 mg total) by mouth 3 (three) times daily., Disp: 90 tablet, Rfl: 0   valbenazine (INGREZZA) 80 MG capsule, Take 1 capsule (80 mg total) by mouth daily., Disp: 30 capsule, Rfl: 11   No Known Allergies    The patient states she is post menopausal status.  No LMP recorded. Patient is postmenopausal.. Negative for Dysmenorrhea and Negative for Menorrhagia. Negative for: breast discharge, breast lump(s), breast pain and breast self exam. Associated symptoms include abnormal vaginal bleeding. Pertinent negatives include abnormal bleeding (hematology), anxiety, decreased libido, depression, difficulty falling sleep, dyspareunia, history of infertility, nocturia, sexual dysfunction, sleep disturbances, urinary incontinence, urinary urgency, vaginal discharge and vaginal itching. Diet regular, she gets meals on wheels. The patient states her exercise level is minimal but she is walking up and down her walkway to the  mailbox.   The patient's tobacco use is:  Social History   Tobacco Use  Smoking Status Never  Smokeless Tobacco Never   She has been exposed to passive smoke. The patient's alcohol use is:  Social History   Substance and Sexual Activity  Alcohol Use No      Review of Systems  Constitutional: Negative.   HENT: Negative.  Negative for congestion and sneezing.   Eyes: Negative.   Respiratory: Negative.  Negative for cough and wheezing.   Cardiovascular: Negative.  Negative for chest pain and palpitations.   Gastrointestinal: Negative.  Negative for abdominal pain.  Endocrine: Negative.  Negative for polydipsia, polyphagia and polyuria.  Genitourinary:  Positive for frequency (worse over the last 6 months). Negative for dysuria.  Musculoskeletal: Negative.   Skin: Negative.   Allergic/Immunologic: Negative.   Neurological: Negative.  Negative for headaches.  Hematological: Negative.   Psychiatric/Behavioral: Negative.      Today's Vitals   10/29/21 1419  BP: 136/80  Pulse: 81  Temp: 98.6 F (37 C)  TempSrc: Oral  Weight: 182 lb (82.6 kg)  Height: _0  (1.753 m)   Body mass index is 26.88 kg/m.  Wt Readings from Last 3 Encounters:  10/29/21 182 lb (82.6 kg)  04/09/21 170 lb 9.6 oz (77.4 kg)  11/20/20 165 lb (74.8 kg)    Objective:  Physical Exam Constitutional:      General: She is not in acute distress.    Appearance: Normal appearance. She is well-developed. She is obese.  HENT:     Head: Normocephalic and atraumatic.     Right Ear: Hearing and external ear normal. There is impacted cerumen (excess firm cerumen).     Left Ear: Hearing, tympanic membrane, ear canal and external ear normal. There is no impacted cerumen.     Nose:     Comments: Deferred - masked    Mouth/Throat:     Comments: Deferred - masked Eyes:     General: Lids are normal.     Extraocular Movements: Extraocular movements intact.     Conjunctiva/sclera: Conjunctivae normal.     Pupils: Pupils are equal, round, and reactive to light.     Funduscopic exam:    Right eye: No papilledema.        Left eye: No papilledema.  Neck:     Thyroid: No thyroid mass.     Vascular: No carotid bruit.  Cardiovascular:     Rate and Rhythm: Normal rate and regular rhythm.     Pulses: Normal pulses.     Heart sounds: Normal heart sounds. No murmur heard. Pulmonary:     Effort: Pulmonary effort is normal. No respiratory distress.     Breath sounds: Normal breath sounds. No wheezing.  Chest:     Chest wall: No  mass.  Breasts:    Tanner Score is 5.     Right: Normal. No mass or tenderness.     Left: Normal. No mass or tenderness.  Abdominal:     General: Abdomen is flat. Bowel sounds are normal. There is no distension.     Palpations: Abdomen is soft.     Tenderness: There is no abdominal tenderness.  Genitourinary:    Rectum: Guaiac result negative.  Musculoskeletal:        General: No swelling or tenderness. Normal range of motion.     Cervical back: Full passive range of motion without pain, normal range of motion and neck supple.     Right lower  leg: No edema.     Left lower leg: No edema.  Lymphadenopathy:     Upper Body:     Right upper body: No supraclavicular, axillary or pectoral adenopathy.     Left upper body: No supraclavicular, axillary or pectoral adenopathy.  Skin:    General: Skin is warm and dry.     Capillary Refill: Capillary refill takes less than 2 seconds.  Neurological:     General: No focal deficit present.     Mental Status: She is alert and oriented to person, place, and time.     Cranial Nerves: No cranial nerve deficit.     Sensory: No sensory deficit.     Motor: No weakness.     Comments: She has mild uncontrolled tremors  Psychiatric:        Mood and Affect: Mood normal.        Behavior: Behavior normal.        Thought Content: Thought content normal.        Judgment: Judgment normal.        Assessment And Plan:     1. Encounter for annual physical exam Behavior modifications discussed and diet history reviewed.   Pt will continue to exercise regularly and modify diet with low GI, plant based foods and decrease intake of processed foods.  Recommend intake of daily multivitamin, Vitamin D, and calcium.  Recommend mammogram and colonoscopy for preventive screenings, as well as recommend immunizations that include influenza, TDAP, and Shingles  2. Encounter for screening colonoscopy According to USPTF Colorectal cancer Screening guidelines.  Colonoscopy is recommended every 10 years, starting at age 32 years. Will refer to GI for colon cancer screening. - Ambulatory referral to Gastroenterology  3. Encounter for hepatitis C screening test for low risk patient Will check Hepatitis C screening due to recent recommendations to screen all adults 18 years and older - Hepatitis C antibody  4. Essential hypertension Comments: Blood pressure is fairly controlled, continue current medications - POCT Urinalysis Dipstick (81002) - EKG 12-Lead - Microalbumin / Creatinine Urine Ratio - CMP14+EGFR  5. Mixed hyperlipidemia Comments: Stable, tolerating statin well.  - CMP14+EGFR - Lipid panel  6. Severe major depression without psychotic features ([place]) Comments: Continue follow up with Behavioral Health  7. Increased urinary frequency Comments: Will check labs and urinalysis prior to starting medications for incontinence - CBC - Hemoglobin A1c - CMP14+EGFR  8. Other urinary incontinence Comments: Pending labs will consider starting her on medications for incontinence or refer to rehab for pelvic rehab - CBC  9. Excessive cerumen in right ear canal Wax is removed by with lavage with elephant ear with 1/2 water and 1/2 peroxide. Instructions for home care to prevent wax buildup are given. - Ear Lavage  10. Leukocytes in urine Trace leukocytes, will send for culture - Culture, Urine     Patient was given opportunity to ask questions. Patient verbalized understanding of the plan and was able to repeat key elements of the plan. All questions were answered to their satisfaction.   [name], FNP   I, [name], FNP, have reviewed all documentation for this visit. The documentation on 10/29/21 for the exam, diagnosis, procedures, and orders are all accurate and complete.   THE PATIENT IS ENCOURAGED TO PRACTICE SOCIAL DISTANCING DUE TO THE COVID-19 PANDEMIC.

## 2021-10-30 LAB — CMP14+EGFR
ALT: 36 IU/L — ABNORMAL HIGH (ref 0–32)
AST: 30 IU/L (ref 0–40)
Albumin/Globulin Ratio: 1.7 (ref 1.2–2.2)
Albumin: 4.5 g/dL (ref 3.7–4.7)
Alkaline Phosphatase: 85 IU/L (ref 44–121)
BUN/Creatinine Ratio: 12 (ref 12–28)
BUN: 12 mg/dL (ref 8–27)
Bilirubin Total: 0.3 mg/dL (ref 0.0–1.2)
CO2: 26 mmol/L (ref 20–29)
Calcium: 9.9 mg/dL (ref 8.7–10.3)
Chloride: 107 mmol/L — ABNORMAL HIGH (ref 96–106)
Creatinine, Ser: 1.04 mg/dL — ABNORMAL HIGH (ref 0.57–1.00)
Globulin, Total: 2.6 g/dL (ref 1.5–4.5)
Glucose: 81 mg/dL (ref 70–99)
Potassium: 4.3 mmol/L (ref 3.5–5.2)
Sodium: 147 mmol/L — ABNORMAL HIGH (ref 134–144)
Total Protein: 7.1 g/dL (ref 6.0–8.5)
eGFR: 56 mL/min/{1.73_m2} — ABNORMAL LOW (ref >59)

## 2021-10-30 LAB — LIPID PANEL
Chol/HDL Ratio: 2.3 ratio (ref 0.0–4.4)
Cholesterol, Total: 170 mg/dL (ref 100–199)
HDL: 75 mg/dL (ref >39)
LDL Chol Calc (NIH): 73 mg/dL (ref 0–99)
Triglycerides: 129 mg/dL (ref 0–149)
VLDL Cholesterol Cal: 22 mg/dL (ref 5–40)

## 2021-10-30 LAB — CBC
Hematocrit: 41.8 % (ref 34.0–46.6)
Hemoglobin: 14.3 g/dL (ref 11.1–15.9)
MCH: 30.6 pg (ref 26.6–33.0)
MCHC: 34.2 g/dL (ref 31.5–35.7)
MCV: 90 fL (ref 79–97)
Platelets: 241 10*3/uL (ref 150–450)
RBC: 4.67 x10E6/uL (ref 3.77–5.28)
RDW: 13.3 % (ref 11.7–15.4)
WBC: 5.4 10*3/uL (ref 3.4–10.8)

## 2021-10-30 LAB — HEPATITIS C ANTIBODY: Hep C Virus Ab: <0.1 s/co ratio (ref 0.0–0.9)

## 2021-10-30 LAB — MICROALBUMIN / CREATININE URINE RATIO
Creatinine, Urine: 229.1 mg/dL
Microalb/Creat Ratio: 5 mg/g creat (ref 0–29)
Microalbumin, Urine: 11.1 ug/mL

## 2021-10-30 LAB — HEMOGLOBIN A1C
Est. average glucose Bld gHb Est-mCnc: 105 mg/dL
Hgb A1c MFr Bld: 5.3 % (ref 4.8–5.6)

## 2021-11-01 LAB — URINE CULTURE

## 2021-11-02 ENCOUNTER — Telehealth: Payer: Medicare HMO

## 2021-11-02 ENCOUNTER — Ambulatory Visit (INDEPENDENT_AMBULATORY_CARE_PROVIDER_SITE_OTHER): Payer: Medicare HMO

## 2021-11-02 DIAGNOSIS — G2401 Drug induced subacute dyskinesia: Secondary | ICD-10-CM

## 2021-11-02 DIAGNOSIS — E782 Mixed hyperlipidemia: Secondary | ICD-10-CM

## 2021-11-02 DIAGNOSIS — E876 Hypokalemia: Secondary | ICD-10-CM

## 2021-11-02 DIAGNOSIS — F3175 Bipolar disorder, in partial remission, most recent episode depressed: Secondary | ICD-10-CM

## 2021-11-02 DIAGNOSIS — I10 Essential (primary) hypertension: Secondary | ICD-10-CM

## 2021-11-02 NOTE — Progress Notes (Signed)
This encounter was created in error - please disregard.

## 2021-11-02 NOTE — Chronic Care Management (AMB) (Signed)
Chronic Care Management   CCM RN Visit Note  11/02/2021 Name: Judy [name] MRN: [id number] DOB: 1946-06-24  Subjective: Judy [name] is a 76 y.o. year old female who is a primary care patient of [name], [place]. The care management team was consulted for assistance with disease management and care coordination needs.    Engaged with patient by telephone for follow up visit in response to provider referral for case management and/or care coordination services.   Consent to Services:  The patient was given information about Chronic Care Management services, agreed to services, and gave verbal consent prior to initiation of services.  Please see initial visit note for detailed documentation.   Patient agreed to services and verbal consent obtained.   Assessment: Review of patient past medical history, allergies, medications, health status, including review of consultants reports, laboratory and other test data, was performed as part of comprehensive evaluation and provision of chronic care management services.   SDOH (Social Determinants of Health) assessments and interventions performed:  Yes, no acute changes   CCM Care Plan  No Known Allergies  Outpatient Encounter Medications as of 11/02/2021  Medication Sig   atorvastatin (LIPITOR) 10 MG tablet Take 1 tablet (10 mg total) by mouth daily.   buPROPion (WELLBUTRIN XL) 150 MG 24 hr tablet Take 1 tablet (150 mg total) by mouth daily.   gabapentin (NEURONTIN) 100 MG capsule Take 1 capsule (100 mg total) by mouth 3 (three) times daily. Take with 400 mg pills= 500 mg tid.   gabapentin (NEURONTIN) 400 MG capsule Take 1 capsule (400 mg total) by mouth 3 (three) times daily.   gabapentin (NEURONTIN) 600 MG tablet Take 1 tablet (600 mg total) by mouth 3 (three) times daily.   valbenazine (INGREZZA) 80 MG capsule Take 1 capsule (80 mg total) by mouth daily.   No facility-administered encounter medications on file as of 11/02/2021.     Patient Active Problem List   Diagnosis Date Noted   Pulmonary embolism ([place]) 11/15/2019   Spinal stenosis 11/07/2019   Weakness of both lower extremities 11/06/2019   Generalized anxiety disorder 07/24/2018   Solitary pulmonary nodule on lung CT 06/13/2017   Spinal stenosis at L4-L5 level    Dysphagia 05/25/2017   Tardive dyskinesia 05/25/2017   Dyspnea on exertion 05/25/2017   Fall 05/25/2017   Bilateral leg pain    Chronic pain of right knee 03/25/2017   High risk medication use 03/25/2017   Family hx of colon cancer 03/25/2017   Occupational exposure in workplace 03/25/2017   Depressed bipolar I disorder in partial remission ([place]) 03/25/2017   Routine general medical examination at a health care facility 06/04/2014   Hyperlipidemia with target LDL less than 130 03/20/2014   Essential hypertension, benign 03/20/2014   Unspecified constipation 03/20/2014   Light headedness 03/20/2014   Acute on chronic kidney disease, stage 3 03/20/2014   Hypokalemia 03/20/2014   Encounter for therapeutic drug monitoring 03/20/2014   Other dysphagia 12/26/2013   GERD (gastroesophageal reflux disease) 12/21/2013   Constipation 12/21/2013   Syncope 12/10/2013   Leukocytosis 12/10/2013   URI (upper respiratory infection) 12/10/2013   Bipolar 1 disorder (HCC)    Obesity (BMI 30-39.9)    Hearing loss in left ear    Vertigo    HYPERCHOLESTEROLEMIA 10/08/2010   DEPRESSION 10/08/2010   HYPERTENSION 10/08/2010   ALLERGIC RHINITIS 10/08/2010    Conditions to be addressed/monitored: HTN, Mixed Hyperlipidemia, Hypokalemia, Tardive dyskinesia, Depressed bipolar 1 disorder, partial remission  Care Plan : RN Care Manager Plan of Care  Updates made by Lynne Logan, RN since 11/02/2021 12:00 AM     Problem: No plan of care established for management of chronic disease states (HTN, Mixed Hyperlipidemia, Hypokalemia, Tardive dyskinesia, Depressed bipolar 1 disorder, partial remission)    Priority: High     Long-Range Goal: Development of plan of care for chronic disease management for HTN, Mixed Hyperlipidemia, Hypokalemia, Tardive dyskinesia, Depressed bipolar 1 disorder, partial remission   Start Date: 08/17/2021  Expected End Date: 08/17/2022  Recent Progress: On track  Priority: High  Note:   Current Barriers:  Knowledge Deficits related to plan of care for management of HTN, Mixed Hyperlipidemia, Hypokalemia, Tardive dyskinesia, Depressed bipolar 1 disorder, partial remission  Chronic Disease Management support and education needs related to HTN, Mixed Hyperlipidemia, Hypokalemia, Tardive dyskinesia, Depressed bipolar 1 disorder, partial remission  Financial Constraints  RNCM Clinical Goal(s):  Patient will verbalize basic understanding of  HTN, Mixed Hyperlipidemia, Hypokalemia, Tardive dyskinesia, Depressed bipolar 1 disorder, partial remission  disease process and self health management plan   demonstrate Improved health management independence   continue to work with RN Care Manager to address care management and care coordination needs related to  HTN, Mixed Hyperlipidemia, Hypokalemia, Tardive dyskinesia, Depressed bipolar 1 disorder, partial remission  will demonstrate ongoing self health care management ability    through collaboration with RN Care manager, provider, and care team.   Interventions: 1:1 collaboration with primary care provider regarding development and update of comprehensive plan of care as evidenced by provider attestation and co-signature Inter-disciplinary care team collaboration (see longitudinal plan of care) Evaluation of current treatment plan related to  self management and patient's adherence to plan as established by provider  Hypertension Interventions:  (Status:  Goal on track:  Yes.) Long Term Goal Last practice recorded BP readings:  BP Readings from Last 3 Encounters:  10/29/21 136/80  04/09/21 (!) 142/90  11/20/19 (!) 119/57   Most recent eGFR/CrCl:  Lab Results  Component Value Date   EGFR 56 (L) 10/29/2021    No components found for: CRCL Evaluation of current treatment plan related to hypertension self management and patient's adherence to plan as established by provider Reviewed medications with patient and discussed importance of compliance Counseled on the importance of exercise goals with target of 150 minutes per week Advised patient, providing education and rationale, to monitor blood pressure daily and record, calling PCP for findings outside established parameters Provided education on prescribed diet low Sodium Mailed printed educational materials related to Why Should I Lower Sodium? Discussed plans with patient for ongoing care management follow up and provided patient with direct contact information for care management team  Hyperlipidemia Interventions:  (Status:  Goal on track:  Yes.) Long Term Goal Review of patient status, including review of consultant's reports, relevant laboratory and other test results, and medications completed. Reviewed medications with patient and discussed importance of medication adherence Counseled on importance of regular laboratory monitoring as prescribed Reviewed importance of limiting foods high in cholesterol Reviewed exercise goals and target of 150 minutes per week Screening for signs and symptoms of depression related to chronic disease state  Mailed printed educational materials related to Chair Exercises Mailed printed educational materials related to [employer]  Discussed plans with patient for ongoing care management follow up and provided patient with direct contact information for care management team Lipid Panel     Component Value Date/Time   CHOL 170 10/29/2021  1531   TRIG 129 10/29/2021 1531   HDL 75 10/29/2021 1531   CHOLHDL 2.3 10/29/2021 1531   CHOLHDL 3.4 03/25/2017 1550   VLDL 22 03/25/2017 1550   LDLCALC 73 10/29/2021 1531    LABVLDL 22 10/29/2021 1531  Urinary frequency Interventions:  (Status:  Condition stable.  Not addressed this visit.)  Short Term Goal Evaluation of current treatment plan related to  Urinary frequency , self-management and patient's adherence to plan as established by provider Discussed patient is experiencing urinary incontinence and urinary frequency, she wears depends Assessed for signs/symptoms of urinary tract infection, patient denies having other symptoms Encouraged patient to discuss her symptoms with PCP, she will wait until her next scheduled visit  Reviewed scheduled/upcoming provider appointments including: next PCP follow up appointment scheduled for 10/29/21 _0 :00 PM  Educated patient on the importance of staying well hydrated and avoiding caffeine that may increase urinary frequency Reviewed signs and symptoms suggestive of UTI and when to seek medical attention if needed; Educated on potential complications if a UTI occurs and is left untreated Discussed plans with patient for ongoing care management follow up and provided patient with direct contact information for care management team  Patient Goals/Self-Care Activities: Take all medications as prescribed Attend all scheduled provider appointments Call pharmacy for medication refills 3-7 days in advance of running out of medications Call provider office for new concerns or questions  keep all doctor appointments take medications for blood pressure exactly as prescribed report new symptoms to your doctor adhere to prescribed diet: low trans/Saturated fat develop an exercise routine Adhere to low Sodium diet   Follow Up Plan:  Telephone follow up appointment with care management team member scheduled for:  12/31/21     Plan:Telephone follow up appointment with care management team member scheduled for:  12/31/21  Barb Merino, RN, BSN, CCM Care Management Coordinator [facility]   Direct Phone: [phone]

## 2021-11-02 NOTE — Patient Instructions (Signed)
Visit Information  Thank you for taking time to visit with me today. Please don't hesitate to contact me if I can be of assistance to you before our next scheduled telephone appointment.  Following are the goals we discussed today:  (Copy and paste patient goals from clinical care plan here)  Our next appointment is by telephone on 12/31/21 at 10:30 AM  Please call the care guide team at [phone] if you need to cancel or reschedule your appointment.   If you are experiencing a Mental Health or [place] or need someone to talk to, please call 1-800-273-TALK (toll free, 24 hour hotline)   The patient verbalized understanding of instructions, educational materials, and care plan provided today and agreed to receive a mailed copy of patient instructions, educational materials, and care plan.   Barb Merino, RN, BSN, CCM Care Management Coordinator [place] [facility]  Direct Phone: [phone]

## 2021-11-08 ENCOUNTER — Other Ambulatory Visit: Payer: Self-pay | Admitting: Nurse Practitioner

## 2021-11-08 DIAGNOSIS — N390 Urinary tract infection, site not specified: Secondary | ICD-10-CM

## 2021-11-08 MED ORDER — AMOXICILLIN-POT CLAVULANATE 875-125 MG PO TABS: 1.0000 | ORAL_TABLET | Freq: Two times a day (BID) | ORAL | 0 refills | Status: AC

## 2021-11-10 DIAGNOSIS — F3175 Bipolar disorder, in partial remission, most recent episode depressed: Secondary | ICD-10-CM

## 2021-11-10 DIAGNOSIS — E782 Mixed hyperlipidemia: Secondary | ICD-10-CM

## 2021-11-10 DIAGNOSIS — I10 Essential (primary) hypertension: Secondary | ICD-10-CM

## 2021-11-10 DIAGNOSIS — R69 Illness, unspecified: Secondary | ICD-10-CM | POA: Diagnosis not present

## 2021-11-25 ENCOUNTER — Ambulatory Visit: Payer: Medicare HMO

## 2021-11-25 ENCOUNTER — Telehealth: Payer: Self-pay

## 2021-11-25 NOTE — Telephone Encounter (Signed)
This nurse called patient in regards to missed AWV. Rescheduled to 12/03/2021.

## 2021-12-02 ENCOUNTER — Other Ambulatory Visit: Payer: Self-pay

## 2021-12-02 MED ORDER — GABAPENTIN 400 MG PO CAPS: 400.0000 mg | ORAL_CAPSULE | Freq: Three times a day (TID) | ORAL | 0 refills | Status: DC

## 2021-12-03 ENCOUNTER — Ambulatory Visit (INDEPENDENT_AMBULATORY_CARE_PROVIDER_SITE_OTHER): Payer: Medicare HMO

## 2021-12-03 VITALS — Ht 69.0 in | Wt 175.0 lb

## 2021-12-03 DIAGNOSIS — Z0000 Encounter for general adult medical examination without abnormal findings: Secondary | ICD-10-CM | POA: Diagnosis not present

## 2021-12-03 NOTE — Progress Notes (Signed)
I connected with Monike Bragdon today by telephone and verified that I am speaking with the correct person using two identifiers. Location patient: home Location provider: work Persons participating in the virtual visit: Amrutha Avera, Glenna Durand LPN.   I discussed the limitations, risks, security and privacy concerns of performing an evaluation and management service by telephone and the availability of in person appointments. I also discussed with the patient that there may be a patient responsible charge related to this service. The patient expressed understanding and verbally consented to this telephonic visit.    Interactive audio and video telecommunications were attempted between this provider and patient, however failed, due to patient having technical difficulties OR patient did not have access to video capability.  We continued and completed visit with audio only.     Vital signs may be patient reported or missing.  Subjective:   Judy [name] is a 76 y.o. female who presents for Medicare Annual (Subsequent) preventive examination.  Review of Systems     Cardiac Risk Factors include: advanced age (>66men, >75 women);dyslipidemia;hypertension     Objective:    Today's Vitals   12/03/21 1555 12/03/21 1556  Weight: 175 lb (79.4 kg)   Height: 5\' 9"  (1.753 m)   PainSc:  6    Body mass index is 25.84 kg/m.  Advanced Directives 12/03/2021 11/20/2020 11/15/2019 11/06/2019 09/18/2019 07/31/2019 06/14/2019  Does Patient Have a Medical Advance Directive? Yes Yes No No Yes No No  Type of Paramedic of [place];Living will [place];Out of facility DNR (pink MOST or yellow form) - - [place] - -  Does patient want to make changes to medical advance directive? - - - - - - -  Copy of [place] in Chart? Yes - validated most recent copy scanned in chart (See row information) Yes - validated most recent copy  scanned in chart (See row information) - - Yes - validated most recent copy scanned in chart (See row information) - -  Would patient like information on creating a medical advance directive? - - No - Patient declined Yes (ED - Information included in AVS) - Yes (MAU/Ambulatory/Procedural Areas - Information given) No - Patient declined  Pre-existing out of facility DNR order (yellow form or pink MOST form) - - - - - - -    Current Medications (verified) Outpatient Encounter Medications as of 12/03/2021  Medication Sig   atorvastatin (LIPITOR) 10 MG tablet Take 1 tablet (10 mg total) by mouth daily.   buPROPion (WELLBUTRIN XL) 150 MG 24 hr tablet Take 1 tablet (150 mg total) by mouth daily.   gabapentin (NEURONTIN) 100 MG capsule Take 1 capsule (100 mg total) by mouth 3 (three) times daily. Take with 400 mg pills= 500 mg tid.   gabapentin (NEURONTIN) 400 MG capsule Take 1 capsule (400 mg total) by mouth 3 (three) times daily.   valbenazine (INGREZZA) 80 MG capsule Take 1 capsule (80 mg total) by mouth daily.   gabapentin (NEURONTIN) 600 MG tablet Take 1 tablet (600 mg total) by mouth 3 (three) times daily. (Patient not taking: Reported on 12/03/2021)   No facility-administered encounter medications on file as of 12/03/2021.    Allergies (verified) Patient has no known allergies.   History: Past Medical History:  Diagnosis Date   Allergic rhinitis    Anemia    Anxiety    Arthritis    Bipolar 1 disorder ([place])    Depression  Diverticulosis of colon (without mention of hemorrhage) 08/25/2011   [name]   Hearing loss in left ear    since birth   Hyperlipidemia    Hypertension    Obesity    Renal disorder    Vertigo    Past Surgical History:  Procedure Laterality Date   COLONOSCOPY  08/25/2011   Procedure: COLONOSCOPY;  Surgeon: Landry Dyke, MD;  Location: [facility];  Service: Endoscopy;  Laterality: N/A;   DILATION AND CURETTAGE OF UTERUS  57's   Family History   Problem Relation Age of Onset   Prostate cancer Brother    Hypertension Brother    Kidney disease Brother    Colon cancer Brother    Hypertension Mother    Kidney disease Mother    Stomach cancer Father    Hyperlipidemia Sister    Hypothyroidism Sister    Stroke Brother    Prostate cancer Brother    Social History   Socioeconomic History   Marital status: Single    Spouse name: Not on file   Number of children: 1   Years of education: 12   Highest education level: Not on file  Occupational History   Occupation: Retired  Tobacco Use   Smoking status: Never   Smokeless tobacco: Never  Vaping Use   Vaping Use: Never used  Substance and Sexual Activity   Alcohol use: No   Drug use: No   Sexual activity: Not Currently  Other Topics Concern   Not on file  Social History Narrative   Lives alone in house, does not need assist device.  Works part time at [employer]. Has a son.     Cousin and sister in close proximity   Right handed   2 cups coffee per day   Social Determinants of Health   Financial Resource Strain: Low Risk    Difficulty of Paying Living Expenses: Not hard at all  Food Insecurity: No Food Insecurity   Worried About Charity fundraiser in the Last Year: Never true   Arboriculturist in the Last Year: Never true  Transportation Needs: No Transportation Needs   Lack of Transportation (Medical): No   Lack of Transportation (Non-Medical): No  Physical Activity: Insufficiently Active   Days of Exercise per Week: 3 days   Minutes of Exercise per Session: 30 min  Stress: No Stress Concern Present   Feeling of Stress : Only a little  Social Connections: Not on file    Tobacco Counseling Counseling given: Not Answered   Clinical Intake:  Pre-visit preparation completed: Yes  Pain : 0-10 Pain Score: 6  Pain Location: Leg Pain Orientation: Right, Left Pain Descriptors / Indicators: Aching Pain Frequency: Intermittent Pain Relieving  Factors: APAP helps  Pain Relieving Factors: APAP helps  Nutritional Status: BMI 25 -29 Overweight Nutritional Risks: None Diabetes: No  How often do you need to have someone help you when you read instructions, pamphlets, or other written materials from your doctor or pharmacy?: 1 - Never What is the last grade level you completed in school?: 12th grade  Diabetic? no  Interpreter Needed?: No  Information entered by :: NAllen LPN   Activities of Daily Living In your present state of health, do you have any difficulty performing the following activities: 12/03/2021  Hearing? Y  Comment decreased hearing  Vision? N  Difficulty concentrating or making decisions? N  Walking or climbing stairs? Y  Dressing or bathing? N  Doing  errands, shopping? N  Preparing Food and eating ? N  Using the Toilet? N  In the past six months, have you accidently leaked urine? Y  Do you have problems with loss of bowel control? N  Managing your Medications? N  Managing your Finances? N  Housekeeping or managing your Housekeeping? N  Some recent data might be hidden    Patient Care Team: [name], FNP as PCP - General (General Practice) Kristeen Miss, MD as Consulting Physician (Neurosurgery) Cottle, Billey Co., MD as Attending Physician (Psychiatry) Rex Kras, Claudette Stapler, RN as Case Manager  Indicate any recent Medical Services you may have received from other than [facility] providers in the past year (date may be approximate).     Assessment:   This is a routine wellness examination for [place].  Hearing/Vision screen Vision Screening - Comments:: No regular eye exams, [facility]  Dietary issues and exercise activities discussed: Current Exercise Habits: Home exercise routine, Type of exercise: walking, Time (Minutes): 30, Frequency (Times/Week): 3, Weekly Exercise (Minutes/Week): 90   Goals Addressed             This Visit's Progress    Patient Stated       12/03/2021, no goals        Depression Screen PHQ 2/9 Scores 12/03/2021 04/09/2021 11/20/2020 09/18/2019 07/02/2019 02/26/2019 07/24/2018  PHQ - 2 Score 0 2 0 0 6 0 1  PHQ- 9 Score - 3 - 2 23 - 2    Fall Risk Fall Risk  12/03/2021 11/20/2020 09/18/2019 07/02/2019 02/26/2019  Falls in the past year? 0 0 0 1 0  Number falls in past yr: - - - 1 -  Injury with Fall? - - - 0 -  Comment - - - - -  Risk for fall due to : Impaired balance/gait;Impaired mobility;Medication side effect Impaired mobility;Medication side effect;Impaired balance/gait Medication side effect - -  Follow up Falls evaluation completed;Education provided;Falls prevention discussed Falls evaluation completed;Education provided;Falls prevention discussed Falls evaluation completed;Education provided;Falls prevention discussed - -    FALL RISK PREVENTION PERTAINING TO THE HOME:  Any stairs in or around the home? Yes  If so, are there any without handrails?  N/a Home free of loose throw rugs in walkways, pet beds, electrical cords, etc? Yes  Adequate lighting in your home to reduce risk of falls? Yes   ASSISTIVE DEVICES UTILIZED TO PREVENT FALLS:  Life alert? No  Use of a cane, walker or w/c? Yes  Grab bars in the bathroom? Yes  Shower chair or bench in shower? Yes  Elevated toilet seat or a handicapped toilet? No   TIMED UP AND GO:  Was the test performed? No .      Cognitive Function: MMSE - Mini Mental State Exam 07/06/2017 03/25/2017 06/04/2014  Orientation to time 5 5 5   Orientation to Place 5 5 5   Registration 3 3 3   Attention/ Calculation 4 4 5   Recall 1 2 2   Language- name 2 objects 2 2 2   Language- repeat 1 1 1   Language- follow 3 step command 3 3 3   Language- read & follow direction 1 1 1   Write a sentence 1 1 1   Copy design 1 0 0  Total score 27 27 28      6CIT Screen 12/03/2021 11/20/2020 09/18/2019  What Year? 0 points 0 points 0 points  What month? 0 points 0 points 0 points  What time? 0 points 0 points 0 points  Count  back  from 20 0 points 0 points 0 points  Months in reverse 4 points 4 points 0 points  Repeat phrase 10 points 4 points 0 points  Total Score 14 8 0    Immunizations Immunization History  Administered Date(s) Administered   Influenza, High Dose Seasonal PF 07/24/2018, 07/02/2019   Influenza,inj,Quad PF,6+ Mos 12/11/2013, 09/12/2015, 07/20/2016, 08/10/2017   PFIZER(Purple Top)SARS-COV-2 Vaccination 12/24/2019, 01/15/2020, 07/14/2020, 10/09/2020   Pneumococcal Polysaccharide-23 12/11/2013, 11/08/2019    TDAP status: Due, Education has been provided regarding the importance of this vaccine. Advised may receive this vaccine at local pharmacy or Health Dept. Aware to provide a copy of the vaccination record if obtained from local pharmacy or Health Dept. Verbalized acceptance and understanding.  Flu Vaccine status: Up to date  Pneumococcal vaccine status: Due, Education has been provided regarding the importance of this vaccine. Advised may receive this vaccine at local pharmacy or Health Dept. Aware to provide a copy of the vaccination record if obtained from local pharmacy or Health Dept. Verbalized acceptance and understanding.  Covid-19 vaccine status: Completed vaccines  Qualifies for Shingles Vaccine? Yes   Zostavax completed No   Shingrix Completed?: No.    Education has been provided regarding the importance of this vaccine. Patient has been advised to call insurance company to determine out of pocket expense if they have not yet received this vaccine. Advised may also receive vaccine at local pharmacy or Health Dept. Verbalized acceptance and understanding.  Screening Tests Health Maintenance  Topic Date Due   MAMMOGRAM  11/09/2019   Pneumonia Vaccine 57+ Years old (3 - PCV) 11/07/2020   COVID-19 Vaccine (5 - Booster for Pfizer series) 12/04/2020   INFLUENZA VACCINE  05/11/2021   COLONOSCOPY (Pts 45-43yrs Insurance coverage will need to be confirmed)  08/24/2021   Zoster  Vaccines- Shingrix (1 of 2) 01/27/2022 (Originally 07/09/1965)   TETANUS/TDAP  10/29/2022 (Originally 07/09/1965)   DEXA SCAN  Completed   Hepatitis C Screening  Completed   HPV VACCINES  Aged Out    Health Maintenance  Health Maintenance Due  Topic Date Due   MAMMOGRAM  11/09/2019   Pneumonia Vaccine 73+ Years old (3 - PCV) 11/07/2020   COVID-19 Vaccine (5 - Booster for [place] series) 12/04/2020   INFLUENZA VACCINE  05/11/2021   COLONOSCOPY (Pts 45-63yrs Insurance coverage will need to be confirmed)  08/24/2021    Colorectal cancer screening: decline   Mammogram status: decline  Bone Density status: Completed 04/06/2013.   Lung Cancer Screening: (Low Dose CT Chest recommended if Age 50-80 years, 30 pack-year currently smoking OR have quit w/in 15years.) does not qualify.   Lung Cancer Screening Referral: no  Additional Screening:  Hepatitis C Screening: does qualify; Completed 10/29/2021  Vision Screening: Recommended annual ophthalmology exams for early detection of glaucoma and other disorders of the eye. Is the patient up to date with their annual eye exam?  No  Who is the provider or what is the name of the office in which the patient attends annual eye exams? [facility] If pt is not established with a provider, would they like to be referred to a provider to establish care? No .   Dental Screening: Recommended annual dental exams for proper oral hygiene  Community Resource Referral / Chronic Care Management: CRR required this visit?  No   CCM required this visit?  No      Plan:     I have personally reviewed and noted the following in the patients chart:  Medical and social history Use of alcohol, tobacco or illicit drugs  Current medications and supplements including opioid prescriptions.  Functional ability and status Nutritional status Physical activity Advanced directives List of other physicians Hospitalizations, surgeries, and ER visits in  previous 12 months Vitals Screenings to include cognitive, depression, and falls Referrals and appointments  In addition, I have reviewed and discussed with patient certain preventive protocols, quality metrics, and best practice recommendations. A written personalized care plan for preventive services as well as general preventive health recommendations were provided to patient.     Kellie Simmering, LPN   04/20/6578   Nurse Notes: none  Due to this being a virtual visit, the after visit summary with patients personalized plan was offered to patient via mail or my-chart. Patient declined at this time.

## 2021-12-03 NOTE — Patient Instructions (Signed)
Judy [name] , Thank you for taking time to come for your Medicare Wellness Visit. I appreciate your ongoing commitment to your health goals. Please review the following plan we discussed and let me know if I can assist you in the future.   Screening recommendations/referrals: Colonoscopy: declined Mammogram: declined Bone Density: completed 04/06/2013 Recommended yearly ophthalmology/optometry visit for glaucoma screening and checkup Recommended yearly dental visit for hygiene and checkup  Vaccinations: Influenza vaccine: completed 08/04/2021, due next flu season Pneumococcal vaccine: due Tdap vaccine: decline Shingles vaccine: decline   Covid-19: 10/09/2020, 07/14/2020, 01/15/2020, 12/24/2019  Advanced directives: copy in chart  Conditions/risks identified: none  Next appointment: Follow up in one year for your annual wellness visit    Preventive Care 65 Years and Older, Female Preventive care refers to lifestyle choices and visits with your health care provider that can promote health and wellness. What does preventive care include? A yearly physical exam. This is also called an annual well check. Dental exams once or twice a year. Routine eye exams. Ask your health care provider how often you should have your eyes checked. Personal lifestyle choices, including: Daily care of your teeth and gums. Regular physical activity. Eating a healthy diet. Avoiding tobacco and drug use. Limiting alcohol use. Practicing safe sex. Taking low-dose aspirin every day. Taking vitamin and mineral supplements as recommended by your health care provider. What happens during an annual well check? The services and screenings done by your health care provider during your annual well check will depend on your age, overall health, lifestyle risk factors, and family history of disease. Counseling  Your health care provider may ask you questions about your: Alcohol use. Tobacco use. Drug  use. Emotional well-being. Home and relationship well-being. Sexual activity. Eating habits. History of falls. Memory and ability to understand (cognition). Work and work Statistician. Reproductive health. Screening  You may have the following tests or measurements: Height, weight, and BMI. Blood pressure. Lipid and cholesterol levels. These may be checked every 5 years, or more frequently if you are over 2 years old. Skin check. Lung cancer screening. You may have this screening every year starting at age 86 if you have a 30-pack-year history of smoking and currently smoke or have quit within the past 15 years. Fecal occult blood test (FOBT) of the stool. You may have this test every year starting at age 57. Flexible sigmoidoscopy or colonoscopy. You may have a sigmoidoscopy every 5 years or a colonoscopy every 10 years starting at age 47. Hepatitis C blood test. Hepatitis B blood test. Sexually transmitted disease (STD) testing. Diabetes screening. This is done by checking your blood sugar (glucose) after you have not eaten for a while (fasting). You may have this done every 1-3 years. Bone density scan. This is done to screen for osteoporosis. You may have this done starting at age 88. Mammogram. This may be done every 1-2 years. Talk to your health care provider about how often you should have regular mammograms. Talk with your health care provider about your test results, treatment options, and if necessary, the need for more tests. Vaccines  Your health care provider may recommend certain vaccines, such as: Influenza vaccine. This is recommended every year. Tetanus, diphtheria, and acellular pertussis (Tdap, Td) vaccine. You may need a Td booster every 10 years. Zoster vaccine. You may need this after age 90. Pneumococcal 13-valent conjugate (PCV13) vaccine. One dose is recommended after age 51. Pneumococcal polysaccharide (PPSV23) vaccine. One dose is recommended after age  28. Talk to your health care provider about which screenings and vaccines you need and how often you need them. This information is not intended to replace advice given to you by your health care provider. Make sure you discuss any questions you have with your health care provider. Document Released: 10/24/2015 Document Revised: 06/16/2016 Document Reviewed: 07/29/2015 Elsevier Interactive Patient Education  2017 [place] Prevention in the Home Falls can cause injuries. They can happen to people of all ages. There are many things you can do to make your home safe and to help prevent falls. What can I do on the outside of my home? Regularly fix the edges of walkways and driveways and fix any cracks. Remove anything that might make you trip as you walk through a door, such as a raised step or threshold. Trim any bushes or trees on the path to your home. Use bright outdoor lighting. Clear any walking paths of anything that might make someone trip, such as rocks or tools. Regularly check to see if handrails are loose or broken. Make sure that both sides of any steps have handrails. Any raised decks and porches should have guardrails on the edges. Have any leaves, snow, or ice cleared regularly. Use sand or salt on walking paths during winter. Clean up any spills in your garage right away. This includes oil or grease spills. What can I do in the bathroom? Use night lights. Install grab bars by the toilet and in the tub and shower. Do not use towel bars as grab bars. Use non-skid mats or decals in the tub or shower. If you need to sit down in the shower, use a plastic, non-slip stool. Keep the floor dry. Clean up any water that spills on the floor as soon as it happens. Remove soap buildup in the tub or shower regularly. Attach bath mats securely with double-sided non-slip rug tape. Do not have throw rugs and other things on the floor that can make you trip. What can I do in the  bedroom? Use night lights. Make sure that you have a light by your bed that is easy to reach. Do not use any sheets or blankets that are too big for your bed. They should not hang down onto the floor. Have a firm chair that has side arms. You can use this for support while you get dressed. Do not have throw rugs and other things on the floor that can make you trip. What can I do in the kitchen? Clean up any spills right away. Avoid walking on wet floors. Keep items that you use a lot in easy-to-reach places. If you need to reach something above you, use a strong step stool that has a grab bar. Keep electrical cords out of the way. Do not use floor polish or wax that makes floors slippery. If you must use wax, use non-skid floor wax. Do not have throw rugs and other things on the floor that can make you trip. What can I do with my stairs? Do not leave any items on the stairs. Make sure that there are handrails on both sides of the stairs and use them. Fix handrails that are broken or loose. Make sure that handrails are as long as the stairways. Check any carpeting to make sure that it is firmly attached to the stairs. Fix any carpet that is loose or worn. Avoid having throw rugs at the top or bottom of the stairs. If you do have throw  rugs, attach them to the floor with carpet tape. Make sure that you have a light switch at the top of the stairs and the bottom of the stairs. If you do not have them, ask someone to add them for you. What else can I do to help prevent falls? Wear shoes that: Do not have high heels. Have rubber bottoms. Are comfortable and fit you well. Are closed at the toe. Do not wear sandals. If you use a stepladder: Make sure that it is fully opened. Do not climb a closed stepladder. Make sure that both sides of the stepladder are locked into place. Ask someone to hold it for you, if possible. Clearly mark and make sure that you can see: Any grab bars or  handrails. First and last steps. Where the edge of each step is. Use tools that help you move around (mobility aids) if they are needed. These include: Canes. Walkers. Scooters. Crutches. Turn on the lights when you go into a dark area. Replace any light bulbs as soon as they burn out. Set up your furniture so you have a clear path. Avoid moving your furniture around. If any of your floors are uneven, fix them. If there are any pets around you, be aware of where they are. Review your medicines with your doctor. Some medicines can make you feel dizzy. This can increase your chance of falling. Ask your doctor what other things that you can do to help prevent falls. This information is not intended to replace advice given to you by your health care provider. Make sure you discuss any questions you have with your health care provider. Document Released: 07/24/2009 Document Revised: 03/04/2016 Document Reviewed: 11/01/2014 Elsevier Interactive Patient Education  2017 [employer].

## 2021-12-16 ENCOUNTER — Other Ambulatory Visit: Payer: Self-pay

## 2021-12-16 MED ORDER — VALBENAZINE TOSYLATE 80 MG PO CAPS: 80.0000 mg | ORAL_CAPSULE | Freq: Every day | ORAL | 2 refills | Status: DC

## 2021-12-17 ENCOUNTER — Telehealth: Payer: Self-pay | Admitting: Physician Assistant

## 2021-12-17 NOTE — Telephone Encounter (Signed)
[facility] and they have her on automatic refill. Last refill was sent to a local pharmacy.  ?

## 2021-12-17 NOTE — Telephone Encounter (Signed)
Merrilee Seashore, Pharmacy Tech at [facility] @ 9:25a.  They want a call back regarding the script for the Neurontin '400mg'$  capsule.  Reference # [id number] ? ? ?Next appt 4/20 ?

## 2021-12-31 ENCOUNTER — Telehealth: Payer: Medicare HMO

## 2022-01-01 ENCOUNTER — Other Ambulatory Visit: Payer: Self-pay | Admitting: Physician Assistant

## 2022-01-12 ENCOUNTER — Telehealth: Payer: Self-pay

## 2022-01-12 ENCOUNTER — Telehealth: Payer: Medicare HMO

## 2022-01-12 NOTE — Telephone Encounter (Signed)
?  Care Management  ? ?Follow Up Note ? ? ?01/12/2022 ?Name: KAMRIE FANTON MRN: [id number] DOB: Jun 10, 1946 ? ? ?Referred by: [name], FNP ?Reason for referral : Chronic Care Management (RN CM follow up call ) ? ? ?An unsuccessful telephone outreach was attempted today. The patient was referred to the case management team for assistance with care management and care coordination.  ? ?Follow Up Plan: A HIPPA compliant phone message was left for the patient providing contact information and requesting a return call.  ? ?Barb Merino, RN, BSN, CCM ?Care Management Coordinator ?[facility]  ?Direct Phone: [phone] ? ? ?

## 2022-01-28 ENCOUNTER — Ambulatory Visit: Payer: Medicare HMO | Admitting: Physician Assistant

## 2022-02-03 ENCOUNTER — Other Ambulatory Visit: Payer: Self-pay

## 2022-02-03 MED ORDER — MECLIZINE HCL 12.5 MG PO TABS: 12.5000 mg | ORAL_TABLET | Freq: Three times a day (TID) | ORAL | 0 refills | Status: DC | PRN

## 2022-03-02 ENCOUNTER — Encounter: Payer: Self-pay | Admitting: Nurse Practitioner

## 2022-03-02 ENCOUNTER — Other Ambulatory Visit: Payer: Self-pay

## 2022-03-02 ENCOUNTER — Ambulatory Visit (INDEPENDENT_AMBULATORY_CARE_PROVIDER_SITE_OTHER): Payer: Medicare HMO | Admitting: Nurse Practitioner

## 2022-03-02 VITALS — BP 124/78 | HR 81 | Temp 98.1°F | Ht 69.0 in | Wt 172.0 lb

## 2022-03-02 DIAGNOSIS — E782 Mixed hyperlipidemia: Secondary | ICD-10-CM | POA: Diagnosis not present

## 2022-03-02 DIAGNOSIS — R7309 Other abnormal glucose: Secondary | ICD-10-CM | POA: Diagnosis not present

## 2022-03-02 DIAGNOSIS — Z6825 Body mass index (BMI) 25.0-25.9, adult: Secondary | ICD-10-CM | POA: Diagnosis not present

## 2022-03-02 DIAGNOSIS — F3175 Bipolar disorder, in partial remission, most recent episode depressed: Secondary | ICD-10-CM

## 2022-03-02 DIAGNOSIS — Z5320 Procedure and treatment not carried out because of patient's decision for unspecified reasons: Secondary | ICD-10-CM | POA: Diagnosis not present

## 2022-03-02 DIAGNOSIS — Z23 Encounter for immunization: Secondary | ICD-10-CM

## 2022-03-02 DIAGNOSIS — I10 Essential (primary) hypertension: Secondary | ICD-10-CM | POA: Diagnosis not present

## 2022-03-02 DIAGNOSIS — G2401 Drug induced subacute dyskinesia: Secondary | ICD-10-CM | POA: Diagnosis not present

## 2022-03-02 DIAGNOSIS — R911 Solitary pulmonary nodule: Secondary | ICD-10-CM

## 2022-03-02 DIAGNOSIS — E876 Hypokalemia: Secondary | ICD-10-CM

## 2022-03-02 DIAGNOSIS — R69 Illness, unspecified: Secondary | ICD-10-CM | POA: Diagnosis not present

## 2022-03-02 DIAGNOSIS — R0609 Other forms of dyspnea: Secondary | ICD-10-CM | POA: Diagnosis not present

## 2022-03-02 MED ORDER — ALBUTEROL SULFATE HFA 108 (90 BASE) MCG/ACT IN AERS: 2.0000 | INHALATION_SPRAY | Freq: Four times a day (QID) | RESPIRATORY_TRACT | 2 refills | Status: DC | PRN

## 2022-03-02 MED ORDER — MECLIZINE HCL 12.5 MG PO TABS
12.5000 mg | ORAL_TABLET | Freq: Three times a day (TID) | ORAL | 0 refills | Status: DC | PRN
Start: 2022-03-02 — End: 2022-05-31

## 2022-03-02 NOTE — Progress Notes (Signed)
I,Victoria T Hamilton,acting as a Education administrator for [name], FNP.,have documented all relevant documentation on the behalf of [name], FNP,as directed by  [name], FNP while in the presence of [name], [place].   This visit occurred during the SARS-CoV-2 public health emergency.  Safety protocols were in place, including screening questions prior to the visit, additional usage of staff PPE, and extensive cleaning of exam room while observing appropriate contact time as indicated for disinfecting solutions.  Subjective:     Patient ID: Judy [name] , female    DOB: Feb 13, 1946 , 76 y.o.   MRN: [id number]   Chief Complaint  Patient presents with   Hypertension   Hyperlipidemia    HPI  Pt presents today for bp & cholesterol follow up. She has been short of breath for 2 weeks, patient asks if she could get an inhaler prescribed. Reports she has been having shortness of breath for 3 weeks denies chest pain.   Hypertension This is a chronic problem. The current episode started more than 1 year ago. The problem is controlled. Associated symptoms include shortness of breath. Pertinent negatives include no anxiety, chest pain or palpitations. There are no associated agents to hypertension. Risk factors for coronary artery disease include sedentary lifestyle. There are no compliance problems.  There is no history of angina.    Past Medical History:  Diagnosis Date   Allergic rhinitis    Anemia    Anxiety    Arthritis    Bipolar 1 disorder (HCC)    Depression    Diverticulosis of colon (without mention of hemorrhage) 08/25/2011   [name]   Hearing loss in left ear    since birth   Hyperlipidemia    Hypertension    Obesity    Renal disorder    Vertigo      Family History  Problem Relation Age of Onset   Prostate cancer Brother    Hypertension Brother    Kidney disease Brother    Colon cancer Brother    Hypertension Mother    Kidney disease Mother    Stomach cancer  Father    Hyperlipidemia Sister    Hypothyroidism Sister    Stroke Brother    Prostate cancer Brother      Current Outpatient Medications:    albuterol (VENTOLIN HFA) 108 (90 Base) MCG/ACT inhaler, Inhale 2 puffs into the lungs every 6 (six) hours as needed for wheezing or shortness of breath., Disp: 8 g, Rfl: 2   gabapentin (NEURONTIN) 600 MG tablet, Take 1 tablet (600 mg total) by mouth 3 (three) times daily., Disp: 90 tablet, Rfl: 0   atorvastatin (LIPITOR) 10 MG tablet, Take 1 tablet (10 mg total) by mouth daily. (Patient not taking: Reported on 03/02/2022), Disp: 90 tablet, Rfl: 1   buPROPion (WELLBUTRIN XL) 150 MG 24 hr tablet, TAKE 1 TABLET DAILY (Patient not taking: Reported on 03/02/2022), Disp: 90 tablet, Rfl: 1   gabapentin (NEURONTIN) 100 MG capsule, Take 1 capsule (100 mg total) by mouth 3 (three) times daily. Take with 400 mg pills= 500 mg tid. (Patient not taking: Reported on 03/02/2022), Disp: 270 capsule, Rfl: 1   gabapentin (NEURONTIN) 400 MG capsule, Take 1 capsule (400 mg total) by mouth 3 (three) times daily. (Patient not taking: Reported on 03/02/2022), Disp: 180 capsule, Rfl: 0   meclizine (ANTIVERT) 12.5 MG tablet, Take 1 tablet (12.5 mg total) by mouth 3 (three) times daily as needed for dizziness., Disp: 30 tablet, Rfl: 0  valbenazine (INGREZZA) 80 MG capsule, Take 1 capsule (80 mg total) by mouth daily., Disp: 30 capsule, Rfl: 0   No Known Allergies   Review of Systems  Constitutional: Negative.   HENT:  Positive for congestion.   Respiratory:  Positive for shortness of breath. Negative for cough and wheezing.   Cardiovascular: Negative.  Negative for chest pain, palpitations and leg swelling.  Neurological: Negative.   Psychiatric/Behavioral: Negative.      Today's Vitals   03/02/22 1441  BP: 124/78  Pulse: 81  Temp: 98.1 F (36.7 C)  SpO2: 98%  Weight: 172 lb (78 kg)  Height: _0  (1.753 m)  PainSc: 0-No pain   Body mass index is 25.4 kg/m.  Wt  Readings from Last 3 Encounters:  03/02/22 172 lb (78 kg)  12/03/21 175 lb (79.4 kg)  10/29/21 182 lb (82.6 kg)    Objective:  Physical Exam Vitals reviewed.  Constitutional:      General: She is not in acute distress.    Appearance: Normal appearance. She is well-developed.  HENT:     Head: Normocephalic and atraumatic.  Cardiovascular:     Rate and Rhythm: Normal rate and regular rhythm.     Pulses: Normal pulses.     Heart sounds: Normal heart sounds. No murmur heard. Pulmonary:     Effort: Pulmonary effort is normal. No respiratory distress.     Breath sounds: Normal breath sounds. No wheezing.  Skin:    General: Skin is warm and dry.     Capillary Refill: Capillary refill takes less than 2 seconds.  Neurological:     General: No focal deficit present.     Mental Status: She is alert and oriented to person, place, and time.     Cranial Nerves: No cranial nerve deficit.     Motor: No weakness.  Psychiatric:        Mood and Affect: Mood normal.        Behavior: Behavior normal.        Thought Content: Thought content normal.        Judgment: Judgment normal.        Assessment And Plan:     1. Essential hypertension Comments: Blood pressure is well controlled. Continue current medications.  - CMP14+EGFR - CBC  2. Mixed hyperlipidemia Comments: Stable, continue statin, toleratting well. - CMP14+EGFR - CBC - Hemoglobin A1c - Lipid panel  3. Hypokalemia Comments: Levels were low at last visit will recheck levels. Encouraged to eat foods high in potassium  4. Tardive dyskinesia Comments: Likely related to her long history of mental health medications  5. Depressed bipolar I disorder in partial remission ([place]) Comments: Continue follow up with Psychiatry  6. Body mass index (BMI) of 25.0 to 25.9 in adult  7. Immunization due Comments: [employer] form complete for shingrix - Varicella-zoster vaccine IM (Shingrix)  8. Dyspnea on exertion Comments: Given  address to have a CXR done while waiting for CT scan approval.  - albuterol (VENTOLIN HFA) 108 (90 Base) MCG/ACT inhaler; Inhale 2 puffs into the lungs every 6 (six) hours as needed for wheezing or shortness of breath.  Dispense: 8 g; Refill: 2 - DG Chest 2 View; Future - Brain natriuretic peptide  9. Lung nodule Comments: Will recheck CT scan lung - CT CHEST NODULE FOLLOW UP WO CONTRAST; Future  10. Colonoscopy refused Comments: Discussed risk of colon cancer.      Patient was given opportunity to ask questions. Patient verbalized understanding of  the plan and was able to repeat key elements of the plan. All questions were answered to their satisfaction.  [name], FNP    I, [name], FNP, have reviewed all documentation for this visit. The documentation on 03/02/22 for the exam, diagnosis, procedures, and orders are all accurate and complete.  IF YOU HAVE BEEN REFERRED TO A SPECIALIST, IT MAY TAKE 1-2 WEEKS TO SCHEDULE/PROCESS THE REFERRAL. IF YOU HAVE NOT HEARD FROM US/SPECIALIST IN TWO WEEKS, PLEASE GIVE [place] A CALL AT [phone].   THE PATIENT IS ENCOURAGED TO PRACTICE SOCIAL DISTANCING DUE TO THE COVID-19 PANDEMIC.

## 2022-03-03 LAB — CMP14+EGFR
ALT: 17 IU/L (ref 0–32)
AST: 23 IU/L (ref 0–40)
Albumin/Globulin Ratio: 1.6 (ref 1.2–2.2)
Albumin: 4.3 g/dL (ref 3.7–4.7)
Alkaline Phosphatase: 74 IU/L (ref 44–121)
BUN/Creatinine Ratio: 16 (ref 12–28)
BUN: 16 mg/dL (ref 8–27)
Bilirubin Total: 0.3 mg/dL (ref 0.0–1.2)
CO2: 21 mmol/L (ref 20–29)
Calcium: 10 mg/dL (ref 8.7–10.3)
Chloride: 108 mmol/L — ABNORMAL HIGH (ref 96–106)
Creatinine, Ser: 1.01 mg/dL — ABNORMAL HIGH (ref 0.57–1.00)
Globulin, Total: 2.7 g/dL (ref 1.5–4.5)
Glucose: 124 mg/dL — ABNORMAL HIGH (ref 70–99)
Potassium: 4.2 mmol/L (ref 3.5–5.2)
Sodium: 145 mmol/L — ABNORMAL HIGH (ref 134–144)
Total Protein: 7 g/dL (ref 6.0–8.5)
eGFR: 58 mL/min/{1.73_m2} — ABNORMAL LOW (ref >59)

## 2022-03-03 LAB — CBC
Hematocrit: 42.2 % (ref 34.0–46.6)
Hemoglobin: 14 g/dL (ref 11.1–15.9)
MCH: 30.1 pg (ref 26.6–33.0)
MCHC: 33.2 g/dL (ref 31.5–35.7)
MCV: 91 fL (ref 79–97)
Platelets: 259 10*3/uL (ref 150–450)
RBC: 4.65 x10E6/uL (ref 3.77–5.28)
RDW: 13.1 % (ref 11.7–15.4)
WBC: 6.1 10*3/uL (ref 3.4–10.8)

## 2022-03-03 LAB — LIPID PANEL
Chol/HDL Ratio: 2.2 ratio (ref 0.0–4.4)
Cholesterol, Total: 156 mg/dL (ref 100–199)
HDL: 70 mg/dL (ref >39)
LDL Chol Calc (NIH): 71 mg/dL (ref 0–99)
Triglycerides: 77 mg/dL (ref 0–149)
VLDL Cholesterol Cal: 15 mg/dL (ref 5–40)

## 2022-03-03 LAB — HEMOGLOBIN A1C
Est. average glucose Bld gHb Est-mCnc: 111 mg/dL
Hgb A1c MFr Bld: 5.5 % (ref 4.8–5.6)

## 2022-03-03 LAB — BRAIN NATRIURETIC PEPTIDE: BNP: 14.6 pg/mL (ref 0.0–100.0)

## 2022-03-04 ENCOUNTER — Telehealth: Payer: Self-pay

## 2022-03-04 ENCOUNTER — Telehealth: Payer: Medicare HMO

## 2022-03-04 NOTE — Telephone Encounter (Signed)
  Care Management   Follow Up Note   03/04/2022 Name: Judy [name] MRN: [id number] DOB: 12-01-1945   Referred by: [name], FNP Reason for referral : Chronic Care Management (RN CM Follow up call )   A second unsuccessful telephone outreach was attempted today. The patient was referred to the case management team for assistance with care management and care coordination.   Follow Up Plan: A HIPPA compliant phone message was left for the patient providing contact information and requesting a return call.   Barb Merino, RN, BSN, CCM Care Management Coordinator [facility]  Direct Phone: [phone]

## 2022-03-11 ENCOUNTER — Other Ambulatory Visit: Payer: Self-pay

## 2022-03-11 MED ORDER — VALBENAZINE TOSYLATE 80 MG PO CAPS: 80.0000 mg | ORAL_CAPSULE | Freq: Every day | ORAL | 0 refills | Status: DC

## 2022-03-24 ENCOUNTER — Telehealth: Payer: Self-pay

## 2022-03-24 ENCOUNTER — Telehealth: Payer: Medicare HMO

## 2022-03-24 NOTE — Telephone Encounter (Signed)
  Care Management   Follow Up Note   03/24/2022 Name: Judy [name] MRN: [id number] DOB: 1945/10/31   Referred by: [name], FNP Reason for referral : Chronic Care Management (RN CM Follow up call #3)   Third unsuccessful telephone outreach was attempted today. The patient was referred to the case management team for assistance with care management and care coordination. The patient's primary care provider has been notified of our unsuccessful attempts to make or maintain contact with the patient. The care management team is pleased to engage with this patient at any time in the future should he/she be interested in assistance from the care management team.   Follow Up Plan: A HIPPA compliant phone message was left for the patient providing contact information and requesting a return call.   Barb Merino, RN, BSN, CCM Care Management Coordinator [facility]  Direct Phone: [phone]

## 2022-04-09 ENCOUNTER — Other Ambulatory Visit: Payer: Self-pay

## 2022-04-09 MED ORDER — VALBENAZINE TOSYLATE 80 MG PO CAPS: 80.0000 mg | ORAL_CAPSULE | Freq: Every day | ORAL | 1 refills | Status: DC

## 2022-04-14 ENCOUNTER — Ambulatory Visit: Payer: Self-pay

## 2022-04-14 ENCOUNTER — Telehealth: Payer: Medicare HMO

## 2022-04-14 DIAGNOSIS — G2401 Drug induced subacute dyskinesia: Secondary | ICD-10-CM

## 2022-04-14 DIAGNOSIS — I10 Essential (primary) hypertension: Secondary | ICD-10-CM

## 2022-04-14 DIAGNOSIS — F3175 Bipolar disorder, in partial remission, most recent episode depressed: Secondary | ICD-10-CM

## 2022-04-14 DIAGNOSIS — E876 Hypokalemia: Secondary | ICD-10-CM

## 2022-04-14 DIAGNOSIS — E782 Mixed hyperlipidemia: Secondary | ICD-10-CM

## 2022-04-14 NOTE — Chronic Care Management (AMB) (Signed)
  Care Management   Follow Up Note   04/14/2022 Name: Judy [name] MRN: [id number] DOB: [birth year]   Referred by: [name], FNP Reason for referral : Chronic Care Management (RN CM Follow up call #3)   Third unsuccessful telephone outreach was attempted today. The patient was referred to the case management team for assistance with care management and care coordination. The patient's primary care provider has been notified of our unsuccessful attempts to make or maintain contact with the patient. The care management team is pleased to engage with this patient at any time in the future should he/she be interested in assistance from the care management team.   Follow Up Plan: We have been unable to make contact with the patient for follow up. The care management team is available to follow up with the patient after provider conversation with the patient regarding recommendation for care management engagement and subsequent re-referral to the care management team.   Barb Merino, RN, BSN, CCM Care Management Coordinator [facility]  Direct Phone: [phone]

## 2022-04-20 ENCOUNTER — Observation Stay (HOSPITAL_COMMUNITY): Payer: Medicare HMO

## 2022-04-20 ENCOUNTER — Other Ambulatory Visit: Payer: Self-pay

## 2022-04-20 ENCOUNTER — Inpatient Hospital Stay (HOSPITAL_COMMUNITY): Payer: Medicare HMO

## 2022-04-20 ENCOUNTER — Emergency Department (HOSPITAL_COMMUNITY): Payer: Medicare HMO

## 2022-04-20 ENCOUNTER — Other Ambulatory Visit (HOSPITAL_COMMUNITY): Payer: Medicare HMO

## 2022-04-20 ENCOUNTER — Encounter (HOSPITAL_COMMUNITY): Payer: Self-pay

## 2022-04-20 ENCOUNTER — Inpatient Hospital Stay (HOSPITAL_COMMUNITY)
Admission: EM | Admit: 2022-04-20 | Discharge: 2022-04-28 | DRG: 312 | Disposition: A | Discharge status: Skilled Nursing Facility | Payer: Medicare HMO | Attending: Internal Medicine | Admitting: Internal Medicine

## 2022-04-20 DIAGNOSIS — M48061 Spinal stenosis, lumbar region without neurogenic claudication: Secondary | ICD-10-CM | POA: Diagnosis present

## 2022-04-20 DIAGNOSIS — T796XXA Traumatic ischemia of muscle, initial encounter: Secondary | ICD-10-CM | POA: Diagnosis not present

## 2022-04-20 DIAGNOSIS — M4802 Spinal stenosis, cervical region: Secondary | ICD-10-CM | POA: Diagnosis present

## 2022-04-20 DIAGNOSIS — R9431 Abnormal electrocardiogram [ECG] [EKG]: Secondary | ICD-10-CM

## 2022-04-20 DIAGNOSIS — Y92009 Unspecified place in unspecified non-institutional (private) residence as the place of occurrence of the external cause: Secondary | ICD-10-CM

## 2022-04-20 DIAGNOSIS — G8929 Other chronic pain: Secondary | ICD-10-CM | POA: Diagnosis present

## 2022-04-20 DIAGNOSIS — M47816 Spondylosis without myelopathy or radiculopathy, lumbar region: Secondary | ICD-10-CM | POA: Diagnosis present

## 2022-04-20 DIAGNOSIS — S0990XA Unspecified injury of head, initial encounter: Secondary | ICD-10-CM | POA: Diagnosis not present

## 2022-04-20 DIAGNOSIS — W19XXXA Unspecified fall, initial encounter: Secondary | ICD-10-CM | POA: Diagnosis present

## 2022-04-20 DIAGNOSIS — E860 Dehydration: Secondary | ICD-10-CM | POA: Diagnosis present

## 2022-04-20 DIAGNOSIS — N950 Postmenopausal bleeding: Secondary | ICD-10-CM | POA: Diagnosis present

## 2022-04-20 DIAGNOSIS — G2401 Drug induced subacute dyskinesia: Secondary | ICD-10-CM | POA: Diagnosis present

## 2022-04-20 DIAGNOSIS — E785 Hyperlipidemia, unspecified: Secondary | ICD-10-CM | POA: Diagnosis present

## 2022-04-20 DIAGNOSIS — J309 Allergic rhinitis, unspecified: Secondary | ICD-10-CM | POA: Diagnosis not present

## 2022-04-20 DIAGNOSIS — R531 Weakness: Secondary | ICD-10-CM | POA: Diagnosis not present

## 2022-04-20 DIAGNOSIS — M1990 Unspecified osteoarthritis, unspecified site: Secondary | ICD-10-CM | POA: Diagnosis not present

## 2022-04-20 DIAGNOSIS — N939 Abnormal uterine and vaginal bleeding, unspecified: Secondary | ICD-10-CM

## 2022-04-20 DIAGNOSIS — M19012 Primary osteoarthritis, left shoulder: Secondary | ICD-10-CM | POA: Diagnosis not present

## 2022-04-20 DIAGNOSIS — S199XXA Unspecified injury of neck, initial encounter: Secondary | ICD-10-CM | POA: Diagnosis not present

## 2022-04-20 DIAGNOSIS — R29818 Other symptoms and signs involving the nervous system: Secondary | ICD-10-CM | POA: Diagnosis not present

## 2022-04-20 DIAGNOSIS — I10 Essential (primary) hypertension: Secondary | ICD-10-CM | POA: Diagnosis present

## 2022-04-20 DIAGNOSIS — Z043 Encounter for examination and observation following other accident: Secondary | ICD-10-CM | POA: Diagnosis not present

## 2022-04-20 DIAGNOSIS — F419 Anxiety disorder, unspecified: Secondary | ICD-10-CM | POA: Diagnosis not present

## 2022-04-20 DIAGNOSIS — R69 Illness, unspecified: Secondary | ICD-10-CM | POA: Diagnosis not present

## 2022-04-20 DIAGNOSIS — G8191 Hemiplegia, unspecified affecting right dominant side: Secondary | ICD-10-CM | POA: Diagnosis not present

## 2022-04-20 DIAGNOSIS — Z8249 Family history of ischemic heart disease and other diseases of the circulatory system: Secondary | ICD-10-CM

## 2022-04-20 DIAGNOSIS — H9192 Unspecified hearing loss, left ear: Secondary | ICD-10-CM | POA: Diagnosis not present

## 2022-04-20 DIAGNOSIS — M5412 Radiculopathy, cervical region: Secondary | ICD-10-CM | POA: Diagnosis not present

## 2022-04-20 DIAGNOSIS — Z83438 Family history of other disorder of lipoprotein metabolism and other lipidemia: Secondary | ICD-10-CM

## 2022-04-20 DIAGNOSIS — D259 Leiomyoma of uterus, unspecified: Secondary | ICD-10-CM | POA: Diagnosis not present

## 2022-04-20 DIAGNOSIS — M6282 Rhabdomyolysis: Secondary | ICD-10-CM | POA: Diagnosis not present

## 2022-04-20 DIAGNOSIS — I493 Ventricular premature depolarization: Secondary | ICD-10-CM | POA: Diagnosis not present

## 2022-04-20 DIAGNOSIS — F32A Depression, unspecified: Secondary | ICD-10-CM

## 2022-04-20 DIAGNOSIS — Z86711 Personal history of pulmonary embolism: Secondary | ICD-10-CM

## 2022-04-20 DIAGNOSIS — F319 Bipolar disorder, unspecified: Secondary | ICD-10-CM | POA: Diagnosis present

## 2022-04-20 DIAGNOSIS — B351 Tinea unguium: Secondary | ICD-10-CM | POA: Diagnosis present

## 2022-04-20 DIAGNOSIS — Z66 Do not resuscitate: Secondary | ICD-10-CM | POA: Diagnosis not present

## 2022-04-20 DIAGNOSIS — M4807 Spinal stenosis, lumbosacral region: Secondary | ICD-10-CM | POA: Diagnosis present

## 2022-04-20 DIAGNOSIS — M5127 Other intervertebral disc displacement, lumbosacral region: Secondary | ICD-10-CM | POA: Diagnosis not present

## 2022-04-20 DIAGNOSIS — W1839XA Other fall on same level, initial encounter: Secondary | ICD-10-CM | POA: Diagnosis present

## 2022-04-20 DIAGNOSIS — M160 Bilateral primary osteoarthritis of hip: Secondary | ICD-10-CM | POA: Diagnosis not present

## 2022-04-20 DIAGNOSIS — M5137 Other intervertebral disc degeneration, lumbosacral region: Secondary | ICD-10-CM | POA: Diagnosis present

## 2022-04-20 DIAGNOSIS — R55 Syncope and collapse: Principal | ICD-10-CM | POA: Diagnosis present

## 2022-04-20 DIAGNOSIS — R569 Unspecified convulsions: Secondary | ICD-10-CM | POA: Diagnosis not present

## 2022-04-20 DIAGNOSIS — I6782 Cerebral ischemia: Secondary | ICD-10-CM | POA: Diagnosis not present

## 2022-04-20 DIAGNOSIS — Z79899 Other long term (current) drug therapy: Secondary | ICD-10-CM

## 2022-04-20 DIAGNOSIS — M19011 Primary osteoarthritis, right shoulder: Secondary | ICD-10-CM | POA: Diagnosis not present

## 2022-04-20 DIAGNOSIS — I491 Atrial premature depolarization: Secondary | ICD-10-CM | POA: Diagnosis not present

## 2022-04-20 DIAGNOSIS — Z743 Need for continuous supervision: Secondary | ICD-10-CM | POA: Diagnosis not present

## 2022-04-20 LAB — URINALYSIS, ROUTINE W REFLEX MICROSCOPIC
Bilirubin Urine: NEGATIVE
Glucose, UA: NEGATIVE mg/dL
Ketones, ur: 5 mg/dL — AB
Leukocytes,Ua: NEGATIVE
Nitrite: NEGATIVE
Protein, ur: NEGATIVE mg/dL
Specific Gravity, Urine: 1.006 (ref 1.005–1.030)
pH: 5 (ref 5.0–8.0)

## 2022-04-20 LAB — COMPREHENSIVE METABOLIC PANEL
ALT: 25 U/L (ref 0–44)
AST: 77 U/L — ABNORMAL HIGH (ref 15–41)
Albumin: 3.6 g/dL (ref 3.5–5.0)
Alkaline Phosphatase: 51 U/L (ref 38–126)
Anion gap: 13 (ref 5–15)
BUN: 17 mg/dL (ref 8–23)
CO2: 20 mmol/L — ABNORMAL LOW (ref 22–32)
Calcium: 9.4 mg/dL (ref 8.9–10.3)
Chloride: 109 mmol/L (ref 98–111)
Creatinine, Ser: 1.09 mg/dL — ABNORMAL HIGH (ref 0.44–1.00)
GFR, Estimated: 53 mL/min — ABNORMAL LOW (ref >60)
Glucose, Bld: 93 mg/dL (ref 70–99)
Potassium: 3.5 mmol/L (ref 3.5–5.1)
Sodium: 142 mmol/L (ref 135–145)
Total Bilirubin: 0.8 mg/dL (ref 0.3–1.2)
Total Protein: 6.2 g/dL — ABNORMAL LOW (ref 6.5–8.1)

## 2022-04-20 LAB — CBC
HCT: 41.4 % (ref 36.0–46.0)
Hemoglobin: 14.2 g/dL (ref 12.0–15.0)
MCH: 30.6 pg (ref 26.0–34.0)
MCHC: 34.3 g/dL (ref 30.0–36.0)
MCV: 89.2 fL (ref 80.0–100.0)
Platelets: 227 10*3/uL (ref 150–400)
RBC: 4.64 MIL/uL (ref 3.87–5.11)
RDW: 14.4 % (ref 11.5–15.5)
WBC: 14.1 10*3/uL — ABNORMAL HIGH (ref 4.0–10.5)
nRBC: 0 % (ref 0.0–0.2)

## 2022-04-20 LAB — CBC WITH DIFFERENTIAL/PLATELET
Abs Immature Granulocytes: 0.02 10*3/uL (ref 0.00–0.07)
Basophils Absolute: 0 10*3/uL (ref 0.0–0.1)
Basophils Relative: 0 %
Eosinophils Absolute: 0 10*3/uL (ref 0.0–0.5)
Eosinophils Relative: 0 %
HCT: 14.6 % — ABNORMAL LOW (ref 36.0–46.0)
Hemoglobin: 4.7 g/dL — CL (ref 12.0–15.0)
Immature Granulocytes: 0 %
Lymphocytes Relative: 7 %
Lymphs Abs: 0.4 10*3/uL — ABNORMAL LOW (ref 0.7–4.0)
MCH: 31.1 pg (ref 26.0–34.0)
MCHC: 32.2 g/dL (ref 30.0–36.0)
MCV: 96.7 fL (ref 80.0–100.0)
Monocytes Absolute: 0.5 10*3/uL (ref 0.1–1.0)
Monocytes Relative: 10 %
Neutro Abs: 4.4 10*3/uL (ref 1.7–7.7)
Neutrophils Relative %: 83 %
Platelets: 79 10*3/uL — ABNORMAL LOW (ref 150–400)
RBC: 1.51 MIL/uL — ABNORMAL LOW (ref 3.87–5.11)
RDW: 14.5 % (ref 11.5–15.5)
Smear Review: DECREASED
WBC: 5.2 10*3/uL (ref 4.0–10.5)
nRBC: 0 % (ref 0.0–0.2)

## 2022-04-20 LAB — CK: Total CK: 2868 U/L — ABNORMAL HIGH (ref 38–234)

## 2022-04-20 LAB — CBG MONITORING, ED: Glucose-Capillary: 108 mg/dL — ABNORMAL HIGH (ref 70–99)

## 2022-04-20 MED ORDER — ALBUTEROL SULFATE HFA 108 (90 BASE) MCG/ACT IN AERS
2.0000 | INHALATION_SPRAY | Freq: Four times a day (QID) | RESPIRATORY_TRACT | Status: DC | PRN
Start: 2022-04-20 — End: 2022-04-20

## 2022-04-20 MED ORDER — SODIUM CHLORIDE 0.9 % IV SOLN
INTRAVENOUS | Status: DC
Start: 2022-04-20 — End: 2022-04-20

## 2022-04-20 MED ORDER — POLYETHYLENE GLYCOL 3350 17 G PO PACK
17.0000 g | PACK | Freq: Every day | ORAL | Status: DC | PRN
  Administered 2022-04-26: 17 g via ORAL
  Filled 2022-04-20: qty 1

## 2022-04-20 MED ORDER — ALBUTEROL SULFATE (2.5 MG/3ML) 0.083% IN NEBU: 2.5000 mg | INHALATION_SOLUTION | Freq: Four times a day (QID) | RESPIRATORY_TRACT | Status: DC | PRN

## 2022-04-20 MED ORDER — ACETAMINOPHEN 650 MG RE SUPP: 650.0000 mg | Freq: Four times a day (QID) | RECTAL | Status: DC | PRN

## 2022-04-20 MED ORDER — ACETAMINOPHEN 325 MG PO TABS: 650.0000 mg | ORAL_TABLET | Freq: Four times a day (QID) | ORAL | Status: DC | PRN

## 2022-04-20 MED ORDER — SODIUM CHLORIDE 0.9 % IV BOLUS
1000.0000 mL | Freq: Once | INTRAVENOUS | Status: AC
  Administered 2022-04-20: 1000 mL via INTRAVENOUS

## 2022-04-20 MED ORDER — SODIUM CHLORIDE 0.9 % IV SOLN: INTRAVENOUS | Status: DC

## 2022-04-20 MED ORDER — ENOXAPARIN SODIUM 40 MG/0.4ML IJ SOSY
40.0000 mg | PREFILLED_SYRINGE | INTRAMUSCULAR | Status: DC
Start: 2022-04-20 — End: 2022-04-21
  Administered 2022-04-20: 40 mg via SUBCUTANEOUS
  Filled 2022-04-20: qty 0.4

## 2022-04-20 MED ORDER — GABAPENTIN 400 MG PO CAPS
400.0000 mg | ORAL_CAPSULE | Freq: Three times a day (TID) | ORAL | Status: DC
  Administered 2022-04-20 – 2022-04-28 (×23): 400 mg via ORAL
  Filled 2022-04-20 (×23): qty 1

## 2022-04-20 MED ORDER — VALBENAZINE TOSYLATE 40 MG PO CAPS
80.0000 mg | ORAL_CAPSULE | Freq: Every day | ORAL | Status: DC
Start: 2022-04-20 — End: 2022-04-22
  Administered 2022-04-20 – 2022-04-21 (×2): 80 mg via ORAL
  Filled 2022-04-20 (×3): qty 2

## 2022-04-20 MED ORDER — MECLIZINE HCL 12.5 MG PO TABS
12.5000 mg | ORAL_TABLET | Freq: Three times a day (TID) | ORAL | Status: DC | PRN
Start: 2022-04-20 — End: 2022-04-28

## 2022-04-20 NOTE — Assessment & Plan Note (Signed)
noted 

## 2022-04-20 NOTE — Progress Notes (Signed)
Pt unavailable at this time. Went to MRI. Will attempt later when schedule permits

## 2022-04-20 NOTE — ED Provider Notes (Addendum)
[facility] EMERGENCY DEPARTMENT Provider Note   CSN: [id number] Arrival date & time: 04/20/22  1019     History  Chief Complaint  Patient presents with   Loss of Consciousness    Judy [name] is a 76 y.o. female.  Pt is a 76 yo female who has a pmhx significant for htn, hld, arthritis, and bipolar d/o.  Pt lives alone and passed out yesterday.  Pt said she lost strength in the right side of her body around the time the mail comes yesterday.  This is 0900 per EMS according to neighbors.  Pt did not regain consciousness until this morning when she activated her medic alert button.  EMS found her on the ground covered in feces and  urine.  Pt still feels the right side of her body is weak.  EMS was able to get her up and she walked a few steps, but she required a lot of assistance.  She normally walks without any issues.       Home Medications Prior to Admission medications   Medication Sig Start Date End Date Taking? Authorizing Provider  albuterol (VENTOLIN HFA) 108 (90 Base) MCG/ACT inhaler Inhale 2 puffs into the lungs every 6 (six) hours as needed for wheezing or shortness of breath. 03/02/22   [name], FNP  atorvastatin (LIPITOR) 10 MG tablet Take 1 tablet (10 mg total) by mouth daily. Patient not taking: Reported on 03/02/2022 10/19/21 10/19/22  [name], FNP  buPROPion (WELLBUTRIN XL) 150 MG 24 hr tablet TAKE 1 TABLET DAILY Patient not taking: Reported on 03/02/2022 01/04/22   Donnal Moat T, PA-C  gabapentin (NEURONTIN) 100 MG capsule Take 1 capsule (100 mg total) by mouth 3 (three) times daily. Take with 400 mg pills= 500 mg tid. Patient not taking: Reported on 03/02/2022 07/31/21   Donnal Moat T, PA-C  gabapentin (NEURONTIN) 400 MG capsule Take 1 capsule (400 mg total) by mouth 3 (three) times daily. Patient not taking: Reported on 03/02/2022 12/02/21   Donnal Moat T, PA-C  gabapentin (NEURONTIN) 600 MG tablet Take 1 tablet (600 mg total) by mouth  3 (three) times daily. 08/28/21   Addison Lank, PA-C  meclizine (ANTIVERT) 12.5 MG tablet Take 1 tablet (12.5 mg total) by mouth 3 (three) times daily as needed for dizziness. 03/02/22   [name], FNP  valbenazine [facility]) 80 MG capsule Take 1 capsule (80 mg total) by mouth daily. 04/09/22   Addison Lank, PA-C      Allergies    Patient has no known allergies.    Review of Systems   Review of Systems  Neurological:  Positive for weakness.  All other systems reviewed and are negative.   Physical Exam Updated Vital Signs BP (!) 131/91   Pulse 75   Temp 98.5 F (36.9 C) (Oral)   Resp 20   Ht '5\' 9"'$  (1.753 m)   Wt 78 kg   SpO2 100%   BMI 25.39 kg/m  Physical Exam Vitals and nursing note reviewed.  Constitutional:      Appearance: Normal appearance.  HENT:     Head: Normocephalic and atraumatic.     Right Ear: External ear normal.     Left Ear: External ear normal.     Nose: Nose normal.     Mouth/Throat:     Mouth: Mucous membranes are dry.  Eyes:     Extraocular Movements: Extraocular movements intact.     Conjunctiva/sclera: Conjunctivae normal.  Pupils: Pupils are equal, round, and reactive to light.  Cardiovascular:     Rate and Rhythm: Normal rate and regular rhythm.     Pulses: Normal pulses.     Heart sounds: Normal heart sounds.  Pulmonary:     Effort: Pulmonary effort is normal.     Breath sounds: Normal breath sounds.  Abdominal:     General: Abdomen is flat. Bowel sounds are normal.     Palpations: Abdomen is soft.  Musculoskeletal:     Cervical back: Normal range of motion and neck supple.  Skin:    General: Skin is warm.     Capillary Refill: Capillary refill takes less than 2 seconds.  Neurological:     Mental Status: She is alert.     Comments: Right sided weakness  Psychiatric:        Mood and Affect: Mood normal.     ED Results / Procedures / Treatments   Labs (all labs ordered are listed, but only abnormal results are  displayed) Labs Reviewed  CBC WITH DIFFERENTIAL/PLATELET - Abnormal; Notable for the following components:      Result Value   RBC 1.51 (*)    Hemoglobin 4.7 (*)    HCT 14.6 (*)    Platelets 79 (*)    Lymphs Abs 0.4 (*)    All other components within normal limits  URINALYSIS, ROUTINE W REFLEX MICROSCOPIC - Abnormal; Notable for the following components:   Hgb urine dipstick SMALL (*)    Ketones, ur 5 (*)    Bacteria, UA RARE (*)    All other components within normal limits  CBC - Abnormal; Notable for the following components:   WBC 14.1 (*)    All other components within normal limits  CK - Abnormal; Notable for the following components:   Total CK 2,868 (*)    All other components within normal limits  COMPREHENSIVE METABOLIC PANEL - Abnormal; Notable for the following components:   CO2 20 (*)    Creatinine, Ser 1.09 (*)    Total Protein 6.2 (*)    AST 77 (*)    GFR, Estimated 53 (*)    All other components within normal limits  CBG MONITORING, ED - Abnormal; Notable for the following components:   Glucose-Capillary 108 (*)    All other components within normal limits    EKG EKG Interpretation  Date/Time:  Tuesday April 20 2022 10:58:52 EDT Ventricular Rate:  59 PR Interval:  123 QRS Duration: 97 QT Interval:  420 QTC Calculation: 416 R Axis:   14 Text Interpretation: Sinus rhythm Multiform ventricular premature complexes Nonspecific T abnormalities, diffuse leads No significant change since last tracing Confirmed by Isla Pence [phone]) on 04/20/2022 11:00:19 AM  Radiology MR BRAIN WO CONTRAST  Result Date: 04/20/2022 CLINICAL DATA:  Neuro deficit, acute, stroke suspected EXAM: MRI HEAD WITHOUT CONTRAST TECHNIQUE: Multiplanar, multiecho pulse sequences of the brain and surrounding structures were obtained without intravenous contrast. COMPARISON:  2018 FINDINGS: Brain: There is no acute infarction or intracranial hemorrhage. There is no intracranial mass, mass  effect, or edema. There is no hydrocephalus or extra-axial fluid collection. Ventricles and sulci are within normal limits in size and configuration. Patchy and confluent areas of T2 hyperintensity in the supratentorial and pontine white matter are nonspecific but probably reflect moderate to marked chronic microvascular ischemic changes. There are prominent perivascular spaces and likely some superimposed chronic small vessel infarcts of the central white matter and deep gray nuclei. Few scattered punctate  foci of susceptibility likely reflect chronic microhemorrhages with a distribution suggesting hypertension as the underlying etiology. Vascular: Major vessel flow voids at the skull base are preserved. Skull and upper cervical spine: Normal marrow signal is preserved. Sinuses/Orbits: Mild mucosal thickening.  Orbits are unremarkable. Other: Sella is unremarkable.  Mastoid air cells are clear. IMPRESSION: No acute infarction, hemorrhage, or mass. Moderate to marked chronic microvascular ischemic changes. Mild burden of chronic microhemorrhages likely secondary to hypertension. Electronically Signed   By: Macy Mis M.D.   On: 04/20/2022 13:37   CT Head Wo Contrast  Result Date: 04/20/2022 CLINICAL DATA:  Neck trauma EXAM: CT HEAD WITHOUT CONTRAST CT CERVICAL SPINE WITHOUT CONTRAST TECHNIQUE: Multidetector CT imaging of the head and cervical spine was performed following the standard protocol without intravenous contrast. Multiplanar CT image reconstructions of the cervical spine were also generated. RADIATION DOSE REDUCTION: This exam was performed according to the departmental dose-optimization program which includes automated exposure control, adjustment of the mA and/or kV according to patient size and/or use of iterative reconstruction technique. COMPARISON:  Head and cervical spine dated July 07, 2018 FINDINGS: CT HEAD FINDINGS Brain: Chronic white matter ischemic change. No evidence of acute  infarction, hemorrhage, hydrocephalus, extra-axial collection or mass lesion/mass effect. Vascular: No hyperdense vessel or unexpected calcification. Skull: Normal. Negative for fracture or focal lesion. Sinuses/Orbits: No acute finding. Other: None. CT CERVICAL SPINE FINDINGS Alignment: Normal. Skull base and vertebrae: No acute fracture. No primary bone lesion or focal pathologic process. Soft tissues and spinal canal: No prevertebral fluid or swelling. No visible canal hematoma. Disc levels: Moderate multilevel degenerative disc disease, unchanged when compared to prior exam. Upper chest: Negative. Other: None. IMPRESSION: 1. No acute intracranial abnormality. 2. No evidence of acute cervical spine fracture or traumatic malalignment. Electronically Signed   By: Yetta Glassman M.D.   On: 04/20/2022 11:33   CT Cervical Spine Wo Contrast  Result Date: 04/20/2022 CLINICAL DATA:  Neck trauma EXAM: CT HEAD WITHOUT CONTRAST CT CERVICAL SPINE WITHOUT CONTRAST TECHNIQUE: Multidetector CT imaging of the head and cervical spine was performed following the standard protocol without intravenous contrast. Multiplanar CT image reconstructions of the cervical spine were also generated. RADIATION DOSE REDUCTION: This exam was performed according to the departmental dose-optimization program which includes automated exposure control, adjustment of the mA and/or kV according to patient size and/or use of iterative reconstruction technique. COMPARISON:  Head and cervical spine dated July 07, 2018 FINDINGS: CT HEAD FINDINGS Brain: Chronic white matter ischemic change. No evidence of acute infarction, hemorrhage, hydrocephalus, extra-axial collection or mass lesion/mass effect. Vascular: No hyperdense vessel or unexpected calcification. Skull: Normal. Negative for fracture or focal lesion. Sinuses/Orbits: No acute finding. Other: None. CT CERVICAL SPINE FINDINGS Alignment: Normal. Skull base and vertebrae: No acute  fracture. No primary bone lesion or focal pathologic process. Soft tissues and spinal canal: No prevertebral fluid or swelling. No visible canal hematoma. Disc levels: Moderate multilevel degenerative disc disease, unchanged when compared to prior exam. Upper chest: Negative. Other: None. IMPRESSION: 1. No acute intracranial abnormality. 2. No evidence of acute cervical spine fracture or traumatic malalignment. Electronically Signed   By: Yetta Glassman M.D.   On: 04/20/2022 11:33   DG Pelvis Portable  Result Date: 04/20/2022 CLINICAL DATA:  Provided history: Fall. EXAM: PORTABLE PELVIS 1-2 VIEWS COMPARISON:  Radiographs of the pelvis 07/07/2018. FINDINGS: Internal rotation of the right femur. No appreciable acute fracture. Mild degenerative changes of the bilateral femoroacetabular joints. Lower lumbar spondylosis. IMPRESSION: Internal  rotation of the right femur. No appreciable acute fracture on this single AP view radiograph. Degenerative changes of the bilateral femoroacetabular joints and lower lumbar spine. Electronically Signed   By: Kellie Simmering D.O.   On: 04/20/2022 10:58   DG Humerus Right  Result Date: 04/20/2022 CLINICAL DATA:  [id number] syncope EXAM: RIGHT HUMERUS - 2+ VIEW COMPARISON:  None Available. FINDINGS: Two views, 3 images of the right humerus were obtained. There is no evidence of fracture or other focal bone lesions. Soft tissues are unremarkable. Mild AC joint degenerative/hypertrophic changes. IMPRESSION: No fracture of the humerus seen. Mild degenerative/hypertrophic changes at the [facility] joint. Electronically Signed   By: Frazier Richards M.D.   On: 04/20/2022 10:56   DG Chest Port 1 View  Result Date: 04/20/2022 CLINICAL DATA:  Provided history: Fall. EXAM: PORTABLE CHEST 1 VIEW COMPARISON:  Prior chest radiographs 11/15/2019 and earlier. Chest CT 11/15/2019. FINDINGS: Heart size within normal limits. No appreciable airspace consolidation or pulmonary edema. No evidence of pleural  effusion or pneumothorax. No acute bony abnormality identified. Levocurvature of the thoracic spine, possibly positional. IMPRESSION: No evidence of acute cardiopulmonary abnormality. Electronically Signed   By: Kellie Simmering D.O.   On: 04/20/2022 10:54    Procedures Procedures    Medications Ordered in ED Medications  sodium chloride 0.9 % bolus 1,000 mL (1,000 mLs Intravenous New Bag/Given 04/20/22 1126)    And  0.9 %  sodium chloride infusion ( Intravenous New Bag/Given 04/20/22 1126)  sodium chloride 0.9 % bolus 1,000 mL (1,000 mLs Intravenous New Bag/Given 04/20/22 1426)    ED Course/ Medical Decision Making/ A&P                           Medical Decision Making Amount and/or Complexity of Data Reviewed Labs: ordered. Radiology: ordered. ECG/medicine tests: ordered.  Risk Prescription drug management. Decision regarding hospitalization.   This patient presents to the ED for concern of weakness, this involves an extensive number of treatment options, and is a complaint that carries with it a high risk of complications and morbidity.  The differential diagnosis includes cva, cardiac event, electrolyte abn, infection   Co morbidities that complicate the patient evaluation  htn, hld, arthritis, and bipolar d/o   Additional history obtained:  Additional history obtained from epic chart review External records from outside source obtained and reviewed including EMS report   Lab Tests:  I Ordered, and personally interpreted labs.  The pertinent results include:  initial cbc showed hgb of 4.7.  I believed that was diluted, so we took another sample and hgb nl.  Wbc slightly elevated at 14.1; cmp with slight elevation of cr at 1.09 (chronic); ua + ketones, no uti; ck 2868   Imaging Studies ordered:  I ordered imaging studies including chest, right humerus, pelvis  I independently visualized and interpreted imaging which showed  CXR: IMPRESSION:  No evidence of acute  cardiopulmonary abnormality.  Humerus: IMPRESSION:  No fracture of the humerus seen. Mild degenerative/hypertrophic  changes at the [facility] joint.  Pelvis: IMPRESSION:  Internal rotation of the right femur.    No appreciable acute fracture on this single AP view radiograph.    Degenerative changes of the bilateral femoroacetabular joints and  lower lumbar spine.  CT head and C-spine: IMPRESSION:  1. No acute intracranial abnormality.  2. No evidence of acute cervical spine fracture or traumatic  malalignment.   MRI brain: IMPRESSION:  No acute infarction, hemorrhage,  or mass.    Moderate to marked chronic microvascular ischemic changes.    Mild burden of chronic microhemorrhages likely secondary to  hypertension.   I agree with the radiologist interpretation   Cardiac Monitoring:  The patient was maintained on a cardiac monitor.  I personally viewed and interpreted the cardiac monitored which showed an underlying rhythm of: nsr   Medicines ordered and prescription drug management:  I ordered medication including ivfs  for dehydration  Reevaluation of the patient after these medicines showed that the patient improved I have reviewed the patients home medicines and have made adjustments as needed   Test Considered:  mri   Critical Interventions:  Mri, ivfs   Problem List / ED Course:  Syncope:  etiology unclear.  No evidence of CVA on MRI.  Dr. Rory Percy recommends MRI cervical spine.  I have ordered this test.  I have discussed pt with Dr. Florene Glen (triad) who will admit. Rhabdomyolysis:  pt given IVFs   Reevaluation:  After the interventions noted above, I reevaluated the patient and found that they have :improved   Social Determinants of Health:  Lives alone   Dispostion:  After consideration of the diagnostic results and the patients response to treatment, I feel that the patent would benefit from admission.    CRITICAL CARE Performed by: Isla Pence   Total critical care time: 30 minutes  Critical care time was exclusive of separately billable procedures and treating other patients.  Critical care was necessary to treat or prevent imminent or life-threatening deterioration.  Critical care was time spent personally by me on the following activities: development of treatment plan with patient and/or surrogate as well as nursing, discussions with consultants, evaluation of patient's response to treatment, examination of patient, obtaining history from patient or surrogate, ordering and performing treatments and interventions, ordering and review of laboratory studies, ordering and review of radiographic studies, pulse oximetry and re-evaluation of patient's condition.         Final Clinical Impression(s) / ED Diagnoses Final diagnoses:  Syncope, unspecified syncope type  Traumatic rhabdomyolysis, initial encounter [facility])    Rx / Arnett Orders ED Discharge Orders     None         Isla Pence, MD 04/20/22 [place], Achol Azpeitia, MD 04/20/22 1536

## 2022-04-20 NOTE — Assessment & Plan Note (Signed)
Passed out, LOC reported from 7/10-11? Check orthostatics when able, pending Echo with EF 82-88%, grade 1 diastolic dysfunction EEG without seizures or epileptiform discharges

## 2022-04-20 NOTE — H&P (Signed)
History and Physical    Judy [name] YFV:[id number] DOB: May 28, 1946 DOA: 04/20/2022  PCP: [name], FNP  Patient coming from: home  I have personally briefly reviewed patient's old medical records in [place]  Chief Complaint: syncope, R sided weakness, found down  HPI: Judy [name] is Judy [name] 76 y.o. female with medical history significant of bipolar 1, depression, HTN, hx PE, hx tardive dyskinesia and multiple other medical problems who reportedly went to the bathroom yesterday morning and passed out.  She woke up this morning on the floor covered in fecal matter.  Reportedly she reported R sided weakness in the right side before she fell.    She reports she was going to the bathroom and then passed out.  She's unable to tell me whether she had R sided weakness before the LOC, but this was reported in the ED triage note.  She did not regain consciousness until this morning when she activated her medic alert button.  She was covered in feces and urine when whe was found.  She was able to walk Judy [name] few steps with EMS, but continues to have right sided weakness.  She typically walks without assistance.  She denies fevers.  Notes chills.  Denies chest pain or SOB, denies nausea.    ED Course: imaging.  MRI brain without stroke.  MRI spine pending.  Admit for R sided weakness, syncope.  Review of Systems: As per HPI otherwise all other systems reviewed and are negative.  Past Medical History:  Diagnosis Date   Allergic rhinitis    Anemia    Anxiety    Arthritis    Bipolar 1 disorder (HCC)    Depression    Diverticulosis of colon (without mention of hemorrhage) 08/25/2011   [name]   Hearing loss in left ear    since birth   Hyperlipidemia    Hypertension    Obesity    Renal disorder    Vertigo     Past Surgical History:  Procedure Laterality Date   COLONOSCOPY  08/25/2011   Procedure: COLONOSCOPY;  Surgeon: Landry Dyke, MD;  Location: [facility];  Service:  Endoscopy;  Laterality: N/[name];   DILATION AND CURETTAGE OF UTERUS  1960's    Social History  reports that she has never smoked. She has never used smokeless tobacco. She reports that she does not drink alcohol and does not use drugs.  No Known Allergies  Family History  Problem Relation Age of Onset   Prostate cancer Brother    Hypertension Brother    Kidney disease Brother    Colon cancer Brother    Hypertension Mother    Kidney disease Mother    Stomach cancer Father    Hyperlipidemia Sister    Hypothyroidism Sister    Stroke Brother    Prostate cancer Brother    Prior to Admission medications   Medication Sig Start Date End Date Taking? Authorizing Provider  albuterol (VENTOLIN HFA) 108 (90 Base) MCG/ACT inhaler Inhale 2 puffs into the lungs every 6 (six) hours as needed for wheezing or shortness of breath. 03/02/22  Yes [name], FNP  gabapentin (NEURONTIN) 400 MG capsule Take 1 capsule (400 mg total) by mouth 3 (three) times daily. 12/02/21  Yes Hurst, Dorothea Glassman, PA-C  meclizine (ANTIVERT) 12.5 MG tablet Take 1 tablet (12.5 mg total) by mouth 3 (three) times daily as needed for dizziness. 03/02/22  Yes [name], FNP  valbenazine [facility]) 80 MG capsule Take 1  capsule (80 mg total) by mouth daily. 04/09/22  Yes Donnal Moat T, PA-C  atorvastatin (LIPITOR) 10 MG tablet Take 1 tablet (10 mg total) by mouth daily. Patient not taking: Reported on 03/02/2022 10/19/21 10/19/22  [name], FNP  buPROPion (WELLBUTRIN XL) 150 MG 24 hr tablet TAKE 1 TABLET DAILY Patient not taking: Reported on 03/02/2022 01/04/22   Addison Lank, [place]    Physical Exam: Vitals:   04/20/22 1230 04/20/22 1245 04/20/22 1350 04/20/22 1530  BP: (!) 163/81 (!) 144/89 (!) 131/91 131/62  Pulse: 74 71 75 81  Resp: '17 18 20 20  '$ Temp:      TempSrc:      SpO2: 98% 99% 100% 100%  Weight:      Height:        Constitutional: NAD, calm, comfortable Vitals:   04/20/22 1230 04/20/22 1245 04/20/22 1350  04/20/22 1530  BP: (!) 163/81 (!) 144/89 (!) 131/91 131/62  Pulse: 74 71 75 81  Resp: '17 18 20 20  '$ Temp:      TempSrc:      SpO2: 98% 99% 100% 100%  Weight:      Height:       Eyes: PERRL, lids and conjunctivae normal ENMT: Mucous membranes are moist. Posterior pharynx clear of any exudate or lesions.Normal dentition.  Neck: normal, supple, no masses, no thyromegaly Respiratory: clear to auscultation bilaterally, no wheezing, no crackles. Normal respiratory effort. No accessory muscle use.  Cardiovascular: Regular rate and rhythm, no murmurs / rubs / gallops. No extremity edema. 2+ pedal pulses. No carotid bruits.  Abdomen: no tenderness, no masses palpated. No hepatosplenomegaly. Bowel sounds positive.  Musculoskeletal: no clubbing / cyanosis. No joint deformity upper and lower extremities. Good ROM, no contractures. Normal muscle tone.  Skin: no rashes, lesions, ulcers. No induration Neurologic: CN 2-12 grossly intact. Sensation intact, DTR normal. Strength 5/5 in all 4.  Psychiatric: Normal judgment and insight. Alert and oriented x 3. Normal mood.   Labs on Admission: I have personally reviewed following labs and imaging studies  CBC: Recent Labs  Lab 04/20/22 1137 04/20/22 1259  WBC 5.2 14.1*  NEUTROABS 4.4  --   HGB 4.7* 14.2  HCT 14.6* 41.4  MCV 96.7 89.2  PLT 79* 409    Basic Metabolic Panel: Recent Labs  Lab 04/20/22 1259  NA 142  K 3.5  CL 109  CO2 20*  GLUCOSE 93  BUN 17  CREATININE 1.09*  CALCIUM 9.4    GFR: Estimated Creatinine Clearance: 47.3 mL/min (Tira Lafferty) (by C-G formula based on SCr of 1.09 mg/dL (H)).  Liver Function Tests: Recent Labs  Lab 04/20/22 1259  AST 77*  ALT 25  ALKPHOS 51  BILITOT 0.8  PROT 6.2*  ALBUMIN 3.6    Urine analysis:    Component Value Date/Time   COLORURINE YELLOW 04/20/2022 1340   APPEARANCEUR CLEAR 04/20/2022 1340   LABSPEC 1.006 04/20/2022 1340   PHURINE 5.0 04/20/2022 1340   GLUCOSEU NEGATIVE 04/20/2022  1340   HGBUR SMALL (Judy [name]) 04/20/2022 1340   BILIRUBINUR NEGATIVE 04/20/2022 1340   BILIRUBINUR negative 10/29/2021 1712   KETONESUR 5 (Judy [name]) 04/20/2022 1340   PROTEINUR NEGATIVE 04/20/2022 1340   UROBILINOGEN 1.0 10/29/2021 1712   UROBILINOGEN 1.0 12/10/2013 2001   NITRITE NEGATIVE 04/20/2022 1340   LEUKOCYTESUR NEGATIVE 04/20/2022 1340    Radiological Exams on Admission: MR BRAIN WO CONTRAST  Result Date: 04/20/2022 CLINICAL DATA:  Neuro deficit, acute, stroke suspected EXAM: MRI HEAD WITHOUT CONTRAST TECHNIQUE: Multiplanar,  multiecho pulse sequences of the brain and surrounding structures were obtained without intravenous contrast. COMPARISON:  2018 FINDINGS: Brain: There is no acute infarction or intracranial hemorrhage. There is no intracranial mass, mass effect, or edema. There is no hydrocephalus or extra-axial fluid collection. Ventricles and sulci are within normal limits in size and configuration. Patchy and confluent areas of T2 hyperintensity in the supratentorial and pontine white matter are nonspecific but probably reflect moderate to marked chronic microvascular ischemic changes. There are prominent perivascular spaces and likely some superimposed chronic small vessel infarcts of the central white matter and deep gray nuclei. Few scattered punctate foci of susceptibility likely reflect chronic microhemorrhages with Marlon Suleiman distribution suggesting hypertension as the underlying etiology. Vascular: Major vessel flow voids at the skull base are preserved. Skull and upper cervical spine: Normal marrow signal is preserved. Sinuses/Orbits: Mild mucosal thickening.  Orbits are unremarkable. Other: Sella is unremarkable.  Mastoid air cells are clear. IMPRESSION: No acute infarction, hemorrhage, or mass. Moderate to marked chronic microvascular ischemic changes. Mild burden of chronic microhemorrhages likely secondary to hypertension. Electronically Signed   By: Macy Mis M.D.   On: 04/20/2022 13:37    CT Head Wo Contrast  Result Date: 04/20/2022 CLINICAL DATA:  Neck trauma EXAM: CT HEAD WITHOUT CONTRAST CT CERVICAL SPINE WITHOUT CONTRAST TECHNIQUE: Multidetector CT imaging of the head and cervical spine was performed following the standard protocol without intravenous contrast. Multiplanar CT image reconstructions of the cervical spine were also generated. RADIATION DOSE REDUCTION: This exam was performed according to the departmental dose-optimization program which includes automated exposure control, adjustment of the mA and/or kV according to patient size and/or use of iterative reconstruction technique. COMPARISON:  Head and cervical spine dated July 07, 2018 FINDINGS: CT HEAD FINDINGS Brain: Chronic white matter ischemic change. No evidence of acute infarction, hemorrhage, hydrocephalus, extra-axial collection or mass lesion/mass effect. Vascular: No hyperdense vessel or unexpected calcification. Skull: Normal. Negative for fracture or focal lesion. Sinuses/Orbits: No acute finding. Other: None. CT CERVICAL SPINE FINDINGS Alignment: Normal. Skull base and vertebrae: No acute fracture. No primary bone lesion or focal pathologic process. Soft tissues and spinal canal: No prevertebral fluid or swelling. No visible canal hematoma. Disc levels: Moderate multilevel degenerative disc disease, unchanged when compared to prior exam. Upper chest: Negative. Other: None. IMPRESSION: 1. No acute intracranial abnormality. 2. No evidence of acute cervical spine fracture or traumatic malalignment. Electronically Signed   By: Yetta Glassman M.D.   On: 04/20/2022 11:33   CT Cervical Spine Wo Contrast  Result Date: 04/20/2022 CLINICAL DATA:  Neck trauma EXAM: CT HEAD WITHOUT CONTRAST CT CERVICAL SPINE WITHOUT CONTRAST TECHNIQUE: Multidetector CT imaging of the head and cervical spine was performed following the standard protocol without intravenous contrast. Multiplanar CT image reconstructions of the cervical  spine were also generated. RADIATION DOSE REDUCTION: This exam was performed according to the departmental dose-optimization program which includes automated exposure control, adjustment of the mA and/or kV according to patient size and/or use of iterative reconstruction technique. COMPARISON:  Head and cervical spine dated July 07, 2018 FINDINGS: CT HEAD FINDINGS Brain: Chronic white matter ischemic change. No evidence of acute infarction, hemorrhage, hydrocephalus, extra-axial collection or mass lesion/mass effect. Vascular: No hyperdense vessel or unexpected calcification. Skull: Normal. Negative for fracture or focal lesion. Sinuses/Orbits: No acute finding. Other: None. CT CERVICAL SPINE FINDINGS Alignment: Normal. Skull base and vertebrae: No acute fracture. No primary bone lesion or focal pathologic process. Soft tissues and spinal canal: No prevertebral fluid  or swelling. No visible canal hematoma. Disc levels: Moderate multilevel degenerative disc disease, unchanged when compared to prior exam. Upper chest: Negative. Other: None. IMPRESSION: 1. No acute intracranial abnormality. 2. No evidence of acute cervical spine fracture or traumatic malalignment. Electronically Signed   By: Yetta Glassman M.D.   On: 04/20/2022 11:33   DG Pelvis Portable  Result Date: 04/20/2022 CLINICAL DATA:  Provided history: Fall. EXAM: PORTABLE PELVIS 1-2 VIEWS COMPARISON:  Radiographs of the pelvis 07/07/2018. FINDINGS: Internal rotation of the right femur. No appreciable acute fracture. Mild degenerative changes of the bilateral femoroacetabular joints. Lower lumbar spondylosis. IMPRESSION: Internal rotation of the right femur. No appreciable acute fracture on this single AP view radiograph. Degenerative changes of the bilateral femoroacetabular joints and lower lumbar spine. Electronically Signed   By: Kellie Simmering D.O.   On: 04/20/2022 10:58   DG Humerus Right  Result Date: 04/20/2022 CLINICAL DATA:  [id number]  syncope EXAM: RIGHT HUMERUS - 2+ VIEW COMPARISON:  None Available. FINDINGS: Two views, 3 images of the right humerus were obtained. There is no evidence of fracture or other focal bone lesions. Soft tissues are unremarkable. Mild AC joint degenerative/hypertrophic changes. IMPRESSION: No fracture of the humerus seen. Mild degenerative/hypertrophic changes at the [facility] joint. Electronically Signed   By: Frazier Richards M.D.   On: 04/20/2022 10:56   DG Chest Port 1 View  Result Date: 04/20/2022 CLINICAL DATA:  Provided history: Fall. EXAM: PORTABLE CHEST 1 VIEW COMPARISON:  Prior chest radiographs 11/15/2019 and earlier. Chest CT 11/15/2019. FINDINGS: Heart size within normal limits. No appreciable airspace consolidation or pulmonary edema. No evidence of pleural effusion or pneumothorax. No acute bony abnormality identified. Levocurvature of the thoracic spine, possibly positional. IMPRESSION: No evidence of acute cardiopulmonary abnormality. Electronically Signed   By: Kellie Simmering D.O.   On: 04/20/2022 10:54    EKG: Independently reviewed. Abnormal EKG, she's shaking which I think is affecting baseline, EKG repeat appears consistent with sinus with p waves followed by qrs noted in V5, will follow on tele  Assessment/Plan Principal Problem:   Syncope Active Problems:   Fall at home, initial encounter   Right sided weakness   Rhabdomyolysis   Chronic pain   Depression   History of pulmonary embolism   Bipolar 1 disorder (HCC)   Tardive dyskinesia    Assessment and Plan: * Syncope Passed out, LOC reported from 7/10-11? Check orthostatics when able Echo EEG  Fall at home, initial encounter In setting of syncope Plain films of chest, pelvis, right humerus negative Head CT without acute intracranial abnormality C spine without acute intracranial abnormality L shoulder plain films pending PT/OT Workup for syncope, right sided weakness ongoing  Right sided weakness Right sided weakness  and tremor MRI brain without acute infarction, moderate to marked chronic microvascular ischemic changes, mild burden of chronic microhemorrhages Follow pending C spine MRI Will discuss results with neuro and w/u additionally as indicated PT/OT  Rhabdomyolysis Elevated CK to >2000 Small Hb, but no RBC's noted Follow CK trend IVF  Chronic pain Continue gabapentin  Depression Not taking wellbutrin anymore  History of pulmonary embolism Not currently on any anticoagulation This appears to have been diagnosed in 11/2019 was treated with eliquis at that time  Tardive dyskinesia ingrezza  Bipolar 1 disorder ([place]) noted     DVT prophylaxis: lovenox  Code Status:   full  Family Communication:  None at bedside  Disposition Plan:   Patient is from:  home  Anticipated DC to:  home  Anticipated DC date:  3-4 days  Anticipated DC barriers: Pending evaluation for R sided weakness, syncope  Consults called:  none  Admission status:  inpatietn   Severity of Illness: The appropriate patient status for this patient is INPATIENT. Inpatient status is judged to be reasonable and necessary in order to provide the required intensity of service to ensure the patient's safety. The patient's presenting symptoms, physical exam findings, and initial radiographic and laboratory data in the context of their chronic comorbidities is felt to place them at high risk for further clinical deterioration. Furthermore, it is not anticipated that the patient will be medically stable for discharge from the hospital within 2 midnights of admission.   * I certify that at the point of admission it is my clinical judgment that the patient will require inpatient hospital care spanning beyond 2 midnights from the point of admission due to high intensity of service, high risk for further deterioration and high frequency of surveillance required.Fayrene Helper MD [facility]  How to contact the [facility]  Attending or Consulting provider [place] or covering provider during after hours [place], for this patient?   Check the care team in [facility] and look for Vernell Back) attending/consulting [facility] provider listed and b) the [facility] team listed Log into [email or web address] and use [place]'s universal password to access. If you do not have the password, please contact the hospital operator. Locate the [facility] provider you are looking for under [facility] and page to Tovah Slavick number that you can be directly reached. If you still have difficulty reaching the provider, please page the [facility] (Director on Call) for the Hospitalists listed on amion for assistance.  04/20/2022, 5:49 PM

## 2022-04-20 NOTE — Assessment & Plan Note (Signed)
Not currently on any anticoagulation This appears to have been diagnosed in 11/2019 was treated with eliquis at that time

## 2022-04-20 NOTE — Assessment & Plan Note (Signed)
Not taking wellbutrin anymore

## 2022-04-20 NOTE — Assessment & Plan Note (Addendum)
Elevated CK to >2000 Small Hb, but no RBC's noted Follow CK trend - relatively stable today, trend IVF, continue

## 2022-04-20 NOTE — Progress Notes (Signed)
EEG complete - results pending 

## 2022-04-20 NOTE — Assessment & Plan Note (Addendum)
ingrezza on hold -> tremor has improved with this -> more notable tardive dyskinesia since holding, she wishes to continue to hold ingrezza Follow outpatient with psych

## 2022-04-20 NOTE — Assessment & Plan Note (Addendum)
In setting of syncope Plain films of chest, pelvis, right humerus negative Head CT without acute intracranial abnormality C spine without acute intracranial abnormality L shoulder plain films negative PT/OT -> recommending snf Workup for syncope, right sided weakness ongoing

## 2022-04-20 NOTE — ED Triage Notes (Signed)
Pt BIB [facility] from home c/o a syncopal episode. Pt states she went to the bathroom yesterday morning and woke up this morning in the floor covered in fecal matter. Pt states she lost movement in her right side right before she fell. Pt thinks she was unconscious all night long.

## 2022-04-20 NOTE — ED Notes (Signed)
Patient transported to MRI 

## 2022-04-20 NOTE — Assessment & Plan Note (Addendum)
Right sided weakness and tremor MRI brain without acute infarction, moderate to marked chronic microvascular ischemic changes, mild burden of chronic microhemorrhages MRI C spine with severe right and mild L foraminal stneosis with mild to moderate canal stenosis at C4-5.  Moderate right and mild L foraminal stenosis with mild canal stenosis at C5-6.  At c6-7 mild to moderate L>R foraminal stenosis.  At c3-4 mild bilateral foraminal stenosis and mild canal stenosis. Per discussion with neurology, possibly compressive neuropathy from being found down? Improved weakness (since tremor improved after stopping ingrezza) MRI L spine with progressive multilevel lumbar spondylosis, severe canal stenosis with severe bilateral foraminal stenosis at L4-5, moderate to severe R and mild L foraminal stenosis at L5-S1 PT/OT Outpatient neurosurgery for L spine findings

## 2022-04-20 NOTE — Assessment & Plan Note (Signed)
Continue gabapentin.

## 2022-04-21 ENCOUNTER — Inpatient Hospital Stay (HOSPITAL_COMMUNITY): Payer: Medicare HMO

## 2022-04-21 DIAGNOSIS — B351 Tinea unguium: Secondary | ICD-10-CM

## 2022-04-21 DIAGNOSIS — R569 Unspecified convulsions: Secondary | ICD-10-CM

## 2022-04-21 DIAGNOSIS — R55 Syncope and collapse: Secondary | ICD-10-CM | POA: Diagnosis not present

## 2022-04-21 DIAGNOSIS — N950 Postmenopausal bleeding: Secondary | ICD-10-CM

## 2022-04-21 DIAGNOSIS — N939 Abnormal uterine and vaginal bleeding, unspecified: Secondary | ICD-10-CM

## 2022-04-21 DIAGNOSIS — R9431 Abnormal electrocardiogram [ECG] [EKG]: Secondary | ICD-10-CM

## 2022-04-21 LAB — COMPREHENSIVE METABOLIC PANEL
ALT: 25 U/L (ref 0–44)
AST: 74 U/L — ABNORMAL HIGH (ref 15–41)
Albumin: 3 g/dL — ABNORMAL LOW (ref 3.5–5.0)
Alkaline Phosphatase: 45 U/L (ref 38–126)
Anion gap: 6 (ref 5–15)
BUN: 13 mg/dL (ref 8–23)
CO2: 22 mmol/L (ref 22–32)
Calcium: 9 mg/dL (ref 8.9–10.3)
Chloride: 115 mmol/L — ABNORMAL HIGH (ref 98–111)
Creatinine, Ser: 1 mg/dL (ref 0.44–1.00)
GFR, Estimated: 59 mL/min — ABNORMAL LOW (ref >60)
Glucose, Bld: 92 mg/dL (ref 70–99)
Potassium: 3 mmol/L — ABNORMAL LOW (ref 3.5–5.1)
Sodium: 143 mmol/L (ref 135–145)
Total Bilirubin: 0.7 mg/dL (ref 0.3–1.2)
Total Protein: 5.5 g/dL — ABNORMAL LOW (ref 6.5–8.1)

## 2022-04-21 LAB — CBC
HCT: 37.4 % (ref 36.0–46.0)
Hemoglobin: 12.4 g/dL (ref 12.0–15.0)
MCH: 30.5 pg (ref 26.0–34.0)
MCHC: 33.2 g/dL (ref 30.0–36.0)
MCV: 91.9 fL (ref 80.0–100.0)
Platelets: 204 10*3/uL (ref 150–400)
RBC: 4.07 MIL/uL (ref 3.87–5.11)
RDW: 14.9 % (ref 11.5–15.5)
WBC: 8.5 10*3/uL (ref 4.0–10.5)
nRBC: 0 % (ref 0.0–0.2)

## 2022-04-21 LAB — ECHOCARDIOGRAM COMPLETE
AR max vel: 2.26 cm2
AV Peak grad: 5.8 mmHg
Ao pk vel: 1.2 m/s
Area-P 1/2: 3.23 cm2
Calc EF: 62 %
Height: 69 in
S' Lateral: 2.9 cm
Single Plane A2C EF: 58.4 %
Single Plane A4C EF: 63.5 %
Weight: 2751.34 oz

## 2022-04-21 LAB — CK: Total CK: 1812 U/L — ABNORMAL HIGH (ref 38–234)

## 2022-04-21 LAB — SEDIMENTATION RATE: Sed Rate: 11 mm/hr (ref 0–22)

## 2022-04-21 LAB — C-REACTIVE PROTEIN: CRP: 0.8 mg/dL (ref <1.0)

## 2022-04-21 LAB — HEMOGLOBIN AND HEMATOCRIT, BLOOD
HCT: 41 % (ref 36.0–46.0)
Hemoglobin: 13 g/dL (ref 12.0–15.0)

## 2022-04-21 NOTE — Consult Note (Addendum)
Cardiology Consultation:   Patient ID: Judy [name] MRN: [id number]; DOB: 01-21-1946  Admit date: 04/20/2022 Date of Consult: 04/21/2022  PCP:  [name], [place] Providers Cardiologist:  None     Patient Profile:   Judy [name] is a 76 y.o. female with a hx of bipolar type I, depression, hypertension, history of PE, history of tar dive dyskinesia, who is being seen 04/21/2022 for the evaluation of A-fib/flutter at the request of Dr. Florene Glen.  History of Present Illness:   Judy [name] 76 year old female with above medical history.  Per chart review, patient was briefly seen by [facility] in 2017-2018.  At that time, patient was referred to cardiology for evaluation of shortness of breath, chest heaviness, syncopal episodes.  Underwent echocardiogram on 10/18/2016 that showed LVEF 55-60%, normal wall motion, grade 1 diastolic dysfunction.  Also had a nuclear stress test on 10/19/2016 that showed EF 60%, a large defect of moderate severity that was partially reversible.  Given the normal LVEF, this likely represented shifting breast tissue attenuation artifact but they could not rule out ischemia.  Considering the patient was not having chest pain, did not proceed with cardiac catheterization.  It was strongly believe that the nuclear study was abnormal because of shifting breast attenuation.  Patient has not been seen by cardiology since 12/2016  Patient was brought into the emergency department by EMS on 7/11 after she had a syncopal episode.  Patient reports that she went to the bathroom on the morning of 7/10 and passed out.  She woke up the morning of 7/11 covered in fecal matter.  Patient reportedly had right-sided weakness before she fell.  She was covered in feces and urine when she was found.  She was able to walk few steps with EMS, but continued to have right-sided weakness.  Patient was admitted for evaluation and management of syncope, weakness, fall.  Head CT was  without acute intracranial abnormality, C-spine without acute intracranial abnormality.  Plain films of chest, pelvis, right humerus negative.  MRI brain without acute infarction, moderate to marked chronic microvascular ischemic changes.  Labs in the ED showed Na 142, K 3.5, creatinine 1.09, WBC 14.1, hemoglobin 14.2, platelets 14.4. CK elevated to 2868.   On interview, patient reports that on the morning of 7/10 she had gone to the bathroom.  She then had a syncopal episode.  Patient denies having any symptoms prior to syncopal episode, rather just remembers waking up a day later on the morning of 7/11.  Currently, patient continues to feel weak.  Weakness is more noticeable on the right than the left side.  Patient denies any chest pain, palpitations, shortness of breath.  She has had a few presyncopal episodes while in the ED, however no true syncope.  Patient reports that she does have a history of tar dive dyskinesia.  Also reports that her right arm and right leg often "shake a lot." While in the room, I did observe a slight, rhythmic movement in her right arm.   Past Medical History:  Diagnosis Date   Allergic rhinitis    Anemia    Anxiety    Arthritis    Bipolar 1 disorder (HCC)    Depression    Diverticulosis of colon (without mention of hemorrhage) 08/25/2011   [name]   Hearing loss in left ear    since birth   Hyperlipidemia    Hypertension    Obesity    Renal disorder  Vertigo     Past Surgical History:  Procedure Laterality Date   COLONOSCOPY  08/25/2011   Procedure: COLONOSCOPY;  Surgeon: Landry Dyke, MD;  Location: [facility];  Service: Endoscopy;  Laterality: N/A;   DILATION AND CURETTAGE OF UTERUS  1960's     Home Medications:  Prior to Admission medications   Medication Sig Start Date End Date Taking? Authorizing Provider  albuterol (VENTOLIN HFA) 108 (90 Base) MCG/ACT inhaler Inhale 2 puffs into the lungs every 6 (six) hours as needed for wheezing  or shortness of breath. 03/02/22  Yes [name], FNP  gabapentin (NEURONTIN) 400 MG capsule Take 1 capsule (400 mg total) by mouth 3 (three) times daily. 12/02/21  Yes Hurst, Dorothea Glassman, PA-C  meclizine (ANTIVERT) 12.5 MG tablet Take 1 tablet (12.5 mg total) by mouth 3 (three) times daily as needed for dizziness. 03/02/22  Yes [name], FNP  valbenazine [facility]) 80 MG capsule Take 1 capsule (80 mg total) by mouth daily. 04/09/22  Yes Donnal Moat T, PA-C  atorvastatin (LIPITOR) 10 MG tablet Take 1 tablet (10 mg total) by mouth daily. Patient not taking: Reported on 03/02/2022 10/19/21 10/19/22  [name], FNP  buPROPion (WELLBUTRIN XL) 150 MG 24 hr tablet TAKE 1 TABLET DAILY Patient not taking: Reported on 03/02/2022 01/04/22   Addison Lank, PA-C    Inpatient Medications: Scheduled Meds:  enoxaparin (LOVENOX) injection  40 mg Subcutaneous Q24H   gabapentin  400 mg Oral TID   valbenazine  80 mg Oral Daily   Continuous Infusions:  sodium chloride Stopped (04/21/22 0559)   PRN Meds: acetaminophen **OR** acetaminophen, albuterol, meclizine, polyethylene glycol  Allergies:   No Known Allergies  Social History:   Social History   Socioeconomic History   Marital status: Single    Spouse name: Not on file   Number of children: 1   Years of education: 12   Highest education level: Not on file  Occupational History   Occupation: Retired  Tobacco Use   Smoking status: Never   Smokeless tobacco: Never  Vaping Use   Vaping Use: Never used  Substance and Sexual Activity   Alcohol use: No   Drug use: No   Sexual activity: Not Currently  Other Topics Concern   Not on file  Social History Narrative   Lives alone in house, does not need assist device.  Works part time at [employer]. Has a son.     Cousin and sister in close proximity   Right handed   2 cups coffee per day   Social Determinants of Health   Financial Resource Strain: Low Risk  (12/03/2021)    Overall Financial Resource Strain (CARDIA)    Difficulty of Paying Living Expenses: Not hard at all  Food Insecurity: No Food Insecurity (12/03/2021)   Hunger Vital Sign    Worried About Running Out of Food in the Last Year: Never true    Ran Out of Food in the Last Year: Never true  Transportation Needs: No Transportation Needs (12/03/2021)   PRAPARE - Hydrologist (Medical): No    Lack of Transportation (Non-Medical): No  Physical Activity: Insufficiently Active (12/03/2021)   Exercise Vital Sign    Days of Exercise per Week: 3 days    Minutes of Exercise per Session: 30 min  Stress: No Stress Concern Present (12/03/2021)   [place]    Feeling of Stress :  Only a little  Social Connections: Not on file  Intimate Partner Violence: Not At Risk (09/18/2019)   Humiliation, Afraid, Rape, and Kick questionnaire    Fear of Current or Ex-Partner: No    Emotionally Abused: No    Physically Abused: No    Sexually Abused: No    Family History:    Family History  Problem Relation Age of Onset   Prostate cancer Brother    Hypertension Brother    Kidney disease Brother    Colon cancer Brother    Hypertension Mother    Kidney disease Mother    Stomach cancer Father    Hyperlipidemia Sister    Hypothyroidism Sister    Stroke Brother    Prostate cancer Brother      ROS:  Please see the history of present illness.   All other ROS reviewed and negative.     Physical Exam/Data:   Vitals:   04/21/22 0715 04/21/22 0730 04/21/22 0745 04/21/22 0800  BP: 116/75 (!) 146/109 (!) 144/87 (!) 141/67  Pulse: (!) 42 (!) 48 68 (!) 40  Resp: 14 (!) '25 19 16  '$ Temp:      TempSrc:      SpO2: 99% 99% 98% 98%  Weight:      Height:       No intake or output data in the 24 hours ending 04/21/22 1044    04/20/2022   11:53 AM 03/02/2022    2:41 PM 12/03/2021    3:55 PM  Last 3 Weights  Weight (lbs) 171 lb  15.3 oz 172 lb 175 lb  Weight (kg) 78 kg 78.019 kg 79.379 kg     Body mass index is 25.39 kg/m.  General:  Elderly female, uncomfortable and fatigued. No acute distress  HEENT: normal Neck: no JVD Vascular: Radial pulses 2+ bilaterally Cardiac:  normal S1, S2; RRR; no murmur  Lungs:  clear to auscultation bilaterally, no wheezing, rhonchi or rales  Abd: soft, nontender, no hepatomegaly  Ext: no edema Musculoskeletal:  No deformities. Fine tremor noted in right arm intermittently  Skin: warm and dry  Neuro:  CNs 2-12 intact, no focal abnormalities noted. Alert and oriented x3 Psych:  Normal affect, somewhat depressed tone   EKG:  The EKG was personally reviewed and demonstrates:  Initial EKG from the ED showed sinus rhythm with PVCs and PACs, HR 59 BPM.  Telemetry:  Telemetry was personally reviewed and demonstrates:  Telemetry showed predominantly sinus rhythm with periods of sawtooth pattern. When I was in the room, I observed patient have a tremor in her right arm. When the tremor was present, the sawtooth pattern was apparent on telemetry. When the tremor resolved, telemetry returned to normal sinus rhythm.  Relevant CV Studies:  Echocardiogram Pending   Laboratory Data:  High Sensitivity Troponin:  No results for input(s): "TROPONINIHS" in the last 720 hours.   Chemistry Recent Labs  Lab 04/20/22 1259 04/21/22 0413  NA 142 143  K 3.5 3.0*  CL 109 115*  CO2 20* 22  GLUCOSE 93 92  BUN 17 13  CREATININE 1.09* 1.00  CALCIUM 9.4 9.0  GFRNONAA 53* 59*  ANIONGAP 13 6    Recent Labs  Lab 04/20/22 1259 04/21/22 0413  PROT 6.2* 5.5*  ALBUMIN 3.6 3.0*  AST 77* 74*  ALT 25 25  ALKPHOS 51 45  BILITOT 0.8 0.7   Lipids No results for input(s): "CHOL", "TRIG", "HDL", "LABVLDL", "LDLCALC", "CHOLHDL" in the last 168 hours.  Hematology Recent  Labs  Lab 04/20/22 1137 04/20/22 1259 04/21/22 0413  WBC 5.2 14.1* 8.5  RBC 1.51* 4.64 4.07  HGB 4.7* 14.2 12.4  HCT 14.6*  41.4 37.4  MCV 96.7 89.2 91.9  MCH 31.1 30.6 30.5  MCHC 32.2 34.3 33.2  RDW 14.5 14.4 14.9  PLT 79* 227 204   Thyroid No results for input(s): "TSH", "FREET4" in the last 168 hours.  BNPNo results for input(s): "BNP", "PROBNP" in the last 168 hours.  DDimer No results for input(s): "DDIMER" in the last 168 hours.   Radiology/Studies:  EEG adult  Result Date: 04/21/2022 Lora Havens, MD     04/21/2022  8:44 AM Patient Name: LEODA SMITHHART MRN: [id number] Epilepsy Attending: Lora Havens Referring Physician/Provider: Elodia Florence., MD Date: 04/20/2022 Duration: 23.32 mins Patient history: 76 year old female with syncope.  EEG to evaluate for seizure. Level of alertness: Awake, asleep AEDs during EEG study: GBP Technical aspects: This EEG study was done with scalp electrodes positioned according to the 10-20 International system of electrode placement. Electrical activity was acquired at a sampling rate of '500Hz'$  and reviewed with a high frequency filter of '70Hz'$  and a low frequency filter of '1Hz'$ . EEG data were recorded continuously and digitally stored. Description: The posterior dominant rhythm consists of '8Hz'$  activity of moderate voltage (25-35 uV) seen predominantly in posterior head regions, symmetric and reactive to eye opening and eye closing. Sleep was characterized by vertex waves, sleep spindles (12 to 14 Hz), maximal frontocentral region. Hyperventilation and photic stimulation were not performed.   IMPRESSION: This study is within normal limits. No seizures or epileptiform discharges were seen throughout the recording. Lora Havens   DG Shoulder Left  Result Date: 04/20/2022 CLINICAL DATA:  Syncopal episode.  Fall.  Shoulder pain. EXAM: LEFT SHOULDER - 2+ VIEW COMPARISON:  None Available. FINDINGS: There is no evidence of fracture or dislocation. Mild acromioclavicular degenerative change in small bony spur along the inferior aspect of the acromion. Mild glenohumeral  degenerative change. IMPRESSION: No evidence of acute fracture or joint malalignment. Electronically Signed   By: Margaretha Sheffield M.D.   On: 04/20/2022 18:23   MR Cervical Spine Wo Contrast  Result Date: 04/20/2022 CLINICAL DATA:  Cervical radiculopathy, no red flags EXAM: MRI CERVICAL SPINE WITHOUT CONTRAST TECHNIQUE: Multiplanar, multisequence MR imaging of the cervical spine was performed. No intravenous contrast was administered. COMPARISON:  None Available. FINDINGS: Alignment: No substantial sagittal subluxation. Vertebrae: Vertebral body heights are maintained. No focal marrow edema to suggest acute fracture or discitis/osteomyelitis. No suspicious bone lesion. Cord: Normal cord signal. Posterior Fossa, vertebral arteries, paraspinal tissues: Visualized vertebral artery flow voids are maintained. Unremarkable visualized posterior fossa. Disc levels: C2-C3: Small posterior disc osteophyte complex. No significant canal or foraminal stenosis. C3-C4: Small posterior disc osteophyte complex. Bilateral facet and uncovertebral hypertrophy. Mild bilateral foraminal stenosis. Mild canal stenosis. C4-C5: Posterior disc osteophyte complex with ligamentum flavum thickening and right greater than left facet and uncovertebral hypertrophy. Resulting severe right and mild left foraminal stenosis. Mild to moderate canal stenosis. C5-C6: Posterior disc osteophyte complex with right greater than left facet and uncovertebral hypertrophy. Resulting moderate right and mild left foraminal stenosis. Mild canal stenosis. C6-C7: Posterior disc osteophyte complex with bilateral facet and uncovertebral hypertrophy. Resulting mild-to-moderate left greater than right foraminal stenosis. Patent canal. C7-T1: No significant disc protrusion, foraminal stenosis, or canal stenosis. IMPRESSION: 1. At C4-C5, severe right and mild left foraminal stenosis with mild to moderate canal stenosis. 2. At C5-C6,  moderate right and mild left  foraminal stenosis with mild canal stenosis. 3. At C6-C7, mild-to-moderate left greater than right foraminal stenosis. 4. At C3-C4, mild bilateral foraminal stenosis and mild canal stenosis. Electronically Signed   By: Margaretha Sheffield M.D.   On: 04/20/2022 18:11   MR BRAIN WO CONTRAST  Result Date: 04/20/2022 CLINICAL DATA:  Neuro deficit, acute, stroke suspected EXAM: MRI HEAD WITHOUT CONTRAST TECHNIQUE: Multiplanar, multiecho pulse sequences of the brain and surrounding structures were obtained without intravenous contrast. COMPARISON:  2018 FINDINGS: Brain: There is no acute infarction or intracranial hemorrhage. There is no intracranial mass, mass effect, or edema. There is no hydrocephalus or extra-axial fluid collection. Ventricles and sulci are within normal limits in size and configuration. Patchy and confluent areas of T2 hyperintensity in the supratentorial and pontine white matter are nonspecific but probably reflect moderate to marked chronic microvascular ischemic changes. There are prominent perivascular spaces and likely some superimposed chronic small vessel infarcts of the central white matter and deep gray nuclei. Few scattered punctate foci of susceptibility likely reflect chronic microhemorrhages with a distribution suggesting hypertension as the underlying etiology. Vascular: Major vessel flow voids at the skull base are preserved. Skull and upper cervical spine: Normal marrow signal is preserved. Sinuses/Orbits: Mild mucosal thickening.  Orbits are unremarkable. Other: Sella is unremarkable.  Mastoid air cells are clear. IMPRESSION: No acute infarction, hemorrhage, or mass. Moderate to marked chronic microvascular ischemic changes. Mild burden of chronic microhemorrhages likely secondary to hypertension. Electronically Signed   By: Macy Mis M.D.   On: 04/20/2022 13:37   CT Head Wo Contrast  Result Date: 04/20/2022 CLINICAL DATA:  Neck trauma EXAM: CT HEAD WITHOUT CONTRAST CT  CERVICAL SPINE WITHOUT CONTRAST TECHNIQUE: Multidetector CT imaging of the head and cervical spine was performed following the standard protocol without intravenous contrast. Multiplanar CT image reconstructions of the cervical spine were also generated. RADIATION DOSE REDUCTION: This exam was performed according to the departmental dose-optimization program which includes automated exposure control, adjustment of the mA and/or kV according to patient size and/or use of iterative reconstruction technique. COMPARISON:  Head and cervical spine dated July 07, 2018 FINDINGS: CT HEAD FINDINGS Brain: Chronic white matter ischemic change. No evidence of acute infarction, hemorrhage, hydrocephalus, extra-axial collection or mass lesion/mass effect. Vascular: No hyperdense vessel or unexpected calcification. Skull: Normal. Negative for fracture or focal lesion. Sinuses/Orbits: No acute finding. Other: None. CT CERVICAL SPINE FINDINGS Alignment: Normal. Skull base and vertebrae: No acute fracture. No primary bone lesion or focal pathologic process. Soft tissues and spinal canal: No prevertebral fluid or swelling. No visible canal hematoma. Disc levels: Moderate multilevel degenerative disc disease, unchanged when compared to prior exam. Upper chest: Negative. Other: None. IMPRESSION: 1. No acute intracranial abnormality. 2. No evidence of acute cervical spine fracture or traumatic malalignment. Electronically Signed   By: Yetta Glassman M.D.   On: 04/20/2022 11:33   CT Cervical Spine Wo Contrast  Result Date: 04/20/2022 CLINICAL DATA:  Neck trauma EXAM: CT HEAD WITHOUT CONTRAST CT CERVICAL SPINE WITHOUT CONTRAST TECHNIQUE: Multidetector CT imaging of the head and cervical spine was performed following the standard protocol without intravenous contrast. Multiplanar CT image reconstructions of the cervical spine were also generated. RADIATION DOSE REDUCTION: This exam was performed according to the departmental  dose-optimization program which includes automated exposure control, adjustment of the mA and/or kV according to patient size and/or use of iterative reconstruction technique. COMPARISON:  Head and cervical spine dated July 07, 2018 FINDINGS:  CT HEAD FINDINGS Brain: Chronic white matter ischemic change. No evidence of acute infarction, hemorrhage, hydrocephalus, extra-axial collection or mass lesion/mass effect. Vascular: No hyperdense vessel or unexpected calcification. Skull: Normal. Negative for fracture or focal lesion. Sinuses/Orbits: No acute finding. Other: None. CT CERVICAL SPINE FINDINGS Alignment: Normal. Skull base and vertebrae: No acute fracture. No primary bone lesion or focal pathologic process. Soft tissues and spinal canal: No prevertebral fluid or swelling. No visible canal hematoma. Disc levels: Moderate multilevel degenerative disc disease, unchanged when compared to prior exam. Upper chest: Negative. Other: None. IMPRESSION: 1. No acute intracranial abnormality. 2. No evidence of acute cervical spine fracture or traumatic malalignment. Electronically Signed   By: Yetta Glassman M.D.   On: 04/20/2022 11:33   DG Pelvis Portable  Result Date: 04/20/2022 CLINICAL DATA:  Provided history: Fall. EXAM: PORTABLE PELVIS 1-2 VIEWS COMPARISON:  Radiographs of the pelvis 07/07/2018. FINDINGS: Internal rotation of the right femur. No appreciable acute fracture. Mild degenerative changes of the bilateral femoroacetabular joints. Lower lumbar spondylosis. IMPRESSION: Internal rotation of the right femur. No appreciable acute fracture on this single AP view radiograph. Degenerative changes of the bilateral femoroacetabular joints and lower lumbar spine. Electronically Signed   By: Kellie Simmering D.O.   On: 04/20/2022 10:58   DG Humerus Right  Result Date: 04/20/2022 CLINICAL DATA:  [id number] syncope EXAM: RIGHT HUMERUS - 2+ VIEW COMPARISON:  None Available. FINDINGS: Two views, 3 images of the right  humerus were obtained. There is no evidence of fracture or other focal bone lesions. Soft tissues are unremarkable. Mild AC joint degenerative/hypertrophic changes. IMPRESSION: No fracture of the humerus seen. Mild degenerative/hypertrophic changes at the [facility] joint. Electronically Signed   By: Frazier Richards M.D.   On: 04/20/2022 10:56   DG Chest Port 1 View  Result Date: 04/20/2022 CLINICAL DATA:  Provided history: Fall. EXAM: PORTABLE CHEST 1 VIEW COMPARISON:  Prior chest radiographs 11/15/2019 and earlier. Chest CT 11/15/2019. FINDINGS: Heart size within normal limits. No appreciable airspace consolidation or pulmonary edema. No evidence of pleural effusion or pneumothorax. No acute bony abnormality identified. Levocurvature of the thoracic spine, possibly positional. IMPRESSION: No evidence of acute cardiopulmonary abnormality. Electronically Signed   By: Kellie Simmering D.O.   On: 04/20/2022 10:54     Assessment and Plan:   Abnormal EKG  - Initial EKG showed sinus rhythm with PVCs and PACs, HR 59 BPM. Subsequent EKG showed a rhythm with narrow QRS complexes, a regular rhythm, HR 75BPM. Appears to be possible sawtooth pattern suspicious for atrial flutter in V1, aVR. However, there are clearly distinct P waves without flutter waves in leads II, III, aVF, V2-V6 - Telemetry showed predominantly sinus rhythm with periods of a simlar sawtooth pattern as seen on EKG.  - When I was in the room, I observed patient have a tremor in her right arm. When the tremor was present, the sawtooth pattern was apparent on telemetry. When the tremor resolved, telemetry returned to normal sinus rhythm.   Syncope  Weakness  - Echo ordered and pending  - Workup per primary, neurology  - Follow on telemetry for arrhythmias  - Consider monitor at discharge pending further workup   Otherwise per primary  - Rhabdomyolysis  - Chronic Pain  - Depression  - Bipolar 1 disorder  - Tardive dyskinesia     For questions or  updates, please contact [facility] Please consult [email or web address] for contact info under   Signed, Margie Billet, PA-C  04/21/2022 10:44  AM   Patient seen and examined, note reviewed with the signed Advanced Practice Provider. I personally reviewed laboratory data, imaging studies and relevant notes. I independently examined the patient and formulated the important aspects of the plan. I have personally discussed the plan with the patient and/or family. Comments or changes to the note/plan are indicated below.  Patient seen and examined at her bedside. She offered no complaints at this time. I was able to comprehensively review her tele which areas that proven to be artifact during my visit that patient with tremors. We can certainly continue to monitor the patient on tele.  For now there is no need to start anticoagulation as there is no proven atrial fibrillation. TTE in 2021 was unremarkable, agree with echo.  We will follow with you for now.   Berniece Salines DO, MS [facility] Attending Cardiologist [place]  [address] [place], [place] [address] [phone] Website: [email or web address]

## 2022-04-21 NOTE — ED Notes (Signed)
Ms. Santore is sitting up watching tv and using the telephone. Pt is alert and oriented. Moving her left arm and utilizing her right arm .

## 2022-04-21 NOTE — Evaluation (Signed)
Occupational Therapy Evaluation Patient Details Name: Judy [name] MRN: [id number] DOB: Sep 21, 1946 Today's Date: 04/21/2022   History of Present Illness 76 y.o. female with medical history significant of bipolar 1, depression, HTN, hx PE, hx tardive dyskinesia and multiple other medical problems admitted for syncope episode.  Found by next door neighbor.   Clinical Impression   Patient admitted for the diagnosis above.  PTA she lives at home alone with community mobility assist from family, meals on wheels, and a neighbor that frequently checks in on her.  Patient states she uses a 4WRW for mobility and sponge bathes at baseline.  The patient presents as flat with delayed responses.  She is needing up to Mod A for bed mobility and lower body ADL, with up to [place] for transfers at [facility] level.  Remaining deficits are listed below.  OT will follow in the acute setting, with SNF initially recommended for post acute rehab prior to home.  The patient may advance to home with [facility], if she is encouraged to sit up for meals, and mobilized in her room by staff, walking to the bathroom.        Recommendations for follow up therapy are one component of a multi-disciplinary discharge planning process, led by the attending physician.  Recommendations may be updated based on patient status, additional functional criteria and insurance authorization.   Follow Up Recommendations  Skilled nursing-short term rehab (<3 hours/day)    Assistance Recommended at Discharge Frequent or constant Supervision/Assistance  Patient can return home with the following Help with stairs or ramp for entrance;Assistance with cooking/housework;Assist for transportation;Direct supervision/assist for medications management;A lot of help with bathing/dressing/bathroom;A lot of help with walking and/or transfers    Functional Status Assessment  Patient has had a recent decline in their functional status and demonstrates the ability to  make significant improvements in function in a reasonable and predictable amount of time.  Equipment Recommendations  None recommended by OT    Recommendations for Other Services       Precautions / Restrictions Precautions Precautions: Fall Restrictions Weight Bearing Restrictions: No      Mobility Bed Mobility Overal bed mobility: Needs Assistance Bed Mobility: Supine to Sit     Supine to sit: Min assist, HOB elevated, Mod assist     General bed mobility comments: difficulty advancing hips to EOB    Transfers Overall transfer level: Needs assistance Equipment used: Rolling walker (2 wheels) Transfers: Sit to/from Stand, Bed to chair/wheelchair/BSC Sit to Stand: Min guard, Min assist     Step pivot transfers: Min guard, Min assist            Balance Overall balance assessment: Needs assistance Sitting-balance support: Feet supported Sitting balance-Leahy Scale: Good     Standing balance support: Reliant on assistive device for balance Standing balance-Leahy Scale: Poor                             ADL either performed or assessed with clinical judgement   ADL Overall ADL's : Needs assistance/impaired Eating/Feeding: Independent;Sitting   Grooming: Wash/dry hands;Wash/dry face;Set up;Sitting           Upper Body Dressing : Minimal assistance;Sitting   Lower Body Dressing: Moderate assistance;Sit to/from stand   Toilet Transfer: Minimal assistance;Stand-pivot;BSC/3in1;Rolling walker (2 wheels)                   Vision Patient Visual Report: No change from baseline  Perception Perception Perception: Within Functional Limits   Praxis Praxis Praxis: Intact    Pertinent Vitals/Pain Pain Assessment Pain Assessment: No/denies pain     Hand Dominance Right   Extremity/Trunk Assessment Upper Extremity Assessment Upper Extremity Assessment: Generalized weakness   Lower Extremity Assessment Lower Extremity  Assessment: Defer to PT evaluation   Cervical / Trunk Assessment Cervical / Trunk Assessment: Kyphotic   Communication Communication Communication: No difficulties   Cognition Arousal/Alertness: Awake/alert Behavior During Therapy: Flat affect   Area of Impairment: Orientation, Following commands, Problem solving                 Orientation Level: Person, Place, Time, Situation     Following Commands: Follows one step commands with increased time     Problem Solving: Slow processing General Comments: Unsure of baseline     General Comments   HR to 102 with mobility    Exercises     Shoulder Instructions      Home Living Family/patient expects to be discharged to:: Private residence Living Arrangements: Alone Available Help at Discharge: Family;Friend(s);Available PRN/intermittently Type of Home: Apartment Home Access: Level entry     Home Layout: One level     Bathroom Shower/Tub: Tub/shower unit;Sponge bathes at baseline   [employer]: Standard Bathroom Accessibility: Yes How Accessible: Accessible via walker Home Equipment: Rollator (4 wheels)          Prior Functioning/Environment Prior Level of Function : Needs assist               ADLs Comments: Family assists with community mobility and groceries.  She gets meals on wheels.  Completes light meal prep and sponge baths.        OT Problem List: Decreased strength;Decreased activity tolerance;Impaired balance (sitting and/or standing);Decreased cognition      OT Treatment/Interventions: Self-care/ADL training;Therapeutic activities;Patient/family education;DME and/or AE instruction;Balance training    OT Goals(Current goals can be found in the care plan section) Acute Rehab OT Goals Patient Stated Goal: Go back home OT Goal Formulation: With patient Time For Goal Achievement: 05/05/22 Potential to Achieve Goals: Fair ADL Goals Pt Will Perform Grooming: with  supervision;standing Pt Will Perform Lower Body Dressing: with min assist;sit to/from stand Pt Will Transfer to Toilet: with supervision;ambulating;regular height toilet Pt Will Perform Toileting - Clothing Manipulation and hygiene: with supervision;sitting/lateral leans;sit to/from stand  OT Frequency: Min 2X/week    Co-evaluation              AM-PAC OT "6 Clicks" Daily Activity     Outcome Measure Help from another person eating meals?: None Help from another person taking care of personal grooming?: None Help from another person toileting, which includes using toliet, bedpan, or urinal?: A Little Help from another person bathing (including washing, rinsing, drying)?: A Lot Help from another person to put on and taking off regular upper body clothing?: A Little Help from another person to put on and taking off regular lower body clothing?: A Lot 6 Click Score: 18   End of Session Equipment Utilized During Treatment: Rolling walker (2 wheels) Nurse Communication: Mobility status  Activity Tolerance: Patient tolerated treatment well Patient left: in chair;with call bell/phone within reach;with chair alarm set  OT Visit Diagnosis: Unsteadiness on feet (R26.81);Muscle weakness (generalized) (M62.81);History of falling (Z91.81)                Time: 1641-1700 OT Time Calculation (min): 19 min Charges:  OT General Charges $OT Visit: 1 Visit OT Evaluation $OT  Eval Moderate Complexity: 1 Mod  04/21/2022  RP, OTR/L  Acute Rehabilitation Services  Office:  [phone]   Metta Clines 04/21/2022, 5:20 PM

## 2022-04-21 NOTE — Assessment & Plan Note (Signed)
No evidence of arrhythmia, appreciate cards

## 2022-04-21 NOTE — Evaluation (Signed)
Clinical/Bedside Swallow Evaluation Patient Details  Name: Judy [name] MRN: [id number] Date of Birth: [birth year]  Today's Date: 04/21/2022 Time: SLP Start Time (ACUTE ONLY): 0734 SLP Stop Time (ACUTE ONLY): 0753 SLP Time Calculation (min) (ACUTE ONLY): 19 min  Past Medical History:  Past Medical History:  Diagnosis Date   Allergic rhinitis    Anemia    Anxiety    Arthritis    Bipolar 1 disorder (HCC)    Depression    Diverticulosis of colon (without mention of hemorrhage) 08/25/2011   [name]   Hearing loss in left ear    since birth   Hyperlipidemia    Hypertension    Obesity    Renal disorder    Vertigo    Past Surgical History:  Past Surgical History:  Procedure Laterality Date   COLONOSCOPY  08/25/2011   Procedure: COLONOSCOPY;  Surgeon: Landry Dyke, MD;  Location: [facility] ENDOSCOPY;  Service: Endoscopy;  Laterality: N/A;   DILATION AND CURETTAGE OF UTERUS  1960's   HPI:  Pt is a 76 yo female presenting s/p syncopal event and R sided weakness. CXR and MRI Brain without acute findings. Clinical swallow eval in August 2016 revealed mild lingual weakness and fasciculations and rapid, shallow respirations but no overt signs of aspiration and only mild oral dysphagia. Dys 3 diet and thin liquids recommended. PMH includes: tardive dyskinesia, bipolar 1, depression, HTN, hx PE, hearing loss L ear    Assessment / Plan / Recommendation  Clinical Impression  Pt denies a h/o dysphagia, but reports feeling like she is having a little more difficulty since admission. Multiple, audible swallows are noted across all consistencies but without any overt s/s of aspiration. She has the most effortful appearing swallow with straw sips of thin liquids, also reporting that it "feels like it's going into my head" (clarifying that maybe it feels like it is going up toward her nasal passages?). Subswallows appear to be reduced with cup sips and she subjectively feels better as well. Recommend  Dys 3 diet and thin liquids via cup with meds whole in puree. SLP will f/u to see if further instrumental assessment is indicated if symptoms persist. SLP Visit Diagnosis: Dysphagia, unspecified (R13.10)    Aspiration Risk  Mild aspiration risk    Diet Recommendation Dysphagia 3 (Mech soft);Thin liquid   Liquid Administration via: Cup;No straw Medication Administration: Whole meds with puree Supervision: Staff to assist with self feeding Compensations: Slow rate;Small sips/bites Postural Changes: Seated upright at 90 degrees    Other  Recommendations Oral Care Recommendations: Oral care BID    Recommendations for follow up therapy are one component of a multi-disciplinary discharge planning process, led by the attending physician.  Recommendations may be updated based on patient status, additional functional criteria and insurance authorization.  Follow up Recommendations  (tba)      Assistance Recommended at Discharge Intermittent Supervision/Assistance  Functional Status Assessment Patient has had a recent decline in their functional status and demonstrates the ability to make significant improvements in function in a reasonable and predictable amount of time.  Frequency and Duration min 2x/week  2 weeks       Prognosis Prognosis for Safe Diet Advancement: Good      Swallow Study   General HPI: Pt is a 76 yo female presenting s/p syncopal event and R sided weakness. CXR and MRI Brain without acute findings. Clinical swallow eval in August 2016 revealed mild lingual weakness and fasciculations and rapid, shallow respirations but no  overt signs of aspiration and only mild oral dysphagia. Dys 3 diet and thin liquids recommended. PMH includes: tardive dyskinesia, bipolar 1, depression, HTN, hx PE, hearing loss L ear Type of Study: Bedside Swallow Evaluation Previous Swallow Assessment: see HPI Diet Prior to this Study: Thin liquids (CLD) Temperature Spikes Noted: No Respiratory  Status: Room air History of Recent Intubation: No Behavior/Cognition: Alert;Cooperative;Pleasant mood Oral Cavity Assessment: Within Functional Limits Oral Care Completed by SLP: No Oral Cavity - Dentition: Adequate natural dentition Patient Positioning: Upright in bed Baseline Vocal Quality: Normal Volitional Cough: Weak Volitional Swallow: Able to elicit    Oral/Motor/Sensory Function Overall Oral Motor/Sensory Function: Generalized oral weakness   Ice Chips Ice chips: Not tested   Thin Liquid Thin Liquid: Impaired Pharyngeal  Phase Impairments: Multiple swallows    Nectar Thick Nectar Thick Liquid: Not tested   Honey Thick Honey Thick Liquid: Not tested   Puree Puree: Impaired Presentation: Spoon Pharyngeal Phase Impairments: Multiple swallows   Solid     Solid: Impaired Oral Phase Impairments: Impaired mastication Pharyngeal Phase Impairments: Multiple swallows      Osie Bond., M.A. [place] Office [phone]  Secure chat preferred  04/21/2022,8:51 AM

## 2022-04-21 NOTE — Procedures (Signed)
Patient Name: PARMINDER TRAPANI  MRN: [id number]  Epilepsy Attending: Lora Havens  Referring Physician/Provider: Elodia Florence., MD  Date: 04/20/2022 Duration: 23.32 mins  Patient history: 76 year old female with syncope.  EEG to evaluate for seizure.  Level of alertness: Awake, asleep  AEDs during EEG study: GBP  Technical aspects: This EEG study was done with scalp electrodes positioned according to the 10-20 International system of electrode placement. Electrical activity was acquired at a sampling rate of '500Hz'$  and reviewed with a high frequency filter of '70Hz'$  and a low frequency filter of '1Hz'$ . EEG data were recorded continuously and digitally stored.   Description: The posterior dominant rhythm consists of '8Hz'$  activity of moderate voltage (25-35 uV) seen predominantly in posterior head regions, symmetric and reactive to eye opening and eye closing. Sleep was characterized by vertex waves, sleep spindles (12 to 14 Hz), maximal frontocentral region. Hyperventilation and photic stimulation were not performed.     IMPRESSION: This study is within normal limits. No seizures or epileptiform discharges were seen throughout the recording.  Azaylia Fong Barbra Sarks

## 2022-04-21 NOTE — ED Notes (Signed)
Sitting up eating thin clear breakfast, Judy [name] is feeding herself.

## 2022-04-21 NOTE — Assessment & Plan Note (Addendum)
Postmenopausal bleeding Seems improved at this time Follow pelvic ultrasound -> fibroids Will need outpatient follow up

## 2022-04-21 NOTE — Hospital Course (Addendum)
[name] is Judy [name] 76 y.o. female with medical history significant of bipolar 1, depression, HTN, hx PE, hx tardive dyskinesia and multiple other medical problems who reportedly went to the bathroom yesterday morning and passed out.  She woke up this morning on the floor covered in fecal matter.  Reportedly she reported R sided weakness in the right side before she fell.     She reports she was going to the bathroom and then passed out.  She's unable to tell me whether she had R sided weakness before the LOC, but this was reported in the ED triage note.  She did not regain consciousness until this morning when she activated her medic alert button.  She was covered in feces and urine when whe was found.  She was able to walk Cordell Coke few steps with EMS, but continues to have right sided weakness.  She typically walks without assistance.  She denies fevers.  Notes chills.  Denies chest pain or SOB, denies nausea.     She was seen in the ED after being found down with LOC and R sided weakness.  MRI brain negative for acute infarction.  MRI c spine without findings to explain symptoms.  Neurology noted possibly compressive neuropathy after being down on R side overnight.  She was admitted and treated with IVF for elevated CK.  She had prominent R sided tremor that has improved since stopping ingrezza.  She's gradually improved.  MRI of L spine showed progressive multilevel lumbar spondylosis and severe canal stenosis with severe bilateral foraminal stenosis at L4-5 and moderate-severe right and mild L foraminal stenosis at L5-S1.  Plan for outpatient neurosurgery f/u.  See below for additional details

## 2022-04-21 NOTE — Progress Notes (Signed)
PT Cancellation Note  Patient Details Name: Judy [name] MRN: [id number] DOB: 05-10-1946   Cancelled Treatment:    Reason Eval/Treat Not Completed: Other (comment) Pt currently being transferred up to room. Will follow up as schedule allows.   Lou Miner, DPT  Acute Rehabilitation Services  Office: [phone]    Judy [name] 04/21/2022, 11:51 AM

## 2022-04-21 NOTE — Progress Notes (Signed)
Physical therapy transferred pt to chair and during transfer he noticed bright red pee in purewick canister.   Notified Danh, MD and placed pt back in bed.  Upon examination, pt has bright red blood coming from vaginal vestibule region. Pt. Denies any pain, and reports no hx of bleeding other than previous menstruation.   MD stated he will come to bedside and examine as well at this time.

## 2022-04-21 NOTE — Progress Notes (Signed)
PROGRESS NOTE    Judy [name]  GQQ:[id number] DOB: [birth year] DOA: 04/20/2022 PCP: [name], FNP  Chief Complaint  Patient presents with   Loss of Consciousness    Brief Narrative:  Judy [name] is Judy [name] 76 y.o. female with medical history significant of bipolar 1, depression, HTN, hx PE, hx tardive dyskinesia and multiple other medical problems who reportedly went to the bathroom yesterday morning and passed out.  She woke up this morning on the floor covered in fecal matter.  Reportedly she reported R sided weakness in the right side before she fell.     She reports she was going to the bathroom and then passed out.  She's unable to tell me whether she had R sided weakness before the LOC, but this was reported in the ED triage note.  She did not regain consciousness until this morning when she activated her medic alert button.  She was covered in feces and urine when whe was found.  She was able to walk Judy [name] few steps with EMS, but continues to have right sided weakness.  She typically walks without assistance.  She denies fevers.  Notes chills.  Denies chest pain or SOB, denies nausea.     ED Course: imaging.  MRI brain without stroke.  MRI spine pending.  Admit for R sided weakness, syncope    Assessment & Plan:   Principal Problem:   Syncope Active Problems:   Fall at home, initial encounter   Right sided weakness   Rhabdomyolysis   Abnormal EKG   Chronic pain   Vaginal bleeding   Depression   History of pulmonary embolism   Bipolar 1 disorder (HCC)   Tardive dyskinesia   Assessment and Plan: * Syncope Passed out, LOC reported from 7/10-11? Check orthostatics when able Echo with EF 67-12%, grade 1 diastolic dysfunction EEG without seizures or epileptiform discharges  Fall at home, initial encounter In setting of syncope Plain films of chest, pelvis, right humerus negative Head CT without acute intracranial abnormality C spine without acute intracranial  abnormality L shoulder plain films negative PT/OT Workup for syncope, right sided weakness ongoing  Right sided weakness Right sided weakness and tremor MRI brain without acute infarction, moderate to marked chronic microvascular ischemic changes, mild burden of chronic microhemorrhages MRI C spine with severe right and mild L foraminal stneosis with mild to moderate canal stenosis at C4-5.  Moderate right and mild L foraminal stenosis with mild canal stenosis at C5-6.  At c6-7 mild to moderate L>R foraminal stenosis.  At c3-4 mild bilateral foraminal stenosis and mild canal stenosis. Per discussion with neurology, possibly compressive neuropathy from being found down? Will discuss results with neuro and w/u additionally as indicated PT/OT  Abnormal EKG Appreciate cards assistance with telemetry review in setting of artifact from tremors  Rhabdomyolysis Elevated CK to >2000 Small Hb, but no RBC's noted Follow CK trend - improving IVF  Vaginal bleeding Noted today Trend Hb/Hct Follow pelvic ultrasound  Chronic pain Continue gabapentin  Depression Not taking wellbutrin anymore  History of pulmonary embolism Not currently on any anticoagulation This appears to have been diagnosed in 11/2019 was treated with eliquis at that time  Tardive dyskinesia ingrezza  Bipolar 1 disorder ([place]) noted     DVT prophylaxis: lovenox -> hold, SCD with vaginal bleeding Code Status: dnr Family Communication: none Disposition:   Status is: Inpatient Remains inpatient appropriate because: need for continued workup   Consultants:  cardiology  Procedures:  Echo IMPRESSIONS  1. Left ventricular ejection fraction, by estimation, is 60 to 65%. The  left ventricle has normal function. The left ventricle has no regional  wall motion abnormalities. There is moderate concentric left ventricular  hypertrophy. Left ventricular  diastolic parameters are consistent with Grade I  diastolic dysfunction  (impaired relaxation).   2. Right ventricular systolic function is normal. The right ventricular  size is normal. There is normal pulmonary artery systolic pressure. The  estimated right ventricular systolic pressure is 76.7 mmHg.   3. The mitral valve is normal in structure. Trivial mitral valve  regurgitation. No evidence of mitral stenosis.   4. The aortic valve is normal in structure. Aortic valve regurgitation is  not visualized. No aortic stenosis is present.   5. The inferior vena cava is normal in size with greater than 50%  respiratory variability, suggesting right atrial pressure of 3 mmHg.  Antimicrobials:  Anti-infectives (From admission, onward)    None       Subjective: No new complaints  Objective: Vitals:   04/21/22 1100 04/21/22 1130 04/21/22 1144 04/21/22 1543  BP: 140/69 140/78  125/65  Pulse: (!) 58 (!) 51  85  Resp: '18 17  15  '$ Temp:   97.8 F (36.6 C) 98.7 F (37.1 C)  TempSrc:   Oral Oral  SpO2: 98% 99%  100%  Weight:      Height:        Intake/Output Summary (Last 24 hours) at 04/21/2022 1924 Last data filed at 04/21/2022 1543 Gross per 24 hour  Intake --  Output 200 ml  Net -200 ml   Filed Weights   04/20/22 1153  Weight: 78 kg    Examination:  General exam: Appears calm and comfortable  Respiratory system: unlabored Cardiovascular system: RRR Central nervous system: R sided weakness, seems related to tremor Extremities: no LEE   Data Reviewed: I have personally reviewed following labs and imaging studies  CBC: Recent Labs  Lab 04/20/22 1137 04/20/22 1259 04/21/22 0413 04/21/22 1334  WBC 5.2 14.1* 8.5  --   NEUTROABS 4.4  --   --   --   HGB 4.7* 14.2 12.4 13.0  HCT 14.6* 41.4 37.4 41.0  MCV 96.7 89.2 91.9  --   PLT 79* 227 204  --     Basic Metabolic Panel: Recent Labs  Lab 04/20/22 1259 04/21/22 0413  NA 142 143  K 3.5 3.0*  CL 109 115*  CO2 20* 22  GLUCOSE 93 92  BUN 17 13  CREATININE  1.09* 1.00  CALCIUM 9.4 9.0    GFR: Estimated Creatinine Clearance: 51.6 mL/min (by C-G formula based on SCr of 1 mg/dL).  Liver Function Tests: Recent Labs  Lab 04/20/22 1259 04/21/22 0413  AST 77* 74*  ALT 25 25  ALKPHOS 51 45  BILITOT 0.8 0.7  PROT 6.2* 5.5*  ALBUMIN 3.6 3.0*    CBG: Recent Labs  Lab 04/20/22 1123  GLUCAP 108*     No results found for this or any previous visit (from the past 240 hour(s)).       Radiology Studies: ECHOCARDIOGRAM COMPLETE  Result Date: 04/21/2022    ECHOCARDIOGRAM REPORT   Patient Name:   MAKENZE ELLETT Date of Exam: 04/21/2022 Medical Rec #:  [id number]       Height:       69.0 in Accession #:    [id number]      Weight:       172.0 lb Date of Birth:  1945-12-26       BSA:          1.938 m Patient Age:    31 years        BP:           125/58 mmHg Patient Gender: F               HR:           80 bpm. Exam Location:  Inpatient Procedure: 2D Echo, Cardiac Doppler and Color Doppler Indications:    Syncope  History:        Patient has prior history of Echocardiogram examinations. Risk                 Factors:Hypertension.  Sonographer:    Jyl Heinz Referring Phys: [place]  1. Left ventricular ejection fraction, by estimation, is 60 to 65%. The left ventricle has normal function. The left ventricle has no regional wall motion abnormalities. There is moderate concentric left ventricular hypertrophy. Left ventricular diastolic parameters are consistent with Grade I diastolic dysfunction (impaired relaxation).  2. Right ventricular systolic function is normal. The right ventricular size is normal. There is normal pulmonary artery systolic pressure. The estimated right ventricular systolic pressure is 78.4 mmHg.  3. The mitral valve is normal in structure. Trivial mitral valve regurgitation. No evidence of mitral stenosis.  4. The aortic valve is normal in structure. Aortic valve regurgitation is not visualized. No  aortic stenosis is present.  5. The inferior vena cava is normal in size with greater than 50% respiratory variability, suggesting right atrial pressure of 3 mmHg. FINDINGS  Left Ventricle: Left ventricular ejection fraction, by estimation, is 60 to 65%. The left ventricle has normal function. The left ventricle has no regional wall motion abnormalities. The left ventricular internal cavity size was normal in size. There is  moderate concentric left ventricular hypertrophy. Left ventricular diastolic parameters are consistent with Grade I diastolic dysfunction (impaired relaxation). Normal left ventricular filling pressure. Right Ventricle: The right ventricular size is normal. No increase in right ventricular wall thickness. Right ventricular systolic function is normal. There is normal pulmonary artery systolic pressure. The tricuspid regurgitant velocity is 2.59 m/s, and  with an assumed right atrial pressure of 3 mmHg, the estimated right ventricular systolic pressure is 69.6 mmHg. Left Atrium: Left atrial size was normal in size. Right Atrium: Right atrial size was normal in size. Pericardium: Trivial pericardial effusion is present. The pericardial effusion is circumferential. Mitral Valve: The mitral valve is normal in structure. Trivial mitral valve regurgitation. No evidence of mitral valve stenosis. Tricuspid Valve: The tricuspid valve is normal in structure. Tricuspid valve regurgitation is trivial. No evidence of tricuspid stenosis. Aortic Valve: The aortic valve is normal in structure. Aortic valve regurgitation is not visualized. No aortic stenosis is present. Aortic valve peak gradient measures 5.8 mmHg. Pulmonic Valve: The pulmonic valve was normal in structure. Pulmonic valve regurgitation is not visualized. No evidence of pulmonic stenosis. Aorta: The aortic root is normal in size and structure. Venous: The inferior vena cava is normal in size with greater than 50% respiratory variability,  suggesting right atrial pressure of 3 mmHg. IAS/Shunts: No atrial level shunt detected by color flow Doppler.  LEFT VENTRICLE PLAX 2D LVIDd:         4.40 cm     Diastology LVIDs:         2.90 cm     LV e' medial:  5.00 cm/s LV PW:         1.40 cm     LV E/e' medial:  9.4 LV IVS:        1.40 cm     LV e' lateral:   7.72 cm/s LVOT diam:     2.00 cm     LV E/e' lateral: 6.1 LV SV:         57 LV SV Index:   29 LVOT Area:     3.14 cm  LV Volumes (MOD) LV vol d, MOD A2C: 71.7 ml LV vol d, MOD A4C: 93.5 ml LV vol s, MOD A2C: 29.8 ml LV vol s, MOD A4C: 34.1 ml LV SV MOD A2C:     41.9 ml LV SV MOD A4C:     93.5 ml LV SV MOD BP:      52.9 ml RIGHT VENTRICLE             IVC RV Basal diam:  2.80 cm     IVC diam: 1.40 cm RV Mid diam:    2.30 cm RV S prime:     26.10 cm/s TAPSE (M-mode): 1.9 cm LEFT ATRIUM             Index        RIGHT ATRIUM           Index LA diam:        4.20 cm 2.17 cm/m   RA Area:     13.30 cm LA Vol (A2C):   37.5 ml 19.35 ml/m  RA Volume:   25.60 ml  13.21 ml/m LA Vol (A4C):   59.9 ml 30.92 ml/m LA Biplane Vol: 49.6 ml 25.60 ml/m  AORTIC VALVE AV Area (Vmax): 2.26 cm AV Vmax:        120.00 cm/s AV Peak Grad:   5.8 mmHg LVOT Vmax:      86.30 cm/s LVOT Vmean:     64.400 cm/s LVOT VTI:       0.180 m  AORTA Ao Root diam: 2.70 cm Ao Asc diam:  2.90 cm MITRAL VALVE               TRICUSPID VALVE MV Area (PHT): 3.23 cm    TR Peak grad:   26.8 mmHg MV Decel Time: 235 msec    TR Vmax:        259.00 cm/s MV E velocity: 47.20 cm/s MV Elior Robinette velocity: 83.80 cm/s  SHUNTS MV E/Tawnya Pujol ratio:  0.56        Systemic VTI:  0.18 m                            Systemic Diam: 2.00 cm Fransico Him MD Electronically signed by Fransico Him MD Signature Date/Time: 04/21/2022/3:24:19 PM    Final    EEG adult  Result Date: 04/21/2022 Lora Havens, MD     04/21/2022  8:44 AM Patient Name: KASHAWNA MANZER MRN: [id number] Epilepsy Attending: Lora Havens Referring Physician/Provider: Elodia Florence., MD Date: 04/20/2022  Duration: 23.32 mins Patient history: 76 year old female with syncope.  EEG to evaluate for seizure. Level of alertness: Awake, asleep AEDs during EEG study: GBP Technical aspects: This EEG study was done with scalp electrodes positioned according to the 10-20 International system of electrode placement. Electrical activity was acquired at Manar Smalling sampling rate of '500Hz'$  and reviewed with Cera Rorke high frequency filter of '70Hz'$  and Hasel Janish low frequency filter of  $'1Hz'G$ . EEG data were recorded continuously and digitally stored. Description: The posterior dominant rhythm consists of '8Hz'$  activity of moderate voltage (25-35 uV) seen predominantly in posterior head regions, symmetric and reactive to eye opening and eye closing. Sleep was characterized by vertex waves, sleep spindles (12 to 14 Hz), maximal frontocentral region. Hyperventilation and photic stimulation were not performed.   IMPRESSION: This study is within normal limits. No seizures or epileptiform discharges were seen throughout the recording. Lora Havens   DG Shoulder Left  Result Date: 04/20/2022 CLINICAL DATA:  Syncopal episode.  Fall.  Shoulder pain. EXAM: LEFT SHOULDER - 2+ VIEW COMPARISON:  None Available. FINDINGS: There is no evidence of fracture or dislocation. Mild acromioclavicular degenerative change in small bony spur along the inferior aspect of the acromion. Mild glenohumeral degenerative change. IMPRESSION: No evidence of acute fracture or joint malalignment. Electronically Signed   By: Margaretha Sheffield M.D.   On: 04/20/2022 18:23   MR Cervical Spine Wo Contrast  Result Date: 04/20/2022 CLINICAL DATA:  Cervical radiculopathy, no red flags EXAM: MRI CERVICAL SPINE WITHOUT CONTRAST TECHNIQUE: Multiplanar, multisequence MR imaging of the cervical spine was performed. No intravenous contrast was administered. COMPARISON:  None Available. FINDINGS: Alignment: No substantial sagittal subluxation. Vertebrae: Vertebral body heights are maintained. No focal  marrow edema to suggest acute fracture or discitis/osteomyelitis. No suspicious bone lesion. Cord: Normal cord signal. Posterior Fossa, vertebral arteries, paraspinal tissues: Visualized vertebral artery flow voids are maintained. Unremarkable visualized posterior fossa. Disc levels: C2-C3: Small posterior disc osteophyte complex. No significant canal or foraminal stenosis. C3-C4: Small posterior disc osteophyte complex. Bilateral facet and uncovertebral hypertrophy. Mild bilateral foraminal stenosis. Mild canal stenosis. C4-C5: Posterior disc osteophyte complex with ligamentum flavum thickening and right greater than left facet and uncovertebral hypertrophy. Resulting severe right and mild left foraminal stenosis. Mild to moderate canal stenosis. C5-C6: Posterior disc osteophyte complex with right greater than left facet and uncovertebral hypertrophy. Resulting moderate right and mild left foraminal stenosis. Mild canal stenosis. C6-C7: Posterior disc osteophyte complex with bilateral facet and uncovertebral hypertrophy. Resulting mild-to-moderate left greater than right foraminal stenosis. Patent canal. C7-T1: No significant disc protrusion, foraminal stenosis, or canal stenosis. IMPRESSION: 1. At C4-C5, severe right and mild left foraminal stenosis with mild to moderate canal stenosis. 2. At C5-C6, moderate right and mild left foraminal stenosis with mild canal stenosis. 3. At C6-C7, mild-to-moderate left greater than right foraminal stenosis. 4. At C3-C4, mild bilateral foraminal stenosis and mild canal stenosis. Electronically Signed   By: Margaretha Sheffield M.D.   On: 04/20/2022 18:11   MR BRAIN WO CONTRAST  Result Date: 04/20/2022 CLINICAL DATA:  Neuro deficit, acute, stroke suspected EXAM: MRI HEAD WITHOUT CONTRAST TECHNIQUE: Multiplanar, multiecho pulse sequences of the brain and surrounding structures were obtained without intravenous contrast. COMPARISON:  2018 FINDINGS: Brain: There is no acute  infarction or intracranial hemorrhage. There is no intracranial mass, mass effect, or edema. There is no hydrocephalus or extra-axial fluid collection. Ventricles and sulci are within normal limits in size and configuration. Patchy and confluent areas of T2 hyperintensity in the supratentorial and pontine white matter are nonspecific but probably reflect moderate to marked chronic microvascular ischemic changes. There are prominent perivascular spaces and likely some superimposed chronic small vessel infarcts of the central white matter and deep gray nuclei. Few scattered punctate foci of susceptibility likely reflect chronic microhemorrhages with Taylen Wendland distribution suggesting hypertension as the underlying etiology. Vascular: Major vessel flow voids at the skull base are preserved. Skull  and upper cervical spine: Normal marrow signal is preserved. Sinuses/Orbits: Mild mucosal thickening.  Orbits are unremarkable. Other: Sella is unremarkable.  Mastoid air cells are clear. IMPRESSION: No acute infarction, hemorrhage, or mass. Moderate to marked chronic microvascular ischemic changes. Mild burden of chronic microhemorrhages likely secondary to hypertension. Electronically Signed   By: Macy Mis M.D.   On: 04/20/2022 13:37   CT Head Wo Contrast  Result Date: 04/20/2022 CLINICAL DATA:  Neck trauma EXAM: CT HEAD WITHOUT CONTRAST CT CERVICAL SPINE WITHOUT CONTRAST TECHNIQUE: Multidetector CT imaging of the head and cervical spine was performed following the standard protocol without intravenous contrast. Multiplanar CT image reconstructions of the cervical spine were also generated. RADIATION DOSE REDUCTION: This exam was performed according to the departmental dose-optimization program which includes automated exposure control, adjustment of the mA and/or kV according to patient size and/or use of iterative reconstruction technique. COMPARISON:  Head and cervical spine dated July 07, 2018 FINDINGS: CT HEAD  FINDINGS Brain: Chronic white matter ischemic change. No evidence of acute infarction, hemorrhage, hydrocephalus, extra-axial collection or mass lesion/mass effect. Vascular: No hyperdense vessel or unexpected calcification. Skull: Normal. Negative for fracture or focal lesion. Sinuses/Orbits: No acute finding. Other: None. CT CERVICAL SPINE FINDINGS Alignment: Normal. Skull base and vertebrae: No acute fracture. No primary bone lesion or focal pathologic process. Soft tissues and spinal canal: No prevertebral fluid or swelling. No visible canal hematoma. Disc levels: Moderate multilevel degenerative disc disease, unchanged when compared to prior exam. Upper chest: Negative. Other: None. IMPRESSION: 1. No acute intracranial abnormality. 2. No evidence of acute cervical spine fracture or traumatic malalignment. Electronically Signed   By: Yetta Glassman M.D.   On: 04/20/2022 11:33   CT Cervical Spine Wo Contrast  Result Date: 04/20/2022 CLINICAL DATA:  Neck trauma EXAM: CT HEAD WITHOUT CONTRAST CT CERVICAL SPINE WITHOUT CONTRAST TECHNIQUE: Multidetector CT imaging of the head and cervical spine was performed following the standard protocol without intravenous contrast. Multiplanar CT image reconstructions of the cervical spine were also generated. RADIATION DOSE REDUCTION: This exam was performed according to the departmental dose-optimization program which includes automated exposure control, adjustment of the mA and/or kV according to patient size and/or use of iterative reconstruction technique. COMPARISON:  Head and cervical spine dated July 07, 2018 FINDINGS: CT HEAD FINDINGS Brain: Chronic white matter ischemic change. No evidence of acute infarction, hemorrhage, hydrocephalus, extra-axial collection or mass lesion/mass effect. Vascular: No hyperdense vessel or unexpected calcification. Skull: Normal. Negative for fracture or focal lesion. Sinuses/Orbits: No acute finding. Other: None. CT CERVICAL  SPINE FINDINGS Alignment: Normal. Skull base and vertebrae: No acute fracture. No primary bone lesion or focal pathologic process. Soft tissues and spinal canal: No prevertebral fluid or swelling. No visible canal hematoma. Disc levels: Moderate multilevel degenerative disc disease, unchanged when compared to prior exam. Upper chest: Negative. Other: None. IMPRESSION: 1. No acute intracranial abnormality. 2. No evidence of acute cervical spine fracture or traumatic malalignment. Electronically Signed   By: Yetta Glassman M.D.   On: 04/20/2022 11:33   DG Pelvis Portable  Result Date: 04/20/2022 CLINICAL DATA:  Provided history: Fall. EXAM: PORTABLE PELVIS 1-2 VIEWS COMPARISON:  Radiographs of the pelvis 07/07/2018. FINDINGS: Internal rotation of the right femur. No appreciable acute fracture. Mild degenerative changes of the bilateral femoroacetabular joints. Lower lumbar spondylosis. IMPRESSION: Internal rotation of the right femur. No appreciable acute fracture on this single AP view radiograph. Degenerative changes of the bilateral femoroacetabular joints and lower lumbar spine. Electronically Signed  By: Kellie Simmering D.O.   On: 04/20/2022 10:58   DG Humerus Right  Result Date: 04/20/2022 CLINICAL DATA:  [id number] syncope EXAM: RIGHT HUMERUS - 2+ VIEW COMPARISON:  None Available. FINDINGS: Two views, 3 images of the right humerus were obtained. There is no evidence of fracture or other focal bone lesions. Soft tissues are unremarkable. Mild AC joint degenerative/hypertrophic changes. IMPRESSION: No fracture of the humerus seen. Mild degenerative/hypertrophic changes at the [facility] joint. Electronically Signed   By: Frazier Richards M.D.   On: 04/20/2022 10:56   DG Chest Port 1 View  Result Date: 04/20/2022 CLINICAL DATA:  Provided history: Fall. EXAM: PORTABLE CHEST 1 VIEW COMPARISON:  Prior chest radiographs 11/15/2019 and earlier. Chest CT 11/15/2019. FINDINGS: Heart size within normal limits. No appreciable  airspace consolidation or pulmonary edema. No evidence of pleural effusion or pneumothorax. No acute bony abnormality identified. Levocurvature of the thoracic spine, possibly positional. IMPRESSION: No evidence of acute cardiopulmonary abnormality. Electronically Signed   By: Kellie Simmering D.O.   On: 04/20/2022 10:54        Scheduled Meds:  gabapentin  400 mg Oral TID   valbenazine  80 mg Oral Daily   Continuous Infusions:  sodium chloride Stopped (04/21/22 0559)     LOS: 1 day    Time spent: over 30 min    Fayrene Helper, MD [facility]   To contact the attending provider between 7A-7P or the covering provider during after hours 7P-7A, please log into the web site [email or web address] and access using universal [place] password for that web site. If you do not have the password, please call the hospital operator.  04/21/2022, 7:24 PM

## 2022-04-22 DIAGNOSIS — R55 Syncope and collapse: Secondary | ICD-10-CM | POA: Diagnosis not present

## 2022-04-22 LAB — CBC WITH DIFFERENTIAL/PLATELET
Abs Immature Granulocytes: 0.01 10*3/uL (ref 0.00–0.07)
Basophils Absolute: 0 10*3/uL (ref 0.0–0.1)
Basophils Relative: 0 %
Eosinophils Absolute: 0.1 10*3/uL (ref 0.0–0.5)
Eosinophils Relative: 1 %
HCT: 35.1 % — ABNORMAL LOW (ref 36.0–46.0)
Hemoglobin: 11.9 g/dL — ABNORMAL LOW (ref 12.0–15.0)
Immature Granulocytes: 0 %
Lymphocytes Relative: 34 %
Lymphs Abs: 2.6 10*3/uL (ref 0.7–4.0)
MCH: 30.2 pg (ref 26.0–34.0)
MCHC: 33.9 g/dL (ref 30.0–36.0)
MCV: 89.1 fL (ref 80.0–100.0)
Monocytes Absolute: 0.7 10*3/uL (ref 0.1–1.0)
Monocytes Relative: 9 %
Neutro Abs: 4.1 10*3/uL (ref 1.7–7.7)
Neutrophils Relative %: 56 %
Platelets: 213 10*3/uL (ref 150–400)
RBC: 3.94 MIL/uL (ref 3.87–5.11)
RDW: 14.7 % (ref 11.5–15.5)
WBC: 7.5 10*3/uL (ref 4.0–10.5)
nRBC: 0 % (ref 0.0–0.2)

## 2022-04-22 LAB — CK: Total CK: 1500 U/L — ABNORMAL HIGH (ref 38–234)

## 2022-04-22 LAB — COMPREHENSIVE METABOLIC PANEL
ALT: 32 U/L (ref 0–44)
AST: 72 U/L — ABNORMAL HIGH (ref 15–41)
Albumin: 2.7 g/dL — ABNORMAL LOW (ref 3.5–5.0)
Alkaline Phosphatase: 42 U/L (ref 38–126)
Anion gap: 6 (ref 5–15)
BUN: 16 mg/dL (ref 8–23)
CO2: 25 mmol/L (ref 22–32)
Calcium: 8.9 mg/dL (ref 8.9–10.3)
Chloride: 113 mmol/L — ABNORMAL HIGH (ref 98–111)
Creatinine, Ser: 1.07 mg/dL — ABNORMAL HIGH (ref 0.44–1.00)
GFR, Estimated: 55 mL/min — ABNORMAL LOW (ref >60)
Glucose, Bld: 99 mg/dL (ref 70–99)
Potassium: 3.1 mmol/L — ABNORMAL LOW (ref 3.5–5.1)
Sodium: 144 mmol/L (ref 135–145)
Total Bilirubin: 0.4 mg/dL (ref 0.3–1.2)
Total Protein: 5.2 g/dL — ABNORMAL LOW (ref 6.5–8.1)

## 2022-04-22 LAB — SEDIMENTATION RATE: Sed Rate: 5 mm/hr (ref 0–22)

## 2022-04-22 LAB — C-REACTIVE PROTEIN: CRP: 0.6 mg/dL (ref <1.0)

## 2022-04-22 LAB — MAGNESIUM: Magnesium: 1.9 mg/dL (ref 1.7–2.4)

## 2022-04-22 LAB — PHOSPHORUS: Phosphorus: 2.9 mg/dL (ref 2.5–4.6)

## 2022-04-22 MED ORDER — POTASSIUM CHLORIDE CRYS ER 20 MEQ PO TBCR
40.0000 meq | EXTENDED_RELEASE_TABLET | ORAL | Status: AC
  Administered 2022-04-22 (×2): 40 meq via ORAL
  Filled 2022-04-22 (×2): qty 2

## 2022-04-22 NOTE — Evaluation (Signed)
Physical Therapy Evaluation Patient Details Name: Judy [name] MRN: [id number] DOB: [birth year] Today's Date: 04/22/2022  History of Present Illness  76 y.o. female with medical history significant of bipolar 1, depression, HTN, hx PE, hx tardive dyskinesia and multiple other medical problems admitted for syncope episode.  Found by next door neighbor.  Clinical Impression  PTA pt reports living alone in apartment with level entry. Pt reports using Rollator for mobility, able to do wash up independently and gets Meals on Wheels. Pt is currently limited in safe mobility by generalized weakness more profound on R side, and associated decreased balance and endurance. Pt is min A for bed mobility and stepping to recliner with RW. Pt unable to progress to ambulation due to weakness. PT recommends SNF level rehab at discharge. PT will continue to follow acutely.     Recommendations for follow up therapy are one component of a multi-disciplinary discharge planning process, led by the attending physician.  Recommendations may be updated based on patient status, additional functional criteria and insurance authorization.  Follow Up Recommendations Skilled nursing-short term rehab (<3 hours/day) Can patient physically be transported by private vehicle: Yes    Assistance Recommended at Discharge Frequent or constant Supervision/Assistance  Patient can return home with the following  A lot of help with walking and/or transfers;A lot of help with bathing/dressing/bathroom;Assistance with cooking/housework;Direct supervision/assist for medications management;Direct supervision/assist for financial management;Assist for transportation;Help with stairs or ramp for entrance    Equipment Recommendations BSC/3in1     Functional Status Assessment Patient has had a recent decline in their functional status and demonstrates the ability to make significant improvements in function in a reasonable and predictable  amount of time.     Precautions / Restrictions Precautions Precautions: Fall Restrictions Weight Bearing Restrictions: No      Mobility  Bed Mobility Overal bed mobility: Needs Assistance Bed Mobility: Supine to Sit     Supine to sit: Min assist, HOB elevated, Mod assist     General bed mobility comments: difficulty advancing hips to EOB    Transfers Overall transfer level: Needs assistance Equipment used: Rolling walker (2 wheels) Transfers: Sit to/from Stand, Bed to chair/wheelchair/BSC Sit to Stand: Min assist, From elevated surface   Step pivot transfers: Min assist       General transfer comment: min A for power up to standing, vc for hand placement, min A for steadying for stepping to chair, vc for not sitting before in front of seat    Ambulation/Gait               General Gait Details: unable to progress        Balance Overall balance assessment: Needs assistance Sitting-balance support: Feet supported Sitting balance-Leahy Scale: Good     Standing balance support: Reliant on assistive device for balance Standing balance-Leahy Scale: Poor                               Pertinent Vitals/Pain Pain Assessment Pain Assessment: No/denies pain    Home Living Family/patient expects to be discharged to:: Private residence Living Arrangements: Alone Available Help at Discharge: Family;Friend(s);Available PRN/intermittently Type of Home: Apartment Home Access: Level entry       Home Layout: One level Home Equipment: Rollator (4 wheels)      Prior Function Prior Level of Function : Needs assist               ADLs Comments:  Family assists with community mobility and groceries.  She gets meals on wheels.  Completes light meal prep and sponge baths.     Hand Dominance   Dominant Hand: Right    Extremity/Trunk Assessment   Upper Extremity Assessment Upper Extremity Assessment: Defer to OT evaluation    Lower  Extremity Assessment Lower Extremity Assessment: RLE deficits/detail;Generalized weakness RLE Deficits / Details: PROM WFL, hip flexor 3/5, knee flex 3/5, knee ext 3+/5 dorsiflexion 3/5, plantar flex 3+/5 RLE Sensation: decreased light touch RLE Coordination: decreased fine motor    Cervical / Trunk Assessment Cervical / Trunk Assessment: Kyphotic  Communication   Communication: No difficulties  Cognition Arousal/Alertness: Awake/alert Behavior During Therapy: Flat affect   Area of Impairment: Orientation, Following commands, Problem solving                 Orientation Level: Person, Place, Time, Situation     Following Commands: Follows one step commands with increased time     Problem Solving: Slow processing General Comments: Unsure of baseline        General Comments General comments (skin integrity, edema, etc.): pt with c/o dizziness with mobility, BP 146/81, SpO2 on RA >92%        Assessment/Plan    PT Assessment Patient needs continued PT services  PT Problem List Decreased strength;Decreased activity tolerance;Decreased balance;Decreased mobility;Decreased coordination;Decreased knowledge of use of DME;Decreased safety awareness       PT Treatment Interventions DME instruction;Gait training;Functional mobility training;Therapeutic activities;Therapeutic exercise;Balance training;Cognitive remediation;Patient/family education    PT Goals (Current goals can be found in the Care Plan section)  Acute Rehab PT Goals Patient Stated Goal: feel less weak PT Goal Formulation: With patient Time For Goal Achievement: 05/06/22 Potential to Achieve Goals: Fair    Frequency Min 2X/week        AM-PAC PT "6 Clicks" Mobility  Outcome Measure Help needed turning from your back to your side while in a flat bed without using bedrails?: None Help needed moving from lying on your back to sitting on the side of a flat bed without using bedrails?: A Lot Help needed  moving to and from a bed to a chair (including a wheelchair)?: A Little Help needed standing up from a chair using your arms (e.g., wheelchair or bedside chair)?: A Little Help needed to walk in hospital room?: A Lot Help needed climbing 3-5 steps with a railing? : Total 6 Click Score: 15    End of Session Equipment Utilized During Treatment: Gait belt Activity Tolerance: Patient limited by fatigue Patient left: in chair;with call bell/phone within reach;with chair alarm set Nurse Communication: Mobility status PT Visit Diagnosis: Unsteadiness on feet (R26.81);Other abnormalities of gait and mobility (R26.89);Muscle weakness (generalized) (M62.81);History of falling (Z91.81);Difficulty in walking, not elsewhere classified (R26.2);Dizziness and giddiness (R42)    Time: 6606-3016 PT Time Calculation (min) (ACUTE ONLY): 30 min   Charges:   PT Evaluation $PT Eval Moderate Complexity: 1 Mod PT Treatments $Therapeutic Activity: 8-22 mins        Damareon Lanni B. Migdalia Dk PT, DPT Acute Rehabilitation Services Please use secure chat or  Call Office [phone]   [place] 04/22/2022, 4:25 PM

## 2022-04-22 NOTE — Plan of Care (Signed)
  Problem: Clinical Measurements: Goal: Will remain free from infection Outcome: Progressing   Problem: Clinical Measurements: Goal: Diagnostic test results will improve Outcome: Progressing   Problem: Nutrition: Goal: Adequate nutrition will be maintained Outcome: Progressing   Problem: Activity: Goal: Risk for activity intolerance will decrease Outcome: Progressing   Problem: Safety: Goal: Ability to remain free from injury will improve Outcome: Progressing

## 2022-04-22 NOTE — Consult Note (Signed)
  Subjective:  Patient ID: Judy [name], female    DOB: [birth year],  MRN: [id number]  Chief Complaint  Patient presents with   Loss of Consciousness   76 y.o. female with past medical history significant of bipolar 1, depression, HTN, hx PE, hx tardive dyskinesia and multiple other medical problems presents with synope event and was found to have thickened toenails that are mycotic causing some discomfort. Patient is doing well at bedside. Limited communication.   Objective:   Vitals:   04/22/22 0307 04/22/22 0310  BP: (!) 157/68 119/62  Pulse: 73 (!) 46  Resp: 20 20  Temp: 98.2 F (36.8 C) 98.2 F (36.8 C)  SpO2:     Podiatric Exam: Vascular: dorsalis pedis and posterior tibial pulses are palpable bilateral. Capillary return is immediate. Temperature gradient is WNL. Skin turgor WNL  Sensorium: Normal Semmes Weinstein monofilament test. Normal tactile sensation bilaterally. Nail Exam: Pt has thick disfigured discolored nails with subungual debris noted bilateral entire nail hallux through fifth toenails.  Pain on palpation to the nails. Ulcer Exam: There is no evidence of ulcer or pre-ulcerative changes or infection. Orthopedic Exam: Muscle tone and strength are WNL. No limitations in general ROM. No crepitus or effusions noted.  Skin: No Porokeratosis. No infection or ulcers    Assessment & Plan:   1. Syncope, unspecified syncope type   2. Traumatic rhabdomyolysis, initial encounter [facility])     Patient was evaluated and treated and all questions answered.  Onychomycosis with pain  -Nails palliatively debrided as below. -Educated on self-care  Procedure: Nail Debridement Rationale: pain  Type of Debridement: manual, sharp debridement. Instrumentation: Nail nipper, rotary burr. Number of Nails: 10  Procedures and Treatment: Consent by patient was obtained for treatment procedures. The patient understood the discussion of treatment and procedures well. All questions  were answered thoroughly reviewed. Debridement of mycotic and hypertrophic toenails, 1 through 5 bilateral and clearing of subungual debris. No ulceration, no infection noted.  Return Visit-Office Procedure: Patient instructed to return to the office for a follow up visit 3 months for continued evaluation and treatment.  Podiatry to sign off. Please re-consult as needed.   Boneta Lucks, DPM    No follow-ups on file.

## 2022-04-22 NOTE — Progress Notes (Signed)
Progress Note  Patient Name: SOLENE HEREFORD Date of Encounter: 04/22/2022  Primary Cardiologist: None   Subjective   Patient seen examined her bedside.  She is awake when I arrived.  She offers no complaints at this time.  She is Broflex.  Inpatient Medications    Scheduled Meds:  gabapentin  400 mg Oral TID   valbenazine  80 mg Oral Daily   Continuous Infusions:  sodium chloride 150 mL/hr at 04/22/22 0814   PRN Meds: acetaminophen **OR** acetaminophen, albuterol, meclizine, polyethylene glycol   Vital Signs    Vitals:   04/22/22 0117 04/22/22 0307 04/22/22 0310 04/22/22 0807  BP:  (!) 157/68 119/62 (!) 154/70  Pulse: 69 73 (!) 46 (!) 56  Resp: '20 20 20 14  '$ Temp:  98.2 F (36.8 C) 98.2 F (36.8 C) 98.4 F (36.9 C)  TempSrc:  Oral Oral Oral  SpO2: 97%   94%  Weight: 81.8 kg     Height:        Intake/Output Summary (Last 24 hours) at 04/22/2022 1101 Last data filed at 04/22/2022 0545 Gross per 24 hour  Intake 2130.63 ml  Output 600 ml  Net 1530.63 ml   Filed Weights   04/20/22 1153 04/22/22 0117  Weight: 78 kg 81.8 kg    Telemetry    Sinus rhythm- Personally Reviewed  ECG    None- Personally Reviewed  Physical Exam    General: Comfortable Head: Atraumatic, normal size  Eyes: PEERLA, EOMI  Neck: Supple, normal JVD Cardiac: Normal S1, S2; RRR; no murmurs, rubs, or gallops Lungs: Clear to auscultation bilaterally Abd: Soft, nontender, no hepatomegaly  Ext: warm, no edema Musculoskeletal: No deformities, BUE and BLE strength normal and equal Skin: Warm and dry, no rashes   Neuro: Alert and oriented to person, place, time, and situation, CNII-XII grossly intact, no focal deficits  Psych: Normal mood and affect   Labs    Chemistry Recent Labs  Lab 04/20/22 1259 04/21/22 0413 04/22/22 0039  NA 142 143 144  K 3.5 3.0* 3.1*  CL 109 115* 113*  CO2 20* 22 25  GLUCOSE 93 92 99  BUN '17 13 16  '$ CREATININE 1.09* 1.00 1.07*  CALCIUM 9.4 9.0 8.9   PROT 6.2* 5.5* 5.2*  ALBUMIN 3.6 3.0* 2.7*  AST 77* 74* 72*  ALT 25 25 32  ALKPHOS 51 45 42  BILITOT 0.8 0.7 0.4  GFRNONAA 53* 59* 55*  ANIONGAP '13 6 6     '$ Hematology Recent Labs  Lab 04/20/22 1259 04/21/22 0413 04/21/22 1334 04/22/22 0039  WBC 14.1* 8.5  --  7.5  RBC 4.64 4.07  --  3.94  HGB 14.2 12.4 13.0 11.9*  HCT 41.4 37.4 41.0 35.1*  MCV 89.2 91.9  --  89.1  MCH 30.6 30.5  --  30.2  MCHC 34.3 33.2  --  33.9  RDW 14.4 14.9  --  14.7  PLT 227 204  --  213    Cardiac EnzymesNo results for input(s): "TROPONINI" in the last 168 hours. No results for input(s): "TROPIPOC" in the last 168 hours.   BNPNo results for input(s): "BNP", "PROBNP" in the last 168 hours.   DDimer No results for input(s): "DDIMER" in the last 168 hours.   Radiology    US PELVIS (TRANSABDOMINAL ONLY)  Result Date: 04/22/2022 CLINICAL DATA:  Vaginal bleeding EXAM: TRANSABDOMINAL ULTRASOUND OF PELVIS TECHNIQUE: Transabdominal ultrasound examination of the pelvis was performed including evaluation of the uterus, ovaries, adnexal regions,  and pelvic cul-de-sac. COMPARISON:  None Available. FINDINGS: Uterus Measurements: 12.3 x 6.1 x 7.0 cm = volume: 272 mL. 5.1 cm right fundal fibroid. 4.7 cm left fundal fibroid. Endometrium Thickness: 4 mm in thickness.  No focal abnormality visualized. Right ovary Measurements: Not visualized.  No adnexal mass seen. Left ovary Measurements: Not visualized.  No adnexal mass seen. Other findings: No abnormal free fluid. Patient refused transvaginal imaging. IMPRESSION: Uterine fibroids. No acute findings. Electronically Signed   By: Rolm Baptise M.D.   On: 04/22/2022 00:06   ECHOCARDIOGRAM COMPLETE  Result Date: 04/21/2022    ECHOCARDIOGRAM REPORT   Patient Name:   RAINAH KIRSHNER Date of Exam: 04/21/2022 Medical Rec #:  [id number]       Height:       69.0 in Accession #:    [id number]      Weight:       172.0 lb Date of Birth:  [birth year]       BSA:          1.938 m  Patient Age:    76 years        BP:           125/58 mmHg Patient Gender: F               HR:           80 bpm. Exam Location:  Inpatient Procedure: 2D Echo, Cardiac Doppler and Color Doppler Indications:    Syncope  History:        Patient has prior history of Echocardiogram examinations. Risk                 Factors:Hypertension.  Sonographer:    Jyl Heinz Referring Phys: [place]  1. Left ventricular ejection fraction, by estimation, is 60 to 65%. The left ventricle has normal function. The left ventricle has no regional wall motion abnormalities. There is moderate concentric left ventricular hypertrophy. Left ventricular diastolic parameters are consistent with Grade I diastolic dysfunction (impaired relaxation).  2. Right ventricular systolic function is normal. The right ventricular size is normal. There is normal pulmonary artery systolic pressure. The estimated right ventricular systolic pressure is 36.6 mmHg.  3. The mitral valve is normal in structure. Trivial mitral valve regurgitation. No evidence of mitral stenosis.  4. The aortic valve is normal in structure. Aortic valve regurgitation is not visualized. No aortic stenosis is present.  5. The inferior vena cava is normal in size with greater than 50% respiratory variability, suggesting right atrial pressure of 3 mmHg. FINDINGS  Left Ventricle: Left ventricular ejection fraction, by estimation, is 60 to 65%. The left ventricle has normal function. The left ventricle has no regional wall motion abnormalities. The left ventricular internal cavity size was normal in size. There is  moderate concentric left ventricular hypertrophy. Left ventricular diastolic parameters are consistent with Grade I diastolic dysfunction (impaired relaxation). Normal left ventricular filling pressure. Right Ventricle: The right ventricular size is normal. No increase in right ventricular wall thickness. Right ventricular systolic function is  normal. There is normal pulmonary artery systolic pressure. The tricuspid regurgitant velocity is 2.59 m/s, and  with an assumed right atrial pressure of 3 mmHg, the estimated right ventricular systolic pressure is 29.4 mmHg. Left Atrium: Left atrial size was normal in size. Right Atrium: Right atrial size was normal in size. Pericardium: Trivial pericardial effusion is present. The pericardial effusion is circumferential. Mitral Valve: The mitral valve is normal in  structure. Trivial mitral valve regurgitation. No evidence of mitral valve stenosis. Tricuspid Valve: The tricuspid valve is normal in structure. Tricuspid valve regurgitation is trivial. No evidence of tricuspid stenosis. Aortic Valve: The aortic valve is normal in structure. Aortic valve regurgitation is not visualized. No aortic stenosis is present. Aortic valve peak gradient measures 5.8 mmHg. Pulmonic Valve: The pulmonic valve was normal in structure. Pulmonic valve regurgitation is not visualized. No evidence of pulmonic stenosis. Aorta: The aortic root is normal in size and structure. Venous: The inferior vena cava is normal in size with greater than 50% respiratory variability, suggesting right atrial pressure of 3 mmHg. IAS/Shunts: No atrial level shunt detected by color flow Doppler.  LEFT VENTRICLE PLAX 2D LVIDd:         4.40 cm     Diastology LVIDs:         2.90 cm     LV e' medial:    5.00 cm/s LV PW:         1.40 cm     LV E/e' medial:  9.4 LV IVS:        1.40 cm     LV e' lateral:   7.72 cm/s LVOT diam:     2.00 cm     LV E/e' lateral: 6.1 LV SV:         57 LV SV Index:   29 LVOT Area:     3.14 cm  LV Volumes (MOD) LV vol d, MOD A2C: 71.7 ml LV vol d, MOD A4C: 93.5 ml LV vol s, MOD A2C: 29.8 ml LV vol s, MOD A4C: 34.1 ml LV SV MOD A2C:     41.9 ml LV SV MOD A4C:     93.5 ml LV SV MOD BP:      52.9 ml RIGHT VENTRICLE             IVC RV Basal diam:  2.80 cm     IVC diam: 1.40 cm RV Mid diam:    2.30 cm RV S prime:     26.10 cm/s TAPSE  (M-mode): 1.9 cm LEFT ATRIUM             Index        RIGHT ATRIUM           Index LA diam:        4.20 cm 2.17 cm/m   RA Area:     13.30 cm LA Vol (A2C):   37.5 ml 19.35 ml/m  RA Volume:   25.60 ml  13.21 ml/m LA Vol (A4C):   59.9 ml 30.92 ml/m LA Biplane Vol: 49.6 ml 25.60 ml/m  AORTIC VALVE AV Area (Vmax): 2.26 cm AV Vmax:        120.00 cm/s AV Peak Grad:   5.8 mmHg LVOT Vmax:      86.30 cm/s LVOT Vmean:     64.400 cm/s LVOT VTI:       0.180 m  AORTA Ao Root diam: 2.70 cm Ao Asc diam:  2.90 cm MITRAL VALVE               TRICUSPID VALVE MV Area (PHT): 3.23 cm    TR Peak grad:   26.8 mmHg MV Decel Time: 235 msec    TR Vmax:        259.00 cm/s MV E velocity: 47.20 cm/s MV A velocity: 83.80 cm/s  SHUNTS MV E/A ratio:  0.56        Systemic VTI:  0.18  m                            Systemic Diam: 2.00 cm Fransico Him MD Electronically signed by Fransico Him MD Signature Date/Time: 04/21/2022/3:24:19 PM    Final    EEG adult  Result Date: 04/21/2022 Lora Havens, MD     04/21/2022  8:44 AM Patient Name: MIKHALA KENAN MRN: [id number] Epilepsy Attending: Lora Havens Referring Physician/Provider: Elodia [place]., MD Date: 04/20/2022 Duration: 23.32 mins Patient history: 76 year old female with syncope.  EEG to evaluate for seizure. Level of alertness: Awake, asleep AEDs during EEG study: GBP Technical aspects: This EEG study was done with scalp electrodes positioned according to the 10-20 International system of electrode placement. Electrical activity was acquired at a sampling rate of '500Hz'$  and reviewed with a high frequency filter of '70Hz'$  and a low frequency filter of '1Hz'$ . EEG data were recorded continuously and digitally stored. Description: The posterior dominant rhythm consists of '8Hz'$  activity of moderate voltage (25-35 uV) seen predominantly in posterior head regions, symmetric and reactive to eye opening and eye closing. Sleep was characterized by vertex waves, sleep spindles (12 to 14 Hz),  maximal frontocentral region. Hyperventilation and photic stimulation were not performed.   IMPRESSION: This study is within normal limits. No seizures or epileptiform discharges were seen throughout the recording. Lora Havens   DG Shoulder Left  Result Date: 04/20/2022 CLINICAL DATA:  Syncopal episode.  Fall.  Shoulder pain. EXAM: LEFT SHOULDER - 2+ VIEW COMPARISON:  None Available. FINDINGS: There is no evidence of fracture or dislocation. Mild acromioclavicular degenerative change in small bony spur along the inferior aspect of the acromion. Mild glenohumeral degenerative change. IMPRESSION: No evidence of acute fracture or joint malalignment. Electronically Signed   By: Margaretha Sheffield M.D.   On: 04/20/2022 18:23   MR Cervical Spine Wo Contrast  Result Date: 04/20/2022 CLINICAL DATA:  Cervical radiculopathy, no red flags EXAM: MRI CERVICAL SPINE WITHOUT CONTRAST TECHNIQUE: Multiplanar, multisequence MR imaging of the cervical spine was performed. No intravenous contrast was administered. COMPARISON:  None Available. FINDINGS: Alignment: No substantial sagittal subluxation. Vertebrae: Vertebral body heights are maintained. No focal marrow edema to suggest acute fracture or discitis/osteomyelitis. No suspicious bone lesion. Cord: Normal cord signal. Posterior Fossa, vertebral arteries, paraspinal tissues: Visualized vertebral artery flow voids are maintained. Unremarkable visualized posterior fossa. Disc levels: C2-C3: Small posterior disc osteophyte complex. No significant canal or foraminal stenosis. C3-C4: Small posterior disc osteophyte complex. Bilateral facet and uncovertebral hypertrophy. Mild bilateral foraminal stenosis. Mild canal stenosis. C4-C5: Posterior disc osteophyte complex with ligamentum flavum thickening and right greater than left facet and uncovertebral hypertrophy. Resulting severe right and mild left foraminal stenosis. Mild to moderate canal stenosis. C5-C6: Posterior disc  osteophyte complex with right greater than left facet and uncovertebral hypertrophy. Resulting moderate right and mild left foraminal stenosis. Mild canal stenosis. C6-C7: Posterior disc osteophyte complex with bilateral facet and uncovertebral hypertrophy. Resulting mild-to-moderate left greater than right foraminal stenosis. Patent canal. C7-T1: No significant disc protrusion, foraminal stenosis, or canal stenosis. IMPRESSION: 1. At C4-C5, severe right and mild left foraminal stenosis with mild to moderate canal stenosis. 2. At C5-C6, moderate right and mild left foraminal stenosis with mild canal stenosis. 3. At C6-C7, mild-to-moderate left greater than right foraminal stenosis. 4. At C3-C4, mild bilateral foraminal stenosis and mild canal stenosis. Electronically Signed   By: Margaretha Sheffield M.D.   On:  04/20/2022 18:11   MR BRAIN WO CONTRAST  Result Date: 04/20/2022 CLINICAL DATA:  Neuro deficit, acute, stroke suspected EXAM: MRI HEAD WITHOUT CONTRAST TECHNIQUE: Multiplanar, multiecho pulse sequences of the brain and surrounding structures were obtained without intravenous contrast. COMPARISON:  2018 FINDINGS: Brain: There is no acute infarction or intracranial hemorrhage. There is no intracranial mass, mass effect, or edema. There is no hydrocephalus or extra-axial fluid collection. Ventricles and sulci are within normal limits in size and configuration. Patchy and confluent areas of T2 hyperintensity in the supratentorial and pontine white matter are nonspecific but probably reflect moderate to marked chronic microvascular ischemic changes. There are prominent perivascular spaces and likely some superimposed chronic small vessel infarcts of the central white matter and deep gray nuclei. Few scattered punctate foci of susceptibility likely reflect chronic microhemorrhages with a distribution suggesting hypertension as the underlying etiology. Vascular: Major vessel flow voids at the skull base are  preserved. Skull and upper cervical spine: Normal marrow signal is preserved. Sinuses/Orbits: Mild mucosal thickening.  Orbits are unremarkable. Other: Sella is unremarkable.  Mastoid air cells are clear. IMPRESSION: No acute infarction, hemorrhage, or mass. Moderate to marked chronic microvascular ischemic changes. Mild burden of chronic microhemorrhages likely secondary to hypertension. Electronically Signed   By: Macy Mis M.D.   On: 04/20/2022 13:37   CT Head Wo Contrast  Result Date: 04/20/2022 CLINICAL DATA:  Neck trauma EXAM: CT HEAD WITHOUT CONTRAST CT CERVICAL SPINE WITHOUT CONTRAST TECHNIQUE: Multidetector CT imaging of the head and cervical spine was performed following the standard protocol without intravenous contrast. Multiplanar CT image reconstructions of the cervical spine were also generated. RADIATION DOSE REDUCTION: This exam was performed according to the departmental dose-optimization program which includes automated exposure control, adjustment of the mA and/or kV according to patient size and/or use of iterative reconstruction technique. COMPARISON:  Head and cervical spine dated July 07, 2018 FINDINGS: CT HEAD FINDINGS Brain: Chronic white matter ischemic change. No evidence of acute infarction, hemorrhage, hydrocephalus, extra-axial collection or mass lesion/mass effect. Vascular: No hyperdense vessel or unexpected calcification. Skull: Normal. Negative for fracture or focal lesion. Sinuses/Orbits: No acute finding. Other: None. CT CERVICAL SPINE FINDINGS Alignment: Normal. Skull base and vertebrae: No acute fracture. No primary bone lesion or focal pathologic process. Soft tissues and spinal canal: No prevertebral fluid or swelling. No visible canal hematoma. Disc levels: Moderate multilevel degenerative disc disease, unchanged when compared to prior exam. Upper chest: Negative. Other: None. IMPRESSION: 1. No acute intracranial abnormality. 2. No evidence of acute cervical  spine fracture or traumatic malalignment. Electronically Signed   By: Yetta Glassman M.D.   On: 04/20/2022 11:33   CT Cervical Spine Wo Contrast  Result Date: 04/20/2022 CLINICAL DATA:  Neck trauma EXAM: CT HEAD WITHOUT CONTRAST CT CERVICAL SPINE WITHOUT CONTRAST TECHNIQUE: Multidetector CT imaging of the head and cervical spine was performed following the standard protocol without intravenous contrast. Multiplanar CT image reconstructions of the cervical spine were also generated. RADIATION DOSE REDUCTION: This exam was performed according to the departmental dose-optimization program which includes automated exposure control, adjustment of the mA and/or kV according to patient size and/or use of iterative reconstruction technique. COMPARISON:  Head and cervical spine dated July 07, 2018 FINDINGS: CT HEAD FINDINGS Brain: Chronic white matter ischemic change. No evidence of acute infarction, hemorrhage, hydrocephalus, extra-axial collection or mass lesion/mass effect. Vascular: No hyperdense vessel or unexpected calcification. Skull: Normal. Negative for fracture or focal lesion. Sinuses/Orbits: No acute finding. Other: None. CT CERVICAL  SPINE FINDINGS Alignment: Normal. Skull base and vertebrae: No acute fracture. No primary bone lesion or focal pathologic process. Soft tissues and spinal canal: No prevertebral fluid or swelling. No visible canal hematoma. Disc levels: Moderate multilevel degenerative disc disease, unchanged when compared to prior exam. Upper chest: Negative. Other: None. IMPRESSION: 1. No acute intracranial abnormality. 2. No evidence of acute cervical spine fracture or traumatic malalignment. Electronically Signed   By: Yetta Glassman M.D.   On: 04/20/2022 11:33    Cardiac Studies   TTE 04/21/2022 IMPRESSIONS     1. Left ventricular ejection fraction, by estimation, is 60 to 65%. The  left ventricle has normal function. The left ventricle has no regional  wall motion  abnormalities. There is moderate concentric left ventricular  hypertrophy. Left ventricular  diastolic parameters are consistent with Grade I diastolic dysfunction  (impaired relaxation).   2. Right ventricular systolic function is normal. The right ventricular  size is normal. There is normal pulmonary artery systolic pressure. The  estimated right ventricular systolic pressure is 03.5 mmHg.   3. The mitral valve is normal in structure. Trivial mitral valve  regurgitation. No evidence of mitral stenosis.   4. The aortic valve is normal in structure. Aortic valve regurgitation is  not visualized. No aortic stenosis is present.   5. The inferior vena cava is normal in size with greater than 50%  respiratory variability, suggesting right atrial pressure of 3 mmHg.   FINDINGS   Left Ventricle: Left ventricular ejection fraction, by estimation, is 60  to 65%. The left ventricle has normal function. The left ventricle has no  regional wall motion abnormalities. The left ventricular internal cavity  size was normal in size. There is   moderate concentric left ventricular hypertrophy. Left ventricular  diastolic parameters are consistent with Grade I diastolic dysfunction  (impaired relaxation). Normal left ventricular filling pressure.   Right Ventricle: The right ventricular size is normal. No increase in  right ventricular wall thickness. Right ventricular systolic function is  normal. There is normal pulmonary artery systolic pressure. The tricuspid  regurgitant velocity is 2.59 m/s, and   with an assumed right atrial pressure of 3 mmHg, the estimated right  ventricular systolic pressure is 46.5 mmHg.   Left Atrium: Left atrial size was normal in size.   Right Atrium: Right atrial size was normal in size.   Pericardium: Trivial pericardial effusion is present. The pericardial  effusion is circumferential.   Mitral Valve: The mitral valve is normal in structure. Trivial mitral   valve regurgitation. No evidence of mitral valve stenosis.   Tricuspid Valve: The tricuspid valve is normal in structure. Tricuspid  valve regurgitation is trivial. No evidence of tricuspid stenosis.   Aortic Valve: The aortic valve is normal in structure. Aortic valve  regurgitation is not visualized. No aortic stenosis is present. Aortic  valve peak gradient measures 5.8 mmHg.   Pulmonic Valve: The pulmonic valve was normal in structure. Pulmonic valve  regurgitation is not visualized. No evidence of pulmonic stenosis.   Aorta: The aortic root is normal in size and structure.   Venous: The inferior vena cava is normal in size with greater than 50%  respiratory variability, suggesting right atrial pressure of 3 mmHg.   IAS/Shunts: No atrial level shunt detected by color flow Doppler.   Patient Profile     76 y.o. female who presents with syncope.  Assessment & Plan    Syncope-echocardiogram within normal limits.  Monitor no evidence of any  arrhythmia.  Follow-up orthostatics.  Internal medicine.  Rhabdomyolysis-this being managed by the primary team.  Tardive dyskinesia Bipolar disorder  [facility] will sign off.   Medication Recommendations: No new medicines from cardiology Other recommendations (labs, testing, etc): None Follow up as an outpatient: May benefit from routine follow-up.  For questions or updates, please contact [place] Please consult [email or web address] for contact info under Cardiology/STEMI.      Signed, Geneveive Furness, DO  04/22/2022, 11:01 AM

## 2022-04-22 NOTE — Progress Notes (Signed)
PROGRESS NOTE    Judy [name]  XBM:[id number] DOB: Dec 17, 1945 DOA: 04/20/2022 PCP: Judy Brine, FNP  Chief Complaint  Patient presents with   Loss of Consciousness    Brief Narrative:  Judy [name] is Judy [name] 76 y.o. female with medical history significant of bipolar 1, depression, HTN, hx PE, hx tardive dyskinesia and multiple other medical problems who reportedly went to the bathroom yesterday morning and passed out.  She woke up this morning on the floor covered in fecal matter.  Reportedly she reported R sided weakness in the right side before she fell.     She reports she was going to the bathroom and then passed out.  She's unable to tell me whether she had R sided weakness before the LOC, but this was reported in the ED triage note.  She did not regain consciousness until this morning when she activated her medic alert button.  She was covered in feces and urine when whe was found.  She was able to walk Judy [name] few steps with EMS, but continues to have right sided weakness.  She typically walks without assistance.  She denies fevers.  Notes chills.  Denies chest pain or SOB, denies nausea.     ED Course: imaging.  MRI brain without stroke.  MRI spine pending.  Admit for R sided weakness, syncope    Assessment & Plan:   Principal Problem:   Syncope Active Problems:   Fall at home, initial encounter   Right sided weakness   Rhabdomyolysis   Abnormal EKG   Chronic pain   Vaginal bleeding   Depression   History of pulmonary embolism   Bipolar 1 disorder (HCC)   Tardive dyskinesia   Assessment and Plan: * Syncope Passed out, LOC reported from 7/10-11? Check orthostatics when able, pending Echo with EF 02-72%, grade 1 diastolic dysfunction EEG without seizures or epileptiform discharges  Fall at home, initial encounter In setting of syncope Plain films of chest, pelvis, right humerus negative Head CT without acute intracranial abnormality C spine without acute intracranial  abnormality L shoulder plain films negative PT/OT Workup for syncope, right sided weakness ongoing  Right sided weakness Right sided weakness and tremor MRI brain without acute infarction, moderate to marked chronic microvascular ischemic changes, mild burden of chronic microhemorrhages MRI C spine with severe right and mild L foraminal stneosis with mild to moderate canal stenosis at C4-5.  Moderate right and mild L foraminal stenosis with mild canal stenosis at C5-6.  At c6-7 mild to moderate L>R foraminal stenosis.  At c3-4 mild bilateral foraminal stenosis and mild canal stenosis. Per discussion with neurology, possibly compressive neuropathy from being found down? PT/OT  Abnormal EKG No evidence of arrhythmia, appreciate cards   Rhabdomyolysis Elevated CK to >2000 Small Hb, but no RBC's noted Follow CK trend - improving IVF, continue  Vaginal bleeding Postmenopausal bleeding Seems improved today Follow pelvic ultrasound -> fibroids Will need outpatient follow up  Chronic pain Continue gabapentin  Depression Not taking wellbutrin anymore  History of pulmonary embolism Not currently on any anticoagulation This appears to have been diagnosed in 11/2019 was treated with eliquis at that time  Tardive dyskinesia ingrezza on hold -> tremor has improved with this -> needs to follow with neurology or psych outpatient  Bipolar 1 disorder ([place]) noted     DVT prophylaxis: lovenox -> hold, SCD with vaginal bleeding Code Status: dnr Family Communication: none Disposition:   Status is: Inpatient Remains inpatient appropriate because: need for  continued workup   Consultants:  cardiology  Procedures:  Echo IMPRESSIONS     1. Left ventricular ejection fraction, by estimation, is 60 to 65%. The  left ventricle has normal function. The left ventricle has no regional  wall motion abnormalities. There is moderate concentric left ventricular  hypertrophy. Left  ventricular  diastolic parameters are consistent with Grade I diastolic dysfunction  (impaired relaxation).   2. Right ventricular systolic function is normal. The right ventricular  size is normal. There is normal pulmonary artery systolic pressure. The  estimated right ventricular systolic pressure is 02.6 mmHg.   3. The mitral valve is normal in structure. Trivial mitral valve  regurgitation. No evidence of mitral stenosis.   4. The aortic valve is normal in structure. Aortic valve regurgitation is  not visualized. No aortic stenosis is present.   5. The inferior vena cava is normal in size with greater than 50%  respiratory variability, suggesting right atrial pressure of 3 mmHg.  Antimicrobials:  Anti-infectives (From admission, onward)    None       Subjective: Feels better off ingrezza  Objective: Vitals:   04/22/22 0307 04/22/22 0310 04/22/22 0807 04/22/22 1207  BP: (!) 157/68 119/62 (!) 154/70 137/78  Pulse: 73 (!) 46 (!) 56 (!) 59  Resp: '20 20 14 20  '$ Temp: 98.2 F (36.8 C) 98.2 F (36.8 C) 98.4 F (36.9 C) 98.1 F (36.7 C)  TempSrc: Oral Oral Oral Oral  SpO2:   94% 96%  Weight:      Height:        Intake/Output Summary (Last 24 hours) at 04/22/2022 1702 Last data filed at 04/22/2022 1522 Gross per 24 hour  Intake 3562.3 ml  Output 1500 ml  Net 2062.3 ml   Filed Weights   04/20/22 1153 04/22/22 0117  Weight: 78 kg 81.8 kg    Examination:  General: No acute distress. Cardiovascular: RRR Lungs: unlabored Neurological: R sided weakness persistent, but tremor is less today off ingrezza Extremities: No clubbing or cyanosis. No edema.   Data Reviewed: I have personally reviewed following labs and imaging studies  CBC: Recent Labs  Lab 04/20/22 1137 04/20/22 1259 04/21/22 0413 04/21/22 1334 04/22/22 0039  WBC 5.2 14.1* 8.5  --  7.5  NEUTROABS 4.4  --   --   --  4.1  HGB 4.7* 14.2 12.4 13.0 11.9*  HCT 14.6* 41.4 37.4 41.0 35.1*  MCV 96.7 89.2  91.9  --  89.1  PLT 79* 227 204  --  378    Basic Metabolic Panel: Recent Labs  Lab 04/20/22 1259 04/21/22 0413 04/22/22 0039  NA 142 143 144  K 3.5 3.0* 3.1*  CL 109 115* 113*  CO2 20* 22 25  GLUCOSE 93 92 99  BUN '17 13 16  '$ CREATININE 1.09* 1.00 1.07*  CALCIUM 9.4 9.0 8.9  MG  --   --  1.9  PHOS  --   --  2.9    GFR: Estimated Creatinine Clearance: 52.7 mL/min (Zyia Kaneko) (by C-G formula based on SCr of 1.07 mg/dL (H)).  Liver Function Tests: Recent Labs  Lab 04/20/22 1259 04/21/22 0413 04/22/22 0039  AST 77* 74* 72*  ALT 25 25 32  ALKPHOS 51 45 42  BILITOT 0.8 0.7 0.4  PROT 6.2* 5.5* 5.2*  ALBUMIN 3.6 3.0* 2.7*    CBG: Recent Labs  Lab 04/20/22 1123  GLUCAP 108*     No results found for this or any previous visit (from the past  240 hour(s)).       Radiology Studies: US PELVIS (TRANSABDOMINAL ONLY)  Result Date: 04/22/2022 CLINICAL DATA:  Vaginal bleeding EXAM: TRANSABDOMINAL ULTRASOUND OF PELVIS TECHNIQUE: Transabdominal ultrasound examination of the pelvis was performed including evaluation of the uterus, ovaries, adnexal regions, and pelvic cul-de-sac. COMPARISON:  None Available. FINDINGS: Uterus Measurements: 12.3 x 6.1 x 7.0 cm = volume: 272 mL. 5.1 cm right fundal fibroid. 4.7 cm left fundal fibroid. Endometrium Thickness: 4 mm in thickness.  No focal abnormality visualized. Right ovary Measurements: Not visualized.  No adnexal mass seen. Left ovary Measurements: Not visualized.  No adnexal mass seen. Other findings: No abnormal free fluid. Patient refused transvaginal imaging. IMPRESSION: Uterine fibroids. No acute findings. Electronically Signed   By: Rolm Baptise M.D.   On: 04/22/2022 00:06   ECHOCARDIOGRAM COMPLETE  Result Date: 04/21/2022    ECHOCARDIOGRAM REPORT   Patient Name:   KENSLEY VALLADARES Date of Exam: 04/21/2022 Medical Rec #:  [id number]       Height:       69.0 in Accession #:    [id number]      Weight:       172.0 lb Date of Birth:  [birth year]        BSA:          1.938 m Patient Age:    25 years        BP:           125/58 mmHg Patient Gender: F               HR:           80 bpm. Exam Location:  Inpatient Procedure: 2D Echo, Cardiac Doppler and Color Doppler Indications:    Syncope  History:        Patient has prior history of Echocardiogram examinations. Risk                 Factors:Hypertension.  Sonographer:    Jyl Heinz Referring Phys: [place]  1. Left ventricular ejection fraction, by estimation, is 60 to 65%. The left ventricle has normal function. The left ventricle has no regional wall motion abnormalities. There is moderate concentric left ventricular hypertrophy. Left ventricular diastolic parameters are consistent with Grade I diastolic dysfunction (impaired relaxation).  2. Right ventricular systolic function is normal. The right ventricular size is normal. There is normal pulmonary artery systolic pressure. The estimated right ventricular systolic pressure is 62.2 mmHg.  3. The mitral valve is normal in structure. Trivial mitral valve regurgitation. No evidence of mitral stenosis.  4. The aortic valve is normal in structure. Aortic valve regurgitation is not visualized. No aortic stenosis is present.  5. The inferior vena cava is normal in size with greater than 50% respiratory variability, suggesting right atrial pressure of 3 mmHg. FINDINGS  Left Ventricle: Left ventricular ejection fraction, by estimation, is 60 to 65%. The left ventricle has normal function. The left ventricle has no regional wall motion abnormalities. The left ventricular internal cavity size was normal in size. There is  moderate concentric left ventricular hypertrophy. Left ventricular diastolic parameters are consistent with Grade I diastolic dysfunction (impaired relaxation). Normal left ventricular filling pressure. Right Ventricle: The right ventricular size is normal. No increase in right ventricular wall thickness. Right  ventricular systolic function is normal. There is normal pulmonary artery systolic pressure. The tricuspid regurgitant velocity is 2.59 m/s, and  with an assumed right atrial pressure of 3 mmHg, the  estimated right ventricular systolic pressure is 42.6 mmHg. Left Atrium: Left atrial size was normal in size. Right Atrium: Right atrial size was normal in size. Pericardium: Trivial pericardial effusion is present. The pericardial effusion is circumferential. Mitral Valve: The mitral valve is normal in structure. Trivial mitral valve regurgitation. No evidence of mitral valve stenosis. Tricuspid Valve: The tricuspid valve is normal in structure. Tricuspid valve regurgitation is trivial. No evidence of tricuspid stenosis. Aortic Valve: The aortic valve is normal in structure. Aortic valve regurgitation is not visualized. No aortic stenosis is present. Aortic valve peak gradient measures 5.8 mmHg. Pulmonic Valve: The pulmonic valve was normal in structure. Pulmonic valve regurgitation is not visualized. No evidence of pulmonic stenosis. Aorta: The aortic root is normal in size and structure. Venous: The inferior vena cava is normal in size with greater than 50% respiratory variability, suggesting right atrial pressure of 3 mmHg. IAS/Shunts: No atrial level shunt detected by color flow Doppler.  LEFT VENTRICLE PLAX 2D LVIDd:         4.40 cm     Diastology LVIDs:         2.90 cm     LV e' medial:    5.00 cm/s LV PW:         1.40 cm     LV E/e' medial:  9.4 LV IVS:        1.40 cm     LV e' lateral:   7.72 cm/s LVOT diam:     2.00 cm     LV E/e' lateral: 6.1 LV SV:         57 LV SV Index:   29 LVOT Area:     3.14 cm  LV Volumes (MOD) LV vol d, MOD A2C: 71.7 ml LV vol d, MOD A4C: 93.5 ml LV vol s, MOD A2C: 29.8 ml LV vol s, MOD A4C: 34.1 ml LV SV MOD A2C:     41.9 ml LV SV MOD A4C:     93.5 ml LV SV MOD BP:      52.9 ml RIGHT VENTRICLE             IVC RV Basal diam:  2.80 cm     IVC diam: 1.40 cm RV Mid diam:    2.30 cm RV S  prime:     26.10 cm/s TAPSE (M-mode): 1.9 cm LEFT ATRIUM             Index        RIGHT ATRIUM           Index LA diam:        4.20 cm 2.17 cm/m   RA Area:     13.30 cm LA Vol (A2C):   37.5 ml 19.35 ml/m  RA Volume:   25.60 ml  13.21 ml/m LA Vol (A4C):   59.9 ml 30.92 ml/m LA Biplane Vol: 49.6 ml 25.60 ml/m  AORTIC VALVE AV Area (Vmax): 2.26 cm AV Vmax:        120.00 cm/s AV Peak Grad:   5.8 mmHg LVOT Vmax:      86.30 cm/s LVOT Vmean:     64.400 cm/s LVOT VTI:       0.180 m  AORTA Ao Root diam: 2.70 cm Ao Asc diam:  2.90 cm MITRAL VALVE               TRICUSPID VALVE MV Area (PHT): 3.23 cm    TR Peak grad:   26.8 mmHg MV Decel  Time: 235 msec    TR Vmax:        259.00 cm/s MV E velocity: 47.20 cm/s MV Jordyne Poehlman velocity: 83.80 cm/s  SHUNTS MV E/Viren Lebeau ratio:  0.56        Systemic VTI:  0.18 m                            Systemic Diam: 2.00 cm Fransico Him MD Electronically signed by Fransico Him MD Signature Date/Time: 04/21/2022/3:24:19 PM    Final    EEG adult  Result Date: 04/21/2022 Lora Havens, MD     04/21/2022  8:44 AM Patient Name: RIO TABER MRN: [id number] Epilepsy Attending: Lora Havens Referring Physician/Provider: Elodia Florence., MD Date: 04/20/2022 Duration: 23.32 mins Patient history: 76 year old female with syncope.  EEG to evaluate for seizure. Level of alertness: Awake, asleep AEDs during EEG study: GBP Technical aspects: This EEG study was done with scalp electrodes positioned according to the 10-20 International system of electrode placement. Electrical activity was acquired at [place] sampling rate of '500Hz'$  and reviewed with Tonie Elsey high frequency filter of '70Hz'$  and Cletus Paris low frequency filter of '1Hz'$ . EEG data were recorded continuously and digitally stored. Description: The posterior dominant rhythm consists of '8Hz'$  activity of moderate voltage (25-35 uV) seen predominantly in posterior head regions, symmetric and reactive to eye opening and eye closing. Sleep was characterized by vertex waves,  sleep spindles (12 to 14 Hz), maximal frontocentral region. Hyperventilation and photic stimulation were not performed.   IMPRESSION: This study is within normal limits. No seizures or epileptiform discharges were seen throughout the recording. Lora Havens   DG Shoulder Left  Result Date: 04/20/2022 CLINICAL DATA:  Syncopal episode.  Fall.  Shoulder pain. EXAM: LEFT SHOULDER - 2+ VIEW COMPARISON:  None Available. FINDINGS: There is no evidence of fracture or dislocation. Mild acromioclavicular degenerative change in small bony spur along the inferior aspect of the acromion. Mild glenohumeral degenerative change. IMPRESSION: No evidence of acute fracture or joint malalignment. Electronically Signed   By: Margaretha Sheffield M.D.   On: 04/20/2022 18:23   MR Cervical Spine Wo Contrast  Result Date: 04/20/2022 CLINICAL DATA:  Cervical radiculopathy, no red flags EXAM: MRI CERVICAL SPINE WITHOUT CONTRAST TECHNIQUE: Multiplanar, multisequence MR imaging of the cervical spine was performed. No intravenous contrast was administered. COMPARISON:  None Available. FINDINGS: Alignment: No substantial sagittal subluxation. Vertebrae: Vertebral body heights are maintained. No focal marrow edema to suggest acute fracture or discitis/osteomyelitis. No suspicious bone lesion. Cord: Normal cord signal. Posterior Fossa, vertebral arteries, paraspinal tissues: Visualized vertebral artery flow voids are maintained. Unremarkable visualized posterior fossa. Disc levels: C2-C3: Small posterior disc osteophyte complex. No significant canal or foraminal stenosis. C3-C4: Small posterior disc osteophyte complex. Bilateral facet and uncovertebral hypertrophy. Mild bilateral foraminal stenosis. Mild canal stenosis. C4-C5: Posterior disc osteophyte complex with ligamentum flavum thickening and right greater than left facet and uncovertebral hypertrophy. Resulting severe right and mild left foraminal stenosis. Mild to moderate canal  stenosis. C5-C6: Posterior disc osteophyte complex with right greater than left facet and uncovertebral hypertrophy. Resulting moderate right and mild left foraminal stenosis. Mild canal stenosis. C6-C7: Posterior disc osteophyte complex with bilateral facet and uncovertebral hypertrophy. Resulting mild-to-moderate left greater than right foraminal stenosis. Patent canal. C7-T1: No significant disc protrusion, foraminal stenosis, or canal stenosis. IMPRESSION: 1. At C4-C5, severe right and mild left foraminal stenosis with mild to moderate canal stenosis. 2. At  C5-C6, moderate right and mild left foraminal stenosis with mild canal stenosis. 3. At C6-C7, mild-to-moderate left greater than right foraminal stenosis. 4. At C3-C4, mild bilateral foraminal stenosis and mild canal stenosis. Electronically Signed   By: Margaretha Sheffield M.D.   On: 04/20/2022 18:11        Scheduled Meds:  gabapentin  400 mg Oral TID   potassium chloride  40 mEq Oral Q4H   Continuous Infusions:  sodium chloride 150 mL/hr at 04/22/22 1522     LOS: 2 days    Time spent: over 30 min    Fayrene Helper, MD [facility]   To contact the attending provider between 7A-7P or the covering provider during after hours 7P-7A, please log into the web site [email or web address] and access using universal [place] password for that web site. If you do not have the password, please call the hospital operator.  04/22/2022, 5:02 PM

## 2022-04-22 NOTE — TOC Initial Note (Signed)
Transition of Care [facility]) - Initial/Assessment Note    Patient Details  Name: Judy [name] MRN: [id number] Date of Birth: 24-Feb-1946  Transition [facility]) CM/SW Contact:    Ninfa Meeker, RN Phone Number: 04/22/2022, 9:31 AM  Clinical Narrative:     Transition of Care Screening Note:  Transition of Care Department [facility]) has reviewed patient and no TOC needs have been identified at this time. We will continue to monitor patient advancement through Interdisciplinary progressions. If new patient transition needs arise, please place a consult.                    Patient Goals and CMS Choice        Expected Discharge Plan and Services                                                Prior Living Arrangements/Services                       Activities of Daily Living Home Assistive Devices/Equipment: None ADL Screening (condition at time of admission) Patient's cognitive ability adequate to safely complete daily activities?: No Is the patient deaf or have difficulty hearing?: No Does the patient have difficulty seeing, even when wearing glasses/contacts?: No Does the patient have difficulty concentrating, remembering, or making decisions?: No Patient able to express need for assistance with ADLs?: Yes Does the patient have difficulty dressing or bathing?: Yes Independently performs ADLs?: No Communication: Independent Dressing (OT): Dependent, Needs assistance Is this a change from baseline?: Change from baseline, expected to last >3 days Grooming: Dependent, Needs assistance Is this a change from baseline?: Change from baseline, expected to last >3 days Feeding: Independent Bathing: Dependent Is this a change from baseline?: Change from baseline, expected to last >3 days Toileting: Dependent, Needs assistance Is this a change from baseline?: Change from baseline, expected to last >3days In/Out Bed: Dependent, Needs assistance Is this a  change from baseline?: Change from baseline, expected to last >3 days Walks in Home: Independent Does the patient have difficulty walking or climbing stairs?: Yes Weakness of Legs: Both Weakness of Arms/Hands: Both  Permission Sought/Granted                  Emotional Assessment              Admission diagnosis:  Syncope [R55] Right sided weakness [R53.1] Traumatic rhabdomyolysis, initial encounter ([place]) [T79.6XXA] Syncope, unspecified syncope type [R55] Patient Active Problem List   Diagnosis Date Noted   Vaginal bleeding 04/21/2022   Abnormal EKG 04/21/2022   Right sided weakness 04/20/2022   Chronic pain 04/20/2022   History of pulmonary embolism 04/20/2022   Rhabdomyolysis 04/20/2022   Pulmonary embolism ([place]) 11/15/2019   Spinal stenosis 11/07/2019   Weakness of both lower extremities 11/06/2019   Generalized anxiety disorder 07/24/2018   Solitary pulmonary nodule on lung CT 06/13/2017   Spinal stenosis at L4-L5 level    Dysphagia 05/25/2017   Tardive dyskinesia 05/25/2017   Dyspnea on exertion 05/25/2017   Fall at home, initial encounter 05/25/2017   Bilateral leg pain    Chronic pain of right knee 03/25/2017   High risk medication use 03/25/2017   Family hx of colon cancer 03/25/2017   Occupational exposure in workplace 03/25/2017   Depressed bipolar I disorder in  partial remission ([place]) 03/25/2017   Routine general medical examination at a health care facility 06/04/2014   Hyperlipidemia with target LDL less than 130 03/20/2014   Essential hypertension, benign 03/20/2014   Unspecified constipation 03/20/2014   Light headedness 03/20/2014   Acute on chronic kidney disease, stage 3 03/20/2014   Hypokalemia 03/20/2014   Encounter for therapeutic drug monitoring 03/20/2014   Other dysphagia 12/26/2013   GERD (gastroesophageal reflux disease) 12/21/2013   Constipation 12/21/2013   Syncope 12/10/2013   Leukocytosis 12/10/2013   URI (upper respiratory  infection) 12/10/2013   Bipolar 1 disorder (HCC)    Obesity (BMI 30-39.9)    Hearing loss in left ear    Vertigo    HYPERCHOLESTEROLEMIA 10/08/2010   Depression 10/08/2010   HYPERTENSION 10/08/2010   ALLERGIC RHINITIS 10/08/2010   PCP:  [name], FNP Pharmacy:   [id number] [place], [address]Phone: [phone]: [phone] [address]Phone: [phone]: [phone] [address] [place]Phone: [phone]: [phone] [facility] ([address]Phone: [phone]: [address] [address] [address]Phone: [phone]: [phone] [facility] [address] Registered [facility] Sites [address]Phone: [phone]: [phone]    Social Determinants of Health (SDOH) Interventions    Readmission Risk Interventions     No data to display

## 2022-04-22 NOTE — Progress Notes (Signed)
Speech Language Pathology Treatment: Dysphagia  Patient Details Name: Judy [name] MRN: [id number] DOB: 15-Nov-1945 Today's Date: 04/22/2022 Time: 0350-0938 SLP Time Calculation (min) (ACUTE ONLY): 9 min  Assessment / Plan / Recommendation Clinical Impression  Pt's swallowing clinically appears to be improved today, still with no overt s/s of aspiration. Her swallowing appears to be less effortful, and subjectively she reports that it feels closer to her baseline. At times she swallows one time per bolus, but subswallows are noted with what appear to be larger sips via straw. Pt attributes this to holding it in her mouth and swallowing it a little at a time, and it does have the appearance of more piecemeal swallowing. Although she says she eats regular solids at home, she prefers to stay on Dys 3 diet for now. Will leave Dys 3 diet and thin liquids in place with brief f/u acutely given mild signs of dysphagia that persist.    HPI HPI: Pt is a 76 yo female presenting s/p syncopal event and R sided weakness. CXR and MRI Brain without acute findings. Clinical swallow eval in August 2016 revealed mild lingual weakness and fasciculations and rapid, shallow respirations but no overt signs of aspiration and only mild oral dysphagia. Dys 3 diet and thin liquids recommended. PMH includes: tardive dyskinesia, bipolar 1, depression, HTN, hx PE, hearing loss L ear      SLP Plan  Continue with current plan of care      Recommendations for follow up therapy are one component of a multi-disciplinary discharge planning process, led by the attending physician.  Recommendations may be updated based on patient status, additional functional criteria and insurance authorization.    Recommendations  Diet recommendations: Dysphagia 3 (mechanical soft);Thin liquid Liquids provided via: Cup;Straw Medication Administration: Whole meds with puree Supervision: Intermittent supervision to cue for compensatory  strategies;Patient able to self feed Compensations: Slow rate;Small sips/bites Postural Changes and/or Swallow Maneuvers: Seated upright 90 degrees                Oral Care Recommendations: Oral care BID Follow Up Recommendations: No SLP follow up (none anticipated for swallowing) Assistance recommended at discharge: Intermittent Supervision/Assistance SLP Visit Diagnosis: Dysphagia, unspecified (R13.10) Plan: Continue with current plan of care           Osie Bond., M.A. [place] Office [phone]  Secure chat preferred  04/22/2022, 10:47 AM

## 2022-04-23 ENCOUNTER — Telehealth: Payer: Self-pay | Admitting: Physician Assistant

## 2022-04-23 ENCOUNTER — Inpatient Hospital Stay (HOSPITAL_COMMUNITY): Payer: Medicare HMO

## 2022-04-23 DIAGNOSIS — R55 Syncope and collapse: Secondary | ICD-10-CM | POA: Diagnosis not present

## 2022-04-23 LAB — COMPREHENSIVE METABOLIC PANEL
ALT: 34 U/L (ref 0–44)
AST: 57 U/L — ABNORMAL HIGH (ref 15–41)
Albumin: 2.5 g/dL — ABNORMAL LOW (ref 3.5–5.0)
Alkaline Phosphatase: 40 U/L (ref 38–126)
Anion gap: 4 — ABNORMAL LOW (ref 5–15)
BUN: 11 mg/dL (ref 8–23)
CO2: 24 mmol/L (ref 22–32)
Calcium: 8.4 mg/dL — ABNORMAL LOW (ref 8.9–10.3)
Chloride: 114 mmol/L — ABNORMAL HIGH (ref 98–111)
Creatinine, Ser: 0.8 mg/dL (ref 0.44–1.00)
GFR, Estimated: >60 mL/min (ref >60)
Glucose, Bld: 99 mg/dL (ref 70–99)
Potassium: 3.6 mmol/L (ref 3.5–5.1)
Sodium: 142 mmol/L (ref 135–145)
Total Bilirubin: 0.4 mg/dL (ref 0.3–1.2)
Total Protein: 4.9 g/dL — ABNORMAL LOW (ref 6.5–8.1)

## 2022-04-23 LAB — CBC WITH DIFFERENTIAL/PLATELET
Abs Immature Granulocytes: 0.02 10*3/uL (ref 0.00–0.07)
Basophils Absolute: 0 10*3/uL (ref 0.0–0.1)
Basophils Relative: 0 %
Eosinophils Absolute: 0.2 10*3/uL (ref 0.0–0.5)
Eosinophils Relative: 3 %
HCT: 34.1 % — ABNORMAL LOW (ref 36.0–46.0)
Hemoglobin: 11.5 g/dL — ABNORMAL LOW (ref 12.0–15.0)
Immature Granulocytes: 0 %
Lymphocytes Relative: 30 %
Lymphs Abs: 2 10*3/uL (ref 0.7–4.0)
MCH: 30.1 pg (ref 26.0–34.0)
MCHC: 33.7 g/dL (ref 30.0–36.0)
MCV: 89.3 fL (ref 80.0–100.0)
Monocytes Absolute: 0.6 10*3/uL (ref 0.1–1.0)
Monocytes Relative: 9 %
Neutro Abs: 3.8 10*3/uL (ref 1.7–7.7)
Neutrophils Relative %: 58 %
Platelets: 175 10*3/uL (ref 150–400)
RBC: 3.82 MIL/uL — ABNORMAL LOW (ref 3.87–5.11)
RDW: 14.7 % (ref 11.5–15.5)
WBC: 6.6 10*3/uL (ref 4.0–10.5)
nRBC: 0 % (ref 0.0–0.2)

## 2022-04-23 LAB — CK
Total CK: 1641 U/L — ABNORMAL HIGH (ref 38–234)
Total CK: 2007 U/L — ABNORMAL HIGH (ref 38–234)

## 2022-04-23 LAB — MAGNESIUM: Magnesium: 1.8 mg/dL (ref 1.7–2.4)

## 2022-04-23 LAB — PHOSPHORUS: Phosphorus: 2.5 mg/dL (ref 2.5–4.6)

## 2022-04-23 MED ORDER — ENOXAPARIN SODIUM 40 MG/0.4ML IJ SOSY
40.0000 mg | PREFILLED_SYRINGE | INTRAMUSCULAR | Status: DC
  Administered 2022-04-23 – 2022-04-27 (×5): 40 mg via SUBCUTANEOUS
  Filled 2022-04-23 (×5): qty 0.4

## 2022-04-23 NOTE — Telephone Encounter (Signed)
I called not realizing it was the dr who wanted to speak with you,that is his personal phone number he would like a call back

## 2022-04-23 NOTE — Progress Notes (Signed)
PROGRESS NOTE    Judy [name]  XTK:[id number] DOB: April 25, 1946 DOA: 04/20/2022 PCP: [name], FNP  Chief Complaint  Patient presents with   Loss of Consciousness    Brief Narrative:  Judy [name] is Judy [name] 76 y.o. female with medical history significant of bipolar 1, depression, HTN, hx PE, hx tardive dyskinesia and multiple other medical problems who reportedly went to the bathroom yesterday morning and passed out.  She woke up this morning on the floor covered in fecal matter.  Reportedly she reported R sided weakness in the right side before she fell.     She reports she was going to the bathroom and then passed out.  She's unable to tell me whether she had R sided weakness before the LOC, but this was reported in the ED triage note.  She did not regain consciousness until this morning when she activated her medic alert button.  She was covered in feces and urine when whe was found.  She was able to walk Judy [name] few steps with EMS, but continues to have right sided weakness.  She typically walks without assistance.  She denies fevers.  Notes chills.  Denies chest pain or SOB, denies nausea.     ED Course: imaging.  MRI brain without stroke.  MRI spine pending.  Admit for R sided weakness, syncope    Assessment & Plan:   Principal Problem:   Syncope Active Problems:   Fall at home, initial encounter   Right sided weakness   Rhabdomyolysis   Abnormal EKG   Chronic pain   Vaginal bleeding   Depression   History of pulmonary embolism   Bipolar 1 disorder (HCC)   Tardive dyskinesia   Assessment and Plan: * Syncope Passed out, LOC reported from 7/10-11? Check orthostatics when able, pending Echo with EF 99-24%, grade 1 diastolic dysfunction EEG without seizures or epileptiform discharges  Fall at home, initial encounter In setting of syncope Plain films of chest, pelvis, right humerus negative Head CT without acute intracranial abnormality C spine without acute intracranial  abnormality L shoulder plain films negative PT/OT -> recommending snf Workup for syncope, right sided weakness ongoing  Right sided weakness Right sided weakness and tremor MRI brain without acute infarction, moderate to marked chronic microvascular ischemic changes, mild burden of chronic microhemorrhages MRI C spine with severe right and mild L foraminal stneosis with mild to moderate canal stenosis at C4-5.  Moderate right and mild L foraminal stenosis with mild canal stenosis at C5-6.  At c6-7 mild to moderate L>R foraminal stenosis.  At c3-4 mild bilateral foraminal stenosis and mild canal stenosis. Per discussion with neurology, possibly compressive neuropathy from being found down? Improved weakness (since tremor improved after stopping ingrezza - RLE still slightly weaker, will follow imaging L spine MRI) PT/OT  Abnormal EKG No evidence of arrhythmia, appreciate cards   Rhabdomyolysis Elevated CK to >2000 Small Hb, but no RBC's noted Follow CK trend - relatively stable today, trend IVF, continue  Vaginal bleeding Postmenopausal bleeding Seems improved at this time Follow pelvic ultrasound -> fibroids Will need outpatient follow up  Chronic pain Continue gabapentin  Depression Not taking wellbutrin anymore  History of pulmonary embolism Not currently on any anticoagulation This appears to have been diagnosed in 11/2019 was treated with eliquis at that time  Tardive dyskinesia ingrezza on hold -> tremor has improved with this -> needs to follow with neurology or psych outpatient  Bipolar 1 disorder ([place]) noted  DVT prophylaxis: resume lovenox and follow Code Status: dnr Family Communication: none Disposition:   Status is: Inpatient Remains inpatient appropriate because: need for continued workup   Consultants:  cardiology  Procedures:  Echo IMPRESSIONS     1. Left ventricular ejection fraction, by estimation, is 60 to 65%. The  left ventricle  has normal function. The left ventricle has no regional  wall motion abnormalities. There is moderate concentric left ventricular  hypertrophy. Left ventricular  diastolic parameters are consistent with Grade I diastolic dysfunction  (impaired relaxation).   2. Right ventricular systolic function is normal. The right ventricular  size is normal. There is normal pulmonary artery systolic pressure. The  estimated right ventricular systolic pressure is 34.2 mmHg.   3. The mitral valve is normal in structure. Trivial mitral valve  regurgitation. No evidence of mitral stenosis.   4. The aortic valve is normal in structure. Aortic valve regurgitation is  not visualized. No aortic stenosis is present.   5. The inferior vena cava is normal in size with greater than 50%  respiratory variability, suggesting right atrial pressure of 3 mmHg.  Antimicrobials:  Anti-infectives (From admission, onward)    None       Subjective: No new complaints, feeling better  Objective: Vitals:   04/23/22 0402 04/23/22 0500 04/23/22 0849 04/23/22 1221  BP: (!) 143/75  (!) 146/86 (!) 152/90  Pulse: (!) 57 92 70 60  Resp: '19 18 17 17  '$ Temp:   97.9 F (36.6 C) 99.1 F (37.3 C)  TempSrc:   Oral Oral  SpO2: 97% 95% 95% 97%  Weight:  81.9 kg    Height:        Intake/Output Summary (Last 24 hours) at 04/23/2022 1547 Last data filed at 04/23/2022 0700 Gross per 24 hour  Intake 2167.06 ml  Output --  Net 2167.06 ml   Filed Weights   04/20/22 1153 04/22/22 0117 04/23/22 0500  Weight: 78 kg 81.8 kg 81.9 kg    Examination:  General: No acute distress. Cardiovascular: Heart sounds show Judy [name] regular rate, and rhythm. No gallops or rubs. No murmurs. No JVD. Lungs: unlabored Abdomen: Soft, nontender, nondistended Neurological: Alert and oriented 3. R sided weakness, RLE>RUE (very mild 4+ to RUE).  Improved tremor. Extremities: No clubbing or cyanosis. No edema.   Data Reviewed: I have personally  reviewed following labs and imaging studies  CBC: Recent Labs  Lab 04/20/22 1137 04/20/22 1259 04/21/22 0413 04/21/22 1334 04/22/22 0039 04/23/22 0121  WBC 5.2 14.1* 8.5  --  7.5 6.6  NEUTROABS 4.4  --   --   --  4.1 3.8  HGB 4.7* 14.2 12.4 13.0 11.9* 11.5*  HCT 14.6* 41.4 37.4 41.0 35.1* 34.1*  MCV 96.7 89.2 91.9  --  89.1 89.3  PLT 79* 227 204  --  213 876    Basic Metabolic Panel: Recent Labs  Lab 04/20/22 1259 04/21/22 0413 04/22/22 0039 04/23/22 0121  NA 142 143 144 142  K 3.5 3.0* 3.1* 3.6  CL 109 115* 113* 114*  CO2 20* '22 25 24  '$ GLUCOSE 93 92 99 99  BUN '17 13 16 11  '$ CREATININE 1.09* 1.00 1.07* 0.80  CALCIUM 9.4 9.0 8.9 8.4*  MG  --   --  1.9 1.8  PHOS  --   --  2.9 2.5    GFR: Estimated Creatinine Clearance: 70.6 mL/[name] (by C-G formula based on SCr of 0.8 mg/dL).  Liver Function Tests: Recent Labs  Lab 04/20/22  1259 04/21/22 0413 04/22/22 0039 04/23/22 0121  AST 77* 74* 72* 57*  ALT 25 25 32 34  ALKPHOS 51 45 42 40  BILITOT 0.8 0.7 0.4 0.4  PROT 6.2* 5.5* 5.2* 4.9*  ALBUMIN 3.6 3.0* 2.7* 2.5*    CBG: Recent Labs  Lab 04/20/22 1123  GLUCAP 108*     No results found for this or any previous visit (from the past 240 hour(s)).       Radiology Studies: US PELVIS (TRANSABDOMINAL ONLY)  Result Date: 04/22/2022 CLINICAL DATA:  Vaginal bleeding EXAM: TRANSABDOMINAL ULTRASOUND OF PELVIS TECHNIQUE: Transabdominal ultrasound examination of the pelvis was performed including evaluation of the uterus, ovaries, adnexal regions, and pelvic cul-de-sac. COMPARISON:  None Available. FINDINGS: Uterus Measurements: 12.3 x 6.1 x 7.0 cm = volume: 272 mL. 5.1 cm right fundal fibroid. 4.7 cm left fundal fibroid. Endometrium Thickness: 4 mm in thickness.  No focal abnormality visualized. Right ovary Measurements: Not visualized.  No adnexal mass seen. Left ovary Measurements: Not visualized.  No adnexal mass seen. Other findings: No abnormal free fluid. Patient  refused transvaginal imaging. IMPRESSION: Uterine fibroids. No acute findings. Electronically Signed   By: Rolm Baptise M.D.   On: 04/22/2022 00:06        Scheduled Meds:  enoxaparin (LOVENOX) injection  40 mg Subcutaneous Q24H   gabapentin  400 mg Oral TID   Continuous Infusions:  sodium chloride 150 mL/hr at 04/23/22 1237     LOS: 3 days    Time spent: over 30 [name]    Fayrene Helper, MD [facility]   To contact the attending provider between 7A-7P or the covering provider during after hours 7P-7A, please log into the web site [email or web address] and access using universal [place] password for that web site. If you do not have the password, please call the hospital operator.  04/23/2022, 3:47 PM

## 2022-04-23 NOTE — Telephone Encounter (Signed)
I spoke with Dr. Florene Glen.  She is in the hospital now after a fall and lost consciousness.  She has a right-sided tremor, he has held the [place] and the tremor seems to be better.  Stay off the [place] for now.  I will reevaluate when she is discharged.  I will ask our office staff to give her a call in a few weeks to set up an appointment.  She has been off an antipsychotic for a while and she may not need it anymore, however sometimes TD can be permanent and we may need to restart it.  That will be decided at her follow-up visit, 1 downside of Ingrezza is that it can cause a parkinsonian gait which may cause falls.

## 2022-04-23 NOTE — NC FL2 (Addendum)
[place] MEDICAID FL2 LEVEL OF CARE SCREENING TOOL     IDENTIFICATION  Patient Name: Judy [name] Birthdate: Oct 12, 1945 Sex: female Admission Date (Current Location): 04/20/2022  [facility] and [place] Number:  Producer, television/film/video and Address:  [facility] [place], [address], [place], [place] [address]      Provider Number: [id number]  Attending Physician Name and Address:  Zigmund Daniel., *  Relative Name and Phone Number:       Current Level of Care: Hospital Recommended Level of Care: Skilled Nursing Facility Prior Approval Number:    Date Approved/Denied:   PASRR Number: [id number] E expires  05/26/2022  Discharge Plan: SNF    Current Diagnoses: Patient Active Problem List   Diagnosis Date Noted   Vaginal bleeding 04/21/2022   Abnormal EKG 04/21/2022   Right sided weakness 04/20/2022   Chronic pain 04/20/2022   History of pulmonary embolism 04/20/2022   Rhabdomyolysis 04/20/2022   Pulmonary embolism (HCC) 11/15/2019   Spinal stenosis 11/07/2019   Weakness of both lower extremities 11/06/2019   Generalized anxiety disorder 07/24/2018   Solitary pulmonary nodule on lung CT 06/13/2017   Spinal stenosis at L4-L5 level    Dysphagia 05/25/2017   Tardive dyskinesia 05/25/2017   Dyspnea on exertion 05/25/2017   Fall at home, initial encounter 05/25/2017   Bilateral leg pain    Chronic pain of right knee 03/25/2017   High risk medication use 03/25/2017   Family hx of colon cancer 03/25/2017   Occupational exposure in workplace 03/25/2017   Depressed bipolar I disorder in partial remission (HCC) 03/25/2017   Routine general medical examination at a health care facility 06/04/2014   Hyperlipidemia with target LDL less than 130 03/20/2014   Essential hypertension, benign 03/20/2014   Unspecified constipation 03/20/2014   Light headedness 03/20/2014   Acute on chronic kidney disease, stage 3 03/20/2014   Hypokalemia 03/20/2014    Encounter for therapeutic drug monitoring 03/20/2014   Other dysphagia 12/26/2013   GERD (gastroesophageal reflux disease) 12/21/2013   Constipation 12/21/2013   Syncope 12/10/2013   Leukocytosis 12/10/2013   URI (upper respiratory infection) 12/10/2013   Bipolar 1 disorder (HCC)    Obesity (BMI 30-39.9)    Hearing loss in left ear    Vertigo    HYPERCHOLESTEROLEMIA 10/08/2010   Depression 10/08/2010   HYPERTENSION 10/08/2010   ALLERGIC RHINITIS 10/08/2010    Orientation RESPIRATION BLADDER Height & Weight     Self, Time, Situation, Place  Normal Incontinent, External catheter Weight: 180 lb 8.9 oz (81.9 kg) Height:  5\' 9"  (175.3 cm)  BEHAVIORAL SYMPTOMS/MOOD NEUROLOGICAL BOWEL NUTRITION STATUS      Continent Diet (See discharge summary.)  AMBULATORY STATUS COMMUNICATION OF NEEDS Skin   Extensive Assist Verbally Normal                       Personal Care Assistance Level of Assistance  Bathing, Feeding, Dressing Bathing Assistance: Maximum assistance Feeding assistance: Maximum assistance Dressing Assistance: Maximum assistance     Functional Limitations Info  Sight, Hearing Sight Info: Adequate Hearing Info: Impaired      SPECIAL CARE FACTORS FREQUENCY  PT (By licensed PT), OT (By licensed OT)     PT Frequency: 5x/week OT Frequency: 5x/week            Contractures Contractures Info: Not present    Additional Factors Info  Code Status, Allergies Code Status Info: DNR Allergies Info: No known allergies.  Current Medications (04/23/2022):  This is the current hospital active medication list Current Facility-Administered Medications  Medication Dose Route Frequency Provider Last Rate Last Admin   0.9 %  sodium chloride infusion   Intravenous Continuous Zigmund Daniel., MD 150 mL/hr at 04/23/22 0457 New Bag at 04/23/22 0457   acetaminophen (TYLENOL) tablet 650 mg  650 mg Oral Q6H PRN Zigmund Daniel., MD       Or    acetaminophen (TYLENOL) suppository 650 mg  650 mg Rectal Q6H PRN Zigmund Daniel., MD       albuterol (PROVENTIL) (2.5 MG/3ML) 0.083% nebulizer solution 2.5 mg  2.5 mg Nebulization Q6H PRN Zigmund Daniel., MD       gabapentin (NEURONTIN) capsule 400 mg  400 mg Oral TID Zigmund Daniel., MD   400 mg at 04/23/22 1022   meclizine (ANTIVERT) tablet 12.5 mg  12.5 mg Oral TID PRN Zigmund Daniel., MD       polyethylene glycol (MIRALAX / GLYCOLAX) packet 17 g  17 g Oral Daily PRN Zigmund Daniel., MD         Discharge Medications: Please see discharge summary for a list of discharge medications.  Relevant Imaging Results:  Relevant Lab Results:   Additional Information SSN: [id number], COVID Immunizations: Pfizer 12/24/19, 01/15/20, 07/14/20, 10/09/20  Judy Latin, LCSW

## 2022-04-23 NOTE — TOC Progression Note (Addendum)
Transition of Care [facility]) - Progression Note    Patient Details  Name: Judy [name] MRN: [id number] Date of Birth: Mar 21, 1946  Transition of Care [facility]) CM/SW [place], [place] Work Phone Number: 04/23/2022, 1:43 PM  Clinical Narrative:    MSW intern spoke with patient at bedside regarding the discharge plan to SNF. Patient was having a hard time understanding MSW intern due to impaired hearing, but stated she was in agreement with going to a SNF. Patient asked MSW to please contact her brother, [name] and explain the process to him. MSW will begin SNF workup. Patient requests to stay in the [place] area if at all possible.  Attempted to call brother, [name], left voicemail.    Expected Discharge Plan: [place] Barriers to Discharge: Continued Medical Work up  Expected Discharge Plan and Services Expected Discharge Plan: [place] Choice: [place] arrangements for the past 2 months: Single Family Home                                       Social Determinants of Health (SDOH) Interventions    Readmission Risk Interventions     No data to display

## 2022-04-23 NOTE — Care Management Important Message (Signed)
Important Message  Patient Details  Name: Judy [name] MRN: [id number] Date of Birth: Feb 11, 1946   Medicare Important Message Given:  Yes     Orbie Pyo 04/23/2022, 3:59 PM

## 2022-04-23 NOTE — Progress Notes (Signed)
RE: Judy [name] Date of Birth: 09-12-2046 Date: 04/23/22  Please be advised that the above-named patient will require a short-term nursing home stay - anticipated 30 days or less for rehabilitation and strengthening.  The plan is for return home.

## 2022-04-23 NOTE — Telephone Encounter (Signed)
Dr. Florene Glen called at 3:30 pm stating that he wants to talk to teresa about [place]. She is currently in the hospital and he would like to talk to teresa. His phone number is [phone]

## 2022-04-24 DIAGNOSIS — R55 Syncope and collapse: Secondary | ICD-10-CM | POA: Diagnosis not present

## 2022-04-24 LAB — COMPREHENSIVE METABOLIC PANEL
ALT: 35 U/L (ref 0–44)
AST: 64 U/L — ABNORMAL HIGH (ref 15–41)
Albumin: 2.6 g/dL — ABNORMAL LOW (ref 3.5–5.0)
Alkaline Phosphatase: 44 U/L (ref 38–126)
Anion gap: 11 (ref 5–15)
BUN: 8 mg/dL (ref 8–23)
CO2: 25 mmol/L (ref 22–32)
Calcium: 9.2 mg/dL (ref 8.9–10.3)
Chloride: 109 mmol/L (ref 98–111)
Creatinine, Ser: 0.76 mg/dL (ref 0.44–1.00)
GFR, Estimated: >60 mL/min (ref >60)
Glucose, Bld: 137 mg/dL — ABNORMAL HIGH (ref 70–99)
Potassium: 3.6 mmol/L (ref 3.5–5.1)
Sodium: 145 mmol/L (ref 135–145)
Total Bilirubin: 0.5 mg/dL (ref 0.3–1.2)
Total Protein: 5.2 g/dL — ABNORMAL LOW (ref 6.5–8.1)

## 2022-04-24 LAB — CBC WITH DIFFERENTIAL/PLATELET
Abs Immature Granulocytes: 0 10*3/uL (ref 0.00–0.07)
Basophils Absolute: 0 10*3/uL (ref 0.0–0.1)
Basophils Relative: 1 %
Eosinophils Absolute: 0.3 10*3/uL (ref 0.0–0.5)
Eosinophils Relative: 6 %
HCT: 37.1 % (ref 36.0–46.0)
Hemoglobin: 12.3 g/dL (ref 12.0–15.0)
Immature Granulocytes: 0 %
Lymphocytes Relative: 35 %
Lymphs Abs: 1.6 10*3/uL (ref 0.7–4.0)
MCH: 29.7 pg (ref 26.0–34.0)
MCHC: 33.2 g/dL (ref 30.0–36.0)
MCV: 89.6 fL (ref 80.0–100.0)
Monocytes Absolute: 0.3 10*3/uL (ref 0.1–1.0)
Monocytes Relative: 7 %
Neutro Abs: 2.4 10*3/uL (ref 1.7–7.7)
Neutrophils Relative %: 51 %
Platelets: 192 10*3/uL (ref 150–400)
RBC: 4.14 MIL/uL (ref 3.87–5.11)
RDW: 14.4 % (ref 11.5–15.5)
WBC: 4.6 10*3/uL (ref 4.0–10.5)
nRBC: 0 % (ref 0.0–0.2)

## 2022-04-24 LAB — PHOSPHORUS: Phosphorus: 3.1 mg/dL (ref 2.5–4.6)

## 2022-04-24 LAB — MAGNESIUM: Magnesium: 1.5 mg/dL — ABNORMAL LOW (ref 1.7–2.4)

## 2022-04-24 LAB — CK: Total CK: 1559 U/L — ABNORMAL HIGH (ref 38–234)

## 2022-04-24 MED ORDER — MAGNESIUM SULFATE 2 GM/50ML IV SOLN
2.0000 g | Freq: Once | INTRAVENOUS | Status: AC
  Administered 2022-04-24: 2 g via INTRAVENOUS
  Filled 2022-04-24: qty 50

## 2022-04-24 MED ORDER — POLYVINYL ALCOHOL 1.4 % OP SOLN
2.0000 [drp] | Freq: Four times a day (QID) | OPHTHALMIC | Status: DC
  Administered 2022-04-24 – 2022-04-28 (×17): 2 [drp] via OPHTHALMIC
  Filled 2022-04-24: qty 15

## 2022-04-24 NOTE — Progress Notes (Signed)
PROGRESS NOTE    Judy [name]  ZRA:[id number] DOB: 1946/02/25 DOA: 04/20/2022 PCP: [name], FNP  Chief Complaint  Patient presents with   Loss of Consciousness    Brief Narrative:  Judy [name] is Judy [name] 76 y.o. female with medical history significant of bipolar 1, depression, HTN, hx PE, hx tardive dyskinesia and multiple other medical problems who reportedly went to the bathroom yesterday morning and passed out.  She woke up this morning on the floor covered in fecal matter.  Reportedly she reported R sided weakness in the right side before she fell.     She reports she was going to the bathroom and then passed out.  She's unable to tell me whether she had R sided weakness before the LOC, but this was reported in the ED triage note.  She did not regain consciousness until this morning when she activated her medic alert button.  She was covered in feces and urine when whe was found.  She was able to walk [place] few steps with EMS, but continues to have right sided weakness.  She typically walks without assistance.  She denies fevers.  Notes chills.  Denies chest pain or SOB, denies nausea.     ED Course: imaging.  MRI brain without stroke.  MRI spine pending.  Admit for R sided weakness, syncope    Assessment & Plan:   Principal Problem:   Syncope Active Problems:   Fall at home, initial encounter   Right sided weakness   Rhabdomyolysis   Abnormal EKG   Chronic pain   Vaginal bleeding   Depression   History of pulmonary embolism   Bipolar 1 disorder (HCC)   Tardive dyskinesia   Assessment and Plan: * Syncope Passed out, LOC reported from 7/10-11? Check orthostatics when able, pending Echo with EF 54-56%, grade 1 diastolic dysfunction EEG without seizures or epileptiform discharges  Fall at home, initial encounter In setting of syncope Plain films of chest, pelvis, right humerus negative Head CT without acute intracranial abnormality C spine without acute intracranial  abnormality L shoulder plain films negative PT/OT -> recommending snf Workup for syncope, right sided weakness ongoing  Right sided weakness Right sided weakness and tremor MRI brain without acute infarction, moderate to marked chronic microvascular ischemic changes, mild burden of chronic microhemorrhages MRI C spine with severe right and mild L foraminal stneosis with mild to moderate canal stenosis at C4-5.  Moderate right and mild L foraminal stenosis with mild canal stenosis at C5-6.  At c6-7 mild to moderate L>R foraminal stenosis.  At c3-4 mild bilateral foraminal stenosis and mild canal stenosis. Per discussion with neurology, possibly compressive neuropathy from being found down? Improved weakness (since tremor improved after stopping ingrezza) MRI L spine with progressive multilevel lumbar spondylosis, severe canal stenosis with severe bilateral foraminal stenosis at L4-5, moderate to severe R and mild L foraminal stenosis at L5-S1 PT/OT  Abnormal EKG No evidence of arrhythmia, appreciate cards   Rhabdomyolysis Elevated CK to >2000 Small Hb, but no RBC's noted Follow CK trend - relatively stable again today, trend IVF, continue  Vaginal bleeding Postmenopausal bleeding Seems improved at this time Follow pelvic ultrasound -> fibroids Will need outpatient follow up  Chronic pain Continue gabapentin  Depression Not taking wellbutrin anymore  History of pulmonary embolism Not currently on any anticoagulation This appears to have been diagnosed in 11/2019 was treated with eliquis at that time  Tardive dyskinesia ingrezza on hold -> tremor has improved with this ->  more notable tardive dyskinesia since holding, she wishes to continue to hold ingrezza  Bipolar 1 disorder ([place]) noted     DVT prophylaxis: resume lovenox and follow Code Status: dnr Family Communication: none Disposition:   Status is: Inpatient Remains inpatient appropriate because: need for  continued workup   Consultants:  cardiology  Procedures:  Echo IMPRESSIONS     1. Left ventricular ejection fraction, by estimation, is 60 to 65%. The  left ventricle has normal function. The left ventricle has no regional  wall motion abnormalities. There is moderate concentric left ventricular  hypertrophy. Left ventricular  diastolic parameters are consistent with Grade I diastolic dysfunction  (impaired relaxation).   2. Right ventricular systolic function is normal. The right ventricular  size is normal. There is normal pulmonary artery systolic pressure. The  estimated right ventricular systolic pressure is 52.8 mmHg.   3. The mitral valve is normal in structure. Trivial mitral valve  regurgitation. No evidence of mitral stenosis.   4. The aortic valve is normal in structure. Aortic valve regurgitation is  not visualized. No aortic stenosis is present.   5. The inferior vena cava is normal in size with greater than 50%  respiratory variability, suggesting right atrial pressure of 3 mmHg.  Antimicrobials:  Anti-infectives (From admission, onward)    None       Subjective: Feeling improved Asking for eye drops  Objective: Vitals:   04/24/22 0450 04/24/22 0859 04/24/22 1158 04/24/22 1639  BP:  (!) 119/99 126/74 127/80  Pulse:  65 (!) 52 61  Resp: '16 20 20 20  '$ Temp:  98.6 F (37 C) 98 F (36.7 C) 98 F (36.7 C)  TempSrc:  Oral Oral Oral  SpO2:      Weight:      Height:        Intake/Output Summary (Last 24 hours) at 04/24/2022 1754 Last data filed at 04/24/2022 1638 Gross per 24 hour  Intake 2400 ml  Output 1200 ml  Net 1200 ml   Filed Weights   04/23/22 0500 04/23/22 2312 04/24/22 0408  Weight: 81.9 kg 77.9 kg 77.9 kg    Examination:  General: No acute distress. Cardiovascular: RRR Lungs: unlabored Neurological: r sided weakness is improved  Extremities: No clubbing or cyanosis. No edema.  Data Reviewed: I have personally reviewed following  labs and imaging studies  CBC: Recent Labs  Lab 04/20/22 1137 04/20/22 1259 04/21/22 0413 04/21/22 1334 04/22/22 0039 04/23/22 0121 04/24/22 0934  WBC 5.2 14.1* 8.5  --  7.5 6.6 4.6  NEUTROABS 4.4  --   --   --  4.1 3.8 2.4  HGB 4.7* 14.2 12.4 13.0 11.9* 11.5* 12.3  HCT 14.6* 41.4 37.4 41.0 35.1* 34.1* 37.1  MCV 96.7 89.2 91.9  --  89.1 89.3 89.6  PLT 79* 227 204  --  213 175 413    Basic Metabolic Panel: Recent Labs  Lab 04/20/22 1259 04/21/22 0413 04/22/22 0039 04/23/22 0121 04/24/22 0934  NA 142 143 144 142 145  K 3.5 3.0* 3.1* 3.6 3.6  CL 109 115* 113* 114* 109  CO2 20* '22 25 24 25  '$ GLUCOSE 93 92 99 99 137*  BUN '17 13 16 11 8  '$ CREATININE 1.09* 1.00 1.07* 0.80 0.76  CALCIUM 9.4 9.0 8.9 8.4* 9.2  MG  --   --  1.9 1.8 1.5*  PHOS  --   --  2.9 2.5 3.1    GFR: Estimated Creatinine Clearance: 64.5 mL/min (by C-G formula  based on SCr of 0.76 mg/dL).  Liver Function Tests: Recent Labs  Lab 04/20/22 1259 04/21/22 0413 04/22/22 0039 04/23/22 0121 04/24/22 0934  AST 77* 74* 72* 57* 64*  ALT 25 25 32 34 35  ALKPHOS 51 45 42 40 44  BILITOT 0.8 0.7 0.4 0.4 0.5  PROT 6.2* 5.5* 5.2* 4.9* 5.2*  ALBUMIN 3.6 3.0* 2.7* 2.5* 2.6*    CBG: Recent Labs  Lab 04/20/22 1123  GLUCAP 108*     No results found for this or any previous visit (from the past 240 hour(s)).       Radiology Studies: MR LUMBAR SPINE WO CONTRAST  Result Date: 04/23/2022 CLINICAL DATA:  Right lower extremity weakness EXAM: MRI LUMBAR SPINE WITHOUT CONTRAST TECHNIQUE: Multiplanar, multisequence MR imaging of the lumbar spine was performed. No intravenous contrast was administered. COMPARISON:  MRI 05/25/2017, CT 11/15/2019 FINDINGS: Technical Note: Despite efforts by the technologist and patient, motion artifact is present on today's exam and could not be eliminated. This reduces exam sensitivity and specificity. Segmentation:  Standard. Alignment: 5 mm grade 1 anterolisthesis L4 on L5,  increased from prior. Vertebrae: Chronic mild superior endplate compression deformity of the T12 vertebral body with associated superior endplate Schmorl's node. No residual bone marrow edema the fracture site. Discogenic endplate marrow changes at the L5-S1 level. No acute fracture. No evidence of discitis. No suspicious bone lesion. Conus medullaris and cauda equina: Conus extends to the L1-2 level. Conus appears normal. There is slight bunching of the cauda equina nerve roots above the L4-5 level of stenosis. Paraspinal and other soft tissues: Multifibroid uterus. Disc levels: T12-L1: Unremarkable. L1-L2: Unremarkable. L2-L3: Minimal disc bulge. Mild bilateral facet arthropathy with ligamentum flavum buckling. No canal stenosis. Borderline-mild bilateral foraminal stenosis. No significant interval progression from prior. L3-L4: Minimal disc bulge. Left far lateral annular fissure. Moderate bilateral facet arthropathy with ligamentum flavum buckling. No canal stenosis. Mild bilateral foraminal stenosis, left worse than right. No significant interval progression from prior. L4-L5: Anterolisthesis with disc uncovering and disc bulge. Advanced bilateral facet arthropathy with ligamentum flavum buckling. Severe canal stenosis with severe bilateral foraminal stenosis. Findings progressed from prior. L5-S1: Disc height loss with diffuse disc bulge, eccentric to the right. Mild-moderate bilateral facet arthropathy. Moderate-severe right and mild left foraminal stenosis. No canal stenosis. Findings progressed from prior. IMPRESSION: 1. Progressive multilevel lumbar spondylosis, as above. 2. Severe canal stenosis with severe bilateral foraminal stenosis at L4-L5. 3. Moderate-severe right and mild left foraminal stenosis at L5-S1. Electronically Signed   By: Davina Poke D.O.   On: 04/23/2022 18:35        Scheduled Meds:  enoxaparin (LOVENOX) injection  40 mg Subcutaneous Q24H   gabapentin  400 mg Oral TID    polyvinyl alcohol  2 drop Both Eyes QID   Continuous Infusions:  sodium chloride 150 mL/hr at 04/24/22 1638     LOS: 4 days    Time spent: over 30 min    Fayrene Helper, MD [facility]   To contact the attending provider between 7A-7P or the covering provider during after hours 7P-7A, please log into the web site [email or web address] and access using universal [place] password for that web site. If you do not have the password, please call the hospital operator.  04/24/2022, 5:54 PM

## 2022-04-25 DIAGNOSIS — R55 Syncope and collapse: Secondary | ICD-10-CM | POA: Diagnosis not present

## 2022-04-25 LAB — COMPREHENSIVE METABOLIC PANEL
ALT: 36 U/L (ref 0–44)
AST: 55 U/L — ABNORMAL HIGH (ref 15–41)
Albumin: 2.8 g/dL — ABNORMAL LOW (ref 3.5–5.0)
Alkaline Phosphatase: 44 U/L (ref 38–126)
Anion gap: 9 (ref 5–15)
BUN: 9 mg/dL (ref 8–23)
CO2: 25 mmol/L (ref 22–32)
Calcium: 9 mg/dL (ref 8.9–10.3)
Chloride: 108 mmol/L (ref 98–111)
Creatinine, Ser: 0.72 mg/dL (ref 0.44–1.00)
GFR, Estimated: >60 mL/min (ref >60)
Glucose, Bld: 96 mg/dL (ref 70–99)
Potassium: 4.1 mmol/L (ref 3.5–5.1)
Sodium: 142 mmol/L (ref 135–145)
Total Bilirubin: 0.6 mg/dL (ref 0.3–1.2)
Total Protein: 5.4 g/dL — ABNORMAL LOW (ref 6.5–8.1)

## 2022-04-25 LAB — MAGNESIUM: Magnesium: 2.1 mg/dL (ref 1.7–2.4)

## 2022-04-25 LAB — CK: Total CK: 1471 U/L — ABNORMAL HIGH (ref 38–234)

## 2022-04-25 MED ORDER — SODIUM CHLORIDE 0.9 % IV BOLUS
500.0000 mL | Freq: Once | INTRAVENOUS | Status: AC
  Administered 2022-04-25: 500 mL via INTRAVENOUS

## 2022-04-25 NOTE — Progress Notes (Signed)
PROGRESS NOTE    Judy [name]  QIW:[id number] DOB: [birth year] DOA: 04/20/2022 PCP: [name], FNP  Chief Complaint  Patient presents with   Loss of Consciousness    Brief Narrative:  Judy [name] is Judy [name] 76 y.o. female with medical history significant of bipolar 1, depression, HTN, hx PE, hx tardive dyskinesia and multiple other medical problems who reportedly went to the bathroom yesterday morning and passed out.  She woke up this morning on the floor covered in fecal matter.  Reportedly she reported R sided weakness in the right side before she fell.     She reports she was going to the bathroom and then passed out.  She's unable to tell me whether she had R sided weakness before the LOC, but this was reported in the ED triage note.  She did not regain consciousness until this morning when she activated her medic alert button.  She was covered in feces and urine when whe was found.  She was able to walk [place] few steps with EMS, but continues to have right sided weakness.  She typically walks without assistance.  She denies fevers.  Notes chills.  Denies chest pain or SOB, denies nausea.     ED Course: imaging.  MRI brain without stroke.  MRI spine pending.  Admit for R sided weakness, syncope    Assessment & Plan:   Principal Problem:   Syncope Active Problems:   Fall at home, initial encounter   Right sided weakness   Rhabdomyolysis   Abnormal EKG   Chronic pain   Vaginal bleeding   Depression   History of pulmonary embolism   Bipolar 1 disorder (HCC)   Tardive dyskinesia   Assessment and Plan: * Syncope Passed out, LOC reported from 7/10-11? Check orthostatics when able, pending Echo with EF 41-74%, grade 1 diastolic dysfunction EEG without seizures or epileptiform discharges  Fall at home, initial encounter In setting of syncope Plain films of chest, pelvis, right humerus negative Head CT without acute intracranial abnormality C spine without acute intracranial  abnormality L shoulder plain films negative PT/OT -> recommending snf Workup for syncope, right sided weakness ongoing  Right sided weakness Right sided weakness and tremor MRI brain without acute infarction, moderate to marked chronic microvascular ischemic changes, mild burden of chronic microhemorrhages MRI C spine with severe right and mild L foraminal stneosis with mild to moderate canal stenosis at C4-5.  Moderate right and mild L foraminal stenosis with mild canal stenosis at C5-6.  At c6-7 mild to moderate L>R foraminal stenosis.  At c3-4 mild bilateral foraminal stenosis and mild canal stenosis. Per discussion with neurology, possibly compressive neuropathy from being found down? Improved weakness (since tremor improved after stopping ingrezza) MRI L spine with progressive multilevel lumbar spondylosis, severe canal stenosis with severe bilateral foraminal stenosis at L4-5, moderate to severe R and mild L foraminal stenosis at L5-S1 PT/OT Outpatient neurosurgery for L spine findings  Abnormal EKG No evidence of arrhythmia, appreciate cards   Rhabdomyolysis Elevated CK to >2000 Small Hb, but no RBC's noted Follow CK trend - relatively stable again today, trend IVF, continue  Vaginal bleeding Postmenopausal bleeding Recurrent overnight Follow pelvic ultrasound -> fibroids Will need outpatient follow up  Chronic pain Continue gabapentin  Depression Not taking wellbutrin anymore  History of pulmonary embolism Not currently on any anticoagulation This appears to have been diagnosed in 11/2019 was treated with eliquis at that time  Tardive dyskinesia ingrezza on hold -> tremor has  improved with this -> more notable tardive dyskinesia since holding, she wishes to continue to hold ingrezza  Bipolar 1 disorder ([place]) noted     DVT prophylaxis: resume lovenox and follow Code Status: dnr Family Communication: none Disposition:   Status is: Inpatient Remains  inpatient appropriate because: need for continued workup   Consultants:  cardiology  Procedures:  Echo IMPRESSIONS     1. Left ventricular ejection fraction, by estimation, is 60 to 65%. The  left ventricle has normal function. The left ventricle has no regional  wall motion abnormalities. There is moderate concentric left ventricular  hypertrophy. Left ventricular  diastolic parameters are consistent with Grade I diastolic dysfunction  (impaired relaxation).   2. Right ventricular systolic function is normal. The right ventricular  size is normal. There is normal pulmonary artery systolic pressure. The  estimated right ventricular systolic pressure is 82.4 mmHg.   3. The mitral valve is normal in structure. Trivial mitral valve  regurgitation. No evidence of mitral stenosis.   4. The aortic valve is normal in structure. Aortic valve regurgitation is  not visualized. No aortic stenosis is present.   5. The inferior vena cava is normal in size with greater than 50%  respiratory variability, suggesting right atrial pressure of 3 mmHg.  Antimicrobials:  Anti-infectives (From admission, onward)    None       Subjective: Asking for help with purwick  Objective: Vitals:   04/24/22 2326 04/25/22 0300 04/25/22 0500 04/25/22 0744  BP: 134/78 139/84  (!) 142/93  Pulse:  (!) 59  74  Resp: '20 20  17  '$ Temp: 98.8 F (37.1 C) 98 F (36.7 C)  98.6 F (37 C)  TempSrc: Oral Oral  Oral  SpO2: 92% 94%  94%  Weight:   80 kg   Height:        Intake/Output Summary (Last 24 hours) at 04/25/2022 1606 Last data filed at 04/25/2022 1413 Gross per 24 hour  Intake 0 ml  Output 900 ml  Net -900 ml   Filed Weights   04/23/22 2312 04/24/22 0408 04/25/22 0500  Weight: 77.9 kg 77.9 kg 80 kg    Examination:  General: No acute distress. Cardiovascular: RRR Lungs: unlabored Neurological: improved tremor, presistent R sided weakness 4/5 Extremities: No clubbing or cyanosis. No  edema.  Data Reviewed: I have personally reviewed following labs and imaging studies  CBC: Recent Labs  Lab 04/20/22 1137 04/20/22 1259 04/21/22 0413 04/21/22 1334 04/22/22 0039 04/23/22 0121 04/24/22 0934  WBC 5.2 14.1* 8.5  --  7.5 6.6 4.6  NEUTROABS 4.4  --   --   --  4.1 3.8 2.4  HGB 4.7* 14.2 12.4 13.0 11.9* 11.5* 12.3  HCT 14.6* 41.4 37.4 41.0 35.1* 34.1* 37.1  MCV 96.7 89.2 91.9  --  89.1 89.3 89.6  PLT 79* 227 204  --  213 175 235    Basic Metabolic Panel: Recent Labs  Lab 04/21/22 0413 04/22/22 0039 04/23/22 0121 04/24/22 0934 04/25/22 1147  NA 143 144 142 145 142  K 3.0* 3.1* 3.6 3.6 4.1  CL 115* 113* 114* 109 108  CO2 '22 25 24 25 25  '$ GLUCOSE 92 99 99 137* 96  BUN '13 16 11 8 9  '$ CREATININE 1.00 1.07* 0.80 0.76 0.72  CALCIUM 9.0 8.9 8.4* 9.2 9.0  MG  --  1.9 1.8 1.5* 2.1  PHOS  --  2.9 2.5 3.1  --     GFR: Estimated Creatinine Clearance: 69.8 mL/min (  by C-G formula based on SCr of 0.72 mg/dL).  Liver Function Tests: Recent Labs  Lab 04/21/22 0413 04/22/22 0039 04/23/22 0121 04/24/22 0934 04/25/22 1147  AST 74* 72* 57* 64* 55*  ALT 25 32 34 35 36  ALKPHOS 45 42 40 44 44  BILITOT 0.7 0.4 0.4 0.5 0.6  PROT 5.5* 5.2* 4.9* 5.2* 5.4*  ALBUMIN 3.0* 2.7* 2.5* 2.6* 2.8*    CBG: Recent Labs  Lab 04/20/22 1123  GLUCAP 108*     No results found for this or any previous visit (from the past 240 hour(s)).       Radiology Studies: MR LUMBAR SPINE WO CONTRAST  Result Date: 04/23/2022 CLINICAL DATA:  Right lower extremity weakness EXAM: MRI LUMBAR SPINE WITHOUT CONTRAST TECHNIQUE: Multiplanar, multisequence MR imaging of the lumbar spine was performed. No intravenous contrast was administered. COMPARISON:  MRI 05/25/2017, CT 11/15/2019 FINDINGS: Technical Note: Despite efforts by the technologist and patient, motion artifact is present on today's exam and could not be eliminated. This reduces exam sensitivity and specificity. Segmentation:   Standard. Alignment: 5 mm grade 1 anterolisthesis L4 on L5, increased from prior. Vertebrae: Chronic mild superior endplate compression deformity of the T12 vertebral body with associated superior endplate Schmorl's node. No residual bone marrow edema the fracture site. Discogenic endplate marrow changes at the L5-S1 level. No acute fracture. No evidence of discitis. No suspicious bone lesion. Conus medullaris and cauda equina: Conus extends to the L1-2 level. Conus appears normal. There is slight bunching of the cauda equina nerve roots above the L4-5 level of stenosis. Paraspinal and other soft tissues: Multifibroid uterus. Disc levels: T12-L1: Unremarkable. L1-L2: Unremarkable. L2-L3: Minimal disc bulge. Mild bilateral facet arthropathy with ligamentum flavum buckling. No canal stenosis. Borderline-mild bilateral foraminal stenosis. No significant interval progression from prior. L3-L4: Minimal disc bulge. Left far lateral annular fissure. Moderate bilateral facet arthropathy with ligamentum flavum buckling. No canal stenosis. Mild bilateral foraminal stenosis, left worse than right. No significant interval progression from prior. L4-L5: Anterolisthesis with disc uncovering and disc bulge. Advanced bilateral facet arthropathy with ligamentum flavum buckling. Severe canal stenosis with severe bilateral foraminal stenosis. Findings progressed from prior. L5-S1: Disc height loss with diffuse disc bulge, eccentric to the right. Mild-moderate bilateral facet arthropathy. Moderate-severe right and mild left foraminal stenosis. No canal stenosis. Findings progressed from prior. IMPRESSION: 1. Progressive multilevel lumbar spondylosis, as above. 2. Severe canal stenosis with severe bilateral foraminal stenosis at L4-L5. 3. Moderate-severe right and mild left foraminal stenosis at L5-S1. Electronically Signed   By: Davina Poke D.O.   On: 04/23/2022 18:35        Scheduled Meds:  enoxaparin (LOVENOX) injection   40 mg Subcutaneous Q24H   gabapentin  400 mg Oral TID   polyvinyl alcohol  2 drop Both Eyes QID   Continuous Infusions:  sodium chloride 150 mL/hr at 04/25/22 0615   sodium chloride       LOS: 5 days    Time spent: over 30 min    Fayrene Helper, MD [facility]   To contact the attending provider between 7A-7P or the covering provider during after hours 7P-7A, please log into the web site [email or web address] and access using universal [place] password for that web site. If you do not have the password, please call the hospital operator.  04/25/2022, 4:06 PM

## 2022-04-25 NOTE — Plan of Care (Signed)
Problem: Education: Goal: Knowledge of General Education information will improve Description: Including pain rating scale, medication(s)/side effects and non-pharmacologic comfort measures 04/25/2022 0658 by Delton See, RN Outcome: Progressing 04/25/2022 0657 by Delton See, RN Outcome: Progressing   Problem: Health Behavior/Discharge Planning: Goal: Ability to manage health-related needs will improve 04/25/2022 0658 by Delton See, RN Outcome: Progressing 04/25/2022 0657 by Delton See, RN Outcome: Progressing   Problem: Clinical Measurements: Goal: Ability to maintain clinical measurements within normal limits will improve 04/25/2022 0658 by Delton See, RN Outcome: Progressing 04/25/2022 0657 by Delton See, RN Outcome: Progressing Goal: Will remain free from infection 04/25/2022 0658 by Delton See, RN Outcome: Progressing 04/25/2022 0657 by Delton See, RN Outcome: Progressing Goal: Diagnostic test results will improve 04/25/2022 0658 by Delton See, RN Outcome: Progressing 04/25/2022 0657 by Delton See, RN Outcome: Progressing Goal: Respiratory complications will improve 04/25/2022 0658 by Delton See, RN Outcome: Progressing 04/25/2022 0657 by Delton See, RN Outcome: Progressing Goal: Cardiovascular complication will be avoided 04/25/2022 0658 by Delton See, RN Outcome: Progressing 04/25/2022 0657 by Delton See, RN Outcome: Progressing   Problem: Activity: Goal: Risk for activity intolerance will decrease 04/25/2022 0658 by Delton See, RN Outcome: Not Progressing 04/25/2022 0657 by Delton See, RN Outcome: Not Progressing   Problem: Nutrition: Goal: Adequate nutrition will be maintained 04/25/2022 0658 by Delton See, RN Outcome: Progressing 04/25/2022 0657 by Delton See, RN Outcome: Progressing   Problem: Coping: Goal: Level of anxiety will decrease 04/25/2022 0658 by Delton See, RN Outcome: Progressing 04/25/2022 0657 by Delton See, RN Outcome:  Progressing   Problem: Elimination: Goal: Will not experience complications related to bowel motility 04/25/2022 0658 by Delton See, RN Outcome: Progressing 04/25/2022 0657 by Delton See, RN Outcome: Progressing Goal: Will not experience complications related to urinary retention 04/25/2022 0658 by Delton See, RN Outcome: Progressing 04/25/2022 0657 by Delton See, RN Outcome: Progressing   Problem: Pain Managment: Goal: General experience of comfort will improve 04/25/2022 0658 by Delton See, RN Outcome: Progressing 04/25/2022 0657 by Delton See, RN Outcome: Progressing   Problem: Safety: Goal: Ability to remain free from injury will improve 04/25/2022 0658 by Delton See, RN Outcome: Progressing 04/25/2022 0657 by Delton See, RN Outcome: Progressing   Problem: Skin Integrity: Goal: Risk for impaired skin integrity will decrease 04/25/2022 0658 by Delton See, RN Outcome: Progressing 04/25/2022 0657 by Delton See, RN Outcome: Progressing   Problem: Education: Goal: Knowledge of General Education information will improve Description: Including pain rating scale, medication(s)/side effects and non-pharmacologic comfort measures 04/25/2022 0658 by Delton See, RN Outcome: Progressing 04/25/2022 0657 by Delton See, RN Outcome: Progressing   Problem: Health Behavior/Discharge Planning: Goal: Ability to manage health-related needs will improve 04/25/2022 0658 by Delton See, RN Outcome: Progressing 04/25/2022 0657 by Delton See, RN Outcome: Progressing   Problem: Clinical Measurements: Goal: Ability to maintain clinical measurements within normal limits will improve 04/25/2022 0658 by Delton See, RN Outcome: Progressing 04/25/2022 0657 by Delton See, RN Outcome: Progressing Goal: Will remain free from infection 04/25/2022 0658 by Delton See, RN Outcome: Progressing 04/25/2022 0657 by Delton See, RN Outcome: Progressing Goal: Diagnostic test results will  improve 04/25/2022 0658 by Delton See, RN Outcome: Progressing 04/25/2022 0657 by Delton See, RN Outcome: Progressing Goal: Respiratory complications will improve 04/25/2022 0658 by Delton See, RN Outcome: Progressing 04/25/2022 0657 by Delton See, RN Outcome: Progressing Goal: Cardiovascular complication will be avoided 04/25/2022 0658 by Delton See, RN Outcome: Progressing 04/25/2022 0657 by Delton See, RN Outcome: Progressing   Problem:  Activity: Goal: Risk for activity intolerance will decrease 04/25/2022 0658 by Delton See, RN Outcome: Progressing 04/25/2022 0657 by Delton See, RN Outcome: Progressing   Problem: Nutrition: Goal: Adequate nutrition will be maintained 04/25/2022 0658 by Delton See, RN Outcome: Progressing 04/25/2022 0657 by Delton See, RN Outcome: Progressing   Problem: Coping: Goal: Level of anxiety will decrease 04/25/2022 0658 by Delton See, RN Outcome: Progressing 04/25/2022 0657 by Delton See, RN Outcome: Progressing   Problem: Elimination: Goal: Will not experience complications related to bowel motility 04/25/2022 0658 by Delton See, RN Outcome: Progressing 04/25/2022 0657 by Delton See, RN Outcome: Progressing Goal: Will not experience complications related to urinary retention 04/25/2022 0658 by Delton See, RN Outcome: Progressing 04/25/2022 0657 by Delton See, RN Outcome: Progressing   Problem: Safety: Goal: Ability to remain free from injury will improve 04/25/2022 0658 by Delton See, RN Outcome: Progressing 04/25/2022 0657 by Delton See, RN Outcome: Progressing

## 2022-04-26 DIAGNOSIS — R55 Syncope and collapse: Secondary | ICD-10-CM | POA: Diagnosis not present

## 2022-04-26 LAB — BASIC METABOLIC PANEL
Anion gap: 6 (ref 5–15)
BUN: 9 mg/dL (ref 8–23)
CO2: 27 mmol/L (ref 22–32)
Calcium: 8.9 mg/dL (ref 8.9–10.3)
Chloride: 109 mmol/L (ref 98–111)
Creatinine, Ser: 0.82 mg/dL (ref 0.44–1.00)
GFR, Estimated: >60 mL/min (ref >60)
Glucose, Bld: 99 mg/dL (ref 70–99)
Potassium: 3.9 mmol/L (ref 3.5–5.1)
Sodium: 142 mmol/L (ref 135–145)

## 2022-04-26 LAB — CK: Total CK: 991 U/L — ABNORMAL HIGH (ref 38–234)

## 2022-04-26 NOTE — Progress Notes (Signed)
Physical Therapy Treatment Patient Details Name: Judy [name] MRN: [id number] DOB: [birth year] Today's Date: 04/26/2022   History of Present Illness 76 y.o. female with medical history significant of bipolar 1, depression, HTN, hx PE, hx tardive dyskinesia and multiple other medical problems admitted for syncope episode.  Found by next door neighbor.    PT Comments    Patient able to progress to ambulation during session (x45 ft with RW and min assist). Patient with slow, shuffling gait. Denied dizziness with sitting and standing BP WNL (157/88, 172/117) however max HR 136 bpm while ambulating with mild dyspnea. Plan for SNF upon discharge remains appropriate as pt not yet independent with mobility.     Recommendations for follow up therapy are one component of a multi-disciplinary discharge planning process, led by the attending physician.  Recommendations may be updated based on patient status, additional functional criteria and insurance authorization.  Follow Up Recommendations  Skilled nursing-short term rehab (<3 hours/day) Can patient physically be transported by private vehicle: Yes   Assistance Recommended at Discharge Frequent or constant Supervision/Assistance  Patient can return home with the following Assistance with cooking/housework;Direct supervision/assist for medications management;Direct supervision/assist for financial management;Assist for transportation;Help with stairs or ramp for entrance;A little help with walking and/or transfers;A little help with bathing/dressing/bathroom   Equipment Recommendations  BSC/3in1    Recommendations for Other Services       Precautions / Restrictions Precautions Precautions: Fall Restrictions Weight Bearing Restrictions: No     Mobility  Bed Mobility Overal bed mobility: Needs Assistance Bed Mobility: Supine to Sit     Supine to sit: HOB elevated, Min guard     General bed mobility comments: close guarding for  safety due to h/o syncope; denied dizziness    Transfers Overall transfer level: Needs assistance Equipment used: Rolling walker (2 wheels) Transfers: Sit to/from Stand, Bed to chair/wheelchair/BSC Sit to Stand: Min assist   Step pivot transfers: Min assist       General transfer comment: min A for power up to standing, vc for hand placement, min A for steadying for stepping to chair    Ambulation/Gait Ambulation/Gait assistance: Min assist Gait Distance (Feet): 45 Feet Assistive device: Rolling walker (2 wheels) Gait Pattern/deviations: Step-to pattern, Decreased stride length, Shuffle, Trunk flexed Gait velocity: very slow Gait velocity interpretation: <1.8 ft/sec, indicate of risk for recurrent falls   General Gait Details: incr time and occasional assist to maneuver RW around obstacles; vc for upright posture and proximity to Rw   Stairs             Wheelchair Mobility    Modified Rankin (Stroke Patients Only)       Balance Overall balance assessment: Needs assistance Sitting-balance support: Feet supported Sitting balance-Leahy Scale: Good     Standing balance support: Reliant on assistive device for balance Standing balance-Leahy Scale: Poor                              Cognition Arousal/Alertness: Awake/alert Behavior During Therapy: Flat affect Overall Cognitive Status: No family/caregiver present to determine baseline cognitive functioning Area of Impairment: Following commands, Problem solving                       Following Commands: Follows one step commands with increased time     Problem Solving: Slow processing General Comments: Unsure of baseline        Exercises General Exercises -  Lower Extremity Ankle Circles/Pumps: AROM, Both, 10 reps Heel Slides: AAROM, Both, 10 reps, Strengthening (assisted flexion; resisted extension)    General Comments General comments (skin integrity, edema, etc.): assisted to [facility]  prior to ambulation; denied dizziness and states she had vertigo prior to her fall/syncope when at home      Pertinent Vitals/Pain Pain Assessment Pain Assessment: No/denies pain    Home Living                          Prior Function            PT Goals (current goals can now be found in the care plan section) Acute Rehab PT Goals Patient Stated Goal: feel less weak Time For Goal Achievement: 05/06/22 Potential to Achieve Goals: Fair Progress towards PT goals: Progressing toward goals    Frequency    Min 2X/week      PT Plan Current plan remains appropriate    Co-evaluation              AM-PAC PT "6 Clicks" Mobility   Outcome Measure  Help needed turning from your back to your side while in a flat bed without using bedrails?: None Help needed moving from lying on your back to sitting on the side of a flat bed without using bedrails?: A Little Help needed moving to and from a bed to a chair (including a wheelchair)?: A Little Help needed standing up from a chair using your arms (e.g., wheelchair or bedside chair)?: A Little Help needed to walk in hospital room?: A Little Help needed climbing 3-5 steps with a railing? : Total 6 Click Score: 17    End of Session Equipment Utilized During Treatment: Gait belt Activity Tolerance: Patient limited by fatigue Patient left: in chair;with call bell/phone within reach;with chair alarm set Nurse Communication: Mobility status PT Visit Diagnosis: Unsteadiness on feet (R26.81);Other abnormalities of gait and mobility (R26.89);Muscle weakness (generalized) (M62.81);History of falling (Z91.81);Difficulty in walking, not elsewhere classified (R26.2);Dizziness and giddiness (R42)     Time: 4627-0350 PT Time Calculation (min) (ACUTE ONLY): 39 min  Charges:  $Gait Training: 23-37 mins                      [place]  Office [phone]    Rexanne Mano 04/26/2022, 11:01  AM

## 2022-04-26 NOTE — Plan of Care (Signed)
  Problem: Education: Goal: Knowledge of General Education information will improve Description: Including pain rating scale, medication(s)/side effects and non-pharmacologic comfort measures Outcome: Progressing   Problem: Health Behavior/Discharge Planning: Goal: Ability to manage health-related needs will improve Outcome: Progressing   Problem: Clinical Measurements: Goal: Ability to maintain clinical measurements within normal limits will improve Outcome: Progressing Goal: Will remain free from infection Outcome: Progressing Goal: Diagnostic test results will improve Outcome: Progressing Goal: Respiratory complications will improve Outcome: Progressing Goal: Cardiovascular complication will be avoided Outcome: Progressing   Problem: Nutrition: Goal: Adequate nutrition will be maintained Outcome: Progressing   Problem: Coping: Goal: Level of anxiety will decrease Outcome: Progressing   Problem: Elimination: Goal: Will not experience complications related to bowel motility Outcome: Progressing Goal: Will not experience complications related to urinary retention Outcome: Progressing   Problem: Pain Managment: Goal: General experience of comfort will improve Outcome: Progressing   Problem: Skin Integrity: Goal: Risk for impaired skin integrity will decrease Outcome: Progressing   Problem: Education: Goal: Knowledge of General Education information will improve Description: Including pain rating scale, medication(s)/side effects and non-pharmacologic comfort measures Outcome: Progressing   Problem: Clinical Measurements: Goal: Ability to maintain clinical measurements within normal limits will improve Outcome: Progressing Goal: Will remain free from infection Outcome: Progressing Goal: Diagnostic test results will improve Outcome: Progressing Goal: Respiratory complications will improve Outcome: Progressing Goal: Cardiovascular complication will be  avoided Outcome: Progressing   Problem: Nutrition: Goal: Adequate nutrition will be maintained Outcome: Progressing

## 2022-04-26 NOTE — Progress Notes (Signed)
Occupational Therapy Treatment Patient Details Name: Judy [name] MRN: [id number] DOB: [birth year] Today's Date: 04/26/2022   History of present illness 76 y.o. female with medical history significant of bipolar 1, depression, HTN, hx PE, hx tardive dyskinesia and multiple other medical problems admitted for syncope episode.  Found by next door neighbor.   OT comments  Pt asking to return to bed. Min guard assist to stand and transfer to [facility] and then to bed with RW. Min assist for LEs back into bed. Washed hands with set up in sitting.  Set up for lunch with HOB up. Pt progressing steadily.    Recommendations for follow up therapy are one component of a multi-disciplinary discharge planning process, led by the attending physician.  Recommendations may be updated based on patient status, additional functional criteria and insurance authorization.    Follow Up Recommendations  Skilled nursing-short term rehab (<3 hours/day)    Assistance Recommended at Discharge Frequent or constant Supervision/Assistance  Patient can return home with the following  Help with stairs or ramp for entrance;Assistance with cooking/housework;Assist for transportation;Direct supervision/assist for medications management;A lot of help with bathing/dressing/bathroom;A lot of help with walking and/or transfers   Equipment Recommendations  None recommended by OT    Recommendations for Other Services      Precautions / Restrictions Precautions Precautions: Fall Restrictions Weight Bearing Restrictions: No       Mobility Bed Mobility Overal bed mobility: Needs Assistance Bed Mobility: Sit to Supine       Sit to supine: Min assist   General bed mobility comments: very minimal assist to return LEs to bed    Transfers Overall transfer level: Needs assistance Equipment used: Rolling walker (2 wheels) Transfers: Sit to/from Stand, Bed to chair/wheelchair/BSC Sit to Stand: Min guard     Step pivot  transfers: Min guard     General transfer comment: increased time     Balance Overall balance assessment: Needs assistance   Sitting balance-Leahy Scale: Good     Standing balance support: Reliant on assistive device for balance Standing balance-Leahy Scale: Poor                             ADL either performed or assessed with clinical judgement   ADL Overall ADL's : Needs assistance/impaired Eating/Feeding: Set up;Sitting Eating/Feeding Details (indicate cue type and reason): assist to open containers Grooming: Wash/dry hands;Sitting;Set up           Upper Body Dressing : Supervision/safety;Sitting Upper Body Dressing Details (indicate cue type and reason): doffed front opening gown     Toilet Transfer: Min guard;Stand-pivot;BSC/3in1;Rolling walker (2 wheels)   Toileting- Clothing Manipulation and Hygiene: Minimal assistance;Sit to/from stand              Extremity/Trunk Assessment              Vision       Perception     Praxis      Cognition Arousal/Alertness: Awake/alert Behavior During Therapy: WFL for tasks assessed/performed Overall Cognitive Status: Within Functional Limits for tasks assessed                                          Exercises      Shoulder Instructions       General Comments      Pertinent Vitals/ Pain  Pain Assessment Pain Assessment: No/denies pain  Home Living                                          Prior Functioning/Environment              Frequency  Min 2X/week        Progress Toward Goals  OT Goals(current goals can now be found in the care plan section)  Progress towards OT goals: Progressing toward goals  Acute Rehab OT Goals OT Goal Formulation: With patient Time For Goal Achievement: 05/05/22 Potential to Achieve Goals: [place] Discharge plan remains appropriate    Co-evaluation                 AM-PAC OT "6 Clicks"  Daily Activity     Outcome Measure   Help from another person eating meals?: None Help from another person taking care of personal grooming?: A Little Help from another person toileting, which includes using toliet, bedpan, or urinal?: A Little Help from another person bathing (including washing, rinsing, drying)?: A Lot Help from another person to put on and taking off regular upper body clothing?: None Help from another person to put on and taking off regular lower body clothing?: A Lot 6 Click Score: 18    End of Session Equipment Utilized During Treatment: Rolling walker (2 wheels)  OT Visit Diagnosis: Unsteadiness on feet (R26.81);Muscle weakness (generalized) (M62.81);History of falling (Z91.81)   Activity Tolerance Patient tolerated treatment well   Patient Left in bed;with call bell/phone within reach;with bed alarm set   Nurse Communication Other (comment) (NT- purewick out)        Time: 0762-2633 OT Time Calculation (min): 15 min  Charges: OT General Charges $OT Visit: 1 Visit OT Treatments $Self Care/Home Management : 8-22 mins  Cleta Alberts, OTR/L Acute Rehabilitation Services Office: [phone]   Malka So 04/26/2022, 2:39 PM

## 2022-04-26 NOTE — Progress Notes (Signed)
PROGRESS NOTE    Judy [name]  FAO:[id number] DOB: 06-17-1946 DOA: 04/20/2022 PCP: [name], FNP  Chief Complaint  Patient presents with   Loss of Consciousness    Brief Narrative:  Judy [name] is Judy [name] 76 y.o. female with medical history significant of bipolar 1, depression, HTN, hx PE, hx tardive dyskinesia and multiple other medical problems who reportedly went to the bathroom yesterday morning and passed out.  She woke up this morning on the floor covered in fecal matter.  Reportedly she reported R sided weakness in the right side before she fell.     She reports she was going to the bathroom and then passed out.  She's unable to tell me whether she had R sided weakness before the LOC, but this was reported in the ED triage note.  She did not regain consciousness until this morning when she activated her medic alert button.  She was covered in feces and urine when whe was found.  She was able to walk [name] few steps with EMS, but continues to have right sided weakness.  She typically walks without assistance.  She denies fevers.  Notes chills.  Denies chest pain or SOB, denies nausea.     ED Course: imaging.  MRI brain without stroke.  MRI spine pending.  Admit for R sided weakness, syncope    Assessment & Plan:   Principal Problem:   Syncope Active Problems:   Fall at home, initial encounter   Right sided weakness   Rhabdomyolysis   Abnormal EKG   Chronic pain   Vaginal bleeding   Depression   History of pulmonary embolism   Bipolar 1 disorder (HCC)   Tardive dyskinesia   Assessment and Plan: * Syncope Passed out, LOC reported from 7/10-11? Check orthostatics negative Echo with EF 69-62%, grade 1 diastolic dysfunction EEG without seizures or epileptiform discharges  Fall at home, initial encounter In setting of syncope Plain films of chest, pelvis, right humerus negative Head CT without acute intracranial abnormality C spine without acute intracranial  abnormality L shoulder plain films negative PT/OT -> recommending snf Workup for syncope, right sided weakness as above/below  Right sided weakness Right sided weakness and tremor MRI brain without acute infarction, moderate to marked chronic microvascular ischemic changes, mild burden of chronic microhemorrhages MRI C spine with severe right and mild L foraminal stneosis with mild to moderate canal stenosis at C4-5.  Moderate right and mild L foraminal stenosis with mild canal stenosis at C5-6.  At c6-7 mild to moderate L>R foraminal stenosis.  At c3-4 mild bilateral foraminal stenosis and mild canal stenosis. Per discussion with neurology, possibly compressive neuropathy from being found down (she said she was down for prolonged period on the R side)? Improved weakness (since tremor improved after stopping ingrezza) MRI L spine with progressive multilevel lumbar spondylosis, severe canal stenosis with severe bilateral foraminal stenosis at L4-5, moderate to severe R and mild L foraminal stenosis at L5-S1 PT/OT Outpatient neurosurgery for L spine findings  Abnormal EKG No evidence of arrhythmia, appreciate cards   Rhabdomyolysis Elevated CK to >2000 Small Hb, but no RBC's noted CK trending down  Vaginal bleeding Postmenopausal bleeding Recurrent overnight Follow pelvic ultrasound -> fibroids Will need outpatient follow up  Chronic pain Continue gabapentin  Depression Not taking wellbutrin anymore  History of pulmonary embolism Not currently on any anticoagulation This appears to have been diagnosed in 11/2019 was treated with eliquis at that time  Tardive dyskinesia ingrezza on hold ->  tremor has improved with this -> more notable tardive dyskinesia since holding, she wishes to continue to hold ingrezza  Bipolar 1 disorder ([place]) noted     DVT prophylaxis: resume lovenox and follow Code Status: dnr Family Communication: none Disposition:   Status is:  Inpatient Remains inpatient appropriate because: need for continued workup   Consultants:  cardiology  Procedures:  Echo IMPRESSIONS     1. Left ventricular ejection fraction, by estimation, is 60 to 65%. The  left ventricle has normal function. The left ventricle has no regional  wall motion abnormalities. There is moderate concentric left ventricular  hypertrophy. Left ventricular  diastolic parameters are consistent with Grade I diastolic dysfunction  (impaired relaxation).   2. Right ventricular systolic function is normal. The right ventricular  size is normal. There is normal pulmonary artery systolic pressure. The  estimated right ventricular systolic pressure is 38.7 mmHg.   3. The mitral valve is normal in structure. Trivial mitral valve  regurgitation. No evidence of mitral stenosis.   4. The aortic valve is normal in structure. Aortic valve regurgitation is  not visualized. No aortic stenosis is present.   5. The inferior vena cava is normal in size with greater than 50%  respiratory variability, suggesting right atrial pressure of 3 mmHg.  Antimicrobials:  Anti-infectives (From admission, onward)    None       Subjective: Had vaginal bleeding overnight  Objective: Vitals:   04/26/22 0331 04/26/22 0500 04/26/22 0817 04/26/22 1200  BP: 125/73  (!) 157/88 119/84  Pulse: 65  64 73  Resp: 18  17 (!) 22  Temp: 98.2 F (36.8 C)  98.3 F (36.8 C) 98 F (36.7 C)  TempSrc: Oral  Oral Oral  SpO2:   96%   Weight:  80.2 kg    Height:        Intake/Output Summary (Last 24 hours) at 04/26/2022 1736 Last data filed at 04/26/2022 1307 Gross per 24 hour  Intake --  Output 2300 ml  Net -2300 ml   Filed Weights   04/24/22 0408 04/25/22 0500 04/26/22 0500  Weight: 77.9 kg 80 kg 80.2 kg    Examination:  General: No acute distress. Cardiovascular: RRR Lungs: unlabored Neurological: R sided weakness (she thinks gradually improving) Extremities: No clubbing or  cyanosis. No edema.  Data Reviewed: I have personally reviewed following labs and imaging studies  CBC: Recent Labs  Lab 04/20/22 1137 04/20/22 1259 04/21/22 0413 04/21/22 1334 04/22/22 0039 04/23/22 0121 04/24/22 0934  WBC 5.2 14.1* 8.5  --  7.5 6.6 4.6  NEUTROABS 4.4  --   --   --  4.1 3.8 2.4  HGB 4.7* 14.2 12.4 13.0 11.9* 11.5* 12.3  HCT 14.6* 41.4 37.4 41.0 35.1* 34.1* 37.1  MCV 96.7 89.2 91.9  --  89.1 89.3 89.6  PLT 79* 227 204  --  213 175 564    Basic Metabolic Panel: Recent Labs  Lab 04/22/22 0039 04/23/22 0121 04/24/22 0934 04/25/22 1147 04/26/22 0316  NA 144 142 145 142 142  K 3.1* 3.6 3.6 4.1 3.9  CL 113* 114* 109 108 109  CO2 '25 24 25 25 27  '$ GLUCOSE 99 99 137* 96 99  BUN '16 11 8 9 9  '$ CREATININE 1.07* 0.80 0.76 0.72 0.82  CALCIUM 8.9 8.4* 9.2 9.0 8.9  MG 1.9 1.8 1.5* 2.1  --   PHOS 2.9 2.5 3.1  --   --     GFR: Estimated Creatinine Clearance: 68.2 mL/min (  by C-G formula based on SCr of 0.82 mg/dL).  Liver Function Tests: Recent Labs  Lab 04/21/22 0413 04/22/22 0039 04/23/22 0121 04/24/22 0934 04/25/22 1147  AST 74* 72* 57* 64* 55*  ALT 25 32 34 35 36  ALKPHOS 45 42 40 44 44  BILITOT 0.7 0.4 0.4 0.5 0.6  PROT 5.5* 5.2* 4.9* 5.2* 5.4*  ALBUMIN 3.0* 2.7* 2.5* 2.6* 2.8*    CBG: Recent Labs  Lab 04/20/22 1123  GLUCAP 108*     No results found for this or any previous visit (from the past 240 hour(s)).       Radiology Studies: No results found.      Scheduled Meds:  enoxaparin (LOVENOX) injection  40 mg Subcutaneous Q24H   gabapentin  400 mg Oral TID   polyvinyl alcohol  2 drop Both Eyes QID   Continuous Infusions:     LOS: 6 days    Time spent: over 30 min    Fayrene Helper, MD [facility]   To contact the attending provider between 7A-7P or the covering provider during after hours 7P-7A, please log into the web site [email or web address] and access using universal [place] password for that web site. If  you do not have the password, please call the hospital operator.  04/26/2022, 5:36 PM

## 2022-04-26 NOTE — TOC Progression Note (Addendum)
Transition of Care [facility]) - Progression Note    Patient Details  Name: AMAYRANY CAFARO MRN: [id number] Date of Birth: 1946-04-03  Transition of Care [facility]) CM/SW [place], LCSW Phone Number: 04/26/2022, 8:57 AM  Clinical Narrative:    8:57am-Pasrr pending still, CSW will provide SNF bed offers to patient.   12pm-Patient selected [facility]. TOC CMA started insurance authorization process.   Pasrr received and placed on FL2.    Expected Discharge Plan: [place] Barriers to Discharge: Continued Medical Work up  Expected Discharge Plan and Services Expected Discharge Plan: [place] Choice: [place] arrangements for the past 2 months: Single Family Home                                       Social Determinants of Health (SDOH) Interventions    Readmission Risk Interventions     No data to display

## 2022-04-27 DIAGNOSIS — R55 Syncope and collapse: Secondary | ICD-10-CM | POA: Diagnosis not present

## 2022-04-27 LAB — CBC
HCT: 38.7 % (ref 36.0–46.0)
Hemoglobin: 13.1 g/dL (ref 12.0–15.0)
MCH: 30.6 pg (ref 26.0–34.0)
MCHC: 33.9 g/dL (ref 30.0–36.0)
MCV: 90.4 fL (ref 80.0–100.0)
Platelets: 211 10*3/uL (ref 150–400)
RBC: 4.28 MIL/uL (ref 3.87–5.11)
RDW: 14.7 % (ref 11.5–15.5)
WBC: 5.5 10*3/uL (ref 4.0–10.5)
nRBC: 0 % (ref 0.0–0.2)

## 2022-04-27 LAB — BASIC METABOLIC PANEL
Anion gap: 8 (ref 5–15)
BUN: 12 mg/dL (ref 8–23)
CO2: 28 mmol/L (ref 22–32)
Calcium: 9.3 mg/dL (ref 8.9–10.3)
Chloride: 106 mmol/L (ref 98–111)
Creatinine, Ser: 0.78 mg/dL (ref 0.44–1.00)
GFR, Estimated: >60 mL/min (ref >60)
Glucose, Bld: 91 mg/dL (ref 70–99)
Potassium: 3.8 mmol/L (ref 3.5–5.1)
Sodium: 142 mmol/L (ref 135–145)

## 2022-04-27 LAB — CK: Total CK: 484 U/L — ABNORMAL HIGH (ref 38–234)

## 2022-04-27 NOTE — TOC Progression Note (Signed)
Transition of Care [facility]) - Progression Note    Patient Details  Name: Judy [name] MRN: [id number] Date of Birth: [birth year]  Transition of Care [facility]) CM/SW [place], LCSW Phone Number: 04/27/2022, 10:02 AM  Clinical Narrative:    Biochemist, clinical still pending for [facility].   Expected Discharge Plan: [place] Barriers to Discharge: Continued Medical Work up  Expected Discharge Plan and Services Expected Discharge Plan: [place] Choice: [place] arrangements for the past 2 months: Single Family Home                                       Social Determinants of Health (SDOH) Interventions    Readmission Risk Interventions     No data to display

## 2022-04-27 NOTE — Progress Notes (Signed)
Patient's sister, Higinio Plan, called and stated that she did not want her sister going to [facility]. She explained that she would not be authorizing the transfer from [facility] to that particular SNF. The family wishes for the patient to be sent to [facility]. I informed the sister that I would leave a message for the SW and I would also pass this information to the oncoming AM nurse.

## 2022-04-27 NOTE — Progress Notes (Signed)
PROGRESS NOTE    Judy [name]  HFW:[id number] DOB: 14-Mar-1946 DOA: 04/20/2022 PCP: [name], FNP  Chief Complaint  Patient presents with   Loss of Consciousness    Brief Narrative:  Judy [name] is Judy [name] 76 y.o. female with medical history significant of bipolar 1, depression, HTN, hx PE, hx tardive dyskinesia and multiple other medical problems who reportedly went to the bathroom yesterday morning and passed out.  She woke up this morning on the floor covered in fecal matter.  Reportedly she reported R sided weakness in the right side before she fell.     She reports she was going to the bathroom and then passed out.  She's unable to tell me whether she had R sided weakness before the LOC, but this was reported in the ED triage note.  She did not regain consciousness until this morning when she activated her medic alert button.  She was covered in feces and urine when whe was found.  She was able to walk Kalle Bernath few steps with EMS, but continues to have right sided weakness.  She typically walks without assistance.  She denies fevers.  Notes chills.  Denies chest pain or SOB, denies nausea.     She was seen in the ED after being found down with LOC and R sided weakness.  MRI brain negative for acute infarction.  MRI c spine without findings to explain symptoms.  Neurology noted possibly compressive neuropathy after being down on R side overnight.  She was admitted and treated with IVF for elevated CK.  She had prominent R sided tremor that has improved since stopping ingrezza.  She's gradually improved.  MRI of L spine showed progressive multilevel lumbar spondylosis and severe canal stenosis with severe bilateral foraminal stenosis at L4-5 and moderate-severe right and mild L foraminal stenosis at L5-S1.  Plan for outpatient neurosurgery f/u.  See below for additional details    Assessment & Plan:   Principal Problem:   Syncope Active Problems:   Fall at home, initial encounter   Right  sided weakness   Rhabdomyolysis   Abnormal EKG   Chronic pain   Vaginal bleeding   Depression   History of pulmonary embolism   Bipolar 1 disorder ([place])   Tardive dyskinesia   Assessment and Plan: * Syncope Passed out, LOC reported from 7/10-11? Check orthostatics negative Echo with EF 02-77%, grade 1 diastolic dysfunction EEG without seizures or epileptiform discharges Consider outpatient ziopatch or cardiac monitor Seem by cardiology earlier in hospital stay (7/13)  Fall at home, initial encounter In setting of syncope Plain films of chest, pelvis, right humerus negative Head CT without acute intracranial abnormality C spine without acute intracranial abnormality L shoulder plain films negative PT/OT -> recommending snf Workup for syncope, right sided weakness as above/below  Right sided weakness Right sided weakness and tremor MRI brain without acute infarction, moderate to marked chronic microvascular ischemic changes, mild burden of chronic microhemorrhages MRI C spine with severe right and mild L foraminal stneosis with mild to moderate canal stenosis at C4-5.  Moderate right and mild L foraminal stenosis with mild canal stenosis at C5-6.  At c6-7 mild to moderate L>R foraminal stenosis.  At c3-4 mild bilateral foraminal stenosis and mild canal stenosis. Per discussion with neurology, possibly compressive neuropathy from being found down (she said she was down for prolonged period on the R side)? Improved weakness (since tremor improved after stopping ingrezza) MRI L spine with progressive multilevel lumbar spondylosis, severe  canal stenosis with severe bilateral foraminal stenosis at L4-5, moderate to severe R and mild L foraminal stenosis at L5-S1 PT/OT Outpatient neurosurgery for L spine findings  Abnormal EKG No evidence of arrhythmia, appreciate cards   Rhabdomyolysis Elevated CK to >2000 Small Hb, but no RBC's noted CK trending down  Vaginal  bleeding Postmenopausal bleeding Recurrent overnight Follow pelvic ultrasound -> fibroids Will need outpatient follow up  Chronic pain Continue gabapentin  Depression Not taking wellbutrin anymore  History of pulmonary embolism Not currently on any anticoagulation This appears to have been diagnosed in 11/2019 was treated with eliquis at that time  Tardive dyskinesia ingrezza on hold -> tremor has improved with this -> more notable tardive dyskinesia since holding, she wishes to continue to hold ingrezza Follow outpatient with psych  Bipolar 1 disorder ([place]) noted     DVT prophylaxis: lovenox  Code Status: dnr Family Communication: brother 7/18 Disposition:   Status is: Inpatient Remains inpatient appropriate because: need for continued workup   Consultants:  cardiology  Procedures:  Echo IMPRESSIONS     1. Left ventricular ejection fraction, by estimation, is 60 to 65%. The  left ventricle has normal function. The left ventricle has no regional  wall motion abnormalities. There is moderate concentric left ventricular  hypertrophy. Left ventricular  diastolic parameters are consistent with Grade I diastolic dysfunction  (impaired relaxation).   2. Right ventricular systolic function is normal. The right ventricular  size is normal. There is normal pulmonary artery systolic pressure. The  estimated right ventricular systolic pressure is 37.9 mmHg.   3. The mitral valve is normal in structure. Trivial mitral valve  regurgitation. No evidence of mitral stenosis.   4. The aortic valve is normal in structure. Aortic valve regurgitation is  not visualized. No aortic stenosis is present.   5. The inferior vena cava is normal in size with greater than 50%  respiratory variability, suggesting right atrial pressure of 3 mmHg.  Antimicrobials:  Anti-infectives (From admission, onward)    None       Subjective: No more bleeding overnight Feels she's getting  gradually better  Objective: Vitals:   04/27/22 0445 04/27/22 0819 04/27/22 1200 04/27/22 1612  BP: 117/64 (!) 142/76 128/70 123/85  Pulse: 62 64 66 76  Resp: '15 16 20 17  '$ Temp: 98.3 F (36.8 C) 98.5 F (36.9 C) 98.6 F (37 C) 98.4 F (36.9 C)  TempSrc: Oral Oral Oral Oral  SpO2: 96% 97% 96% 96%  Weight:      Height:        Intake/Output Summary (Last 24 hours) at 04/27/2022 1922 Last data filed at 04/27/2022 0400 Gross per 24 hour  Intake --  Output 900 ml  Net -900 ml   Filed Weights   04/24/22 0408 04/25/22 0500 04/26/22 0500  Weight: 77.9 kg 80 kg 80.2 kg    Examination:  General: No acute distress. Cardiovascular: RRR Lungs: unlabored Neurological: continued R sided weakness (leg>arm), tremor improved  Data Reviewed: I have personally reviewed following labs and imaging studies  CBC: Recent Labs  Lab 04/21/22 0413 04/21/22 1334 04/22/22 0039 04/23/22 0121 04/24/22 0934 04/27/22 0930  WBC 8.5  --  7.5 6.6 4.6 5.5  NEUTROABS  --   --  4.1 3.8 2.4  --   HGB 12.4 13.0 11.9* 11.5* 12.3 13.1  HCT 37.4 41.0 35.1* 34.1* 37.1 38.7  MCV 91.9  --  89.1 89.3 89.6 90.4  PLT 204  --  213 175 192  397    Basic Metabolic Panel: Recent Labs  Lab 04/22/22 0039 04/23/22 0121 04/24/22 0934 04/25/22 1147 04/26/22 0316 04/27/22 0930  NA 144 142 145 142 142 142  K 3.1* 3.6 3.6 4.1 3.9 3.8  CL 113* 114* 109 108 109 106  CO2 '25 24 25 25 27 28  '$ GLUCOSE 99 99 137* 96 99 91  BUN '16 11 8 9 9 12  '$ CREATININE 1.07* 0.80 0.76 0.72 0.82 0.78  CALCIUM 8.9 8.4* 9.2 9.0 8.9 9.3  MG 1.9 1.8 1.5* 2.1  --   --   PHOS 2.9 2.5 3.1  --   --   --     GFR: Estimated Creatinine Clearance: 69.9 mL/min (by C-G formula based on SCr of 0.78 mg/dL).  Liver Function Tests: Recent Labs  Lab 04/21/22 0413 04/22/22 0039 04/23/22 0121 04/24/22 0934 04/25/22 1147  AST 74* 72* 57* 64* 55*  ALT 25 32 34 35 36  ALKPHOS 45 42 40 44 44  BILITOT 0.7 0.4 0.4 0.5 0.6  PROT 5.5* 5.2*  4.9* 5.2* 5.4*  ALBUMIN 3.0* 2.7* 2.5* 2.6* 2.8*    CBG: No results for input(s): "GLUCAP" in the last 168 hours.    No results found for this or any previous visit (from the past 240 hour(s)).       Radiology Studies: No results found.      Scheduled Meds:  enoxaparin (LOVENOX) injection  40 mg Subcutaneous Q24H   gabapentin  400 mg Oral TID   polyvinyl alcohol  2 drop Both Eyes QID   Continuous Infusions:     LOS: 7 days    Time spent: over 30 min    Fayrene Helper, MD [facility]   To contact the attending provider between 7A-7P or the covering provider during after hours 7P-7A, please log into the web site [email or web address] and access using universal [place] password for that web site. If you do not have the password, please call the hospital operator.  04/27/2022, 7:22 PM

## 2022-04-28 ENCOUNTER — Ambulatory Visit: Payer: Medicare HMO | Admitting: Physician Assistant

## 2022-04-28 ENCOUNTER — Encounter (HOSPITAL_COMMUNITY): Payer: Self-pay | Admitting: Radiology

## 2022-04-28 DIAGNOSIS — Z86711 Personal history of pulmonary embolism: Secondary | ICD-10-CM | POA: Diagnosis not present

## 2022-04-28 DIAGNOSIS — R531 Weakness: Secondary | ICD-10-CM | POA: Diagnosis not present

## 2022-04-28 DIAGNOSIS — F319 Bipolar disorder, unspecified: Secondary | ICD-10-CM | POA: Diagnosis not present

## 2022-04-28 DIAGNOSIS — G8191 Hemiplegia, unspecified affecting right dominant side: Secondary | ICD-10-CM | POA: Diagnosis not present

## 2022-04-28 DIAGNOSIS — M6282 Rhabdomyolysis: Secondary | ICD-10-CM | POA: Diagnosis not present

## 2022-04-28 DIAGNOSIS — G8929 Other chronic pain: Secondary | ICD-10-CM

## 2022-04-28 DIAGNOSIS — R69 Illness, unspecified: Secondary | ICD-10-CM | POA: Diagnosis not present

## 2022-04-28 DIAGNOSIS — R404 Transient alteration of awareness: Secondary | ICD-10-CM | POA: Diagnosis not present

## 2022-04-28 DIAGNOSIS — M6259 Muscle wasting and atrophy, not elsewhere classified, multiple sites: Secondary | ICD-10-CM | POA: Diagnosis not present

## 2022-04-28 DIAGNOSIS — R9431 Abnormal electrocardiogram [ECG] [EKG]: Secondary | ICD-10-CM | POA: Diagnosis not present

## 2022-04-28 DIAGNOSIS — U071 COVID-19: Secondary | ICD-10-CM | POA: Diagnosis not present

## 2022-04-28 DIAGNOSIS — R262 Difficulty in walking, not elsewhere classified: Secondary | ICD-10-CM | POA: Diagnosis not present

## 2022-04-28 DIAGNOSIS — F3111 Bipolar disorder, current episode manic without psychotic features, mild: Secondary | ICD-10-CM | POA: Diagnosis not present

## 2022-04-28 DIAGNOSIS — A09 Infectious gastroenteritis and colitis, unspecified: Secondary | ICD-10-CM | POA: Diagnosis not present

## 2022-04-28 DIAGNOSIS — W0110XA Fall on same level from slipping, tripping and stumbling with subsequent striking against unspecified object, initial encounter: Secondary | ICD-10-CM | POA: Diagnosis not present

## 2022-04-28 DIAGNOSIS — R55 Syncope and collapse: Secondary | ICD-10-CM | POA: Diagnosis not present

## 2022-04-28 DIAGNOSIS — F329 Major depressive disorder, single episode, unspecified: Secondary | ICD-10-CM | POA: Diagnosis not present

## 2022-04-28 DIAGNOSIS — Z992 Dependence on renal dialysis: Secondary | ICD-10-CM | POA: Diagnosis not present

## 2022-04-28 DIAGNOSIS — M6281 Muscle weakness (generalized): Secondary | ICD-10-CM | POA: Diagnosis not present

## 2022-04-28 DIAGNOSIS — N939 Abnormal uterine and vaginal bleeding, unspecified: Secondary | ICD-10-CM | POA: Diagnosis not present

## 2022-04-28 DIAGNOSIS — R2689 Other abnormalities of gait and mobility: Secondary | ICD-10-CM | POA: Diagnosis not present

## 2022-04-28 DIAGNOSIS — Z7401 Bed confinement status: Secondary | ICD-10-CM | POA: Diagnosis not present

## 2022-04-28 DIAGNOSIS — R6511 Systemic inflammatory response syndrome (SIRS) of non-infectious origin with acute organ dysfunction: Secondary | ICD-10-CM | POA: Diagnosis not present

## 2022-04-28 DIAGNOSIS — R41841 Cognitive communication deficit: Secondary | ICD-10-CM | POA: Diagnosis not present

## 2022-04-28 DIAGNOSIS — R1312 Dysphagia, oropharyngeal phase: Secondary | ICD-10-CM | POA: Diagnosis not present

## 2022-04-28 DIAGNOSIS — M25511 Pain in right shoulder: Secondary | ICD-10-CM | POA: Diagnosis not present

## 2022-04-28 LAB — CBC WITH DIFFERENTIAL/PLATELET
Abs Immature Granulocytes: 0.01 10*3/uL (ref 0.00–0.07)
Basophils Absolute: 0 10*3/uL (ref 0.0–0.1)
Basophils Relative: 1 %
Eosinophils Absolute: 0.2 10*3/uL (ref 0.0–0.5)
Eosinophils Relative: 4 %
HCT: 36.7 % (ref 36.0–46.0)
Hemoglobin: 12.2 g/dL (ref 12.0–15.0)
Immature Granulocytes: 0 %
Lymphocytes Relative: 38 %
Lymphs Abs: 1.9 10*3/uL (ref 0.7–4.0)
MCH: 30.2 pg (ref 26.0–34.0)
MCHC: 33.2 g/dL (ref 30.0–36.0)
MCV: 90.8 fL (ref 80.0–100.0)
Monocytes Absolute: 0.6 10*3/uL (ref 0.1–1.0)
Monocytes Relative: 11 %
Neutro Abs: 2.3 10*3/uL (ref 1.7–7.7)
Neutrophils Relative %: 46 %
Platelets: 218 10*3/uL (ref 150–400)
RBC: 4.04 MIL/uL (ref 3.87–5.11)
RDW: 14.6 % (ref 11.5–15.5)
WBC: 5.1 10*3/uL (ref 4.0–10.5)
nRBC: 0 % (ref 0.0–0.2)

## 2022-04-28 LAB — COMPREHENSIVE METABOLIC PANEL
ALT: 29 U/L (ref 0–44)
AST: 35 U/L (ref 15–41)
Albumin: 2.7 g/dL — ABNORMAL LOW (ref 3.5–5.0)
Alkaline Phosphatase: 44 U/L (ref 38–126)
Anion gap: 10 (ref 5–15)
BUN: 16 mg/dL (ref 8–23)
CO2: 28 mmol/L (ref 22–32)
Calcium: 9.3 mg/dL (ref 8.9–10.3)
Chloride: 107 mmol/L (ref 98–111)
Creatinine, Ser: 0.83 mg/dL (ref 0.44–1.00)
GFR, Estimated: >60 mL/min (ref >60)
Glucose, Bld: 87 mg/dL (ref 70–99)
Potassium: 3.7 mmol/L (ref 3.5–5.1)
Sodium: 145 mmol/L (ref 135–145)
Total Bilirubin: 0.4 mg/dL (ref 0.3–1.2)
Total Protein: 5.4 g/dL — ABNORMAL LOW (ref 6.5–8.1)

## 2022-04-28 LAB — PHOSPHORUS: Phosphorus: 3.7 mg/dL (ref 2.5–4.6)

## 2022-04-28 LAB — MAGNESIUM: Magnesium: 2.2 mg/dL (ref 1.7–2.4)

## 2022-04-28 MED ORDER — ACETAMINOPHEN 325 MG PO TABS: 650.0000 mg | ORAL_TABLET | Freq: Four times a day (QID) | ORAL | Status: AC | PRN

## 2022-04-28 NOTE — TOC Transition Note (Addendum)
Transition of Care [facility]) - CM/SW Discharge Note   Patient Details  Name: Judy [name] MRN: [id number] Date of Birth: [birth year]  [facility]) CM/SW Contact:  Benard Halsted, LCSW Phone Number: 04/28/2022, 1:55 PM   Clinical Narrative:    [facility] Anticipated DC date: 04/28/22 Family notified: Brother and sister Transport by: [facility]   CSW provided Medicaid application to patient's brother at bedside.   Per MD patient ready for DC to [facility]. RN to call report prior to discharge ([phone]). RN, patient, patient's family, and facility notified of DC. Discharge Summary and FL2 sent to facility. DC packet on chart. Ambulance transport requested for patient.   CSW will sign off for now as social work intervention is no longer needed. Please consult [place] again if new needs arise.     Final next level of care: Skilled Nursing Facility Barriers to Discharge: Barriers Resolved   Patient Goals and CMS Choice Patient states their goals for this hospitalization and ongoing recovery are:: Rehab CMS Medicare.gov Compare Post Acute Care list provided to:: Patient Choice offered to / list presented to : Patient  Discharge Placement   Existing PASRR number confirmed : 04/28/22          Patient chooses bed at: Other - please specify in the comment section below: [facility]) Patient to be transferred to facility by: [place] Name of family member notified: brother and sister Patient and family notified of of transfer: 04/28/22  Discharge Plan and Services In-house Referral: Clinical Social Work   Post Acute Care Choice: [place]                               Social Determinants of Health (SDOH) Interventions     Readmission Risk Interventions     No data to display

## 2022-04-28 NOTE — TOC Progression Note (Signed)
Transition of Care [facility]) - Progression Note    Patient Details  Name: Judy [name] MRN: [id number] Date of Birth: 1946/05/01  Transition of Care [facility]) CM/SW [place], LCSW Phone Number: 04/28/2022, 8:44 AM  Clinical Narrative:    Insurance approval received for [facility]: effective 7/18 - 7/20 for level 1 SNF, next review should be due 7/21. Cert: [id number]. Tanna Savoy, RN will be reviewer. Her number: [phone]. Fax for clinicals: [phone].  CSW read note from last night stating patient's sister has called and is refusing Madelynn Done with request for [place]. [facility] is not in network with [employer] and patient has been medically stable for discharge. Discussing with leadership.    Expected Discharge Plan: [place] Barriers to Discharge: Continued Medical Work up  Expected Discharge Plan and Services Expected Discharge Plan: [place] Choice: [place] arrangements for the past 2 months: Single Family Home                                       Social Determinants of Health (SDOH) Interventions    Readmission Risk Interventions     No data to display

## 2022-04-28 NOTE — Discharge Summary (Signed)
PATIENT DETAILS Name: Judy [name] Age: 76 y.o. Sex: female Date of Birth: 1945/12/05 MRN: [id number]. Admitting Physician: A Melven Sartorius., MD JZP:HXTAV, [name], FNP  Admit Date: 04/20/2022 Discharge date: 04/28/2022  Recommendations for Outpatient Follow-up:  Follow up with PCP in 1-2 weeks Please obtain CMP/CBC in one week Please ensure outpatient follow-up with neurosurgery, GYN.   Admitted From:  Home  Disposition: Skilled nursing facility   Discharge Condition: fair  CODE STATUS:   Code Status: DNR   Diet recommendation:  Diet Order             Diet - low sodium heart healthy           DIET DYS 3 Room service appropriate? Yes; Fluid consistency: Thin  Diet effective now                    Brief Summary: 75 year old with bipolar disorder, HTN, depression, tardive dyskinesia who presented with syncope-right-sided weakness-see below for further details.   Brief Hospital Course: Syncope: Suspicion for orthostatics-Echo stable-EEG negative for seizures-telemetry negative for arrhythmias-cardiology evaluated during this hospital stay.  Consider outpatient Zio patch/monitor if she has another syncopal episode in the future.  Right-sided weakness: Improved-no evidence of CVA on MRI brain.  No acute abnormalities seen on MRI C-spine or LS-spine.  Prior MD-Dr. 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  Thankfully she has improved-recommendations are to discharge to SNF for continued rehab.  Fall/syncope at home: Numerous imaging studies negative for any significant acute traumatic injuries or fractures.  Rhabdomyolysis: Secondary to being down-improved.  HLD: Apparently not taking statins any longer.  In any event they were held due to rhabdomyolysis.  PCP to reassess.  Postmenopausal vaginal bleeding: Pelvic ultrasound showed fibroids-needs outpatient GYN  follow-up.  History of PE: Diagnosed in 2021-treated with Eliquis-no longer on any anticoagulation.  Chronic pain: Continue Neurontin  History of depression/bipolar disorder: Not on Wellbutrin.Patient's primary psychiatrist note-hold Ingrezza-and follow-up with her in the outpatient setting.  Tardive dyskinesia: Tremors improved-prior MD Dr. Florene Glen spoke with psychiatry-continue to hold Ingrezza.  Degenerative disc disease seen on MRI C-spine/MRI LS spine: Likely chronic-probably not related to her fall-patient to follow-up with neurosurgery in the outpatient setting.  BMI: Estimated body mass index is 26.11 kg/m as calculated from the following:   Height as of this encounter: '5\' 9"'$  (1.753 m).   Weight as of this encounter: 80.2 kg.    Discharge Diagnoses:  Principal Problem:   Syncope Active Problems:   Fall at home, initial encounter   Right sided weakness   Rhabdomyolysis   Abnormal EKG   Chronic pain   Vaginal bleeding   Depression   History of pulmonary embolism   Bipolar 1 disorder ([place])   Tardive dyskinesia   Discharge Instructions:  Activity:  As tolerated with Full fall precautions use walker/cane & assistance as needed   Discharge Instructions     Call MD for:  difficulty breathing, headache or visual disturbances   Complete by: As directed    Call MD for:  redness, tenderness, or signs of infection (pain, swelling, redness, odor or green/yellow discharge around incision site)   Complete by: As directed    Call MD for:  severe uncontrolled pain   Complete by: As directed    Diet - low sodium heart healthy   Complete by: As directed    Discharge instructions   Complete by: As directed  Follow with Primary MD  [name], FNP in 1-2 weeks  Please get a complete blood count and chemistry panel checked by your Primary MD at your next visit, and again as instructed by your Primary MD.  Get Medicines reviewed and adjusted: Please take all your  medications with you for your next visit with your Primary MD  Laboratory/radiological data: Please request your Primary MD to go over all hospital tests and procedure/radiological results at the follow up, please ask your Primary MD to get all Hospital records sent to his/her office.  In some cases, they will be blood work, cultures and biopsy results pending at the time of your discharge. Please request that your primary care M.D. follows up on these results.  Also Note the following: If you experience worsening of your admission symptoms, develop shortness of breath, life threatening emergency, suicidal or homicidal thoughts you must seek medical attention immediately by calling 911 or calling your MD immediately  if symptoms less severe.  You must read complete instructions/literature along with all the possible adverse reactions/side effects for all the Medicines you take and that have been prescribed to you. Take any new Medicines after you have completely understood and accpet all the possible adverse reactions/side effects.   Do not drive when taking Pain medications or sleeping medications (Benzodaizepines)  Do not take more than prescribed Pain, Sleep and Anxiety Medications. It is not advisable to combine anxiety,sleep and pain medications without talking with your primary care practitioner  Special Instructions: If you have smoked or chewed Tobacco  in the last 2 yrs please stop smoking, stop any regular Alcohol  and or any Recreational drug use.  Wear Seat belts while driving.  Please note: You were cared for by a hospitalist during your hospital stay. Once you are discharged, your primary care physician will handle any further medical issues. Please note that NO REFILLS for any discharge medications will be authorized once you are discharged, as it is imperative that you return to your primary care physician (or establish a relationship with a primary care physician if you do not  have one) for your post hospital discharge needs so that they can reassess your need for medications and monitor your lab values.   Increase activity slowly   Complete by: As directed       Allergies as of 04/28/2022   No Known Allergies      Medication List     STOP taking these medications    atorvastatin 10 MG tablet Commonly known as: Lipitor   buPROPion 150 MG 24 hr tablet Commonly known as: WELLBUTRIN XL   valbenazine 80 MG capsule Commonly known as: Ingrezza       TAKE these medications    acetaminophen 325 MG tablet Commonly known as: TYLENOL Take 2 tablets (650 mg total) by mouth every 6 (six) hours as needed for mild pain (or Fever >/= 101).   albuterol 108 (90 Base) MCG/ACT inhaler Commonly known as: VENTOLIN HFA Inhale 2 puffs into the lungs every 6 (six) hours as needed for wheezing or shortness of breath.   gabapentin 400 MG capsule Commonly known as: NEURONTIN Take 1 capsule (400 mg total) by mouth 3 (three) times daily.   meclizine 12.5 MG tablet Commonly known as: ANTIVERT Take 1 tablet (12.5 mg total) by mouth 3 (three) times daily as needed for dizziness.        Contact information for follow-up providers     [name], [place]. Schedule an  appointment as soon as possible for a visit in 1 week(s).   Specialty: General Practice Contact information: [address] [place] [address] [phone]              Contact information for after-discharge care     Destination     [facility] Preferred SNF .   Service: Skilled Nursing Contact information: [place] [phone]                    No Known Allergies   Other Procedures/Studies: MR LUMBAR SPINE WO CONTRAST  Result Date: 04/23/2022 CLINICAL DATA:  Right lower extremity weakness EXAM: MRI LUMBAR SPINE WITHOUT CONTRAST TECHNIQUE: Multiplanar, multisequence MR imaging of the lumbar  spine was performed. No intravenous contrast was administered. COMPARISON:  MRI 05/25/2017, CT 11/15/2019 FINDINGS: Technical Note: Despite efforts by the technologist and patient, motion artifact is present on today's exam and could not be eliminated. This reduces exam sensitivity and specificity. Segmentation:  Standard. Alignment: 5 mm grade 1 anterolisthesis L4 on L5, increased from prior. Vertebrae: Chronic mild superior endplate compression deformity of the T12 vertebral body with associated superior endplate Schmorl's node. No residual bone marrow edema the fracture site. Discogenic endplate marrow changes at the L5-S1 level. No acute fracture. No evidence of discitis. No suspicious bone lesion. Conus medullaris and cauda equina: Conus extends to the L1-2 level. Conus appears normal. There is slight bunching of the cauda equina nerve roots above the L4-5 level of stenosis. Paraspinal and other soft tissues: Multifibroid uterus. Disc levels: T12-L1: Unremarkable. L1-L2: Unremarkable. L2-L3: Minimal disc bulge. Mild bilateral facet arthropathy with ligamentum flavum buckling. No canal stenosis. Borderline-mild bilateral foraminal stenosis. No significant interval progression from prior. L3-L4: Minimal disc bulge. Left far lateral annular fissure. Moderate bilateral facet arthropathy with ligamentum flavum buckling. No canal stenosis. Mild bilateral foraminal stenosis, left worse than right. No significant interval progression from prior. L4-L5: Anterolisthesis with disc uncovering and disc bulge. Advanced bilateral facet arthropathy with ligamentum flavum buckling. Severe canal stenosis with severe bilateral foraminal stenosis. Findings progressed from prior. L5-S1: Disc height loss with diffuse disc bulge, eccentric to the right. Mild-moderate bilateral facet arthropathy. Moderate-severe right and mild left foraminal stenosis. No canal stenosis. Findings progressed from prior. IMPRESSION: 1. Progressive  multilevel lumbar spondylosis, as above. 2. Severe canal stenosis with severe bilateral foraminal stenosis at L4-L5. 3. Moderate-severe right and mild left foraminal stenosis at L5-S1. Electronically Signed   By: Davina Poke D.O.   On: 04/23/2022 18:35   US PELVIS (TRANSABDOMINAL ONLY)  Result Date: 04/22/2022 CLINICAL DATA:  Vaginal bleeding EXAM: TRANSABDOMINAL ULTRASOUND OF PELVIS TECHNIQUE: Transabdominal ultrasound examination of the pelvis was performed including evaluation of the uterus, ovaries, adnexal regions, and pelvic cul-de-sac. COMPARISON:  None Available. FINDINGS: Uterus Measurements: 12.3 x 6.1 x 7.0 cm = volume: 272 mL. 5.1 cm right fundal fibroid. 4.7 cm left fundal fibroid. Endometrium Thickness: 4 mm in thickness.  No focal abnormality visualized. Right ovary Measurements: Not visualized.  No adnexal mass seen. Left ovary Measurements: Not visualized.  No adnexal mass seen. Other findings: No abnormal free fluid. Patient refused transvaginal imaging. IMPRESSION: Uterine fibroids. No acute findings. Electronically Signed   By: Rolm Baptise M.D.   On: 04/22/2022 00:06   ECHOCARDIOGRAM COMPLETE  Result Date: 04/21/2022    ECHOCARDIOGRAM REPORT   Patient Name:   LONETTE STEVISON Date of Exam: 04/21/2022 Medical Rec #:  [id number]  Height:       69.0 in Accession #:    [id number]      Weight:       172.0 lb Date of Birth:  07-26-1946       BSA:          1.938 m Patient Age:    24 years        BP:           125/58 mmHg Patient Gender: F               HR:           80 bpm. Exam Location:  Inpatient Procedure: 2D Echo, Cardiac Doppler and Color Doppler Indications:    Syncope  History:        Patient has prior history of Echocardiogram examinations. Risk                 Factors:Hypertension.  Sonographer:    Jyl Heinz Referring Phys: [place]  1. Left ventricular ejection fraction, by estimation, is 60 to 65%. The left ventricle has normal function. The  left ventricle has no regional wall motion abnormalities. There is moderate concentric left ventricular hypertrophy. Left ventricular diastolic parameters are consistent with Grade I diastolic dysfunction (impaired relaxation).  2. Right ventricular systolic function is normal. The right ventricular size is normal. There is normal pulmonary artery systolic pressure. The estimated right ventricular systolic pressure is 09.8 mmHg.  3. The mitral valve is normal in structure. Trivial mitral valve regurgitation. No evidence of mitral stenosis.  4. The aortic valve is normal in structure. Aortic valve regurgitation is not visualized. No aortic stenosis is present.  5. The inferior vena cava is normal in size with greater than 50% respiratory variability, suggesting right atrial pressure of 3 mmHg. FINDINGS  Left Ventricle: Left ventricular ejection fraction, by estimation, is 60 to 65%. The left ventricle has normal function. The left ventricle has no regional wall motion abnormalities. The left ventricular internal cavity size was normal in size. There is  moderate concentric left ventricular hypertrophy. Left ventricular diastolic parameters are consistent with Grade I diastolic dysfunction (impaired relaxation). Normal left ventricular filling pressure. Right Ventricle: The right ventricular size is normal. No increase in right ventricular wall thickness. Right ventricular systolic function is normal. There is normal pulmonary artery systolic pressure. The tricuspid regurgitant velocity is 2.59 m/s, and  with an assumed right atrial pressure of 3 mmHg, the estimated right ventricular systolic pressure is 11.9 mmHg. Left Atrium: Left atrial size was normal in size. Right Atrium: Right atrial size was normal in size. Pericardium: Trivial pericardial effusion is present. The pericardial effusion is circumferential. Mitral Valve: The mitral valve is normal in structure. Trivial mitral valve regurgitation. No evidence of  mitral valve stenosis. Tricuspid Valve: The tricuspid valve is normal in structure. Tricuspid valve regurgitation is trivial. No evidence of tricuspid stenosis. Aortic Valve: The aortic valve is normal in structure. Aortic valve regurgitation is not visualized. No aortic stenosis is present. Aortic valve peak gradient measures 5.8 mmHg. Pulmonic Valve: The pulmonic valve was normal in structure. Pulmonic valve regurgitation is not visualized. No evidence of pulmonic stenosis. Aorta: The aortic root is normal in size and structure. Venous: The inferior vena cava is normal in size with greater than 50% respiratory variability, suggesting right atrial pressure of 3 mmHg. IAS/Shunts: No atrial level shunt detected by color flow Doppler.  LEFT VENTRICLE PLAX 2D LVIDd:  4.40 cm     Diastology LVIDs:         2.90 cm     LV e' medial:    5.00 cm/s LV PW:         1.40 cm     LV E/e' medial:  9.4 LV IVS:        1.40 cm     LV e' lateral:   7.72 cm/s LVOT diam:     2.00 cm     LV E/e' lateral: 6.1 LV SV:         57 LV SV Index:   29 LVOT Area:     3.14 cm  LV Volumes (MOD) LV vol d, MOD A2C: 71.7 ml LV vol d, MOD A4C: 93.5 ml LV vol s, MOD A2C: 29.8 ml LV vol s, MOD A4C: 34.1 ml LV SV MOD A2C:     41.9 ml LV SV MOD A4C:     93.5 ml LV SV MOD BP:      52.9 ml RIGHT VENTRICLE             IVC RV Basal diam:  2.80 cm     IVC diam: 1.40 cm RV Mid diam:    2.30 cm RV S prime:     26.10 cm/s TAPSE (M-mode): 1.9 cm LEFT ATRIUM             Index        RIGHT ATRIUM           Index LA diam:        4.20 cm 2.17 cm/m   RA Area:     13.30 cm LA Vol (A2C):   37.5 ml 19.35 ml/m  RA Volume:   25.60 ml  13.21 ml/m LA Vol (A4C):   59.9 ml 30.92 ml/m LA Biplane Vol: 49.6 ml 25.60 ml/m  AORTIC VALVE AV Area (Vmax): 2.26 cm AV Vmax:        120.00 cm/s AV Peak Grad:   5.8 mmHg LVOT Vmax:      86.30 cm/s LVOT Vmean:     64.400 cm/s LVOT VTI:       0.180 m  AORTA Ao Root diam: 2.70 cm Ao Asc diam:  2.90 cm MITRAL VALVE                TRICUSPID VALVE MV Area (PHT): 3.23 cm    TR Peak grad:   26.8 mmHg MV Decel Time: 235 msec    TR Vmax:        259.00 cm/s MV E velocity: 47.20 cm/s MV A velocity: 83.80 cm/s  SHUNTS MV E/A ratio:  0.56        Systemic VTI:  0.18 m                            Systemic Diam: 2.00 cm Fransico Him MD Electronically signed by Fransico Him MD Signature Date/Time: 04/21/2022/3:24:19 PM    Final    EEG adult  Result Date: 04/21/2022 Lora Havens, MD     04/21/2022  8:44 AM Patient Name: SEIRRA KOS MRN: [id number] Epilepsy Attending: Lora Havens Referring Physician/Provider: Elodia Florence., MD Date: 04/20/2022 Duration: 23.32 mins Patient history: 76 year old female with syncope.  EEG to evaluate for seizure. Level of alertness: Awake, asleep AEDs during EEG study: GBP Technical aspects: This EEG study was done with scalp electrodes positioned according to the 10-20 International system  of electrode placement. Electrical activity was acquired at a sampling rate of '500Hz'$  and reviewed with a high frequency filter of '70Hz'$  and a low frequency filter of '1Hz'$ . EEG data were recorded continuously and digitally stored. Description: The posterior dominant rhythm consists of '8Hz'$  activity of moderate voltage (25-35 uV) seen predominantly in posterior head regions, symmetric and reactive to eye opening and eye closing. Sleep was characterized by vertex waves, sleep spindles (12 to 14 Hz), maximal frontocentral region. Hyperventilation and photic stimulation were not performed.   IMPRESSION: This study is within normal limits. No seizures or epileptiform discharges were seen throughout the recording. Lora Havens   DG Shoulder Left  Result Date: 04/20/2022 CLINICAL DATA:  Syncopal episode.  Fall.  Shoulder pain. EXAM: LEFT SHOULDER - 2+ VIEW COMPARISON:  None Available. FINDINGS: There is no evidence of fracture or dislocation. Mild acromioclavicular degenerative change in small bony spur along the inferior  aspect of the acromion. Mild glenohumeral degenerative change. IMPRESSION: No evidence of acute fracture or joint malalignment. Electronically Signed   By: Margaretha Sheffield M.D.   On: 04/20/2022 18:23   MR Cervical Spine Wo Contrast  Result Date: 04/20/2022 CLINICAL DATA:  Cervical radiculopathy, no red flags EXAM: MRI CERVICAL SPINE WITHOUT CONTRAST TECHNIQUE: Multiplanar, multisequence MR imaging of the cervical spine was performed. No intravenous contrast was administered. COMPARISON:  None Available. FINDINGS: Alignment: No substantial sagittal subluxation. Vertebrae: Vertebral body heights are maintained. No focal marrow edema to suggest acute fracture or discitis/osteomyelitis. No suspicious bone lesion. Cord: Normal cord signal. Posterior Fossa, vertebral arteries, paraspinal tissues: Visualized vertebral artery flow voids are maintained. Unremarkable visualized posterior fossa. Disc levels: C2-C3: Small posterior disc osteophyte complex. No significant canal or foraminal stenosis. C3-C4: Small posterior disc osteophyte complex. Bilateral facet and uncovertebral hypertrophy. Mild bilateral foraminal stenosis. Mild canal stenosis. C4-C5: Posterior disc osteophyte complex with ligamentum flavum thickening and right greater than left facet and uncovertebral hypertrophy. Resulting severe right and mild left foraminal stenosis. Mild to moderate canal stenosis. C5-C6: Posterior disc osteophyte complex with right greater than left facet and uncovertebral hypertrophy. Resulting moderate right and mild left foraminal stenosis. Mild canal stenosis. C6-C7: Posterior disc osteophyte complex with bilateral facet and uncovertebral hypertrophy. Resulting mild-to-moderate left greater than right foraminal stenosis. Patent canal. C7-T1: No significant disc protrusion, foraminal stenosis, or canal stenosis. IMPRESSION: 1. At C4-C5, severe right and mild left foraminal stenosis with mild to moderate canal stenosis. 2. At  C5-C6, moderate right and mild left foraminal stenosis with mild canal stenosis. 3. At C6-C7, mild-to-moderate left greater than right foraminal stenosis. 4. At C3-C4, mild bilateral foraminal stenosis and mild canal stenosis. Electronically Signed   By: Margaretha Sheffield M.D.   On: 04/20/2022 18:11   MR BRAIN WO CONTRAST  Result Date: 04/20/2022 CLINICAL DATA:  Neuro deficit, acute, stroke suspected EXAM: MRI HEAD WITHOUT CONTRAST TECHNIQUE: Multiplanar, multiecho pulse sequences of the brain and surrounding structures were obtained without intravenous contrast. COMPARISON:  2018 FINDINGS: Brain: There is no acute infarction or intracranial hemorrhage. There is no intracranial mass, mass effect, or edema. There is no hydrocephalus or extra-axial fluid collection. Ventricles and sulci are within normal limits in size and configuration. Patchy and confluent areas of T2 hyperintensity in the supratentorial and pontine white matter are nonspecific but probably reflect moderate to marked chronic microvascular ischemic changes. There are prominent perivascular spaces and likely some superimposed chronic small vessel infarcts of the central white matter and deep gray nuclei. Few scattered punctate  foci of susceptibility likely reflect chronic microhemorrhages with a distribution suggesting hypertension as the underlying etiology. Vascular: Major vessel flow voids at the skull base are preserved. Skull and upper cervical spine: Normal marrow signal is preserved. Sinuses/Orbits: Mild mucosal thickening.  Orbits are unremarkable. Other: Sella is unremarkable.  Mastoid air cells are clear. IMPRESSION: No acute infarction, hemorrhage, or mass. Moderate to marked chronic microvascular ischemic changes. Mild burden of chronic microhemorrhages likely secondary to hypertension. Electronically Signed   By: Macy Mis M.D.   On: 04/20/2022 13:37   CT Head Wo Contrast  Result Date: 04/20/2022 CLINICAL DATA:  Neck trauma  EXAM: CT HEAD WITHOUT CONTRAST CT CERVICAL SPINE WITHOUT CONTRAST TECHNIQUE: Multidetector CT imaging of the head and cervical spine was performed following the standard protocol without intravenous contrast. Multiplanar CT image reconstructions of the cervical spine were also generated. RADIATION DOSE REDUCTION: This exam was performed according to the departmental dose-optimization program which includes automated exposure control, adjustment of the mA and/or kV according to patient size and/or use of iterative reconstruction technique. COMPARISON:  Head and cervical spine dated July 07, 2018 FINDINGS: CT HEAD FINDINGS Brain: Chronic white matter ischemic change. No evidence of acute infarction, hemorrhage, hydrocephalus, extra-axial collection or mass lesion/mass effect. Vascular: No hyperdense vessel or unexpected calcification. Skull: Normal. Negative for fracture or focal lesion. Sinuses/Orbits: No acute finding. Other: None. CT CERVICAL SPINE FINDINGS Alignment: Normal. Skull base and vertebrae: No acute fracture. No primary bone lesion or focal pathologic process. Soft tissues and spinal canal: No prevertebral fluid or swelling. No visible canal hematoma. Disc levels: Moderate multilevel degenerative disc disease, unchanged when compared to prior exam. Upper chest: Negative. Other: None. IMPRESSION: 1. No acute intracranial abnormality. 2. No evidence of acute cervical spine fracture or traumatic malalignment. Electronically Signed   By: Yetta Glassman M.D.   On: 04/20/2022 11:33   CT Cervical Spine Wo Contrast  Result Date: 04/20/2022 CLINICAL DATA:  Neck trauma EXAM: CT HEAD WITHOUT CONTRAST CT CERVICAL SPINE WITHOUT CONTRAST TECHNIQUE: Multidetector CT imaging of the head and cervical spine was performed following the standard protocol without intravenous contrast. Multiplanar CT image reconstructions of the cervical spine were also generated. RADIATION DOSE REDUCTION: This exam was performed  according to the departmental dose-optimization program which includes automated exposure control, adjustment of the mA and/or kV according to patient size and/or use of iterative reconstruction technique. COMPARISON:  Head and cervical spine dated July 07, 2018 FINDINGS: CT HEAD FINDINGS Brain: Chronic white matter ischemic change. No evidence of acute infarction, hemorrhage, hydrocephalus, extra-axial collection or mass lesion/mass effect. Vascular: No hyperdense vessel or unexpected calcification. Skull: Normal. Negative for fracture or focal lesion. Sinuses/Orbits: No acute finding. Other: None. CT CERVICAL SPINE FINDINGS Alignment: Normal. Skull base and vertebrae: No acute fracture. No primary bone lesion or focal pathologic process. Soft tissues and spinal canal: No prevertebral fluid or swelling. No visible canal hematoma. Disc levels: Moderate multilevel degenerative disc disease, unchanged when compared to prior exam. Upper chest: Negative. Other: None. IMPRESSION: 1. No acute intracranial abnormality. 2. No evidence of acute cervical spine fracture or traumatic malalignment. Electronically Signed   By: Yetta Glassman M.D.   On: 04/20/2022 11:33   DG Pelvis Portable  Result Date: 04/20/2022 CLINICAL DATA:  Provided history: Fall. EXAM: PORTABLE PELVIS 1-2 VIEWS COMPARISON:  Radiographs of the pelvis 07/07/2018. FINDINGS: Internal rotation of the right femur. No appreciable acute fracture. Mild degenerative changes of the bilateral femoroacetabular joints. Lower lumbar spondylosis. IMPRESSION: Internal  rotation of the right femur. No appreciable acute fracture on this single AP view radiograph. Degenerative changes of the bilateral femoroacetabular joints and lower lumbar spine. Electronically Signed   By: Kellie Simmering D.O.   On: 04/20/2022 10:58   DG Humerus Right  Result Date: 04/20/2022 CLINICAL DATA:  [id number] syncope EXAM: RIGHT HUMERUS - 2+ VIEW COMPARISON:  None Available. FINDINGS: Two  views, 3 images of the right humerus were obtained. There is no evidence of fracture or other focal bone lesions. Soft tissues are unremarkable. Mild AC joint degenerative/hypertrophic changes. IMPRESSION: No fracture of the humerus seen. Mild degenerative/hypertrophic changes at the [facility] joint. Electronically Signed   By: Frazier Richards M.D.   On: 04/20/2022 10:56   DG Chest Port 1 View  Result Date: 04/20/2022 CLINICAL DATA:  Provided history: Fall. EXAM: PORTABLE CHEST 1 VIEW COMPARISON:  Prior chest radiographs 11/15/2019 and earlier. Chest CT 11/15/2019. FINDINGS: Heart size within normal limits. No appreciable airspace consolidation or pulmonary edema. No evidence of pleural effusion or pneumothorax. No acute bony abnormality identified. Levocurvature of the thoracic spine, possibly positional. IMPRESSION: No evidence of acute cardiopulmonary abnormality. Electronically Signed   By: Kellie Simmering D.O.   On: 04/20/2022 10:54     TODAY-DAY OF DISCHARGE:  Subjective:   Aleiyah Halpin today has no headache,no chest abdominal pain,no new weakness tingling or numbness, feels much better wants to go home today.   Objective:   Blood pressure 133/77, pulse 60, temperature 98.6 F (37 C), temperature source Oral, resp. rate 16, height '5\' 9"'$  (1.753 m), weight 80.2 kg, SpO2 96 %.  Intake/Output Summary (Last 24 hours) at 04/28/2022 1012 Last data filed at 04/28/2022 0530 Gross per 24 hour  Intake --  Output 2400 ml  Net -2400 ml   Filed Weights   04/24/22 0408 04/25/22 0500 04/26/22 0500  Weight: 77.9 kg 80 kg 80.2 kg    Exam: Awake Alert, Oriented *3, No new F.N deficits, Normal affect [place].AT,PERRAL Supple Neck,No JVD, No cervical lymphadenopathy appriciated.  Symmetrical Chest wall movement, Good air movement bilaterally, CTAB RRR,No Gallops,Rubs or new Murmurs, No Parasternal Heave +ve B.Sounds, Abd Soft, Non tender, No organomegaly appriciated, No rebound -guarding or rigidity. No  Cyanosis, Clubbing or edema, No new Rash or bruise   PERTINENT RADIOLOGIC STUDIES: No results found.   PERTINENT LAB RESULTS: CBC: Recent Labs    04/27/22 0930 04/28/22 0437  WBC 5.5 5.1  HGB 13.1 12.2  HCT 38.7 36.7  PLT 211 218   CMET CMP     Component Value Date/Time   NA 145 04/28/2022 0437   NA 145 (H) 03/02/2022 1539   K 3.7 04/28/2022 0437   CL 107 04/28/2022 0437   CO2 28 04/28/2022 0437   GLUCOSE 87 04/28/2022 0437   BUN 16 04/28/2022 0437   BUN 16 03/02/2022 1539   CREATININE 0.83 04/28/2022 0437   CREATININE 1.16 (H) 03/21/2018 1515   CALCIUM 9.3 04/28/2022 0437   PROT 5.4 (L) 04/28/2022 0437   PROT 7.0 03/02/2022 1539   ALBUMIN 2.7 (L) 04/28/2022 0437   ALBUMIN 4.3 03/02/2022 1539   AST 35 04/28/2022 0437   ALT 29 04/28/2022 0437   ALKPHOS 44 04/28/2022 0437   BILITOT 0.4 04/28/2022 0437   BILITOT 0.3 03/02/2022 1539   GFRNONAA >60 04/28/2022 0437   GFRNONAA 48 (L) 03/21/2018 1515   GFRAA >60 11/20/2019 0314   GFRAA 55 (L) 03/21/2018 1515    GFR Estimated Creatinine Clearance: 67.4 mL/min (by  C-G formula based on SCr of 0.83 mg/dL). No results for input(s): "LIPASE", "AMYLASE" in the last 72 hours. Recent Labs    04/25/22 1147 04/26/22 0316 04/27/22 0930  CKTOTAL 1,471* 991* 484*   Invalid input(s): "POCBNP" No results for input(s): "DDIMER" in the last 72 hours. No results for input(s): "HGBA1C" in the last 72 hours. No results for input(s): "CHOL", "HDL", "LDLCALC", "TRIG", "CHOLHDL", "LDLDIRECT" in the last 72 hours. No results for input(s): "TSH", "T4TOTAL", "T3FREE", "THYROIDAB" in the last 72 hours.  Invalid input(s): "FREET3" No results for input(s): "VITAMINB12", "FOLATE", "FERRITIN", "TIBC", "IRON", "RETICCTPCT" in the last 72 hours. Coags: No results for input(s): "INR" in the last 72 hours.  Invalid input(s): "PT" Microbiology: No results found for this or any previous visit (from the past 240 hour(s)).  FURTHER DISCHARGE  INSTRUCTIONS:  Get Medicines reviewed and adjusted: Please take all your medications with you for your next visit with your Primary MD  Laboratory/radiological data: Please request your Primary MD to go over all hospital tests and procedure/radiological results at the follow up, please ask your Primary MD to get all Hospital records sent to his/her office.  In some cases, they will be blood work, cultures and biopsy results pending at the time of your discharge. Please request that your primary care M.D. goes through all the records of your hospital data and follows up on these results.  Also Note the following: If you experience worsening of your admission symptoms, develop shortness of breath, life threatening emergency, suicidal or homicidal thoughts you must seek medical attention immediately by calling 911 or calling your MD immediately  if symptoms less severe.  You must read complete instructions/literature along with all the possible adverse reactions/side effects for all the Medicines you take and that have been prescribed to you. Take any new Medicines after you have completely understood and accpet all the possible adverse reactions/side effects.   Do not drive when taking Pain medications or sleeping medications (Benzodaizepines)  Do not take more than prescribed Pain, Sleep and Anxiety Medications. It is not advisable to combine anxiety,sleep and pain medications without talking with your primary care practitioner  Special Instructions: If you have smoked or chewed Tobacco  in the last 2 yrs please stop smoking, stop any regular Alcohol  and or any Recreational drug use.  Wear Seat belts while driving.  Please note: You were cared for by a hospitalist during your hospital stay. Once you are discharged, your primary care physician will handle any further medical issues. Please note that NO REFILLS for any discharge medications will be authorized once you are discharged, as it is  imperative that you return to your primary care physician (or establish a relationship with a primary care physician if you do not have one) for your post hospital discharge needs so that they can reassess your need for medications and monitor your lab values.  Total Time spent coordinating discharge including counseling, education and face to face time equals greater than 30 minutes.  SignedOren Binet 04/28/2022 10:12 AM

## 2022-04-28 NOTE — TOC Progression Note (Signed)
Transition of Care [facility]) - Progression Note    Patient Details  Name: Judy [name] MRN: [id number] Date of Birth: October 28, 1945  Transition of Care [facility]) CM/SW [place], LCSW Phone Number: 04/28/2022, 9:59 AM  Clinical Narrative:    [name], and CSW met with patient and made her aware of insurance approval for [place]. Patient stated she would like [place] to call her sister to help with decisions.   CSW contacted patient's sister and she also had patient's brother on the line. CSW explained that patient's insurance is limiting. CSW warned that [place] will likely not approve patient to stay at SNF very long and recommended they contact Social Services to begin a [place] application as they are concerned patient will not be able to return to living alone with no assistance. Patient's brother is coming to bring clothes around 2pm. CSW will schedule PTAR then.    Expected Discharge Plan: [place] Barriers to Discharge: Continued Medical Work up  Expected Discharge Plan and Services Expected Discharge Plan: [place] Choice: [place] arrangements for the past 2 months: Single Family Home                                       Social Determinants of Health (SDOH) Interventions    Readmission Risk Interventions     No data to display

## 2022-04-28 NOTE — Progress Notes (Signed)
Report called to Education administrator at [facility].

## 2022-04-28 NOTE — Progress Notes (Signed)
Attempted x2 to call report to [facility]. Will attempt again prior to [place] arrival.

## 2022-05-06 ENCOUNTER — Inpatient Hospital Stay: Payer: Medicare HMO | Admitting: Nurse Practitioner

## 2022-05-11 DIAGNOSIS — R6511 Systemic inflammatory response syndrome (SIRS) of non-infectious origin with acute organ dysfunction: Secondary | ICD-10-CM | POA: Diagnosis not present

## 2022-05-11 DIAGNOSIS — J810 Acute pulmonary edema: Secondary | ICD-10-CM | POA: Diagnosis not present

## 2022-05-11 DIAGNOSIS — M6282 Rhabdomyolysis: Secondary | ICD-10-CM | POA: Diagnosis not present

## 2022-05-11 DIAGNOSIS — U071 COVID-19: Secondary | ICD-10-CM | POA: Diagnosis not present

## 2022-05-11 DIAGNOSIS — R55 Syncope and collapse: Secondary | ICD-10-CM | POA: Diagnosis not present

## 2022-05-11 DIAGNOSIS — M25511 Pain in right shoulder: Secondary | ICD-10-CM | POA: Diagnosis not present

## 2022-05-11 DIAGNOSIS — I509 Heart failure, unspecified: Secondary | ICD-10-CM | POA: Diagnosis not present

## 2022-05-11 DIAGNOSIS — Z992 Dependence on renal dialysis: Secondary | ICD-10-CM | POA: Diagnosis not present

## 2022-05-11 DIAGNOSIS — S72142S Displaced intertrochanteric fracture of left femur, sequela: Secondary | ICD-10-CM | POA: Diagnosis not present

## 2022-05-11 DIAGNOSIS — R1312 Dysphagia, oropharyngeal phase: Secondary | ICD-10-CM | POA: Diagnosis not present

## 2022-05-11 DIAGNOSIS — M6281 Muscle weakness (generalized): Secondary | ICD-10-CM | POA: Diagnosis not present

## 2022-05-11 DIAGNOSIS — R9431 Abnormal electrocardiogram [ECG] [EKG]: Secondary | ICD-10-CM | POA: Diagnosis not present

## 2022-05-11 DIAGNOSIS — M6259 Muscle wasting and atrophy, not elsewhere classified, multiple sites: Secondary | ICD-10-CM | POA: Diagnosis not present

## 2022-05-11 DIAGNOSIS — I87301 Chronic venous hypertension (idiopathic) without complications of right lower extremity: Secondary | ICD-10-CM | POA: Diagnosis not present

## 2022-05-11 DIAGNOSIS — Z86711 Personal history of pulmonary embolism: Secondary | ICD-10-CM | POA: Diagnosis not present

## 2022-05-11 DIAGNOSIS — A09 Infectious gastroenteritis and colitis, unspecified: Secondary | ICD-10-CM | POA: Diagnosis not present

## 2022-05-11 DIAGNOSIS — G8191 Hemiplegia, unspecified affecting right dominant side: Secondary | ICD-10-CM | POA: Diagnosis not present

## 2022-05-11 DIAGNOSIS — W0110XA Fall on same level from slipping, tripping and stumbling with subsequent striking against unspecified object, initial encounter: Secondary | ICD-10-CM | POA: Diagnosis not present

## 2022-05-11 DIAGNOSIS — R2689 Other abnormalities of gait and mobility: Secondary | ICD-10-CM | POA: Diagnosis not present

## 2022-05-11 DIAGNOSIS — G8929 Other chronic pain: Secondary | ICD-10-CM | POA: Diagnosis not present

## 2022-05-11 DIAGNOSIS — R262 Difficulty in walking, not elsewhere classified: Secondary | ICD-10-CM | POA: Diagnosis not present

## 2022-05-24 DIAGNOSIS — R1312 Dysphagia, oropharyngeal phase: Secondary | ICD-10-CM | POA: Diagnosis not present

## 2022-05-27 ENCOUNTER — Ambulatory Visit: Payer: Medicare HMO | Admitting: Nurse Practitioner

## 2022-05-29 DIAGNOSIS — Z86711 Personal history of pulmonary embolism: Secondary | ICD-10-CM | POA: Diagnosis not present

## 2022-05-29 DIAGNOSIS — I10 Essential (primary) hypertension: Secondary | ICD-10-CM | POA: Diagnosis not present

## 2022-05-29 DIAGNOSIS — E785 Hyperlipidemia, unspecified: Secondary | ICD-10-CM | POA: Diagnosis not present

## 2022-05-29 DIAGNOSIS — Z9181 History of falling: Secondary | ICD-10-CM | POA: Diagnosis not present

## 2022-05-29 DIAGNOSIS — M1990 Unspecified osteoarthritis, unspecified site: Secondary | ICD-10-CM | POA: Diagnosis not present

## 2022-05-29 DIAGNOSIS — M47812 Spondylosis without myelopathy or radiculopathy, cervical region: Secondary | ICD-10-CM | POA: Diagnosis not present

## 2022-05-29 DIAGNOSIS — N289 Disorder of kidney and ureter, unspecified: Secondary | ICD-10-CM | POA: Diagnosis not present

## 2022-05-29 DIAGNOSIS — K5790 Diverticulosis of intestine, part unspecified, without perforation or abscess without bleeding: Secondary | ICD-10-CM | POA: Diagnosis not present

## 2022-05-29 DIAGNOSIS — G8929 Other chronic pain: Secondary | ICD-10-CM | POA: Diagnosis not present

## 2022-05-29 DIAGNOSIS — M47817 Spondylosis without myelopathy or radiculopathy, lumbosacral region: Secondary | ICD-10-CM | POA: Diagnosis not present

## 2022-05-29 DIAGNOSIS — H9192 Unspecified hearing loss, left ear: Secondary | ICD-10-CM | POA: Diagnosis not present

## 2022-05-29 DIAGNOSIS — G8191 Hemiplegia, unspecified affecting right dominant side: Secondary | ICD-10-CM | POA: Diagnosis not present

## 2022-05-29 DIAGNOSIS — G2401 Drug induced subacute dyskinesia: Secondary | ICD-10-CM | POA: Diagnosis not present

## 2022-05-31 ENCOUNTER — Encounter: Payer: Self-pay | Admitting: Nurse Practitioner

## 2022-05-31 ENCOUNTER — Ambulatory Visit (INDEPENDENT_AMBULATORY_CARE_PROVIDER_SITE_OTHER): Payer: Medicare Other | Admitting: Nurse Practitioner

## 2022-05-31 VITALS — BP 128/70 | HR 64 | Temp 98.3°F | Ht 69.0 in | Wt 154.0 lb

## 2022-05-31 DIAGNOSIS — R0609 Other forms of dyspnea: Secondary | ICD-10-CM | POA: Diagnosis not present

## 2022-05-31 DIAGNOSIS — Z86711 Personal history of pulmonary embolism: Secondary | ICD-10-CM | POA: Diagnosis not present

## 2022-05-31 DIAGNOSIS — G2401 Drug induced subacute dyskinesia: Secondary | ICD-10-CM | POA: Diagnosis not present

## 2022-05-31 DIAGNOSIS — E782 Mixed hyperlipidemia: Secondary | ICD-10-CM

## 2022-05-31 DIAGNOSIS — K5790 Diverticulosis of intestine, part unspecified, without perforation or abscess without bleeding: Secondary | ICD-10-CM | POA: Diagnosis not present

## 2022-05-31 DIAGNOSIS — M47812 Spondylosis without myelopathy or radiculopathy, cervical region: Secondary | ICD-10-CM | POA: Diagnosis not present

## 2022-05-31 DIAGNOSIS — G8191 Hemiplegia, unspecified affecting right dominant side: Secondary | ICD-10-CM | POA: Diagnosis not present

## 2022-05-31 DIAGNOSIS — H9192 Unspecified hearing loss, left ear: Secondary | ICD-10-CM | POA: Diagnosis not present

## 2022-05-31 DIAGNOSIS — N289 Disorder of kidney and ureter, unspecified: Secondary | ICD-10-CM | POA: Diagnosis not present

## 2022-05-31 DIAGNOSIS — M47817 Spondylosis without myelopathy or radiculopathy, lumbosacral region: Secondary | ICD-10-CM | POA: Diagnosis not present

## 2022-05-31 DIAGNOSIS — R748 Abnormal levels of other serum enzymes: Secondary | ICD-10-CM | POA: Diagnosis not present

## 2022-05-31 DIAGNOSIS — R42 Dizziness and giddiness: Secondary | ICD-10-CM

## 2022-05-31 DIAGNOSIS — G8929 Other chronic pain: Secondary | ICD-10-CM | POA: Diagnosis not present

## 2022-05-31 DIAGNOSIS — M1990 Unspecified osteoarthritis, unspecified site: Secondary | ICD-10-CM | POA: Diagnosis not present

## 2022-05-31 DIAGNOSIS — W19XXXD Unspecified fall, subsequent encounter: Secondary | ICD-10-CM

## 2022-05-31 DIAGNOSIS — F3175 Bipolar disorder, in partial remission, most recent episode depressed: Secondary | ICD-10-CM

## 2022-05-31 DIAGNOSIS — Z9181 History of falling: Secondary | ICD-10-CM | POA: Diagnosis not present

## 2022-05-31 DIAGNOSIS — E785 Hyperlipidemia, unspecified: Secondary | ICD-10-CM | POA: Diagnosis not present

## 2022-05-31 DIAGNOSIS — I10 Essential (primary) hypertension: Secondary | ICD-10-CM

## 2022-05-31 MED ORDER — MECLIZINE HCL 12.5 MG PO TABS: 12.5000 mg | ORAL_TABLET | Freq: Three times a day (TID) | ORAL | 0 refills | Status: DC | PRN

## 2022-05-31 MED ORDER — POLYETHYLENE GLYCOL 3350 17 G PO PACK: 17.0000 g | PACK | Freq: Every day | ORAL | 2 refills | Status: AC

## 2022-05-31 MED ORDER — GABAPENTIN 400 MG PO CAPS: 400.0000 mg | ORAL_CAPSULE | Freq: Three times a day (TID) | ORAL | 1 refills | Status: DC

## 2022-05-31 MED ORDER — AEROCHAMBER PLUS FLO-VU MEDIUM MISC: 1.0000 | Freq: Once | 0 refills | Status: AC

## 2022-05-31 MED ORDER — ALBUTEROL SULFATE HFA 108 (90 BASE) MCG/ACT IN AERS: 2.0000 | INHALATION_SPRAY | Freq: Four times a day (QID) | RESPIRATORY_TRACT | 2 refills | Status: DC | PRN

## 2022-05-31 NOTE — Progress Notes (Signed)
I,Tianna Badgett,acting as a Education administrator for [employer], FNP.,have documented all relevant documentation on the behalf of [name], FNP,as directed by  [name], FNP while in the presence of [name], [place].  Subjective:     Patient ID: Judy [name] , female    DOB: 1945/12/03 , 76 y.o.   MRN: [id number]   Chief Complaint  Patient presents with   Hospitalization Follow-up    HPI  Patient presents today for hospital follow up from an admission on 04/20/2022-04/28/2022 for syncope and rhabdo, she was discharged to a skilled facility [facility] in which she was discharged last week. She had multiple test done and did not have a CVA but did have some right sided weakness thought to be related to neuropathy after lying on the floor for 2 days. Her statin was held due to the elevated CK levels. She was also stopped off her Ingrezza as her tardive dyskenesia had improved.   She has expressed today she is ready to go to a SNF to live due to her frequent falls and inability to care for herself. She is having difficulty with her ADLs.    She went to [facility] on [address] for rehab.  Her sister has called [place] for skilled.   She has a sone who has dementia and her grandchildren are taking care of him. No one is here to care for her locally. Her brother does live in [place].   Home Health - PT/OT today 2-3p, SN, and HHA - there is an order for Speech therapy as well. Will call [facility] to add services.   Needs wheelchair, shower bench and is supposed to start with meals on wheels today She is taking her medications from the bottle.       Past Medical History:  Diagnosis Date   Allergic rhinitis    Anemia    Anxiety    Arthritis    Bipolar 1 disorder (HCC)    Depression    Diverticulosis of colon (without mention of hemorrhage) 08/25/2011   [name]   Hearing loss in left ear    since birth   Hyperlipidemia    Hypertension    Obesity    Renal  disorder    Vertigo      Family History  Problem Relation Age of Onset   Prostate cancer Brother    Hypertension Brother    Kidney disease Brother    Colon cancer Brother    Hypertension Mother    Kidney disease Mother    Stomach cancer Father    Hyperlipidemia Sister    Hypothyroidism Sister    Stroke Brother    Prostate cancer Brother      Current Outpatient Medications:    Spacer/Aero-Holding Chambers (AEROCHAMBER PLUS FLO-VU MEDIUM) MISC, 1 each by Other route once for 1 dose., Disp: 1 each, Rfl: 0   acetaminophen (TYLENOL) 325 MG tablet, Take 2 tablets (650 mg total) by mouth every 6 (six) hours as needed for mild pain (or Fever >/= 101)., Disp: , Rfl:    albuterol (VENTOLIN HFA) 108 (90 Base) MCG/ACT inhaler, Inhale 2 puffs into the lungs every 6 (six) hours as needed for wheezing or shortness of breath. Use with spacer, Disp: 8 g, Rfl: 2   gabapentin (NEURONTIN) 400 MG capsule, Take 1 capsule (400 mg total) by mouth 3 (three) times daily., Disp: 180 capsule, Rfl: 1   INGREZZA 80 MG capsule, Take 80 mg by mouth daily., Disp: ,  Rfl:    meclizine (ANTIVERT) 12.5 MG tablet, Take 1 tablet (12.5 mg total) by mouth 3 (three) times daily as needed for dizziness., Disp: 30 tablet, Rfl: 0   polyethylene glycol (MIRALAX / GLYCOLAX) 17 g packet, Take 17 g by mouth daily., Disp: 30 each, Rfl: 2   No Known Allergies   Review of Systems  Constitutional: Negative.   Respiratory: Negative.    Cardiovascular: Negative.   Gastrointestinal: Negative.   Neurological: Negative.      Today's Vitals   05/31/22 0907  BP: 128/70  Pulse: 64  Temp: 98.3 F (36.8 C)  TempSrc: Oral  Weight: 154 lb (69.9 kg)  Height: $Remove'5\' 9"'tnCFvUY$  (1.753 m)   Body mass index is 22.74 kg/m.  Wt Readings from Last 3 Encounters:  05/31/22 154 lb (69.9 kg)  04/26/22 176 lb 12.9 oz (80.2 kg)  03/02/22 172 lb (78 kg)    Objective:  Physical Exam Vitals reviewed.  Constitutional:      General: She is not in  acute distress.    Appearance: Normal appearance. She is well-developed.  HENT:     Head: Normocephalic and atraumatic.  Cardiovascular:     Rate and Rhythm: Normal rate and regular rhythm.     Pulses: Normal pulses.     Heart sounds: Normal heart sounds. No murmur heard. Pulmonary:     Effort: Pulmonary effort is normal. No respiratory distress.     Breath sounds: Normal breath sounds. No wheezing.  Musculoskeletal:        General: No swelling. Normal range of motion.     Comments: Using a cane today  Skin:    General: Skin is warm and dry.     Capillary Refill: Capillary refill takes less than 2 seconds.  Neurological:     General: No focal deficit present.     Mental Status: She is alert and oriented to person, place, and time.     Cranial Nerves: No cranial nerve deficit.     Motor: No weakness.  Psychiatric:        Mood and Affect: Mood normal.        Behavior: Behavior normal.        Thought Content: Thought content normal.        Judgment: Judgment normal.     Comments: Sister is present with her today to assist with any questions.          Assessment And Plan:     1. Fall, subsequent encounter Comments: She had a fall and was on the floor for 2 days before going to hospital for admission - AMB Referral to [place]  2. Essential hypertension Comments: Controlled blood pressure, no current medications.  - CMP14+EGFR  3. Mixed hyperlipidemia Comments: Cholesterol levels have been normal will keep her off her cholesterol medication especially since her CK was elevated  4. Depressed bipolar I disorder in partial remission ([place]) Comments: Continue follow up with Behavioral Health  5. Dizziness Comments: Can take meclizine 3 times as needed, this has improved but still has some dizziness - CMP14+EGFR - CBC  6. Dyspnea on exertion Comments: None currently but has used her albuterol inhaler when this occurs that has been helpful - albuterol  (VENTOLIN HFA) 108 (90 Base) MCG/ACT inhaler; Inhale 2 puffs into the lungs every 6 (six) hours as needed for wheezing or shortness of breath. Use with spacer  Dispense: 8 g; Refill: 2 - Spacer/Aero-Holding Chambers (AEROCHAMBER PLUS FLO-VU MEDIUM) MISC; 1 each  by Other route once for 1 dose.  Dispense: 1 each; Refill: 0  7. Elevated CK Comments: Was elevated during her hospitalization thought to be related to laying down on the floor for 2 days.  - CK, total    Patient was given opportunity to ask questions. Patient verbalized understanding of the plan and was able to repeat key elements of the plan. All questions were answered to their satisfaction.  [name], FNP   I, [name], FNP, have reviewed all documentation for this visit. The documentation on 05/31/22 for the exam, diagnosis, procedures, and orders are all accurate and complete.   IF YOU HAVE BEEN REFERRED TO A SPECIALIST, IT MAY TAKE 1-2 WEEKS TO SCHEDULE/PROCESS THE REFERRAL. IF YOU HAVE NOT HEARD FROM US/SPECIALIST IN TWO WEEKS, PLEASE GIVE [place] A CALL AT [phone].   THE PATIENT IS ENCOURAGED TO PRACTICE SOCIAL DISTANCING DUE TO THE COVID-19 PANDEMIC.

## 2022-05-31 NOTE — Patient Instructions (Signed)

## 2022-06-01 DIAGNOSIS — E785 Hyperlipidemia, unspecified: Secondary | ICD-10-CM | POA: Diagnosis not present

## 2022-06-01 DIAGNOSIS — G2401 Drug induced subacute dyskinesia: Secondary | ICD-10-CM | POA: Diagnosis not present

## 2022-06-01 DIAGNOSIS — M47812 Spondylosis without myelopathy or radiculopathy, cervical region: Secondary | ICD-10-CM | POA: Diagnosis not present

## 2022-06-01 DIAGNOSIS — N289 Disorder of kidney and ureter, unspecified: Secondary | ICD-10-CM | POA: Diagnosis not present

## 2022-06-01 DIAGNOSIS — I10 Essential (primary) hypertension: Secondary | ICD-10-CM | POA: Diagnosis not present

## 2022-06-01 DIAGNOSIS — Z9181 History of falling: Secondary | ICD-10-CM | POA: Diagnosis not present

## 2022-06-01 DIAGNOSIS — K5790 Diverticulosis of intestine, part unspecified, without perforation or abscess without bleeding: Secondary | ICD-10-CM | POA: Diagnosis not present

## 2022-06-01 DIAGNOSIS — G8929 Other chronic pain: Secondary | ICD-10-CM | POA: Diagnosis not present

## 2022-06-01 DIAGNOSIS — M1990 Unspecified osteoarthritis, unspecified site: Secondary | ICD-10-CM | POA: Diagnosis not present

## 2022-06-01 DIAGNOSIS — H9192 Unspecified hearing loss, left ear: Secondary | ICD-10-CM | POA: Diagnosis not present

## 2022-06-01 DIAGNOSIS — Z86711 Personal history of pulmonary embolism: Secondary | ICD-10-CM | POA: Diagnosis not present

## 2022-06-01 DIAGNOSIS — G8191 Hemiplegia, unspecified affecting right dominant side: Secondary | ICD-10-CM | POA: Diagnosis not present

## 2022-06-01 DIAGNOSIS — M47817 Spondylosis without myelopathy or radiculopathy, lumbosacral region: Secondary | ICD-10-CM | POA: Diagnosis not present

## 2022-06-01 LAB — CMP14+EGFR
ALT: 15 IU/L (ref 0–32)
AST: 20 IU/L (ref 0–40)
Albumin/Globulin Ratio: 1.5 (ref 1.2–2.2)
Albumin: 4 g/dL (ref 3.8–4.8)
Alkaline Phosphatase: 87 IU/L (ref 44–121)
BUN/Creatinine Ratio: 13 (ref 12–28)
BUN: 11 mg/dL (ref 8–27)
Bilirubin Total: 0.4 mg/dL (ref 0.0–1.2)
CO2: 24 mmol/L (ref 20–29)
Calcium: 10.2 mg/dL (ref 8.7–10.3)
Chloride: 106 mmol/L (ref 96–106)
Creatinine, Ser: 0.84 mg/dL (ref 0.57–1.00)
Globulin, Total: 2.7 g/dL (ref 1.5–4.5)
Glucose: 108 mg/dL — ABNORMAL HIGH (ref 70–99)
Potassium: 4.1 mmol/L (ref 3.5–5.2)
Sodium: 143 mmol/L (ref 134–144)
Total Protein: 6.7 g/dL (ref 6.0–8.5)
eGFR: 73 mL/min/{1.73_m2} (ref >59)

## 2022-06-01 LAB — CBC
Hematocrit: 41.6 % (ref 34.0–46.6)
Hemoglobin: 13.4 g/dL (ref 11.1–15.9)
MCH: 29.6 pg (ref 26.6–33.0)
MCHC: 32.2 g/dL (ref 31.5–35.7)
MCV: 92 fL (ref 79–97)
Platelets: 260 10*3/uL (ref 150–450)
RBC: 4.52 x10E6/uL (ref 3.77–5.28)
RDW: 13.2 % (ref 11.7–15.4)
WBC: 4.9 10*3/uL (ref 3.4–10.8)

## 2022-06-01 LAB — CK: Total CK: 114 U/L (ref 32–182)

## 2022-06-02 ENCOUNTER — Telehealth: Payer: Self-pay | Admitting: *Deleted

## 2022-06-02 NOTE — Chronic Care Management (AMB) (Signed)
  Chronic Care Management   Note  06/02/2022 Name: TEMEKA PORE MRN: [id number] DOB: 1946/09/04  Judy [name] is a 76 y.o. year old female who is a primary care patient of [name], [place]. I reached out to [name] by phone today in response to a referral sent by Ms. Lia Hopping Turri's PCP.  [name] was given information about Chronic Care Management services today including:  CCM service includes personalized support from designated clinical staff supervised by her physician, including individualized plan of care and coordination with other care providers 24/7 contact phone numbers for assistance for urgent and routine care needs. Service will only be billed when office clinical staff spend 20 minutes or more in a month to coordinate care. Only one practitioner may furnish and bill the service in a calendar month. The patient may stop CCM services at any time (effective at the end of the month) by phone call to the office staff. The patient is responsible for co-pay (up to 20% after annual deductible is met) if co-pay is required by the individual health plan.   Patient sister Austin,Harriet agreed to services and verbal consent obtained.   Follow up plan: Telephone appointment with care management team member scheduled for:06/03/22 and 06/22/22 [place]  Direct Dial: [phone]

## 2022-06-02 NOTE — Chronic Care Management (AMB) (Signed)
  Chronic Care Management   Outreach Note  06/02/2022 Name: TANGIE STAY MRN: [id number] DOB: 18-Oct-1945  BRODI NERY is a 76 y.o. year old female who is a primary care patient of [name], [place]. I reached out to [name] by phone today in response to a referral sent by Ms. Lia Hopping Pink's primary care provider.  An unsuccessful telephone outreach was attempted today. The patient was referred to the case management team for assistance with care management and care coordination.   Follow Up Plan: A HIPAA compliant phone message was left for the patient providing contact information and requesting a return call.  The care management team will reach out to the patient again over the next 1 days.   [place]  Direct Dial: [phone]

## 2022-06-03 ENCOUNTER — Ambulatory Visit: Payer: Medicare Other

## 2022-06-03 DIAGNOSIS — M6281 Muscle weakness (generalized): Secondary | ICD-10-CM | POA: Diagnosis not present

## 2022-06-03 DIAGNOSIS — I10 Essential (primary) hypertension: Secondary | ICD-10-CM

## 2022-06-03 DIAGNOSIS — N289 Disorder of kidney and ureter, unspecified: Secondary | ICD-10-CM | POA: Diagnosis not present

## 2022-06-03 DIAGNOSIS — M1990 Unspecified osteoarthritis, unspecified site: Secondary | ICD-10-CM | POA: Diagnosis not present

## 2022-06-03 DIAGNOSIS — K5790 Diverticulosis of intestine, part unspecified, without perforation or abscess without bleeding: Secondary | ICD-10-CM | POA: Diagnosis not present

## 2022-06-03 DIAGNOSIS — M47817 Spondylosis without myelopathy or radiculopathy, lumbosacral region: Secondary | ICD-10-CM | POA: Diagnosis not present

## 2022-06-03 DIAGNOSIS — R262 Difficulty in walking, not elsewhere classified: Secondary | ICD-10-CM | POA: Diagnosis not present

## 2022-06-03 DIAGNOSIS — E782 Mixed hyperlipidemia: Secondary | ICD-10-CM

## 2022-06-03 DIAGNOSIS — M47812 Spondylosis without myelopathy or radiculopathy, cervical region: Secondary | ICD-10-CM | POA: Diagnosis not present

## 2022-06-03 DIAGNOSIS — G8191 Hemiplegia, unspecified affecting right dominant side: Secondary | ICD-10-CM | POA: Diagnosis not present

## 2022-06-03 DIAGNOSIS — G2401 Drug induced subacute dyskinesia: Secondary | ICD-10-CM | POA: Diagnosis not present

## 2022-06-03 DIAGNOSIS — M6259 Muscle wasting and atrophy, not elsewhere classified, multiple sites: Secondary | ICD-10-CM | POA: Diagnosis not present

## 2022-06-03 DIAGNOSIS — Z86711 Personal history of pulmonary embolism: Secondary | ICD-10-CM | POA: Diagnosis not present

## 2022-06-03 DIAGNOSIS — E785 Hyperlipidemia, unspecified: Secondary | ICD-10-CM | POA: Diagnosis not present

## 2022-06-03 DIAGNOSIS — R55 Syncope and collapse: Secondary | ICD-10-CM | POA: Diagnosis not present

## 2022-06-03 DIAGNOSIS — Z9181 History of falling: Secondary | ICD-10-CM | POA: Diagnosis not present

## 2022-06-03 DIAGNOSIS — G8929 Other chronic pain: Secondary | ICD-10-CM | POA: Diagnosis not present

## 2022-06-03 DIAGNOSIS — H9192 Unspecified hearing loss, left ear: Secondary | ICD-10-CM | POA: Diagnosis not present

## 2022-06-03 NOTE — Chronic Care Management (AMB) (Signed)
Chronic Care Management    Social Work Note  06/03/2022 Name: Judy [name] MRN: [id number] DOB: 1946/04/09  Judy [name] is a 76 y.o. year old female who is a primary care patient of [name], [place]. The CCM team was consulted to assist the patient with chronic disease management and/or care coordination needs related to:  HTN, Mixed Hyperlipidemia, Level of Care Concerns .   Engaged with patients sister and caregiver Rica Koyanagi by phonw  for initial visit in response to provider referral for social work chronic care management and care coordination services.   Consent to Services:  The patient was given the following information about Chronic Care Management services today, agreed to services, and gave verbal consent: 1. CCM service includes personalized support from designated clinical staff supervised by the primary care provider, including individualized plan of care and coordination with other care providers 2. 24/7 contact phone numbers for assistance for urgent and routine care needs. 3. Service will only be billed when office clinical staff spend 20 minutes or more in a month to coordinate care. 4. Only one practitioner may furnish and bill the service in a calendar month. 5.The patient may stop CCM services at any time (effective at the end of the month) by phone call to the office staff. 6. The patient will be responsible for cost sharing (co-pay) of up to 20% of the service fee (after annual deductible is met). Patient agreed to services and consent obtained.  Patient agreed to services and consent obtained.   SW spoke with patients sister Judy [name] who is assisting patient at this time until she is placed. Determined Judy [name] is working with [name] at [facility] to obtain long-term care Medicaid for the patient. Provided Judy [name] a list of skilled nursing facilities that accept Medicaid in [facility]. Education provided on placement process. Harriet encouraged to  work closely with DSS while seeking placement.  Assessment: Review of patient past medical history, allergies, medications, and health status, including review of relevant consultants reports was performed today as part of a comprehensive evaluation and provision of chronic care management and care coordination services.     SDOH (Social Determinants of Health) assessments and interventions performed:  SDOH Interventions    Flowsheet Row Most Recent Value  SDOH Interventions   Food Insecurity Interventions Intervention Not Indicated  Housing Interventions Intervention Not Indicated        Advanced Directives Status: Not addressed in this encounter.  CCM Care Plan  No Known Allergies  Outpatient Encounter Medications as of 06/03/2022  Medication Sig   acetaminophen (TYLENOL) 325 MG tablet Take 2 tablets (650 mg total) by mouth every 6 (six) hours as needed for mild pain (or Fever >/= 101).   albuterol (VENTOLIN HFA) 108 (90 Base) MCG/ACT inhaler Inhale 2 puffs into the lungs every 6 (six) hours as needed for wheezing or shortness of breath. Use with spacer   gabapentin (NEURONTIN) 400 MG capsule Take 1 capsule (400 mg total) by mouth 3 (three) times daily.   INGREZZA 80 MG capsule Take 80 mg by mouth daily.   meclizine (ANTIVERT) 12.5 MG tablet Take 1 tablet (12.5 mg total) by mouth 3 (three) times daily as needed for dizziness.   polyethylene glycol (MIRALAX / GLYCOLAX) 17 g packet Take 17 g by mouth daily.   No facility-administered encounter medications on file as of 06/03/2022.    Patient Active Problem List   Diagnosis Date Noted   Vaginal bleeding 04/21/2022  Abnormal EKG 04/21/2022   Right sided weakness 04/20/2022   Chronic pain 04/20/2022   History of pulmonary embolism 04/20/2022   Rhabdomyolysis 04/20/2022   Pulmonary embolism ([place]) 11/15/2019   Spinal stenosis 11/07/2019   Weakness of both lower extremities 11/06/2019   Generalized anxiety disorder 07/24/2018    Solitary pulmonary nodule on lung CT 06/13/2017   Spinal stenosis at L4-L5 level    Dysphagia 05/25/2017   Tardive dyskinesia 05/25/2017   Dyspnea on exertion 05/25/2017   Fall at home, initial encounter 05/25/2017   Bilateral leg pain    Chronic pain of right knee 03/25/2017   High risk medication use 03/25/2017   Family hx of colon cancer 03/25/2017   Occupational exposure in workplace 03/25/2017   Depressed bipolar I disorder in partial remission ([place]) 03/25/2017   Routine general medical examination at a health care facility 06/04/2014   Hyperlipidemia with target LDL less than 130 03/20/2014   Essential hypertension, benign 03/20/2014   Unspecified constipation 03/20/2014   Light headedness 03/20/2014   Acute on chronic kidney disease, stage 3 03/20/2014   Hypokalemia 03/20/2014   Encounter for therapeutic drug monitoring 03/20/2014   Other dysphagia 12/26/2013   GERD (gastroesophageal reflux disease) 12/21/2013   Constipation 12/21/2013   Syncope 12/10/2013   Leukocytosis 12/10/2013   URI (upper respiratory infection) 12/10/2013   Bipolar 1 disorder (HCC)    Obesity (BMI 30-39.9)    Hearing loss in left ear    Vertigo    HYPERCHOLESTEROLEMIA 10/08/2010   Depression 10/08/2010   HYPERTENSION 10/08/2010   ALLERGIC RHINITIS 10/08/2010    Conditions to be addressed/monitored: HTN and Mixed Hyperlipidemia ; Level of care concerns  There are no care plans that you recently modified to display for this patient.    Follow Up Plan: Client will work with DSS caseworker regarding placement needs.      Daneen Schick, BSW, CDP Social Worker, Certified Dementia Practitioner Care Coordination [phone]

## 2022-06-03 NOTE — Patient Instructions (Signed)
Social Worker Visit Information  Goals we discussed today:  -Work with DSS caseworker to obtain placement  Materials provided: Yes: written and verbal education on placement process and options provided to [employer]  Ms. Capurro was given information about Chronic Care Management services today including:  CCM service includes personalized support from designated clinical staff supervised by her physician, including individualized plan of care and coordination with other care providers 24/7 contact phone numbers for assistance for urgent and routine care needs. Service will only be billed when office clinical staff spend 20 minutes or more in a month to coordinate care. Only one practitioner may furnish and bill the service in a calendar month. The patient may stop CCM services at any time (effective at the end of the month) by phone call to the office staff. The patient will be responsible for cost sharing (co-pay) of up to 20% of the service fee (after annual deductible is met).  Patient agreed to services and verbal consent obtained.   The patient verbalized understanding of instructions, educational materials, and care plan provided today and DECLINED offer to receive copy of patient instructions, educational materials, and care plan.   Follow up plan: Client will follow up with [place], [place], CDP Social Worker, Certified Dementia Practitioner Care Coordination [phone]

## 2022-06-07 DIAGNOSIS — M47812 Spondylosis without myelopathy or radiculopathy, cervical region: Secondary | ICD-10-CM | POA: Diagnosis not present

## 2022-06-07 DIAGNOSIS — M1990 Unspecified osteoarthritis, unspecified site: Secondary | ICD-10-CM | POA: Diagnosis not present

## 2022-06-07 DIAGNOSIS — M47817 Spondylosis without myelopathy or radiculopathy, lumbosacral region: Secondary | ICD-10-CM | POA: Diagnosis not present

## 2022-06-07 DIAGNOSIS — I10 Essential (primary) hypertension: Secondary | ICD-10-CM | POA: Diagnosis not present

## 2022-06-07 DIAGNOSIS — N289 Disorder of kidney and ureter, unspecified: Secondary | ICD-10-CM | POA: Diagnosis not present

## 2022-06-07 DIAGNOSIS — G8929 Other chronic pain: Secondary | ICD-10-CM | POA: Diagnosis not present

## 2022-06-07 DIAGNOSIS — Z9181 History of falling: Secondary | ICD-10-CM | POA: Diagnosis not present

## 2022-06-07 DIAGNOSIS — E785 Hyperlipidemia, unspecified: Secondary | ICD-10-CM | POA: Diagnosis not present

## 2022-06-07 DIAGNOSIS — G8191 Hemiplegia, unspecified affecting right dominant side: Secondary | ICD-10-CM | POA: Diagnosis not present

## 2022-06-07 DIAGNOSIS — G2401 Drug induced subacute dyskinesia: Secondary | ICD-10-CM | POA: Diagnosis not present

## 2022-06-07 DIAGNOSIS — K5790 Diverticulosis of intestine, part unspecified, without perforation or abscess without bleeding: Secondary | ICD-10-CM | POA: Diagnosis not present

## 2022-06-07 DIAGNOSIS — H9192 Unspecified hearing loss, left ear: Secondary | ICD-10-CM | POA: Diagnosis not present

## 2022-06-07 DIAGNOSIS — Z86711 Personal history of pulmonary embolism: Secondary | ICD-10-CM | POA: Diagnosis not present

## 2022-06-08 ENCOUNTER — Encounter: Payer: Self-pay | Admitting: Physician Assistant

## 2022-06-08 ENCOUNTER — Ambulatory Visit (INDEPENDENT_AMBULATORY_CARE_PROVIDER_SITE_OTHER): Payer: Medicare Other | Admitting: Physician Assistant

## 2022-06-08 DIAGNOSIS — F3130 Bipolar disorder, current episode depressed, mild or moderate severity, unspecified: Secondary | ICD-10-CM

## 2022-06-08 DIAGNOSIS — F411 Generalized anxiety disorder: Secondary | ICD-10-CM

## 2022-06-08 DIAGNOSIS — G2401 Drug induced subacute dyskinesia: Secondary | ICD-10-CM

## 2022-06-08 DIAGNOSIS — R55 Syncope and collapse: Secondary | ICD-10-CM

## 2022-06-08 MED ORDER — INGREZZA 80 MG PO CAPS
80.0000 mg | ORAL_CAPSULE | Freq: Every day | ORAL | 2 refills | Status: DC
Start: 2022-06-08 — End: 2022-08-26

## 2022-06-08 MED ORDER — GABAPENTIN 400 MG PO CAPS
400.0000 mg | ORAL_CAPSULE | Freq: Every morning | ORAL | 0 refills | Status: DC
Start: 2022-06-08 — End: 2022-06-09

## 2022-06-08 MED ORDER — BUPROPION HCL ER (XL) 150 MG PO TB24: 150.0000 mg | ORAL_TABLET | Freq: Every day | ORAL | 0 refills | Status: DC

## 2022-06-08 NOTE — Progress Notes (Unsigned)
[facility] Check  Patient ID: NIMSI MALES,  MRN: [id number]  PCP: [name], FNP  Date of Evaluation: 06/08/2022 Time spent:40 minutes  Chief Complaint:  Chief Complaint   Anxiety; Depression; Medication Problem    HISTORY/CURRENT STATUS: HPI For hospital f/u. Accompanied by her sister, Renard Hamper.  Here to discuss medications, after hospitalization and stay in SNF for rehab. See detailed hx on chart and brief recap below.   Last seen 07/31/2021, did not keep appt 01/2022. At last visit, was having more anxiety, gabapentin was increased from 400 mg tid to 500 mg tid (she had just gotten supply of 400 mg, so 100 mg added to that until supply ran out) then up to 600 mg tid. Her sister states her home health nurse said that's way too much, 'they've never seen anybody on that high of a dose.' Sister and brother are concerned because she shakes and has slurred speech sometimes and they attribute it to the Gabapentin. Unable to say how long those sx have been present. Since d/c from SNF a few weeks ago, they've been giving her 400 mg q am only.  Pt describes shakes as 'in my chest.' Feels 'jittery.' Comes and goes, no CP, palpitations or SOB. Tremor in hands occas per sister, not affecting ability to eat. 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 "I want to stay on that. It helps." It was held in hosp and SNF, but restarted by pt when returned home. Lives alone w/ frequent checks by family.   Is depressed. Has SI. "I don't want to live like this." No plan. Was on Wellbutrin before hospitalization, (or should have been but d/c summary states not on it.) Tells me she takes it 'sometimes.'  Energy and motivation are low. Sleeps normally for her. ADLs are limited d/t physical health, requires walker for ambulation. Personal hygiene is nl. Appetite is nl. No HI.  Patient denies increased energy with decreased need for sleep, increased talkativeness, racing  thoughts, impulsivity or risky behaviors, increased spending, increased libido, grandiosity, increased irritability or anger, paranoia, and no hallucinations.  Was admitted on 04/20/2022 for syncope/fall, and right sided weakness. Had EEG which was neg, no arrythmias, MRI brain showed no acute abnormalities, no Cspine or Lspine abnormalities. Was seen by cardiology and neurology. Neuro, Dr. Florene Glen thought right sided weakness likely d/t neuropathy after being down, lying on her right side following the fall. That improved over the week of hospitalization, she was d/c to SNF for rehab.   Review of Systems  Constitutional:  Positive for malaise/fatigue.  HENT: Negative.    Eyes: Negative.   Respiratory: Negative.    Cardiovascular: Negative.   Gastrointestinal: Negative.   Genitourinary: Negative.   Musculoskeletal:  Positive for back pain and falls.       See HPI and hosp notes  Skin: Negative.   Neurological:  Positive for weakness.       Non-specific weakness, uses walker  Endo/Heme/Allergies: Negative.   Psychiatric/Behavioral:         See HPI   Individual Medical History/ Review of Systems: Changes? :Yes    see HPI and hosp d/c summary on chart  Past medications for mental health diagnoses include: Latuda, Seroquel, Prozac, Fetzima, Risperdal, gabapentin, Wellbutrin  Allergies: Patient has no known allergies.  Current Medications:  Current Outpatient Medications:    acetaminophen (TYLENOL) 325 MG tablet, Take 2 tablets (650 mg total) by mouth every 6 (six) hours as needed for  mild pain (or Fever >/= 101)., Disp: , Rfl:    albuterol (VENTOLIN HFA) 108 (90 Base) MCG/ACT inhaler, Inhale 2 puffs into the lungs every 6 (six) hours as needed for wheezing or shortness of breath. Use with spacer, Disp: 8 g, Rfl: 2   meclizine (ANTIVERT) 12.5 MG tablet, Take 1 tablet (12.5 mg total) by mouth 3 (three) times daily as needed for dizziness., Disp: 30 tablet, Rfl: 0   polyethylene glycol  (MIRALAX / GLYCOLAX) 17 g packet, Take 17 g by mouth daily., Disp: 30 each, Rfl: 2   buPROPion (WELLBUTRIN XL) 150 MG 24 hr tablet, Take 1 tablet (150 mg total) by mouth daily., Disp: 90 tablet, Rfl: 0   gabapentin (NEURONTIN) 100 MG capsule, 2 po q am for 4 days, then 1 po q am for 4 days, then stop., Disp: 12 capsule, Rfl: 0   INGREZZA 80 MG capsule, Take 1 capsule (80 mg total) by mouth daily., Disp: 30 capsule, Rfl: 2 Medication Side Effects: none  Family Medical/ Social History: Changes? no  MENTAL HEALTH EXAM:  There were no vitals taken for this visit.There is no height or weight on file to calculate BMI.  General Appearance: Casual, Neat and Well Groomed  Eye Contact:  Good  Speech:  Clear and Coherent and Normal Rate  Volume:   speaks softly and is hard of hearing  Mood:  Euthymic  Affect:  Appropriate  Thought Process:  Goal Directed and Descriptions of Associations: Circumstantial  Orientation:  Full (Time, Place, and Person)  Thought Content: Logical   Suicidal Thoughts:  No  Homicidal Thoughts:  No  Memory:  WNL  Judgement:  Fair  Insight:  Fair  Psychomotor Activity:   walks with a walker, slowly, mild fine motor tremor in hands bilat, no evidence of TD  Concentration:  Concentration: Good  Recall:  Tina of Knowledge: Good  Language: Good  Assets:  Desire for Improvement  ADL's:  Impaired  Cognition: WNL  Prognosis:  Fair    DIAGNOSES:    ICD-10-CM   1. Generalized anxiety disorder  F41.1     2. Bipolar I disorder, most recent episode depressed ([place])  F31.30     3. Tardive dyskinesia  G24.01     4. Syncope, unspecified syncope type  R55       Receiving Psychotherapy: No   RECOMMENDATIONS:  PDMP was reviewed.  No results available. I provided 40 minutes of face to face time during this encounter, including time spent before and after the visit in records review, medical decision making, counseling pertinent to today's visit, and charting.    Discussed Gabapentin. Originally on for anxiety and neuropathy. Will stay at lower dose d/t family perceived SE of slurred speech and she may not need the higher dos at this time.  Anxiety may increase, but hard to say. She's been on the current dose for 2-3 weeks. There are other ways to treat anxiety if needed in the future. If slurred speech recurs or the 'shaking feeling' in her chest doesn't improve, or worsens, go to ER. Pt and sister understand. Reminded her to take all meds AS PRESCRIBED, ex don't take Wellbutrin when she 'feels like it.'  Pt has responded well to [place] and doesn't want to stop it. She hasn't been on an AP in awhile, but she reports increased mouth movements when she was off it, so will continue same. Adding an AP isn't necessary at this point.  Contract for safety in  place. Call the office on-call service, 988/hotline, 911, or present to [facility] or ER if any life-threatening psychiatric crisis. Patient verbalizes understanding.   Continue Wellbutrin XL 150 mg, 1 q am. Stressed importance of taking it regularly. Continue gabapentin 400 mg, 1 every morning. Continue Ingrezza 80 mg, 1 p.o. daily. Return in 4 weeks. Stressed importance of compliance with appts.  Donnal Moat, PA-C

## 2022-06-09 ENCOUNTER — Other Ambulatory Visit: Payer: Self-pay | Admitting: Physician Assistant

## 2022-06-09 ENCOUNTER — Telehealth: Payer: Self-pay | Admitting: Physician Assistant

## 2022-06-09 DIAGNOSIS — K5790 Diverticulosis of intestine, part unspecified, without perforation or abscess without bleeding: Secondary | ICD-10-CM | POA: Diagnosis not present

## 2022-06-09 DIAGNOSIS — N289 Disorder of kidney and ureter, unspecified: Secondary | ICD-10-CM | POA: Diagnosis not present

## 2022-06-09 DIAGNOSIS — Z86711 Personal history of pulmonary embolism: Secondary | ICD-10-CM | POA: Diagnosis not present

## 2022-06-09 DIAGNOSIS — H9192 Unspecified hearing loss, left ear: Secondary | ICD-10-CM | POA: Diagnosis not present

## 2022-06-09 DIAGNOSIS — G2401 Drug induced subacute dyskinesia: Secondary | ICD-10-CM | POA: Diagnosis not present

## 2022-06-09 DIAGNOSIS — G8929 Other chronic pain: Secondary | ICD-10-CM | POA: Diagnosis not present

## 2022-06-09 DIAGNOSIS — M47817 Spondylosis without myelopathy or radiculopathy, lumbosacral region: Secondary | ICD-10-CM | POA: Diagnosis not present

## 2022-06-09 DIAGNOSIS — E785 Hyperlipidemia, unspecified: Secondary | ICD-10-CM | POA: Diagnosis not present

## 2022-06-09 DIAGNOSIS — I10 Essential (primary) hypertension: Secondary | ICD-10-CM | POA: Diagnosis not present

## 2022-06-09 DIAGNOSIS — G8191 Hemiplegia, unspecified affecting right dominant side: Secondary | ICD-10-CM | POA: Diagnosis not present

## 2022-06-09 DIAGNOSIS — Z9181 History of falling: Secondary | ICD-10-CM | POA: Diagnosis not present

## 2022-06-09 DIAGNOSIS — M47812 Spondylosis without myelopathy or radiculopathy, cervical region: Secondary | ICD-10-CM | POA: Diagnosis not present

## 2022-06-09 DIAGNOSIS — M1990 Unspecified osteoarthritis, unspecified site: Secondary | ICD-10-CM | POA: Diagnosis not present

## 2022-06-09 MED ORDER — GABAPENTIN 100 MG PO CAPS: ORAL_CAPSULE | ORAL | 0 refills | Status: DC

## 2022-06-09 NOTE — Telephone Encounter (Signed)
Pt was on 600 mg before and dose was reduced to 400 mg yesterday.The note does not state why it was decreased,but she took 400 mg at 3 pm and began to have slurred words.Sister thinks the dose needs to be decreased more.I let them know Judy [name] may not be able to see this message today and will see if Dr Clovis Pu can advise.

## 2022-06-09 NOTE — Telephone Encounter (Signed)
Judy [name], I'm sorry, my note isn't finished. If she's still having slurred speech, go to ER.   The gabapentin was decreased when she was in the hospital in July, to 400 mg tid. Her family has felt that's too much, so they decreased it to 1 pill every morning. They were told yesterday to stay at 1 pill q AM. I'll wean her off it but the anxiety may increase, call if that happens. I'll send the Rx to [facility] on [address].

## 2022-06-09 NOTE — Telephone Encounter (Signed)
Spoke to her brother and the pt and informed

## 2022-06-09 NOTE — Telephone Encounter (Signed)
Pt's sister Reino Bellis called. Apt with Helene Kelp yesterday. Gabapentin 400 mg was given to Pt she was slurring and could not get words out. Need adjustment. Contact Sister @ [phone]

## 2022-06-10 ENCOUNTER — Other Ambulatory Visit (INDEPENDENT_AMBULATORY_CARE_PROVIDER_SITE_OTHER): Payer: Medicare Other | Admitting: Nurse Practitioner

## 2022-06-10 DIAGNOSIS — G8929 Other chronic pain: Secondary | ICD-10-CM | POA: Diagnosis not present

## 2022-06-10 DIAGNOSIS — M47817 Spondylosis without myelopathy or radiculopathy, lumbosacral region: Secondary | ICD-10-CM | POA: Diagnosis not present

## 2022-06-10 DIAGNOSIS — M1990 Unspecified osteoarthritis, unspecified site: Secondary | ICD-10-CM

## 2022-06-10 DIAGNOSIS — Z9181 History of falling: Secondary | ICD-10-CM | POA: Diagnosis not present

## 2022-06-10 DIAGNOSIS — I10 Essential (primary) hypertension: Secondary | ICD-10-CM | POA: Diagnosis not present

## 2022-06-10 DIAGNOSIS — G2401 Drug induced subacute dyskinesia: Secondary | ICD-10-CM

## 2022-06-10 DIAGNOSIS — M47812 Spondylosis without myelopathy or radiculopathy, cervical region: Secondary | ICD-10-CM

## 2022-06-10 DIAGNOSIS — G8191 Hemiplegia, unspecified affecting right dominant side: Secondary | ICD-10-CM | POA: Diagnosis not present

## 2022-06-10 DIAGNOSIS — N289 Disorder of kidney and ureter, unspecified: Secondary | ICD-10-CM | POA: Diagnosis not present

## 2022-06-10 DIAGNOSIS — K5790 Diverticulosis of intestine, part unspecified, without perforation or abscess without bleeding: Secondary | ICD-10-CM | POA: Diagnosis not present

## 2022-06-10 DIAGNOSIS — Z86711 Personal history of pulmonary embolism: Secondary | ICD-10-CM | POA: Diagnosis not present

## 2022-06-10 DIAGNOSIS — E782 Mixed hyperlipidemia: Secondary | ICD-10-CM

## 2022-06-10 DIAGNOSIS — E785 Hyperlipidemia, unspecified: Secondary | ICD-10-CM | POA: Diagnosis not present

## 2022-06-10 DIAGNOSIS — H9192 Unspecified hearing loss, left ear: Secondary | ICD-10-CM | POA: Diagnosis not present

## 2022-06-10 NOTE — Telephone Encounter (Signed)
Encourage her to take the Wellbutrin daily, which will help depression. I don't want to make other changes, it's best to only do one thing at a time. Have her use breathing techniques and redirecting of thoughts to help if she gets panicky.

## 2022-06-10 NOTE — Telephone Encounter (Signed)
Pt informed

## 2022-06-10 NOTE — Progress Notes (Signed)
   Chief Complaint  Patient presents with   [place] home health orders orders from [facility]. Start of care 05/29/2022.   Certification and orders from 05/29/2022 through 07/27/2022 are reviewed, signed and faxed back to home health company.  Need of intermittent skilled services at home: PT, OT MSW, and HHA  The home health care plan has been established by me and will be reviewed and updated as needed to maximize patient recovery.  I certify that all home health services have been and will be furnished to the patient while under my care.  Face-to-face encounter in which the need for home health services was established: 04/28/2022  Patient is receiving home health services for the following diagnoses: Problem List Items Addressed This Visit       Cardiovascular and Mediastinum   Essential hypertension, benign     Other   Chronic pain - Primary   Other Visit Diagnoses     Right hemiparesis ([place])       Cervical spondylosis without myelopathy       Lumbosacral spondylosis without myelopathy       Osteoarthritis, chronic       Mixed hyperlipidemia       Drug-induced dyskinesia            [name], FNP

## 2022-06-11 DIAGNOSIS — M47817 Spondylosis without myelopathy or radiculopathy, lumbosacral region: Secondary | ICD-10-CM | POA: Diagnosis not present

## 2022-06-11 DIAGNOSIS — Z86711 Personal history of pulmonary embolism: Secondary | ICD-10-CM | POA: Diagnosis not present

## 2022-06-11 DIAGNOSIS — M1990 Unspecified osteoarthritis, unspecified site: Secondary | ICD-10-CM | POA: Diagnosis not present

## 2022-06-11 DIAGNOSIS — G2401 Drug induced subacute dyskinesia: Secondary | ICD-10-CM | POA: Diagnosis not present

## 2022-06-11 DIAGNOSIS — K5790 Diverticulosis of intestine, part unspecified, without perforation or abscess without bleeding: Secondary | ICD-10-CM | POA: Diagnosis not present

## 2022-06-11 DIAGNOSIS — Z9181 History of falling: Secondary | ICD-10-CM | POA: Diagnosis not present

## 2022-06-11 DIAGNOSIS — I10 Essential (primary) hypertension: Secondary | ICD-10-CM | POA: Diagnosis not present

## 2022-06-11 DIAGNOSIS — G8929 Other chronic pain: Secondary | ICD-10-CM | POA: Diagnosis not present

## 2022-06-11 DIAGNOSIS — H9192 Unspecified hearing loss, left ear: Secondary | ICD-10-CM | POA: Diagnosis not present

## 2022-06-11 DIAGNOSIS — M47812 Spondylosis without myelopathy or radiculopathy, cervical region: Secondary | ICD-10-CM | POA: Diagnosis not present

## 2022-06-11 DIAGNOSIS — G8191 Hemiplegia, unspecified affecting right dominant side: Secondary | ICD-10-CM | POA: Diagnosis not present

## 2022-06-11 DIAGNOSIS — N289 Disorder of kidney and ureter, unspecified: Secondary | ICD-10-CM | POA: Diagnosis not present

## 2022-06-11 DIAGNOSIS — E785 Hyperlipidemia, unspecified: Secondary | ICD-10-CM | POA: Diagnosis not present

## 2022-06-14 DIAGNOSIS — G8929 Other chronic pain: Secondary | ICD-10-CM | POA: Diagnosis not present

## 2022-06-14 DIAGNOSIS — M47812 Spondylosis without myelopathy or radiculopathy, cervical region: Secondary | ICD-10-CM | POA: Diagnosis not present

## 2022-06-14 DIAGNOSIS — M1990 Unspecified osteoarthritis, unspecified site: Secondary | ICD-10-CM | POA: Diagnosis not present

## 2022-06-14 DIAGNOSIS — N289 Disorder of kidney and ureter, unspecified: Secondary | ICD-10-CM | POA: Diagnosis not present

## 2022-06-14 DIAGNOSIS — G8191 Hemiplegia, unspecified affecting right dominant side: Secondary | ICD-10-CM | POA: Diagnosis not present

## 2022-06-14 DIAGNOSIS — H9192 Unspecified hearing loss, left ear: Secondary | ICD-10-CM | POA: Diagnosis not present

## 2022-06-14 DIAGNOSIS — E785 Hyperlipidemia, unspecified: Secondary | ICD-10-CM | POA: Diagnosis not present

## 2022-06-14 DIAGNOSIS — G2401 Drug induced subacute dyskinesia: Secondary | ICD-10-CM | POA: Diagnosis not present

## 2022-06-14 DIAGNOSIS — I10 Essential (primary) hypertension: Secondary | ICD-10-CM | POA: Diagnosis not present

## 2022-06-14 DIAGNOSIS — Z86711 Personal history of pulmonary embolism: Secondary | ICD-10-CM | POA: Diagnosis not present

## 2022-06-14 DIAGNOSIS — K5790 Diverticulosis of intestine, part unspecified, without perforation or abscess without bleeding: Secondary | ICD-10-CM | POA: Diagnosis not present

## 2022-06-14 DIAGNOSIS — Z9181 History of falling: Secondary | ICD-10-CM | POA: Diagnosis not present

## 2022-06-14 DIAGNOSIS — M47817 Spondylosis without myelopathy or radiculopathy, lumbosacral region: Secondary | ICD-10-CM | POA: Diagnosis not present

## 2022-06-15 ENCOUNTER — Telehealth: Payer: Self-pay

## 2022-06-15 DIAGNOSIS — N289 Disorder of kidney and ureter, unspecified: Secondary | ICD-10-CM | POA: Diagnosis not present

## 2022-06-15 DIAGNOSIS — Z9181 History of falling: Secondary | ICD-10-CM | POA: Diagnosis not present

## 2022-06-15 DIAGNOSIS — G8929 Other chronic pain: Secondary | ICD-10-CM | POA: Diagnosis not present

## 2022-06-15 DIAGNOSIS — G8191 Hemiplegia, unspecified affecting right dominant side: Secondary | ICD-10-CM | POA: Diagnosis not present

## 2022-06-15 DIAGNOSIS — Z86711 Personal history of pulmonary embolism: Secondary | ICD-10-CM | POA: Diagnosis not present

## 2022-06-15 DIAGNOSIS — M1990 Unspecified osteoarthritis, unspecified site: Secondary | ICD-10-CM | POA: Diagnosis not present

## 2022-06-15 DIAGNOSIS — M47812 Spondylosis without myelopathy or radiculopathy, cervical region: Secondary | ICD-10-CM | POA: Diagnosis not present

## 2022-06-15 DIAGNOSIS — E785 Hyperlipidemia, unspecified: Secondary | ICD-10-CM | POA: Diagnosis not present

## 2022-06-15 DIAGNOSIS — M47817 Spondylosis without myelopathy or radiculopathy, lumbosacral region: Secondary | ICD-10-CM | POA: Diagnosis not present

## 2022-06-15 DIAGNOSIS — G2401 Drug induced subacute dyskinesia: Secondary | ICD-10-CM | POA: Diagnosis not present

## 2022-06-15 DIAGNOSIS — K5790 Diverticulosis of intestine, part unspecified, without perforation or abscess without bleeding: Secondary | ICD-10-CM | POA: Diagnosis not present

## 2022-06-15 DIAGNOSIS — H9192 Unspecified hearing loss, left ear: Secondary | ICD-10-CM | POA: Diagnosis not present

## 2022-06-15 DIAGNOSIS — I10 Essential (primary) hypertension: Secondary | ICD-10-CM | POA: Diagnosis not present

## 2022-06-15 NOTE — Chronic Care Management (AMB) (Signed)
Chronic Care Management Pharmacy Assistant   Name: ELLIETT GUARISCO  MRN: [id number] DOB: 1945/11/12  Reason for Encounter: Chart review for CPP visit on 06-22-2022   Conditions to be addressed/monitored: HTN, CKD Stage 3, GERD, and Allergic Rhinitis   Recent office visits:  06-10-2022 [name], [place]. Home care visit.  06-03-2022 Daneen Schick (CCM).  05-31-2022 [name], [name]. Glucose= 108.  04-14-2022 Little, Claudette Stapler, RN (CCM).  03-02-2022 [name], [place]. Glucose= 124, Creatinine= 1.01, eGFR= 58, Sodium= 145, Chloride= 108. CT CHEST NODULE FOLLOW UP WO CONTRAST and DG chest view ordered. START ventolin 2 puffs every 6 hours PRN. Shingrix given.  Recent consult visits:  06-08-2022 Alen Blew (Psychiatry). CHANGE gabapentin 400 mg 3 times daily TO 400 mg daily.  Hospital visits:  Medication Reconciliation was completed by comparing discharge summary, patient's EMR and Pharmacy list, and upon discussion with patient.  Admitted to the hospital on 04-20-2022 due to Syncope. Discharge date was 04-28-2022. Discharged from [place]?Medications Started at [facility] Discharge:?? TYLENOL 325 mg 2 tablets every 6 hours PRN  Medication Changes at Hospital Discharge: None  Medications Discontinued at Hospital Discharge: Atorvastatin Wellbutrin Ingrezza  Medications that remain the same after Hospital Discharge:??  -All other medications will remain the same.    Medications: Outpatient Encounter Medications as of 06/15/2022  Medication Sig   acetaminophen (TYLENOL) 325 MG tablet Take 2 tablets (650 mg total) by mouth every 6 (six) hours as needed for mild pain (or Fever >/= 101).   albuterol (VENTOLIN HFA) 108 (90 Base) MCG/ACT inhaler Inhale 2 puffs into the lungs every 6 (six) hours as needed for wheezing or shortness of breath. Use with spacer   buPROPion (WELLBUTRIN XL) 150 MG 24 hr tablet Take 1 tablet (150 mg total) by mouth daily.    gabapentin (NEURONTIN) 100 MG capsule 2 po q am for 4 days, then 1 po q am for 4 days, then stop.   INGREZZA 80 MG capsule Take 1 capsule (80 mg total) by mouth daily.   meclizine (ANTIVERT) 12.5 MG tablet Take 1 tablet (12.5 mg total) by mouth 3 (three) times daily as needed for dizziness.   polyethylene glycol (MIRALAX / GLYCOLAX) 17 g packet Take 17 g by mouth daily.   No facility-administered encounter medications on file as of 06/15/2022.   Lab Results  Component Value Date/Time   HGBA1C 5.5 03/02/2022 03:39 PM   HGBA1C 5.3 10/29/2021 03:31 PM     BP Readings from Last 3 Encounters:  05/31/22 128/70  04/28/22 133/77  03/02/22 124/78      Have you seen any other providers since your last visit with PCP? No  What is your top health concern to discuss at your upcoming visit? Patient stated she would like to  Do you have any problems getting your medications from the pharmacy? No  Financial barriers? No Access barriers? No   ARCHIE ATILANO was reminded to have all medications, supplements and any blood glucose and/or blood pressure readings available for review with Orlando Penner, Pharm. D, at her telephone visit on 06-22-2022 at 9:00.    Care Gaps: Last annual wellness visit? 12-03-2021 Last Colonoscopy? Declined Last Mammogram? Declined Last Bone Denisty? 04-06-2013 Last eye exam / retinopathy screening? Patient stated she needs to find a eye doctor Last diabetic foot exam? None  Immunizations: Covid booster overdue 2nd shingrix overdue Flu vaccine overdue  Star Rating Drugs: None  Malecca [facility] CMA  Clinical Pharmacist Assistant [phone]

## 2022-06-18 DIAGNOSIS — N289 Disorder of kidney and ureter, unspecified: Secondary | ICD-10-CM | POA: Diagnosis not present

## 2022-06-18 DIAGNOSIS — G2401 Drug induced subacute dyskinesia: Secondary | ICD-10-CM | POA: Diagnosis not present

## 2022-06-18 DIAGNOSIS — K5790 Diverticulosis of intestine, part unspecified, without perforation or abscess without bleeding: Secondary | ICD-10-CM | POA: Diagnosis not present

## 2022-06-18 DIAGNOSIS — G8929 Other chronic pain: Secondary | ICD-10-CM | POA: Diagnosis not present

## 2022-06-18 DIAGNOSIS — M1990 Unspecified osteoarthritis, unspecified site: Secondary | ICD-10-CM | POA: Diagnosis not present

## 2022-06-18 DIAGNOSIS — M47812 Spondylosis without myelopathy or radiculopathy, cervical region: Secondary | ICD-10-CM | POA: Diagnosis not present

## 2022-06-18 DIAGNOSIS — I10 Essential (primary) hypertension: Secondary | ICD-10-CM | POA: Diagnosis not present

## 2022-06-18 DIAGNOSIS — G8191 Hemiplegia, unspecified affecting right dominant side: Secondary | ICD-10-CM | POA: Diagnosis not present

## 2022-06-18 DIAGNOSIS — H9192 Unspecified hearing loss, left ear: Secondary | ICD-10-CM | POA: Diagnosis not present

## 2022-06-18 DIAGNOSIS — Z9181 History of falling: Secondary | ICD-10-CM | POA: Diagnosis not present

## 2022-06-18 DIAGNOSIS — E785 Hyperlipidemia, unspecified: Secondary | ICD-10-CM | POA: Diagnosis not present

## 2022-06-18 DIAGNOSIS — Z86711 Personal history of pulmonary embolism: Secondary | ICD-10-CM | POA: Diagnosis not present

## 2022-06-18 DIAGNOSIS — M47817 Spondylosis without myelopathy or radiculopathy, lumbosacral region: Secondary | ICD-10-CM | POA: Diagnosis not present

## 2022-06-21 DIAGNOSIS — N289 Disorder of kidney and ureter, unspecified: Secondary | ICD-10-CM | POA: Diagnosis not present

## 2022-06-21 DIAGNOSIS — G8191 Hemiplegia, unspecified affecting right dominant side: Secondary | ICD-10-CM | POA: Diagnosis not present

## 2022-06-21 DIAGNOSIS — M47812 Spondylosis without myelopathy or radiculopathy, cervical region: Secondary | ICD-10-CM | POA: Diagnosis not present

## 2022-06-21 DIAGNOSIS — H9192 Unspecified hearing loss, left ear: Secondary | ICD-10-CM | POA: Diagnosis not present

## 2022-06-21 DIAGNOSIS — M1990 Unspecified osteoarthritis, unspecified site: Secondary | ICD-10-CM | POA: Diagnosis not present

## 2022-06-21 DIAGNOSIS — I10 Essential (primary) hypertension: Secondary | ICD-10-CM | POA: Diagnosis not present

## 2022-06-21 DIAGNOSIS — M47817 Spondylosis without myelopathy or radiculopathy, lumbosacral region: Secondary | ICD-10-CM | POA: Diagnosis not present

## 2022-06-21 DIAGNOSIS — G2401 Drug induced subacute dyskinesia: Secondary | ICD-10-CM | POA: Diagnosis not present

## 2022-06-21 DIAGNOSIS — G8929 Other chronic pain: Secondary | ICD-10-CM | POA: Diagnosis not present

## 2022-06-21 DIAGNOSIS — Z9181 History of falling: Secondary | ICD-10-CM | POA: Diagnosis not present

## 2022-06-21 DIAGNOSIS — E785 Hyperlipidemia, unspecified: Secondary | ICD-10-CM | POA: Diagnosis not present

## 2022-06-21 DIAGNOSIS — Z86711 Personal history of pulmonary embolism: Secondary | ICD-10-CM | POA: Diagnosis not present

## 2022-06-21 DIAGNOSIS — K5790 Diverticulosis of intestine, part unspecified, without perforation or abscess without bleeding: Secondary | ICD-10-CM | POA: Diagnosis not present

## 2022-06-22 ENCOUNTER — Ambulatory Visit (INDEPENDENT_AMBULATORY_CARE_PROVIDER_SITE_OTHER): Payer: Medicare Other

## 2022-06-22 ENCOUNTER — Telehealth: Payer: Self-pay

## 2022-06-22 DIAGNOSIS — E782 Mixed hyperlipidemia: Secondary | ICD-10-CM

## 2022-06-22 NOTE — Telephone Encounter (Signed)
    Care Management   Follow Up Note   06/22/2022 Name: Judy [name] MRN: [id number] DOB: November 29, 1945   Referred by: [name], FNP Reason for referral : No chief complaint on file.   An unsuccessful telephone outreach was attempted today. The patient was referred to the case management team for assistance with care management and care coordination.  Spoke with [name] patients sister, who prefers that I talk with Mr. Judy [name] her brother who lives with Judy [name]. He is responsible for all of her medication and daily routines.  [name] gave me the following phone numbers: [phone] and [phone]. I spoke with Mr. Judy [name] who is available to talk at 3 pm today. I will reschedule the appointment time to 3 pm.   Follow Up Plan: The patient has been provided with contact information for the care management team and has been advised to call with any health related questions or concerns.   Orlando Penner, CPP, PharmD Clinical Pharmacist Practitioner [facility] [phone]

## 2022-06-22 NOTE — Progress Notes (Signed)
Chronic Care Management Pharmacy Note  06/29/2022 Name:  Judy [name] MRN:  [id number] DOB:  1946/05/22  Summary:  Patients brother reports that he has just begun taking care of Judy [name] and her needs.   Recommendations/Changes made from today's visit: Recommend patients Meclizine be changed to one tablet daily, due to her only taking it once per day.  Recommend patient continue Ingrezza  Plan: [facility] psychiatry team to complete prior authorization    Subjective: Judy [name] is an 76 y.o. year old female who is a primary patient of [name], [place].  The CCM team was consulted for assistance with disease management and care coordination needs.    Engaged with patient by telephone for initial visit in response to provider referral for pharmacy case management and/or care coordination services. Judy [name] lives alone, and her brother and sister in law come by periodically. Patient is with her brother and brother in law. Per Mr and Mrs. [name] the movement has gotten better with Ingrezza. Mr. Idolina Primer will be bringing Judy [name] to the appointment.   Consent to Services:  The patient was given the following information about Chronic Care Management services today, agreed to services, and gave verbal consent: 1. CCM service includes personalized support from designated clinical staff supervised by the primary care provider, including individualized plan of care and coordination with other care providers 2. 24/7 contact phone numbers for assistance for urgent and routine care needs. 3. Service will only be billed when office clinical staff spend 20 minutes or more in a month to coordinate care. 4. Only one practitioner may furnish and bill the service in a calendar month. 5.The patient may stop CCM services at any time (effective at the end of the month) by phone call to the office staff. 6. The patient will be responsible for cost sharing (co-pay) of up to 20% of the service fee  (after annual deductible is met). Patient agreed to services and consent obtained.  Patient Care Team: [name], FNP as PCP - General ([place]) Kristeen Miss, MD as Consulting Physician (Neurosurgery) Cottle, Billey Co., MD as Attending Physician (Psychiatry) Mayford Knife, [facility] (Pharmacist) Conditions to be addressed/monitored: HTN, CKD Stage 3, GERD, and Allergic Rhinitis     Recent office visits:  06-10-2022 [name], [place]. Home care visit.   06-03-2022 Daneen Schick (CCM).   05-31-2022 [name], [place]. Glucose= 108.   04-14-2022 Little, Claudette Stapler, RN (CCM).   03-02-2022 [name], [place]. Glucose= 124, Creatinine= 1.01, eGFR= 58, Sodium= 145, Chloride= 108. CT CHEST NODULE FOLLOW UP WO CONTRAST and DG chest view ordered. START ventolin 2 puffs every 6 hours PRN. Shingrix given.   Recent consult visits:  06-08-2022 Alen Blew (Psychiatry). CHANGE gabapentin 400 mg 3 times daily TO 400 mg daily.   Hospital visits:  Medication Reconciliation was completed by comparing discharge summary, patient's EMR and Pharmacy list, and upon discussion with patient.   Admitted to the hospital on 04-20-2022 due to Syncope. Discharge date was 04-28-2022. Discharged from [place]?Medications Started at [facility] Discharge:?? TYLENOL 325 mg 2 tablets every 6 hours PRN   Medication Changes at Hospital Discharge: None   Medications Discontinued at Hospital Discharge: Atorvastatin Wellbutrin Ingrezza   Medications that remain the same after Hospital Discharge:??  -All other medications will remain the same.     Objective:  Lab Results  Component Value Date   CREATININE 0.84 05/31/2022  BUN 11 05/31/2022   EGFR 73 05/31/2022   GFRNONAA >60 04/28/2022   GFRAA >60 11/20/2019   NA 143 05/31/2022   K 4.1 05/31/2022   CALCIUM 10.2 05/31/2022   CO2 24 05/31/2022   GLUCOSE 108 (H) 05/31/2022    Lab Results  Component Value  Date/Time   HGBA1C 5.5 03/02/2022 03:39 PM   HGBA1C 5.3 10/29/2021 03:31 PM    Last diabetic Eye exam: No results found for: "HMDIABEYEEXA"  Last diabetic Foot exam: No results found for: "HMDIABFOOTEX"   Lab Results  Component Value Date   CHOL 156 03/02/2022   HDL 70 03/02/2022   LDLCALC 71 03/02/2022   TRIG 77 03/02/2022   CHOLHDL 2.2 03/02/2022       Latest Ref Rng & Units 05/31/2022   10:33 AM 04/28/2022    4:37 AM 04/25/2022   11:47 AM  Hepatic Function  Total Protein 6.0 - 8.5 g/dL 6.7  5.4  5.4   Albumin 3.8 - 4.8 g/dL 4.0  2.7  2.8   AST 0 - 40 IU/L 20  35  55   ALT 0 - 32 IU/L 15  29  36   Alk Phosphatase 44 - 121 IU/L 87  44  44   Total Bilirubin 0.0 - 1.2 mg/dL 0.4  0.4  0.6     Lab Results  Component Value Date/Time   TSH 1.630 07/10/2018 12:20 PM   TSH 0.93 03/21/2018 03:15 PM       Latest Ref Rng & Units 05/31/2022   10:33 AM 04/28/2022    4:37 AM 04/27/2022    9:30 AM  CBC  WBC 3.4 - 10.8 x10E3/uL 4.9  5.1  5.5   Hemoglobin 11.1 - 15.9 g/dL 13.4  12.2  13.1   Hematocrit 34.0 - 46.6 % 41.6  36.7  38.7   Platelets 150 - 450 x10E3/uL 260  218  211     No results found for: "VD25OH"  Clinical ASCVD: No  The 10-year ASCVD risk score (Arnett DK, et al., 2019) is: 14.1%   Values used to calculate the score:     Age: 82 years     Sex: Female     Is Non-Hispanic African American: Yes     Diabetic: No     Tobacco smoker: No     Systolic Blood Pressure: 623 mmHg     Is BP treated: No     HDL Cholesterol: 70 mg/dL     Total Cholesterol: 156 mg/dL       06/22/2022    3:58 PM 05/31/2022    9:03 AM 03/02/2022    3:19 PM  Depression screen PHQ 2/9  Decreased Interest 2 3 1   Down, Depressed, Hopeless 1 3 1   PHQ - 2 Score 3 6 2   Altered sleeping 3 3 1   Tired, decreased energy 3 2 3   Change in appetite 3 3 0  Feeling bad or failure about yourself  3 3 0  Trouble concentrating 1 3 0  Moving slowly or fidgety/restless 1 0 0  Suicidal thoughts 0 3 0   PHQ-9 Score 17 23 6   Difficult doing work/chores Extremely dIfficult  Not difficult at all      Social History   Tobacco Use  Smoking Status Never  Smokeless Tobacco Never   BP Readings from Last 3 Encounters:  06/28/22 132/68  05/31/22 128/70  04/28/22 133/77   Pulse Readings from Last 3 Encounters:  06/28/22 95  05/31/22 64  04/28/22 60   Wt Readings from Last 3 Encounters:  06/28/22 155 lb 4.8 oz (70.4 kg)  05/31/22 154 lb (69.9 kg)  04/26/22 176 lb 12.9 oz (80.2 kg)   BMI Readings from Last 3 Encounters:  06/28/22 22.93 kg/m  05/31/22 22.74 kg/m  04/26/22 26.11 kg/m    Assessment/Interventions: Review of patient past medical history, allergies, medications, health status, including review of consultants reports, laboratory and other test data, was performed as part of comprehensive evaluation and provision of chronic care management services.   SDOH:  (Social Determinants of Health) assessments and interventions performed: Yes SDOH Interventions    Flowsheet Row Chronic Care Management from 06/22/2022 in [place] Management from 06/03/2022 in [place] Visit from 03/02/2022 in [place] from 09/18/2019 in [place] Visit from 07/02/2019 in [place] Visit from 07/24/2018 in [place] Interventions -- Intervention Not Indicated -- -- -- --  Housing Interventions -- Intervention Not Indicated -- -- -- --  Transportation Interventions Other (Comment)  [REFERRAL TO BE PLACED TO RN AND SOCIAL WORKER] -- -- -- -- --  Depression Interventions/Treatment  -- -- Referral to Psychiatry, Currently on Treatment PHQ2-9 Score <4 Follow-up Not Indicated Currently on Treatment  [she is going to [facility]] Medication, Currently on  Treatment      [place]: No Food Insecurity (06/03/2022)  Housing: Low Risk  (06/03/2022)  Transportation Needs: Unmet Transportation Needs (06/22/2022)  Alcohol Screen: Low Risk  (05/24/2018)  Depression (PHQ2-9): High Risk (06/22/2022)  Financial Resource Strain: High Risk (06/22/2022)  Physical Activity: Insufficiently Active (12/03/2021)  Stress: No Stress Concern Present (12/03/2021)  Tobacco Use: Low Risk  (06/28/2022)    [place]  No Known Allergies  Medications Reviewed Today     Reviewed by Glendale Chard, MD (Physician) on 06/28/22 at [phone]  Med List Status: <None>   Medication Order Taking? Sig Documenting Provider Last Dose Status Informant  acetaminophen (TYLENOL) 325 MG tablet [id number] Yes Take 2 tablets (650 mg total) by mouth every 6 (six) hours as needed for mild pain (or Fever >/= 101). Ghimire, Henreitta Leber, MD Taking Active   albuterol (VENTOLIN HFA) 108 (90 Base) MCG/ACT inhaler [id number] Yes Inhale 2 puffs into the lungs every 6 (six) hours as needed for wheezing or shortness of breath. Use with spacer [name], FNP Taking Active   atorvastatin (LIPITOR) 10 MG tablet [id number] Yes Take 10 mg by mouth daily. [provider] Taking Active   buPROPion (WELLBUTRIN XL) 150 MG 24 hr tablet [id number] Yes Take 1 tablet (150 mg total) by mouth daily. Addison Lank, PA-C Taking Active   gabapentin (NEURONTIN) 100 MG capsule [id number]  2 po q am for 4 days, then 1 po q am for 4 days, then stop. Alen Blew  Active   INGREZZA 80 MG capsule [id number] Yes Take 1 capsule (80 mg total) by mouth daily. Addison Lank, PA-C Taking Active   meclizine (ANTIVERT) 12.5 MG tablet [id number] Yes Take 1 tablet (12.5 mg total) by mouth 3 (three) times daily as needed for dizziness. [name], FNP Taking Active   polyethylene glycol (MIRALAX / GLYCOLAX) 17 g packet [id number]  Take 17 g by mouth daily. [name], FNP  Active  Patient Active Problem List   Diagnosis Date Noted   Vaginal bleeding 04/21/2022   Abnormal EKG 04/21/2022   Right sided weakness 04/20/2022   Chronic pain 04/20/2022   History of pulmonary embolism 04/20/2022   Rhabdomyolysis 04/20/2022   Pulmonary embolism (HCC) 11/15/2019   Spinal stenosis 11/07/2019   Weakness of both lower extremities 11/06/2019   Generalized anxiety disorder 07/24/2018   Solitary pulmonary nodule on lung CT 06/13/2017   Spinal stenosis at L4-L5 level    Dysphagia 05/25/2017   Tardive dyskinesia 05/25/2017   Dyspnea on exertion 05/25/2017   Fall at home, initial encounter 05/25/2017   Bilateral leg pain    Chronic pain of right knee 03/25/2017   High risk medication use 03/25/2017   Family hx of colon cancer 03/25/2017   Occupational exposure in workplace 03/25/2017   Depressed bipolar I disorder in partial remission (HCC) 03/25/2017   Routine general medical examination at a health care facility 06/04/2014   Hyperlipidemia with target LDL less than 130 03/20/2014   Essential hypertension, benign 03/20/2014   Unspecified constipation 03/20/2014   Light headedness 03/20/2014   Acute on chronic kidney disease, stage 3 03/20/2014   Hypokalemia 03/20/2014   Encounter for therapeutic drug monitoring 03/20/2014   Other dysphagia 12/26/2013   GERD (gastroesophageal reflux disease) 12/21/2013   Constipation 12/21/2013   Syncope 12/10/2013   Leukocytosis 12/10/2013   URI (upper respiratory infection) 12/10/2013   Bipolar 1 disorder (HCC)    Obesity (BMI 30-39.9)    Hearing loss in left ear    Vertigo    HYPERCHOLESTEROLEMIA 10/08/2010   Depression 10/08/2010   HYPERTENSION 10/08/2010   ALLERGIC RHINITIS 10/08/2010    Immunization History  Administered Date(s) Administered   Fluad Quad(high Dose 65+) 06/28/2022   Influenza, High Dose Seasonal PF 07/24/2018, 07/02/2019   Influenza,inj,Quad PF,6+ Mos 12/11/2013, 09/12/2015, 07/20/2016, 08/10/2017    Influenza-Unspecified 08/04/2021   PFIZER(Purple Top)SARS-COV-2 Vaccination 12/24/2019, 01/15/2020, 07/14/2020, 10/09/2020   Pneumococcal Polysaccharide-23 12/11/2013, 11/08/2019   Zoster Recombinat (Shingrix) 03/02/2022    Conditions to be addressed/monitored:  Hyperlipidemia, Depression, and Vertigo  Care Plan : CCM Pharmacy Care Plan  Updates made by Harlan Stains, RPH since 06/29/2022 12:00 AM     Problem: Depression, Vertigo, HLD      Long-Range Goal: Disease Management   Note:   Current Barriers:  Unable to independently monitor therapeutic efficacy  Pharmacist Clinical Goal(s):  Patient will achieve adherence to monitoring guidelines and medication adherence to achieve therapeutic efficacy through collaboration with PharmD and provider.   Interventions: 1:1 collaboration with Arnette Felts, FNP regarding development and update of comprehensive plan of care as evidenced by provider attestation and co-signature Inter-disciplinary care team collaboration (see longitudinal plan of care) Comprehensive medication review performed; medication list updated in electronic medical record  Vertigo (Goal: reduce dizziness) -Controlled -Current treatment  Meclizine 12.5 mg tablet - take 1 tablet three times per daily Appropriate, Effective, Safe, Accessible  Patient reports taking one tablet daily  -Medications previously tried: uncertain  -Recommended to continue current medication Collaborate with PCP team to change directions to once per day to avoid patient confusion   Hyperlipidemia: (LDL goal < 100) -Controlled -Current treatment: Atorvastatin 10 mg tablet once per day Appropriate, Query effective  -Current dietary patterns: will discuss during next visit  -Educated on Cholesterol goals;  Benefits of statin for ASCVD risk reduction; Importance of limiting foods high in cholesterol; Exercise goal of 150 minutes per week; -Recommended to continue current  medication  Depression (Goal: remission of symptoms as indicated by a PHQ-9 Score of < 5 points) -Uncontrolled -Current treatment: Buproprion XL 150 mg tablet once per day. Appropriate, Query effective Patient reports being closely monitored by her psychiatry team and will follow up with the provider.  -PHQ9: 90 -Connected with psychiatrist at [facility] for mental health support -Patient reports that she sees the psychiatrist every 6 months, she was last seen about two weeks ago.  -At that time her Gabapentin was reduced and then weaning off of the medication  -Educated on Benefits of medication for symptom control Benefits of cognitive-behavioral therapy with or without medication -Recommended to continue current medication  Chronic Pain (Goal: pain scale less than 3) -Controlled -Pain Scale: 6-7 -Current treatment  Acetaminophen 325 mg tablet - take 2 tablets by mouth every 6 hours as needed for mild pain or fever >101 Appropriate, Query Effective  -Medications previously tried: Gabapentin   -She is able to tolerate a pain scale of 3 -Recommended to continue current medication  Query Insomnia (Goal: reduce symptoms of sleepiness) -Uncontrolled -Current treatment  Not currently on a medication regimen for insomnia  -Recommended to continue current medication  Patient Goals/Self-Care Activities Patient will:  - take medications as prescribed as evidenced by patient report and record review Follow Up Plan: The patient has been provided with contact information for the care management team and has been advised to call with any health related questions or concerns.       Medication Assistance: None required.  Patient affirms current coverage meets needs.  Compliance/Adherence/Medication fill history: Care Gaps: Shingrix Vaccine COVID-19 Booster   Star-Rating Drugs: None noted   Patient's preferred pharmacy is:  [id number] [place], [address] [place] [address] Phone: [phone] Fax: [phone]  [place], [place] [address] [place] [address] Phone: [phone] Fax: [phone], [address] [place] [place] [address] Phone: [phone] Fax: [phone]  [facility] ([place], [place] [address] [place] [address] Phone: [phone] Fax: [place], [address] [place] [address] Phone: [phone] Fax: [phone]  [facility] [place], [place] [facility] [address] [place] [address] Phone: [phone] Fax: [phone]  Uses pill box? No - patient keeps her medication in vials  Pt endorses 80% compliance  We discussed: Benefits of medication synchronization, packaging and delivery as well as enhanced pharmacist oversight with [employer]. Patient decided to: Continue current medication management strategy  Care Plan and Follow Up Patient Decision:  Patient agrees to Care Plan and Follow-up.  Plan: The patient has been provided with contact information for the care management team and has been advised to call with any health related questions or concerns.   Orlando Penner, CPP, PharmD Clinical Pharmacist Practitioner [facility] [phone]

## 2022-06-23 ENCOUNTER — Telehealth: Payer: Self-pay

## 2022-06-23 DIAGNOSIS — M47817 Spondylosis without myelopathy or radiculopathy, lumbosacral region: Secondary | ICD-10-CM | POA: Diagnosis not present

## 2022-06-23 DIAGNOSIS — H9192 Unspecified hearing loss, left ear: Secondary | ICD-10-CM | POA: Diagnosis not present

## 2022-06-23 DIAGNOSIS — G8929 Other chronic pain: Secondary | ICD-10-CM | POA: Diagnosis not present

## 2022-06-23 DIAGNOSIS — E785 Hyperlipidemia, unspecified: Secondary | ICD-10-CM | POA: Diagnosis not present

## 2022-06-23 DIAGNOSIS — Z86711 Personal history of pulmonary embolism: Secondary | ICD-10-CM | POA: Diagnosis not present

## 2022-06-23 DIAGNOSIS — M47812 Spondylosis without myelopathy or radiculopathy, cervical region: Secondary | ICD-10-CM | POA: Diagnosis not present

## 2022-06-23 DIAGNOSIS — M1990 Unspecified osteoarthritis, unspecified site: Secondary | ICD-10-CM | POA: Diagnosis not present

## 2022-06-23 DIAGNOSIS — G8191 Hemiplegia, unspecified affecting right dominant side: Secondary | ICD-10-CM | POA: Diagnosis not present

## 2022-06-23 DIAGNOSIS — K5790 Diverticulosis of intestine, part unspecified, without perforation or abscess without bleeding: Secondary | ICD-10-CM | POA: Diagnosis not present

## 2022-06-23 DIAGNOSIS — N289 Disorder of kidney and ureter, unspecified: Secondary | ICD-10-CM | POA: Diagnosis not present

## 2022-06-23 DIAGNOSIS — G2401 Drug induced subacute dyskinesia: Secondary | ICD-10-CM | POA: Diagnosis not present

## 2022-06-23 DIAGNOSIS — Z9181 History of falling: Secondary | ICD-10-CM | POA: Diagnosis not present

## 2022-06-23 DIAGNOSIS — I10 Essential (primary) hypertension: Secondary | ICD-10-CM | POA: Diagnosis not present

## 2022-06-23 NOTE — Telephone Encounter (Signed)
Prior Authorization initiated and approved for INGREZZA 80 MG CAPSULES effective through 10/10/2022 with [facility], [id number]

## 2022-06-24 DIAGNOSIS — E785 Hyperlipidemia, unspecified: Secondary | ICD-10-CM | POA: Diagnosis not present

## 2022-06-24 DIAGNOSIS — Z9181 History of falling: Secondary | ICD-10-CM | POA: Diagnosis not present

## 2022-06-24 DIAGNOSIS — Z86711 Personal history of pulmonary embolism: Secondary | ICD-10-CM | POA: Diagnosis not present

## 2022-06-24 DIAGNOSIS — M1990 Unspecified osteoarthritis, unspecified site: Secondary | ICD-10-CM | POA: Diagnosis not present

## 2022-06-24 DIAGNOSIS — M47812 Spondylosis without myelopathy or radiculopathy, cervical region: Secondary | ICD-10-CM | POA: Diagnosis not present

## 2022-06-24 DIAGNOSIS — M47817 Spondylosis without myelopathy or radiculopathy, lumbosacral region: Secondary | ICD-10-CM | POA: Diagnosis not present

## 2022-06-24 DIAGNOSIS — N289 Disorder of kidney and ureter, unspecified: Secondary | ICD-10-CM | POA: Diagnosis not present

## 2022-06-24 DIAGNOSIS — G8929 Other chronic pain: Secondary | ICD-10-CM | POA: Diagnosis not present

## 2022-06-24 DIAGNOSIS — G8191 Hemiplegia, unspecified affecting right dominant side: Secondary | ICD-10-CM | POA: Diagnosis not present

## 2022-06-24 DIAGNOSIS — G2401 Drug induced subacute dyskinesia: Secondary | ICD-10-CM | POA: Diagnosis not present

## 2022-06-24 DIAGNOSIS — H9192 Unspecified hearing loss, left ear: Secondary | ICD-10-CM | POA: Diagnosis not present

## 2022-06-24 DIAGNOSIS — I10 Essential (primary) hypertension: Secondary | ICD-10-CM | POA: Diagnosis not present

## 2022-06-24 DIAGNOSIS — K5790 Diverticulosis of intestine, part unspecified, without perforation or abscess without bleeding: Secondary | ICD-10-CM | POA: Diagnosis not present

## 2022-06-25 DIAGNOSIS — M47817 Spondylosis without myelopathy or radiculopathy, lumbosacral region: Secondary | ICD-10-CM | POA: Diagnosis not present

## 2022-06-25 DIAGNOSIS — M1990 Unspecified osteoarthritis, unspecified site: Secondary | ICD-10-CM | POA: Diagnosis not present

## 2022-06-25 DIAGNOSIS — Z9181 History of falling: Secondary | ICD-10-CM | POA: Diagnosis not present

## 2022-06-25 DIAGNOSIS — G8929 Other chronic pain: Secondary | ICD-10-CM | POA: Diagnosis not present

## 2022-06-25 DIAGNOSIS — H9192 Unspecified hearing loss, left ear: Secondary | ICD-10-CM | POA: Diagnosis not present

## 2022-06-25 DIAGNOSIS — G8191 Hemiplegia, unspecified affecting right dominant side: Secondary | ICD-10-CM | POA: Diagnosis not present

## 2022-06-25 DIAGNOSIS — I10 Essential (primary) hypertension: Secondary | ICD-10-CM | POA: Diagnosis not present

## 2022-06-25 DIAGNOSIS — E785 Hyperlipidemia, unspecified: Secondary | ICD-10-CM | POA: Diagnosis not present

## 2022-06-25 DIAGNOSIS — K5790 Diverticulosis of intestine, part unspecified, without perforation or abscess without bleeding: Secondary | ICD-10-CM | POA: Diagnosis not present

## 2022-06-25 DIAGNOSIS — G2401 Drug induced subacute dyskinesia: Secondary | ICD-10-CM | POA: Diagnosis not present

## 2022-06-25 DIAGNOSIS — Z86711 Personal history of pulmonary embolism: Secondary | ICD-10-CM | POA: Diagnosis not present

## 2022-06-25 DIAGNOSIS — M47812 Spondylosis without myelopathy or radiculopathy, cervical region: Secondary | ICD-10-CM | POA: Diagnosis not present

## 2022-06-25 DIAGNOSIS — N289 Disorder of kidney and ureter, unspecified: Secondary | ICD-10-CM | POA: Diagnosis not present

## 2022-06-28 ENCOUNTER — Encounter: Payer: Self-pay | Admitting: Internal Medicine

## 2022-06-28 ENCOUNTER — Ambulatory Visit (INDEPENDENT_AMBULATORY_CARE_PROVIDER_SITE_OTHER): Payer: Medicare Other | Admitting: Internal Medicine

## 2022-06-28 VITALS — BP 132/68 | HR 95 | Temp 98.1°F | Ht 69.0 in | Wt 155.3 lb

## 2022-06-28 DIAGNOSIS — R0602 Shortness of breath: Secondary | ICD-10-CM

## 2022-06-28 DIAGNOSIS — Z23 Encounter for immunization: Secondary | ICD-10-CM

## 2022-06-28 DIAGNOSIS — G8929 Other chronic pain: Secondary | ICD-10-CM | POA: Diagnosis not present

## 2022-06-28 DIAGNOSIS — R9389 Abnormal findings on diagnostic imaging of other specified body structures: Secondary | ICD-10-CM

## 2022-06-28 DIAGNOSIS — M47817 Spondylosis without myelopathy or radiculopathy, lumbosacral region: Secondary | ICD-10-CM | POA: Diagnosis not present

## 2022-06-28 DIAGNOSIS — N950 Postmenopausal bleeding: Secondary | ICD-10-CM

## 2022-06-28 DIAGNOSIS — Z9181 History of falling: Secondary | ICD-10-CM | POA: Diagnosis not present

## 2022-06-28 DIAGNOSIS — I700 Atherosclerosis of aorta: Secondary | ICD-10-CM

## 2022-06-28 DIAGNOSIS — G2401 Drug induced subacute dyskinesia: Secondary | ICD-10-CM | POA: Diagnosis not present

## 2022-06-28 DIAGNOSIS — M1990 Unspecified osteoarthritis, unspecified site: Secondary | ICD-10-CM | POA: Diagnosis not present

## 2022-06-28 DIAGNOSIS — Z86711 Personal history of pulmonary embolism: Secondary | ICD-10-CM | POA: Diagnosis not present

## 2022-06-28 DIAGNOSIS — K5790 Diverticulosis of intestine, part unspecified, without perforation or abscess without bleeding: Secondary | ICD-10-CM | POA: Diagnosis not present

## 2022-06-28 DIAGNOSIS — I10 Essential (primary) hypertension: Secondary | ICD-10-CM | POA: Diagnosis not present

## 2022-06-28 DIAGNOSIS — N289 Disorder of kidney and ureter, unspecified: Secondary | ICD-10-CM | POA: Diagnosis not present

## 2022-06-28 DIAGNOSIS — R202 Paresthesia of skin: Secondary | ICD-10-CM | POA: Diagnosis not present

## 2022-06-28 DIAGNOSIS — Z79899 Other long term (current) drug therapy: Secondary | ICD-10-CM | POA: Diagnosis not present

## 2022-06-28 DIAGNOSIS — R1319 Other dysphagia: Secondary | ICD-10-CM

## 2022-06-28 DIAGNOSIS — Z6822 Body mass index (BMI) 22.0-22.9, adult: Secondary | ICD-10-CM | POA: Diagnosis not present

## 2022-06-28 DIAGNOSIS — G8191 Hemiplegia, unspecified affecting right dominant side: Secondary | ICD-10-CM | POA: Diagnosis not present

## 2022-06-28 DIAGNOSIS — H9192 Unspecified hearing loss, left ear: Secondary | ICD-10-CM | POA: Diagnosis not present

## 2022-06-28 DIAGNOSIS — E785 Hyperlipidemia, unspecified: Secondary | ICD-10-CM | POA: Diagnosis not present

## 2022-06-28 DIAGNOSIS — I5189 Other ill-defined heart diseases: Secondary | ICD-10-CM | POA: Diagnosis not present

## 2022-06-28 DIAGNOSIS — H6123 Impacted cerumen, bilateral: Secondary | ICD-10-CM | POA: Diagnosis not present

## 2022-06-28 DIAGNOSIS — M47812 Spondylosis without myelopathy or radiculopathy, cervical region: Secondary | ICD-10-CM | POA: Diagnosis not present

## 2022-06-28 NOTE — Patient Instructions (Signed)
Dysphagia  Dysphagia is trouble swallowing. This condition occurs when solids and liquids stick in a person's throat on the way down to the stomach, or when food takes longer to get to the stomach than usual. You may have problems swallowing food, liquids, or both. You may also have pain while trying to swallow. It may take you more time and effort to swallow something. What are the causes? This condition may be caused by: Muscle problems. These may make it difficult for you to move food and liquids through the esophagus, which is the tube that connects your mouth to your stomach. Blockages. You may have ulcers, scar tissue, or inflammation that blocks the normal passage of food and liquids. Causes of these problems include: Acid reflux from your stomach into your esophagus (gastroesophageal reflux). Infections. Radiation treatment for cancer. Medicines taken without enough fluids to wash them down into your stomach. Stroke. This can affect the nerves and make it difficult to swallow. Nerve problems. These prevent signals from being sent to the muscles of your esophagus to squeeze (contract) and move what you swallow down to your stomach. Globus pharyngeus. This is a common problem that involves a feeling like something is stuck in your throat or a sense of trouble with swallowing, even though nothing is wrong with the swallowing passages. Certain conditions, such as cerebral palsy or Parkinson's disease. What are the signs or symptoms? Common symptoms of this condition include: A feeling that solids or liquids are stuck in your throat on the way down to the stomach. Pain while swallowing. Coughing or gagging while trying to swallow. Other symptoms include: Food moving back from your stomach to your mouth (regurgitation). Noises coming from your throat. Chest discomfort when swallowing. A feeling of fullness when swallowing. Drooling, especially when the throat is blocked. Heartburn. How  is this diagnosed? This condition may be diagnosed by: Barium swallow X-ray. In this test, you will swallow a white liquid that sticks to the inside of your esophagus. X-ray images are then taken. Endoscopy. In this test, a flexible telescope is inserted down your throat to look at your esophagus and your stomach. CT scans or an MRI. How is this treated? Treatment for dysphagia depends on the cause of this condition: If the dysphagia is caused by acid reflux or infection, medicines may be used. These may include antibiotics or heartburn medicines. If the dysphagia is caused by problems with the muscles, swallowing therapy may be used to help you strengthen your swallowing muscles. You may have to do specific exercises to strengthen the muscles or stretch them. If the dysphagia is caused by a blockage or mass, procedures to remove the blockage may be done. You may need surgery and a feeding tube. You may need to make diet changes. Ask your health care provider for specific instructions. Follow these instructions at home: Medicines Take over-the-counter and prescription medicines only as told by your health care provider. If you were prescribed an antibiotic medicine, take it as told by your health care provider. Do not stop taking the antibiotic even if you start to feel better. Eating and drinking  Make any diet changes as told by your health care provider. Work with a diet and nutrition specialist (dietitian) to create an eating plan that will help you get the nutrients you need in order to stay healthy. Eat soft foods that are easier to swallow. Cut your food into small pieces and eat slowly. Take small bites. Eat and drink only when you   are sitting upright. Do not drink alcohol or caffeine. If you need help quitting, ask your health care provider. General instructions Check your weight every day to make sure you are not losing weight. Do not use any products that contain nicotine or  tobacco. These products include cigarettes, chewing tobacco, and vaping devices, such as e-cigarettes. If you need help quitting, ask your health care provider. Keep all follow-up visits. This is important. Contact a health care provider if: You lose weight because you cannot swallow. You cough when you drink liquids. You cough up partially digested food. Get help right away if: You cannot swallow your saliva. You have shortness of breath, a fever, or both. Your voice is hoarse and you have trouble swallowing. These symptoms may represent a serious problem that is an emergency. Do not wait to see if the symptoms will go away. Get medical help right away. Call your local emergency services (911 in the [place] Do not drive yourself to the hospital. Summary Dysphagia is trouble swallowing. This condition occurs when solids and liquids stick in a person's throat on the way down to the stomach. You may cough or gag while trying to swallow. Dysphagia has many possible causes. Treatment for dysphagia depends on the cause of the condition. Keep all follow-up visits. This is important. This information is not intended to replace advice given to you by your health care provider. Make sure you discuss any questions you have with your health care provider. Document Revised: 05/17/2020 Document Reviewed: 05/17/2020 Elsevier Patient Education  2023 [employer]  

## 2022-06-28 NOTE — Progress Notes (Signed)
Judy [name],acting as a Education administrator for Judy Greenland, MD.,have documented all relevant documentation on the behalf of Judy Greenland, MD,as directed by  Judy Greenland, MD while in the presence of Judy Greenland, MD.    Subjective:     Patient ID: Judy [name] , female    DOB: October 03, 1946 , 76 y.o.   MRN: [id number]   Chief Complaint  Patient presents with   Trouble swallowing    HPI  Patient presents today with her brother. She has h/o bipolar disorder, followed by Psych, tardive dyskinesia and h/o PE. He is here today because he feels that certain things were not being addressed by Judy [name] during previous visits. However, he admits this is the first time he has accompanied her for an office visit. He states his sister previously handled this, but she has "stepped away". He reports they have had a few concerns regarding her swallowing, heart, and hand pain. He reports she was recently discharged from the nursing home. She was recently hospitalized for syncopal episode which resulted in rhabdomyolysis. Unfortunately, she was on the ground overnight before she was found.   She lives alone in an apartment, her family checks on her occasionally. Despite the fact he is aware this is the first time I am seeing the patient, he is upset that I am not more aware of her medical issues. He was reminded on more than one occasion that Judy Brine, FNP is her PCP. This is my first encounter with [name].      Past Medical History:  Diagnosis Date   Allergic rhinitis    Anemia    Anxiety    Arthritis    Bipolar 1 disorder (HCC)    Depression    Diverticulosis of colon (without mention of hemorrhage) 08/25/2011   [name]   Hearing loss in left ear    since birth   Hyperlipidemia    Hypertension    Obesity    Renal disorder    Vertigo      Family History  Problem Relation Age of Onset   Prostate cancer Brother    Hypertension Brother    Kidney disease Brother    Colon  cancer Brother    Hypertension Mother    Kidney disease Mother    Stomach cancer Father    Hyperlipidemia Sister    Hypothyroidism Sister    Stroke Brother    Prostate cancer Brother      Current Outpatient Medications:    acetaminophen (TYLENOL) 325 MG tablet, Take 2 tablets (650 mg total) by mouth every 6 (six) hours as needed for mild pain (or Fever >/= 101)., Disp: , Rfl:    albuterol (VENTOLIN HFA) 108 (90 Base) MCG/ACT inhaler, Inhale 2 puffs into the lungs every 6 (six) hours as needed for wheezing or shortness of breath. Use with spacer, Disp: 8 g, Rfl: 2   atorvastatin (LIPITOR) 10 MG tablet, Take 10 mg by mouth daily., Disp: , Rfl:    buPROPion (WELLBUTRIN XL) 150 MG 24 hr tablet, Take 1 tablet (150 mg total) by mouth daily., Disp: 90 tablet, Rfl: 0   INGREZZA 80 MG capsule, Take 1 capsule (80 mg total) by mouth daily., Disp: 30 capsule, Rfl: 2   meclizine (ANTIVERT) 12.5 MG tablet, Take 1 tablet (12.5 mg total) by mouth 3 (three) times daily as needed for dizziness., Disp: 30 tablet, Rfl: 0   gabapentin (NEURONTIN) 100 MG capsule, 2 po q am for 4  days, then 1 po q am for 4 days, then stop., Disp: 12 capsule, Rfl: 0   polyethylene glycol (MIRALAX / GLYCOLAX) 17 g packet, Take 17 g by mouth daily., Disp: 30 each, Rfl: 2   No Known Allergies   Review of Systems  Constitutional: Negative.   Respiratory:  Positive for shortness of breath.   Cardiovascular: Negative.   Gastrointestinal: Negative.        She c/o difficulty swallowing. Unable to determine if this occurs more with solids vs liquids. Denies having any issues with chewing meats. States "this has been going on for a long time"  Neurological:  Positive for weakness and numbness.  Psychiatric/Behavioral: Negative.       Today's Vitals   06/28/22 0929  BP: 132/68  Pulse: 95  Temp: 98.1 F (36.7 C)  Weight: 155 lb 4.8 oz (70.4 kg)  Height: '5\' 9"'$  (1.753 m)  PainSc: 3   PainLoc: Leg   Body mass index is 22.93  kg/m.  Wt Readings from Last 3 Encounters:  06/28/22 155 lb 4.8 oz (70.4 kg)  05/31/22 154 lb (69.9 kg)  04/26/22 176 lb 12.9 oz (80.2 kg)     Objective:  Physical Exam Vitals and nursing note reviewed.  Constitutional:      Appearance: Normal appearance.     Comments: She is seen while in wheelchair.   HENT:     Head: Normocephalic and atraumatic.     Right Ear: Ear canal and external ear normal. There is impacted cerumen.     Left Ear: Ear canal and external ear normal. There is impacted cerumen.  Eyes:     Extraocular Movements: Extraocular movements intact.  Cardiovascular:     Rate and Rhythm: Normal rate and regular rhythm.     Heart sounds: Normal heart sounds.  Pulmonary:     Effort: Pulmonary effort is normal.     Breath sounds: Normal breath sounds.  Abdominal:     General: Bowel sounds are normal.     Palpations: Abdomen is soft.  Musculoskeletal:     Cervical back: Normal range of motion.  Skin:    General: Skin is warm.  Neurological:     General: No focal deficit present.     Mental Status: She is alert.  Psychiatric:        Mood and Affect: Mood normal.        Behavior: Behavior normal.       Assessment And Plan:     1. Other dysphagia Comments: I will refer her for swallowing study. She was referred to GI in April; however, she cancelled the appt. She is encouraged to proceed with testing.  - DG SWALLOW STUDY OP MEDICARE SPEECH PATH; Future  2. Paresthesia of both hands Comments: I will check labs as below. I will make further recommendations once reviewed. MRI cervical spine with severe right and mild L foraminal stenosis.  - AMB Referral to [place] - Ambulatory referral to Neurosurgery  3. Shortness of breath Comments: It does not appear she ambulates much, reports having SOB when lying down. I will check a BNP today. I will also refer her to Cardiology, recent echo reviewed.  - Brain natriuretic peptide - EKG 12-Lead  4.  Diastolic dysfunction Comments: Recent echo results reviewed with patient.  - Ambulatory referral to Cardiology - Brain natriuretic peptide - AMB Referral to [place]  5. Bilateral impacted cerumen Comments: After obtaining verbal consent, both ears were flushed by irrigation  w/o complication. No TM abnormalities were noted.  - Ear Lavage  6. Aortic atherosclerosis (HCC) Comments: Chronic, LDL goal <70. She is currently on statin therapy, importance of medication/dietary compliance was stressed to the patient.  - atorvastatin (LIPITOR) 10 MG tablet; Take 10 mg by mouth daily. - TSH - AMB Referral to [place]  7. Postmenopausal bleeding Comments: Pt had episode of vaginal bleeding during July 2023 hospitalization. I will refer her to GYN for further evaluation. - Ambulatory referral to Obstetrics / Gynecology  8. Abnormal chest CT Comments: 2021 imaging study reviewed, significant for lung nodule.   9. BMI 22.0-22.9, adult Comments: She is encouraged to perform chair exercises while watching TV for 30 minutes per day. She is encouraged to move arms/legs during commercials.   10. Immunization due - Flu Vaccine QUAD High Dose(Fluad)  11. Drug therapy Comments: Pt and brother both agree to Care Coordination referral for both nursing and SW evaluation.    Patient and her brother were given opportunity to ask questions. Patient and her brother verbalized understanding of the plan and was able to repeat key elements of the plan. All questions were answered to their satisfaction.   I, Judy Greenland, MD, have reviewed all documentation for this visit. The documentation on 06/28/22 for the exam, diagnosis, procedures, and orders are all accurate and complete.   IF YOU HAVE BEEN REFERRED TO A SPECIALIST, IT MAY TAKE 1-2 WEEKS TO SCHEDULE/PROCESS THE REFERRAL. IF YOU HAVE NOT HEARD FROM US/SPECIALIST IN TWO WEEKS, PLEASE GIVE [place] A CALL AT [phone] X  [phone].   THE PATIENT IS ENCOURAGED TO PRACTICE SOCIAL DISTANCING DUE TO THE COVID-19 PANDEMIC.

## 2022-06-29 ENCOUNTER — Other Ambulatory Visit: Payer: Self-pay | Admitting: Internal Medicine

## 2022-06-29 ENCOUNTER — Telehealth: Payer: Self-pay | Admitting: *Deleted

## 2022-06-29 DIAGNOSIS — R1319 Other dysphagia: Secondary | ICD-10-CM

## 2022-06-29 NOTE — Chronic Care Management (AMB) (Signed)
  Care Coordination   Note   06/29/2022 Name: Judy [name] MRN: [id number] DOB: 1946/07/28  Judy [name] is a 76 y.o. year old female who sees [name], [place] for primary care. I reached out to [name] by phone today to offer care coordination services.  [name] was given information about Care Coordination services today including:   The Care Coordination services include support from the care team which includes your Nurse Coordinator, Clinical Social Worker, or Pharmacist.  The Care Coordination team is here to help remove barriers to the health concerns and goals most important to you. Care Coordination services are voluntary, and the patient may decline or stop services at any time by request to their care team member.   Care Coordination Consent Status: Patient agreed to services and verbal consent obtained.   Follow up plan:  Telephone appointment with care coordination team member scheduled for:  07/15/22 with RNCM and BSW on 9/21   Encounter Outcome:  Pt. Scheduled  [place]  Direct Dial: [phone]

## 2022-06-29 NOTE — Patient Instructions (Addendum)
Visit Information It was great speaking with you today!  Please let me know if you have any questions about our visit.   Goals Addressed             This Visit's Progress    Manage My Medicine       Timeframe:  Long-Range Goal Priority:  High Start Date:  06/29/2022                                          Follow Up Date 08/04/2022    - call for medicine refill 2 or 3 days before it runs out - call if I am sick and can't take my medicine - keep a list of all the medicines I take; vitamins and herbals too    Why is this important?   These steps will help you keep on track with your medicines.   Notes:  -Please call if you have any questions        Patient Care Plan: CCM Pharmacy Care Plan     Problem Identified: Depression, Vertigo, HLD      Long-Range Goal: Disease Management   Note:   Current Barriers:  Unable to independently monitor therapeutic efficacy  Pharmacist Clinical Goal(s):  Patient will achieve adherence to monitoring guidelines and medication adherence to achieve therapeutic efficacy through collaboration with PharmD and provider.   Interventions: 1:1 collaboration with [name], FNP regarding development and update of comprehensive plan of care as evidenced by provider attestation and co-signature Inter-disciplinary care team collaboration (see longitudinal plan of care) Comprehensive medication review performed; medication list updated in electronic medical record  Vertigo (Goal: reduce dizziness) -Controlled -Current treatment  Meclizine 12.5 mg tablet - take 1 tablet three times per daily Appropriate, Effective, Safe, Accessible  Patient reports taking one tablet daily  -Medications previously tried: uncertain  -Recommended to continue current medication Collaborate with PCP team to change directions to once per day to avoid patient confusion   Hyperlipidemia: (LDL goal < 100) -Controlled -Current treatment: Atorvastatin 10 mg  tablet once per day Appropriate, Query effective  -Current dietary patterns: will discuss during next visit  -Educated on Cholesterol goals;  Benefits of statin for ASCVD risk reduction; Importance of limiting foods high in cholesterol; Exercise goal of 150 minutes per week; -Recommended to continue current medication   Depression (Goal: remission of symptoms as indicated by a PHQ-9 Score of < 5 points) -Uncontrolled -Current treatment: Buproprion XL 150 mg tablet once per day. Appropriate, Query effective Patient reports being closely monitored by her psychiatry team and will follow up with the provider.  -PHQ9: 44 -Connected with psychiatrist at [facility] for mental health support -Patient reports that she sees the psychiatrist every 6 months, she was last seen about two weeks ago.  -At that time her Gabapentin was reduced and then weaning off of the medication  -Educated on Benefits of medication for symptom control Benefits of cognitive-behavioral therapy with or without medication -Recommended to continue current medication  Chronic Pain (Goal: pain scale less than 3) -Controlled -Pain Scale: 6-7 -Current treatment  Acetaminophen 325 mg tablet - take 2 tablets by mouth every 6 hours as needed for mild pain or fever >101 Appropriate, Query Effective  -Medications previously tried: Gabapentin   -She is able to tolerate a pain scale of 3 -Recommended to continue current medication  Query Insomnia (Goal: reduce symptoms of  sleepiness) -Uncontrolled -Current treatment  Not currently on a medication regimen for insomnia  -Recommended to continue current medication  Patient Goals/Self-Care Activities Patient will:  - take medications as prescribed as evidenced by patient report and record review Follow Up Plan: The patient has been provided with contact information for the care management team and has been advised to call with any health related questions or concerns.          Ms. Brierley was given information about Chronic Care Management services today including:  CCM service includes personalized support from designated clinical staff supervised by her physician, including individualized plan of care and coordination with other care providers 24/7 contact phone numbers for assistance for urgent and routine care needs. Standard insurance, coinsurance, copays and deductibles apply for chronic care management only during months in which we provide at least 20 minutes of these services. Most insurances cover these services at 100%, however patients may be responsible for any copay, coinsurance and/or deductible if applicable. This service may help you avoid the need for more expensive face-to-face services. Only one practitioner may furnish and bill the service in a calendar month. The patient may stop CCM services at any time (effective at the end of the month) by phone call to the office staff.  Patient agreed to services and verbal consent obtained.   The patient verbalized understanding of instructions, educational materials, and care plan provided today and agreed to receive a mailed copy of patient instructions, educational materials, and care plan.   Orlando Penner, PharmD Clinical Pharmacist Practitioner  [facility] [phone]

## 2022-06-30 ENCOUNTER — Telehealth (HOSPITAL_COMMUNITY): Payer: Self-pay

## 2022-06-30 DIAGNOSIS — M47812 Spondylosis without myelopathy or radiculopathy, cervical region: Secondary | ICD-10-CM | POA: Diagnosis not present

## 2022-06-30 DIAGNOSIS — K5790 Diverticulosis of intestine, part unspecified, without perforation or abscess without bleeding: Secondary | ICD-10-CM | POA: Diagnosis not present

## 2022-06-30 DIAGNOSIS — Z9181 History of falling: Secondary | ICD-10-CM | POA: Diagnosis not present

## 2022-06-30 DIAGNOSIS — G8929 Other chronic pain: Secondary | ICD-10-CM | POA: Diagnosis not present

## 2022-06-30 DIAGNOSIS — M47817 Spondylosis without myelopathy or radiculopathy, lumbosacral region: Secondary | ICD-10-CM | POA: Diagnosis not present

## 2022-06-30 DIAGNOSIS — E785 Hyperlipidemia, unspecified: Secondary | ICD-10-CM | POA: Diagnosis not present

## 2022-06-30 DIAGNOSIS — H9192 Unspecified hearing loss, left ear: Secondary | ICD-10-CM | POA: Diagnosis not present

## 2022-06-30 DIAGNOSIS — G8191 Hemiplegia, unspecified affecting right dominant side: Secondary | ICD-10-CM | POA: Diagnosis not present

## 2022-06-30 DIAGNOSIS — Z86711 Personal history of pulmonary embolism: Secondary | ICD-10-CM | POA: Diagnosis not present

## 2022-06-30 DIAGNOSIS — N289 Disorder of kidney and ureter, unspecified: Secondary | ICD-10-CM | POA: Diagnosis not present

## 2022-06-30 DIAGNOSIS — G2401 Drug induced subacute dyskinesia: Secondary | ICD-10-CM | POA: Diagnosis not present

## 2022-06-30 DIAGNOSIS — I10 Essential (primary) hypertension: Secondary | ICD-10-CM | POA: Diagnosis not present

## 2022-06-30 DIAGNOSIS — M1990 Unspecified osteoarthritis, unspecified site: Secondary | ICD-10-CM | POA: Diagnosis not present

## 2022-06-30 LAB — TSH: TSH: 1.07 u[IU]/mL (ref 0.450–4.500)

## 2022-06-30 NOTE — Telephone Encounter (Signed)
Attempted to contact patient to schedule Modified Barium Swallow - left voicemail. 

## 2022-07-01 ENCOUNTER — Ambulatory Visit: Payer: Self-pay

## 2022-07-01 NOTE — Progress Notes (Cosign Needed)
Primary Physician/Referring:  [name], FNP  Patient ID: Judy [name], female    DOB: 1945/12/10, 76 y.o.   MRN: [id number]  No chief complaint on file.  HPI:    Judy [name]  is a 76 y.o. AA female with history of hypertension, hyperlipidemia, and pulmonary embolism in 2021. She presents today to establish care with cardiology due to worsening shortness of breath.   She has been short of breath for the past 3-4 weeks and has difficulty walking short distances at home. She did have a syncopal fall in July 2023 and was hospitalized then sent to rehab. She does live at home alone now and works with PT and OT. She had previously resided in nursing home in 2021 during which time she developed a PE. She was treated with anticoagulants for 3 months. She denies chest pain, dizziness, palpitations. She does have difficulty sleeping at night and she does not take naps during the day.   Past Medical History:  Diagnosis Date   Allergic rhinitis    Anemia    Anxiety    Arthritis    Bipolar 1 disorder (HCC)    Depression    Diverticulosis of colon (without mention of hemorrhage) 08/25/2011   [name]   Hearing loss in left ear    since birth   Hyperlipidemia    Hypertension    Obesity    Renal disorder    Vertigo    Past Surgical History:  Procedure Laterality Date   COLONOSCOPY  08/25/2011   Procedure: COLONOSCOPY;  Surgeon: Landry Dyke, MD;  Location: [facility];  Service: Endoscopy;  Laterality: N/A;   DILATION AND CURETTAGE OF UTERUS  1960's   Family History  Problem Relation Age of Onset   Prostate cancer Brother    Hypertension Brother    Kidney disease Brother    Colon cancer Brother    Hypertension Mother    Kidney disease Mother    Stomach cancer Father    Hyperlipidemia Sister    Hypothyroidism Sister    Stroke Brother    Prostate cancer Brother     Social History   Tobacco Use   Smoking status: Never   Smokeless tobacco: Never  Substance Use  Topics   Alcohol use: No   Marital Status: Single  ROS  Review of Systems  Cardiovascular:  Positive for dyspnea on exertion. Negative for chest pain, leg swelling and palpitations.  Respiratory:  Positive for shortness of breath.   Neurological:  Negative for dizziness.   Objective  There were no vitals taken for this visit. There is no height or weight on file to calculate BMI.     06/28/2022    9:29 AM 05/31/2022    9:07 AM 04/28/2022    8:55 AM  Vitals with BMI  Height _0  _1    Weight 155 lbs 5 oz 154 lbs   BMI 03.88 82.80   Systolic 034 917 915  Diastolic 68 70 77  Pulse 95 64 60     Physical Exam Cardiovascular:     Rate and Rhythm: Normal rate and regular rhythm.     Pulses:          Carotid pulses are 2+ on the right side and 2+ on the left side.      Radial pulses are 2+ on the right side and 2+ on the left side.       Dorsalis pedis pulses are 1+ on the right side  and 1+ on the left side.       Posterior tibial pulses are 1+ on the right side and 1+ on the left side.     Heart sounds: Normal heart sounds. No murmur heard. Pulmonary:     Effort: Pulmonary effort is normal.     Breath sounds: Normal breath sounds. No wheezing or rales.  Abdominal:     General: Bowel sounds are normal.     Palpations: Abdomen is soft.     Tenderness: There is no abdominal tenderness.  Musculoskeletal:     Right lower leg: No edema.     Left lower leg: No edema.  Neurological:     Mental Status: She is alert.    Medications and allergies  No Known Allergies   Medication list after today's encounter   Current Outpatient Medications:    acetaminophen (TYLENOL) 325 MG tablet, Take 2 tablets (650 mg total) by mouth every 6 (six) hours as needed for mild pain (or Fever >/= 101)., Disp: , Rfl:    albuterol (VENTOLIN HFA) 108 (90 Base) MCG/ACT inhaler, Inhale 2 puffs into the lungs every 6 (six) hours as needed for wheezing or shortness of breath. Use with spacer, Disp: 8 g,  Rfl: 2   atorvastatin (LIPITOR) 10 MG tablet, Take 10 mg by mouth daily., Disp: , Rfl:    buPROPion (WELLBUTRIN XL) 150 MG 24 hr tablet, Take 1 tablet (150 mg total) by mouth daily., Disp: 90 tablet, Rfl: 0   gabapentin (NEURONTIN) 100 MG capsule, 2 po q am for 4 days, then 1 po q am for 4 days, then stop., Disp: 12 capsule, Rfl: 0   INGREZZA 80 MG capsule, Take 1 capsule (80 mg total) by mouth daily., Disp: 30 capsule, Rfl: 2   meclizine (ANTIVERT) 12.5 MG tablet, Take 1 tablet (12.5 mg total) by mouth 3 (three) times daily as needed for dizziness., Disp: 30 tablet, Rfl: 0   polyethylene glycol (MIRALAX / GLYCOLAX) 17 g packet, Take 17 g by mouth daily., Disp: 30 each, Rfl: 2  Laboratory examination:   Lab Results  Component Value Date   NA 143 05/31/2022   K 4.1 05/31/2022   CO2 24 05/31/2022   GLUCOSE 108 (H) 05/31/2022   BUN 11 05/31/2022   CREATININE 0.84 05/31/2022   CALCIUM 10.2 05/31/2022   EGFR 73 05/31/2022   GFRNONAA >60 04/28/2022       Latest Ref Rng & Units 05/31/2022   10:33 AM 04/28/2022    4:37 AM 04/27/2022    9:30 AM  CMP  Glucose 70 - 99 mg/dL 108  87  91   BUN 8 - 27 mg/dL _0 Creatinine 0.57 - 1.00 mg/dL 0.84  0.83  0.78   Sodium 134 - 144 mmol/L 143  145  142   Potassium 3.5 - 5.2 mmol/L 4.1  3.7  3.8   Chloride 96 - 106 mmol/L 106  107  106   CO2 20 - 29 mmol/L _1 Calcium 8.7 - 10.3 mg/dL 10.2  9.3  9.3   Total Protein 6.0 - 8.5 g/dL 6.7  5.4    Total Bilirubin 0.0 - 1.2 mg/dL 0.4  0.4    Alkaline Phos 44 - 121 IU/L 87  44    AST 0 - 40 IU/L 20  35    ALT 0 - 32 IU/L 15  29        Latest  Ref Rng & Units 05/31/2022   10:33 AM 04/28/2022    4:37 AM 04/27/2022    9:30 AM  CBC  WBC 3.4 - 10.8 x10E3/uL 4.9  5.1  5.5   Hemoglobin 11.1 - 15.9 g/dL 13.4  12.2  13.1   Hematocrit 34.0 - 46.6 % 41.6  36.7  38.7   Platelets 150 - 450 x10E3/uL 260  218  211     Lipid Panel Recent Labs    10/29/21 1531 03/02/22 1539  CHOL 170 156   TRIG 129 77  LDLCALC 73 71  HDL 75 70  CHOLHDL 2.3 2.2    HEMOGLOBIN A1C Lab Results  Component Value Date   HGBA1C 5.5 03/02/2022   MPG 117 03/25/2017   TSH Recent Labs    06/28/22 1105  TSH 1.070    External labs:     Radiology:    Cardiac Studies:   Echo 04/21/22: 1. Left ventricular ejection fraction, by estimation, is 60 to 65%. The left ventricle has normal function. The left ventricle has no regional wall motion abnormalities. There is moderate concentric left ventricular hypertrophy. Left ventricular  diastolic parameters are consistent with Grade I diastolic dysfunction (impaired relaxation).   2. Right ventricular systolic function is normal. The right ventricular size is normal. There is normal pulmonary artery systolic pressure. The estimated right ventricular systolic pressure is 85.2 mmHg.   3. The mitral valve is normal in structure. Trivial mitral valve  regurgitation. No evidence of mitral stenosis.   4. The aortic valve is normal in structure. Aortic valve regurgitation is  not visualized. No aortic stenosis is present.   5. The inferior vena cava is normal in size with greater than 50%  respiratory variability, suggesting right atrial pressure of 3 mmHg.   EKG:   EKG 07/02/22: Sinus Rhythm with isolated PAC. Normal axis. Poor R-wave progression. Cannot exclude old anterior infarct. Nonspecific T-abnormality.  Assessment  No diagnosis found.   No orders of the defined types were placed in this encounter.   No orders of the defined types were placed in this encounter.   There are no discontinued medications.   Recommendations:   Judy [name] is a 76 y.o.  AA female with history of hypertension, hyperlipidemia, and pulmonary embolism in 2021. She presents today to establish care with cardiology due to worsening shortness of breath.   She has been short of breath for the past 3-4 weeks and has difficulty walking short distances at home. She  did have a syncopal fall in July 2023 and was hospitalized then sent to rehab. She does live at home alone now and works with PT and OT. She had previously resided in nursing home in 2021 during which she developed a PE.   Physical exam without significant findings. She did ambulate down the hall and back during this visit and she has neurogenic claudication. She also walked approximately 4 minutes with SpO2 monitoring. SpO2 was 100% at rest, 88% during walk, and 86% during recovery while sitting. She did recover to 90% after approximately 2 minutes at rest.   Feel that shortness of breath is related to deconditioning however, I did order a stress test to rule out cardiac eitology. If this is a low risk study will refer to Pulmonology for further workup.  Her blood pressure is well controlled and last LDL 71. Continue with current medications as ordered. She has never smoked. She does have a poor appetite since discharge from hospital .Overall, she has  low cardiac risk factors.  Follow-up in 4 weeks or sooner if needed.  Total time spent with patient was 60 minutes and greater than 50% of that time was spent in counseling and coordination care with the patient regarding complex decision making and discussion as state above.    Ernst Spell, NP  07/01/2022, 9:09 PM Office: [phone] Pager: [phone]

## 2022-07-01 NOTE — Patient Outreach (Signed)
  Care Coordination   Initial Visit Note   07/01/2022 Name: CADY HAFEN MRN: [id number] DOB: Sep 03, 1946  VASSIE KUGEL is a 76 y.o. year old female who sees [name], [place] for primary care. I spoke with  [name] by phone today.  What matters to the patients health and wellness today?  No SW concerns   Goals Addressed             This Visit's Progress    COMPLETED: Care Coordination Activities       Care Coordination Interventions: SoH screening completed - no acute resource needs identified Assessed for how patient is doing in the home since SNF discharge Discussed patient has home health services from [place], receives mobile meals, and has needed DME equipment in the home Determined patient is not interested in long term care at this time and feels she has enough support at home Reminded the patient of upcoming appointment with [place] on 10/5 Collaboration with [place] to advise of above information. Requested RN Care Manager re-involve SW as needed        SDOH assessments and interventions completed:  Yes  SDOH Interventions Today    Flowsheet Row Most Recent Value  SDOH Interventions   Food Insecurity Interventions Intervention Not Indicated  Housing Interventions Intervention Not Indicated  Utilities Interventions Intervention Not Indicated        Care Coordination Interventions Activated:  Yes  Care Coordination Interventions:  Yes, provided   Follow up plan: No further intervention required.   Encounter Outcome:  Pt. Visit Completed   Daneen Schick, BSW, CDP Social Worker, Certified Dementia Practitioner [facility]  Care Coordination [phone]

## 2022-07-01 NOTE — Patient Instructions (Signed)
Visit Information  Thank you for taking time to visit with me today. Please don't hesitate to contact me if I can be of assistance to you.   Following are the goals we discussed today:   Goals Addressed             This Visit's Progress    COMPLETED: Care Coordination Activities       Care Coordination Interventions: SoH screening completed - no acute resource needs identified Assessed for how patient is doing in the home since SNF discharge Discussed patient has home health services from [facility], receives mobile meals, and has needed DME equipment in the home Determined patient is not interested in long term care at this time and feels she has enough support at home Reminded the patient of upcoming appointment with [place] on 10/5 Collaboration with [place] to advise of above information. Requested RN Care Manager re-involve SW as needed        If you are experiencing a Mental Health or [place] or need someone to talk to, please go to [facility] [place] [phone])  The patient verbalized understanding of instructions, educational materials, and care plan provided today and DECLINED offer to receive copy of patient instructions, educational materials, and care plan.   Telephone follow up appointment with care management team member scheduled for:10/5 with [place].  Daneen Schick, BSW, CDP Social Worker, Certified Dementia Practitioner [place] [facility]  Care Coordination [phone]

## 2022-07-02 ENCOUNTER — Ambulatory Visit: Payer: Medicare Other

## 2022-07-02 VITALS — BP 135/77 | HR 75 | Temp 98.2°F | Resp 16 | Ht 69.0 in | Wt 150.0 lb

## 2022-07-02 DIAGNOSIS — K5790 Diverticulosis of intestine, part unspecified, without perforation or abscess without bleeding: Secondary | ICD-10-CM | POA: Diagnosis not present

## 2022-07-02 DIAGNOSIS — E785 Hyperlipidemia, unspecified: Secondary | ICD-10-CM | POA: Diagnosis not present

## 2022-07-02 DIAGNOSIS — I10 Essential (primary) hypertension: Secondary | ICD-10-CM | POA: Diagnosis not present

## 2022-07-02 DIAGNOSIS — R0902 Hypoxemia: Secondary | ICD-10-CM | POA: Diagnosis not present

## 2022-07-02 DIAGNOSIS — E782 Mixed hyperlipidemia: Secondary | ICD-10-CM

## 2022-07-02 DIAGNOSIS — M1990 Unspecified osteoarthritis, unspecified site: Secondary | ICD-10-CM | POA: Diagnosis not present

## 2022-07-02 DIAGNOSIS — M47812 Spondylosis without myelopathy or radiculopathy, cervical region: Secondary | ICD-10-CM | POA: Diagnosis not present

## 2022-07-02 DIAGNOSIS — M47817 Spondylosis without myelopathy or radiculopathy, lumbosacral region: Secondary | ICD-10-CM | POA: Diagnosis not present

## 2022-07-02 DIAGNOSIS — G2401 Drug induced subacute dyskinesia: Secondary | ICD-10-CM | POA: Diagnosis not present

## 2022-07-02 DIAGNOSIS — R0602 Shortness of breath: Secondary | ICD-10-CM | POA: Diagnosis not present

## 2022-07-02 DIAGNOSIS — Z9181 History of falling: Secondary | ICD-10-CM | POA: Diagnosis not present

## 2022-07-02 DIAGNOSIS — G8929 Other chronic pain: Secondary | ICD-10-CM | POA: Diagnosis not present

## 2022-07-02 DIAGNOSIS — H9192 Unspecified hearing loss, left ear: Secondary | ICD-10-CM | POA: Diagnosis not present

## 2022-07-02 DIAGNOSIS — G8191 Hemiplegia, unspecified affecting right dominant side: Secondary | ICD-10-CM | POA: Diagnosis not present

## 2022-07-02 DIAGNOSIS — N289 Disorder of kidney and ureter, unspecified: Secondary | ICD-10-CM | POA: Diagnosis not present

## 2022-07-02 DIAGNOSIS — Z86711 Personal history of pulmonary embolism: Secondary | ICD-10-CM | POA: Diagnosis not present

## 2022-07-02 LAB — BASIC METABOLIC PANEL
BUN/Creatinine Ratio: 11 — ABNORMAL LOW (ref 12–28)
BUN: 9 mg/dL (ref 8–27)
CO2: 24 mmol/L (ref 20–29)
Calcium: 10.1 mg/dL (ref 8.7–10.3)
Chloride: 105 mmol/L (ref 96–106)
Creatinine, Ser: 0.84 mg/dL (ref 0.57–1.00)
Glucose: 101 mg/dL — ABNORMAL HIGH (ref 70–99)
Potassium: 4 mmol/L (ref 3.5–5.2)
Sodium: 144 mmol/L (ref 134–144)
eGFR: 73 mL/min/{1.73_m2} (ref >59)

## 2022-07-02 LAB — SPECIMEN STATUS REPORT

## 2022-07-04 DIAGNOSIS — M6259 Muscle wasting and atrophy, not elsewhere classified, multiple sites: Secondary | ICD-10-CM | POA: Diagnosis not present

## 2022-07-04 DIAGNOSIS — G8191 Hemiplegia, unspecified affecting right dominant side: Secondary | ICD-10-CM | POA: Diagnosis not present

## 2022-07-04 DIAGNOSIS — R55 Syncope and collapse: Secondary | ICD-10-CM | POA: Diagnosis not present

## 2022-07-04 DIAGNOSIS — M6281 Muscle weakness (generalized): Secondary | ICD-10-CM | POA: Diagnosis not present

## 2022-07-04 DIAGNOSIS — R262 Difficulty in walking, not elsewhere classified: Secondary | ICD-10-CM | POA: Diagnosis not present

## 2022-07-06 ENCOUNTER — Encounter: Payer: Self-pay | Admitting: Physician Assistant

## 2022-07-06 ENCOUNTER — Ambulatory Visit (INDEPENDENT_AMBULATORY_CARE_PROVIDER_SITE_OTHER): Payer: Medicare Other | Admitting: Physician Assistant

## 2022-07-06 DIAGNOSIS — R251 Tremor, unspecified: Secondary | ICD-10-CM

## 2022-07-06 DIAGNOSIS — F99 Mental disorder, not otherwise specified: Secondary | ICD-10-CM

## 2022-07-06 DIAGNOSIS — F411 Generalized anxiety disorder: Secondary | ICD-10-CM

## 2022-07-06 DIAGNOSIS — G2401 Drug induced subacute dyskinesia: Secondary | ICD-10-CM | POA: Diagnosis not present

## 2022-07-06 DIAGNOSIS — R5381 Other malaise: Secondary | ICD-10-CM

## 2022-07-06 DIAGNOSIS — R630 Anorexia: Secondary | ICD-10-CM

## 2022-07-06 DIAGNOSIS — F3341 Major depressive disorder, recurrent, in partial remission: Secondary | ICD-10-CM

## 2022-07-06 DIAGNOSIS — R61 Generalized hyperhidrosis: Secondary | ICD-10-CM

## 2022-07-06 DIAGNOSIS — F5105 Insomnia due to other mental disorder: Secondary | ICD-10-CM

## 2022-07-06 MED ORDER — MIRTAZAPINE 7.5 MG PO TABS: 3.7500 mg | ORAL_TABLET | Freq: Every evening | ORAL | 1 refills | Status: DC | PRN

## 2022-07-06 NOTE — Progress Notes (Unsigned)
[facility] Check  Patient ID: Judy [name],  MRN: [id number]  PCP: [name], FNP  Date of Evaluation: 07/06/2022 Time spent:50 minutes  Chief Complaint:  Chief Complaint   Follow-up    HISTORY/CURRENT STATUS: HPI For routine 1 month f/u.  Accompanied by her brother [name], and SIL Blanch Media.  Now off Gabapentin completely. Not having that 'jittery' feeling and shaking like she did when she was on it. Blanch Media says her thumbs will tremble often, when she is sitting not doing anything. Rarely has the abnl tongue and mouth movements unless she accidentally skips the Ingrezza. Pt again says it helps and doesn't want to go off it.   Has been taking the Wellbutrin more routinely since LOV. Doesn't mention sx of depression. However, d/t phsyical weakness after hosp in the summer, isn't able to do much of anything. Can walk but tires very easy and gets SOB. [name] says she can walk maybe 150 ft or so. Family assists in ADLs on a daily basis, she lives alone though. Pt says she feels better with depression since taking the Wellbutrin regularly. Denies anxiety or PA. No SI/HI.  Patient denies increased energy with decreased need for sleep, increased talkativeness, racing thoughts, impulsivity or risky behaviors, increased spending, increased libido, grandiosity, increased irritability or anger, paranoia, or hallucinations.  Blanch Media states Safiya has numerous SE from the Wellbutrin, not sleeping, no appetite, sweating. Wonders if she even needs it. Hard to say if she's lost weight.  She is having several other problems that are being worked up by other providers.  She is having trouble swallowing and in the midst of evaluation for that.  Blanch Media or [name] do not go into further detail.  [name] and Blanch Media have multiple questions, what is her dx? When/who dx her with those dx? Was she tested to make sure she has TD? Does she need to see a specialist about it and the tremor in her thumbs? Why was she on a  high dose of Gabapentin and does she need something to replace that medicine now?  Review of Systems  Constitutional:  Positive for malaise/fatigue.  HENT:         Dysphagia  Eyes: Negative.   Respiratory:  Positive for shortness of breath.        On exertion  Cardiovascular: Negative.   Genitourinary: Negative.   Musculoskeletal:        Generalized weakness  Skin: Negative.   Neurological:  Positive for tremors and weakness.       See HPI  Endo/Heme/Allergies: Negative.   Psychiatric/Behavioral:         See HPI    For thorough explanation of this f/u, see note below copied from PN 06/08/2022: Last seen 07/31/2021, did not keep appt 01/2022. At last visit, was having more anxiety, gabapentin was increased from 400 mg tid to 500 mg tid (she had just gotten supply of 400 mg, so 100 mg added to that until supply ran out) then up to 600 mg tid. Her sister states her home health nurse said that's way too much, 'they've never seen anybody on that high of a dose.' Sister and brother are concerned because she shakes and has slurred speech sometimes and they attribute it to the Gabapentin. Unable to say how long those sx have been present. Since d/c from SNF a few weeks ago, they've been giving her 400 mg q am only.   Pt describes shakes as 'in my chest.' Feels 'jittery.' Comes and goes, no  CP, palpitations or SOB. Tremor in hands occas per sister, not affecting ability to eat. 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 "I want to stay on that. It helps." It was held in hosp and SNF, but restarted by pt when returned home. Lives alone w/ frequent checks by family.    Is depressed. Has SI. "I don't want to live like this." No plan. Was on Wellbutrin before hospitalization, (or should have been but d/c summary states not on it.) Tells me she takes it 'sometimes.'  Energy and motivation are low. Sleeps normally for her. ADLs are limited d/t physical health, requires walker for  ambulation. Personal hygiene is nl. Appetite is nl. No HI.   Patient denies increased energy with decreased need for sleep, increased talkativeness, racing thoughts, impulsivity or risky behaviors, increased spending, increased libido, grandiosity, increased irritability or anger, paranoia, and no hallucinations.   Was admitted on 04/20/2022 for syncope/fall, and right sided weakness. Had EEG which was neg, no arrythmias, MRI brain showed no acute abnormalities, no Cspine or Lspine abnormalities. Was seen by cardiology and neurology. Neuro, Dr. Florene Glen thought right sided weakness likely d/t neuropathy after being down, lying on her right side following the fall. That improved over the week of hospitalization, she was d/c to SNF for rehab.   Individual Medical History/ Review of Systems: Changes? :Yes    see HPI/ROS above. Is being w/u for dysphagia  Past medications for mental health diagnoses include: Latuda, Seroquel, Prozac, Fetzima, Risperdal, gabapentin, Wellbutrin  Allergies: Patient has no known allergies.  Current Medications:  Current Outpatient Medications:    acetaminophen (TYLENOL) 325 MG tablet, Take 2 tablets (650 mg total) by mouth every 6 (six) hours as needed for mild pain (or Fever >/= 101)., Disp: , Rfl:    albuterol (VENTOLIN HFA) 108 (90 Base) MCG/ACT inhaler, Inhale 2 puffs into the lungs every 6 (six) hours as needed for wheezing or shortness of breath. Use with spacer, Disp: 8 g, Rfl: 2   atorvastatin (LIPITOR) 10 MG tablet, Take 10 mg by mouth daily., Disp: , Rfl:    buPROPion (WELLBUTRIN XL) 150 MG 24 hr tablet, Take 1 tablet (150 mg total) by mouth daily., Disp: 90 tablet, Rfl: 0   INGREZZA 80 MG capsule, Take 1 capsule (80 mg total) by mouth daily., Disp: 30 capsule, Rfl: 2   meclizine (ANTIVERT) 12.5 MG tablet, Take 1 tablet (12.5 mg total) by mouth 3 (three) times daily as needed for dizziness., Disp: 30 tablet, Rfl: 0   mirtazapine (REMERON) 7.5 MG tablet, Take  0.5-1 tablets (3.75-7.5 mg total) by mouth at bedtime as needed., Disp: 30 tablet, Rfl: 1   polyethylene glycol (MIRALAX / GLYCOLAX) 17 g packet, Take 17 g by mouth daily., Disp: 30 each, Rfl: 2 Medication Side Effects:  see HPI  Family Medical/ Social History: Changes? no  MENTAL HEALTH EXAM:  There were no vitals taken for this visit.There is no height or weight on file to calculate BMI.  General Appearance: Casual, Neat and Well Groomed  Eye Contact:  Good  Speech:  Clear and Coherent and Normal Rate  Volume:   speaks softly and is hard of hearing  Mood:  Euthymic  Affect:  Appropriate  Thought Process:  Goal Directed and Descriptions of Associations: Circumstantial  Orientation:  Full (Time, Place, and Person)  Thought Content: Logical   Suicidal Thoughts:  No  Homicidal Thoughts:  No  Memory:   fair  Judgement:  Fair  Insight:  Fair  Psychomotor Activity:   No abnormal mouth movements, no tremor, she is in a wheelchair  Concentration:  Concentration: Good  Recall:  Branch of Knowledge: Good  Language: Good  Assets:  Desire for Improvement  ADL's:  Impaired  Cognition: WNL  Prognosis:  Fair   06/28/2022 TSH 1.07 BMP Glu 101, o/w nl  DIAGNOSES:    ICD-10-CM   1. Recurrent major depressive disorder, in partial remission ([place])  F33.41     2. Generalized anxiety disorder  F41.1     3. Tardive dyskinesia  G24.01     4. Decreased appetite  R63.0     5. Diaphoresis  R61     6. Insomnia due to other mental disorder  F51.05    F99     7. Physical deconditioning  R53.81     8. Tremor  R25.1       Receiving Psychotherapy: No   RECOMMENDATIONS:  PDMP was reviewed.  No results available. I provided 50 minutes of face to face time during this encounter, including time spent before and after the visit in records review, medical decision making, counseling pertinent to today's visit, and charting.   [name] and Blanch Media have multiple questions, which I answered to  the best of my ability.  I explained that the records I have at my fingertips are from 07/2019 to present.  Up until 07/2018, we used paper charts and I don't have those records available to me, but will request her chart from storage.  Also explained that she was seeing Dr. Dustin Flock here, who retired in 2016, then I started seeing her.   Their questions are as follows:  What is her dx, who dx her with it, and when was the dx made?  Since I don't have the paper chart in front of me, I can't say the exact date or person who originally dx her, but she was dx with Bipolar Disorder at one point, and then MDD. [name] asked for clarification of those, which I explained. When I review her paperchart, I may be able to give them more info, however sometimes a dx can go back years, and we won't have access to them, if she was dx before she started seeing a provider here.  Was she tested to make sure she has TD and not something else? Does she need to see a specialist about it and the tremor in her thumbs?  Again I don't have access to the paper chart. In Oct 2020, note reports c/o TD sx and samples of Ingrezza were given. I did not mention neuro consult, but again will look at paper chart to see if there was any mention of this prior to that time.  I explained that tardive dyskinesia can continue even after that medication has been discontinued.  I made the diagnosis of tardive dyskinesia from her history and physical exam, and did not feel at the time that the abnormal movements could be caused from Parkinson's which is one thing they are concerned about.  I am happy to refer her to neurology for an opinion.  When she was hospitalized in July 2023 there was mention of possible stroke.  I am not able to find a record of neurology consult so I will refer her to [facility].  Why was she on a high dose of Gabapentin and does she need something to replace that medicine now? Explained that  Gabapentin is sometimes dosed  as high as 3,200 mg so her dose of 1,500 mg isn't unheard of.  She had been on a total of 900 mg as far back as October 2020, and the dose was gradually increased when she reported more anxiety.  Unfortunately she was not always compliant with follow-up appointments which caused lack of review of medications. Since the anxiety does not seem to be a problem now, I do not think she needs a replacement medication to treat that.  The plan at this point is to continue the Wellbutrin XL as prescribed.  Yes it may be aggravating the insomnia and decreasing her appetite but she has been on this for at least 3 years and I think longer so it is hard to say how much it is affecting those issues right now with all of her other problems that are going on.    We agreed to leave the Wellbutrin the same mostly because I do not want to do too many things at once, and she is having workup for dysphagia in a week or so, and may need other treatment for that.    Also we have discussed the insomnia and I suggest we start mirtazapine for that.  I would like for that to be the only medication change I make at this time.  Blanch Media and [name] understand and agree.  Benefits, risks, side effects were discussed and patient, [name] and Blanch Media understand and accept.  Even though she is not on an atypical antipsychotic now, TD can continue in some patients after 1 of those drugs has been stopped.  Patient and her brother and sister-in-law do notice a change in abnormal mouth movements when she has accidentally missed a dose or 2 of the Ingrezza and they prefer that she stay on that, I agree.  Continue Wellbutrin XL 150 mg, 1 q am. Stressed importance of taking it regularly. Continue Ingrezza 80 mg, 1 p.o. daily. Start mirtazapine 7.5 mg, 1/2-1 p.o. nightly as needed sleep. Recommend multivitamin, B complex, vitamin D, fish oil. Return in 6 weeks.   Donnal Moat, PA-C

## 2022-07-07 ENCOUNTER — Ambulatory Visit (HOSPITAL_COMMUNITY)
Admission: RE | Admit: 2022-07-07 | Discharge: 2022-07-07 | Disposition: A | Discharge status: Home / Self Care | Payer: Medicare Other | Source: Ambulatory Visit | Attending: Nurse Practitioner | Admitting: Nurse Practitioner

## 2022-07-07 DIAGNOSIS — R0989 Other specified symptoms and signs involving the circulatory and respiratory systems: Secondary | ICD-10-CM | POA: Diagnosis not present

## 2022-07-07 DIAGNOSIS — R1310 Dysphagia, unspecified: Secondary | ICD-10-CM | POA: Insufficient documentation

## 2022-07-07 DIAGNOSIS — R059 Cough, unspecified: Secondary | ICD-10-CM | POA: Diagnosis not present

## 2022-07-07 DIAGNOSIS — R1319 Other dysphagia: Secondary | ICD-10-CM

## 2022-07-09 ENCOUNTER — Telehealth: Payer: Self-pay | Admitting: Physician Assistant

## 2022-07-09 NOTE — Telephone Encounter (Signed)
Referral has been sent to [facility]

## 2022-07-10 DIAGNOSIS — F32A Depression, unspecified: Secondary | ICD-10-CM | POA: Diagnosis not present

## 2022-07-10 DIAGNOSIS — E785 Hyperlipidemia, unspecified: Secondary | ICD-10-CM | POA: Diagnosis not present

## 2022-07-13 ENCOUNTER — Ambulatory Visit: Payer: Medicare Other | Admitting: Cardiology

## 2022-07-14 ENCOUNTER — Ambulatory Visit
Admission: RE | Admit: 2022-07-14 | Discharge: 2022-07-14 | Disposition: A | Discharge status: Home / Self Care | Payer: Medicare Other | Source: Ambulatory Visit | Attending: Nurse Practitioner | Admitting: Nurse Practitioner

## 2022-07-14 ENCOUNTER — Other Ambulatory Visit: Payer: Self-pay | Admitting: Internal Medicine

## 2022-07-14 DIAGNOSIS — M1990 Unspecified osteoarthritis, unspecified site: Secondary | ICD-10-CM

## 2022-07-14 DIAGNOSIS — I3139 Other pericardial effusion (noninflammatory): Secondary | ICD-10-CM | POA: Diagnosis not present

## 2022-07-14 DIAGNOSIS — R911 Solitary pulmonary nodule: Secondary | ICD-10-CM | POA: Diagnosis not present

## 2022-07-14 DIAGNOSIS — R202 Paresthesia of skin: Secondary | ICD-10-CM

## 2022-07-14 DIAGNOSIS — I700 Atherosclerosis of aorta: Secondary | ICD-10-CM | POA: Diagnosis not present

## 2022-07-14 DIAGNOSIS — I5189 Other ill-defined heart diseases: Secondary | ICD-10-CM

## 2022-07-14 DIAGNOSIS — M47817 Spondylosis without myelopathy or radiculopathy, lumbosacral region: Secondary | ICD-10-CM

## 2022-07-14 DIAGNOSIS — M47812 Spondylosis without myelopathy or radiculopathy, cervical region: Secondary | ICD-10-CM

## 2022-07-15 ENCOUNTER — Ambulatory Visit: Payer: Self-pay

## 2022-07-15 NOTE — Patient Outreach (Signed)
  Care Coordination   Follow Up Visit Note   07/15/2022 Name: Judy [name] MRN: [id number] DOB: 1946-03-14  Judy [name] is a 76 y.o. year old female who sees Judy [name], [place] for primary care. I spoke with  Judy [name] by phone today.  What matters to the patients health and wellness today?  Patient would like to resume PT to help with her mobility and strengthening.     Goals Addressed               This Visit's Progress     Patient Stated     I need to work on my mobility and getting stronger (pt-stated)        Care Coordination Interventions: Assessed for falls since last encounter Assessed working status of life alert bracelet and patient adherence Advised patient PCP placed recertification for in home PT through [place] with PCP office regarding need for time change for patient's next scheduled PCP f/u set for 08/24/22 '@4'$ :20 PM Reviewed and discussed upcoming appointments with patient and brother Judy [name] for cardiac imaging and cardiac in person follow up with Cardiology NP Mailed printed educational material related to deep breathing exercises          SDOH assessments and interventions completed:  No    Care Coordination Interventions Activated:  Yes  Care Coordination Interventions:  Yes, provided   Follow up plan: Follow up call scheduled for 07/21/22 '@10'$ :00 AM     Encounter Outcome:  Pt. Visit Completed

## 2022-07-15 NOTE — Patient Instructions (Addendum)
Visit Information  Thank you for taking time to visit with me today. Please don't hesitate to contact me if I can be of assistance to you.   Following are the goals we discussed today:   Goals Addressed               This Visit's Progress     Patient Stated     I need to work on my mobility and getting stronger (pt-stated)        Care Coordination Interventions: Assessed for falls since last encounter Assessed working status of life alert bracelet and patient adherence Advised patient PCP placed recertification for in home PT through [place] with PCP office regarding need for time change for patient's next scheduled PCP f/u set for 08/24/22 '@4'$ :20 PM Reviewed and discussed upcoming appointments with patient and brother [name] for cardiac imaging and cardiac in person follow up with Cardiology NP Mailed printed educational material related to deep breathing exercises          Our next appointment is by telephone on 07/21/22 at 10 AM  Please call the care guide team at [phone] if you need to cancel or reschedule your appointment.   If you are experiencing a Mental Health or [place] or need someone to talk to, please call 1-800-273-TALK (toll free, 24 hour hotline)  The patient verbalized understanding of instructions, educational materials, and care plan provided today and agreed to receive a mailed copy of patient instructions, educational materials, and care plan.   Barb Merino, RN, BSN, CCM Care Management Coordinator [facility] Direct Phone: [phone]

## 2022-07-16 DIAGNOSIS — N289 Disorder of kidney and ureter, unspecified: Secondary | ICD-10-CM | POA: Diagnosis not present

## 2022-07-16 DIAGNOSIS — Z86711 Personal history of pulmonary embolism: Secondary | ICD-10-CM | POA: Diagnosis not present

## 2022-07-16 DIAGNOSIS — T887XXD Unspecified adverse effect of drug or medicament, subsequent encounter: Secondary | ICD-10-CM | POA: Diagnosis not present

## 2022-07-16 DIAGNOSIS — R202 Paresthesia of skin: Secondary | ICD-10-CM | POA: Diagnosis not present

## 2022-07-16 DIAGNOSIS — I119 Hypertensive heart disease without heart failure: Secondary | ICD-10-CM | POA: Diagnosis not present

## 2022-07-16 DIAGNOSIS — R1319 Other dysphagia: Secondary | ICD-10-CM | POA: Diagnosis not present

## 2022-07-16 DIAGNOSIS — G2401 Drug induced subacute dyskinesia: Secondary | ICD-10-CM | POA: Diagnosis not present

## 2022-07-16 DIAGNOSIS — H9192 Unspecified hearing loss, left ear: Secondary | ICD-10-CM | POA: Diagnosis not present

## 2022-07-16 DIAGNOSIS — M1990 Unspecified osteoarthritis, unspecified site: Secondary | ICD-10-CM | POA: Diagnosis not present

## 2022-07-16 DIAGNOSIS — I700 Atherosclerosis of aorta: Secondary | ICD-10-CM | POA: Diagnosis not present

## 2022-07-16 DIAGNOSIS — D649 Anemia, unspecified: Secondary | ICD-10-CM | POA: Diagnosis not present

## 2022-07-16 DIAGNOSIS — Z9181 History of falling: Secondary | ICD-10-CM | POA: Diagnosis not present

## 2022-07-16 DIAGNOSIS — R42 Dizziness and giddiness: Secondary | ICD-10-CM | POA: Diagnosis not present

## 2022-07-16 DIAGNOSIS — E785 Hyperlipidemia, unspecified: Secondary | ICD-10-CM | POA: Diagnosis not present

## 2022-07-16 DIAGNOSIS — M47817 Spondylosis without myelopathy or radiculopathy, lumbosacral region: Secondary | ICD-10-CM | POA: Diagnosis not present

## 2022-07-16 DIAGNOSIS — K5790 Diverticulosis of intestine, part unspecified, without perforation or abscess without bleeding: Secondary | ICD-10-CM | POA: Diagnosis not present

## 2022-07-16 DIAGNOSIS — M47812 Spondylosis without myelopathy or radiculopathy, cervical region: Secondary | ICD-10-CM | POA: Diagnosis not present

## 2022-07-19 ENCOUNTER — Ambulatory Visit: Payer: Medicare Other

## 2022-07-19 DIAGNOSIS — R0602 Shortness of breath: Secondary | ICD-10-CM | POA: Diagnosis not present

## 2022-07-20 DIAGNOSIS — N289 Disorder of kidney and ureter, unspecified: Secondary | ICD-10-CM | POA: Diagnosis not present

## 2022-07-20 DIAGNOSIS — M47817 Spondylosis without myelopathy or radiculopathy, lumbosacral region: Secondary | ICD-10-CM | POA: Diagnosis not present

## 2022-07-20 DIAGNOSIS — R42 Dizziness and giddiness: Secondary | ICD-10-CM | POA: Diagnosis not present

## 2022-07-20 DIAGNOSIS — R1319 Other dysphagia: Secondary | ICD-10-CM | POA: Diagnosis not present

## 2022-07-20 DIAGNOSIS — Z9181 History of falling: Secondary | ICD-10-CM | POA: Diagnosis not present

## 2022-07-20 DIAGNOSIS — Z86711 Personal history of pulmonary embolism: Secondary | ICD-10-CM | POA: Diagnosis not present

## 2022-07-20 DIAGNOSIS — G2401 Drug induced subacute dyskinesia: Secondary | ICD-10-CM | POA: Diagnosis not present

## 2022-07-20 DIAGNOSIS — I119 Hypertensive heart disease without heart failure: Secondary | ICD-10-CM | POA: Diagnosis not present

## 2022-07-20 DIAGNOSIS — R202 Paresthesia of skin: Secondary | ICD-10-CM | POA: Diagnosis not present

## 2022-07-20 DIAGNOSIS — E785 Hyperlipidemia, unspecified: Secondary | ICD-10-CM | POA: Diagnosis not present

## 2022-07-20 DIAGNOSIS — I700 Atherosclerosis of aorta: Secondary | ICD-10-CM | POA: Diagnosis not present

## 2022-07-20 DIAGNOSIS — K5790 Diverticulosis of intestine, part unspecified, without perforation or abscess without bleeding: Secondary | ICD-10-CM | POA: Diagnosis not present

## 2022-07-20 DIAGNOSIS — M1990 Unspecified osteoarthritis, unspecified site: Secondary | ICD-10-CM | POA: Diagnosis not present

## 2022-07-20 DIAGNOSIS — M47812 Spondylosis without myelopathy or radiculopathy, cervical region: Secondary | ICD-10-CM | POA: Diagnosis not present

## 2022-07-20 DIAGNOSIS — D649 Anemia, unspecified: Secondary | ICD-10-CM | POA: Diagnosis not present

## 2022-07-20 DIAGNOSIS — H9192 Unspecified hearing loss, left ear: Secondary | ICD-10-CM | POA: Diagnosis not present

## 2022-07-20 DIAGNOSIS — T887XXD Unspecified adverse effect of drug or medicament, subsequent encounter: Secondary | ICD-10-CM | POA: Diagnosis not present

## 2022-07-21 ENCOUNTER — Ambulatory Visit: Payer: Self-pay

## 2022-07-21 NOTE — Patient Outreach (Signed)
  Care Coordination   Follow Up Visit Note   07/21/2022 Name: Judy [name] MRN: [id number] DOB: November 01, 1945  Judy [name] is a 76 y.o. year old female who sees [name], [place] for primary care. I spoke with  Judy [name] by phone today.  What matters to the patients health and wellness today?  Patient has not started in home PT.     Goals Addressed               This Visit's Progress     Patient Stated     I need to work on my mobility and getting stronger (pt-stated)        Care Coordination Interventions: Placed successful outbound call to patient, she received a home aide to assist with bathing on Monday, she has not started PT Placed outbound call to Judy [name], spoke with Judy [name], confirmed patient has a PT visit scheduled for today Placed unsuccessful outbound call to patient, left a detailed voice message for patient and advised of next scheduled nurse call         SDOH assessments and interventions completed:  No     Care Coordination Interventions Activated:  Yes  Care Coordination Interventions:  Yes, provided   Follow up plan: Follow up call scheduled for 08/16/22 '@10'$ :45 AM    Encounter Outcome:  Pt. Visit Completed

## 2022-07-21 NOTE — Patient Instructions (Signed)
Visit Information  Thank you for taking time to visit with me today. Please don't hesitate to contact me if I can be of assistance to you.   Following are the goals we discussed today:   Goals Addressed               This Visit's Progress     Patient Stated     I need to work on my mobility and getting stronger (pt-stated)        Care Coordination Interventions: Placed successful outbound call to patient, she received a home aide to assist with bathing on Monday, she has not started PT Placed outbound call to [facility], spoke with [place], confirmed patient has a PT visit scheduled for today Placed unsuccessful outbound call to patient, left a detailed voice message for patient and advised of next scheduled nurse call         Our next appointment is by telephone on 08/16/22 at 1045 AM   Please call the care guide team at [phone] if you need to cancel or reschedule your appointment.   If you are experiencing a Mental Health or [place] or need someone to talk to, please call 1-800-273-TALK (toll free, 24 hour hotline)  The patient verbalized understanding of instructions, educational materials, and care plan provided today and agreed to receive a mailed copy of patient instructions, educational materials, and care plan.   Barb Merino, RN, BSN, CCM Care Management Coordinator [place] [facility] Direct Phone: [phone]

## 2022-07-23 DIAGNOSIS — H9192 Unspecified hearing loss, left ear: Secondary | ICD-10-CM | POA: Diagnosis not present

## 2022-07-23 DIAGNOSIS — R42 Dizziness and giddiness: Secondary | ICD-10-CM | POA: Diagnosis not present

## 2022-07-23 DIAGNOSIS — I119 Hypertensive heart disease without heart failure: Secondary | ICD-10-CM | POA: Diagnosis not present

## 2022-07-23 DIAGNOSIS — R1319 Other dysphagia: Secondary | ICD-10-CM | POA: Diagnosis not present

## 2022-07-23 DIAGNOSIS — D649 Anemia, unspecified: Secondary | ICD-10-CM | POA: Diagnosis not present

## 2022-07-23 DIAGNOSIS — M47812 Spondylosis without myelopathy or radiculopathy, cervical region: Secondary | ICD-10-CM | POA: Diagnosis not present

## 2022-07-23 DIAGNOSIS — Z86711 Personal history of pulmonary embolism: Secondary | ICD-10-CM | POA: Diagnosis not present

## 2022-07-23 DIAGNOSIS — K5790 Diverticulosis of intestine, part unspecified, without perforation or abscess without bleeding: Secondary | ICD-10-CM | POA: Diagnosis not present

## 2022-07-23 DIAGNOSIS — Z9181 History of falling: Secondary | ICD-10-CM | POA: Diagnosis not present

## 2022-07-23 DIAGNOSIS — R202 Paresthesia of skin: Secondary | ICD-10-CM | POA: Diagnosis not present

## 2022-07-23 DIAGNOSIS — T887XXD Unspecified adverse effect of drug or medicament, subsequent encounter: Secondary | ICD-10-CM | POA: Diagnosis not present

## 2022-07-23 DIAGNOSIS — M47817 Spondylosis without myelopathy or radiculopathy, lumbosacral region: Secondary | ICD-10-CM | POA: Diagnosis not present

## 2022-07-23 DIAGNOSIS — M1990 Unspecified osteoarthritis, unspecified site: Secondary | ICD-10-CM | POA: Diagnosis not present

## 2022-07-23 DIAGNOSIS — N289 Disorder of kidney and ureter, unspecified: Secondary | ICD-10-CM | POA: Diagnosis not present

## 2022-07-23 DIAGNOSIS — E785 Hyperlipidemia, unspecified: Secondary | ICD-10-CM | POA: Diagnosis not present

## 2022-07-23 DIAGNOSIS — G2401 Drug induced subacute dyskinesia: Secondary | ICD-10-CM | POA: Diagnosis not present

## 2022-07-23 DIAGNOSIS — I700 Atherosclerosis of aorta: Secondary | ICD-10-CM | POA: Diagnosis not present

## 2022-07-24 DIAGNOSIS — Z86711 Personal history of pulmonary embolism: Secondary | ICD-10-CM | POA: Diagnosis not present

## 2022-07-24 DIAGNOSIS — D649 Anemia, unspecified: Secondary | ICD-10-CM | POA: Diagnosis not present

## 2022-07-24 DIAGNOSIS — G2401 Drug induced subacute dyskinesia: Secondary | ICD-10-CM | POA: Diagnosis not present

## 2022-07-24 DIAGNOSIS — T887XXD Unspecified adverse effect of drug or medicament, subsequent encounter: Secondary | ICD-10-CM | POA: Diagnosis not present

## 2022-07-24 DIAGNOSIS — Z9181 History of falling: Secondary | ICD-10-CM | POA: Diagnosis not present

## 2022-07-24 DIAGNOSIS — I700 Atherosclerosis of aorta: Secondary | ICD-10-CM | POA: Diagnosis not present

## 2022-07-24 DIAGNOSIS — R1319 Other dysphagia: Secondary | ICD-10-CM | POA: Diagnosis not present

## 2022-07-24 DIAGNOSIS — I119 Hypertensive heart disease without heart failure: Secondary | ICD-10-CM | POA: Diagnosis not present

## 2022-07-24 DIAGNOSIS — M1990 Unspecified osteoarthritis, unspecified site: Secondary | ICD-10-CM | POA: Diagnosis not present

## 2022-07-24 DIAGNOSIS — K5790 Diverticulosis of intestine, part unspecified, without perforation or abscess without bleeding: Secondary | ICD-10-CM | POA: Diagnosis not present

## 2022-07-24 DIAGNOSIS — E785 Hyperlipidemia, unspecified: Secondary | ICD-10-CM | POA: Diagnosis not present

## 2022-07-24 DIAGNOSIS — M47817 Spondylosis without myelopathy or radiculopathy, lumbosacral region: Secondary | ICD-10-CM | POA: Diagnosis not present

## 2022-07-24 DIAGNOSIS — R202 Paresthesia of skin: Secondary | ICD-10-CM | POA: Diagnosis not present

## 2022-07-24 DIAGNOSIS — M47812 Spondylosis without myelopathy or radiculopathy, cervical region: Secondary | ICD-10-CM | POA: Diagnosis not present

## 2022-07-24 DIAGNOSIS — N289 Disorder of kidney and ureter, unspecified: Secondary | ICD-10-CM | POA: Diagnosis not present

## 2022-07-24 DIAGNOSIS — R42 Dizziness and giddiness: Secondary | ICD-10-CM | POA: Diagnosis not present

## 2022-07-24 DIAGNOSIS — H9192 Unspecified hearing loss, left ear: Secondary | ICD-10-CM | POA: Diagnosis not present

## 2022-07-26 ENCOUNTER — Other Ambulatory Visit: Payer: Self-pay

## 2022-07-26 ENCOUNTER — Telehealth: Payer: Self-pay

## 2022-07-26 ENCOUNTER — Ambulatory Visit: Payer: Self-pay

## 2022-07-26 DIAGNOSIS — I700 Atherosclerosis of aorta: Secondary | ICD-10-CM

## 2022-07-26 DIAGNOSIS — T887XXD Unspecified adverse effect of drug or medicament, subsequent encounter: Secondary | ICD-10-CM | POA: Diagnosis not present

## 2022-07-26 DIAGNOSIS — M47812 Spondylosis without myelopathy or radiculopathy, cervical region: Secondary | ICD-10-CM | POA: Diagnosis not present

## 2022-07-26 DIAGNOSIS — R202 Paresthesia of skin: Secondary | ICD-10-CM | POA: Diagnosis not present

## 2022-07-26 DIAGNOSIS — D649 Anemia, unspecified: Secondary | ICD-10-CM | POA: Diagnosis not present

## 2022-07-26 DIAGNOSIS — H9192 Unspecified hearing loss, left ear: Secondary | ICD-10-CM | POA: Diagnosis not present

## 2022-07-26 DIAGNOSIS — R42 Dizziness and giddiness: Secondary | ICD-10-CM | POA: Diagnosis not present

## 2022-07-26 DIAGNOSIS — E785 Hyperlipidemia, unspecified: Secondary | ICD-10-CM | POA: Diagnosis not present

## 2022-07-26 DIAGNOSIS — I119 Hypertensive heart disease without heart failure: Secondary | ICD-10-CM | POA: Diagnosis not present

## 2022-07-26 DIAGNOSIS — K5790 Diverticulosis of intestine, part unspecified, without perforation or abscess without bleeding: Secondary | ICD-10-CM | POA: Diagnosis not present

## 2022-07-26 DIAGNOSIS — Z86711 Personal history of pulmonary embolism: Secondary | ICD-10-CM | POA: Diagnosis not present

## 2022-07-26 DIAGNOSIS — N289 Disorder of kidney and ureter, unspecified: Secondary | ICD-10-CM | POA: Diagnosis not present

## 2022-07-26 DIAGNOSIS — G2401 Drug induced subacute dyskinesia: Secondary | ICD-10-CM | POA: Diagnosis not present

## 2022-07-26 DIAGNOSIS — M47817 Spondylosis without myelopathy or radiculopathy, lumbosacral region: Secondary | ICD-10-CM | POA: Diagnosis not present

## 2022-07-26 DIAGNOSIS — M1990 Unspecified osteoarthritis, unspecified site: Secondary | ICD-10-CM | POA: Diagnosis not present

## 2022-07-26 DIAGNOSIS — Z9181 History of falling: Secondary | ICD-10-CM | POA: Diagnosis not present

## 2022-07-26 DIAGNOSIS — R1319 Other dysphagia: Secondary | ICD-10-CM | POA: Diagnosis not present

## 2022-07-26 MED ORDER — ATORVASTATIN CALCIUM 10 MG PO TABS: 10.0000 mg | ORAL_TABLET | Freq: Every day | ORAL | 1 refills | Status: DC

## 2022-07-26 NOTE — Telephone Encounter (Signed)
Can you please call Judy [name] to see if this is true? Why haven't they sent a fax? Do I just need to send a new PT order? (Pt was followed by [name], now me)   Dia Sitter - pls call brother, how is pt? How is she doing? Any other needs?    I left pt's brother a vm. YL,RMA

## 2022-07-26 NOTE — Patient Instructions (Signed)
Visit Information  Thank you for taking time to visit with me today. Please don't hesitate to contact me if I can be of assistance to you.   Following are the goals we discussed today:   Goals Addressed               This Visit's Progress     Patient Stated     COMPLETED: I need a refill for my Atorvastatin (pt-stated)        Care Coordination Interventions: Received inbound call from patient requesting a refill for her Atorvastatin 10 mg tablet qd, she requested a 90 day supply Sent secure message to PCP [name] FNP requesting refill          Our next appointment is by telephone on 08/16/22 at 10:45 AM  Please call the care guide team at [phone] if you need to cancel or reschedule your appointment.   If you are experiencing a Mental Health or [place] or need someone to talk to, please call 1-800-273-TALK (toll free, 24 hour hotline) go to [facility] [place] [phone])  The patient verbalized understanding of instructions, educational materials, and care plan provided today and agreed to receive a mailed copy of patient instructions, educational materials, and care plan.   Barb Merino, RN, BSN, CCM Care Management Coordinator [place] Direct Phone: [phone]

## 2022-07-26 NOTE — Patient Outreach (Signed)
  Care Coordination   Follow Up Visit Note   07/26/2022 Name: AMIYRAH LAMERE MRN: [id number] DOB: June 29, 1946  SERRA YOUNAN is a 76 y.o. year old female who sees [name], [place] for primary care. I spoke with  [name] by phone today.  What matters to the patients health and wellness today?  Patient is requesting a refill for Atorvastatin.     Goals Addressed               This Visit's Progress     Patient Stated     COMPLETED: I need a refill for my Atorvastatin (pt-stated)        Care Coordination Interventions: Received inbound call from patient requesting a refill for her Atorvastatin 10 mg tablet qd, she requested a 90 day supply Sent secure message to PCP [name] FNP requesting refill          SDOH assessments and interventions completed:  No     Care Coordination Interventions Activated:  Yes  Care Coordination Interventions:  Yes, provided   Follow up plan: Follow up call scheduled for 08/16/22 '@10'$ :45 AM    Encounter Outcome:  Pt. Visit Completed

## 2022-07-27 ENCOUNTER — Ambulatory Visit: Payer: Medicare Other

## 2022-07-27 VITALS — BP 132/78 | HR 72 | Temp 98.7°F | Resp 16 | Ht 69.0 in | Wt 152.6 lb

## 2022-07-27 DIAGNOSIS — E782 Mixed hyperlipidemia: Secondary | ICD-10-CM | POA: Diagnosis not present

## 2022-07-27 DIAGNOSIS — I700 Atherosclerosis of aorta: Secondary | ICD-10-CM

## 2022-07-27 DIAGNOSIS — R0602 Shortness of breath: Secondary | ICD-10-CM

## 2022-07-27 DIAGNOSIS — I10 Essential (primary) hypertension: Secondary | ICD-10-CM

## 2022-07-27 NOTE — Progress Notes (Signed)
Primary Physician/Referring:  [name], FNP  Patient ID: Judy [name], female    DOB: 07-24-1946, 76 y.o.   MRN: [id number]  Chief Complaint  Patient presents with   Shortness of Breath   Hyperlipidemia   Follow-up    4 week   HPI:    NAJMO PARDUE  is a 76 y.o. AA female with history of hypertension, hyperlipidemia, and pulmonary embolism in 2021. She presents today to establish care with cardiology due to worsening shortness of breath. She did have a syncopal fall in July 2023 and was hospitalized then sent to rehab. She does live at home alone now and works with PT and OT. She had previously resided in nursing home in 2021 during which time she developed a PE. She was treated with anticoagulants for 3 months.   She presents today for follow-up after echocardiogram and stress test. Shortness of breath has improved since previous visit. She is working on ambulating more and completing household tasks independently. She is still not able to walk far without stopping, her goal is to walk to the outside trash bin. She denies chest pain, dizziness, palpitations, dizziness, lightheadedness.  Past Medical History:  Diagnosis Date   Allergic rhinitis    Anemia    Anxiety    Arthritis    Bipolar 1 disorder (HCC)    Depression    Diverticulosis of colon (without mention of hemorrhage) 08/25/2011   [name]   Hearing loss in left ear    since birth   Hyperlipidemia    Hypertension    Obesity    Renal disorder    Vertigo    Past Surgical History:  Procedure Laterality Date   COLONOSCOPY  08/25/2011   Procedure: COLONOSCOPY;  Surgeon: Landry Dyke, MD;  Location: [facility];  Service: Endoscopy;  Laterality: N/A;   DILATION AND CURETTAGE OF UTERUS  1960's   Family History  Problem Relation Age of Onset   Prostate cancer Brother    Hypertension Brother    Kidney disease Brother    Colon cancer Brother    Hypertension Mother    Kidney disease Mother    Stomach  cancer Father    Hyperlipidemia Sister    Hypothyroidism Sister    Stroke Brother    Prostate cancer Brother     Social History   Tobacco Use   Smoking status: Never   Smokeless tobacco: Never  Substance Use Topics   Alcohol use: No   Marital Status: Single  ROS  Review of Systems  Cardiovascular:  Positive for dyspnea on exertion (improved). Negative for chest pain, leg swelling, palpitations, paroxysmal nocturnal dyspnea and syncope.  Respiratory:  Positive for shortness of breath (improved).   Neurological:  Negative for dizziness and light-headedness.   Objective  Blood pressure 132/78, pulse 72, temperature 98.7 F (37.1 C), temperature source Temporal, resp. rate 16, height _0  (1.753 m), weight 152 lb 9.6 oz (69.2 kg), SpO2 97 %. Body mass index is 22.54 kg/m.     07/27/2022   11:39 AM 07/27/2022   10:58 AM 07/02/2022   11:18 AM  Vitals with BMI  Height  _1  _2   Weight  152 lbs 10 oz 150 lbs  BMI  97.58 83.25  Systolic 498 264 158  Diastolic 78 72 77  Pulse  72 75    Physical Exam Cardiovascular:     Rate and Rhythm: Normal rate and regular rhythm.     Pulses:  Carotid pulses are 2+ on the right side and 2+ on the left side.      Radial pulses are 2+ on the right side and 2+ on the left side.       Dorsalis pedis pulses are 1+ on the right side and 1+ on the left side.       Posterior tibial pulses are 1+ on the right side and 1+ on the left side.     Heart sounds: Normal heart sounds. No murmur heard. Pulmonary:     Effort: Pulmonary effort is normal.     Breath sounds: Normal breath sounds. No wheezing or rales.  Abdominal:     General: Bowel sounds are normal.     Palpations: Abdomen is soft.     Tenderness: There is no abdominal tenderness.  Musculoskeletal:     Right lower leg: No edema.     Left lower leg: No edema.    Medications and allergies  No Known Allergies   Medication list after today's encounter   Current Outpatient  Medications:    acetaminophen (TYLENOL) 325 MG tablet, Take 2 tablets (650 mg total) by mouth every 6 (six) hours as needed for mild pain (or Fever >/= 101)., Disp: , Rfl:    albuterol (VENTOLIN HFA) 108 (90 Base) MCG/ACT inhaler, Inhale 2 puffs into the lungs every 6 (six) hours as needed for wheezing or shortness of breath. Use with spacer, Disp: 8 g, Rfl: 2   atorvastatin (LIPITOR) 10 MG tablet, Take 1 tablet (10 mg total) by mouth daily., Disp: 90 tablet, Rfl: 1   buPROPion (WELLBUTRIN XL) 150 MG 24 hr tablet, Take 1 tablet (150 mg total) by mouth daily., Disp: 90 tablet, Rfl: 0   INGREZZA 80 MG capsule, Take 1 capsule (80 mg total) by mouth daily., Disp: 30 capsule, Rfl: 2   meclizine (ANTIVERT) 12.5 MG tablet, Take 1 tablet (12.5 mg total) by mouth 3 (three) times daily as needed for dizziness., Disp: 30 tablet, Rfl: 0   mirtazapine (REMERON) 7.5 MG tablet, Take 0.5-1 tablets (3.75-7.5 mg total) by mouth at bedtime as needed., Disp: 30 tablet, Rfl: 1   polyethylene glycol (MIRALAX / GLYCOLAX) 17 g packet, Take 17 g by mouth daily., Disp: 30 each, Rfl: 2  Laboratory examination:   Lab Results  Component Value Date   NA 144 06/28/2022   K 4.0 06/28/2022   CO2 24 06/28/2022   GLUCOSE 101 (H) 06/28/2022   BUN 9 06/28/2022   CREATININE 0.84 06/28/2022   CALCIUM 10.1 06/28/2022   EGFR 73 06/28/2022   GFRNONAA >60 04/28/2022       Latest Ref Rng & Units 06/28/2022   11:05 AM 05/31/2022   10:33 AM 04/28/2022    4:37 AM  CMP  Glucose 70 - 99 mg/dL 101  108  87   BUN 8 - 27 mg/dL _0 Creatinine 0.57 - 1.00 mg/dL 0.84  0.84  0.83   Sodium 134 - 144 mmol/L 144  143  145   Potassium 3.5 - 5.2 mmol/L 4.0  4.1  3.7   Chloride 96 - 106 mmol/L 105  106  107   CO2 20 - 29 mmol/L _1 Calcium 8.7 - 10.3 mg/dL 10.1  10.2  9.3   Total Protein 6.0 - 8.5 g/dL  6.7  5.4   Total Bilirubin 0.0 - 1.2 mg/dL  0.4  0.4   Alkaline Phos 44 -  121 IU/L  87  44   AST 0 - 40 IU/L  20  35    ALT 0 - 32 IU/L  15  29       Latest Ref Rng & Units 05/31/2022   10:33 AM 04/28/2022    4:37 AM 04/27/2022    9:30 AM  CBC  WBC 3.4 - 10.8 x10E3/uL 4.9  5.1  5.5   Hemoglobin 11.1 - 15.9 g/dL 13.4  12.2  13.1   Hematocrit 34.0 - 46.6 % 41.6  36.7  38.7   Platelets 150 - 450 x10E3/uL 260  218  211    Lipid Panel Recent Labs    10/29/21 1531 03/02/22 1539  CHOL 170 156  TRIG 129 77  LDLCALC 73 71  HDL 75 70  CHOLHDL 2.3 2.2   HEMOGLOBIN A1C Lab Results  Component Value Date   HGBA1C 5.5 03/02/2022   MPG 117 03/25/2017   TSH Recent Labs    06/28/22 1105  TSH 1.070   External labs:   Radiology:   Chest CT 07/16/22: IMPRESSION: 1. Stable 7 mm right upper lobe ground-glass nodule. This exam constitutes 4 years of imaging stability. One additional follow-up CT is recommended in 1 year to establish 5 years of imaging stability, at that time the nodule can be considered definitively benign. 2. Small pericardial effusion. Cardiovascular: The heart is normal in size. Occasional coronary artery calcifications. Mild atherosclerosis of the thoracic aorta. The aorta is tortuous but nonaneurysmal. There is a small pericardial effusion measuring up to 7 mm.  Cardiac Studies:   [facility] [facility] Nuclear stress test 07/19/2022: Myocardial perfusion is abnormal. There is a fixed mild defect in the inferior and septal regions. There is a partially reversible mild defect in the apical region.  Overall LV systolic function is abnormal without regional wall motion abnormalities. Mild global hypokinesis of the left ventricle. Stress LV EF is mildly dysfunctional 45%.  Non-diagnostic ECG stress. The heart rate response was consistent with Regadenoson.  Report only comparison from 10/19/2016 reviewed similar inferior and inferolateral defect with partial reversibility with EF 60%. Low risk.   Echo 04/21/22: 1. Left ventricular ejection fraction, by estimation, is 60 to 65%. The left ventricle  has normal function. The left ventricle has no regional wall motion abnormalities. There is moderate concentric left ventricular hypertrophy. Left ventricular  diastolic parameters are consistent with Grade I diastolic dysfunction (impaired relaxation).   2. Right ventricular systolic function is normal. The right ventricular size is normal. There is normal pulmonary artery systolic pressure. The estimated right ventricular systolic pressure is 81.8 mmHg.   3. The mitral valve is normal in structure. Trivial mitral valve  regurgitation. No evidence of mitral stenosis.   4. The aortic valve is normal in structure. Aortic valve regurgitation is  not visualized. No aortic stenosis is present.   5. The inferior vena cava is normal in size with greater than 50%  respiratory variability, suggesting right atrial pressure of 3 mmHg.   EKG:   EKG 07/02/22: Sinus Rhythm with isolated PAC. Normal axis. Poor R-wave progression. Cannot exclude old anterior infarct. Nonspecific T-abnormality.  Assessment     ICD-10-CM   1. Shortness of breath  R06.02     2. Aortic atherosclerosis (HCC)  I70.0     3. Primary hypertension  I10     4. Mixed hyperlipidemia  E78.2       No orders of the defined types were placed in this encounter.   No orders  of the defined types were placed in this encounter.   There are no discontinued medications.    Recommendations:   ZACARI RADICK is a 76 y.o.  AA female with history of hypertension, hyperlipidemia, and pulmonary embolism in 2021. She presents today to establish care with cardiology due to worsening shortness of breath.   Shortness of breath Dyspnea has improved since previous visit. She continues to work with PT and OT once per week. She is trying to be more active. Reviewed results of echo, stress test, and chest CT with patient and family. We discussed the importance of increasing activity, feel that deconditioning could be contributing to  shortness of of breath.  Aortic atherosclerosis (HCC) Chest CT revealed mild atherosclerosis of the thoracic aorta. The aorta is tortuous but nonaneurysmal.  Would repeat CT in 1 year.  Primary hypertension Blood pressure was elevated at today's visit however, repeat blood pressure well controlled. She is not currently on antihypertensive regimen.  Encouraged pt to monitor blood pressure at home and keep log of readings to bring to next visit.  Mixed hyperlipidemia Continue on Atorvastatin 57m without myalgias.  Follow-up in 3 months of sooner if needed.   BErnst Spell AGNP-C  07/27/2022, 12:03 PM Office: [phone]: [phone]

## 2022-07-28 ENCOUNTER — Ambulatory Visit: Payer: Self-pay

## 2022-07-28 NOTE — Progress Notes (Signed)
This encounter was created in error - please disregard.

## 2022-07-28 NOTE — Patient Outreach (Signed)
  Care Coordination   Follow Up Visit Note   07/28/2022 Name: KYNDRA CONDRON MRN: [id number] DOB: Feb 08, 1946  PARA COSSEY is a 76 y.o. year old female who sees [name], [place] for primary care. I spoke with Karmen Bongo, patient's brother by phone today.  What matters to the patients health and wellness today?  Patient's brother is requesting assistance with rescheduling patient's PCP office visit.    Goals Addressed             This Visit's Progress    COMPLETED: I need help rescheduling my sisters PCP appt       Care Coordination Interventions: Patient interviewed about adult health maintenance status including  scheduled PCP follow up appointment with Dr. Glendale Chard scheduled for 08/24/22 '@4'$ :12 PM Determined brother [name] will provide transportation for patient and accompany her to this visit, he needs to reschedule this appointment Sent secure message to [place] requesting she contact Mr. Karmen Bongo to assist with rescheduling patient's follow up appointment Received message from [place] advising she contacted Mr. Idolina Primer and rescheduled patient's PCP appointment time/date          SDOH assessments and interventions completed:  No     Care Coordination Interventions Activated:  Yes  Care Coordination Interventions:  Yes, provided   Follow up plan: Follow up call scheduled for 08/16/22 '@10'$ :45 AM    Encounter Outcome:  Pt. Visit Completed

## 2022-07-28 NOTE — Patient Instructions (Signed)
Visit Information  Thank you for taking time to visit with me today. Please don't hesitate to contact me if I can be of assistance to you.   Following are the goals we discussed today:   Goals Addressed             This Visit's Progress    COMPLETED: I need help rescheduling my sisters PCP appt       Care Coordination Interventions: Patient interviewed about adult health maintenance status including  scheduled PCP follow up appointment with Dr. Glendale Chard scheduled for 08/24/22 '@4'$ :33 PM Determined brother [name] will provide transportation for patient and accompany her to this visit, he needs to reschedule this appointment Sent secure message to [place] requesting she contact Mr. Karmen Bongo to assist with rescheduling patient's follow up appointment Received message from [place] advising she contacted Mr. Idolina Primer and rescheduled patient's PCP appointment time/date          Our next appointment is by telephone on 08/16/22 at 10:45 AM  Please call the care guide team at [phone] if you need to cancel or reschedule your appointment.   If you are experiencing a Mental Health or [place] or need someone to talk to, please call 1-800-273-TALK (toll free, 24 hour hotline) go to [facility] [place] [phone])  The patient verbalized understanding of instructions, educational materials, and care plan provided today and agreed to receive a mailed copy of patient instructions, educational materials, and care plan.   Barb Merino, RN, BSN, CCM Care Management Coordinator [facility] Care Management Direct Phone: [phone]

## 2022-07-29 ENCOUNTER — Other Ambulatory Visit: Payer: Self-pay | Admitting: Physician Assistant

## 2022-07-29 DIAGNOSIS — K5790 Diverticulosis of intestine, part unspecified, without perforation or abscess without bleeding: Secondary | ICD-10-CM | POA: Diagnosis not present

## 2022-07-29 DIAGNOSIS — I119 Hypertensive heart disease without heart failure: Secondary | ICD-10-CM | POA: Diagnosis not present

## 2022-07-29 DIAGNOSIS — M47817 Spondylosis without myelopathy or radiculopathy, lumbosacral region: Secondary | ICD-10-CM | POA: Diagnosis not present

## 2022-07-29 DIAGNOSIS — R202 Paresthesia of skin: Secondary | ICD-10-CM | POA: Diagnosis not present

## 2022-07-29 DIAGNOSIS — M1990 Unspecified osteoarthritis, unspecified site: Secondary | ICD-10-CM | POA: Diagnosis not present

## 2022-07-29 DIAGNOSIS — G2401 Drug induced subacute dyskinesia: Secondary | ICD-10-CM | POA: Diagnosis not present

## 2022-07-29 DIAGNOSIS — T887XXD Unspecified adverse effect of drug or medicament, subsequent encounter: Secondary | ICD-10-CM | POA: Diagnosis not present

## 2022-07-29 DIAGNOSIS — R1319 Other dysphagia: Secondary | ICD-10-CM | POA: Diagnosis not present

## 2022-07-29 DIAGNOSIS — R42 Dizziness and giddiness: Secondary | ICD-10-CM | POA: Diagnosis not present

## 2022-07-29 DIAGNOSIS — D649 Anemia, unspecified: Secondary | ICD-10-CM | POA: Diagnosis not present

## 2022-07-29 DIAGNOSIS — M47812 Spondylosis without myelopathy or radiculopathy, cervical region: Secondary | ICD-10-CM | POA: Diagnosis not present

## 2022-07-29 DIAGNOSIS — Z9181 History of falling: Secondary | ICD-10-CM | POA: Diagnosis not present

## 2022-07-29 DIAGNOSIS — Z86711 Personal history of pulmonary embolism: Secondary | ICD-10-CM | POA: Diagnosis not present

## 2022-07-29 DIAGNOSIS — N289 Disorder of kidney and ureter, unspecified: Secondary | ICD-10-CM | POA: Diagnosis not present

## 2022-07-29 DIAGNOSIS — I700 Atherosclerosis of aorta: Secondary | ICD-10-CM | POA: Diagnosis not present

## 2022-07-29 DIAGNOSIS — E785 Hyperlipidemia, unspecified: Secondary | ICD-10-CM | POA: Diagnosis not present

## 2022-07-29 DIAGNOSIS — H9192 Unspecified hearing loss, left ear: Secondary | ICD-10-CM | POA: Diagnosis not present

## 2022-07-30 DIAGNOSIS — M1990 Unspecified osteoarthritis, unspecified site: Secondary | ICD-10-CM | POA: Diagnosis not present

## 2022-07-30 DIAGNOSIS — T887XXD Unspecified adverse effect of drug or medicament, subsequent encounter: Secondary | ICD-10-CM | POA: Diagnosis not present

## 2022-07-30 DIAGNOSIS — R202 Paresthesia of skin: Secondary | ICD-10-CM | POA: Diagnosis not present

## 2022-07-30 DIAGNOSIS — R42 Dizziness and giddiness: Secondary | ICD-10-CM | POA: Diagnosis not present

## 2022-07-30 DIAGNOSIS — I700 Atherosclerosis of aorta: Secondary | ICD-10-CM | POA: Diagnosis not present

## 2022-07-30 DIAGNOSIS — Z9181 History of falling: Secondary | ICD-10-CM | POA: Diagnosis not present

## 2022-07-30 DIAGNOSIS — G2401 Drug induced subacute dyskinesia: Secondary | ICD-10-CM | POA: Diagnosis not present

## 2022-07-30 DIAGNOSIS — K5790 Diverticulosis of intestine, part unspecified, without perforation or abscess without bleeding: Secondary | ICD-10-CM | POA: Diagnosis not present

## 2022-07-30 DIAGNOSIS — R1319 Other dysphagia: Secondary | ICD-10-CM | POA: Diagnosis not present

## 2022-07-30 DIAGNOSIS — H9192 Unspecified hearing loss, left ear: Secondary | ICD-10-CM | POA: Diagnosis not present

## 2022-07-30 DIAGNOSIS — Z86711 Personal history of pulmonary embolism: Secondary | ICD-10-CM | POA: Diagnosis not present

## 2022-07-30 DIAGNOSIS — N289 Disorder of kidney and ureter, unspecified: Secondary | ICD-10-CM | POA: Diagnosis not present

## 2022-07-30 DIAGNOSIS — M47817 Spondylosis without myelopathy or radiculopathy, lumbosacral region: Secondary | ICD-10-CM | POA: Diagnosis not present

## 2022-07-30 DIAGNOSIS — I119 Hypertensive heart disease without heart failure: Secondary | ICD-10-CM | POA: Diagnosis not present

## 2022-07-30 DIAGNOSIS — E785 Hyperlipidemia, unspecified: Secondary | ICD-10-CM | POA: Diagnosis not present

## 2022-07-30 DIAGNOSIS — D649 Anemia, unspecified: Secondary | ICD-10-CM | POA: Diagnosis not present

## 2022-07-30 DIAGNOSIS — M47812 Spondylosis without myelopathy or radiculopathy, cervical region: Secondary | ICD-10-CM | POA: Diagnosis not present

## 2022-08-02 DIAGNOSIS — M47817 Spondylosis without myelopathy or radiculopathy, lumbosacral region: Secondary | ICD-10-CM | POA: Diagnosis not present

## 2022-08-02 DIAGNOSIS — R42 Dizziness and giddiness: Secondary | ICD-10-CM | POA: Diagnosis not present

## 2022-08-02 DIAGNOSIS — Z86711 Personal history of pulmonary embolism: Secondary | ICD-10-CM | POA: Diagnosis not present

## 2022-08-02 DIAGNOSIS — N289 Disorder of kidney and ureter, unspecified: Secondary | ICD-10-CM | POA: Diagnosis not present

## 2022-08-02 DIAGNOSIS — I119 Hypertensive heart disease without heart failure: Secondary | ICD-10-CM | POA: Diagnosis not present

## 2022-08-02 DIAGNOSIS — G2401 Drug induced subacute dyskinesia: Secondary | ICD-10-CM | POA: Diagnosis not present

## 2022-08-02 DIAGNOSIS — E785 Hyperlipidemia, unspecified: Secondary | ICD-10-CM | POA: Diagnosis not present

## 2022-08-02 DIAGNOSIS — D649 Anemia, unspecified: Secondary | ICD-10-CM | POA: Diagnosis not present

## 2022-08-02 DIAGNOSIS — R202 Paresthesia of skin: Secondary | ICD-10-CM | POA: Diagnosis not present

## 2022-08-02 DIAGNOSIS — M47812 Spondylosis without myelopathy or radiculopathy, cervical region: Secondary | ICD-10-CM | POA: Diagnosis not present

## 2022-08-02 DIAGNOSIS — R1319 Other dysphagia: Secondary | ICD-10-CM | POA: Diagnosis not present

## 2022-08-02 DIAGNOSIS — M1990 Unspecified osteoarthritis, unspecified site: Secondary | ICD-10-CM | POA: Diagnosis not present

## 2022-08-02 DIAGNOSIS — K5790 Diverticulosis of intestine, part unspecified, without perforation or abscess without bleeding: Secondary | ICD-10-CM | POA: Diagnosis not present

## 2022-08-02 DIAGNOSIS — I700 Atherosclerosis of aorta: Secondary | ICD-10-CM | POA: Diagnosis not present

## 2022-08-02 DIAGNOSIS — H9192 Unspecified hearing loss, left ear: Secondary | ICD-10-CM | POA: Diagnosis not present

## 2022-08-02 DIAGNOSIS — T887XXD Unspecified adverse effect of drug or medicament, subsequent encounter: Secondary | ICD-10-CM | POA: Diagnosis not present

## 2022-08-02 DIAGNOSIS — Z9181 History of falling: Secondary | ICD-10-CM | POA: Diagnosis not present

## 2022-08-03 ENCOUNTER — Telehealth: Payer: Self-pay

## 2022-08-03 DIAGNOSIS — I119 Hypertensive heart disease without heart failure: Secondary | ICD-10-CM | POA: Diagnosis not present

## 2022-08-03 DIAGNOSIS — M1990 Unspecified osteoarthritis, unspecified site: Secondary | ICD-10-CM | POA: Diagnosis not present

## 2022-08-03 DIAGNOSIS — Z86711 Personal history of pulmonary embolism: Secondary | ICD-10-CM | POA: Diagnosis not present

## 2022-08-03 DIAGNOSIS — R262 Difficulty in walking, not elsewhere classified: Secondary | ICD-10-CM | POA: Diagnosis not present

## 2022-08-03 DIAGNOSIS — R1319 Other dysphagia: Secondary | ICD-10-CM | POA: Diagnosis not present

## 2022-08-03 DIAGNOSIS — R55 Syncope and collapse: Secondary | ICD-10-CM | POA: Diagnosis not present

## 2022-08-03 DIAGNOSIS — K5790 Diverticulosis of intestine, part unspecified, without perforation or abscess without bleeding: Secondary | ICD-10-CM | POA: Diagnosis not present

## 2022-08-03 DIAGNOSIS — Z9181 History of falling: Secondary | ICD-10-CM | POA: Diagnosis not present

## 2022-08-03 DIAGNOSIS — M47817 Spondylosis without myelopathy or radiculopathy, lumbosacral region: Secondary | ICD-10-CM | POA: Diagnosis not present

## 2022-08-03 DIAGNOSIS — H9192 Unspecified hearing loss, left ear: Secondary | ICD-10-CM | POA: Diagnosis not present

## 2022-08-03 DIAGNOSIS — G8191 Hemiplegia, unspecified affecting right dominant side: Secondary | ICD-10-CM | POA: Diagnosis not present

## 2022-08-03 DIAGNOSIS — E785 Hyperlipidemia, unspecified: Secondary | ICD-10-CM | POA: Diagnosis not present

## 2022-08-03 DIAGNOSIS — N289 Disorder of kidney and ureter, unspecified: Secondary | ICD-10-CM | POA: Diagnosis not present

## 2022-08-03 DIAGNOSIS — I700 Atherosclerosis of aorta: Secondary | ICD-10-CM | POA: Diagnosis not present

## 2022-08-03 DIAGNOSIS — G2401 Drug induced subacute dyskinesia: Secondary | ICD-10-CM | POA: Diagnosis not present

## 2022-08-03 DIAGNOSIS — R202 Paresthesia of skin: Secondary | ICD-10-CM | POA: Diagnosis not present

## 2022-08-03 DIAGNOSIS — D649 Anemia, unspecified: Secondary | ICD-10-CM | POA: Diagnosis not present

## 2022-08-03 DIAGNOSIS — T887XXD Unspecified adverse effect of drug or medicament, subsequent encounter: Secondary | ICD-10-CM | POA: Diagnosis not present

## 2022-08-03 DIAGNOSIS — M47812 Spondylosis without myelopathy or radiculopathy, cervical region: Secondary | ICD-10-CM | POA: Diagnosis not present

## 2022-08-03 DIAGNOSIS — R42 Dizziness and giddiness: Secondary | ICD-10-CM | POA: Diagnosis not present

## 2022-08-03 DIAGNOSIS — M6281 Muscle weakness (generalized): Secondary | ICD-10-CM | POA: Diagnosis not present

## 2022-08-03 DIAGNOSIS — M6259 Muscle wasting and atrophy, not elsewhere classified, multiple sites: Secondary | ICD-10-CM | POA: Diagnosis not present

## 2022-08-03 NOTE — Chronic Care Management (AMB) (Signed)
   Judy [name] was reminded to have all medications, supplements and any blood glucose and blood pressure readings available for review with Orlando Penner, Pharm. D, at her telephone visit on at 3:30.  Questions: Have you had any recent office visit or specialist visit outside of [place]? Patient stated no  Are there any concerns you would like to discuss during your office visit? Patient stated no  Are you having any problems obtaining your medications? (Whether it pharmacy issues or cost) Patient stated no  If patient has any PAP medications ask if they are having any problems getting their PAP medication or refill? No PAP meds  Care Gaps: AWV overdue Shingrix overdue Covid booster overdue  Star Rating Drug: Atorvastatin 10 mg- Last filled 07-26-2022 [facility]  Any gaps in medications fill history? No  [place] Pharmacist Assistant [phone]

## 2022-08-04 ENCOUNTER — Telehealth: Payer: Self-pay

## 2022-08-06 ENCOUNTER — Telehealth: Payer: Self-pay

## 2022-08-06 NOTE — Telephone Encounter (Signed)
Opened in error

## 2022-08-06 NOTE — Telephone Encounter (Signed)
  Care Management   Follow Up Note   08/04/2022 Name: Judy [name] MRN: [id number] DOB: [birth year]   Referred by: [name], FNP Reason for referral : No chief complaint on file.   An unsuccessful telephone outreach was attempted today. The patient was referred to the case management team for assistance with care management and care coordination.   Follow Up Plan: The care management team will reach out to the patient again over the next 90 days.   Orlando Penner, CPP, PharmD Clinical Pharmacist Practitioner [facility] [phone]

## 2022-08-06 NOTE — Telephone Encounter (Signed)
  Care Management   Follow Up Note   08/06/2022 Name: Judy [name] MRN: [id number] DOB: 1946/09/18   Referred by: [name], FNP Reason for referral : No chief complaint on file.   Successful contact was made with the patient to discuss care management and care coordination services. Patient declines engagement at this time.  Per patients brother at this time he does not see the need in having involvement in his sisters care since her medications are going to stay the same. I discussed the support and services that are available from the pharmacy team. He declined at this time.   Follow Up Plan: The patient has been provided with contact information for the care management team and has been advised to call with any health related questions or concerns.   Orlando Penner, CPP, PharmD Clinical Pharmacist Practitioner [facility] [phone]

## 2022-08-09 DIAGNOSIS — R202 Paresthesia of skin: Secondary | ICD-10-CM | POA: Diagnosis not present

## 2022-08-09 DIAGNOSIS — K5790 Diverticulosis of intestine, part unspecified, without perforation or abscess without bleeding: Secondary | ICD-10-CM | POA: Diagnosis not present

## 2022-08-09 DIAGNOSIS — R42 Dizziness and giddiness: Secondary | ICD-10-CM | POA: Diagnosis not present

## 2022-08-09 DIAGNOSIS — I119 Hypertensive heart disease without heart failure: Secondary | ICD-10-CM | POA: Diagnosis not present

## 2022-08-09 DIAGNOSIS — I700 Atherosclerosis of aorta: Secondary | ICD-10-CM | POA: Diagnosis not present

## 2022-08-09 DIAGNOSIS — G2401 Drug induced subacute dyskinesia: Secondary | ICD-10-CM | POA: Diagnosis not present

## 2022-08-09 DIAGNOSIS — Z9181 History of falling: Secondary | ICD-10-CM | POA: Diagnosis not present

## 2022-08-09 DIAGNOSIS — H9192 Unspecified hearing loss, left ear: Secondary | ICD-10-CM | POA: Diagnosis not present

## 2022-08-09 DIAGNOSIS — N289 Disorder of kidney and ureter, unspecified: Secondary | ICD-10-CM | POA: Diagnosis not present

## 2022-08-09 DIAGNOSIS — R1319 Other dysphagia: Secondary | ICD-10-CM | POA: Diagnosis not present

## 2022-08-09 DIAGNOSIS — M1990 Unspecified osteoarthritis, unspecified site: Secondary | ICD-10-CM | POA: Diagnosis not present

## 2022-08-09 DIAGNOSIS — E785 Hyperlipidemia, unspecified: Secondary | ICD-10-CM | POA: Diagnosis not present

## 2022-08-09 DIAGNOSIS — Z86711 Personal history of pulmonary embolism: Secondary | ICD-10-CM | POA: Diagnosis not present

## 2022-08-09 DIAGNOSIS — M47817 Spondylosis without myelopathy or radiculopathy, lumbosacral region: Secondary | ICD-10-CM | POA: Diagnosis not present

## 2022-08-09 DIAGNOSIS — D649 Anemia, unspecified: Secondary | ICD-10-CM | POA: Diagnosis not present

## 2022-08-09 DIAGNOSIS — M47812 Spondylosis without myelopathy or radiculopathy, cervical region: Secondary | ICD-10-CM | POA: Diagnosis not present

## 2022-08-09 DIAGNOSIS — T887XXD Unspecified adverse effect of drug or medicament, subsequent encounter: Secondary | ICD-10-CM | POA: Diagnosis not present

## 2022-08-10 ENCOUNTER — Encounter: Payer: Self-pay | Admitting: *Deleted

## 2022-08-10 ENCOUNTER — Ambulatory Visit (INDEPENDENT_AMBULATORY_CARE_PROVIDER_SITE_OTHER): Payer: Medicare Other | Admitting: Nurse Practitioner

## 2022-08-10 VITALS — BP 116/80 | HR 77 | Temp 98.3°F | Ht 69.0 in | Wt 153.0 lb

## 2022-08-10 DIAGNOSIS — R202 Paresthesia of skin: Secondary | ICD-10-CM | POA: Diagnosis not present

## 2022-08-10 DIAGNOSIS — Z1211 Encounter for screening for malignant neoplasm of colon: Secondary | ICD-10-CM

## 2022-08-10 DIAGNOSIS — M47812 Spondylosis without myelopathy or radiculopathy, cervical region: Secondary | ICD-10-CM

## 2022-08-10 DIAGNOSIS — M47817 Spondylosis without myelopathy or radiculopathy, lumbosacral region: Secondary | ICD-10-CM

## 2022-08-10 DIAGNOSIS — M1990 Unspecified osteoarthritis, unspecified site: Secondary | ICD-10-CM

## 2022-08-10 DIAGNOSIS — I5189 Other ill-defined heart diseases: Secondary | ICD-10-CM

## 2022-08-10 DIAGNOSIS — R0602 Shortness of breath: Secondary | ICD-10-CM

## 2022-08-10 DIAGNOSIS — I700 Atherosclerosis of aorta: Secondary | ICD-10-CM

## 2022-08-10 DIAGNOSIS — N950 Postmenopausal bleeding: Secondary | ICD-10-CM

## 2022-08-10 DIAGNOSIS — Z23 Encounter for immunization: Secondary | ICD-10-CM

## 2022-08-10 DIAGNOSIS — R1319 Other dysphagia: Secondary | ICD-10-CM | POA: Diagnosis not present

## 2022-08-10 DIAGNOSIS — H6123 Impacted cerumen, bilateral: Secondary | ICD-10-CM

## 2022-08-10 NOTE — Progress Notes (Signed)
I,Victoria T Hamilton,acting as a Education administrator for [name], FNP.,have documented all relevant documentation on the behalf of [name], FNP,as directed by  [name], FNP while in the presence of [name], [place].    Subjective:     Patient ID: Judy [name] , female    DOB: [birth year] , 76 y.o.   MRN: [id number]   Chief Complaint  Patient presents with   Medication follow up    HPI  Patient presents today for med f/u for Ingrezza which she gets from [place] by her brother. She reports she still works with speech therapy. They came out to her home Monday.  Also, the brother reports an aide came out Monday to assist with her bath Monday as well.  Her brother also wants to know since she will receive Shingrix today , how long should she wait to get the RSV injection.   She is no longer taking the Gabapentin. She is on Remeron for her appetite and insomnia. Her brother reports she is still groggy in the morning. She will go to bed around 1130 after taking her medications at 9am.      Past Medical History:  Diagnosis Date   Allergic rhinitis    Anemia    Anxiety    Arthritis    Bipolar 1 disorder (HCC)    Depression    Diverticulosis of colon (without mention of hemorrhage) 08/25/2011   [name]   Hearing loss in left ear    since birth   Hyperlipidemia    Hypertension    Insomnia    Obesity    Renal disorder    Tardive dyskinesia    Tremor    Vertigo      Family History  Problem Relation Age of Onset   Prostate cancer Brother    Hypertension Brother    Kidney disease Brother    Colon cancer Brother    Hypertension Mother    Kidney disease Mother    Stomach cancer Father    Hyperlipidemia Sister    Hypothyroidism Sister    Stroke Brother    Prostate cancer Brother      Current Outpatient Medications:    acetaminophen (TYLENOL) 325 MG tablet, Take 2 tablets (650 mg total) by mouth every 6 (six) hours as needed for mild pain (or Fever >/=  101)., Disp: , Rfl:    albuterol (VENTOLIN HFA) 108 (90 Base) MCG/ACT inhaler, Inhale 2 puffs into the lungs every 6 (six) hours as needed for wheezing or shortness of breath. Use with spacer, Disp: 8 g, Rfl: 2   atorvastatin (LIPITOR) 10 MG tablet, Take 1 tablet (10 mg total) by mouth daily., Disp: 90 tablet, Rfl: 1   INGREZZA 80 MG capsule, Take 1 capsule (80 mg total) by mouth daily., Disp: 30 capsule, Rfl: 2   meclizine (ANTIVERT) 12.5 MG tablet, Take 1 tablet (12.5 mg total) by mouth 3 (three) times daily as needed for dizziness., Disp: 30 tablet, Rfl: 0   polyethylene glycol (MIRALAX / GLYCOLAX) 17 g packet, Take 17 g by mouth daily., Disp: 30 each, Rfl: 2   b complex vitamins capsule, Take 1 capsule by mouth daily., Disp: , Rfl:    buPROPion (WELLBUTRIN XL) 150 MG 24 hr tablet, Take 1 tablet (150 mg total) by mouth daily., Disp: 90 tablet, Rfl: 0   cholecalciferol (VITAMIN D3) 25 MCG (1000 UNIT) tablet, Take 1,000 Units by mouth daily., Disp: , Rfl:    mirtazapine (REMERON) 7.5 MG  tablet, Take 0.5-1 tablets (3.75-7.5 mg total) by mouth at bedtime as needed., Disp: 90 tablet, Rfl: 0   Multiple Vitamin (MULTIVITAMIN) capsule, Take 1 capsule by mouth daily., Disp: , Rfl:    Omega-3 Fatty Acids (FISH OIL BURP-LESS PO), Take by mouth., Disp: , Rfl:    No Known Allergies   Review of Systems  Constitutional: Negative.   Respiratory: Negative.    Cardiovascular: Negative.   Neurological: Negative.   Psychiatric/Behavioral: Negative.       Today's Vitals   08/10/22 1107  BP: 116/80  Pulse: 77  Temp: 98.3 F (36.8 C)  SpO2: 98%  Weight: 153 lb (69.4 kg)  Height: '5\' 9"'$  (1.753 m)   Body mass index is 22.59 kg/m.  Wt Readings from Last 3 Encounters:  08/11/22 155 lb 6 oz (70.5 kg)  08/10/22 153 lb (69.4 kg)  07/27/22 152 lb 9.6 oz (69.2 kg)    Objective:  Physical Exam Vitals reviewed.  Constitutional:      General: She is not in acute distress.    Appearance: Normal  appearance. She is well-developed.  HENT:     Head: Normocephalic and atraumatic.  Cardiovascular:     Rate and Rhythm: Normal rate and regular rhythm.     Pulses: Normal pulses.     Heart sounds: Normal heart sounds. No murmur heard. Pulmonary:     Effort: Pulmonary effort is normal. No respiratory distress.     Breath sounds: Normal breath sounds. No wheezing.  Musculoskeletal:        General: No swelling. Normal range of motion.     Comments: She is using a rolling, seated rollator  Skin:    General: Skin is warm and dry.     Capillary Refill: Capillary refill takes less than 2 seconds.  Neurological:     General: No focal deficit present.     Mental Status: She is alert and oriented to person, place, and time.     Cranial Nerves: No cranial nerve deficit.     Motor: No weakness.  Psychiatric:        Mood and Affect: Mood normal.        Behavior: Behavior normal.        Thought Content: Thought content normal.        Judgment: Judgment normal.         Assessment And Plan:     1. Other dysphagia Comments: Continue speech therapy  2. Lumbosacral spondylosis without myelopathy Comments: She is to follow up with Neurology  3. Cervical spondylosis without myelopathy  4. Diastolic dysfunction  5. Paresthesia of both hands Comments: She feels like this has improved since being discharged from the hospital however she is to f/u with Neurosurgery. She is no longer taking gabapentin due to SE  6. Aortic atherosclerosis (HCC) Comments: Continue statin, tolerating well.  7. Postmenopausal bleeding Comments: She is to follow up with GYN, referral was made at the last visit.  8. Need for shingles vaccine TransRx done and shingrix #2 given in office - Zoster Recombinant (Shingrix )  9. Encounter for screening colonoscopy According to USPTF Colorectal cancer Screening guidelines. Colonoscopy is recommended every 10 years, starting at age 73 years. Will refer to GI for  colon cancer screening. - Ambulatory referral to Gastroenterology   I personally spent 45 minutes face-to-face and non-face-to-face in the care of this patient, which includes all pre-, intra-, and post visit time on the date of service.   Patient and brother  was given opportunity to ask questions. Patient and brother verbalized understanding of the plan and was able to repeat key elements of the plan. All questions were answered to their satisfaction.  [name], FNP   I, [name], FNP, have reviewed all documentation for this visit. The documentation on 08/10/22 for the exam, diagnosis, procedures, and orders are all accurate and complete  IF YOU HAVE BEEN REFERRED TO A SPECIALIST, IT MAY TAKE 1-2 WEEKS TO SCHEDULE/PROCESS THE REFERRAL. IF YOU HAVE NOT HEARD FROM US/SPECIALIST IN TWO WEEKS, PLEASE GIVE [place] A CALL AT [phone].   THE PATIENT IS ENCOURAGED TO PRACTICE SOCIAL DISTANCING DUE TO THE COVID-19 PANDEMIC.

## 2022-08-10 NOTE — Patient Instructions (Addendum)
Dysphagia  Dysphagia is trouble swallowing. This condition occurs when solids and liquids stick in a person's throat on the way down to the stomach, or when food takes longer to get to the stomach than usual. You may have problems swallowing food, liquids, or both. You may also have pain while trying to swallow. It may take you more time and effort to swallow something. What are the causes? This condition may be caused by: Muscle problems. These may make it difficult for you to move food and liquids through the esophagus, which is the tube that connects your mouth to your stomach. Blockages. You may have ulcers, scar tissue, or inflammation that blocks the normal passage of food and liquids. Causes of these problems include: Acid reflux from your stomach into your esophagus (gastroesophageal reflux). Infections. Radiation treatment for cancer. Medicines taken without enough fluids to wash them down into your stomach. Stroke. This can affect the nerves and make it difficult to swallow. Nerve problems. These prevent signals from being sent to the muscles of your esophagus to squeeze (contract) and move what you swallow down to your stomach. Globus pharyngeus. This is a common problem that involves a feeling like something is stuck in your throat or a sense of trouble with swallowing, even though nothing is wrong with the swallowing passages. Certain conditions, such as cerebral palsy or Parkinson's disease. What are the signs or symptoms? Common symptoms of this condition include: A feeling that solids or liquids are stuck in your throat on the way down to the stomach. Pain while swallowing. Coughing or gagging while trying to swallow. Other symptoms include: Food moving back from your stomach to your mouth (regurgitation). Noises coming from your throat. Chest discomfort when swallowing. A feeling of fullness when swallowing. Drooling, especially when the throat is blocked. Heartburn. How  is this diagnosed? This condition may be diagnosed by: Barium swallow X-ray. In this test, you will swallow a white liquid that sticks to the inside of your esophagus. X-ray images are then taken. Endoscopy. In this test, a flexible telescope is inserted down your throat to look at your esophagus and your stomach. CT scans or an MRI. How is this treated? Treatment for dysphagia depends on the cause of this condition: If the dysphagia is caused by acid reflux or infection, medicines may be used. These may include antibiotics or heartburn medicines. If the dysphagia is caused by problems with the muscles, swallowing therapy may be used to help you strengthen your swallowing muscles. You may have to do specific exercises to strengthen the muscles or stretch them. If the dysphagia is caused by a blockage or mass, procedures to remove the blockage may be done. You may need surgery and a feeding tube. You may need to make diet changes. Ask your health care provider for specific instructions. Follow these instructions at home: Medicines Take over-the-counter and prescription medicines only as told by your health care provider. If you were prescribed an antibiotic medicine, take it as told by your health care provider. Do not stop taking the antibiotic even if you start to feel better. Eating and drinking  Make any diet changes as told by your health care provider. Work with a diet and nutrition specialist (dietitian) to create an eating plan that will help you get the nutrients you need in order to stay healthy. Eat soft foods that are easier to swallow. Cut your food into small pieces and eat slowly. Take small bites. Eat and drink only when you  are sitting upright. Do not drink alcohol or caffeine. If you need help quitting, ask your health care provider. General instructions Check your weight every day to make sure you are not losing weight. Do not use any products that contain nicotine or  tobacco. These products include cigarettes, chewing tobacco, and vaping devices, such as e-cigarettes. If you need help quitting, ask your health care provider. Keep all follow-up visits. This is important. Contact a health care provider if: You lose weight because you cannot swallow. You cough when you drink liquids. You cough up partially digested food. Get help right away if: You cannot swallow your saliva. You have shortness of breath, a fever, or both. Your voice is hoarse and you have trouble swallowing. These symptoms may represent a serious problem that is an emergency. Do not wait to see if the symptoms will go away. Get medical help right away. Call your local emergency services (911 in the [place] Do not drive yourself to the hospital. Summary Dysphagia is trouble swallowing. This condition occurs when solids and liquids stick in a person's throat on the way down to the stomach. You may cough or gag while trying to swallow. Dysphagia has many possible causes. Treatment for dysphagia depends on the cause of the condition. Keep all follow-up visits. This is important. This information is not intended to replace advice given to you by your health care provider. Make sure you discuss any questions you have with your health care provider. Document Revised: 05/17/2020 Document Reviewed: 05/17/2020 Elsevier Patient Education  [place] which [facility]. We can see about getting her arranged with [facility] - (to get [facility] to the [facility] or other doctor appts).   You can take vitamin b for circulation or eat more beets for circulation.   [facility] Call them to schedule appt.  [address], [place], [place] [address] Phone: [phone]

## 2022-08-11 ENCOUNTER — Encounter: Payer: Self-pay | Admitting: *Deleted

## 2022-08-11 ENCOUNTER — Ambulatory Visit: Payer: Medicare Other | Admitting: Diagnostic Neuroimaging

## 2022-08-11 VITALS — BP 139/87 | HR 70 | Ht 69.5 in | Wt 155.4 lb

## 2022-08-11 DIAGNOSIS — M48062 Spinal stenosis, lumbar region with neurogenic claudication: Secondary | ICD-10-CM

## 2022-08-11 DIAGNOSIS — R200 Anesthesia of skin: Secondary | ICD-10-CM

## 2022-08-11 DIAGNOSIS — G2401 Drug induced subacute dyskinesia: Secondary | ICD-10-CM | POA: Diagnosis not present

## 2022-08-11 NOTE — Patient Instructions (Addendum)
TARDIVE DYSKINESIA - continue ingrezza  NUMBNESS IN HANDS (post-traumatic; fall in July 2023; cervical radiculopathies vs ulnar neuropathies) - continue supportive care  SEVERE LUMBAR SPINAL STENOSIS - continue supportive care; consider neurosurgery consult if surgery options would be considered  SOCIAL SUPPORT - currently living alone with support of brother; consider increased supervision / safety due to fall risk

## 2022-08-11 NOTE — Progress Notes (Signed)
[facility]  PATIENT: Judy [name] DOB: Dec 07, 1945  REFERRING CLINICIAN: Addison Lank, PA-C HISTORY FROM: patient  REASON FOR VISIT: new consult    HISTORICAL  CHIEF COMPLAINT:  Chief Complaint  Patient presents with   New Patient (Initial Visit)    Pt reports feeling pretty good. Brother reports pt has twitching with legs, a lot of movement around mouth, pins and  needles in both hands, and have trouble swallowing. Room 7 with brother.    HISTORY OF PRESENT ILLNESS:   76 year old female here for evaluation of numbness in hands, restlessness, tardive dyskinesia, gait difficulty.  Patient has history of tardive dyskinesia, doing well on Ingrezza therapy.  Patient has been on variety of psychiatry medications in the past which is led to these movements.  Patient has chronic low back pain rating to the right leg, gait and balance difficulty, severe spinal stenosis since at least 2018.  She was offered to have lumbar decompression surgery but she opted to follow conservative therapy.  Continuing to have balance and gait difficulty problems.  She lives alone.  She has some support from her brother who sees her few times a week.  She is having more problems with ADLs.  In July 2023 she fell down and subsequently had numbness and pain in her hands since that time.  Has some chronic neck pain.  Has cervical neuroforaminal stenosis on MRI study.   REVIEW OF SYSTEMS: Full 14 system review of systems performed and negative with exception of: as per HPI.  ALLERGIES: No Known Allergies  HOME MEDICATIONS: Outpatient Medications Prior to Visit  Medication Sig Dispense Refill   acetaminophen (TYLENOL) 325 MG tablet Take 2 tablets (650 mg total) by mouth every 6 (six) hours as needed for mild pain (or Fever >/= 101).     albuterol (VENTOLIN HFA) 108 (90 Base) MCG/ACT inhaler Inhale 2 puffs into the lungs every 6 (six) hours as needed for wheezing or shortness of  breath. Use with spacer 8 g 2   atorvastatin (LIPITOR) 10 MG tablet Take 1 tablet (10 mg total) by mouth daily. 90 tablet 1   buPROPion (WELLBUTRIN XL) 150 MG 24 hr tablet Take 1 tablet (150 mg total) by mouth daily. 90 tablet 0   INGREZZA 80 MG capsule Take 1 capsule (80 mg total) by mouth daily. 30 capsule 2   meclizine (ANTIVERT) 12.5 MG tablet Take 1 tablet (12.5 mg total) by mouth 3 (three) times daily as needed for dizziness. 30 tablet 0   mirtazapine (REMERON) 7.5 MG tablet Take 0.5-1 tablets (3.75-7.5 mg total) by mouth at bedtime as needed. 30 tablet 1   polyethylene glycol (MIRALAX / GLYCOLAX) 17 g packet Take 17 g by mouth daily. 30 each 2   No facility-administered medications prior to visit.    PAST MEDICAL HISTORY: Past Medical History:  Diagnosis Date   Allergic rhinitis    Anemia    Anxiety    Arthritis    Bipolar 1 disorder ([place])    Depression    Diverticulosis of colon (without mention of hemorrhage) 08/25/2011   [name]   Hearing loss in left ear    since birth   Hyperlipidemia    Hypertension    Insomnia    Obesity    Renal disorder    Tardive dyskinesia    Tremor    Vertigo     PAST SURGICAL HISTORY: Past Surgical History:  Procedure Laterality Date   COLONOSCOPY  08/25/2011  Procedure: COLONOSCOPY;  Surgeon: Landry Dyke, MD;  Location: [facility];  Service: Endoscopy;  Laterality: N/A;   DILATION AND CURETTAGE OF UTERUS  1960's    FAMILY HISTORY: Family History  Problem Relation Age of Onset   Prostate cancer Brother    Hypertension Brother    Kidney disease Brother    Colon cancer Brother    Hypertension Mother    Kidney disease Mother    Stomach cancer Father    Hyperlipidemia Sister    Hypothyroidism Sister    Stroke Brother    Prostate cancer Brother     SOCIAL HISTORY: Social History   Socioeconomic History   Marital status: Single    Spouse name: Not on file   Number of children: 1   Years of education: 12    Highest education level: Not on file  Occupational History   Occupation: Retired  Tobacco Use   Smoking status: Never   Smokeless tobacco: Never  Scientific laboratory technician Use: Never used  Substance and Sexual Activity   Alcohol use: Never   Drug use: Never   Sexual activity: Not Currently  Other Topics Concern   Not on file  Social History Narrative   Lives alone in house, does not need assist device.  Works part time at [employer]. Has a son.     Cousin and sister in close proximity   Right handed   2 cups coffee per day   Social Determinants of Health   Financial Resource Strain: High Risk (06/22/2022)   Overall Financial Resource Strain (CARDIA)    Difficulty of Paying Living Expenses: Hard  Food Insecurity: No Food Insecurity (07/01/2022)   Hunger Vital Sign    Worried About Running Out of Food in the Last Year: Never true    Ran Out of Food in the Last Year: Never true  Transportation Needs: Unmet Transportation Needs (06/22/2022)   PRAPARE - Transportation    Lack of Transportation (Medical): Yes    Lack of Transportation (Non-Medical): Yes  Physical Activity: Insufficiently Active (12/03/2021)   Exercise Vital Sign    Days of Exercise per Week: 3 days    Minutes of Exercise per Session: 30 min  Stress: No Stress Concern Present (12/03/2021)   [place]    Feeling of Stress : Only a little  Social Connections: Not on file  Intimate Partner Violence: Not At Risk (09/18/2019)   Humiliation, Afraid, Rape, and Kick questionnaire    Fear of Current or Ex-Partner: No    Emotionally Abused: No    Physically Abused: No    Sexually Abused: No     PHYSICAL EXAM  GENERAL EXAM/CONSTITUTIONAL: Vitals:  Vitals:   08/11/22 1126  BP: 139/87  Pulse: 70  Weight: 155 lb 6 oz (70.5 kg)  Height: 5' 9.5" (1.765 m)   Body mass index is 22.62 kg/m. Wt Readings from Last 3 Encounters:  08/11/22 155 lb  6 oz (70.5 kg)  08/10/22 153 lb (69.4 kg)  07/27/22 152 lb 9.6 oz (69.2 kg)   Patient is in no distress; well developed, nourished and groomed; neck is supple  CARDIOVASCULAR: Examination of carotid arteries is normal; no carotid bruits Regular rate and rhythm, no murmurs Examination of peripheral vascular system by observation and palpation is normal  EYES: Ophthalmoscopic exam of optic discs and posterior segments is normal; no papilledema or hemorrhages No results found.  MUSCULOSKELETAL: Gait, strength, tone, movements  noted in Neurologic exam below  NEUROLOGIC: MENTAL STATUS:     07/06/2017    1:30 PM 03/25/2017    3:07 PM 06/04/2014    2:26 PM  MMSE - Mini Mental State Exam  Orientation to time '5 5 5  '$ Orientation to Place '5 5 5  '$ Registration '3 3 3  '$ Attention/ Calculation '4 4 5  '$ Recall '1 2 2  '$ Language- name 2 objects '2 2 2  '$ Language- repeat '1 1 1  '$ Language- follow 3 step command '3 3 3  '$ Language- read & follow direction '1 1 1  '$ Write a sentence '1 1 1  '$ Copy design 1 0 0  Total score '27 27 28   '$ awake, alert, oriented to person, place and time recent and remote memory intact normal attention and concentration language fluent, comprehension intact, naming intact fund of knowledge appropriate  CRANIAL NERVE:  2nd - no papilledema on fundoscopic exam 2nd, 3rd, 4th, 6th - pupils equal and reactive to light, visual fields full to confrontation, extraocular muscles intact, no nystagmus 5th - facial sensation symmetric 7th - facial strength symmetric 8th - hearing intact 9th - palate elevates symmetrically, uvula midline 11th - shoulder shrug symmetric 12th - tongue protrusion midline MILD OROLINGUAL DYSKINESIAS  MOTOR:  SLIGHTLY RESTLESS IN ARMS AND LEGS normal bulk and tone, full strength in the BUE, BLE; EXCEPT FINGER ABDUCTION 4; HIP FLEX 4+; FOOT DF 3-4  SENSORY:  normal and symmetric to light touch, temperature, vibration  COORDINATION:  finger-nose-finger,  fine finger movements normal  REFLEXES:  deep tendon reflexes TRACE and symmetric  GAIT/STATION:  narrow based gait; UNSTEADY; USING WALKER     DIAGNOSTIC DATA (LABS, IMAGING, TESTING) - I reviewed patient records, labs, notes, testing and imaging myself where available.  Lab Results  Component Value Date   WBC 4.9 05/31/2022   HGB 13.4 05/31/2022   HCT 41.6 05/31/2022   MCV 92 05/31/2022   PLT 260 05/31/2022      Component Value Date/Time   NA 144 06/28/2022 1105   K 4.0 06/28/2022 1105   CL 105 06/28/2022 1105   CO2 24 06/28/2022 1105   GLUCOSE 101 (H) 06/28/2022 1105   GLUCOSE 87 04/28/2022 0437   BUN 9 06/28/2022 1105   CREATININE 0.84 06/28/2022 1105   CREATININE 1.16 (H) 03/21/2018 1515   CALCIUM 10.1 06/28/2022 1105   PROT 6.7 05/31/2022 1033   ALBUMIN 4.0 05/31/2022 1033   AST 20 05/31/2022 1033   ALT 15 05/31/2022 1033   ALKPHOS 87 05/31/2022 1033   BILITOT 0.4 05/31/2022 1033   GFRNONAA >60 04/28/2022 0437   GFRNONAA 48 (L) 03/21/2018 1515   GFRAA >60 11/20/2019 0314   GFRAA 55 (L) 03/21/2018 1515   Lab Results  Component Value Date   CHOL 156 03/02/2022   HDL 70 03/02/2022   LDLCALC 71 03/02/2022   TRIG 77 03/02/2022   CHOLHDL 2.2 03/02/2022   Lab Results  Component Value Date   HGBA1C 5.5 03/02/2022   No results found for: "VITAMINB12" Lab Results  Component Value Date   TSH 1.070 06/28/2022    04/23/22 MRI lumbar spine [I reviewed images myself and agree with interpretation. -VRP]  1. Progressive multilevel lumbar spondylosis, as above. 2. Severe canal stenosis with severe bilateral foraminal stenosis at L4-L5. 3. Moderate-severe right and mild left foraminal stenosis at L5-S1.  04/20/22 MRI cervical spine 1. At C4-C5, severe right and mild left foraminal stenosis with mild to moderate canal stenosis. 2. At C5-C6,  moderate right and mild left foraminal stenosis with mild canal stenosis. 3. At C6-C7, mild-to-moderate left greater than  right foraminal stenosis. 4. At C3-C4, mild bilateral foraminal stenosis and mild canal stenosis.  04/20/22 MRI brain  - No acute infarction, hemorrhage, or mass. - Moderate to marked chronic microvascular ischemic changes. - Mild burden of chronic microhemorrhages likely secondary to hypertension.    ASSESSMENT AND PLAN  76 y.o. year old female here with:   Dx:  1. Spinal stenosis of lumbar region with neurogenic claudication   2. Tardive dyskinesia   3. Hand numbness     PLAN:  TARDIVE DYSKINESIA - continue ingrezza  NUMBNESS IN HANDS (likely post-traumatic from fall in July 2023; ddx cervical radiculopathies vs ulnar neuropathies) - continue supportive care; patient not interested in addl testing or surgery options - pain mgmt per PCP or pain mgmt; caution with sedation and fall risk  SEVERE LUMBAR SPINAL STENOSIS - continue supportive care; consider neurosurgery consult if surgery options would be considered by patient  SOCIAL SUPPORT - currently living alone with support of brother; consider increased supervision / safety due to fall risk  Return for return to PCP.  I spent 65 minutes of face-to-face and non-face-to-face time with patient.  This included previsit chart review, lab review, study review, order entry, electronic health record documentation, patient education.     Penni Bombard, MD 89/12/8099, 75:10 PM Certified in Neurology, Neurophysiology and Neuroimaging  [facility] [address] [place], [place] [address] [phone]

## 2022-08-12 DIAGNOSIS — R42 Dizziness and giddiness: Secondary | ICD-10-CM | POA: Diagnosis not present

## 2022-08-12 DIAGNOSIS — M47817 Spondylosis without myelopathy or radiculopathy, lumbosacral region: Secondary | ICD-10-CM | POA: Diagnosis not present

## 2022-08-12 DIAGNOSIS — M1990 Unspecified osteoarthritis, unspecified site: Secondary | ICD-10-CM | POA: Diagnosis not present

## 2022-08-12 DIAGNOSIS — M47812 Spondylosis without myelopathy or radiculopathy, cervical region: Secondary | ICD-10-CM | POA: Diagnosis not present

## 2022-08-12 DIAGNOSIS — D649 Anemia, unspecified: Secondary | ICD-10-CM | POA: Diagnosis not present

## 2022-08-12 DIAGNOSIS — Z9181 History of falling: Secondary | ICD-10-CM | POA: Diagnosis not present

## 2022-08-12 DIAGNOSIS — N289 Disorder of kidney and ureter, unspecified: Secondary | ICD-10-CM | POA: Diagnosis not present

## 2022-08-12 DIAGNOSIS — I700 Atherosclerosis of aorta: Secondary | ICD-10-CM | POA: Diagnosis not present

## 2022-08-12 DIAGNOSIS — I119 Hypertensive heart disease without heart failure: Secondary | ICD-10-CM | POA: Diagnosis not present

## 2022-08-12 DIAGNOSIS — R202 Paresthesia of skin: Secondary | ICD-10-CM | POA: Diagnosis not present

## 2022-08-12 DIAGNOSIS — H9192 Unspecified hearing loss, left ear: Secondary | ICD-10-CM | POA: Diagnosis not present

## 2022-08-12 DIAGNOSIS — T887XXD Unspecified adverse effect of drug or medicament, subsequent encounter: Secondary | ICD-10-CM | POA: Diagnosis not present

## 2022-08-12 DIAGNOSIS — R1319 Other dysphagia: Secondary | ICD-10-CM | POA: Diagnosis not present

## 2022-08-12 DIAGNOSIS — E785 Hyperlipidemia, unspecified: Secondary | ICD-10-CM | POA: Diagnosis not present

## 2022-08-12 DIAGNOSIS — G2401 Drug induced subacute dyskinesia: Secondary | ICD-10-CM | POA: Diagnosis not present

## 2022-08-12 DIAGNOSIS — K5790 Diverticulosis of intestine, part unspecified, without perforation or abscess without bleeding: Secondary | ICD-10-CM | POA: Diagnosis not present

## 2022-08-12 DIAGNOSIS — Z86711 Personal history of pulmonary embolism: Secondary | ICD-10-CM | POA: Diagnosis not present

## 2022-08-15 ENCOUNTER — Other Ambulatory Visit (INDEPENDENT_AMBULATORY_CARE_PROVIDER_SITE_OTHER): Payer: Medicare Other | Admitting: Internal Medicine

## 2022-08-15 DIAGNOSIS — G8191 Hemiplegia, unspecified affecting right dominant side: Secondary | ICD-10-CM | POA: Diagnosis not present

## 2022-08-15 DIAGNOSIS — M47812 Spondylosis without myelopathy or radiculopathy, cervical region: Secondary | ICD-10-CM | POA: Diagnosis not present

## 2022-08-15 DIAGNOSIS — G894 Chronic pain syndrome: Secondary | ICD-10-CM | POA: Diagnosis not present

## 2022-08-15 DIAGNOSIS — M47817 Spondylosis without myelopathy or radiculopathy, lumbosacral region: Secondary | ICD-10-CM | POA: Diagnosis not present

## 2022-08-15 DIAGNOSIS — G2401 Drug induced subacute dyskinesia: Secondary | ICD-10-CM

## 2022-08-15 DIAGNOSIS — F319 Bipolar disorder, unspecified: Secondary | ICD-10-CM

## 2022-08-15 DIAGNOSIS — Z9181 History of falling: Secondary | ICD-10-CM

## 2022-08-15 DIAGNOSIS — T43505A Adverse effect of unspecified antipsychotics and neuroleptics, initial encounter: Secondary | ICD-10-CM | POA: Diagnosis not present

## 2022-08-15 DIAGNOSIS — M1990 Unspecified osteoarthritis, unspecified site: Secondary | ICD-10-CM | POA: Insufficient documentation

## 2022-08-15 DIAGNOSIS — K5790 Diverticulosis of intestine, part unspecified, without perforation or abscess without bleeding: Secondary | ICD-10-CM | POA: Diagnosis not present

## 2022-08-15 NOTE — Progress Notes (Signed)
CC: home health certification  Received home health orders orders from [facility]. Start of care 05/29/22.   Certification and orders from 05/1922 through 07/27/22 are reviewed, signed and faxed back to home health company.  Need of intermittent skilled services at home: SN, PT, OT, MSW, HHA  The home health care plan has been established by me and will be reviewed and updated as needed to maximize patient recovery.  I certify that all home health services have been and will be furnished to the patient while under my care.  Face-to-face encounter in which the need for home health services was established: 04/28/22 hospital discharge;   Patient is receiving home health services for the following diagnoses: Problem List Items Addressed This Visit       Digestive   DD (diverticular disease)     Nervous and Auditory   Right hemiplegia (HCC)     Musculoskeletal and Integument   Cervical spondylosis   Chronic arthritis   Spondylosis of lumbosacral region     Other   Chronic pain - Primary   History of fall   Other Visit Diagnoses     Bipolar affective disorder, remission status unspecified ([place])       Neuroleptic-induced tardive dyskinesia            Maximino Greenland, MD

## 2022-08-16 ENCOUNTER — Ambulatory Visit: Payer: Self-pay

## 2022-08-16 DIAGNOSIS — I119 Hypertensive heart disease without heart failure: Secondary | ICD-10-CM | POA: Diagnosis not present

## 2022-08-16 DIAGNOSIS — Z86711 Personal history of pulmonary embolism: Secondary | ICD-10-CM | POA: Diagnosis not present

## 2022-08-16 DIAGNOSIS — R42 Dizziness and giddiness: Secondary | ICD-10-CM | POA: Diagnosis not present

## 2022-08-16 DIAGNOSIS — M47817 Spondylosis without myelopathy or radiculopathy, lumbosacral region: Secondary | ICD-10-CM | POA: Diagnosis not present

## 2022-08-16 DIAGNOSIS — G2401 Drug induced subacute dyskinesia: Secondary | ICD-10-CM | POA: Diagnosis not present

## 2022-08-16 DIAGNOSIS — E785 Hyperlipidemia, unspecified: Secondary | ICD-10-CM | POA: Diagnosis not present

## 2022-08-16 DIAGNOSIS — H9192 Unspecified hearing loss, left ear: Secondary | ICD-10-CM | POA: Diagnosis not present

## 2022-08-16 DIAGNOSIS — Z9181 History of falling: Secondary | ICD-10-CM | POA: Diagnosis not present

## 2022-08-16 DIAGNOSIS — T887XXD Unspecified adverse effect of drug or medicament, subsequent encounter: Secondary | ICD-10-CM | POA: Diagnosis not present

## 2022-08-16 DIAGNOSIS — K5790 Diverticulosis of intestine, part unspecified, without perforation or abscess without bleeding: Secondary | ICD-10-CM | POA: Diagnosis not present

## 2022-08-16 DIAGNOSIS — D649 Anemia, unspecified: Secondary | ICD-10-CM | POA: Diagnosis not present

## 2022-08-16 DIAGNOSIS — N289 Disorder of kidney and ureter, unspecified: Secondary | ICD-10-CM | POA: Diagnosis not present

## 2022-08-16 DIAGNOSIS — M1990 Unspecified osteoarthritis, unspecified site: Secondary | ICD-10-CM | POA: Diagnosis not present

## 2022-08-16 DIAGNOSIS — R202 Paresthesia of skin: Secondary | ICD-10-CM | POA: Diagnosis not present

## 2022-08-16 DIAGNOSIS — M47812 Spondylosis without myelopathy or radiculopathy, cervical region: Secondary | ICD-10-CM | POA: Diagnosis not present

## 2022-08-16 DIAGNOSIS — I700 Atherosclerosis of aorta: Secondary | ICD-10-CM | POA: Diagnosis not present

## 2022-08-16 DIAGNOSIS — R1319 Other dysphagia: Secondary | ICD-10-CM | POA: Diagnosis not present

## 2022-08-16 NOTE — Patient Outreach (Signed)
  Care Coordination   08/16/2022 Name: Judy [name] MRN: [id number] DOB: 05/26/1946   Care Coordination Outreach Attempts:  An unsuccessful telephone outreach was attempted for a scheduled appointment today.  Follow Up Plan:  Additional outreach attempts will be made to offer the patient care coordination information and services.   Encounter Outcome:  No Answer  Care Coordination Interventions Activated:  No   Care Coordination Interventions:  No, not indicated    Barb Merino, RN, BSN, CCM Care Management Coordinator [facility] Care Management  Direct Phone: [phone]

## 2022-08-17 DIAGNOSIS — M47812 Spondylosis without myelopathy or radiculopathy, cervical region: Secondary | ICD-10-CM | POA: Diagnosis not present

## 2022-08-17 DIAGNOSIS — M1990 Unspecified osteoarthritis, unspecified site: Secondary | ICD-10-CM | POA: Diagnosis not present

## 2022-08-17 DIAGNOSIS — R1319 Other dysphagia: Secondary | ICD-10-CM | POA: Diagnosis not present

## 2022-08-17 DIAGNOSIS — M47817 Spondylosis without myelopathy or radiculopathy, lumbosacral region: Secondary | ICD-10-CM | POA: Diagnosis not present

## 2022-08-17 DIAGNOSIS — R202 Paresthesia of skin: Secondary | ICD-10-CM | POA: Diagnosis not present

## 2022-08-17 DIAGNOSIS — Z9181 History of falling: Secondary | ICD-10-CM | POA: Diagnosis not present

## 2022-08-17 DIAGNOSIS — R42 Dizziness and giddiness: Secondary | ICD-10-CM | POA: Diagnosis not present

## 2022-08-17 DIAGNOSIS — I119 Hypertensive heart disease without heart failure: Secondary | ICD-10-CM | POA: Diagnosis not present

## 2022-08-17 DIAGNOSIS — I700 Atherosclerosis of aorta: Secondary | ICD-10-CM | POA: Diagnosis not present

## 2022-08-17 DIAGNOSIS — G2401 Drug induced subacute dyskinesia: Secondary | ICD-10-CM | POA: Diagnosis not present

## 2022-08-17 DIAGNOSIS — K5790 Diverticulosis of intestine, part unspecified, without perforation or abscess without bleeding: Secondary | ICD-10-CM | POA: Diagnosis not present

## 2022-08-17 DIAGNOSIS — Z86711 Personal history of pulmonary embolism: Secondary | ICD-10-CM | POA: Diagnosis not present

## 2022-08-17 DIAGNOSIS — N289 Disorder of kidney and ureter, unspecified: Secondary | ICD-10-CM | POA: Diagnosis not present

## 2022-08-17 DIAGNOSIS — D649 Anemia, unspecified: Secondary | ICD-10-CM | POA: Diagnosis not present

## 2022-08-17 DIAGNOSIS — T887XXD Unspecified adverse effect of drug or medicament, subsequent encounter: Secondary | ICD-10-CM | POA: Diagnosis not present

## 2022-08-17 DIAGNOSIS — E785 Hyperlipidemia, unspecified: Secondary | ICD-10-CM | POA: Diagnosis not present

## 2022-08-17 DIAGNOSIS — H9192 Unspecified hearing loss, left ear: Secondary | ICD-10-CM | POA: Diagnosis not present

## 2022-08-19 ENCOUNTER — Encounter: Payer: Self-pay | Admitting: Physician Assistant

## 2022-08-19 ENCOUNTER — Ambulatory Visit (INDEPENDENT_AMBULATORY_CARE_PROVIDER_SITE_OTHER): Payer: Medicare Other | Admitting: Physician Assistant

## 2022-08-19 DIAGNOSIS — F411 Generalized anxiety disorder: Secondary | ICD-10-CM | POA: Diagnosis not present

## 2022-08-19 DIAGNOSIS — G2401 Drug induced subacute dyskinesia: Secondary | ICD-10-CM | POA: Diagnosis not present

## 2022-08-19 DIAGNOSIS — F39 Unspecified mood [affective] disorder: Secondary | ICD-10-CM | POA: Diagnosis not present

## 2022-08-19 DIAGNOSIS — M47812 Spondylosis without myelopathy or radiculopathy, cervical region: Secondary | ICD-10-CM | POA: Diagnosis not present

## 2022-08-19 DIAGNOSIS — H9192 Unspecified hearing loss, left ear: Secondary | ICD-10-CM | POA: Diagnosis not present

## 2022-08-19 DIAGNOSIS — F99 Mental disorder, not otherwise specified: Secondary | ICD-10-CM

## 2022-08-19 DIAGNOSIS — M1990 Unspecified osteoarthritis, unspecified site: Secondary | ICD-10-CM | POA: Diagnosis not present

## 2022-08-19 DIAGNOSIS — M47817 Spondylosis without myelopathy or radiculopathy, lumbosacral region: Secondary | ICD-10-CM | POA: Diagnosis not present

## 2022-08-19 DIAGNOSIS — I700 Atherosclerosis of aorta: Secondary | ICD-10-CM | POA: Diagnosis not present

## 2022-08-19 DIAGNOSIS — R202 Paresthesia of skin: Secondary | ICD-10-CM | POA: Diagnosis not present

## 2022-08-19 DIAGNOSIS — N289 Disorder of kidney and ureter, unspecified: Secondary | ICD-10-CM | POA: Diagnosis not present

## 2022-08-19 DIAGNOSIS — Z86711 Personal history of pulmonary embolism: Secondary | ICD-10-CM | POA: Diagnosis not present

## 2022-08-19 DIAGNOSIS — R42 Dizziness and giddiness: Secondary | ICD-10-CM | POA: Diagnosis not present

## 2022-08-19 DIAGNOSIS — K5790 Diverticulosis of intestine, part unspecified, without perforation or abscess without bleeding: Secondary | ICD-10-CM | POA: Diagnosis not present

## 2022-08-19 DIAGNOSIS — E785 Hyperlipidemia, unspecified: Secondary | ICD-10-CM | POA: Diagnosis not present

## 2022-08-19 DIAGNOSIS — T887XXD Unspecified adverse effect of drug or medicament, subsequent encounter: Secondary | ICD-10-CM | POA: Diagnosis not present

## 2022-08-19 DIAGNOSIS — I119 Hypertensive heart disease without heart failure: Secondary | ICD-10-CM | POA: Diagnosis not present

## 2022-08-19 DIAGNOSIS — Z9181 History of falling: Secondary | ICD-10-CM | POA: Diagnosis not present

## 2022-08-19 DIAGNOSIS — D649 Anemia, unspecified: Secondary | ICD-10-CM | POA: Diagnosis not present

## 2022-08-19 DIAGNOSIS — F5105 Insomnia due to other mental disorder: Secondary | ICD-10-CM | POA: Diagnosis not present

## 2022-08-19 DIAGNOSIS — R1319 Other dysphagia: Secondary | ICD-10-CM | POA: Diagnosis not present

## 2022-08-19 MED ORDER — BUPROPION HCL ER (XL) 150 MG PO TB24: 150.0000 mg | ORAL_TABLET | Freq: Every day | ORAL | 0 refills | Status: DC

## 2022-08-19 MED ORDER — MIRTAZAPINE 7.5 MG PO TABS: 3.7500 mg | ORAL_TABLET | Freq: Every evening | ORAL | 0 refills | Status: DC | PRN

## 2022-08-19 NOTE — Progress Notes (Signed)
[facility]  Patient ID: Judy [name],  MRN: [id number]  PCP: [name], FNP  Date of Evaluation: 08/19/2022 Time spent:30 minutes  Chief Complaint:  Chief Complaint   Follow-up    HISTORY/CURRENT STATUS: HPI For routine med check. Brother, [name], and sister-in-law, Blanch Media are with her.  Doing better since she's been taking the Wellbutrin regularly. Also walking better, using a Rolader now, and physically stronger. Since starting the Mirtazepine, has been sleeping much better and feels rested when she gets up. Doesn't feel groggy during the day either. Being off the Gabapentin has helped. Brother states she was on too high of a dose. "Everybody said they'd never heard of anybody on that dose. It was too much for her." Pt hasn't noticed an increase in anxiety since going off it. [name] and Blanch Media think the Gabapentin was causing SOB and now it's resolved.  Mood is good most of the time.  No extreme sadness, tearfulness, or feelings of hopelessness.  Is able to do the ADLs absolutely needed. Personal hygiene is normal.   Denies any changes in concentration, making decisions, or remembering things.  Eats well.  Weight is stable.  Denies suicidal or homicidal thoughts.  Patient denies increased energy with decreased need for sleep, increased talkativeness, racing thoughts, impulsivity or risky behaviors, increased spending, increased libido, grandiosity, increased irritability or anger, paranoia, or hallucinations.  Review of Systems  Musculoskeletal:        Using Rolader.   Neurological:        No tremor or signs of tardive dyskinesia. Has unchanged numbness in hands bilat   Psychiatric/Behavioral:         See HPI  All other systems reviewed and are negative.  Individual Medical History/ Review of Systems: Changes? :Yes     Reviewed note 07/27/2022, Cardiology, deconditioning was mentioned could be contributing to the SOB. Brother and SIL imply it was d/t  gabapentin. Reviewed note 08/10/2022, PCP. Getting [place], [place] Reviewed note 08/11/2022, Neuro, advised continue Ingrezza  Past medications for mental health diagnoses include: Latuda, Seroquel, Prozac, Fetzima, Risperdal, gabapentin, Wellbutrin  Allergies: Patient has no known allergies.  Current Medications:  Current Outpatient Medications:    acetaminophen (TYLENOL) 325 MG tablet, Take 2 tablets (650 mg total) by mouth every 6 (six) hours as needed for mild pain (or Fever >/= 101)., Disp: , Rfl:    albuterol (VENTOLIN HFA) 108 (90 Base) MCG/ACT inhaler, Inhale 2 puffs into the lungs every 6 (six) hours as needed for wheezing or shortness of breath. Use with spacer, Disp: 8 g, Rfl: 2   atorvastatin (LIPITOR) 10 MG tablet, Take 1 tablet (10 mg total) by mouth daily., Disp: 90 tablet, Rfl: 1   b complex vitamins capsule, Take 1 capsule by mouth daily., Disp: , Rfl:    cholecalciferol (VITAMIN D3) 25 MCG (1000 UNIT) tablet, Take 1,000 Units by mouth daily., Disp: , Rfl:    INGREZZA 80 MG capsule, Take 1 capsule (80 mg total) by mouth daily., Disp: 30 capsule, Rfl: 2   meclizine (ANTIVERT) 12.5 MG tablet, Take 1 tablet (12.5 mg total) by mouth 3 (three) times daily as needed for dizziness., Disp: 30 tablet, Rfl: 0   Multiple Vitamin (MULTIVITAMIN) capsule, Take 1 capsule by mouth daily., Disp: , Rfl:    Omega-3 Fatty Acids (FISH OIL BURP-LESS PO), Take by mouth., Disp: , Rfl:    polyethylene glycol (MIRALAX / GLYCOLAX) 17 g packet, Take 17 g by mouth daily., Disp: 30 each, Rfl: 2  buPROPion (WELLBUTRIN XL) 150 MG 24 hr tablet, Take 1 tablet (150 mg total) by mouth daily., Disp: 90 tablet, Rfl: 0   mirtazapine (REMERON) 7.5 MG tablet, Take 0.5-1 tablets (3.75-7.5 mg total) by mouth at bedtime as needed., Disp: 90 tablet, Rfl: 0 Medication Side Effects:  see HPI  Family Medical/ Social History: Changes? no  MENTAL HEALTH EXAM:  There were no vitals taken for this visit.There is no height or  weight on file to calculate BMI.  General Appearance: Casual, Neat and Well Groomed  Eye Contact:  Good  Speech:  Clear and Coherent and Normal Rate  Volume:   speaks softly and is hard of hearing  Mood:  Euthymic  Affect:  Appropriate  Thought Process:  Goal Directed and Descriptions of Associations: Circumstantial  Orientation:  Full (Time, Place, and Person)  Thought Content: Logical   Suicidal Thoughts:  No  Homicidal Thoughts:  No  Memory:   fair  Judgement:  Fair  Insight:  Fair  Psychomotor Activity:   Walks slowly w/ rolader, No abnl mouth movements, no truncal movements, clearing of throat or cough  Concentration:  Concentration: Good  Recall:  [place] of Knowledge: Good  Language: Good  Assets:  Desire for Improvement  ADL's:  Impaired  Cognition: WNL  Prognosis:  Fair   DIAGNOSES:    ICD-10-CM   1. Episodic mood disorder ([place])  F39     2. Generalized anxiety disorder  F41.1     3. Insomnia due to other mental disorder  F51.05    F99     4. Tardive dyskinesia  G24.01      Receiving Psychotherapy: No   RECOMMENDATIONS:  PDMP was reviewed.  Gabapentin filled 06/09/2022. I provided 30 minutes of face to face time during this encounter, including time spent before and after the visit in records review, medical decision making, counseling pertinent to today's visit, and charting.   She is responding to the Wellbutrin and doesn't feel the need, or want, to take a mood stabilizer or AP at this time. She and fm, understandably, want her on as little medicine as possible, and I agree.  Insomnia is much better since going on Mirtazapine so no change there.  Ingrezza has helped the TD so will continue.  Appreciate Dr. Gladstone Lighter opinion on that and for treatment of neuropathy, which is supportive care and pain management by PCP or pain specialist. Of course caution w/ sedating meds w/ increased fall risk.   Continue Wellbutrin XL 150 mg, 1 q am.  Continue Ingrezza 80  mg, 1 p.o. daily. Continue mirtazapine 7.5 mg, 1/2-1 p.o. nightly as needed sleep. Continue multivitamin, B complex, vitamin D, fish oil. Return in 6 months. Stressed compliance with appointments which has been a problem in the past.  Donnal Moat, PA-C

## 2022-08-22 ENCOUNTER — Encounter: Payer: Self-pay | Admitting: Physician Assistant

## 2022-08-23 DIAGNOSIS — M47812 Spondylosis without myelopathy or radiculopathy, cervical region: Secondary | ICD-10-CM | POA: Diagnosis not present

## 2022-08-23 DIAGNOSIS — R202 Paresthesia of skin: Secondary | ICD-10-CM | POA: Diagnosis not present

## 2022-08-23 DIAGNOSIS — T887XXD Unspecified adverse effect of drug or medicament, subsequent encounter: Secondary | ICD-10-CM | POA: Diagnosis not present

## 2022-08-23 DIAGNOSIS — M47817 Spondylosis without myelopathy or radiculopathy, lumbosacral region: Secondary | ICD-10-CM | POA: Diagnosis not present

## 2022-08-23 DIAGNOSIS — M1990 Unspecified osteoarthritis, unspecified site: Secondary | ICD-10-CM | POA: Diagnosis not present

## 2022-08-23 DIAGNOSIS — Z86711 Personal history of pulmonary embolism: Secondary | ICD-10-CM | POA: Diagnosis not present

## 2022-08-23 DIAGNOSIS — R1319 Other dysphagia: Secondary | ICD-10-CM | POA: Diagnosis not present

## 2022-08-23 DIAGNOSIS — I119 Hypertensive heart disease without heart failure: Secondary | ICD-10-CM | POA: Diagnosis not present

## 2022-08-23 DIAGNOSIS — N289 Disorder of kidney and ureter, unspecified: Secondary | ICD-10-CM | POA: Diagnosis not present

## 2022-08-23 DIAGNOSIS — I700 Atherosclerosis of aorta: Secondary | ICD-10-CM | POA: Diagnosis not present

## 2022-08-23 DIAGNOSIS — E785 Hyperlipidemia, unspecified: Secondary | ICD-10-CM | POA: Diagnosis not present

## 2022-08-23 DIAGNOSIS — H9192 Unspecified hearing loss, left ear: Secondary | ICD-10-CM | POA: Diagnosis not present

## 2022-08-23 DIAGNOSIS — Z9181 History of falling: Secondary | ICD-10-CM | POA: Diagnosis not present

## 2022-08-23 DIAGNOSIS — G2401 Drug induced subacute dyskinesia: Secondary | ICD-10-CM | POA: Diagnosis not present

## 2022-08-23 DIAGNOSIS — D649 Anemia, unspecified: Secondary | ICD-10-CM | POA: Diagnosis not present

## 2022-08-23 DIAGNOSIS — R42 Dizziness and giddiness: Secondary | ICD-10-CM | POA: Diagnosis not present

## 2022-08-23 DIAGNOSIS — K5790 Diverticulosis of intestine, part unspecified, without perforation or abscess without bleeding: Secondary | ICD-10-CM | POA: Diagnosis not present

## 2022-08-24 ENCOUNTER — Ambulatory Visit: Payer: Medicare Other | Admitting: Internal Medicine

## 2022-08-24 DIAGNOSIS — Z9181 History of falling: Secondary | ICD-10-CM | POA: Diagnosis not present

## 2022-08-24 DIAGNOSIS — K5790 Diverticulosis of intestine, part unspecified, without perforation or abscess without bleeding: Secondary | ICD-10-CM | POA: Diagnosis not present

## 2022-08-24 DIAGNOSIS — Z86711 Personal history of pulmonary embolism: Secondary | ICD-10-CM | POA: Diagnosis not present

## 2022-08-24 DIAGNOSIS — R202 Paresthesia of skin: Secondary | ICD-10-CM | POA: Diagnosis not present

## 2022-08-24 DIAGNOSIS — D649 Anemia, unspecified: Secondary | ICD-10-CM | POA: Diagnosis not present

## 2022-08-24 DIAGNOSIS — I700 Atherosclerosis of aorta: Secondary | ICD-10-CM | POA: Diagnosis not present

## 2022-08-24 DIAGNOSIS — G2401 Drug induced subacute dyskinesia: Secondary | ICD-10-CM | POA: Diagnosis not present

## 2022-08-24 DIAGNOSIS — H9192 Unspecified hearing loss, left ear: Secondary | ICD-10-CM | POA: Diagnosis not present

## 2022-08-24 DIAGNOSIS — R1319 Other dysphagia: Secondary | ICD-10-CM | POA: Diagnosis not present

## 2022-08-24 DIAGNOSIS — M47817 Spondylosis without myelopathy or radiculopathy, lumbosacral region: Secondary | ICD-10-CM | POA: Diagnosis not present

## 2022-08-24 DIAGNOSIS — M47812 Spondylosis without myelopathy or radiculopathy, cervical region: Secondary | ICD-10-CM | POA: Diagnosis not present

## 2022-08-24 DIAGNOSIS — I119 Hypertensive heart disease without heart failure: Secondary | ICD-10-CM | POA: Diagnosis not present

## 2022-08-24 DIAGNOSIS — E785 Hyperlipidemia, unspecified: Secondary | ICD-10-CM | POA: Diagnosis not present

## 2022-08-24 DIAGNOSIS — N289 Disorder of kidney and ureter, unspecified: Secondary | ICD-10-CM | POA: Diagnosis not present

## 2022-08-24 DIAGNOSIS — T887XXD Unspecified adverse effect of drug or medicament, subsequent encounter: Secondary | ICD-10-CM | POA: Diagnosis not present

## 2022-08-24 DIAGNOSIS — R42 Dizziness and giddiness: Secondary | ICD-10-CM | POA: Diagnosis not present

## 2022-08-24 DIAGNOSIS — M1990 Unspecified osteoarthritis, unspecified site: Secondary | ICD-10-CM | POA: Diagnosis not present

## 2022-08-26 ENCOUNTER — Other Ambulatory Visit: Payer: Self-pay

## 2022-08-26 DIAGNOSIS — M47812 Spondylosis without myelopathy or radiculopathy, cervical region: Secondary | ICD-10-CM | POA: Diagnosis not present

## 2022-08-26 DIAGNOSIS — D649 Anemia, unspecified: Secondary | ICD-10-CM | POA: Diagnosis not present

## 2022-08-26 DIAGNOSIS — Z9181 History of falling: Secondary | ICD-10-CM | POA: Diagnosis not present

## 2022-08-26 DIAGNOSIS — E785 Hyperlipidemia, unspecified: Secondary | ICD-10-CM | POA: Diagnosis not present

## 2022-08-26 DIAGNOSIS — R1319 Other dysphagia: Secondary | ICD-10-CM | POA: Diagnosis not present

## 2022-08-26 DIAGNOSIS — Z86711 Personal history of pulmonary embolism: Secondary | ICD-10-CM | POA: Diagnosis not present

## 2022-08-26 DIAGNOSIS — H9192 Unspecified hearing loss, left ear: Secondary | ICD-10-CM | POA: Diagnosis not present

## 2022-08-26 DIAGNOSIS — R42 Dizziness and giddiness: Secondary | ICD-10-CM | POA: Diagnosis not present

## 2022-08-26 DIAGNOSIS — I700 Atherosclerosis of aorta: Secondary | ICD-10-CM | POA: Diagnosis not present

## 2022-08-26 DIAGNOSIS — G2401 Drug induced subacute dyskinesia: Secondary | ICD-10-CM | POA: Diagnosis not present

## 2022-08-26 DIAGNOSIS — N289 Disorder of kidney and ureter, unspecified: Secondary | ICD-10-CM | POA: Diagnosis not present

## 2022-08-26 DIAGNOSIS — M47817 Spondylosis without myelopathy or radiculopathy, lumbosacral region: Secondary | ICD-10-CM | POA: Diagnosis not present

## 2022-08-26 DIAGNOSIS — T887XXD Unspecified adverse effect of drug or medicament, subsequent encounter: Secondary | ICD-10-CM | POA: Diagnosis not present

## 2022-08-26 DIAGNOSIS — K5790 Diverticulosis of intestine, part unspecified, without perforation or abscess without bleeding: Secondary | ICD-10-CM | POA: Diagnosis not present

## 2022-08-26 DIAGNOSIS — R202 Paresthesia of skin: Secondary | ICD-10-CM | POA: Diagnosis not present

## 2022-08-26 DIAGNOSIS — I119 Hypertensive heart disease without heart failure: Secondary | ICD-10-CM | POA: Diagnosis not present

## 2022-08-26 DIAGNOSIS — M1990 Unspecified osteoarthritis, unspecified site: Secondary | ICD-10-CM | POA: Diagnosis not present

## 2022-08-26 MED ORDER — INGREZZA 80 MG PO CAPS: 80.0000 mg | ORAL_CAPSULE | Freq: Every day | ORAL | 2 refills | Status: DC

## 2022-08-30 ENCOUNTER — Other Ambulatory Visit (INDEPENDENT_AMBULATORY_CARE_PROVIDER_SITE_OTHER): Payer: Medicare Other | Admitting: Internal Medicine

## 2022-08-30 DIAGNOSIS — D649 Anemia, unspecified: Secondary | ICD-10-CM | POA: Diagnosis not present

## 2022-08-30 DIAGNOSIS — E7800 Pure hypercholesterolemia, unspecified: Secondary | ICD-10-CM

## 2022-08-30 DIAGNOSIS — H9192 Unspecified hearing loss, left ear: Secondary | ICD-10-CM

## 2022-08-30 DIAGNOSIS — N289 Disorder of kidney and ureter, unspecified: Secondary | ICD-10-CM | POA: Diagnosis not present

## 2022-08-30 DIAGNOSIS — R42 Dizziness and giddiness: Secondary | ICD-10-CM | POA: Diagnosis not present

## 2022-08-30 DIAGNOSIS — G2401 Drug induced subacute dyskinesia: Secondary | ICD-10-CM

## 2022-08-30 DIAGNOSIS — F319 Bipolar disorder, unspecified: Secondary | ICD-10-CM | POA: Diagnosis not present

## 2022-08-30 DIAGNOSIS — Z9181 History of falling: Secondary | ICD-10-CM

## 2022-08-30 DIAGNOSIS — G8191 Hemiplegia, unspecified affecting right dominant side: Secondary | ICD-10-CM

## 2022-08-30 DIAGNOSIS — Z86711 Personal history of pulmonary embolism: Secondary | ICD-10-CM | POA: Diagnosis not present

## 2022-08-30 DIAGNOSIS — I700 Atherosclerosis of aorta: Secondary | ICD-10-CM

## 2022-08-30 DIAGNOSIS — M47817 Spondylosis without myelopathy or radiculopathy, lumbosacral region: Secondary | ICD-10-CM

## 2022-08-30 DIAGNOSIS — T887XXD Unspecified adverse effect of drug or medicament, subsequent encounter: Secondary | ICD-10-CM | POA: Diagnosis not present

## 2022-08-30 DIAGNOSIS — R1319 Other dysphagia: Secondary | ICD-10-CM | POA: Diagnosis not present

## 2022-08-30 DIAGNOSIS — E785 Hyperlipidemia, unspecified: Secondary | ICD-10-CM | POA: Diagnosis not present

## 2022-08-30 DIAGNOSIS — R202 Paresthesia of skin: Secondary | ICD-10-CM | POA: Diagnosis not present

## 2022-08-30 DIAGNOSIS — K5790 Diverticulosis of intestine, part unspecified, without perforation or abscess without bleeding: Secondary | ICD-10-CM | POA: Diagnosis not present

## 2022-08-30 DIAGNOSIS — M47812 Spondylosis without myelopathy or radiculopathy, cervical region: Secondary | ICD-10-CM | POA: Diagnosis not present

## 2022-08-30 DIAGNOSIS — M1990 Unspecified osteoarthritis, unspecified site: Secondary | ICD-10-CM | POA: Diagnosis not present

## 2022-08-30 DIAGNOSIS — I119 Hypertensive heart disease without heart failure: Secondary | ICD-10-CM | POA: Diagnosis not present

## 2022-08-30 NOTE — Progress Notes (Signed)
Cc: Home health certification  Received home health orders orders from [facility]. Start of care 07/16/22.   Certification and orders from 07/16/22 through 09/13/22 are reviewed, signed and faxed back to home health company.  Need of intermittent skilled services at home: PT/ST/HHA  The home health care plan has been established by me and will be reviewed and updated as needed to maximize patient recovery.  I certify that all home health services have been and will be furnished to the patient while under my care.  Face-to-face encounter in which the need for home health services was established: 06/28/22  Patient is receiving home health services for the following diagnoses: Problem List Items Addressed This Visit       Digestive   Other dysphagia - Primary     Nervous and Auditory   Hearing loss in left ear   Tardive dyskinesia   Right hemiplegia ([place])     Other   HYPERCHOLESTEROLEMIA   History of fall   Other Visit Diagnoses     Lumbosacral spondylosis without myelopathy       Aortic atherosclerosis ([place])       Bipolar affective disorder, remission status unspecified (HCC)            Maximino Greenland, MD

## 2022-09-03 DIAGNOSIS — M6259 Muscle wasting and atrophy, not elsewhere classified, multiple sites: Secondary | ICD-10-CM | POA: Diagnosis not present

## 2022-09-03 DIAGNOSIS — R55 Syncope and collapse: Secondary | ICD-10-CM | POA: Diagnosis not present

## 2022-09-03 DIAGNOSIS — R262 Difficulty in walking, not elsewhere classified: Secondary | ICD-10-CM | POA: Diagnosis not present

## 2022-09-03 DIAGNOSIS — G8191 Hemiplegia, unspecified affecting right dominant side: Secondary | ICD-10-CM | POA: Diagnosis not present

## 2022-09-03 DIAGNOSIS — M6281 Muscle weakness (generalized): Secondary | ICD-10-CM | POA: Diagnosis not present

## 2022-09-06 DIAGNOSIS — I700 Atherosclerosis of aorta: Secondary | ICD-10-CM | POA: Diagnosis not present

## 2022-09-06 DIAGNOSIS — T887XXD Unspecified adverse effect of drug or medicament, subsequent encounter: Secondary | ICD-10-CM | POA: Diagnosis not present

## 2022-09-06 DIAGNOSIS — R42 Dizziness and giddiness: Secondary | ICD-10-CM | POA: Diagnosis not present

## 2022-09-06 DIAGNOSIS — M1990 Unspecified osteoarthritis, unspecified site: Secondary | ICD-10-CM | POA: Diagnosis not present

## 2022-09-06 DIAGNOSIS — Z86711 Personal history of pulmonary embolism: Secondary | ICD-10-CM | POA: Diagnosis not present

## 2022-09-06 DIAGNOSIS — D649 Anemia, unspecified: Secondary | ICD-10-CM | POA: Diagnosis not present

## 2022-09-06 DIAGNOSIS — N289 Disorder of kidney and ureter, unspecified: Secondary | ICD-10-CM | POA: Diagnosis not present

## 2022-09-06 DIAGNOSIS — K5790 Diverticulosis of intestine, part unspecified, without perforation or abscess without bleeding: Secondary | ICD-10-CM | POA: Diagnosis not present

## 2022-09-06 DIAGNOSIS — M47812 Spondylosis without myelopathy or radiculopathy, cervical region: Secondary | ICD-10-CM | POA: Diagnosis not present

## 2022-09-06 DIAGNOSIS — G2401 Drug induced subacute dyskinesia: Secondary | ICD-10-CM | POA: Diagnosis not present

## 2022-09-06 DIAGNOSIS — R202 Paresthesia of skin: Secondary | ICD-10-CM | POA: Diagnosis not present

## 2022-09-06 DIAGNOSIS — I119 Hypertensive heart disease without heart failure: Secondary | ICD-10-CM | POA: Diagnosis not present

## 2022-09-06 DIAGNOSIS — E785 Hyperlipidemia, unspecified: Secondary | ICD-10-CM | POA: Diagnosis not present

## 2022-09-06 DIAGNOSIS — Z9181 History of falling: Secondary | ICD-10-CM | POA: Diagnosis not present

## 2022-09-06 DIAGNOSIS — R1319 Other dysphagia: Secondary | ICD-10-CM | POA: Diagnosis not present

## 2022-09-06 DIAGNOSIS — H9192 Unspecified hearing loss, left ear: Secondary | ICD-10-CM | POA: Diagnosis not present

## 2022-09-06 DIAGNOSIS — M47817 Spondylosis without myelopathy or radiculopathy, lumbosacral region: Secondary | ICD-10-CM | POA: Diagnosis not present

## 2022-09-07 DIAGNOSIS — M47812 Spondylosis without myelopathy or radiculopathy, cervical region: Secondary | ICD-10-CM | POA: Diagnosis not present

## 2022-09-07 DIAGNOSIS — G2401 Drug induced subacute dyskinesia: Secondary | ICD-10-CM | POA: Diagnosis not present

## 2022-09-07 DIAGNOSIS — T887XXD Unspecified adverse effect of drug or medicament, subsequent encounter: Secondary | ICD-10-CM | POA: Diagnosis not present

## 2022-09-07 DIAGNOSIS — N289 Disorder of kidney and ureter, unspecified: Secondary | ICD-10-CM | POA: Diagnosis not present

## 2022-09-07 DIAGNOSIS — H9192 Unspecified hearing loss, left ear: Secondary | ICD-10-CM | POA: Diagnosis not present

## 2022-09-07 DIAGNOSIS — E785 Hyperlipidemia, unspecified: Secondary | ICD-10-CM | POA: Diagnosis not present

## 2022-09-07 DIAGNOSIS — Z9181 History of falling: Secondary | ICD-10-CM | POA: Diagnosis not present

## 2022-09-07 DIAGNOSIS — I119 Hypertensive heart disease without heart failure: Secondary | ICD-10-CM | POA: Diagnosis not present

## 2022-09-07 DIAGNOSIS — K5790 Diverticulosis of intestine, part unspecified, without perforation or abscess without bleeding: Secondary | ICD-10-CM | POA: Diagnosis not present

## 2022-09-07 DIAGNOSIS — Z86711 Personal history of pulmonary embolism: Secondary | ICD-10-CM | POA: Diagnosis not present

## 2022-09-07 DIAGNOSIS — R1319 Other dysphagia: Secondary | ICD-10-CM | POA: Diagnosis not present

## 2022-09-07 DIAGNOSIS — D649 Anemia, unspecified: Secondary | ICD-10-CM | POA: Diagnosis not present

## 2022-09-07 DIAGNOSIS — M47817 Spondylosis without myelopathy or radiculopathy, lumbosacral region: Secondary | ICD-10-CM | POA: Diagnosis not present

## 2022-09-07 DIAGNOSIS — I700 Atherosclerosis of aorta: Secondary | ICD-10-CM | POA: Diagnosis not present

## 2022-09-07 DIAGNOSIS — R202 Paresthesia of skin: Secondary | ICD-10-CM | POA: Diagnosis not present

## 2022-09-07 DIAGNOSIS — R42 Dizziness and giddiness: Secondary | ICD-10-CM | POA: Diagnosis not present

## 2022-09-07 DIAGNOSIS — M1990 Unspecified osteoarthritis, unspecified site: Secondary | ICD-10-CM | POA: Diagnosis not present

## 2022-09-13 DIAGNOSIS — I119 Hypertensive heart disease without heart failure: Secondary | ICD-10-CM | POA: Diagnosis not present

## 2022-09-13 DIAGNOSIS — D649 Anemia, unspecified: Secondary | ICD-10-CM | POA: Diagnosis not present

## 2022-09-13 DIAGNOSIS — M47812 Spondylosis without myelopathy or radiculopathy, cervical region: Secondary | ICD-10-CM | POA: Diagnosis not present

## 2022-09-13 DIAGNOSIS — N289 Disorder of kidney and ureter, unspecified: Secondary | ICD-10-CM | POA: Diagnosis not present

## 2022-09-13 DIAGNOSIS — Z86711 Personal history of pulmonary embolism: Secondary | ICD-10-CM | POA: Diagnosis not present

## 2022-09-13 DIAGNOSIS — E785 Hyperlipidemia, unspecified: Secondary | ICD-10-CM | POA: Diagnosis not present

## 2022-09-13 DIAGNOSIS — Z9181 History of falling: Secondary | ICD-10-CM | POA: Diagnosis not present

## 2022-09-13 DIAGNOSIS — G2401 Drug induced subacute dyskinesia: Secondary | ICD-10-CM | POA: Diagnosis not present

## 2022-09-13 DIAGNOSIS — M47817 Spondylosis without myelopathy or radiculopathy, lumbosacral region: Secondary | ICD-10-CM | POA: Diagnosis not present

## 2022-09-13 DIAGNOSIS — I700 Atherosclerosis of aorta: Secondary | ICD-10-CM | POA: Diagnosis not present

## 2022-09-13 DIAGNOSIS — M1990 Unspecified osteoarthritis, unspecified site: Secondary | ICD-10-CM | POA: Diagnosis not present

## 2022-09-13 DIAGNOSIS — H9192 Unspecified hearing loss, left ear: Secondary | ICD-10-CM | POA: Diagnosis not present

## 2022-09-13 DIAGNOSIS — R202 Paresthesia of skin: Secondary | ICD-10-CM | POA: Diagnosis not present

## 2022-09-13 DIAGNOSIS — K5790 Diverticulosis of intestine, part unspecified, without perforation or abscess without bleeding: Secondary | ICD-10-CM | POA: Diagnosis not present

## 2022-09-13 DIAGNOSIS — R42 Dizziness and giddiness: Secondary | ICD-10-CM | POA: Diagnosis not present

## 2022-09-13 DIAGNOSIS — T887XXD Unspecified adverse effect of drug or medicament, subsequent encounter: Secondary | ICD-10-CM | POA: Diagnosis not present

## 2022-09-13 DIAGNOSIS — R1319 Other dysphagia: Secondary | ICD-10-CM | POA: Diagnosis not present

## 2022-09-14 ENCOUNTER — Telehealth: Payer: Self-pay | Admitting: *Deleted

## 2022-09-14 NOTE — Progress Notes (Signed)
  Care Coordination Note  09/14/2022 Name: Judy [name] MRN: [id number] DOB: [birth year]  Judy [name] is a 76 y.o. year old female who is a primary care patient of [name], [place] and is actively engaged with the care management team. I reached out to [name] by phone today to assist with re-scheduling a follow up visit with the RN Case Manager  Follow up plan: Unsuccessful telephone outreach attempt made. A HIPAA compliant phone message was left for the patient providing contact information and requesting a return call.   [place]  Direct Dial: [phone]

## 2022-10-03 DIAGNOSIS — R262 Difficulty in walking, not elsewhere classified: Secondary | ICD-10-CM | POA: Diagnosis not present

## 2022-10-03 DIAGNOSIS — G8191 Hemiplegia, unspecified affecting right dominant side: Secondary | ICD-10-CM | POA: Diagnosis not present

## 2022-10-03 DIAGNOSIS — M6281 Muscle weakness (generalized): Secondary | ICD-10-CM | POA: Diagnosis not present

## 2022-10-03 DIAGNOSIS — M6259 Muscle wasting and atrophy, not elsewhere classified, multiple sites: Secondary | ICD-10-CM | POA: Diagnosis not present

## 2022-10-03 DIAGNOSIS — R55 Syncope and collapse: Secondary | ICD-10-CM | POA: Diagnosis not present

## 2022-10-12 ENCOUNTER — Telehealth: Payer: Self-pay

## 2022-10-13 NOTE — Telephone Encounter (Signed)
Pt's prior approval for Ingrezza 80 mg is already on file effective 10/11/2022-10/11/2023 with [employer] Part D

## 2022-10-25 ENCOUNTER — Other Ambulatory Visit: Payer: Self-pay

## 2022-10-25 MED ORDER — INGREZZA 80 MG PO CAPS: 80.0000 mg | ORAL_CAPSULE | Freq: Every day | ORAL | 2 refills | Status: DC

## 2022-10-27 ENCOUNTER — Ambulatory Visit: Payer: Medicare Other

## 2022-11-03 DIAGNOSIS — M6259 Muscle wasting and atrophy, not elsewhere classified, multiple sites: Secondary | ICD-10-CM | POA: Diagnosis not present

## 2022-11-03 DIAGNOSIS — M6281 Muscle weakness (generalized): Secondary | ICD-10-CM | POA: Diagnosis not present

## 2022-11-03 DIAGNOSIS — G8191 Hemiplegia, unspecified affecting right dominant side: Secondary | ICD-10-CM | POA: Diagnosis not present

## 2022-11-03 DIAGNOSIS — R55 Syncope and collapse: Secondary | ICD-10-CM | POA: Diagnosis not present

## 2022-11-03 DIAGNOSIS — R262 Difficulty in walking, not elsewhere classified: Secondary | ICD-10-CM | POA: Diagnosis not present

## 2022-11-04 ENCOUNTER — Encounter: Payer: Self-pay | Admitting: Nurse Practitioner

## 2022-11-04 ENCOUNTER — Ambulatory Visit (INDEPENDENT_AMBULATORY_CARE_PROVIDER_SITE_OTHER): Payer: Medicare Other | Admitting: Nurse Practitioner

## 2022-11-04 VITALS — BP 126/60 | HR 69 | Temp 98.6°F | Ht 69.5 in | Wt 151.8 lb

## 2022-11-04 DIAGNOSIS — I700 Atherosclerosis of aorta: Secondary | ICD-10-CM

## 2022-11-04 DIAGNOSIS — Z0001 Encounter for general adult medical examination with abnormal findings: Secondary | ICD-10-CM

## 2022-11-04 DIAGNOSIS — Z0000 Encounter for general adult medical examination without abnormal findings: Secondary | ICD-10-CM

## 2022-11-04 DIAGNOSIS — I10 Essential (primary) hypertension: Secondary | ICD-10-CM

## 2022-11-04 DIAGNOSIS — R7309 Other abnormal glucose: Secondary | ICD-10-CM | POA: Diagnosis not present

## 2022-11-04 DIAGNOSIS — Z79899 Other long term (current) drug therapy: Secondary | ICD-10-CM | POA: Diagnosis not present

## 2022-11-04 DIAGNOSIS — R319 Hematuria, unspecified: Secondary | ICD-10-CM | POA: Diagnosis not present

## 2022-11-04 DIAGNOSIS — E7800 Pure hypercholesterolemia, unspecified: Secondary | ICD-10-CM | POA: Diagnosis not present

## 2022-11-04 LAB — POCT URINALYSIS DIPSTICK
Bilirubin, UA: NEGATIVE
Glucose, UA: NEGATIVE
Nitrite, UA: NEGATIVE
Protein, UA: POSITIVE — AB
Spec Grav, UA: 1.02 (ref 1.010–1.025)
Urobilinogen, UA: 0.2 E.U./dL
pH, UA: 7 (ref 5.0–8.0)

## 2022-11-04 NOTE — Progress Notes (Signed)
I,Tianna Badgett,acting as a Education administrator for [employer], FNP.,have documented all relevant documentation on the behalf of [name], FNP,as directed by  [name], FNP while in the presence of [name], [place].  Subjective:     Patient ID: Judy [name] , female    DOB: April 26, 1946 , 77 y.o.   MRN: [id number]   Chief Complaint  Patient presents with   Annual Exam    HPI  The patient is here for HM.  She continues to see the psychiatrist. She is trying to cut back on caffeine and sweet tea.   Wt Readings from Last 3 Encounters: 11/04/22 : 151 lb 12.8 oz (68.9 kg) 08/11/22 : 155 lb 6 oz (70.5 kg) 08/10/22 : 153 lb (69.4 kg)    Hypertension This is a chronic problem. The current episode started more than 1 year ago. The problem is unchanged. The problem is controlled. Pertinent negatives include no anxiety, chest pain, headaches or palpitations. Risk factors for coronary artery disease include sedentary lifestyle. Past treatments include calcium channel blockers. There are no compliance problems.      Past Medical History:  Diagnosis Date   Allergic rhinitis    Anemia    Anxiety    Arthritis    Bipolar 1 disorder (HCC)    Depression    Diverticulosis of colon (without mention of hemorrhage) 08/25/2011   [name]   Hearing loss in left ear    since birth   Hyperlipidemia    Hypertension    Insomnia    Obesity    Renal disorder    Tardive dyskinesia    Tremor    Vertigo      Family History  Problem Relation Age of Onset   Prostate cancer Brother    Hypertension Brother    Kidney disease Brother    Colon cancer Brother    Hypertension Mother    Kidney disease Mother    Stomach cancer Father    Hyperlipidemia Sister    Hypothyroidism Sister    Stroke Brother    Prostate cancer Brother      Current Outpatient Medications:    acetaminophen (TYLENOL) 325 MG tablet, Take 2 tablets (650 mg total) by mouth every 6 (six) hours as needed for mild pain (or  Fever >/= 101)., Disp: , Rfl:    albuterol (VENTOLIN HFA) 108 (90 Base) MCG/ACT inhaler, Inhale 2 puffs into the lungs every 6 (six) hours as needed for wheezing or shortness of breath. Use with spacer, Disp: 8 g, Rfl: 2   atorvastatin (LIPITOR) 10 MG tablet, Take 1 tablet (10 mg total) by mouth daily., Disp: 90 tablet, Rfl: 1   b complex vitamins capsule, Take 1 capsule by mouth daily., Disp: , Rfl:    buPROPion (WELLBUTRIN XL) 150 MG 24 hr tablet, TAKE 1 TABLET BY MOUTH EVERY DAY, Disp: 90 tablet, Rfl: 0   cholecalciferol (VITAMIN D3) 25 MCG (1000 UNIT) tablet, Take 1,000 Units by mouth daily., Disp: , Rfl:    INGREZZA 80 MG capsule, Take 1 capsule (80 mg total) by mouth daily., Disp: 30 capsule, Rfl: 2   meclizine (ANTIVERT) 12.5 MG tablet, Take 1 tablet (12.5 mg total) by mouth 3 (three) times daily as needed for dizziness., Disp: 30 tablet, Rfl: 0   mirtazapine (REMERON) 7.5 MG tablet, Take 0.5-1 tablets (3.75-7.5 mg total) by mouth at bedtime as needed., Disp: 90 tablet, Rfl: 0   Multiple Vitamin (MULTIVITAMIN) capsule, Take 1 capsule by mouth daily., Disp: ,  Rfl:    Omega-3 Fatty Acids (FISH OIL BURP-LESS PO), Take by mouth., Disp: , Rfl:    polyethylene glycol (MIRALAX / GLYCOLAX) 17 g packet, Take 17 g by mouth daily., Disp: 30 each, Rfl: 2   propranolol (INDERAL) 10 MG tablet, Take 1 tablet (10 mg total) by mouth 3 (three) times daily as needed., Disp: 90 tablet, Rfl: 6   No Known Allergies    The patient states she uses post menopausal status for birth control. Last LMP was No LMP recorded. Patient is postmenopausal.. Negative for Dysmenorrhea and Negative for Menorrhagia. Negative for: breast discharge, breast lump(s), breast pain and breast self exam. Associated symptoms include abnormal vaginal bleeding. Pertinent negatives include abnormal bleeding (hematology), anxiety, decreased libido, depression, difficulty falling sleep, dyspareunia, history of infertility, nocturia, sexual  dysfunction, sleep disturbances, urinary incontinence, urinary urgency, vaginal discharge and vaginal itching. Diet regular.The patient states her exercise level is she will walk around the apartment complex 2 days a week when she takes her trash out and go to the mailbox. She is not interested in going to PREP. No falls.   The patient's tobacco use is:  Social History   Tobacco Use  Smoking Status Never  Smokeless Tobacco Never  . She has been exposed to passive smoke. The patient's alcohol use is:  Social History   Substance and Sexual Activity  Alcohol Use Never     Review of Systems  Constitutional: Negative.   HENT: Negative.    Eyes: Negative.   Respiratory: Negative.    Cardiovascular: Negative.  Negative for chest pain and palpitations.  Gastrointestinal: Negative.   Endocrine: Negative.   Genitourinary: Negative.   Musculoskeletal: Negative.   Skin: Negative.   Allergic/Immunologic: Negative.   Neurological: Negative.  Negative for headaches.  Hematological: Negative.   Psychiatric/Behavioral: Negative.       Today's Vitals   11/04/22 1421  BP: 126/60  Pulse: 69  Temp: 98.6 F (37 C)  TempSrc: Oral  Weight: 151 lb 12.8 oz (68.9 kg)  Height: 5' 9.5" (1.765 m)   Body mass index is 22.1 kg/m.   Objective:  Physical Exam Vitals reviewed.  Constitutional:      General: She is not in acute distress.    Appearance: Normal appearance. She is well-developed. She is obese.  HENT:     Head: Normocephalic and atraumatic.     Right Ear: Hearing, tympanic membrane, ear canal and external ear normal. There is no impacted cerumen.     Left Ear: Hearing, tympanic membrane, ear canal and external ear normal. There is no impacted cerumen.     Nose:     Comments: Deferred - masked    Mouth/Throat:     Comments: Deferred - masked Eyes:     General: Lids are normal.     Extraocular Movements: Extraocular movements intact.     Conjunctiva/sclera: Conjunctivae normal.      Pupils: Pupils are equal, round, and reactive to light.     Funduscopic exam:    Right eye: No papilledema.        Left eye: No papilledema.  Neck:     Thyroid: No thyroid mass.     Vascular: No carotid bruit.  Cardiovascular:     Rate and Rhythm: Normal rate and regular rhythm.     Pulses: Normal pulses.     Heart sounds: Normal heart sounds. No murmur heard. Pulmonary:     Effort: Pulmonary effort is normal. No respiratory distress.  Breath sounds: Normal breath sounds. No wheezing.  Chest:     Chest wall: No mass.  Breasts:    Tanner Score is 5.     Right: Normal. No mass or tenderness.     Left: Normal. No mass or tenderness.  Abdominal:     General: Abdomen is flat. Bowel sounds are normal. There is no distension.     Palpations: Abdomen is soft.     Tenderness: There is no abdominal tenderness.  Genitourinary:    Rectum: Guaiac result negative.  Musculoskeletal:        General: No swelling or tenderness. Normal range of motion.     Cervical back: Full passive range of motion without pain, normal range of motion and neck supple.     Right lower leg: No edema.     Left lower leg: No edema.  Lymphadenopathy:     Upper Body:     Right upper body: No supraclavicular, axillary or pectoral adenopathy.     Left upper body: No supraclavicular, axillary or pectoral adenopathy.  Skin:    General: Skin is warm and dry.     Capillary Refill: Capillary refill takes less than 2 seconds.  Neurological:     General: No focal deficit present.     Mental Status: She is alert and oriented to person, place, and time.     Cranial Nerves: No cranial nerve deficit.     Sensory: No sensory deficit.     Motor: No weakness.     Comments: She has mild uncontrolled tremors  Psychiatric:        Mood and Affect: Mood normal.        Behavior: Behavior normal.        Thought Content: Thought content normal.        Judgment: Judgment normal.         Assessment And Plan:     1.  Encounter for annual physical exam Behavior modifications discussed and diet history reviewed.   Pt will continue to exercise regularly and modify diet with low GI, plant based foods and decrease intake of processed foods.  Recommend intake of daily multivitamin, Vitamin D, and calcium.  Recommend mammogram and colonoscopy for preventive screenings, as well as recommend immunizations that include influenza, TDAP, and Shingles  2. Essential hypertension, benign Comments: Blood pressure is well-controlled.  Continue current medications. - Microalbumin / Creatinine Urine Ratio - POCT Urinalysis Dipstick (81002) - CMP14+EGFR  3. Aortic atherosclerosis (HCC) Comments: Continue statin, tolerating well.  4. Pure hypercholesterolemia Comments: Cholesterol levels are stable.  Continue statin - Lipid panel  5. Abnormal glucose Comments: Diet controlled.  Encouraged to focus on diet low in sugar and carbs. - Hemoglobin A1c  6. Hematuria, unspecified type Comments: Positive blood in urine.  Denies any symptoms.  Will send urine culture. - Culture, Urine  7. Other long term (current) drug therapy - CBC   Patient was given opportunity to ask questions. Patient verbalized understanding of the plan and was able to repeat key elements of the plan. All questions were answered to their satisfaction.   [name], FNP   I, [name], FNP, have reviewed all documentation for this visit. The documentation on 11/04/22 for the exam, diagnosis, procedures, and orders are all accurate and complete.   THE PATIENT IS ENCOURAGED TO PRACTICE SOCIAL DISTANCING DUE TO THE COVID-19 PANDEMIC.

## 2022-11-04 NOTE — Patient Instructions (Addendum)

## 2022-11-06 LAB — CMP14+EGFR
ALT: 42 IU/L — ABNORMAL HIGH (ref 0–32)
AST: 37 IU/L (ref 0–40)
Albumin/Globulin Ratio: 1.8 (ref 1.2–2.2)
Albumin: 4.5 g/dL (ref 3.8–4.8)
Alkaline Phosphatase: 72 IU/L (ref 44–121)
BUN/Creatinine Ratio: 12 (ref 12–28)
BUN: 12 mg/dL (ref 8–27)
Bilirubin Total: 0.3 mg/dL (ref 0.0–1.2)
CO2: 23 mmol/L (ref 20–29)
Calcium: 9.9 mg/dL (ref 8.7–10.3)
Chloride: 104 mmol/L (ref 96–106)
Creatinine, Ser: 1.02 mg/dL — ABNORMAL HIGH (ref 0.57–1.00)
Globulin, Total: 2.5 g/dL (ref 1.5–4.5)
Glucose: 88 mg/dL (ref 70–99)
Potassium: 4.2 mmol/L (ref 3.5–5.2)
Sodium: 141 mmol/L (ref 134–144)
Total Protein: 7 g/dL (ref 6.0–8.5)
eGFR: 57 mL/min/{1.73_m2} — ABNORMAL LOW (ref >59)

## 2022-11-06 LAB — LIPID PANEL
Chol/HDL Ratio: 2.1 ratio (ref 0.0–4.4)
Cholesterol, Total: 151 mg/dL (ref 100–199)
HDL: 72 mg/dL (ref >39)
LDL Chol Calc (NIH): 63 mg/dL (ref 0–99)
Triglycerides: 86 mg/dL (ref 0–149)
VLDL Cholesterol Cal: 16 mg/dL (ref 5–40)

## 2022-11-06 LAB — CBC
Hematocrit: 43 % (ref 34.0–46.6)
Hemoglobin: 14.5 g/dL (ref 11.1–15.9)
MCH: 30.7 pg (ref 26.6–33.0)
MCHC: 33.7 g/dL (ref 31.5–35.7)
MCV: 91 fL (ref 79–97)
Platelets: 245 10*3/uL (ref 150–450)
RBC: 4.72 x10E6/uL (ref 3.77–5.28)
RDW: 13.2 % (ref 11.7–15.4)
WBC: 6.4 10*3/uL (ref 3.4–10.8)

## 2022-11-06 LAB — URINE CULTURE

## 2022-11-06 LAB — MICROALBUMIN / CREATININE URINE RATIO
Creatinine, Urine: 222.7 mg/dL
Microalb/Creat Ratio: 18 mg/g creat (ref 0–29)
Microalbumin, Urine: 39.3 ug/mL

## 2022-11-06 LAB — HEMOGLOBIN A1C
Est. average glucose Bld gHb Est-mCnc: 114 mg/dL
Hgb A1c MFr Bld: 5.6 % (ref 4.8–5.6)

## 2022-11-08 ENCOUNTER — Ambulatory Visit: Payer: Medicare Other | Admitting: Internal Medicine

## 2022-11-08 ENCOUNTER — Encounter: Payer: Self-pay | Admitting: Internal Medicine

## 2022-11-08 VITALS — BP 136/78 | HR 71 | Ht 69.5 in | Wt 147.0 lb

## 2022-11-08 DIAGNOSIS — I10 Essential (primary) hypertension: Secondary | ICD-10-CM | POA: Diagnosis not present

## 2022-11-08 DIAGNOSIS — E782 Mixed hyperlipidemia: Secondary | ICD-10-CM | POA: Diagnosis not present

## 2022-11-08 MED ORDER — PROPRANOLOL HCL 10 MG PO TABS: 10.0000 mg | ORAL_TABLET | Freq: Three times a day (TID) | ORAL | 6 refills | Status: DC | PRN

## 2022-11-08 NOTE — Progress Notes (Signed)
Primary Physician/Referring:  [name], FNP  Patient ID: Judy [name], female    DOB: [birth year], 77 y.o.   MRN: [id number]  Chief Complaint  Patient presents with   Shortness of Breath   Follow-up   HPI:    Judy [name]  is a 77 y.o. AA female with history of hypertension, hyperlipidemia, and pulmonary embolism in 2021. She presents today to establish care with cardiology due to worsening shortness of breath. She did have a syncopal fall in July 2023 and was hospitalized then sent to rehab. She does live at home alone now and works with PT and OT. She had previously resided in nursing home in 2021 during which time she developed a PE. She was treated with anticoagulants for 3 months.   She presents today for follow-up visit. She has been doing well since the last time she was here. She has been moving more and is not really having so much shortness of breath. No side effects to medications reported by patient. She has been feeling well overall. She denies chest pain, dizziness, palpitations, dizziness, lightheadedness.  Past Medical History:  Diagnosis Date   Allergic rhinitis    Anemia    Anxiety    Arthritis    Bipolar 1 disorder (HCC)    Depression    Diverticulosis of colon (without mention of hemorrhage) 08/25/2011   [name]   Hearing loss in left ear    since birth   Hyperlipidemia    Hypertension    Insomnia    Obesity    Renal disorder    Tardive dyskinesia    Tremor    Vertigo    Past Surgical History:  Procedure Laterality Date   COLONOSCOPY  08/25/2011   Procedure: COLONOSCOPY;  Surgeon: Landry Dyke, MD;  Location: [facility];  Service: Endoscopy;  Laterality: N/A;   DILATION AND CURETTAGE OF UTERUS  69's   Family History  Problem Relation Age of Onset   Prostate cancer Brother    Hypertension Brother    Kidney disease Brother    Colon cancer Brother    Hypertension Mother    Kidney disease Mother    Stomach cancer Father     Hyperlipidemia Sister    Hypothyroidism Sister    Stroke Brother    Prostate cancer Brother     Social History   Tobacco Use   Smoking status: Never   Smokeless tobacco: Never  Substance Use Topics   Alcohol use: Never   Marital Status: Single  ROS  Review of Systems  Cardiovascular:  Negative for chest pain, dyspnea on exertion, leg swelling, palpitations, paroxysmal nocturnal dyspnea and syncope.  Respiratory:  Negative for shortness of breath.   Neurological:  Negative for dizziness and light-headedness.   Objective  Blood pressure 136/78, pulse 71, height 5' 9.5" (1.765 m), weight 147 lb (66.7 kg), SpO2 96 %. Body mass index is 21.4 kg/m.     11/08/2022   11:33 AM 11/04/2022    2:21 PM 08/11/2022   11:26 AM  Vitals with BMI  Height 5' 9.5" 5' 9.5" 5' 9.5"  Weight 147 lbs 151 lbs 13 oz 155 lbs 6 oz  BMI 21.4 78.2 95.62  Systolic 130 865 784  Diastolic 78 60 87  Pulse 71 69 70    Physical Exam Cardiovascular:     Rate and Rhythm: Normal rate and regular rhythm.     Pulses:          Carotid pulses  are 2+ on the right side and 2+ on the left side.      Radial pulses are 2+ on the right side and 2+ on the left side.       Dorsalis pedis pulses are 1+ on the right side and 1+ on the left side.       Posterior tibial pulses are 1+ on the right side and 1+ on the left side.     Heart sounds: Normal heart sounds. No murmur heard. Pulmonary:     Effort: Pulmonary effort is normal.     Breath sounds: Normal breath sounds. No wheezing or rales.  Abdominal:     General: Bowel sounds are normal.     Palpations: Abdomen is soft.     Tenderness: There is no abdominal tenderness.  Musculoskeletal:     Right lower leg: No edema.     Left lower leg: No edema.    Medications and allergies  No Known Allergies   Medication list after today's encounter   Current Outpatient Medications:    acetaminophen (TYLENOL) 325 MG tablet, Take 2 tablets (650 mg total) by mouth every 6  (six) hours as needed for mild pain (or Fever >/= 101)., Disp: , Rfl:    albuterol (VENTOLIN HFA) 108 (90 Base) MCG/ACT inhaler, Inhale 2 puffs into the lungs every 6 (six) hours as needed for wheezing or shortness of breath. Use with spacer, Disp: 8 g, Rfl: 2   atorvastatin (LIPITOR) 10 MG tablet, Take 1 tablet (10 mg total) by mouth daily., Disp: 90 tablet, Rfl: 1   b complex vitamins capsule, Take 1 capsule by mouth daily., Disp: , Rfl:    buPROPion (WELLBUTRIN XL) 150 MG 24 hr tablet, Take 1 tablet (150 mg total) by mouth daily., Disp: 90 tablet, Rfl: 0   cholecalciferol (VITAMIN D3) 25 MCG (1000 UNIT) tablet, Take 1,000 Units by mouth daily., Disp: , Rfl:    INGREZZA 80 MG capsule, Take 1 capsule (80 mg total) by mouth daily., Disp: 30 capsule, Rfl: 2   meclizine (ANTIVERT) 12.5 MG tablet, Take 1 tablet (12.5 mg total) by mouth 3 (three) times daily as needed for dizziness., Disp: 30 tablet, Rfl: 0   mirtazapine (REMERON) 7.5 MG tablet, Take 0.5-1 tablets (3.75-7.5 mg total) by mouth at bedtime as needed., Disp: 90 tablet, Rfl: 0   Multiple Vitamin (MULTIVITAMIN) capsule, Take 1 capsule by mouth daily., Disp: , Rfl:    Omega-3 Fatty Acids (FISH OIL BURP-LESS PO), Take by mouth., Disp: , Rfl:    polyethylene glycol (MIRALAX / GLYCOLAX) 17 g packet, Take 17 g by mouth daily., Disp: 30 each, Rfl: 2   propranolol (INDERAL) 10 MG tablet, Take 1 tablet (10 mg total) by mouth 3 (three) times daily as needed., Disp: 90 tablet, Rfl: 6  Laboratory examination:   Lab Results  Component Value Date   NA 141 11/04/2022   K 4.2 11/04/2022   CO2 23 11/04/2022   GLUCOSE 88 11/04/2022   BUN 12 11/04/2022   CREATININE 1.02 (H) 11/04/2022   CALCIUM 9.9 11/04/2022   EGFR 57 (L) 11/04/2022   GFRNONAA >60 04/28/2022       Latest Ref Rng & Units 11/04/2022    3:14 PM 06/28/2022   11:05 AM 05/31/2022   10:33 AM  CMP  Glucose 70 - 99 mg/dL 88  101  108   BUN 8 - 27 mg/dL '12  9  11   '$ Creatinine 0.57 -  1.00 mg/dL 1.02  0.84  0.84   Sodium 134 - 144 mmol/L 141  144  143   Potassium 3.5 - 5.2 mmol/L 4.2  4.0  4.1   Chloride 96 - 106 mmol/L 104  105  106   CO2 20 - 29 mmol/L '23  24  24   '$ Calcium 8.7 - 10.3 mg/dL 9.9  10.1  10.2   Total Protein 6.0 - 8.5 g/dL 7.0   6.7   Total Bilirubin 0.0 - 1.2 mg/dL 0.3   0.4   Alkaline Phos 44 - 121 IU/L 72   87   AST 0 - 40 IU/L 37   20   ALT 0 - 32 IU/L 42   15       Latest Ref Rng & Units 11/04/2022    3:14 PM 05/31/2022   10:33 AM 04/28/2022    4:37 AM  CBC  WBC 3.4 - 10.8 x10E3/uL 6.4  4.9  5.1   Hemoglobin 11.1 - 15.9 g/dL 14.5  13.4  12.2   Hematocrit 34.0 - 46.6 % 43.0  41.6  36.7   Platelets 150 - 450 x10E3/uL 245  260  218    Lipid Panel Recent Labs    03/02/22 1539 11/04/22 1514  CHOL 156 151  TRIG 77 86  LDLCALC 71 63  HDL 70 72  CHOLHDL 2.2 2.1   HEMOGLOBIN A1C Lab Results  Component Value Date   HGBA1C 5.6 11/04/2022   MPG 117 03/25/2017   TSH Recent Labs    06/28/22 1105  TSH 1.070   External labs:   Radiology:   Chest CT 07/16/22: IMPRESSION: 1. Stable 7 mm right upper lobe ground-glass nodule. This exam constitutes 4 years of imaging stability. One additional follow-up CT is recommended in 1 year to establish 5 years of imaging stability, at that time the nodule can be considered definitively benign. 2. Small pericardial effusion. Cardiovascular: The heart is normal in size. Occasional coronary artery calcifications. Mild atherosclerosis of the thoracic aorta. The aorta is tortuous but nonaneurysmal. There is a small pericardial effusion measuring up to 7 mm.  Cardiac Studies:   [place] Nuclear stress test 07/19/2022: Myocardial perfusion is abnormal. There is a fixed mild defect in the inferior and septal regions. There is a partially reversible mild defect in the apical region.  Overall LV systolic function is abnormal without regional wall motion abnormalities. Mild global hypokinesis of the left  ventricle. Stress LV EF is mildly dysfunctional 45%.  Non-diagnostic ECG stress. The heart rate response was consistent with Regadenoson.  Report only comparison from 10/19/2016 reviewed similar inferior and inferolateral defect with partial reversibility with EF 60%. Low risk.   Echo 04/21/22: 1. Left ventricular ejection fraction, by estimation, is 60 to 65%. The left ventricle has normal function. The left ventricle has no regional wall motion abnormalities. There is moderate concentric left ventricular hypertrophy. Left ventricular  diastolic parameters are consistent with Grade I diastolic dysfunction (impaired relaxation).   2. Right ventricular systolic function is normal. The right ventricular size is normal. There is normal pulmonary artery systolic pressure. The estimated right ventricular systolic pressure is 51.0 mmHg.   3. The mitral valve is normal in structure. Trivial mitral valve  regurgitation. No evidence of mitral stenosis.   4. The aortic valve is normal in structure. Aortic valve regurgitation is  not visualized. No aortic stenosis is present.   5. The inferior vena cava is normal in size with greater than 50%  respiratory variability, suggesting right atrial  pressure of 3 mmHg.   EKG:   EKG 07/02/22: Sinus Rhythm with isolated PAC. Normal axis. Poor R-wave progression. Cannot exclude old anterior infarct. Nonspecific T-abnormality.  Assessment     ICD-10-CM   1. Essential hypertension  I10     2. Mixed hyperlipidemia  E78.2       No orders of the defined types were placed in this encounter.   Meds ordered this encounter  Medications   propranolol (INDERAL) 10 MG tablet    Sig: Take 1 tablet (10 mg total) by mouth 3 (three) times daily as needed.    Dispense:  90 tablet    Refill:  6    There are no discontinued medications.    Recommendations:   MERRIEL ZINGER is a 77 y.o.  AA female with history of hypertension, hyperlipidemia, and pulmonary embolism  in 2021. [name]   Primary hypertension Continue current cardiac medications. Encourage low-sodium diet, less than 2000 mg daily. BP very well controlled today   Mixed hyperlipidemia Continue on Atorvastatin '10mg'$  without myalgias.  Follow-up in 6 months of sooner if needed.   Floydene Flock, DO, [facility]  11/08/2022, 4:45 PM Office: [phone] Pager: [phone]

## 2022-11-11 ENCOUNTER — Other Ambulatory Visit: Payer: Self-pay | Admitting: Nurse Practitioner

## 2022-11-11 DIAGNOSIS — E2839 Other primary ovarian failure: Secondary | ICD-10-CM

## 2022-11-11 DIAGNOSIS — Z1231 Encounter for screening mammogram for malignant neoplasm of breast: Secondary | ICD-10-CM

## 2022-11-13 ENCOUNTER — Other Ambulatory Visit: Payer: Self-pay | Admitting: Physician Assistant

## 2022-11-22 ENCOUNTER — Telehealth: Payer: Self-pay

## 2022-11-22 NOTE — Telephone Encounter (Signed)
Patient's brother, [name] [facility]), called to report patient is experiencing abnormal uterine bleeding. He states patient woke up with blood in her bedsheets this morning. He requested appointment here ASAP, advised earliest available was Wednesday. Appointment scheduled as requested. He also inquired about referral to Gyn that was placed recently, stating they have not heard from anyone. Provided referral info to him so he could contact their office.   [name] called back to advise the [place] office patient was referred to cannot see he until April. They do not want to wait this long. He would like multiple referrals placed to various offices to see who they could get in with the soonest. Advised him I would send message to [facility] for recommendations.  Please advise, thank you!

## 2022-11-24 ENCOUNTER — Encounter: Payer: Self-pay | Admitting: Nurse Practitioner

## 2022-11-24 ENCOUNTER — Ambulatory Visit (INDEPENDENT_AMBULATORY_CARE_PROVIDER_SITE_OTHER): Payer: Medicare Other | Admitting: Nurse Practitioner

## 2022-11-24 VITALS — BP 138/72 | HR 62 | Temp 98.0°F | Ht 69.0 in | Wt 150.0 lb

## 2022-11-24 DIAGNOSIS — R5383 Other fatigue: Secondary | ICD-10-CM

## 2022-11-24 DIAGNOSIS — N950 Postmenopausal bleeding: Secondary | ICD-10-CM | POA: Diagnosis not present

## 2022-11-24 DIAGNOSIS — I10 Essential (primary) hypertension: Secondary | ICD-10-CM | POA: Diagnosis not present

## 2022-11-24 DIAGNOSIS — R319 Hematuria, unspecified: Secondary | ICD-10-CM | POA: Diagnosis not present

## 2022-11-24 LAB — POCT URINALYSIS DIPSTICK
Bilirubin, UA: NEGATIVE
Blood, UA: POSITIVE — AB
Glucose, UA: NEGATIVE
Ketones, UA: NEGATIVE
Nitrite, UA: NEGATIVE
Protein, UA: POSITIVE — AB
Spec Grav, UA: 1.02 (ref 1.010–1.025)
Urobilinogen, UA: 0.2 E.U./dL
pH, UA: 7.5 (ref 5.0–8.0)

## 2022-11-24 NOTE — Progress Notes (Signed)
Barnet Glasgow Martin,acting as a Education administrator for [name], FNP.,have documented all relevant documentation on the behalf of [name], FNP,as directed by  [name], FNP while in the presence of [name], [place].    Subjective:     Patient ID: Judy [name] , female    DOB: 1946-01-11 , 77 y.o.   MRN: [id number]   No chief complaint on file.   HPI  Patient presents today for abnormal bleeding anytime. Patient states having a little burning.  She has an appt in April with GYN she is wondering if she can get something sooner with a different GYN. Denies stomach pain.   She has been feeling tired and sluggish with her propranolol.   Patient presents with her sister in law -Blanch Media.  BP Readings from Last 3 Encounters: 11/24/22 : 138/72 11/08/22 : 136/78 11/04/22 : 126/60       Past Medical History:  Diagnosis Date   Allergic rhinitis    Anemia    Anxiety    Arthritis    Bipolar 1 disorder (HCC)    Depression    Diverticulosis of colon (without mention of hemorrhage) 08/25/2011   [name]   Hearing loss in left ear    since birth   Hyperlipidemia    Hypertension    Insomnia    Obesity    Renal disorder    Tardive dyskinesia    Tremor    Vertigo      Family History  Problem Relation Age of Onset   Prostate cancer Brother    Hypertension Brother    Kidney disease Brother    Colon cancer Brother    Hypertension Mother    Kidney disease Mother    Stomach cancer Father    Hyperlipidemia Sister    Hypothyroidism Sister    Stroke Brother    Prostate cancer Brother      Current Outpatient Medications:    acetaminophen (TYLENOL) 325 MG tablet, Take 2 tablets (650 mg total) by mouth every 6 (six) hours as needed for mild pain (or Fever >/= 101)., Disp: , Rfl:    albuterol (VENTOLIN HFA) 108 (90 Base) MCG/ACT inhaler, Inhale 2 puffs into the lungs every 6 (six) hours as needed for wheezing or shortness of breath. Use with spacer, Disp: 8 g, Rfl: 2    atorvastatin (LIPITOR) 10 MG tablet, Take 1 tablet (10 mg total) by mouth daily., Disp: 90 tablet, Rfl: 1   b complex vitamins capsule, Take 1 capsule by mouth daily., Disp: , Rfl:    buPROPion (WELLBUTRIN XL) 150 MG 24 hr tablet, TAKE 1 TABLET BY MOUTH EVERY DAY, Disp: 90 tablet, Rfl: 0   cholecalciferol (VITAMIN D3) 25 MCG (1000 UNIT) tablet, Take 1,000 Units by mouth daily., Disp: , Rfl:    INGREZZA 80 MG capsule, Take 1 capsule (80 mg total) by mouth daily., Disp: 30 capsule, Rfl: 2   meclizine (ANTIVERT) 12.5 MG tablet, Take 1 tablet (12.5 mg total) by mouth 3 (three) times daily as needed for dizziness., Disp: 30 tablet, Rfl: 0   mirtazapine (REMERON) 7.5 MG tablet, Take 0.5-1 tablets (3.75-7.5 mg total) by mouth at bedtime as needed., Disp: 90 tablet, Rfl: 0   Multiple Vitamin (MULTIVITAMIN) capsule, Take 1 capsule by mouth daily., Disp: , Rfl:    Omega-3 Fatty Acids (FISH OIL BURP-LESS PO), Take by mouth., Disp: , Rfl:    polyethylene glycol (MIRALAX / GLYCOLAX) 17 g packet, Take 17 g by mouth daily., Disp: 30  each, Rfl: 2   propranolol (INDERAL) 10 MG tablet, Take 1 tablet (10 mg total) by mouth 3 (three) times daily as needed., Disp: 90 tablet, Rfl: 6   No Known Allergies   Review of Systems  Constitutional: Negative.   HENT: Negative.    Eyes: Negative.   Respiratory: Negative.    Cardiovascular: Negative.   Gastrointestinal: Negative.      Today's Vitals   11/24/22 1128  BP: 138/72  Pulse: 62  Temp: 98 F (36.7 C)  TempSrc: Oral  Weight: 150 lb (68 kg)  Height: 5' 9"$  (1.753 m)  PainSc: 0-No pain   Body mass index is 22.15 kg/m.  Wt Readings from Last 3 Encounters:  11/24/22 150 lb (68 kg)  11/08/22 147 lb (66.7 kg)  11/04/22 151 lb 12.8 oz (68.9 kg)    Objective:  Physical Exam Vitals reviewed.  Pulmonary:     Effort: Pulmonary effort is normal. No respiratory distress.     Breath sounds: Normal breath sounds.  Abdominal:     General: Abdomen is flat.  Bowel sounds are normal. There is no distension.     Palpations: Abdomen is soft. There is no mass.         Assessment And Plan:     1. Post-menopausal bleeding Comments: will try to get her set up with another GYN to get her in earlier, has continued to have postmenopausal bleeding. Urinalysis did show blood in urine - POCT Urinalysis Dipstick (81002) - CBC - Ambulatory referral to Gynecology  2. Fatigue, unspecified type Comments: Advised to call Cardiology about her propranolol. Will also check for any metabolic causes. - TSH - Vitamin B12 - Iron, TIBC and Ferritin Panel - Vitamin D (25 hydroxy)  3. Hematuria, unspecified type Comments: Will send urine culture to evaluate for infection - Urine Culture  4. Essential hypertension, benign Comments: Slightly elevated, discussed lifestyle modifications     Patient and her sister in law were given opportunity to ask questions. Patient and sister in law verbalized understanding of the plan and was able to repeat key elements of the plan. All questions were answered to their satisfaction.  [name], FNP   I, [name], FNP, have reviewed all documentation for this visit. The documentation on 11/24/22 for the exam, diagnosis, procedures, and orders are all accurate and complete.   IF YOU HAVE BEEN REFERRED TO A SPECIALIST, IT MAY TAKE 1-2 WEEKS TO SCHEDULE/PROCESS THE REFERRAL. IF YOU HAVE NOT HEARD FROM US/SPECIALIST IN TWO WEEKS, PLEASE GIVE [place] A CALL AT [phone].   THE PATIENT IS ENCOURAGED TO PRACTICE SOCIAL DISTANCING DUE TO THE COVID-19 PANDEMIC.

## 2022-11-24 NOTE — Patient Instructions (Signed)
Abnormal Uterine Bleeding Abnormal uterine bleeding means bleeding more than normal from your womb (uterus). It can include: Bleeding after sex. Bleeding between monthly (menstrual) periods. Bleeding that is heavier than normal. Monthly periods that last longer than normal. Bleeding after you have stopped having your monthly period (menopause). You should see a doctor for any kind of bleeding that is not normal. Treatment depends on the cause of your bleeding and how much you bleed. Follow these instructions at home: Medicines Take over-the-counter and prescription medicines only as told by your doctor. Ask your doctor about: Taking medicines such as aspirin and ibuprofen. Do not take these medicines unless your doctor tells you to take them. Taking over-the-counter medicines, vitamins, herbs, and supplements. You may be given iron pills. Take them as told by your doctor. Managing constipation If you take iron pills, you may need to take these actions to prevent or treat trouble pooping (constipation): Drink enough fluid to keep your pee (urine) pale yellow. Take over-the-counter or prescription medicines. Eat foods that are high in fiber. These include beans, whole grains, and fresh fruits and vegetables. Limit foods that are high in fat and sugar. These include fried or sweet foods. Activity Change your activity to decrease bleeding if you need to change your sanitary pad more than one time every 2 hours: Lie in bed with your feet raised (elevated). Place a cold pack on your lower belly. Rest as much as you are able until the bleeding stops or slows down. General instructions Do not use tampons, douche, or have sex until your doctor says these things are okay. Change your pads often. Get regular exams. These include: Pelvic exams. Screenings for cancer of the cervix. It is up to you to get the results of any tests that are done. Ask how to get your results when they are  ready. Watch for any changes in your bleeding. For 2 months, write down: When your monthly period starts. When your monthly period ends. When you get any abnormal bleeding from your vagina. What problems you notice. Keep all follow-up visits. Contact a doctor if: The bleeding lasts more than one week. You feel dizzy at times. You feel like you may vomit (nausea). You vomit. You feel light-headed or weak. Your symptoms get worse. Get help right away if: You faint. You have to change pads every hour. You have pain in your belly. You have a fever or chills. You get sweaty or weak. You pass large blood clots from your vagina. These symptoms may be an emergency. Get help right away. Call your local emergency services (911 in the [place] Do not wait to see if the symptoms will go away. Do not drive yourself to the hospital. Summary Abnormal uterine bleeding means bleeding more than normal from your womb (uterus). Any kind of bleeding that is not normal should be checked by a doctor. Treatment depends on the cause of your bleeding and how much you bleed. Get help right away if you faint, you have to change pads every hour, or you pass large blood clots from your vagina. This information is not intended to replace advice given to you by your health care provider. Make sure you discuss any questions you have with your health care provider. Document Revised: 01/27/2021 Document Reviewed: 01/27/2021 Elsevier Patient Education  2023 [employer]  

## 2022-11-25 LAB — TSH: TSH: 0.995 u[IU]/mL (ref 0.450–4.500)

## 2022-11-25 LAB — CBC
Hematocrit: 45.5 % (ref 34.0–46.6)
Hemoglobin: 14.6 g/dL (ref 11.1–15.9)
MCH: 30.2 pg (ref 26.6–33.0)
MCHC: 32.1 g/dL (ref 31.5–35.7)
MCV: 94 fL (ref 79–97)
Platelets: 263 10*3/uL (ref 150–450)
RBC: 4.84 x10E6/uL (ref 3.77–5.28)
RDW: 13.5 % (ref 11.7–15.4)
WBC: 5.6 10*3/uL (ref 3.4–10.8)

## 2022-11-25 LAB — IRON,TIBC AND FERRITIN PANEL
Ferritin: 42 ng/mL (ref 15–150)
Iron Saturation: 34 % (ref 15–55)
Iron: 103 ug/dL (ref 27–139)
Total Iron Binding Capacity: 307 ug/dL (ref 250–450)
UIBC: 204 ug/dL (ref 118–369)

## 2022-11-25 LAB — VITAMIN B12: Vitamin B-12: 700 pg/mL (ref 232–1245)

## 2022-11-25 LAB — VITAMIN D 25 HYDROXY (VIT D DEFICIENCY, FRACTURES): Vit D, 25-Hydroxy: 36.9 ng/mL (ref 30.0–100.0)

## 2022-11-30 ENCOUNTER — Other Ambulatory Visit: Payer: Self-pay | Admitting: Nurse Practitioner

## 2022-11-30 ENCOUNTER — Telehealth: Payer: Self-pay

## 2022-11-30 DIAGNOSIS — N3001 Acute cystitis with hematuria: Secondary | ICD-10-CM

## 2022-11-30 LAB — URINE CULTURE

## 2022-11-30 MED ORDER — AMOXICILLIN 500 MG PO TABS: 500.0000 mg | ORAL_TABLET | Freq: Two times a day (BID) | ORAL | 0 refills | Status: DC

## 2022-11-30 NOTE — Telephone Encounter (Signed)
Patient's brother LVM asking about recent lab results. Nothing noted in chart.  Please advise, thank you!

## 2022-12-01 NOTE — Telephone Encounter (Signed)
Provider spoke with family. Nothing further needed at this time.

## 2022-12-04 DIAGNOSIS — G8191 Hemiplegia, unspecified affecting right dominant side: Secondary | ICD-10-CM | POA: Diagnosis not present

## 2022-12-04 DIAGNOSIS — R262 Difficulty in walking, not elsewhere classified: Secondary | ICD-10-CM | POA: Diagnosis not present

## 2022-12-04 DIAGNOSIS — M6281 Muscle weakness (generalized): Secondary | ICD-10-CM | POA: Diagnosis not present

## 2022-12-04 DIAGNOSIS — R55 Syncope and collapse: Secondary | ICD-10-CM | POA: Diagnosis not present

## 2022-12-04 DIAGNOSIS — M6259 Muscle wasting and atrophy, not elsewhere classified, multiple sites: Secondary | ICD-10-CM | POA: Diagnosis not present

## 2022-12-08 DIAGNOSIS — R0602 Shortness of breath: Secondary | ICD-10-CM | POA: Diagnosis not present

## 2022-12-08 DIAGNOSIS — G2401 Drug induced subacute dyskinesia: Secondary | ICD-10-CM | POA: Diagnosis not present

## 2022-12-08 DIAGNOSIS — R1310 Dysphagia, unspecified: Secondary | ICD-10-CM | POA: Diagnosis not present

## 2022-12-10 ENCOUNTER — Other Ambulatory Visit: Payer: Self-pay | Admitting: Physician Assistant

## 2022-12-16 ENCOUNTER — Ambulatory Visit (INDEPENDENT_AMBULATORY_CARE_PROVIDER_SITE_OTHER): Payer: Medicare Other

## 2022-12-16 ENCOUNTER — Ambulatory Visit: Payer: Medicare HMO | Admitting: Nurse Practitioner

## 2022-12-16 VITALS — Ht 69.0 in | Wt 155.0 lb

## 2022-12-16 DIAGNOSIS — Z0000 Encounter for general adult medical examination without abnormal findings: Secondary | ICD-10-CM

## 2022-12-16 NOTE — Patient Instructions (Signed)
[name] , Thank you for taking time to come for your Medicare Wellness Visit. I appreciate your ongoing commitment to your health goals. Please review the following plan we discussed and let me know if I can assist you in the future.   These are the goals we discussed:  Goals       Exercise 3x per week (30 min per time)      09/18/2019, would like to work up to 30 minutes per session      I need to work on my mobility and getting stronger (pt-stated)      Care Coordination Interventions: Placed successful outbound call to patient, she received a home aide to assist with bathing on Monday, she has not started PT Placed outbound call to [facility], spoke with [place], confirmed patient has a PT visit scheduled for today Placed unsuccessful outbound call to patient, left a detailed voice message for patient and advised of next scheduled nurse call       Manage My Medicine      Timeframe:  Long-Range Goal Priority:  High Start Date:  06/29/2022                                          Follow Up Date 08/04/2022    - call for medicine refill 2 or 3 days before it runs out - call if I am sick and can't take my medicine - keep a list of all the medicines I take; vitamins and herbals too    Why is this important?   These steps will help you keep on track with your medicines.   Notes:  -Please call if you have any questions       Patient Stated      11/20/2020, wants to weigh 150 pounds      Patient Stated      12/03/2021, no goals      Patient Stated      12/16/2022, wants to get right hand stronger        This is a list of the screening recommended for you and due dates:  Health Maintenance  Topic Date Due   DTaP/Tdap/Td vaccine (1 - Tdap) Never done   Pneumonia Vaccine (3 of 3 - PCV) 11/07/2020   Colon Cancer Screening  08/24/2021   COVID-19 Vaccine (5 - 2023-24 season) 06/11/2022   Medicare Annual Wellness Visit  12/16/2023   Flu Shot  Completed   DEXA scan (bone  density measurement)  Completed   Hepatitis C Screening: USPSTF Recommendation to screen - Ages 18-79 yo.  Completed   Zoster (Shingles) Vaccine  Completed   HPV Vaccine  Aged Out    Advanced directives: Please bring a copy of your POA (Power of [place]) and/or Living Will to your next appointment.   Conditions/risks identified: none  Next appointment: Follow up in one year for your annual wellness visit    Preventive Care 65 Years and Older, Female Preventive care refers to lifestyle choices and visits with your health care provider that can promote health and wellness. What does preventive care include? A yearly physical exam. This is also called an annual well check. Dental exams once or twice a year. Routine eye exams. Ask your health care provider how often you should have your eyes checked. Personal lifestyle choices, including: Daily care of your teeth and gums. Regular physical  activity. Eating a healthy diet. Avoiding tobacco and drug use. Limiting alcohol use. Practicing safe sex. Taking low-dose aspirin every day. Taking vitamin and mineral supplements as recommended by your health care provider. What happens during an annual well check? The services and screenings done by your health care provider during your annual well check will depend on your age, overall health, lifestyle risk factors, and family history of disease. Counseling  Your health care provider may ask you questions about your: Alcohol use. Tobacco use. Drug use. Emotional well-being. Home and relationship well-being. Sexual activity. Eating habits. History of falls. Memory and ability to understand (cognition). Work and work Statistician. Reproductive health. Screening  You may have the following tests or measurements: Height, weight, and BMI. Blood pressure. Lipid and cholesterol levels. These may be checked every 5 years, or more frequently if you are over 28 years old. Skin check. Lung  cancer screening. You may have this screening every year starting at age 78 if you have a 30-pack-year history of smoking and currently smoke or have quit within the past 15 years. Fecal occult blood test (FOBT) of the stool. You may have this test every year starting at age 30. Flexible sigmoidoscopy or colonoscopy. You may have a sigmoidoscopy every 5 years or a colonoscopy every 10 years starting at age 26. Hepatitis C blood test. Hepatitis B blood test. Sexually transmitted disease (STD) testing. Diabetes screening. This is done by checking your blood sugar (glucose) after you have not eaten for a while (fasting). You may have this done every 1-3 years. Bone density scan. This is done to screen for osteoporosis. You may have this done starting at age 67. Mammogram. This may be done every 1-2 years. Talk to your health care provider about how often you should have regular mammograms. Talk with your health care provider about your test results, treatment options, and if necessary, the need for more tests. Vaccines  Your health care provider may recommend certain vaccines, such as: Influenza vaccine. This is recommended every year. Tetanus, diphtheria, and acellular pertussis (Tdap, Td) vaccine. You may need a Td booster every 10 years. Zoster vaccine. You may need this after age 56. Pneumococcal 13-valent conjugate (PCV13) vaccine. One dose is recommended after age 37. Pneumococcal polysaccharide (PPSV23) vaccine. One dose is recommended after age 44. Talk to your health care provider about which screenings and vaccines you need and how often you need them. This information is not intended to replace advice given to you by your health care provider. Make sure you discuss any questions you have with your health care provider. Document Released: 10/24/2015 Document Revised: 06/16/2016 Document Reviewed: 07/29/2015 Elsevier Interactive Patient Education  2017 [place] Prevention in  the Home Falls can cause injuries. They can happen to people of all ages. There are many things you can do to make your home safe and to help prevent falls. What can I do on the outside of my home? Regularly fix the edges of walkways and driveways and fix any cracks. Remove anything that might make you trip as you walk through a door, such as a raised step or threshold. Trim any bushes or trees on the path to your home. Use bright outdoor lighting. Clear any walking paths of anything that might make someone trip, such as rocks or tools. Regularly check to see if handrails are loose or broken. Make sure that both sides of any steps have handrails. Any raised decks and porches should have guardrails on  the edges. Have any leaves, snow, or ice cleared regularly. Use sand or salt on walking paths during winter. Clean up any spills in your garage right away. This includes oil or grease spills. What can I do in the bathroom? Use night lights. Install grab bars by the toilet and in the tub and shower. Do not use towel bars as grab bars. Use non-skid mats or decals in the tub or shower. If you need to sit down in the shower, use a plastic, non-slip stool. Keep the floor dry. Clean up any water that spills on the floor as soon as it happens. Remove soap buildup in the tub or shower regularly. Attach bath mats securely with double-sided non-slip rug tape. Do not have throw rugs and other things on the floor that can make you trip. What can I do in the bedroom? Use night lights. Make sure that you have a light by your bed that is easy to reach. Do not use any sheets or blankets that are too big for your bed. They should not hang down onto the floor. Have a firm chair that has side arms. You can use this for support while you get dressed. Do not have throw rugs and other things on the floor that can make you trip. What can I do in the kitchen? Clean up any spills right away. Avoid walking on wet  floors. Keep items that you use a lot in easy-to-reach places. If you need to reach something above you, use a strong step stool that has a grab bar. Keep electrical cords out of the way. Do not use floor polish or wax that makes floors slippery. If you must use wax, use non-skid floor wax. Do not have throw rugs and other things on the floor that can make you trip. What can I do with my stairs? Do not leave any items on the stairs. Make sure that there are handrails on both sides of the stairs and use them. Fix handrails that are broken or loose. Make sure that handrails are as long as the stairways. Check any carpeting to make sure that it is firmly attached to the stairs. Fix any carpet that is loose or worn. Avoid having throw rugs at the top or bottom of the stairs. If you do have throw rugs, attach them to the floor with carpet tape. Make sure that you have a light switch at the top of the stairs and the bottom of the stairs. If you do not have them, ask someone to add them for you. What else can I do to help prevent falls? Wear shoes that: Do not have high heels. Have rubber bottoms. Are comfortable and fit you well. Are closed at the toe. Do not wear sandals. If you use a stepladder: Make sure that it is fully opened. Do not climb a closed stepladder. Make sure that both sides of the stepladder are locked into place. Ask someone to hold it for you, if possible. Clearly mark and make sure that you can see: Any grab bars or handrails. First and last steps. Where the edge of each step is. Use tools that help you move around (mobility aids) if they are needed. These include: Canes. Walkers. Scooters. Crutches. Turn on the lights when you go into a dark area. Replace any light bulbs as soon as they burn out. Set up your furniture so you have a clear path. Avoid moving your furniture around. If any of your floors are uneven,  fix them. If there are any pets around you, be aware of  where they are. Review your medicines with your doctor. Some medicines can make you feel dizzy. This can increase your chance of falling. Ask your doctor what other things that you can do to help prevent falls. This information is not intended to replace advice given to you by your health care provider. Make sure you discuss any questions you have with your health care provider. Document Released: 07/24/2009 Document Revised: 03/04/2016 Document Reviewed: 11/01/2014 Elsevier Interactive Patient Education  2017 [employer].

## 2022-12-16 NOTE — Progress Notes (Signed)
I connected with  Judy [name] on 12/16/22 by a audio enabled telemedicine application and verified that I am speaking with the correct person using two identifiers.  Patient Location: Home  Provider Location: Office/Clinic  I discussed the limitations of evaluation and management by telemedicine. The patient expressed understanding and agreed to proceed.  Subjective:   Judy [name] is a 77 y.o. female who presents for Medicare Annual (Subsequent) preventive examination.  Review of Systems     Cardiac Risk Factors include: advanced age (>67mn, >>58women);dyslipidemia;hypertension     Objective:    Today's Vitals   12/16/22 1444  Weight: 155 lb (70.3 kg)  Height: '5\' 9"'$  (1.753 m)   Body mass index is 22.89 kg/m.     12/16/2022    2:48 PM 12/03/2021    4:03 PM 11/20/2020    2:04 PM 11/15/2019    4:14 PM 11/06/2019    2:30 PM 09/18/2019    2:09 PM 07/31/2019    9:55 AM  Advanced Directives  Does Patient Have a Medical Advance Directive? Yes Yes Yes No No Yes No  Type of AP[place]amed[place]of [place] will [place] will [place] of facility DNR (pink MOST or yellow form)   [place] Chart? Yes - validated most recent copy scanned in chart (See row information) Yes - validated most recent copy scanned in chart (See row information) Yes - validated most recent copy scanned in chart (See row information)   Yes - validated most recent copy scanned in chart (See row information)   Would patient like information on creating a medical advance directive?    No - Patient declined Yes (ED - Information included in AVS)  Yes (MAU/Ambulatory/Procedural Areas - Information given)    Current Medications (verified) Outpatient Encounter Medications as of 12/16/2022  Medication Sig   acetaminophen (TYLENOL) 325 MG tablet Take 2 tablets (650 mg total) by mouth  every 6 (six) hours as needed for mild pain (or Fever >/= 101).   albuterol (VENTOLIN HFA) 108 (90 Base) MCG/ACT inhaler Inhale 2 puffs into the lungs every 6 (six) hours as needed for wheezing or shortness of breath. Use with spacer   atorvastatin (LIPITOR) 10 MG tablet Take 1 tablet (10 mg total) by mouth daily.   b complex vitamins capsule Take 1 capsule by mouth daily.   buPROPion (WELLBUTRIN XL) 150 MG 24 hr tablet TAKE 1 TABLET BY MOUTH EVERY DAY   cholecalciferol (VITAMIN D3) 25 MCG (1000 UNIT) tablet Take 1,000 Units by mouth daily.   INGREZZA 80 MG capsule Take 1 capsule (80 mg total) by mouth daily.   meclizine (ANTIVERT) 12.5 MG tablet Take 1 tablet (12.5 mg total) by mouth 3 (three) times daily as needed for dizziness.   mirtazapine (REMERON) 7.5 MG tablet TAKE 0.5-1 TABLETS (3.75-7.5 MG TOTAL) BY MOUTH AT BEDTIME AS NEEDED.   Multiple Vitamin (MULTIVITAMIN) capsule Take 1 capsule by mouth daily.   Omega-3 Fatty Acids (FISH OIL BURP-LESS PO) Take by mouth.   polyethylene glycol (MIRALAX / GLYCOLAX) 17 g packet Take 17 g by mouth daily.   propranolol (INDERAL) 10 MG tablet Take 1 tablet (10 mg total) by mouth 3 (three) times daily as needed.   amoxicillin (AMOXIL) 500 MG tablet Take 1 tablet (500 mg total) by mouth 2 (two) times daily. (Patient not taking: Reported on 12/16/2022)   No facility-administered encounter  medications on file as of 12/16/2022.    Allergies (verified) Patient has no known allergies.   History: Past Medical History:  Diagnosis Date   Allergic rhinitis    Anemia    Anxiety    Arthritis    Bipolar 1 disorder (HCC)    Depression    Diverticulosis of colon (without mention of hemorrhage) 08/25/2011   [name]   Hearing loss in left ear    since birth   Hyperlipidemia    Hypertension    Insomnia    Obesity    Renal disorder    Tardive dyskinesia    Tremor    Vertigo    Past Surgical History:  Procedure Laterality Date   COLONOSCOPY  08/25/2011    Procedure: COLONOSCOPY;  Surgeon: Landry Dyke, MD;  Location: [facility];  Service: Endoscopy;  Laterality: N/A;   DILATION AND CURETTAGE OF UTERUS  75's   Family History  Problem Relation Age of Onset   Prostate cancer Brother    Hypertension Brother    Kidney disease Brother    Colon cancer Brother    Hypertension Mother    Kidney disease Mother    Stomach cancer Father    Hyperlipidemia Sister    Hypothyroidism Sister    Stroke Brother    Prostate cancer Brother    Social History   Socioeconomic History   Marital status: Single    Spouse name: Not on file   Number of children: 1   Years of education: 12   Highest education level: Not on file  Occupational History   Occupation: Retired  Tobacco Use   Smoking status: Never   Smokeless tobacco: Never  Scientific laboratory technician Use: Never used  Substance and Sexual Activity   Alcohol use: Never   Drug use: Never   Sexual activity: Not Currently  Other Topics Concern   Not on file  Social History Narrative   Lives alone in house, does not need assist device.  Works part time at [employer]. Has a son.     Cousin and sister in close proximity   Right handed   2 cups coffee per day   Social Determinants of Health   Financial Resource Strain: Low Risk  (12/16/2022)   Overall Financial Resource Strain (CARDIA)    Difficulty of Paying Living Expenses: Not hard at all  Food Insecurity: No Food Insecurity (12/16/2022)   Hunger Vital Sign    Worried About Running Out of Food in the Last Year: Never true    Ran Out of Food in the Last Year: Never true  Transportation Needs: No Transportation Needs (12/16/2022)   PRAPARE - Hydrologist (Medical): No    Lack of Transportation (Non-Medical): No  Physical Activity: Insufficiently Active (12/16/2022)   Exercise Vital Sign    Days of Exercise per Week: 3 days    Minutes of Exercise per Session: 20 min  Stress: Stress Concern Present  (12/16/2022)   [place]    Feeling of Stress : To some extent  Social Connections: Not on file    Tobacco Counseling Counseling given: Not Answered   Clinical Intake:  Pre-visit preparation completed: Yes  Pain : No/denies pain     Nutritional Risks: None Diabetes: No  How often do you need to have someone help you when you read instructions, pamphlets, or other written materials from your doctor or pharmacy?:  1 - Never  Diabetic? no  Interpreter Needed?: No  Information entered by :: NAllen LPN   Activities of Daily Living    12/16/2022    2:50 PM 04/21/2022   12:27 PM  In your present state of health, do you have any difficulty performing the following activities:  Hearing? 1   Comment needs hearing aids   Vision? 0   Difficulty concentrating or making decisions? 1   Comment sometimes   Walking or climbing stairs? 1   Dressing or bathing? 0   Doing errands, shopping? 1 1  Preparing Food and eating ? N   Using the Toilet? N   In the past six months, have you accidently leaked urine? Y   Comment wears depends   Do you have problems with loss of bowel control? N   Managing your Medications? N   Managing your Finances? N   Housekeeping or managing your Housekeeping? N     Patient Care Team: [name], FNP as PCP - General (General Practice) Kristeen Miss, MD as Consulting Physician (Neurosurgery) Cottle, Billey Co., MD as Attending Physician (Psychiatry) Mayford Knife, [facility] (Pharmacist) Rex Kras Claudette Stapler, RN as Martorell any recent Medical Services you may have received from other than [facility] providers in the past year (date may be approximate).     Assessment:   This is a routine wellness examination for Judy [name].  Hearing/Vision screen Vision Screening - Comments:: No regular eye exams  Dietary issues and exercise activities  discussed: Current Exercise Habits: Home exercise routine, Type of exercise: walking, Time (Minutes): 20, Frequency (Times/Week): 3, Weekly Exercise (Minutes/Week): 60   Goals Addressed             This Visit's Progress    Patient Stated       12/16/2022, wants to get right hand stronger       Depression Screen    12/16/2022    2:50 PM 11/04/2022    2:19 PM 06/22/2022    3:58 PM 05/31/2022    9:03 AM 03/02/2022    3:19 PM 12/03/2021    4:04 PM 04/09/2021    5:09 PM  PHQ 2/9 Scores  PHQ - 2 Score 0 '2 3 6 2 '$ 0 2  PHQ- 9 Score '2 12 17 23 6  3    '$ Fall Risk    12/16/2022    2:49 PM 11/04/2022    2:14 PM 05/31/2022    8:54 AM 12/03/2021    4:03 PM 11/20/2020    2:04 PM  Fall Risk   Falls in the past year? '1 1 1 '$ 0 0  Comment don't know      Number falls in past yr: 0 0 1    Injury with Fall? '1 1 1    '$ Risk for fall due to : Impaired balance/gait;Impaired mobility;Medication side effect History of fall(s) History of fall(s) Impaired balance/gait;Impaired mobility;Medication side effect Impaired mobility;Medication side effect;Impaired balance/gait  Follow up Falls prevention discussed;Education provided;Falls evaluation completed Falls evaluation completed Falls evaluation completed Falls evaluation completed;Education provided;Falls prevention discussed Falls evaluation completed;Education provided;Falls prevention discussed    FALL RISK PREVENTION PERTAINING TO THE HOME:  Any stairs in or around the home? No  If so, are there any without handrails? N/a Home free of loose throw rugs in walkways, pet beds, electrical cords, etc? Yes  Adequate lighting in your home to reduce risk of falls? Yes   ASSISTIVE DEVICES UTILIZED TO PREVENT FALLS:  Life alert? No  Use of a cane, walker or w/c? Yes  Grab bars in the bathroom? Yes  Shower chair or bench in shower? Yes  Elevated toilet seat or a handicapped toilet? Yes   TIMED UP AND GO:  Was the test performed? No .      Cognitive  Function:    07/06/2017    1:30 PM 03/25/2017    3:07 PM 06/04/2014    2:26 PM  MMSE - Mini Mental State Exam  Orientation to time '5 5 5  '$ Orientation to Place '5 5 5  '$ Registration '3 3 3  '$ Attention/ Calculation '4 4 5  '$ Recall '1 2 2  '$ Language- name 2 objects '2 2 2  '$ Language- repeat '1 1 1  '$ Language- follow 3 step command '3 3 3  '$ Language- read & follow direction '1 1 1  '$ Write a sentence '1 1 1  '$ Copy design 1 0 0  Total score '27 27 28        '$ 12/16/2022    2:52 PM 12/03/2021    4:08 PM 11/20/2020    2:07 PM 09/18/2019    2:16 PM  6CIT Screen  What Year? 0 points 0 points 0 points 0 points  What month? 0 points 0 points 0 points 0 points  What time? 0 points 0 points 0 points 0 points  Count back from 20 4 points 0 points 0 points 0 points  Months in reverse 4 points 4 points 4 points 0 points  Repeat phrase 2 points 10 points 4 points 0 points  Total Score 10 points 14 points 8 points 0 points    Immunizations Immunization History  Administered Date(s) Administered   Fluad Quad(high Dose 65+) 06/28/2022   Influenza, High Dose Seasonal PF 07/24/2018, 07/02/2019   Influenza,inj,Quad PF,6+ Mos 12/11/2013, 09/12/2015, 07/20/2016, 08/10/2017   Influenza-Unspecified 08/04/2021   PFIZER(Purple Top)SARS-COV-2 Vaccination 12/24/2019, 01/15/2020, 07/14/2020, 10/09/2020   Pneumococcal Polysaccharide-23 12/11/2013, 11/08/2019   Zoster Recombinat (Shingrix) 03/02/2022, 08/10/2022    TDAP status: Due, Education has been provided regarding the importance of this vaccine. Advised may receive this vaccine at local pharmacy or Health Dept. Aware to provide a copy of the vaccination record if obtained from local pharmacy or Health Dept. Verbalized acceptance and understanding.  Flu Vaccine status: Up to date  Pneumococcal vaccine status: Up to date  Covid-19 vaccine status: Completed vaccines  Qualifies for Shingles Vaccine? Yes   Zostavax completed Yes   Shingrix Completed?: Yes  Screening  Tests Health Maintenance  Topic Date Due   DTaP/Tdap/Td (1 - Tdap) Never done   Pneumonia Vaccine 60+ Years old (3 of 3 - PCV) 11/07/2020   COLONOSCOPY (Pts 45-12yr Insurance coverage will need to be confirmed)  08/24/2021   COVID-19 Vaccine (5 - 2023-24 season) 06/11/2022   Medicare Annual Wellness (AWV)  12/03/2022   INFLUENZA VACCINE  Completed   DEXA SCAN  Completed   Hepatitis C Screening  Completed   Zoster Vaccines- Shingrix  Completed   HPV VACCINES  Aged Out    Health Maintenance  Health Maintenance Due  Topic Date Due   DTaP/Tdap/Td (1 - Tdap) Never done   Pneumonia Vaccine 76 Years old (3 of 3 - PCV) 11/07/2020   COLONOSCOPY (Pts 45-426yrInsurance coverage will need to be confirmed)  08/24/2021   COVID-19 Vaccine (5 - 2023-24 season) 06/11/2022   Medicare Annual Wellness (AWV)  12/03/2022    Colorectal cancer screening: scheduled for later this year  Mammogram status: scheduled  for later this year  Bone Density status: due  Lung Cancer Screening: (Low Dose CT Chest recommended if Age 38-80 years, 30 pack-year currently smoking OR have quit w/in 15years.) does not qualify.   Lung Cancer Screening Referral: no  Additional Screening:  Hepatitis C Screening: does qualify; Completed 10/29/2021  Vision Screening: Recommended annual ophthalmology exams for early detection of glaucoma and other disorders of the eye. Is the patient up to date with their annual eye exam?  No  Who is the provider or what is the name of the office in which the patient attends annual eye exams? none If pt is not established with a provider, would they like to be referred to a provider to establish care? No .   Dental Screening: Recommended annual dental exams for proper oral hygiene  Community Resource Referral / Chronic Care Management: CRR required this visit?  No   CCM required this visit?  No      Plan:     I have personally reviewed and noted the following in the  patient's chart:   Medical and social history Use of alcohol, tobacco or illicit drugs  Current medications and supplements including opioid prescriptions. Patient is not currently taking opioid prescriptions. Functional ability and status Nutritional status Physical activity Advanced directives List of other physicians Hospitalizations, surgeries, and ER visits in previous 12 months Vitals Screenings to include cognitive, depression, and falls Referrals and appointments  In addition, I have reviewed and discussed with patient certain preventive protocols, quality metrics, and best practice recommendations. A written personalized care plan for preventive services as well as general preventive health recommendations were provided to patient.     Kellie Simmering, LPN   [id number]   Nurse Notes: none  Due to this being a virtual visit, the after visit summary with patients personalized plan was offered to patient via mail or my-chart. to pick up at office at next visit

## 2022-12-24 ENCOUNTER — Encounter: Payer: Self-pay | Admitting: Pulmonary Disease

## 2022-12-24 ENCOUNTER — Ambulatory Visit: Payer: Medicare Other | Admitting: Pulmonary Disease

## 2022-12-24 VITALS — BP 128/68 | HR 66 | Ht 69.0 in | Wt 146.2 lb

## 2022-12-24 DIAGNOSIS — R0609 Other forms of dyspnea: Secondary | ICD-10-CM | POA: Diagnosis not present

## 2022-12-24 MED ORDER — IPRATROPIUM-ALBUTEROL 0.5-2.5 (3) MG/3ML IN SOLN: RESPIRATORY_TRACT | 3 refills | Status: DC

## 2022-12-24 NOTE — Progress Notes (Signed)
@Patient  ID: Judy [name], female    DOB: 1946/05/09, 77 y.o.   MRN: [id number]  Chief Complaint  Patient presents with   Consult    Pt is here for consult for SOB. Pt states that it has been occurring for about 5 months now. Pt had CT of Chest done last October. Pt is on ventolin as needed     Referring provider: Garnette Scheuermann, PA*  HPI:   77 y.o. woman whom we are seeing for evaluation of dyspnea on exertion.  Note from referring provider reviewed.  Most recent pulmonary note 2018 reviewed.  Difficulty a bit difficult to obtain from patient.  Unclear if just due to being hard of hearing or other cognitive issue.  History gathered from patient as well as brother, sister-in-law in the room.  State dyspnea on exertion over the last 5 to 6 months.  No clear inciting event.  Denies any respiratory illness etc.  No time of day when things are better or worse.  No position that make things better or worse.  No seasonal or environmental factors she can do the 5 to make things better or worse.  She was given a prescription for albuterol.  Says it helps sometimes.  Not all the time.  Symptoms seem pretty stable day-to-day.  Largely unchanged over time.  Most recent chest CT, in the midst of the symptoms, 07/2022 reviewed and interpreted as clear lungs bilaterally, stable less than 1 cm groundglass opacity/nodule in the right upper lobe.  She was seen by pulmonary in 2018.  PFTs difficult to perform but no fixed obstruction.  Felt he related to deconditioning, habitus at the time.  Chest imaging clear at that time.  Remains clear.  Discussed at length with clear chest imaging unlikely to have significant pulmonary abnormality.  Asthma is possible does not show up well on imaging nor pulmonary function test.  Discussed role and rationale for empiric treatment as well as additional investigation which they expressed understanding and agreed to.   Questionaires / Pulmonary Flowsheets:   ACT:       No data to display          MMRC:     No data to display          Epworth:      No data to display          Tests:   FENO:  No results found for: "NITRICOXIDE"  PFT:    Latest Ref Rng & Units 04/15/2017    2:00 PM  PFT Results  FVC-Pre L 1.87   FVC-Predicted Pre % 63   Pre FEV1/FVC % % 81   FEV1-Pre L 1.52   FEV1-Predicted Pre % 65   Personally reviewed, not fully interpretable, only spirometry, no fixed obstruction  WALK:     06/09/2017    3:43 PM 04/15/2017    4:47 PM  SIX MIN WALK  Supplimental Oxygen during Test? (L/min) No   Tech Comments: slow to normal pace/leg pain and SOB//lmr normal pace/SOB//lmr    Imaging: Personally reviewed and as per EMR discussion this note No results found.  Lab Results: Personally reviewed CBC    Component Value Date/Time   WBC 5.6 11/24/2022 1233   WBC 5.1 04/28/2022 0437   RBC 4.84 11/24/2022 1233   RBC 4.04 04/28/2022 0437   HGB 14.6 11/24/2022 1233   HCT 45.5 11/24/2022 1233   PLT 263 11/24/2022 1233   MCV 94 11/24/2022 1233  MCH 30.2 11/24/2022 1233   MCH 30.2 04/28/2022 0437   MCHC 32.1 11/24/2022 1233   MCHC 33.2 04/28/2022 0437   RDW 13.5 11/24/2022 1233   LYMPHSABS 1.9 04/28/2022 0437   LYMPHSABS 1.5 07/10/2018 1220   MONOABS 0.6 04/28/2022 0437   EOSABS 0.2 04/28/2022 0437   EOSABS 0.0 07/10/2018 1220   BASOSABS 0.0 04/28/2022 0437   BASOSABS 0.0 07/10/2018 1220    BMET    Component Value Date/Time   NA 141 11/04/2022 1514   K 4.2 11/04/2022 1514   CL 104 11/04/2022 1514   CO2 23 11/04/2022 1514   GLUCOSE 88 11/04/2022 1514   GLUCOSE 87 04/28/2022 0437   BUN 12 11/04/2022 1514   CREATININE 1.02 (H) 11/04/2022 1514   CREATININE 1.16 (H) 03/21/2018 1515   CALCIUM 9.9 11/04/2022 1514   GFRNONAA >60 04/28/2022 0437   GFRNONAA 48 (L) 03/21/2018 1515   GFRAA >60 11/20/2019 0314   GFRAA 55 (L) 03/21/2018 1515    BNP    Component Value Date/Time   BNP 14.6 03/02/2022 1539    BNP 41.6 11/15/2019 1716    ProBNP    Component Value Date/Time   PROBNP 23.0 04/15/2017 1703    Specialty Problems       Pulmonary Problems   ALLERGIC RHINITIS    Qualifier: Diagnosis of  By: Amil Amen MD, Elizabeth        URI (upper respiratory infection)   Dyspnea on exertion    - Spirometry 04/15/2017  FEV1 1.78 (76%)  Ratio 78 s curvature p no rx   04/15/2017   Walked RA  2 laps @ 185 ft each stopped due to  Sob/ no desats/ nl pace 06/09/2017   Walked RA  2 laps @ 185 ft each stopped due to  Weak legs > sob/ slow pace, no desat         Solitary pulmonary nodule on lung CT    See ct 04/08/17 x 6 mm RUL/ 3 mm LUL        No Known Allergies  Immunization History  Administered Date(s) Administered   Fluad Quad(high Dose 65+) 06/28/2022   Influenza, High Dose Seasonal PF 07/24/2018, 07/02/2019   Influenza,inj,Quad PF,6+ Mos 12/11/2013, 09/12/2015, 07/20/2016, 08/10/2017   Influenza-Unspecified 08/04/2021   PFIZER(Purple Top)SARS-COV-2 Vaccination 12/24/2019, 01/15/2020, 07/14/2020, 10/09/2020   Pneumococcal Polysaccharide-23 12/11/2013, 11/08/2019   Zoster Recombinat (Shingrix) 03/02/2022, 08/10/2022    Past Medical History:  Diagnosis Date   Allergic rhinitis    Anemia    Anxiety    Arthritis    Bipolar 1 disorder ([place])    Depression    Diverticulosis of colon (without mention of hemorrhage) 08/25/2011   [name]   Hearing loss in left ear    since birth   Hyperlipidemia    Hypertension    Insomnia    Obesity    Renal disorder    Tardive dyskinesia    Tremor    Vertigo     Tobacco History: Social History   Tobacco Use  Smoking Status Never  Smokeless Tobacco Never   Counseling given: Not Answered   Continue to not smoke  Outpatient Encounter Medications as of 12/24/2022  Medication Sig   acetaminophen (TYLENOL) 325 MG tablet Take 2 tablets (650 mg total) by mouth every 6 (six) hours as needed for mild pain (or Fever >/= 101).   albuterol  (VENTOLIN HFA) 108 (90 Base) MCG/ACT inhaler Inhale 2 puffs into the lungs every 6 (six) hours as needed  for wheezing or shortness of breath. Use with spacer   amoxicillin (AMOXIL) 500 MG tablet Take 1 tablet (500 mg total) by mouth 2 (two) times daily.   atorvastatin (LIPITOR) 10 MG tablet Take 1 tablet (10 mg total) by mouth daily.   b complex vitamins capsule Take 1 capsule by mouth daily.   buPROPion (WELLBUTRIN XL) 150 MG 24 hr tablet TAKE 1 TABLET BY MOUTH EVERY DAY   cholecalciferol (VITAMIN D3) 25 MCG (1000 UNIT) tablet Take 1,000 Units by mouth daily.   INGREZZA 80 MG capsule Take 1 capsule (80 mg total) by mouth daily.   ipratropium-albuterol (DUONEB) 0.5-2.5 (3) MG/3ML SOLN 1 treatment in the morning, 1 treatment in the evenings, every 6 hours as needed in between for shortness of breath or wheezing   meclizine (ANTIVERT) 12.5 MG tablet Take 1 tablet (12.5 mg total) by mouth 3 (three) times daily as needed for dizziness.   mirtazapine (REMERON) 7.5 MG tablet TAKE 0.5-1 TABLETS (3.75-7.5 MG TOTAL) BY MOUTH AT BEDTIME AS NEEDED.   Multiple Vitamin (MULTIVITAMIN) capsule Take 1 capsule by mouth daily.   Omega-3 Fatty Acids (FISH OIL BURP-LESS PO) Take by mouth.   polyethylene glycol (MIRALAX / GLYCOLAX) 17 g packet Take 17 g by mouth daily.   propranolol (INDERAL) 10 MG tablet Take 1 tablet (10 mg total) by mouth 3 (three) times daily as needed.   No facility-administered encounter medications on file as of 12/24/2022.     Review of Systems  Review of Systems  No chest pain with exertion.  No orthopnea or PND.  Comprehensive review of systems otherwise negative. Physical Exam  BP 128/68 (BP Location: Left Arm, Patient Position: Sitting, Cuff Size: Normal)   Pulse 66   Ht 5\' 9"  (1.753 m)   Wt 146 lb 3.2 oz (66.3 kg)   SpO2 98%   BMI 21.59 kg/m   Wt Readings from Last 5 Encounters:  12/24/22 146 lb 3.2 oz (66.3 kg)  12/16/22 155 lb (70.3 kg)  11/24/22 150 lb (68 kg)   11/08/22 147 lb (66.7 kg)  11/04/22 151 lb 12.8 oz (68.9 kg)    BMI Readings from Last 5 Encounters:  12/24/22 21.59 kg/m  12/16/22 22.89 kg/m  11/24/22 22.15 kg/m  11/08/22 21.40 kg/m  11/04/22 22.10 kg/m     Physical Exam General: Sitting in chair, no acute distress Eyes: EOMI, no icterus Neck: Supple, no JVP Pulmonary: Clear, no work of breathing Cardiovascular: Warm, no edema Abdomen: Distended, bowel sounds present MSK: No synovitis, no joint effusion Neuro: Normal gait, no weakness, hard of hearing Psych: normal mood, full affect   Assessment & Plan:   Dyspnea on exertion: Present for a few months but review of records indicates seen in 2018 in pulmonary clinic for the same.  PFTs hard to complete.  But felt that time related to habitus, deconditioning.  Her chest imaging historically and recently are clear, unlikely be a pulmonary issue.  PFTs for further evaluation.  Possible development of asthma, trial bronchodilators via DuoNebs, unable to coordinate use of albuterol/HFA consistently.  If helpful, can consider long-acting agents in the future.  If not, no need to continue.  Lung nodule: GGO being surveilled by PCP, first noted fall 2019, stable over the last 4 years.  1 year follow-up 07/2023 recommended and then definitively benign, further follow-up per PCP.  Return in about 3 months (around 03/26/2023).   Lanier Clam, MD 12/24/2022   This appointment required 61 minutes of patient  care (this includes precharting, chart review, review of results, face-to-face care, etc.).

## 2022-12-24 NOTE — Patient Instructions (Addendum)
Nice to meet you  Lets try to use some medicine to be nebulizer to see if it helps your breathing  Use DuoNebs once in the morning and once in the evening, you can use in between as needed  He will send a order for a nebulizer machine to be delivered to your house  Try it for a week or 2 scheduled, if your breathing improves you can back off to using as needed  If you develop side effects like jitteriness etc., back off to use as needed and see me a message  Will get pulmonary function test to further evaluate your shortness of breath  Return to clinic in 3 months or sooner as needed with Dr. Silas Flood PFT next available

## 2022-12-27 DIAGNOSIS — R0609 Other forms of dyspnea: Secondary | ICD-10-CM | POA: Diagnosis not present

## 2022-12-27 DIAGNOSIS — J449 Chronic obstructive pulmonary disease, unspecified: Secondary | ICD-10-CM | POA: Diagnosis not present

## 2023-01-02 DIAGNOSIS — G8191 Hemiplegia, unspecified affecting right dominant side: Secondary | ICD-10-CM | POA: Diagnosis not present

## 2023-01-02 DIAGNOSIS — M6281 Muscle weakness (generalized): Secondary | ICD-10-CM | POA: Diagnosis not present

## 2023-01-02 DIAGNOSIS — R55 Syncope and collapse: Secondary | ICD-10-CM | POA: Diagnosis not present

## 2023-01-02 DIAGNOSIS — R262 Difficulty in walking, not elsewhere classified: Secondary | ICD-10-CM | POA: Diagnosis not present

## 2023-01-02 DIAGNOSIS — M6259 Muscle wasting and atrophy, not elsewhere classified, multiple sites: Secondary | ICD-10-CM | POA: Diagnosis not present

## 2023-01-12 ENCOUNTER — Encounter: Payer: Self-pay | Admitting: Obstetrics & Gynecology

## 2023-01-12 ENCOUNTER — Other Ambulatory Visit: Payer: Self-pay

## 2023-01-12 ENCOUNTER — Other Ambulatory Visit (HOSPITAL_COMMUNITY)
Admission: RE | Admit: 2023-01-12 | Discharge: 2023-01-12 | Disposition: A | Discharge status: Home / Self Care | Payer: Medicare Other | Source: Ambulatory Visit | Attending: Obstetrics & Gynecology | Admitting: Obstetrics & Gynecology

## 2023-01-12 ENCOUNTER — Ambulatory Visit (INDEPENDENT_AMBULATORY_CARE_PROVIDER_SITE_OTHER): Payer: Medicare Other | Admitting: Obstetrics & Gynecology

## 2023-01-12 VITALS — BP 113/63 | HR 89 | Wt 145.8 lb

## 2023-01-12 DIAGNOSIS — Z1151 Encounter for screening for human papillomavirus (HPV): Secondary | ICD-10-CM | POA: Diagnosis not present

## 2023-01-12 DIAGNOSIS — N950 Postmenopausal bleeding: Secondary | ICD-10-CM | POA: Diagnosis not present

## 2023-01-12 DIAGNOSIS — Z124 Encounter for screening for malignant neoplasm of cervix: Secondary | ICD-10-CM | POA: Insufficient documentation

## 2023-01-12 NOTE — Progress Notes (Signed)
GYNECOLOGY OFFICE VISIT NOTE  History:   Judy [name] is a 77 y.o. PMP female here today for evaluation of postmenopausal bleeding (PMB).  Had episode in February and then started again yesterday. No abdominal pain.  Patient is accompanied by her sister-in-law who helped with the history.  She denies any other concerns.    Past Medical History:  Diagnosis Date   Allergic rhinitis    Anemia    Anxiety    Arthritis    Bipolar 1 disorder    Depression    Diverticulosis of colon (without mention of hemorrhage) 08/25/2011   [name]   Hearing loss in left ear    since birth   Hyperlipidemia    Hypertension    Insomnia    Obesity    Renal disorder    Tardive dyskinesia    Tremor    Vertigo     Past Surgical History:  Procedure Laterality Date   COLONOSCOPY  08/25/2011   Procedure: COLONOSCOPY;  Surgeon: Landry Dyke, MD;  Location: [facility];  Service: Endoscopy;  Laterality: N/A;   DILATION AND CURETTAGE OF UTERUS  1960's   The following portions of the patient's history were reviewed and updated as appropriate: allergies, current medications, past family history, past medical history, past social history, past surgical history and problem list.    Review of Systems:  Pertinent items noted in HPI and remainder of comprehensive ROS otherwise negative.  Physical Exam:  BP 113/63   Pulse 89   Wt 145 lb 12.8 oz (66.1 kg)   BMI 21.53 kg/m  CONSTITUTIONAL: Well-developed, well-nourished female in no acute distress.  HEENT:  Normocephalic, atraumatic. External right and left ear normal. No scleral icterus.  NECK: Normal range of motion, supple, no masses noted on observation SKIN: No rash noted. Not diaphoretic. No erythema. No pallor. MUSCULOSKELETAL: Normal range of motion. No edema noted. NEUROLOGIC: Alert and oriented to person, place, and time. Normal muscle tone coordination. No cranial nerve deficit noted. PSYCHIATRIC: Normal mood and affect. Normal  behavior. Normal judgment and thought content. CARDIOVASCULAR: Normal heart rate noted RESPIRATORY: Effort and breath sounds normal, no problems with respiration noted ABDOMEN: No masses noted. No other overt distention noted.   PELVIC: Normal appearing external genitalia; normal urethral meatus; atrophic appearing vaginal mucosa and cervix.  Slow trickle of blood from pinpoint area of cervix, cervical stenosis noted.  Pap sone, unable to advance endocervical brush. Normal uterine size, no other palpable masses, no uterine or adnexal tenderness. Performed in the presence of a chaperone     Assessment and Plan:     1. PMB (postmenopausal bleeding) Discussed etiologies of postmenopausal bleeding, concern about precancerous/hyperplasia or cancerous etiology (5 to 10% percent of cases).  However, she was reassured that endometrial atrophy and endometrial polyps are the most common causes of postmenopausal bleeding.  Uterine bleeding in postmenopausal women is usually light and self-limited. Exclusion of cancer is the main objective; therefore, treatment is usually unnecessary once cancer has been excluded.  The primary goal in the diagnostic evaluation of postmenopausal women with uterine bleeding is to exclude malignancy; this can include endometrial biopsy and pelvic ultrasound.   Discussed need for endometrial sampling, she declined today. Will recommend premedication with misoprostol, and oral analgesia and paracervical block before office endometrial biopsy. Alternative would be Hysteroscopy, D&C in OR.  Will follow up pap and ultrasound results, patient will be contacted with results and will make plans for further evaluation. - US PELVIC COMPLETE  WITH TRANSVAGINAL; Future - Cytology - PAP( [facility])   Routine preventative health maintenance measures emphasized. Please refer to After Visit Summary for other counseling recommendations.   Return for After ultrasound, discuss results, posisble  endometrial biopsy.    I spent 45 minutes dedicated to the care of this patient including pre-visit review of records, face to face time with the patient discussing her conditions and treatments and post visit orders.    Verita Schneiders, MD, [place] for [employer], [place]

## 2023-01-12 NOTE — Patient Instructions (Addendum)
Endometrial Biopsy is done in the office, can give some local numbing medicine and have patient take medication before coming.   Alternative, is Hysteroscopy, Dilation and Curettahge in the operating room under anesthesia   Patient's cervix is relatively closed. Will recommend Misoprostol to be placed vaginally the night before to soften cervix prior to biopsy.

## 2023-01-17 ENCOUNTER — Telehealth: Payer: Self-pay | Admitting: *Deleted

## 2023-01-17 NOTE — Telephone Encounter (Signed)
Received a voicemail from this afternoon from a female stating she is  Judy [name]'s sister- in - law and that Judy [name] was seen last week for PMB. She states she is still bleeding and in fact is bleeding more. States they want to know what to do for her. States she has [place] 01/21/23 and next doctor visit 02/09/23. States you can call Judy [name] but must speak loudly as she is [facility] or you can call her back at [phone]. Nancy Fetter

## 2023-01-18 NOTE — Telephone Encounter (Signed)
Called pt's sister in law and pt's brother on the line.  The sister n law states that hey have some concerns about the upcoming ultrasound as the pt has some difficulty with the pelvic exam and how she will tolerate the upcoming ultrasound.  I explained to them that the U/S will require a vaginal probe and I also explained to them that the sonographer are pretty gentle with insertion.  I asked if they felt she needed anything for the exam.  The both were uncertain if she would need anything.  I advised them that she can go to the U/S and see how she does as it is important to find out what is going on and if she needs anything to calm her I can reach out to the provider to see if there is anything that we can prescribe.  I reviewed pt's medications as she is taking something to calm her.  I advised them that I would reach out to the provider to see

## 2023-01-19 LAB — CYTOLOGY - PAP
Comment: NEGATIVE
High risk HPV: NEGATIVE

## 2023-01-19 NOTE — Progress Notes (Signed)
Patient and her family (brother and sister-in-law) were called with this concerning pap smear concerning for possible endometrial cancer.  Talked about needing further evaluation with endometrial biopsy.  They are hesitant about in office procedure and do not feel she can tolerate this (had a rough time after the pap smear and was homebound for 4 days afterwards); they want to consider Hysteroscopy, Dilation and Curettage in OR.However, patient had some pulmonary issues during recent colonoscopy attempt, she will follow up with Pulmonologist for evaluation and see if she can undergo general anesthesia.  Need to confirm diagnosis emphasized.  Appointment scheduled in office on January 28, 2023 at 10:55 for further discussion and planning; they will contact pulmonologist prior to this appointment. Of note, patient has a pelvic ultrasound scheduled for 01/21/23, emphasized that she go for this appointment as scheduled.  Will follow up results and manage accordingly.   Jaynie Collins, MD, FACOG Obstetrician & Gynecologist, [facility] for [employer], [facility]

## 2023-01-20 ENCOUNTER — Other Ambulatory Visit: Payer: Medicare Other

## 2023-01-20 ENCOUNTER — Telehealth: Payer: Self-pay | Admitting: *Deleted

## 2023-01-20 NOTE — Telephone Encounter (Signed)
Received a voice message from a woman stating this is Sherilyn Cooter and Lyn Henri and are calling for [employer] . States they spoke with Dr. Macon Large last night and I promised I would call and give an update. Requests to let Dr. Macon Large know that they could not move up the scheduled appointment for [facility] for 01/28/23, they had no openings. States Dr. Judeth Horn is the physician who can make the decision if she can withstand anesthesia. States the staff at [facility] said they would talk with Dr. Macon Large concerning this if she would call them. Dr. Laurena Spies number is [phone]. May call her back as well.  Will route to Dr. Sharmon Leyden [facility]

## 2023-01-21 ENCOUNTER — Other Ambulatory Visit: Payer: Self-pay | Admitting: Nurse Practitioner

## 2023-01-21 ENCOUNTER — Ambulatory Visit (HOSPITAL_COMMUNITY)
Admission: RE | Admit: 2023-01-21 | Discharge: 2023-01-21 | Disposition: A | Discharge status: Home / Self Care | Payer: Medicare Other | Source: Ambulatory Visit | Attending: Obstetrics & Gynecology | Admitting: Obstetrics & Gynecology

## 2023-01-21 DIAGNOSIS — N950 Postmenopausal bleeding: Secondary | ICD-10-CM

## 2023-01-21 DIAGNOSIS — I700 Atherosclerosis of aorta: Secondary | ICD-10-CM

## 2023-01-21 NOTE — Telephone Encounter (Signed)
Is there any way this can be moved up in the day? It is unlikely I will do office endometrial biopsy, just want to discuss plan face to face with this family. They have another important appointment at 11 am with patient's pulmonologist.  Can be double booked at any earlier spot (it is already double booked at current spot).  Thank you!  Jaynie Collins, MD

## 2023-01-24 ENCOUNTER — Telehealth: Payer: Self-pay | Admitting: *Deleted

## 2023-01-24 DIAGNOSIS — N950 Postmenopausal bleeding: Secondary | ICD-10-CM

## 2023-01-24 DIAGNOSIS — N9489 Other specified conditions associated with female genital organs and menstrual cycle: Secondary | ICD-10-CM

## 2023-01-24 DIAGNOSIS — R87619 Unspecified abnormal cytological findings in specimens from cervix uteri: Secondary | ICD-10-CM

## 2023-01-24 NOTE — Telephone Encounter (Signed)
Received a voice message from [name] , states he is calling for his Sister Judy [name]. States she is a patient of Dr. Macon Large and they are calling for results. States from what they discussed with Dr. Macon Large they feel she needs to be seen sooner than May 1 due to the urgent nature. Would like Dr. Macon Large  or her nurse to call back. I called and informed them about appointment on 01/28/23. They said they have spoken with Dr. Macon Large and need to speak with her again. State she has to have pulmonary test before Dr. Judeth Horn will clear her for surgery and that is on 01/28/23. They wanted Dr. Macon Large to Call Dr. Judeth Horn also.  I will forward to Dr. Macon Large. Nancy Fetter

## 2023-01-24 NOTE — Telephone Encounter (Signed)
Per another message from Dr. Macon Large she has messaged with front office re: issue. Nancy Fetter

## 2023-01-25 DIAGNOSIS — N9489 Other specified conditions associated with female genital organs and menstrual cycle: Secondary | ICD-10-CM | POA: Insufficient documentation

## 2023-01-25 DIAGNOSIS — R87619 Unspecified abnormal cytological findings in specimens from cervix uteri: Secondary | ICD-10-CM | POA: Insufficient documentation

## 2023-01-25 NOTE — Telephone Encounter (Signed)
   Discussion Documentation  Discussed this patient with Dr. Eugene Garnet . Informed her that I was only able to do pap smear for workup of her postmenopausal bleeding during our 01/12/23 encounter, that pap showed atypical glandular cells, favor neoplastic -highly concerning for endometrial cancer. 01/21/23 ultrasound showed very abnormally thickened and irregular endometrium with a 4.2 cm vascular endometrial mass.  After her pap smear, her family said she was bed bound for several days due to the 'trauma' from it. She definitely will not be able to withstand office endometrial biopsy.  Of note, she was supposed to undergo colonoscopy on 12/08/22 and had some respiratory issues, she could not be sedated and procedure was aborted. She is seeing her pulmonologist and getting tests on 01/28/23.   I asked Dr. Pricilla Holm if a biopsy was truly needed or could be proceed non-invasive imaging such as CT scan, MRI or PET scan (worried about her being able to undergo PET or MRI scan, she will likely need sedation but has these respiratory issues).  If she is cleared by Pulmonary, I was thinking that if she needed to go undergo anesthesia, she can do that once for surgical management if that is what Dr. Pricilla Holm recommends for this unique situation.    Dr. Pricilla Holm recommends getting a CT scan of abdomen and pelvis with and without contrast to evaluate for possible metastatic disease.  Given that patient will not be able to undergo office biopsy, she thinks any management is going to involve sampling in the OR, likely done as a staged procedure with hysterectomy to follow if she is cleared by Pulmonology.  But we will follow up CT scan results and results from her pulmonary tests to decide the appropriate management.  I called discussed these recommendations with [name], the patient's brother who helps his sister in making healthcare decisions due to the patient's mental health impairments.  CT scan ordered, message sent  to office for this to be scheduled.  Mr [name] will be notified of this appointment.  We will follow up results and manage accordingly.  Appreciate Dr. Winferd Humphrey recommendations about the care of this patient.   Jaynie Collins, MD, FACOG Obstetrician & Gynecologist, [facility] for [employer], [facility]

## 2023-01-25 NOTE — Telephone Encounter (Signed)
     Faculty Practice OB/GYN Physician Phone Call Documentation  I had a phone conversation with Judy [name] and his wife, about his sister Judy [name] and her upcoming appointment on 01/28/23.  Patient has to undergo pulmonary function tests that day, they requested our appointment be moved back to 02/09/23.  This was done.  Also discussed results of pelvic ultrasound with them.  US PELVIC COMPLETE WITH TRANSVAGINAL  Result Date: 01/24/2023 CLINICAL DATA:  PMB EXAM: TRANSABDOMINAL AND TRANSVAGINAL ULTRASOUND OF PELVIS TECHNIQUE: Both transabdominal and transvaginal ultrasound examinations of the pelvis were performed. Transabdominal technique was performed for global imaging of the pelvis including uterus, ovaries, adnexal regions, and pelvic cul-de-sac. It was necessary to proceed with endovaginal exam following the transabdominal exam to visualize the RIGHT ovary, uterus and the endometrium. COMPARISON:  Ultrasound dated April 21, 2022. FINDINGS: Uterus Measurements: 7.5 by 4.4 x 7.0 cm = volume: 122 mL. Revisualization of several fibroids. Posterior favored subserosal fibroid measures approximately 3.8 by 3.1 x 4.3 cm. Additional favored subserosal fibroid of the LEFT uterus measures approximately 2.3 x 2.3 x 2.8 cm. Endometrium Thickness: 24 mm. The endometrium is irregular in appearance. There is a frond like contour appreciated adjacent to small amount of endometrial fluid. There is a focal masslike area with internal blood flow which spans approximately 3.6 x 2.6 x 4.2 cm. Right ovary Measurements: 2.8 x 2.5 x 3.8 cm = volume: 13.9 mL. There is a benign appearing cyst in the RIGHT ovary measuring 23 by 19 x 15 mm (for which no dedicated imaging follow-up is recommended) . Left ovary Not visualized transabdominally or transvaginally. Other findings No abnormal free fluid. IMPRESSION: 1. The endometrium is irregular in appearance and thickened measuring up to 24 mm. There is a focal masslike area  with internal blood flow which spans approximately 3.6 x 2.6 x 4.2 cm. Findings are concerning for underlying endometrial malignancy. Recommend gynecologic follow-up and tissue sampling. These results will be called to the ordering clinician or representative by the Radiologist Assistant, and communication documented in the PACS or [employer]. Electronically Signed   By: Meda Klinefelter M.D.   On: 01/24/2023 16:26    These findings are very concerning for endometrial cancer especially given her pap smear that showed cells suspicious of endometrial carcinoma, this news was shared with them.  They are concerned about how the patient will handle this news given her mental co-morbidities, but will share the news with her. Mr Judy [name] makes healthcare decisions on behalf of his sister.    I will contact our GYN Oncology colleagues to discuss this specific situation with them. Patient will need to undergo anesthesia  (pending pulmonary testing results) in order to have a biopsy.  I want to know if it is a possibility to bypass this step and proceed with management, given her situation. I will ascertain if there is enough information to proceed, or if more imaging is needed (MRI, PET scan etc). If biopsy if needed, appropriate plans will be made.     Jaynie Collins, MD, FACOG Obstetrician & Gynecologist, [facility] for [employer], [facility]

## 2023-01-25 NOTE — Addendum Note (Signed)
Addended by: Jaynie Collins A on: 01/25/2023 01:17 PM   Modules accepted: Orders

## 2023-01-26 ENCOUNTER — Telehealth: Payer: Self-pay | Admitting: *Deleted

## 2023-01-26 NOTE — Telephone Encounter (Signed)
I called Nene and Sherilyn Cooter to verify they had received notification of her CT appointment. He verified they had. Nancy Fetter

## 2023-01-26 NOTE — Telephone Encounter (Signed)
Patient successfully completed ultrasound on 01/21/23.

## 2023-01-27 DIAGNOSIS — R0609 Other forms of dyspnea: Secondary | ICD-10-CM | POA: Diagnosis not present

## 2023-01-27 DIAGNOSIS — J449 Chronic obstructive pulmonary disease, unspecified: Secondary | ICD-10-CM | POA: Diagnosis not present

## 2023-01-28 ENCOUNTER — Ambulatory Visit (INDEPENDENT_AMBULATORY_CARE_PROVIDER_SITE_OTHER): Payer: Medicare Other | Admitting: Pulmonary Disease

## 2023-01-28 ENCOUNTER — Ambulatory Visit: Payer: Medicare Other | Admitting: Obstetrics & Gynecology

## 2023-01-28 DIAGNOSIS — R0609 Other forms of dyspnea: Secondary | ICD-10-CM

## 2023-01-28 LAB — PULMONARY FUNCTION TEST
DL/VA % pred: 155 %
DL/VA: 6.17 ml/min/mmHg/L
DLCO cor % pred: 137 %
DLCO cor: 30.91 ml/min/mmHg
DLCO unc % pred: 137 %
DLCO unc: 30.91 ml/min/mmHg
FEF 25-75 Post: 1.99 L/sec
FEF 25-75 Pre: 1.77 L/sec
FEF2575-%Change-Post: 12 %
FEF2575-%Pred-Post: 101 %
FEF2575-%Pred-Pre: 90 %
FEV1-%Change-Post: 5 %
FEV1-%Pred-Post: 74 %
FEV1-%Pred-Pre: 70 %
FEV1-Post: 1.93 L
FEV1-Pre: 1.83 L
FEV1FVC-%Change-Post: 2 %
FEV1FVC-%Pred-Pre: 103 %
FEV6-%Change-Post: 2 %
FEV6-%Pred-Post: 73 %
FEV6-%Pred-Pre: 71 %
FEV6-Post: 2.41 L
FEV6-Pre: 2.36 L
FEV6FVC-%Pred-Post: 104 %
FEV6FVC-%Pred-Pre: 104 %
FVC-%Change-Post: 2 %
FVC-%Pred-Post: 69 %
FVC-%Pred-Pre: 68 %
FVC-Post: 2.41 L
FVC-Pre: 2.36 L
Post FEV1/FVC ratio: 80 %
Post FEV6/FVC ratio: 100 %
Pre FEV1/FVC ratio: 78 %
Pre FEV6/FVC Ratio: 100 %
RV % pred: 127 %
RV: 3.23 L
TLC % pred: 80 %
TLC: 4.64 L

## 2023-01-28 NOTE — Patient Instructions (Signed)
Full PFT performed today. Patient was unable to complete the Pleth.

## 2023-01-28 NOTE — Progress Notes (Signed)
Full PFT performed today. Patient was unable to complete Pleth.

## 2023-02-02 ENCOUNTER — Ambulatory Visit
Admission: RE | Admit: 2023-02-02 | Discharge: 2023-02-02 | Disposition: A | Discharge status: Home / Self Care | Payer: Medicare Other | Source: Ambulatory Visit | Attending: Obstetrics & Gynecology | Admitting: Obstetrics & Gynecology

## 2023-02-02 DIAGNOSIS — N3289 Other specified disorders of bladder: Secondary | ICD-10-CM | POA: Diagnosis not present

## 2023-02-02 DIAGNOSIS — N9489 Other specified conditions associated with female genital organs and menstrual cycle: Secondary | ICD-10-CM

## 2023-02-02 DIAGNOSIS — R87619 Unspecified abnormal cytological findings in specimens from cervix uteri: Secondary | ICD-10-CM

## 2023-02-02 DIAGNOSIS — M6281 Muscle weakness (generalized): Secondary | ICD-10-CM | POA: Diagnosis not present

## 2023-02-02 DIAGNOSIS — G8191 Hemiplegia, unspecified affecting right dominant side: Secondary | ICD-10-CM | POA: Diagnosis not present

## 2023-02-02 DIAGNOSIS — K429 Umbilical hernia without obstruction or gangrene: Secondary | ICD-10-CM | POA: Diagnosis not present

## 2023-02-02 DIAGNOSIS — R55 Syncope and collapse: Secondary | ICD-10-CM | POA: Diagnosis not present

## 2023-02-02 DIAGNOSIS — N950 Postmenopausal bleeding: Secondary | ICD-10-CM

## 2023-02-02 DIAGNOSIS — R262 Difficulty in walking, not elsewhere classified: Secondary | ICD-10-CM | POA: Diagnosis not present

## 2023-02-02 DIAGNOSIS — K5730 Diverticulosis of large intestine without perforation or abscess without bleeding: Secondary | ICD-10-CM | POA: Diagnosis not present

## 2023-02-02 DIAGNOSIS — M6259 Muscle wasting and atrophy, not elsewhere classified, multiple sites: Secondary | ICD-10-CM | POA: Diagnosis not present

## 2023-02-02 MED ORDER — IOPAMIDOL (ISOVUE-300) INJECTION 61%
100.0000 mL | Freq: Once | INTRAVENOUS | Status: AC | PRN
  Administered 2023-02-02: 100 mL via INTRAVENOUS

## 2023-02-08 ENCOUNTER — Telehealth: Payer: Self-pay

## 2023-02-08 NOTE — Telephone Encounter (Addendum)
-----   Message from Tereso Newcomer, MD sent at 02/08/2023  9:09 AM EDT ----- No evidence of metastatic disease in the abdomen or pelvis.  Will discuss with patient and family during 02/09/23 appointment.  But please call to inform them (in case they are unable to make appointment for any reason).   Jaynie Collins, MD  Notified pt's brother [facility]) results and providers recommendation.  He verbalized understanding with no further questions.  Leonette Nutting

## 2023-02-09 ENCOUNTER — Other Ambulatory Visit: Payer: Self-pay

## 2023-02-09 ENCOUNTER — Encounter: Payer: Self-pay | Admitting: Obstetrics & Gynecology

## 2023-02-09 ENCOUNTER — Ambulatory Visit: Payer: Medicare Other | Admitting: Obstetrics & Gynecology

## 2023-02-09 ENCOUNTER — Ambulatory Visit (INDEPENDENT_AMBULATORY_CARE_PROVIDER_SITE_OTHER): Payer: Medicare Other | Admitting: Obstetrics & Gynecology

## 2023-02-09 VITALS — BP 122/77 | HR 81 | Wt 141.0 lb

## 2023-02-09 DIAGNOSIS — N9489 Other specified conditions associated with female genital organs and menstrual cycle: Secondary | ICD-10-CM | POA: Diagnosis not present

## 2023-02-09 DIAGNOSIS — N950 Postmenopausal bleeding: Secondary | ICD-10-CM

## 2023-02-09 DIAGNOSIS — R87619 Unspecified abnormal cytological findings in specimens from cervix uteri: Secondary | ICD-10-CM

## 2023-02-09 MED ORDER — MEGESTROL ACETATE 40 MG PO TABS
80.0000 mg | ORAL_TABLET | Freq: Every day | ORAL | 2 refills | Status: DC
Start: 2023-02-09 — End: 2023-04-06

## 2023-02-09 MED ORDER — INGREZZA 80 MG PO CAPS: 80.0000 mg | ORAL_CAPSULE | Freq: Every day | ORAL | 2 refills | Status: DC

## 2023-02-09 NOTE — Progress Notes (Signed)
GYNECOLOGY OFFICE VISIT NOTE  History:   Judy [name] is a 77 y.o. F here today for follow up after evaluation for postmenopausal bleeding.  She is here with her brother and sister-in-law, they help her make healthcare decisions. She has limitations with decision making capabilities due to bipolar and severe depression, according to her family.  Pap smear on 01/12/23 showed atypical glandular cells, favor neoplastic -highly concerning for endometrial cancer.  01/21/23 ultrasound showed very abnormally thickened and irregular endometrium with a 4.2 cm vascular endometrial mass.  After her pap smear, her family said she was bed bound for several days due to the 'trauma' from it. She will be unable to undergo in office endometrial biopsy.    Of note, she was supposed to undergo colonoscopy on 12/08/22 and had some respiratory issues, she could not be sedated and procedure was aborted. She is seeing her pulmonologist and got tests done on 01/28/23, recommendations pending.  She has an upcoming appointment with Dr. Judeth Horn (pulmonologist) on 02/23/23.  Today, patient reports continued small amount of bleeding. Wants medication to help with this. Also reports decreased appetite. She denies any pelvic pain or other concerns.    Past Medical History:  Diagnosis Date   Allergic rhinitis    Anemia    Anxiety    Arthritis    Bipolar 1 disorder (HCC)    Depression    Diverticulosis of colon (without mention of hemorrhage) 08/25/2011   Dr. Dulce Sellar   Hearing loss in left ear    since birth   Hyperlipidemia    Hypertension    Insomnia    Obesity    Renal disorder    Tardive dyskinesia    Tremor    Vertigo     Past Surgical History:  Procedure Laterality Date   COLONOSCOPY  08/25/2011   Procedure: COLONOSCOPY;  Surgeon: Freddy Jaksch, MD;  Location: [facility];  Service: Endoscopy;  Laterality: N/A;   DILATION AND CURETTAGE OF UTERUS  1960's    The following portions of the patient's history  were reviewed and updated as appropriate: allergies, current medications, past family history, past medical history, past social history, past surgical history and problem list.   Health Maintenance:  Pap as above. Patient ordered for mammogram.   Review of Systems:  Pertinent items noted in HPI and remainder of comprehensive ROS otherwise negative.  Physical Exam:  BP 122/77   Pulse 81   Wt 141 lb (64 kg)   BMI 20.82 kg/m  CONSTITUTIONAL: Well-developed, well-nourished female in no acute distress.  HEENT:  Normocephalic, atraumatic. External right and left ear normal. No scleral icterus.  NECK: Normal range of motion, supple, no masses noted on observation SKIN: No rash noted. Not diaphoretic. No erythema. No pallor. MUSCULOSKELETAL: Normal range of motion. No edema noted. NEUROLOGIC: Alert and oriented to person, place, and time. Normal muscle tone coordination. No cranial nerve deficit noted. PSYCHIATRIC: Normal mood and affect. Normal behavior. Normal judgment and thought content. CARDIOVASCULAR: Normal heart rate noted RESPIRATORY: Effort and breath sounds normal, no problems with respiration noted ABDOMEN: No masses noted. No other overt distention noted.   PELVIC: Deferred  Labs and Imaging Results for orders placed or performed in visit on 01/28/23 (from the past 840 hour(s))  Pulmonary function test   Collection Time: 01/28/23  3:58 PM  Result Value Ref Range   FVC-Pre 2.36 L   FVC-%Pred-Pre 68 %   FVC-Post 2.41 L   FVC-%Pred-Post 69 %  FVC-%Change-Post 2 %   FEV1-Pre 1.83 L   FEV1-%Pred-Pre 70 %   FEV1-Post 1.93 L   FEV1-%Pred-Post 74 %   FEV1-%Change-Post 5 %   FEV6-Pre 2.36 L   FEV6-%Pred-Pre 71 %   FEV6-Post 2.41 L   FEV6-%Pred-Post 73 %   FEV6-%Change-Post 2 %   Pre FEV1/FVC ratio 78 %   FEV1FVC-%Pred-Pre 103 %   Post FEV1/FVC ratio 80 %   FEV1FVC-%Change-Post 2 %   Pre FEV6/FVC Ratio 100 %   FEV6FVC-%Pred-Pre 104 %   Post FEV6/FVC ratio 100 %    FEV6FVC-%Pred-Post 104 %   FEF 25-75 Pre 1.77 L/sec   FEF2575-%Pred-Pre 90 %   FEF 25-75 Post 1.99 L/sec   FEF2575-%Pred-Post 101 %   FEF2575-%Change-Post 12 %   RV 3.23 L   RV % pred 127 %   TLC 4.64 L   TLC % pred 80 %   DLCO unc 30.91 ml/min/mmHg   DLCO unc % pred 137 %   DLCO cor 30.91 ml/min/mmHg   DLCO cor % pred 137 %   DL/VA 1.61 ml/min/mmHg/L   DL/VA % pred 096 %  Results for orders placed or performed in visit on 01/12/23 (from the past 840 hour(s))  Cytology - PAP( [place])   Collection Time: 01/12/23  4:54 PM  Result Value Ref Range   High risk HPV Negative    Adequacy      Satisfactory for evaluation; transformation zone component PRESENT.   Diagnosis - Atypical glandular cells, favor neoplastic (A)    Diagnosis - See comment (A)    Diagnosis (A)    Diagnosis COMMENT: (A)    Diagnosis (A)     While the overall cellularity of the Pap smear is low, the cells look   Diagnosis (A)     highly atypical and are suspicious for carcinoma, favor endometrial.   Diagnosis (A)     This case was reviewed with Dr. Luisa Hart who agrees with the above   Diagnosis interpretation. (A)    Comment Normal Reference Range HPV - Negative     CT ABDOMEN PELVIS W CONTRAST  Result Date: 02/07/2023 CLINICAL DATA:  Endometrial mass. Atypical glandular cells, favor neoplasia on cervical Pap smear. Postmenopausal bleeding. Genital cancer, staging. EXAM: CT ABDOMEN AND PELVIS WITH CONTRAST TECHNIQUE: Multidetector CT imaging of the abdomen and pelvis was performed using the standard protocol following bolus administration of intravenous contrast. RADIATION DOSE REDUCTION: This exam was performed according to the departmental dose-optimization program which includes automated exposure control, adjustment of the mA and/or kV according to patient size and/or use of iterative reconstruction technique. CONTRAST:  ISOVUE-300 IOPAMIDOL (ISOVUE-300) INJECTION 61% COMPARISON:  Pelvic ultrasound  01/21/2023. FINDINGS: Lower chest: No basilar airspace disease or pleural effusions/pleural thickening. Hepatobiliary: Tiny 3 mm hypodensity in the left lobe of the liver, series 6, image 15, too small to accurately characterize. Retrospectively this is stable from chest CT 11/15/2019. No additional liver lesion. Gallbladder physiologically distended, no calcified stone. No biliary dilatation. Pancreas: No ductal dilatation or inflammation. No evidence of pancreatic mass. Spleen: Normal in size without focal abnormality. Adrenals/Urinary Tract: No adrenal nodule. No hydronephrosis or perinephric edema. Homogeneous renal enhancement with symmetric excretion on delayed phase imaging. No focal renal abnormality. Urinary bladder is partially distended without wall thickening. Stomach/Bowel: No bowel wall thickening, inflammation or obstruction. Minimal sigmoid diverticulosis without diverticulitis. Moderate colonic stool burden. Normal appendix is tentatively visualized. Vascular/Lymphatic: Minimal aortic atherosclerosis. No aortic aneurysm. The portal vein is patent.  No enlarged pelvic, inguinal, or abdominal lymph nodes. No retroperitoneal adenopathy. Reproductive: Heterogeneous lobulated uterus, recently assessed on pelvic ultrasound. No definite pelvic free fluid. Other: No abdominal ascites or pelvic free fluid. No omental thickening or nodularity. Diminutive fat containing umbilical hernia. Musculoskeletal: No focal bone lesion or evidence of osseous metastatic disease. Chronic T12 compression deformity unchanged from prior chest CT. Degenerative change at L4-L5 with associated anterolisthesis. Additional L5-S1 degenerative change. IMPRESSION: 1. Heterogeneous lobulated uterus, recently assessed on pelvic ultrasound. 2. No evidence of metastatic disease in the abdomen or pelvis. 3. Minimal sigmoid diverticulosis without diverticulitis. Electronically Signed   By: Narda Rutherford M.D.   On: 02/07/2023 12:38   US  PELVIC COMPLETE WITH TRANSVAGINAL  Result Date: 01/24/2023 CLINICAL DATA:  PMB EXAM: TRANSABDOMINAL AND TRANSVAGINAL ULTRASOUND OF PELVIS TECHNIQUE: Both transabdominal and transvaginal ultrasound examinations of the pelvis were performed. Transabdominal technique was performed for global imaging of the pelvis including uterus, ovaries, adnexal regions, and pelvic cul-de-sac. It was necessary to proceed with endovaginal exam following the transabdominal exam to visualize the RIGHT ovary, uterus and the endometrium. COMPARISON:  Ultrasound dated April 21, 2022. FINDINGS: Uterus Measurements: 7.5 by 4.4 x 7.0 cm = volume: 122 mL. Revisualization of several fibroids. Posterior favored subserosal fibroid measures approximately 3.8 by 3.1 x 4.3 cm. Additional favored subserosal fibroid of the LEFT uterus measures approximately 2.3 x 2.3 x 2.8 cm. Endometrium Thickness: 24 mm. The endometrium is irregular in appearance. There is a frond like contour appreciated adjacent to small amount of endometrial fluid. There is a focal masslike area with internal blood flow which spans approximately 3.6 x 2.6 x 4.2 cm. Right ovary Measurements: 2.8 x 2.5 x 3.8 cm = volume: 13.9 mL. There is a benign appearing cyst in the RIGHT ovary measuring 23 by 19 x 15 mm (for which no dedicated imaging follow-up is recommended) . Left ovary Not visualized transabdominally or transvaginally. Other findings No abnormal free fluid. IMPRESSION: 1. The endometrium is irregular in appearance and thickened measuring up to 24 mm. There is a focal masslike area with internal blood flow which spans approximately 3.6 x 2.6 x 4.2 cm. Findings are concerning for underlying endometrial malignancy. Recommend gynecologic follow-up and tissue sampling. These results will be called to the ordering clinician or representative by the Radiologist Assistant, and communication documented in the PACS or [employer]. Electronically Signed   By: Meda Klinefelter  M.D.   On: 01/24/2023 16:26      Assessment and Plan:     1. Postmenopausal bleeding 2. Endometrial mass 3. Atypical glandular cells, favor neoplasia on cervical Pap smear Reviewed recent CT scan results, also reviewed ultrasound results.  Given concern about neoplastic process, discussed recommendation by Dr. Pricilla Holm about possibly needing a staged procedure involving sampling in OR followed by hysterectomy/definitive surgery as long as she is cleared by Pulmonology to undergo anesthesia.  Message was sent to Dr. Judeth Horn, will follow up his recommendations.  Appointment made for patient with Dr. Pricilla Holm in Gynecology Oncology on 02/25/23 at 11:15 am.  All questions answered. In the meantime, Megace ordered to help with bleeding. Will monitor response. Bleeding precautions reviewed. - Ambulatory referral to Gynecologic Oncology - megestrol (MEGACE) 40 MG tablet; Take 2 tablets (80 mg total) by mouth daily.  Dispense: 60 tablet; Refill: 2    I spent  45  minutes dedicated to the care of this patient including pre-visit review of records, face to face time with the patient discussing her conditions  and treatments and post visit orders.    Jaynie Collins, MD, FACOG Obstetrician & Gynecologist, [facility] for [employer], [facility]

## 2023-02-14 ENCOUNTER — Telehealth: Payer: Self-pay | Admitting: Gynecologic Oncology

## 2023-02-14 NOTE — Telephone Encounter (Signed)
Patients brother called and had to reschedule this patients appointment from 02/25/2023 to 03/04/2023 at 11:15am.  Patient's brother confirmed and understood.

## 2023-02-17 ENCOUNTER — Ambulatory Visit: Payer: Medicare Other | Admitting: Physician Assistant

## 2023-02-18 ENCOUNTER — Other Ambulatory Visit: Payer: Self-pay

## 2023-02-18 ENCOUNTER — Telehealth: Payer: Self-pay | Admitting: Physician Assistant

## 2023-02-18 MED ORDER — BUPROPION HCL ER (XL) 150 MG PO TB24: 150.0000 mg | ORAL_TABLET | Freq: Every day | ORAL | 0 refills | Status: DC

## 2023-02-18 NOTE — Telephone Encounter (Signed)
Pt called asking for a refill on her bupropion xl 150 mg. Pharmacy is [facility] on [address]

## 2023-02-23 ENCOUNTER — Encounter: Payer: Self-pay | Admitting: Pulmonary Disease

## 2023-02-23 ENCOUNTER — Ambulatory Visit (INDEPENDENT_AMBULATORY_CARE_PROVIDER_SITE_OTHER): Payer: Medicare Other | Admitting: Pulmonary Disease

## 2023-02-23 VITALS — BP 110/80 | HR 94 | Ht 69.0 in | Wt 141.8 lb

## 2023-02-23 DIAGNOSIS — R911 Solitary pulmonary nodule: Secondary | ICD-10-CM | POA: Diagnosis not present

## 2023-02-23 DIAGNOSIS — R0609 Other forms of dyspnea: Secondary | ICD-10-CM

## 2023-02-23 NOTE — Progress Notes (Signed)
@Patient  ID: Judy [name], female    DOB: 19-Mar-1946, 77 y.o.   MRN: [id number]  Chief Complaint  Patient presents with   Follow-up    F/up breathing    Referring provider: Arnette Felts, FNP  HPI:   77 y.o. woman whom we are seeing for evaluation of dyspnea on exertion.  Multiple notes from obstetrics/gynecology reviewed.  Overall she states her breathing is little bit better.  Using DuoNeb sparingly.  But feels like they do help.  Not even using on a daily basis.  PFTs performed in interim, personally reviewed and interpreted as spirometry just of mild restriction versus air trapping, no bronchodilator response, lung volumes unable to differentiate between due to, does not meet quality standards, cannot be interpreted, DLCO within normal limits.  Discussed with patient based on her prior imaging as well as normal DLCO, restriction is not related to the lungs at present.  Do suspect possible air trapping.  DuoNebs are designed to help with this.  Encouraged to continue this.  Counseled her on expectant management with breathing in the future, possible medication changes in the future and we should do so.  Family states she has upcoming surgeries, procedures planned.  Needs preoperative risk assessment.  HPI initial visit: Difficulty a bit difficult to obtain from patient.  Unclear if just due to being hard of hearing or other cognitive issue.  History gathered from patient as well as brother, sister-in-law in the room.  State dyspnea on exertion over the last 5 to 6 months.  No clear inciting event.  Denies any respiratory illness etc.  No time of day when things are better or worse.  No position that make things better or worse.  No seasonal or environmental factors she can do the 5 to make things better or worse.  She was given a prescription for albuterol.  Says it helps sometimes.  Not all the time.  Symptoms seem pretty stable day-to-day.  Largely unchanged over time.  Most recent  chest CT, in the midst of the symptoms, 07/2022 reviewed and interpreted as clear lungs bilaterally, stable less than 1 cm groundglass opacity/nodule in the right upper lobe.  She was seen by pulmonary in 2018.  PFTs difficult to perform but no fixed obstruction.  Felt he related to deconditioning, habitus at the time.  Chest imaging clear at that time.  Remains clear.  Discussed at length with clear chest imaging unlikely to have significant pulmonary abnormality.  Asthma is possible does not show up well on imaging nor pulmonary function test.  Discussed role and rationale for empiric treatment as well as additional investigation which they expressed understanding and agreed to.   Questionaires / Pulmonary Flowsheets:   ACT:      No data to display          MMRC:     No data to display          Epworth:      No data to display          Tests:   FENO:  No results found for: "NITRICOXIDE"  PFT:    Latest Ref Rng & Units 01/28/2023    3:58 PM 04/15/2017    2:00 PM  PFT Results  FVC-Pre L 2.36  1.87   FVC-Predicted Pre % 68  63   FVC-Post L 2.41    FVC-Predicted Post % 69    Pre FEV1/FVC % % 78  81   Post FEV1/FCV % % 80  FEV1-Pre L 1.83  1.52   FEV1-Predicted Pre % 70  65   FEV1-Post L 1.93    DLCO uncorrected ml/min/mmHg 30.91    DLCO UNC% % 137    DLCO corrected ml/min/mmHg 30.91    DLCO COR %Predicted % 137    DLVA Predicted % 155    TLC L 4.64    TLC % Predicted % 80    RV % Predicted % 127    Personally reviewed, not fully interpretable, spirometry suggestive of moderate restriction versus air trapping, unable to do lung volumes once again, DLCO within normal limits, results improved compared to 2018  WALK:     06/09/2017    3:43 PM 04/15/2017    4:47 PM  SIX MIN WALK  Supplimental Oxygen during Test? (L/min) No   Tech Comments: slow to normal pace/leg pain and SOB//lmr normal pace/SOB//lmr    Imaging: Personally reviewed and as per EMR  discussion this note CT ABDOMEN PELVIS W CONTRAST  Result Date: 02/07/2023 CLINICAL DATA:  Endometrial mass. Atypical glandular cells, favor neoplasia on cervical Pap smear. Postmenopausal bleeding. Genital cancer, staging. EXAM: CT ABDOMEN AND PELVIS WITH CONTRAST TECHNIQUE: Multidetector CT imaging of the abdomen and pelvis was performed using the standard protocol following bolus administration of intravenous contrast. RADIATION DOSE REDUCTION: This exam was performed according to the departmental dose-optimization program which includes automated exposure control, adjustment of the mA and/or kV according to patient size and/or use of iterative reconstruction technique. CONTRAST:  ISOVUE-300 IOPAMIDOL (ISOVUE-300) INJECTION 61% COMPARISON:  Pelvic ultrasound 01/21/2023. FINDINGS: Lower chest: No basilar airspace disease or pleural effusions/pleural thickening. Hepatobiliary: Tiny 3 mm hypodensity in the left lobe of the liver, series 6, image 15, too small to accurately characterize. Retrospectively this is stable from chest CT 11/15/2019. No additional liver lesion. Gallbladder physiologically distended, no calcified stone. No biliary dilatation. Pancreas: No ductal dilatation or inflammation. No evidence of pancreatic mass. Spleen: Normal in size without focal abnormality. Adrenals/Urinary Tract: No adrenal nodule. No hydronephrosis or perinephric edema. Homogeneous renal enhancement with symmetric excretion on delayed phase imaging. No focal renal abnormality. Urinary bladder is partially distended without wall thickening. Stomach/Bowel: No bowel wall thickening, inflammation or obstruction. Minimal sigmoid diverticulosis without diverticulitis. Moderate colonic stool burden. Normal appendix is tentatively visualized. Vascular/Lymphatic: Minimal aortic atherosclerosis. No aortic aneurysm. The portal vein is patent. No enlarged pelvic, inguinal, or abdominal lymph nodes. No retroperitoneal adenopathy.  Reproductive: Heterogeneous lobulated uterus, recently assessed on pelvic ultrasound. No definite pelvic free fluid. Other: No abdominal ascites or pelvic free fluid. No omental thickening or nodularity. Diminutive fat containing umbilical hernia. Musculoskeletal: No focal bone lesion or evidence of osseous metastatic disease. Chronic T12 compression deformity unchanged from prior chest CT. Degenerative change at L4-L5 with associated anterolisthesis. Additional L5-S1 degenerative change. IMPRESSION: 1. Heterogeneous lobulated uterus, recently assessed on pelvic ultrasound. 2. No evidence of metastatic disease in the abdomen or pelvis. 3. Minimal sigmoid diverticulosis without diverticulitis. Electronically Signed   By: Narda Rutherford M.D.   On: 02/07/2023 12:38    Lab Results: Personally reviewed CBC    Component Value Date/Time   WBC 5.6 11/24/2022 1233   WBC 5.1 04/28/2022 0437   RBC 4.84 11/24/2022 1233   RBC 4.04 04/28/2022 0437   HGB 14.6 11/24/2022 1233   HCT 45.5 11/24/2022 1233   PLT 263 11/24/2022 1233   MCV 94 11/24/2022 1233   MCH 30.2 11/24/2022 1233   MCH 30.2 04/28/2022 0437   MCHC 32.1 11/24/2022 1233  MCHC 33.2 04/28/2022 0437   RDW 13.5 11/24/2022 1233   LYMPHSABS 1.9 04/28/2022 0437   LYMPHSABS 1.5 07/10/2018 1220   MONOABS 0.6 04/28/2022 0437   EOSABS 0.2 04/28/2022 0437   EOSABS 0.0 07/10/2018 1220   BASOSABS 0.0 04/28/2022 0437   BASOSABS 0.0 07/10/2018 1220    BMET    Component Value Date/Time   NA 141 11/04/2022 1514   K 4.2 11/04/2022 1514   CL 104 11/04/2022 1514   CO2 23 11/04/2022 1514   GLUCOSE 88 11/04/2022 1514   GLUCOSE 87 04/28/2022 0437   BUN 12 11/04/2022 1514   CREATININE 1.02 (H) 11/04/2022 1514   CREATININE 1.16 (H) 03/21/2018 1515   CALCIUM 9.9 11/04/2022 1514   GFRNONAA >60 04/28/2022 0437   GFRNONAA 48 (L) 03/21/2018 1515   GFRAA >60 11/20/2019 0314   GFRAA 55 (L) 03/21/2018 1515    BNP    Component Value Date/Time   BNP  14.6 03/02/2022 1539   BNP 41.6 11/15/2019 1716    ProBNP    Component Value Date/Time   PROBNP 23.0 04/15/2017 1703    Specialty Problems       Pulmonary Problems   ALLERGIC RHINITIS    Qualifier: Diagnosis of  By: Delrae Alfred MD, Elizabeth        URI (upper respiratory infection)   Dyspnea on exertion    - Spirometry 04/15/2017  FEV1 1.78 (76%)  Ratio 78 s curvature p no rx   04/15/2017   Walked RA  2 laps @ 185 ft each stopped due to  Sob/ no desats/ nl pace 06/09/2017   Walked RA  2 laps @ 185 ft each stopped due to  Weak legs > sob/ slow pace, no desat         Solitary pulmonary nodule on lung CT    See ct 04/08/17 x 6 mm RUL/ 3 mm LUL        No Known Allergies  Immunization History  Administered Date(s) Administered   Fluad Quad(high Dose 65+) 06/28/2022   Influenza, High Dose Seasonal PF 07/24/2018, 07/02/2019   Influenza,inj,Quad PF,6+ Mos 12/11/2013, 09/12/2015, 07/20/2016, 08/10/2017   Influenza-Unspecified 08/04/2021   PFIZER(Purple Top)SARS-COV-2 Vaccination 12/24/2019, 01/15/2020, 07/14/2020, 10/09/2020   Pneumococcal Polysaccharide-23 12/11/2013, 11/08/2019   Zoster Recombinat (Shingrix) 03/02/2022, 08/10/2022    Past Medical History:  Diagnosis Date   Allergic rhinitis    Anemia    Anxiety    Arthritis    Bipolar 1 disorder (HCC)    Depression    Diverticulosis of colon (without mention of hemorrhage) 08/25/2011   Dr. Dulce Sellar   Hearing loss in left ear    since birth   Hyperlipidemia    Hypertension    Insomnia    Obesity    Renal disorder    Tardive dyskinesia    Tremor    Vertigo     Tobacco History: Social History   Tobacco Use  Smoking Status Never  Smokeless Tobacco Never   Counseling given: Not Answered   Continue to not smoke  Outpatient Encounter Medications as of 02/23/2023  Medication Sig   acetaminophen (TYLENOL) 325 MG tablet Take 2 tablets (650 mg total) by mouth every 6 (six) hours as needed for mild pain (or Fever  >/= 101).   albuterol (VENTOLIN HFA) 108 (90 Base) MCG/ACT inhaler Inhale 2 puffs into the lungs every 6 (six) hours as needed for wheezing or shortness of breath. Use with spacer   atorvastatin (LIPITOR) 10 MG tablet TAKE 1  TABLET BY MOUTH EVERY DAY   b complex vitamins capsule Take 1 capsule by mouth daily.   buPROPion (WELLBUTRIN XL) 150 MG 24 hr tablet Take 1 tablet (150 mg total) by mouth daily.   cholecalciferol (VITAMIN D3) 25 MCG (1000 UNIT) tablet Take 1,000 Units by mouth daily.   INGREZZA 80 MG capsule Take 1 capsule (80 mg total) by mouth daily.   ipratropium-albuterol (DUONEB) 0.5-2.5 (3) MG/3ML SOLN 1 treatment in the morning, 1 treatment in the evenings, every 6 hours as needed in between for shortness of breath or wheezing   meclizine (ANTIVERT) 12.5 MG tablet Take 1 tablet (12.5 mg total) by mouth 3 (three) times daily as needed for dizziness.   megestrol (MEGACE) 40 MG tablet Take 2 tablets (80 mg total) by mouth daily.   mirtazapine (REMERON) 7.5 MG tablet TAKE 0.5-1 TABLETS (3.75-7.5 MG TOTAL) BY MOUTH AT BEDTIME AS NEEDED.   Multiple Vitamin (MULTIVITAMIN) capsule Take 1 capsule by mouth daily.   Omega-3 Fatty Acids (FISH OIL BURP-LESS PO) Take by mouth.   polyethylene glycol (MIRALAX / GLYCOLAX) 17 g packet Take 17 g by mouth daily.   propranolol (INDERAL) 10 MG tablet Take 1 tablet (10 mg total) by mouth 3 (three) times daily as needed.   No facility-administered encounter medications on file as of 02/23/2023.     Review of Systems  Review of Systems  No chest pain with exertion.  No orthopnea or PND.  Comprehensive review of systems otherwise negative. Physical Exam  BP 110/80 (BP Location: Left Arm)   Pulse 94   Ht 5\' 9"  (1.753 m)   Wt 141 lb 12.8 oz (64.3 kg)   SpO2 99%   BMI 20.94 kg/m   Wt Readings from Last 5 Encounters:  02/23/23 141 lb 12.8 oz (64.3 kg)  02/09/23 141 lb (64 kg)  01/12/23 145 lb 12.8 oz (66.1 kg)  12/24/22 146 lb 3.2 oz (66.3 kg)   12/16/22 155 lb (70.3 kg)    BMI Readings from Last 5 Encounters:  02/23/23 20.94 kg/m  02/09/23 20.82 kg/m  01/12/23 21.53 kg/m  12/24/22 21.59 kg/m  12/16/22 22.89 kg/m     Physical Exam General: Sitting in chair, no acute distress Eyes: EOMI, no icterus Neck: Supple, no JVP Pulmonary: Clear, no work of breathing Cardiovascular: Warm, no edema Abdomen: Distended, bowel sounds present MSK: No synovitis, no joint effusion Neuro: Normal gait, no weakness, hard of hearing Psych: normal mood, full affect   Assessment & Plan:   Dyspnea on exertion: Present for a few months but review of records indicates seen in 2018 in pulmonary clinic for the same.  PFTs hard to complete.  But felt that time related to habitus, deconditioning.  Her chest imaging historically and recently are clear, unlikely be a pulmonary issue.  PFT suggestive of air trapping versus restriction, DLCO within normal limits so intraparenchymal restriction on unlikely and not seen on prior chest imaging.  Unable to perform lung volumes to differentiate the 2.  Likely air trapping.  Continue DuoNebs given mild improvement in symptoms.  Lung nodule: GGO being surveilled by PCP, first noted fall 2019, stable over the last 4 years.  1 year follow-up 07/2023 recommended and then definitively benign, new CT scan ordered today.  Preoperative evaluation: Pulmonary medicine does not provide preoperative clearance but rather a preoperative restratification.  Based on the ARISCAT model, the patient is low or 1.4% risk of postoperative pulmonary complication assuming duration of surgery is less than 3  hours, if greater than 3 hours, the patient is intermediate or 13.3% risk of postoperative pulmonary complication.  The only variable risk factors duration of surgery which is out of my control, out of the patient's control.  No modifiable risk factors identified. -- Please provide DuoNebs in the preoperative area and in the  PACU  Return in about 6 months (around 08/26/2023) for f/u Dr. Judeth Horn.   Karren Burly, MD 02/23/2023   I spent 40 minutes in the care of the patient including face-to-face visit, coordination of care, review of records.

## 2023-02-23 NOTE — Patient Instructions (Addendum)
Nice to see you again  Continue the nebulizer solution as needed, can use up to 4 times a day.  As long as you are using 2 times a day or less like now, okay to stick with current plan for the foreseeable future  Will make sure my note is sent to your OB/GYN today for preoperative evaluation.  We have been following a small groundglass opacity on your CT scan, due for repeat 07/2023, I ordered this today, assuming this looks stable to unchanged, no further follow-up will be needed, this will give [place] 5 full years of surveillance  Return to clinic in 6 months or sooner as needed with Dr. Judeth Horn

## 2023-02-25 ENCOUNTER — Ambulatory Visit: Payer: Medicare Other | Admitting: Gynecologic Oncology

## 2023-02-26 DIAGNOSIS — R0609 Other forms of dyspnea: Secondary | ICD-10-CM | POA: Diagnosis not present

## 2023-02-26 DIAGNOSIS — J449 Chronic obstructive pulmonary disease, unspecified: Secondary | ICD-10-CM | POA: Diagnosis not present

## 2023-03-02 NOTE — Progress Notes (Signed)
GYNECOLOGIC ONCOLOGY NEW PATIENT CONSULTATION   Patient Name: Judy [name]  Patient Age: 77 y.o. Date of Service: 03/04/23 Referring Provider: Dr. Jaynie Collins   Primary Care Provider: Arnette Felts, FNP Consulting Provider: Eugene Garnet, MD   Assessment/Plan:  Postmenopausal patient with bleeding on recent Pap smear most suggestive of endometrial cancer.  Spoke in depth with the patient, her brother, and sister-in-law regarding workup thus far.  We reviewed imaging findings together and discussed her recent Pap smear which shows atypical cells concerning for adenocarcinoma, favored to represent an endometrial type.  I reviewed supporting evidence that this is endometrial rather than cervical including that Pap was negative for high risk HPV and that her ultrasound showed very thickened and abnormal appearing endometrium.  Typically, we discussed that neck step in workup would be an endometrial biopsy or endometrial sampling in the operating room for patients who cannot tolerate such a procedure in clinic.  The patient does not want to proceed with a biopsy in clinic.  She and her family have significant concerns about her risk with anesthesia and would like to limit any unnecessary time under anesthesia.  I had previously discussed with the patient's OB/GYN the possibility of a staged procedure.  This would include performing a D&C and sending endometrium to pathology for frozen section.  We would operate under the assumption that endometrial curettings will show endometrial cancer.  I would not act ICG in the event that sentinel lymph node biopsy is indicated.  We would then start the procedure which would include total hysterectomy and bilateral salpingo-oophorectomy but wait for sentinel lymph node biopsy or lymph node removal until we have frozen section results.  This would allow all of the surgery that patient needs to be performed at 1 time.  Based on recent visit with  pulmonologist, the patient's risk of major pulmonary complications in the postoperative period has been estimated to be quite low if surgery can be kept under 3 hours.  This would be the goal.  We discussed that if sentinel lymph node mapping was not successful, decision would be made whether to proceed with lymphadenectomy based on frozen section results (using Mayo criteria) but also being mindful of keeping surgery as short as possible to decrease peri-operative risk. Will reach out to PCP re clearance too.  We reviewed the nature of endometrial cancer and its recommended surgical staging, including total hysterectomy, bilateral salpingo-oophorectomy, and lymph node assessment. The patient is a suitable candidate for staging via a minimally invasive approach to surgery.  We reviewed that robotic assistance would be used to complete the surgery.   We reviewed the sentinel lymph node technique. Risks and benefits of sentinel lymph node biopsy was reviewed. We reviewed the technique and ICG dye. The patient DOES NOT have an iodine allergy or known liver dysfunction. We reviewed the false negative rate (0.4%), and that 3% of patients with metastatic disease will not have it detected by SLN biopsy in endometrial cancer. A low risk of allergic reaction to the dye, <0.2% for ICG, has been reported. We also discussed that in the case of failed mapping, which occurs 40% of the time, a bilateral or unilateral lymphadenectomy will be performed at the surgeon's discretion.   Potential benefits of sentinel nodes including a higher detection rate for metastasis due to ultrastaging and potential reduction in operative morbidity. However, there remains uncertainty as to the role for treatment of micrometastatic disease. Further, the benefit of operative morbidity associated with the SLN technique  in endometrial cancer is not yet completely known. In other patient populations (e.g. the cervical cancer population) there has  been observed reductions in morbidity with SLN biopsy compared to pelvic lymphadenectomy. Lymphedema, nerve dysfunction and lymphocysts are all potential risks with the SLN technique as with complete lymphadenectomy. Additional risks to the patient include the risk of damage to an internal organ while operating in an altered view (e.g. the black and white image of the robotic fluorescence imaging mode).   We discussed the plan for a dilation and curettage, robotic assisted hysterectomy, bilateral salpingo-oophorectomy, sentinel lymph node evaluation, possible lymph node dissection, possible laparotomy. The risks of surgery were discussed in detail and she understands these to include infection; wound separation; hernia; vaginal cuff separation, injury to adjacent organs such as bowel, bladder, blood vessels, ureters and nerves; bleeding which may require blood transfusion; anesthesia risk; thromboembolic events; possible death; unforeseen complications; possible need for re-exploration; medical complications such as heart attack, stroke, pleural effusion and pneumonia; and, if full lymphadenectomy is performed the risk of lymphedema and lymphocyst. The patient will receive DVT and antibiotic prophylaxis as indicated. She voiced a clear understanding. She had the opportunity to ask questions. Perioperative instructions were reviewed with her. Prescriptions for post-op medications were sent to her pharmacy of choice.  The patient lives alone and has some but not a lot of family support nearby.  The patient's brother and sister-in-law were interested in meeting with social work today.  A copy of this note was sent to the patient's referring provider.   75 minutes of total time was spent for this patient encounter, including preparation, face-to-face counseling with the patient and coordination of care, and documentation of the encounter.  Eugene Garnet, MD  Division of Gynecologic Oncology  Department of  Obstetrics and Gynecology  [facility]  ___________________________________________  Chief Complaint: Chief Complaint  Patient presents with   endometrial mass    History of Present Illness:  Judy [name] is a 77 y.o. y.o. female who is seen in consultation at the request of Dr. Macon Large for an evaluation of abnormal pap favoring endometrial cancer.  01/12/23: Pap - atypical glandular cell, favor neoplastic. While overall cellularity of the pap is low, cells look highly atypical and are suspicious for carcinoma favored to be endometrial. HR HPV negative.   Pelvic ultrasound on 01/21/23 revealed a uterus measuring 7.5 x 5.4 x 7 cm.  Multiple fibroids noted measuring up to 3.8 cm.  Endometrium is thickened measuring 24 mm and irregular.  There is a frond-like contour appreciated adjacent to a small amount of endometrial free fluid and a focal masslike area with blood flow measuring up to 4.2 cm.  Left ovary not visualized.  Right ovary contains a 2.3 cm simple appearing cyst.  CT A/P on 02/07/23 reveals a heterogenous lobulated uterus. No evidence of metastatic disease. Minimal sigmoid diverticulosis without diverticulitis.  The patient had recent visit with pulmonology. 1.4% risk of postop pulmonary complication estimated with duration of surgery is less than 3 hours.  Her hemoglobin A1c was 5.6% in January.  The patient presents with her brother and sister-in-law today.  Starting in June of last year, she developed intermittent spotting, occurring most days.  She wears depends due to urinary incontinence, has some blood on the depends intermittently.  Patient sister-in-law denies that this is ever large quantity.  There is been no passage of clots, associated pelvic pain or cramping.  Since her recent Pap smear, the patient  notes decreased bleeding after being started on Megace.  The patient endorses approximately 20-25 pounds of weight loss over the last year.  She  endorses decreased appetite.  She has had some nausea yesterday and today but otherwise denies any nausea or emesis.  She has intermittent constipation at baseline, uses MiraLAX as needed.  She endorses some urinary incontinence.  She endorses some mild shortness of breath intermittently with ambulation.  Her GYN history is notable for a D&C years ago -does not remember why this was done.  The patient lives alone.  She has a granddaughter and grandson who are nearby.  PAST MEDICAL HISTORY:  Past Medical History:  Diagnosis Date   Allergic rhinitis    Anemia    Anxiety    Arthritis    Bipolar 1 disorder (HCC)    Depression    Diverticulosis of colon (without mention of hemorrhage) 08/25/2011   Dr. Dulce Sellar   Hearing loss in left ear    since birth   Hyperlipidemia    Hypertension    resolved   Insomnia    Obesity    Renal disorder    Tardive dyskinesia    Tremor    Vertigo      PAST SURGICAL HISTORY:  Past Surgical History:  Procedure Laterality Date   COLONOSCOPY  08/25/2011   Procedure: COLONOSCOPY;  Surgeon: Freddy Jaksch, MD;  Location: [facility];  Service: Endoscopy;  Laterality: N/A;   DILATION AND CURETTAGE OF UTERUS      OB/GYN HISTORY:  OB History  Gravida Para Term Preterm AB Living  1 1          SAB IAB Ectopic Multiple Live Births               # Outcome Date GA Lbr Len/2nd Weight Sex Delivery Anes PTL Lv  1 Para             No LMP recorded. Patient is postmenopausal.  Hx of HRT: Denies Hx of STDs: Denies Last pap: see HPI History of abnormal pap smears: recent  SCREENING STUDIES:  Last colonoscopy: 2012  MEDICATIONS: Outpatient Encounter Medications as of 03/04/2023  Medication Sig   acetaminophen (TYLENOL) 325 MG tablet Take 2 tablets (650 mg total) by mouth every 6 (six) hours as needed for mild pain (or Fever >/= 101).   albuterol (VENTOLIN HFA) 108 (90 Base) MCG/ACT inhaler Inhale 2 puffs into the lungs every 6 (six) hours as needed  for wheezing or shortness of breath. Use with spacer   atorvastatin (LIPITOR) 10 MG tablet TAKE 1 TABLET BY MOUTH EVERY DAY   b complex vitamins capsule Take 1 capsule by mouth daily.   buPROPion (WELLBUTRIN XL) 150 MG 24 hr tablet Take 1 tablet (150 mg total) by mouth daily.   cholecalciferol (VITAMIN D3) 25 MCG (1000 UNIT) tablet Take 1,000 Units by mouth daily.   INGREZZA 80 MG capsule Take 1 capsule (80 mg total) by mouth daily.   ipratropium-albuterol (DUONEB) 0.5-2.5 (3) MG/3ML SOLN 1 treatment in the morning, 1 treatment in the evenings, every 6 hours as needed in between for shortness of breath or wheezing   meclizine (ANTIVERT) 12.5 MG tablet Take 1 tablet (12.5 mg total) by mouth 3 (three) times daily as needed for dizziness.   megestrol (MEGACE) 40 MG tablet Take 2 tablets (80 mg total) by mouth daily.   mirtazapine (REMERON) 7.5 MG tablet TAKE 0.5-1 TABLETS (3.75-7.5 MG TOTAL) BY MOUTH AT BEDTIME AS NEEDED.  Multiple Vitamin (MULTIVITAMIN) capsule Take 1 capsule by mouth daily.   Omega-3 Fatty Acids (FISH OIL BURP-LESS PO) Take by mouth.   polyethylene glycol (MIRALAX / GLYCOLAX) 17 g packet Take 17 g by mouth daily.   propranolol (INDERAL) 10 MG tablet Take 1 tablet (10 mg total) by mouth 3 (three) times daily as needed.   senna-docusate (SENOKOT-S) 8.6-50 MG tablet Take 2 tablets by mouth at bedtime. For AFTER surgery, do not take if having diarrhea   traMADol (ULTRAM) 50 MG tablet Take 1 tablet (50 mg total) by mouth every 6 (six) hours as needed for severe pain. For AFTER surgery only, do not take and drive   No facility-administered encounter medications on file as of 03/04/2023.    ALLERGIES:  No Known Allergies   FAMILY HISTORY:  Family History  Problem Relation Age of Onset   Hypertension Mother    Kidney disease Mother    Stomach cancer Father    Hyperlipidemia Sister    Hypothyroidism Sister    Prostate cancer Brother    Hypertension Brother    Kidney disease  Brother    Stroke Brother    Prostate cancer Brother    Breast cancer Neg Hx    Pancreatic cancer Neg Hx    Ovarian cancer Neg Hx    Endometrial cancer Neg Hx      SOCIAL HISTORY:  Social Connections: Not on file    REVIEW OF SYSTEMS:  + Decreased appetite, chills, fatigue, weight loss, hearing loss, ringing in the ears, shortness of breath, abdominal pain, incontinence, vaginal bleeding, dizziness, gait problem, anxiety, depression. Denies fevers. Denies neck lumps or masses, mouth sores or voice changes. Denies cough or wheezing.   Denies chest pain or palpitations. Denies leg swelling. Denies abdominal distention, blood in stools, constipation, diarrhea, nausea, vomiting, or early satiety. Denies pain with intercourse, dysuria, frequency, hematuria. Denies hot flashes, pelvic pain, or vaginal discharge.   Denies joint pain, back pain or muscle pain/cramps. Denies itching, rash, or wounds. Denies headaches, numbness or seizures. Denies swollen lymph nodes or glands, denies easy bruising or bleeding. Denies confusion, or decreased concentration.  Physical Exam:  Vital Signs for this encounter:  Blood pressure (!) 146/87, pulse 83, temperature 99.1 F (37.3 C), temperature source Oral, resp. rate 20, height 5' 9.69" (1.77 m), weight 138 lb 6.4 oz (62.8 kg), SpO2 100 %. Body mass index is 20.04 kg/m. General: Alert, oriented, no acute distress.  HEENT: Normocephalic, atraumatic. Sclera anicteric.  Chest: Clear to auscultation bilaterally. No wheezes, rhonchi, or rales. Cardiovascular: Regular rate and rhythm, no murmurs, rubs, or gallops.  Abdomen: Normoactive bowel sounds. Soft, nondistended, nontender to palpation. No masses or hepatosplenomegaly appreciated. No palpable fluid wave.  Extremities: Grossly normal range of motion. Warm, well perfused. No edema bilaterally.  Skin: No rashes or lesions.  Lymphatics: No cervical, supraclavicular, or inguinal adenopathy.  GU:   Normal external female genitalia. No lesions. No discharge or bleeding.             Bladder/urethra:  No lesions or masses, well supported bladder             Vagina: Moderately atrophic, no lesions noted.             Cervix: Normal appearing, no lesions.             Uterus: Small, mobile, no parametrial involvement or nodularity.             Adnexa: No masses appreciated.  Rectal: Deferred.  LABORATORY AND RADIOLOGIC DATA:  Outside medical records were reviewed to synthesize the above history, along with the history and physical obtained during the visit.   Lab Results  Component Value Date   WBC 5.6 11/24/2022   HGB 14.6 11/24/2022   HCT 45.5 11/24/2022   PLT 263 11/24/2022   GLUCOSE 88 11/04/2022   CHOL 151 11/04/2022   TRIG 86 11/04/2022   HDL 72 11/04/2022   LDLCALC 63 11/04/2022   ALT 42 (H) 11/04/2022   AST 37 11/04/2022   NA 141 11/04/2022   K 4.2 11/04/2022   CL 104 11/04/2022   CREATININE 1.02 (H) 11/04/2022   BUN 12 11/04/2022   CO2 23 11/04/2022   TSH 0.995 11/24/2022   INR 1.14 07/07/2018   HGBA1C 5.6 11/04/2022

## 2023-03-03 ENCOUNTER — Encounter: Payer: Self-pay | Admitting: Gynecologic Oncology

## 2023-03-04 ENCOUNTER — Inpatient Hospital Stay: Payer: Medicare Other | Attending: Gynecologic Oncology | Admitting: Gynecologic Oncology

## 2023-03-04 ENCOUNTER — Encounter: Payer: Self-pay | Admitting: Gynecologic Oncology

## 2023-03-04 ENCOUNTER — Inpatient Hospital Stay (HOSPITAL_BASED_OUTPATIENT_CLINIC_OR_DEPARTMENT_OTHER): Payer: Medicare Other | Admitting: Gynecologic Oncology

## 2023-03-04 ENCOUNTER — Inpatient Hospital Stay: Payer: Medicare Other | Admitting: Licensed Clinical Social Worker

## 2023-03-04 VITALS — BP 146/87 | HR 83 | Temp 99.1°F | Resp 20 | Ht 69.69 in | Wt 138.4 lb

## 2023-03-04 DIAGNOSIS — N950 Postmenopausal bleeding: Secondary | ICD-10-CM

## 2023-03-04 DIAGNOSIS — R32 Unspecified urinary incontinence: Secondary | ICD-10-CM | POA: Diagnosis not present

## 2023-03-04 DIAGNOSIS — R935 Abnormal findings on diagnostic imaging of other abdominal regions, including retroperitoneum: Secondary | ICD-10-CM | POA: Diagnosis not present

## 2023-03-04 DIAGNOSIS — R634 Abnormal weight loss: Secondary | ICD-10-CM

## 2023-03-04 DIAGNOSIS — G8191 Hemiplegia, unspecified affecting right dominant side: Secondary | ICD-10-CM | POA: Diagnosis not present

## 2023-03-04 DIAGNOSIS — M6281 Muscle weakness (generalized): Secondary | ICD-10-CM | POA: Diagnosis not present

## 2023-03-04 DIAGNOSIS — N9489 Other specified conditions associated with female genital organs and menstrual cycle: Secondary | ICD-10-CM

## 2023-03-04 DIAGNOSIS — M6259 Muscle wasting and atrophy, not elsewhere classified, multiple sites: Secondary | ICD-10-CM | POA: Diagnosis not present

## 2023-03-04 DIAGNOSIS — R55 Syncope and collapse: Secondary | ICD-10-CM | POA: Diagnosis not present

## 2023-03-04 DIAGNOSIS — Z79899 Other long term (current) drug therapy: Secondary | ICD-10-CM | POA: Diagnosis not present

## 2023-03-04 DIAGNOSIS — R87619 Unspecified abnormal cytological findings in specimens from cervix uteri: Secondary | ICD-10-CM

## 2023-03-04 DIAGNOSIS — R262 Difficulty in walking, not elsewhere classified: Secondary | ICD-10-CM | POA: Diagnosis not present

## 2023-03-04 MED ORDER — SENNOSIDES-DOCUSATE SODIUM 8.6-50 MG PO TABS
2.0000 | ORAL_TABLET | Freq: Every day | ORAL | 0 refills | Status: DC
Start: 2023-03-04 — End: 2023-06-24

## 2023-03-04 MED ORDER — TRAMADOL HCL 50 MG PO TABS
50.0000 mg | ORAL_TABLET | Freq: Four times a day (QID) | ORAL | 0 refills | Status: DC | PRN
Start: 2023-03-04 — End: 2023-06-24

## 2023-03-04 NOTE — Progress Notes (Signed)
Patient here for a pre-operative appointment prior to her scheduled surgery on 04/06/2023. She is scheduled for a dilation and curettage of the uterus, robotic assisted total laparoscopic hysterectomy,bilateral salpingo-oophorectomy, sentinel lymph node biopsy, possible lymph node dissection, possible laparotomy.  The surgery was discussed in detail.  See after visit summary for additional details. Visual aids used to discuss items related to surgery including the incentive spirometer, sequential compression stockings, foley catheter, IV pump, multi-modal pain regimen including tylenol, photo of the surgical robot, female reproductive system to discuss surgery in detail.      Discussed post-op pain management in detail including the aspects of the enhanced recovery pathway.  Advised her that a new prescription would be sent in for Tramadol and it is only to be used for after her upcoming surgery.  We discussed the use of tylenol post-op and to monitor for a maximum of 4,000 mg in a 24 hour period.  Also prescribed sennakot to be used after surgery and to hold if having loose stools.  Discussed bowel regimen in detail.     Discussed the use of SCDs and measures to take at home to prevent DVT including frequent mobility.  Reportable signs and symptoms of DVT discussed. Post-operative instructions discussed and expectations for after surgery. Incisional care discussed as well including reportable signs and symptoms including erythema, drainage, wound separation.     30 minutes spent with the patient.  Verbalizing understanding of material discussed. No needs or concerns voiced at the end of the visit.   Advised patient to call for any needs.  Advised that her post-operative medications had been prescribed and could be picked up at any time.    This appointment is included in the global surgical bundle as pre-operative teaching and has no charge.

## 2023-03-04 NOTE — Progress Notes (Signed)
[facility] Clinical Social Work  Initial Assessment   Judy [name] is a 77 y.o. year old female accompanied by brother, [name], and sister-in-law, Lyn Henri. Clinical Social Work was referred by  family  for assessment of psychosocial needs.   SDOH (Social Determinants of Health) assessments performed: Yes SDOH Interventions    Flowsheet Row Clinical Support from 03/04/2023 in [facility] Most recent reading at 03/04/2023 12:54 PM Office Visit from 03/04/2023 in [facility] Gynecological Oncology Most recent reading at 03/03/2023 11:15 AM Clinical Support from 12/16/2022 in [facility] Most recent reading at 12/16/2022  2:50 PM Office Visit from 11/04/2022 in [facility] Most recent reading at 11/04/2022  2:33 PM Care Coordination from 07/01/2022 in [facility] Most recent reading at 07/01/2022  3:26 PM Office Visit from 06/28/2022 in [facility] Most recent reading at 06/28/2022  9:45 AM  SDOH Interventions        Food Insecurity Interventions Other (Comment)  [food pantry list,  receives Meals on Wheels] AMB Referral Intervention Not Indicated -- Intervention Not Indicated --  Housing Interventions -- AMB Referral Intervention Not Indicated -- Intervention Not Indicated --  Transportation Interventions Payor Benefit, Other (Comment), SCAT (Specialized Community Area Transporation), Patient Resources (Friends/Family) AMB Referral Intervention Not Indicated -- -- --  Utilities Interventions -- -- Intervention Not Indicated -- Intervention Not Indicated --  Alcohol Usage Interventions -- Intervention Not Indicated (Score <7) -- -- -- --  Depression Interventions/Treatment  -- -- Currently on Treatment Currently on Treatment  [Already being followed by Behavioral] -- Counseling, Currently on Treatment  Financial  Strain Interventions -- -- Intervention Not Indicated -- -- --  Physical Activity Interventions -- -- Intervention Not Indicated -- -- --  Stress Interventions -- -- Other (Comment)  [takes medication] -- -- --       SDOH Screenings   Food Insecurity: Food Insecurity Present (03/03/2023)  Housing: Medium Risk (03/03/2023)  Transportation Needs: Unmet Transportation Needs (03/03/2023)  Utilities: Not At Risk (12/16/2022)  Alcohol Screen: Low Risk  (05/24/2018)  Depression (PHQ2-9): Low Risk  (12/16/2022)  Recent Concern: Depression (PHQ2-9) - High Risk (11/04/2022)  Financial Resource Strain: Low Risk  (12/16/2022)  Physical Activity: Insufficiently Active (12/16/2022)  Stress: Stress Concern Present (12/16/2022)  Tobacco Use: Low Risk  (03/04/2023)     Distress Screen completed: No     No data to display            Family/Social Information:  Housing Arrangement: patient lives alone. Brother & sister-in-law live in [place] Family members/support persons in your life? Family Transportation concerns: Potentially. Brother helps when possible, but will need assistance when he/sister in law are not available  Employment: Retired.  Income source: Actor concerns: No Type of concern: None Food access concerns: yes, hard to prepare meals for self. Does have Meals on Wheels Religious or spiritual practice: Not known Services Currently in place:  Meals on Wheels, [facility] Medicare  Coping/ Adjustment to diagnosis: Patient understands treatment plan and what happens next? Figuring out surgery date and getting prepared for care post-surgery Concerns about diagnosis and/or treatment: How will I care for myself Current coping skills/ strengths: Supportive family/friends     SUMMARY: Current SDOH Barriers:  Limited assistance at home with ADLs  Clinical Social Work Clinical Goal(s):  Explore community resources to address needs related to home  support and  transportation  Interventions: Informed patient & family of the support team roles and support services at [facility] Provided CSW contact information and encouraged patient to call with any questions or concerns Referred patient to [facility] for in home aide options Information given on food pantries and transportation resources   Follow Up Plan: CSW placed referral to [facility]. Family will communicate with agency regarding in-home aide options. Family will utilize food & transportation resources provided today as needed Patient/family verbalizes understanding of plan: Yes    Natascha Edmonds E Geraldin Habermehl, LCSW Clinical Social Worker [facility]

## 2023-03-04 NOTE — Patient Instructions (Signed)
Preparing for your Surgery  Plan for surgery on April 06, 2023 with Dr. Eugene Garnet at [facility] You will be scheduled for dilation and curettage of the uterus, robotic assisted total laparoscopic hysterectomy (removal of the uterus and cervix), bilateral salpingo-oophorectomy (removal of both ovaries and fallopian tubes), sentinel lymph node biopsy, possible lymph node dissection, possible laparotomy (larger incision on your abdomen if needed).   Plan for an overnight stay in the hospital with discharge home the next day if doing well.   Pre-operative Testing -You will receive a phone call from presurgical testing at [facility] to arrange for a pre-operative appointment and lab work.  -Bring your insurance card, copy of an advanced directive if applicable, medication list  -At that visit, you will be asked to sign a consent for a possible blood transfusion in case a transfusion becomes necessary during surgery.  The need for a blood transfusion is rare but having consent is a necessary part of your care.     -You should not be taking blood thinners or aspirin at least ten days prior to surgery unless instructed by your surgeon.  -Do not take supplements such as fish oil (omega 3), red yeast rice, turmeric before your surgery. You want to avoid medications with aspirin in them including headache powders such as BC or Goody's), Excedrin migraine. STOP FISH OIL AT LEAST 10 DAYS BEFORE SURGERY  Day Before Surgery at Home -You will be asked to take in a light diet the day before surgery. You will be advised you can have clear liquids up until 3 hours before your surgery.    Eat a light diet the day before surgery.  Examples including soups, broths, toast, yogurt, mashed potatoes.  AVOID GAS PRODUCING FOODS AND BEVERAGES. Things to avoid include carbonated beverages (fizzy beverages, sodas), raw fruits and raw vegetables (uncooked), or beans.   If your bowels are filled  with gas, your surgeon will have difficulty visualizing your pelvic organs which increases your surgical risks.  Your role in recovery Your role is to become active as soon as directed by your doctor, while still giving yourself time to heal.  Rest when you feel tired. You will be asked to do the following in order to speed your recovery:  - Cough and breathe deeply. This helps to clear and expand your lungs and can prevent pneumonia after surgery.  - STAY ACTIVE WHEN YOU GET HOME. Do mild physical activity. Walking or moving your legs help your circulation and body functions return to normal. Do not try to get up or walk alone the first time after surgery.   -If you develop swelling on one leg or the other, pain in the back of your leg, redness/warmth in one of your legs, please call the office or go to the Emergency Room to have a doppler to rule out a blood clot. For shortness of breath, chest pain-seek care in the Emergency Room as soon as possible. - Actively manage your pain. Managing your pain lets you move in comfort. We will ask you to rate your pain on a scale of zero to 10. It is your responsibility to tell your doctor or nurse where and how much you hurt so your pain can be treated.  Special Considerations -If you are diabetic, you may be placed on insulin after surgery to have closer control over your blood sugars to promote healing and recovery.  This does not mean that you will be discharged on  insulin.  If applicable, your oral antidiabetics will be resumed when you are tolerating a solid diet.  -Your final pathology results from surgery should be available around one week after surgery and the results will be relayed to you when available.  -Dr. Antionette Char is the surgeon that assists your GYN Oncologist with surgery.  If you end up staying the night, the next day after your surgery you will either see Dr. Pricilla Holm, Dr. Alvester Morin, or Dr. Antionette Char.  -FMLA forms can be  faxed to [phone] and please allow 5-7 business days for completion.  Pain Management After Surgery -You have been prescribed your pain medication and bowel regimen medications before surgery so that you can have these available when you are discharged from the hospital. The pain medication is for use ONLY AFTER surgery and a new prescription will not be given.   -Make sure that you have Tylenol IF YOU ARE ABLE TO TAKE THESE MEDICATION at home to use on a regular basis after surgery for pain control.   -Review the attached handout on narcotic use and their risks and side effects.   Bowel Regimen -You have been prescribed Sennakot-S to take nightly to prevent constipation especially if you are taking the narcotic pain medication intermittently.  It is important to prevent constipation and drink adequate amounts of liquids. You can stop taking this medication when you are not taking pain medication and you are back on your normal bowel routine.  Risks of Surgery Risks of surgery are low but include bleeding, infection, damage to surrounding structures, re-operation, blood clots, and very rarely death.   Blood Transfusion Information (For the consent to be signed before surgery)  We will be checking your blood type before surgery so in case of emergencies, we will know what type of blood you would need.                                            WHAT IS A BLOOD TRANSFUSION?  A transfusion is the replacement of blood or some of its parts. Blood is made up of multiple cells which provide different functions. Red blood cells carry oxygen and are used for blood loss replacement. White blood cells fight against infection. Platelets control bleeding. Plasma helps clot blood. Other blood products are available for specialized needs, such as hemophilia or other clotting disorders. BEFORE THE TRANSFUSION  Who gives blood for transfusions?  You may be able to donate blood to be used at a later  date on yourself (autologous donation). Relatives can be asked to donate blood. This is generally not any safer than if you have received blood from a stranger. The same precautions are taken to ensure safety when a relative's blood is donated. Healthy volunteers who are fully evaluated to make sure their blood is safe. This is blood bank blood. Transfusion therapy is the safest it has ever been in the practice of medicine. Before blood is taken from a donor, a complete history is taken to make sure that person has no history of diseases nor engages in risky social behavior (examples are intravenous drug use or sexual activity with multiple partners). The donor's travel history is screened to minimize risk of transmitting infections, such as malaria. The donated blood is tested for signs of infectious diseases, such as HIV and hepatitis. The blood is then tested to be sure  it is compatible with you in order to minimize the chance of a transfusion reaction. If you or a relative donates blood, this is often done in anticipation of surgery and is not appropriate for emergency situations. It takes many days to process the donated blood. RISKS AND COMPLICATIONS Although transfusion therapy is very safe and saves many lives, the main dangers of transfusion include:  Getting an infectious disease. Developing a transfusion reaction. This is an allergic reaction to something in the blood you were given. Every precaution is taken to prevent this. The decision to have a blood transfusion has been considered carefully by your caregiver before blood is given. Blood is not given unless the benefits outweigh the risks.  AFTER SURGERY INSTRUCTIONS  Return to work: 4-6 weeks if applicable  Activity: 1. Be up and out of the bed during the day.  Take a nap if needed.  You may walk up steps but be careful and use the hand rail.  Stair climbing will tire you more than you think, you may need to stop part way and rest.    2. No lifting or straining for 6 weeks over 10 pounds. No pushing, pulling, straining for 6 weeks.  3. No driving for around 1 week(s).  Do not drive if you are taking narcotic pain medicine and make sure that your reaction time has returned.   4. You can shower as soon as the next day after surgery. Shower daily.  Use your regular soap and water (not directly on the incision) and pat your incision(s) dry afterwards; don't rub.  No tub baths or submerging your body in water until cleared by your surgeon. If you have the soap that was given to you by pre-surgical testing that was used before surgery, you do not need to use it afterwards because this can irritate your incisions.   5. No sexual activity and nothing in the vagina for 10-12 weeks.  6. You may experience a small amount of clear drainage from your incisions, which is normal.  If the drainage persists, increases, or changes color please call the office.  7. Do not use creams, lotions, or ointments such as neosporin on your incisions after surgery until advised by your surgeon because they can cause removal of the dermabond glue on your incisions.    8. You may experience vaginal spotting after surgery or around the 6-8 week mark from surgery when the stitches at the top of the vagina begin to dissolve.  The spotting is normal but if you experience heavy bleeding, call our office.  9. Take Tylenol first for pain if you are able to take these medication and only use narcotic pain medication for severe pain not relieved by the Tylenol.  Monitor your Tylenol intake to a max of 4,000 mg in a 24 hour period.   Diet: 1. Low sodium Heart Healthy Diet is recommended but you are cleared to resume your normal (before surgery) diet after your procedure.  2. It is safe to use a laxative, such as Miralax or Colace, if you have difficulty moving your bowels. You have been prescribed Sennakot-S to take at bedtime every evening after surgery to keep  bowel movements regular and to prevent constipation.    Wound Care: 1. Keep clean and dry.  Shower daily.  Reasons to call the Doctor: Fever - Oral temperature greater than 100.4 degrees Fahrenheit Foul-smelling vaginal discharge Difficulty urinating Nausea and vomiting Increased pain at the site of the incision that  is unrelieved with pain medicine. Difficulty breathing with or without chest pain New calf pain especially if only on one side Sudden, continuing increased vaginal bleeding with or without clots.   Contacts: For questions or concerns you should contact:  Dr. Eugene Garnet at [phone]  Warner Mccreedy, NP at [phone]  After Hours: call [phone] and have the GYN Oncologist paged/contacted (after 5 pm or on the weekends). You will speak with an after hours RN and let he or she know you have had surgery.  Messages sent via mychart are for non-urgent matters and are not responded to after hours so for urgent needs, please call the after hours number.

## 2023-03-08 ENCOUNTER — Ambulatory Visit (INDEPENDENT_AMBULATORY_CARE_PROVIDER_SITE_OTHER): Payer: Medicare Other | Admitting: Nurse Practitioner

## 2023-03-08 DIAGNOSIS — I10 Essential (primary) hypertension: Secondary | ICD-10-CM | POA: Diagnosis not present

## 2023-03-08 DIAGNOSIS — E782 Mixed hyperlipidemia: Secondary | ICD-10-CM | POA: Diagnosis not present

## 2023-03-08 DIAGNOSIS — F319 Bipolar disorder, unspecified: Secondary | ICD-10-CM

## 2023-03-08 DIAGNOSIS — N9489 Other specified conditions associated with female genital organs and menstrual cycle: Secondary | ICD-10-CM

## 2023-03-08 DIAGNOSIS — I700 Atherosclerosis of aorta: Secondary | ICD-10-CM | POA: Diagnosis not present

## 2023-03-08 MED ORDER — ATORVASTATIN CALCIUM 10 MG PO TABS: 10.0000 mg | ORAL_TABLET | Freq: Every day | ORAL | 1 refills | Status: DC

## 2023-03-08 NOTE — Progress Notes (Addendum)
Telephone visit   This visit type was conducted due to national recommendations for restrictions regarding the COVID-19 Pandemic (e.g. social distancing) in an effort to limit this patient's exposure and mitigate transmission in our community.  Due to her co-morbid illnesses, this patient is at least at moderate risk for complications without adequate follow up.  This format is felt to be most appropriate for this patient at this time.  All issues noted in this document were discussed and addressed.  A limited physical exam was performed with this format.    This visit type was conducted due to national recommendations for restrictions regarding the COVID-19 Pandemic (e.g. social distancing) in an effort to limit this patient's exposure and mitigate transmission in our community.  Patients identity confirmed using two different identifiers.  This format is felt to be most appropriate for this patient at this time.  All issues noted in this document were discussed and addressed.  No physical exam was performed (except for noted visual exam findings with Video Visits).    Date:  03/18/2023   ID:  Judy [name], DOB [birth year], MRN [id number]  Patient Location:  Spoke with Jodi Mourning at home  Provider location:   Office  Chief Complaint:  f/u blood pressure  History of Present Illness:    Judy [name] is a 77 y.o. female who presents via video conferencing for a telehealth visit today.    The patient does not have symptoms concerning for COVID-19 infection (fever, chills, cough, or new shortness of breath).   Patient presents Telephone for a depression follow up, continue f/u with Melony Overly. She needs to schedule a f/u appt.   She is to having surgery for an endometrial mass on 04/06/2023.   She needs help with bathing and dressing. She has not had any falls recently. She is using a walker.   Hypertension This is a chronic problem. The current episode started more than 1 year  ago. The problem is unchanged. The problem is controlled. Pertinent negatives include no chest pain, headaches or palpitations. Risk factors for coronary artery disease include sedentary lifestyle. Past treatments include calcium channel blockers. There are no compliance problems.      Past Medical History:  Diagnosis Date   Allergic rhinitis    Anemia    Anxiety    Arthritis    Bipolar 1 disorder (HCC)    Depression    Diverticulosis of colon (without mention of hemorrhage) 08/25/2011   Dr. Dulce Sellar   Hearing loss in left ear    since birth   Hyperlipidemia    Hypertension    resolved   Insomnia    Obesity    Renal disorder    Tardive dyskinesia    Tremor    Vertigo    Past Surgical History:  Procedure Laterality Date   COLONOSCOPY  08/25/2011   Procedure: COLONOSCOPY;  Surgeon: Freddy Jaksch, MD;  Location: [facility];  Service: Endoscopy;  Laterality: N/A;   DILATION AND CURETTAGE OF UTERUS       No outpatient medications have been marked as taking for the 03/08/23 encounter (Office Visit) with Arnette Felts, FNP.     Allergies:   Patient has no known allergies.   Social History   Tobacco Use   Smoking status: Never   Smokeless tobacco: Never  Vaping Use   Vaping Use: Never used  Substance Use Topics   Alcohol use: Never   Drug use: Never     Family  Hx: The patient's family history includes Hyperlipidemia in her sister; Hypertension in her brother and mother; Hypothyroidism in her sister; Kidney disease in her brother and mother; Prostate cancer in her brother and brother; Stomach cancer in her father; Stroke in her brother. There is no history of Breast cancer, Pancreatic cancer, Ovarian cancer, or Endometrial cancer.  ROS:   Please see the history of present illness.    Review of Systems  Constitutional: Negative.   HENT:  Negative for congestion.   Respiratory: Negative.  Negative for cough and wheezing.   Cardiovascular:  Negative for chest pain and  palpitations.  Gastrointestinal:  Negative for abdominal pain.  Genitourinary:  Negative for dysuria.  Neurological:  Negative for dizziness and headaches.  Psychiatric/Behavioral: Negative.      All other systems reviewed and are negative.   Labs/Other Tests and Data Reviewed:    Recent Labs: 04/28/2022: Magnesium 2.2 11/04/2022: ALT 42; BUN 12; Creatinine, Ser 1.02; Potassium 4.2; Sodium 141 11/24/2022: Hemoglobin 14.6; Platelets 263; TSH 0.995   Recent Lipid Panel Lab Results  Component Value Date/Time   CHOL 151 11/04/2022 03:14 PM   TRIG 86 11/04/2022 03:14 PM   HDL 72 11/04/2022 03:14 PM   CHOLHDL 2.1 11/04/2022 03:14 PM   CHOLHDL 3.4 03/25/2017 03:50 PM   LDLCALC 63 11/04/2022 03:14 PM    Wt Readings from Last 3 Encounters:  03/04/23 138 lb 6.4 oz (62.8 kg)  02/23/23 141 lb 12.8 oz (64.3 kg)  02/09/23 141 lb (64 kg)     Exam:    Vital Signs:  There were no vitals taken for this visit.    Physical Exam Vitals reviewed.  Constitutional:      General: She is not in acute distress.    Appearance: Normal appearance.  Cardiovascular:     Rate and Rhythm: Regular rhythm.  Pulmonary:     Effort: Pulmonary effort is normal. No respiratory distress.     Breath sounds: No wheezing.  Skin:    General: Skin is warm and dry.     Capillary Refill: Capillary refill takes less than 2 seconds.  Neurological:     General: No focal deficit present.     Mental Status: She is alert and oriented to person, place, and time.     Cranial Nerves: No cranial nerve deficit.     Motor: No weakness.     ASSESSMENT & PLAN:    1. Bipolar affective disorder, remission status unspecified (HCC) Comments: Stable, continue f/u with behavioral health  2. Aortic atherosclerosis (HCC) Comments: Chronic, LDL goal <70. She is currently on statin therapy, importance of medication/dietary compliance was stressed to the patient.  - atorvastatin (LIPITOR) 10 MG tablet; Take 1 tablet (10 mg  total) by mouth daily.  Dispense: 90 tablet; Refill: 1  3. Essential hypertension, benign Comments: Blood pressure not done this visit. virtual.  4. Mixed hyperlipidemia Comments: Continue statin, tolerating well  5. Endometrial mass Comments: She is being followed by GYN and is planning for surgical intervention   Attempted to call brother [name], no answer left VM to return call to office to get update on visit  COVID-19 Education: The signs and symptoms of COVID-19 were discussed with the patient and how to seek care for testing (follow up with PCP or arrange E-visit).  The importance of social distancing was discussed today.  Patient Risk:   After full review of this patients clinical status, I feel that they are at least moderate risk at  this time.  Time:   Today, I have spent 10 minutes/ seconds with the patient with telehealth technology discussing above diagnoses.     Medication Adjustments/Labs and Tests Ordered: Current medicines are reviewed at length with the patient today.  Concerns regarding medicines are outlined above.   Tests Ordered: No orders of the defined types were placed in this encounter.   Medication Changes: Meds ordered this encounter  Medications   atorvastatin (LIPITOR) 10 MG tablet    Sig: Take 1 tablet (10 mg total) by mouth daily.    Dispense:  90 tablet    Refill:  1    Disposition:  Follow up in 6 month(s)  Signed, Arnette Felts, FNP

## 2023-03-09 ENCOUNTER — Encounter: Payer: Self-pay | Admitting: Nurse Practitioner

## 2023-03-09 ENCOUNTER — Other Ambulatory Visit: Payer: Self-pay | Admitting: Physician Assistant

## 2023-03-09 NOTE — Telephone Encounter (Signed)
Please call to schedule, due this month and canceled.

## 2023-03-11 NOTE — Telephone Encounter (Signed)
Lvm for patient to call and schedule 

## 2023-03-14 ENCOUNTER — Telehealth: Payer: Self-pay | Admitting: *Deleted

## 2023-03-14 NOTE — Telephone Encounter (Signed)
Per Dr Tucker fax records and surgical optimization form to the patient's PCP office  °

## 2023-03-16 NOTE — Telephone Encounter (Signed)
Received PCP clearance, they recommend cardiology clearance.  Cardiology clearance

## 2023-03-23 NOTE — Patient Instructions (Signed)
DUE TO COVID-19 ONLY TWO VISITORS  (aged 76 and older)  ARE ALLOWED TO COME WITH YOU AND STAY IN THE WAITING ROOM ONLY DURING PRE OP AND PROCEDURE.   **NO VISITORS ARE ALLOWED IN THE SHORT STAY AREA OR RECOVERY ROOM!!**  IF YOU WILL BE ADMITTED INTO THE HOSPITAL YOU ARE ALLOWED ONLY FOUR SUPPORT PEOPLE DURING VISITATION HOURS ONLY (7 AM -8PM)   The support person(s) must pass our screening, gel in and out, and wear a mask at all times, including in the patient's room. Patients must also wear a mask when staff or their support person are in the room. Visitors GUEST BADGE MUST BE WORN VISIBLY  One adult visitor may remain with you overnight and MUST be in the room by 8 P.M.     Your procedure is scheduled on: 04/06/23   Report to [facility] Main Entrance    Report to admitting at : 12:15 PM   Call this number if you have problems the morning of surgery [phone]   Eat a light diet the day before surgery.  Examples including soups, broths, toast, yogurt, mashed potatoes.  Things to avoid include carbonated beverages (fizzy beverages), raw fruits and raw vegetables, or beans.   If your bowels are filled with gas, your surgeon will have difficulty visualizing your pelvic organs which increases your surgical risks.  Do not eat food :After Midnight.   After Midnight you may have the following liquids until : 11:30 AM DAY OF SURGERY  Water Black Coffee (sugar ok, NO MILK/CREAM OR CREAMERS)  Tea (sugar ok, NO MILK/CREAM OR CREAMERS) regular and decaf                             Plain Jell-O (NO RED)                                           Fruit ices (not with fruit pulp, NO RED)                                     Popsicles (NO RED)                                                                  Juice: apple, WHITE grape, WHITE cranberry Sports drinks like Gatorade (NO RED)               Oral Hygiene is also important to reduce your risk of infection.                                     Remember - BRUSH YOUR TEETH THE MORNING OF SURGERY WITH YOUR REGULAR TOOTHPASTE  DENTURES WILL BE REMOVED PRIOR TO SURGERY PLEASE DO NOT APPLY "Poly grip" OR ADHESIVES!!!   Do NOT smoke after Midnight   Take these medicines the morning of surgery with A SIP OF WATER: bupropion.Meclizine,propranolol,tylenol as needed.  You may not have any metal on your body including hair pins, jewelry, and body piercing             Do not wear make-up, lotions, powders, perfumes/cologne, or deodorant  Do not wear nail polish including gel and S&S, artificial/acrylic nails, or any other type of covering on natural nails including finger and toenails. If you have artificial nails, gel coating, etc. that needs to be removed by a nail salon please have this removed prior to surgery or surgery may need to be canceled/ delayed if the surgeon/ anesthesia feels like they are unable to be safely monitored.   Do not shave  48 hours prior to surgery.   Do not bring valuables to the hospital. [facility] IS NOT             RESPONSIBLE   FOR VALUABLES.   Contacts, glasses, or bridgework may not be worn into surgery.   Bring small overnight bag day of surgery.   DO NOT BRING YOUR HOME MEDICATIONS TO THE HOSPITAL. PHARMACY WILL DISPENSE MEDICATIONS LISTED ON YOUR MEDICATION LIST TO YOU DURING YOUR ADMISSION IN THE HOSPITAL!    Patients discharged on the day of surgery will not be allowed to drive home.  Someone NEEDS to stay with you for the first 24 hours after anesthesia.   Special Instructions: Bring a copy of your healthcare power of attorney and living will documents         the day of surgery if you haven't scanned them before.              Please read over the following fact sheets you were given: IF YOU HAVE QUESTIONS ABOUT YOUR PRE-OP INSTRUCTIONS PLEASE CALL [phone]    [facility] - Preparing for Surgery Before surgery, you can play an important role.  Because  skin is not sterile, your skin needs to be as free of germs as possible.  You can reduce the number of germs on your skin by washing with CHG (chlorahexidine gluconate) soap before surgery.  CHG is an antiseptic cleaner which kills germs and bonds with the skin to continue killing germs even after washing. Please DO NOT use if you have an allergy to CHG or antibacterial soaps.  If your skin becomes reddened/irritated stop using the CHG and inform your nurse when you arrive at Short Stay. Do not shave (including legs and underarms) for at least 48 hours prior to the first CHG shower.  You may shave your face/neck. Please follow these instructions carefully:  1.  Shower with CHG Soap the night before surgery and the  morning of Surgery.  2.  If you choose to wash your hair, wash your hair first as usual with your  normal  shampoo.  3.  After you shampoo, rinse your hair and body thoroughly to remove the  shampoo.                           4.  Use CHG as you would any other liquid soap.  You can apply chg directly  to the skin and wash                       Gently with a scrungie or clean washcloth.  5.  Apply the CHG Soap to your body ONLY FROM THE NECK DOWN.   Do not use on face/ open  Wound or open sores. Avoid contact with eyes, ears mouth and genitals (private parts).                       Wash face,  Genitals (private parts) with your normal soap.             6.  Wash thoroughly, paying special attention to the area where your surgery  will be performed.  7.  Thoroughly rinse your body with warm water from the neck down.  8.  DO NOT shower/wash with your normal soap after using and rinsing off  the CHG Soap.                9.  Pat yourself dry with a clean towel.            10.  Wear clean pajamas.            11.  Place clean sheets on your bed the night of your first shower and do not  sleep with pets. Day of Surgery : Do not apply any lotions/deodorants the morning of  surgery.  Please wear clean clothes to the hospital/surgery center.  FAILURE TO FOLLOW THESE INSTRUCTIONS MAY RESULT IN THE CANCELLATION OF YOUR SURGERY PATIENT SIGNATURE_________________________________  NURSE SIGNATURE__________________________________  ________________________________________________________________________ WHAT IS A BLOOD TRANSFUSION? Blood Transfusion Information  A transfusion is the replacement of blood or some of its parts. Blood is made up of multiple cells which provide different functions. Red blood cells carry oxygen and are used for blood loss replacement. White blood cells fight against infection. Platelets control bleeding. Plasma helps clot blood. Other blood products are available for specialized needs, such as hemophilia or other clotting disorders. BEFORE THE TRANSFUSION  Who gives blood for transfusions?  Healthy volunteers who are fully evaluated to make sure their blood is safe. This is blood bank blood. Transfusion therapy is the safest it has ever been in the practice of medicine. Before blood is taken from a donor, a complete history is taken to make sure that person has no history of diseases nor engages in risky social behavior (examples are intravenous drug use or sexual activity with multiple partners). The donor's travel history is screened to minimize risk of transmitting infections, such as malaria. The donated blood is tested for signs of infectious diseases, such as HIV and hepatitis. The blood is then tested to be sure it is compatible with you in order to minimize the chance of a transfusion reaction. If you or a relative donates blood, this is often done in anticipation of surgery and is not appropriate for emergency situations. It takes many days to process the donated blood. RISKS AND COMPLICATIONS Although transfusion therapy is very safe and saves many lives, the main dangers of transfusion include:  Getting an infectious  disease. Developing a transfusion reaction. This is an allergic reaction to something in the blood you were given. Every precaution is taken to prevent this. The decision to have a blood transfusion has been considered carefully by your caregiver before blood is given. Blood is not given unless the benefits outweigh the risks. AFTER THE TRANSFUSION Right after receiving a blood transfusion, you will usually feel much better and more energetic. This is especially true if your red blood cells have gotten low (anemic). The transfusion raises the level of the red blood cells which carry oxygen, and this usually causes an energy increase. The nurse administering the transfusion will monitor you carefully for complications.  HOME CARE INSTRUCTIONS  No special instructions are needed after a transfusion. You may find your energy is better. Speak with your caregiver about any limitations on activity for underlying diseases you may have. SEEK MEDICAL CARE IF:  Your condition is not improving after your transfusion. You develop redness or irritation at the intravenous (IV) site. SEEK IMMEDIATE MEDICAL CARE IF:  Any of the following symptoms occur over the next 12 hours: Shaking chills. You have a temperature by mouth above 102 F (38.9 C), not controlled by medicine. Chest, back, or muscle pain. People around you feel you are not acting correctly or are confused. Shortness of breath or difficulty breathing. Dizziness and fainting. You get a rash or develop hives. You have a decrease in urine output. Your urine turns a dark color or changes to pink, red, or brown. Any of the following symptoms occur over the next 10 days: You have a temperature by mouth above 102 F (38.9 C), not controlled by medicine. Shortness of breath. Weakness after normal activity. The white part of the eye turns yellow (jaundice). You have a decrease in the amount of urine or are urinating less often. Your urine turns a  dark color or changes to pink, red, or brown. Document Released: 09/24/2000 Document Revised: 12/20/2011 Document Reviewed: 05/13/2008 [facility] Patient Information 2014 [place], [place].  _______________________________________________________________________

## 2023-03-24 ENCOUNTER — Other Ambulatory Visit: Payer: Self-pay

## 2023-03-24 ENCOUNTER — Encounter (HOSPITAL_COMMUNITY)
Admission: RE | Admit: 2023-03-24 | Discharge: 2023-03-24 | Disposition: A | Discharge status: Home / Self Care | Payer: Medicare Other | Source: Ambulatory Visit | Attending: Gynecologic Oncology | Admitting: Gynecologic Oncology

## 2023-03-24 ENCOUNTER — Encounter (HOSPITAL_COMMUNITY): Payer: Self-pay

## 2023-03-24 DIAGNOSIS — I10 Essential (primary) hypertension: Secondary | ICD-10-CM | POA: Diagnosis not present

## 2023-03-24 DIAGNOSIS — Z01818 Encounter for other preprocedural examination: Secondary | ICD-10-CM | POA: Diagnosis not present

## 2023-03-24 DIAGNOSIS — N9489 Other specified conditions associated with female genital organs and menstrual cycle: Secondary | ICD-10-CM | POA: Insufficient documentation

## 2023-03-24 DIAGNOSIS — R87619 Unspecified abnormal cytological findings in specimens from cervix uteri: Secondary | ICD-10-CM | POA: Diagnosis not present

## 2023-03-24 DIAGNOSIS — R9431 Abnormal electrocardiogram [ECG] [EKG]: Secondary | ICD-10-CM | POA: Insufficient documentation

## 2023-03-24 HISTORY — DX: Dyspnea, unspecified: R06.00

## 2023-03-24 HISTORY — DX: Palpitations: R00.2

## 2023-03-24 HISTORY — DX: Malignant (primary) neoplasm, unspecified: C80.1

## 2023-03-24 LAB — COMPREHENSIVE METABOLIC PANEL
ALT: 21 U/L (ref 0–44)
AST: 24 U/L (ref 15–41)
Albumin: 4 g/dL (ref 3.5–5.0)
Alkaline Phosphatase: 46 U/L (ref 38–126)
Anion gap: 8 (ref 5–15)
BUN: 16 mg/dL (ref 8–23)
CO2: 27 mmol/L (ref 22–32)
Calcium: 9.6 mg/dL (ref 8.9–10.3)
Chloride: 107 mmol/L (ref 98–111)
Creatinine, Ser: 0.95 mg/dL (ref 0.44–1.00)
GFR, Estimated: >60 mL/min (ref >60)
Glucose, Bld: 108 mg/dL — ABNORMAL HIGH (ref 70–99)
Potassium: 4 mmol/L (ref 3.5–5.1)
Sodium: 142 mmol/L (ref 135–145)
Total Bilirubin: 0.5 mg/dL (ref 0.3–1.2)
Total Protein: 7 g/dL (ref 6.5–8.1)

## 2023-03-24 LAB — CBC
HCT: 42.9 % (ref 36.0–46.0)
Hemoglobin: 14.2 g/dL (ref 12.0–15.0)
MCH: 31.4 pg (ref 26.0–34.0)
MCHC: 33.1 g/dL (ref 30.0–36.0)
MCV: 94.9 fL (ref 80.0–100.0)
Platelets: 237 10*3/uL (ref 150–400)
RBC: 4.52 MIL/uL (ref 3.87–5.11)
RDW: 13.9 % (ref 11.5–15.5)
WBC: 5.3 10*3/uL (ref 4.0–10.5)
nRBC: 0 % (ref 0.0–0.2)

## 2023-03-24 LAB — TYPE AND SCREEN
ABO/RH(D): A POS
Antibody Screen: NEGATIVE

## 2023-03-24 NOTE — Progress Notes (Addendum)
For Short Stay: COVID SWAB appointment date:  Bowel Prep reminder:   For Anesthesia: PCP - Arnette Felts: FNP. LOV: 03/08/23 Cardiologist - DO: Rozell Searing Custovic. LOV: 11/08/22  Chest x-ray - Chest CT: 07/16/22 EKG - 07/02/22 Stress Test -  ECHO - 04/21/22 Cardiac Cath -  Pacemaker/ICD device last checked: Pacemaker orders received: Device Rep notified:  Spinal Cord Stimulator: N/A  Sleep Study - N/A CPAP -   Fasting Blood Sugar - N/A Checks Blood Sugar _____ times a day Date and result of last Hgb A1c-  Last dose of GLP1 agonist- N/A GLP1 instructions:   Last dose of SGLT-2 inhibitors- N/A SGLT-2 instructions:   Blood Thinner Instructions: N/A Aspirin Instructions: Last Dose:  Activity level: Can go up a flight of stairs and activities of daily living without stopping and without chest pain and/or shortness of breath   Able to exercise without chest pain and/or shortness of breath   Unable to go up a flight of stairs without  shortness of breath    Anesthesia review: Hx: HTN,palpitation,SOB with exertion,pericardial effusion,PE,syncope(2021).  Patient denies shortness of breath, fever, cough and chest pain at PAT appointment   Patient verbalized understanding of instructions that were given to them at the PAT appointment. Patient was also instructed that they will need to review over the PAT instructions again at home before surgery.

## 2023-03-28 ENCOUNTER — Telehealth: Payer: Self-pay

## 2023-03-28 NOTE — Telephone Encounter (Signed)
Patients brother [name] called regarding patient, patient was called back but didn't answer a VM was left.

## 2023-03-29 DIAGNOSIS — J449 Chronic obstructive pulmonary disease, unspecified: Secondary | ICD-10-CM | POA: Diagnosis not present

## 2023-03-29 DIAGNOSIS — R0609 Other forms of dyspnea: Secondary | ICD-10-CM | POA: Diagnosis not present

## 2023-03-30 ENCOUNTER — Ambulatory Visit: Payer: Medicare Other | Admitting: Cardiology

## 2023-03-30 ENCOUNTER — Encounter: Payer: Self-pay | Admitting: Cardiology

## 2023-03-30 VITALS — BP 137/73 | HR 66 | Ht 69.0 in | Wt 146.6 lb

## 2023-03-30 DIAGNOSIS — Z01810 Encounter for preprocedural cardiovascular examination: Secondary | ICD-10-CM

## 2023-03-30 DIAGNOSIS — R9439 Abnormal result of other cardiovascular function study: Secondary | ICD-10-CM

## 2023-03-30 DIAGNOSIS — I10 Essential (primary) hypertension: Secondary | ICD-10-CM

## 2023-03-30 NOTE — Progress Notes (Signed)
Follow up visit  Subjective:   Judy [name], female    DOB: 1946/02/25, 77 y.o.   MRN: [id number]   HPI  Chief Complaint  Patient presents with   Medical Clearance   Hypertension    77 y.o. African American female with hypertension, h/o PE (2021), exertional dyspnea, abnormal stress test  Patient is here today with her sister in law.  Patient is currently scheduled to undergo dilatation curettage of the uterus due to endometrial mass, by Dr. Eugene Garnet.    She was previously seen by exertional dyspnea with mild abnormality on stress tests.  She has never had any chest pain.  Her exertional dyspnea has improved over period of time.       Current Outpatient Medications:    acetaminophen (TYLENOL) 325 MG tablet, Take 2 tablets (650 mg total) by mouth every 6 (six) hours as needed for mild pain (or Fever >/= 101)., Disp: , Rfl:    albuterol (VENTOLIN HFA) 108 (90 Base) MCG/ACT inhaler, Inhale 2 puffs into the lungs every 6 (six) hours as needed for wheezing or shortness of breath. Use with spacer, Disp: 8 g, Rfl: 2   atorvastatin (LIPITOR) 10 MG tablet, Take 1 tablet (10 mg total) by mouth daily., Disp: 90 tablet, Rfl: 1   b complex vitamins capsule, Take 1 capsule by mouth daily. With C, Disp: , Rfl:    buPROPion (WELLBUTRIN XL) 150 MG 24 hr tablet, Take 1 tablet (150 mg total) by mouth daily., Disp: 90 tablet, Rfl: 0   cholecalciferol (VITAMIN D3) 25 MCG (1000 UNIT) tablet, Take 1,000 Units by mouth daily., Disp: , Rfl:    INGREZZA 80 MG capsule, Take 1 capsule (80 mg total) by mouth daily., Disp: 30 capsule, Rfl: 2   ipratropium-albuterol (DUONEB) 0.5-2.5 (3) MG/3ML SOLN, 1 treatment in the morning, 1 treatment in the evenings, every 6 hours as needed in between for shortness of breath or wheezing, Disp: 360 mL, Rfl: 3   meclizine (ANTIVERT) 12.5 MG tablet, Take 1 tablet (12.5 mg total) by mouth 3 (three) times daily as needed for dizziness., Disp: 30 tablet, Rfl: 0    megestrol (MEGACE) 40 MG tablet, Take 2 tablets (80 mg total) by mouth daily. (Patient taking differently: Take 40 mg by mouth daily.), Disp: 60 tablet, Rfl: 2   mirtazapine (REMERON) 7.5 MG tablet, TAKE 0.5-1 TABLETS (3.75-7.5 MG TOTAL) BY MOUTH AT BEDTIME AS NEEDED., Disp: 30 tablet, Rfl: 0   Omega-3 Fatty Acids (FISH OIL BURP-LESS PO), Take 700 mg by mouth daily., Disp: , Rfl:    polyethylene glycol (MIRALAX / GLYCOLAX) 17 g packet, Take 17 g by mouth daily. (Patient taking differently: Take 17 g by mouth daily as needed for moderate constipation or mild constipation.), Disp: 30 each, Rfl: 2   propranolol (INDERAL) 10 MG tablet, Take 1 tablet (10 mg total) by mouth 3 (three) times daily as needed. (Patient taking differently: Take 10 mg by mouth 3 (three) times daily as needed (unknown).), Disp: 90 tablet, Rfl: 6   senna-docusate (SENOKOT-S) 8.6-50 MG tablet, Take 2 tablets by mouth at bedtime. For AFTER surgery, do not take if having diarrhea, Disp: 30 tablet, Rfl: 0   traMADol (ULTRAM) 50 MG tablet, Take 1 tablet (50 mg total) by mouth every 6 (six) hours as needed for severe pain. For AFTER surgery only, do not take and drive, Disp: 10 tablet, Rfl: 0   Cardiovascular & other pertient studies:  Reviewed external labs and tests, independently  interpreted  EKG 03/30/2023: Sinus rhythm 64 bpm with sinus arrhtymia Possible old anteroseptal infarct Nonspecific T-abnormality  Lexiscan Nuclear stress test 07/19/2022: Myocardial perfusion is abnormal. There is a fixed mild defect in the inferior and septal regions. There is a partially reversible mild defect in the apical region.  Overall LV systolic function is abnormal without regional wall motion abnormalities. Mild global hypokinesis of the left ventricle. Stress LV EF is mildly dysfunctional 45%.  Non-diagnostic ECG stress. The heart rate response was consistent with Regadenoson.  Report only comparison from 10/19/2016 reviewed similar inferior  and inferolateral defect with partial reversibility with EF 60%. Low risk.    Echocardiogram 04/21/2022: 1. Left ventricular ejection fraction, by estimation, is 60 to 65%. The  left ventricle has normal function. The left ventricle has no regional  wall motion abnormalities. There is moderate concentric left ventricular  hypertrophy. Left ventricular  diastolic parameters are consistent with Grade I diastolic dysfunction  (impaired relaxation).   2. Right ventricular systolic function is normal. The right ventricular  size is normal. There is normal pulmonary artery systolic pressure. The  estimated right ventricular systolic pressure is 29.8 mmHg.   3. The mitral valve is normal in structure. Trivial mitral valve  regurgitation. No evidence of mitral stenosis.   4. The aortic valve is normal in structure. Aortic valve regurgitation is  not visualized. No aortic stenosis is present.   5. The inferior vena cava is normal in size with greater than 50%  respiratory variability, suggesting right atrial pressure of 3 mmHg.    Recent labs: 03/24/2023: Glucose 108, BUN/Cr 16/0.95. EGFR >60. Na/K 142/4.0. Rest of the CMP normal H/H 14/42. MCV 94. Platelets 237  10/2022: HbA1C 5.6% Chol 151, TG 86, HDL 72, LDL 63 TSH 0.9 normal    Review of Systems  Cardiovascular:  Positive for dyspnea on exertion (Improved). Negative for chest pain, leg swelling, palpitations and syncope.         Vitals:   03/30/23 1256  BP: 137/73  Pulse: 66  SpO2: 98%    Body mass index is 21.65 kg/m. Filed Weights   03/30/23 1256  Weight: 146 lb 9.6 oz (66.5 kg)     Objective:   Physical Exam Vitals and nursing note reviewed.  Constitutional:      General: She is not in acute distress. Neck:     Vascular: No JVD.  Cardiovascular:     Rate and Rhythm: Normal rate and regular rhythm.     Heart sounds: Normal heart sounds. No murmur heard. Pulmonary:     Effort: Pulmonary effort is normal.      Breath sounds: Normal breath sounds. No wheezing or rales.  Musculoskeletal:     Right lower leg: No edema.     Left lower leg: No edema.             Visit diagnoses:   ICD-10-CM   1. Essential hypertension  I10     2. Preop cardiovascular exam  Z01.810 EKG 12-Lead    3. Abnormal stress test  R94.39        Orders Placed This Encounter  Procedures   EKG 12-Lead     Assessment & Recommendations:   77 y.o. African American female with hypertension, h/o PE (2021), exertional dyspnea, abnormal stress test  Peri operative cardiac risk stratification: Prior abnormal stress test, that was medically managed in absence of anginal symptoms, or high risk findings on stress test.  Her dyspnea symptoms have in fact improved  over period of time.  With time sensitive surgery for endometrial mass scheduled for 04/06/2023, I do not think coronary angiography at this time will mitigate that risk.  I do not think she has severe multivessel disease that would preclude her from having surgery as scheduled.  Overall, while her perioperative cardiac risk is moderate, I think is reasonable to proceed as planned with dilatation and curettage and other surgical procedures on 04/06/2023.  Continue statin.  F/u w/me in 3 months      Elder Negus, MD Pager: [phone] Office: [phone]

## 2023-03-31 NOTE — Progress Notes (Signed)
Anesthesia Chart Review   Case: [id number] Date/Time: 04/06/23 1415   Procedures:      DILATATION AND CURETTAGE     XI ROBOTIC ASSISTED TOTAL HYSTERECTOMY WITH BILATERAL SALPINGO OOPHORECTOMY     SENTINEL NODE BIOPSY     POSSIBLE LYMPH NODE DISSECTION AND POSSIBLE LAPAROTOMY   Anesthesia type: General   Pre-op diagnosis: ATYPICAL GLANDULAR CELLS SUSPICIOUS FOR ENDOMETRIAL CANCER   Location: [facility] ROOM 05 / [facility]   Surgeons: Carver Fila, MD       DISCUSSION:77 y.o. never smoker with h/o HTN, atypical glandular cells suspicious for endometrial cancer scheduled for above procedure 04/06/2023 with Dr. Eugene Garnet.   Pt seen by cardiology 03/30/2023. Per OV note, "Peri operative cardiac risk stratification: Prior abnormal stress test, that was medically managed in absence of anginal symptoms, or high risk findings on stress test.  Her dyspnea symptoms have in fact improved over period of time.  With time sensitive surgery for endometrial mass scheduled for 04/06/2023, I do not think coronary angiography at this time will mitigate that risk.  I do not think she has severe multivessel disease that would preclude her from having surgery as scheduled.  Overall, while her perioperative cardiac risk is moderate, I think is reasonable to proceed as planned with dilatation and curettage and other surgical procedures on 04/06/2023.  Continue statin."  VS: BP 138/68   Pulse 67   Temp 37.1 C (Oral)   Ht 5\' 9"  (1.753 m)   Wt 60.3 kg   SpO2 100%   BMI 19.64 kg/m   PROVIDERS: Arnette Felts, FNP is PCP    LABS: Labs reviewed: Acceptable for surgery. (all labs ordered are listed, but only abnormal results are displayed)  Labs Reviewed  COMPREHENSIVE METABOLIC PANEL - Abnormal; Notable for the following components:      Result Value   Glucose, Bld 108 (*)    All other components within normal limits  CBC  TYPE AND SCREEN     IMAGES:   EKG:   CV: Lexiscan Nuclear stress test  07/19/2022: Myocardial perfusion is abnormal. There is a fixed mild defect in the inferior and septal regions. There is a partially reversible mild defect in the apical region.  Overall LV systolic function is abnormal without regional wall motion abnormalities. Mild global hypokinesis of the left ventricle. Stress LV EF is mildly dysfunctional 45%.  Non-diagnostic ECG stress. The heart rate response was consistent with Regadenoson.  Report only comparison from 10/19/2016 reviewed similar inferior and inferolateral defect with partial reversibility with EF 60%. Low risk.   Echo 04/21/2022  1. Left ventricular ejection fraction, by estimation, is 60 to 65%. The  left ventricle has normal function. The left ventricle has no regional  wall motion abnormalities. There is moderate concentric left ventricular  hypertrophy. Left ventricular  diastolic parameters are consistent with Grade I diastolic dysfunction  (impaired relaxation).   2. Right ventricular systolic function is normal. The right ventricular  size is normal. There is normal pulmonary artery systolic pressure. The  estimated right ventricular systolic pressure is 29.8 mmHg.   3. The mitral valve is normal in structure. Trivial mitral valve  regurgitation. No evidence of mitral stenosis.   4. The aortic valve is normal in structure. Aortic valve regurgitation is  not visualized. No aortic stenosis is present.   5. The inferior vena cava is normal in size with greater than 50%  respiratory variability, suggesting right atrial pressure of 3 mmHg.  Past Medical History:  Diagnosis Date   Allergic rhinitis    Anemia    Anxiety    Arthritis    Bipolar 1 disorder (HCC)    Cancer (HCC)    Depression    Diverticulosis of colon (without mention of hemorrhage) 08/25/2011   Dr. Dulce Sellar   Dyspnea    Hearing loss in left ear    since birth   Hyperlipidemia    Hypertension    resolved   Insomnia    Obesity    Palpitation    Renal  disorder    Tardive dyskinesia    Tremor    Vertigo     Past Surgical History:  Procedure Laterality Date   COLONOSCOPY  08/25/2011   Procedure: COLONOSCOPY;  Surgeon: Freddy Jaksch, MD;  Location: [facility];  Service: Endoscopy;  Laterality: N/A;   DILATION AND CURETTAGE OF UTERUS      MEDICATIONS:  acetaminophen (TYLENOL) 325 MG tablet   albuterol (VENTOLIN HFA) 108 (90 Base) MCG/ACT inhaler   atorvastatin (LIPITOR) 10 MG tablet   b complex vitamins capsule   buPROPion (WELLBUTRIN XL) 150 MG 24 hr tablet   cholecalciferol (VITAMIN D3) 25 MCG (1000 UNIT) tablet   INGREZZA 80 MG capsule   ipratropium-albuterol (DUONEB) 0.5-2.5 (3) MG/3ML SOLN   meclizine (ANTIVERT) 12.5 MG tablet   megestrol (MEGACE) 40 MG tablet   mirtazapine (REMERON) 7.5 MG tablet   Omega-3 Fatty Acids (FISH OIL BURP-LESS PO)   polyethylene glycol (MIRALAX / GLYCOLAX) 17 g packet   propranolol (INDERAL) 10 MG tablet   SENNA PLUS 50-8.6 MG CAPS   senna-docusate (SENOKOT-S) 8.6-50 MG tablet   traMADol (ULTRAM) 50 MG tablet   No current facility-administered medications for this encounter.    Jodell Cipro Ward, PA-C [facility] [phone]

## 2023-03-31 NOTE — Anesthesia Preprocedure Evaluation (Addendum)
Anesthesia Evaluation  Patient identified by MRN, date of birth, ID band Patient awake    Reviewed: Allergy & Precautions, H&P , NPO status , Patient's Chart, lab work & pertinent test results  Airway Mallampati: III  TM Distance: >3 FB Neck ROM: Full    Dental no notable dental hx.    Pulmonary neg pulmonary ROS   Pulmonary exam normal breath sounds clear to auscultation       Cardiovascular hypertension, Normal cardiovascular exam Rhythm:Regular Rate:Normal     Neuro/Psych negative neurological ROS  negative psych ROS   GI/Hepatic Neg liver ROS,GERD  ,,  Endo/Other  negative endocrine ROS    Renal/GU negative Renal ROS  negative genitourinary   Musculoskeletal negative musculoskeletal ROS (+)    Abdominal   Peds negative pediatric ROS (+)  Hematology negative hematology ROS (+)   Anesthesia Other Findings   Reproductive/Obstetrics negative OB ROS                             Anesthesia Physical Anesthesia Plan  ASA: 2  Anesthesia Plan: General   Post-op Pain Management: Tylenol PO (pre-op)*   Induction: Intravenous  PONV Risk Score and Plan: 3 and Ondansetron, Dexamethasone and Treatment may vary due to age or medical condition  Airway Management Planned: Oral ETT  Additional Equipment:   Intra-op Plan:   Post-operative Plan: Extubation in OR  Informed Consent: I have reviewed the patients History and Physical, chart, labs and discussed the procedure including the risks, benefits and alternatives for the proposed anesthesia with the patient or authorized representative who has indicated his/her understanding and acceptance.     Dental advisory given  Plan Discussed with: CRNA and Surgeon  Anesthesia Plan Comments: (See PAT note 03/24/2023)       Anesthesia Quick Evaluation

## 2023-04-05 ENCOUNTER — Telehealth: Payer: Self-pay | Admitting: *Deleted

## 2023-04-05 NOTE — Telephone Encounter (Signed)
Telephone call to check on pre-operative status. Spoke with Pt's brother [name] who will be bringing patient to hospital tomorrow. Compliant with pre-operative instructions.  Reinforced nothing to eat after midnight. Clear liquids until 11:15. Patient to arrive at 12:15.  No questions or concerns voiced.  Instructed to call for any needs.

## 2023-04-06 ENCOUNTER — Ambulatory Visit (HOSPITAL_BASED_OUTPATIENT_CLINIC_OR_DEPARTMENT_OTHER): Payer: Medicare Other | Admitting: Certified Registered Nurse Anesthetist

## 2023-04-06 ENCOUNTER — Ambulatory Visit (HOSPITAL_COMMUNITY): Payer: Medicare Other | Admitting: Physician Assistant

## 2023-04-06 ENCOUNTER — Ambulatory Visit (HOSPITAL_COMMUNITY)
Admission: RE | Admit: 2023-04-06 | Discharge: 2023-04-08 | Disposition: A | Discharge status: Home / Self Care | Payer: Medicare Other | Source: Ambulatory Visit | Attending: Obstetrics & Gynecology | Admitting: Obstetrics & Gynecology

## 2023-04-06 ENCOUNTER — Encounter (HOSPITAL_COMMUNITY)
Admission: RE | Disposition: A | Discharge status: Home / Self Care | Payer: Self-pay | Source: Ambulatory Visit | Attending: Obstetrics & Gynecology

## 2023-04-06 ENCOUNTER — Encounter (HOSPITAL_COMMUNITY): Payer: Self-pay | Admitting: Gynecologic Oncology

## 2023-04-06 ENCOUNTER — Other Ambulatory Visit: Payer: Self-pay

## 2023-04-06 DIAGNOSIS — C541 Malignant neoplasm of endometrium: Secondary | ICD-10-CM

## 2023-04-06 DIAGNOSIS — N9489 Other specified conditions associated with female genital organs and menstrual cycle: Secondary | ICD-10-CM

## 2023-04-06 DIAGNOSIS — D360 Benign neoplasm of lymph nodes: Secondary | ICD-10-CM | POA: Diagnosis not present

## 2023-04-06 DIAGNOSIS — N80329 Endometriosis of the posterior cul-de-sac, unspecified depth: Secondary | ICD-10-CM | POA: Diagnosis not present

## 2023-04-06 DIAGNOSIS — R9389 Abnormal findings on diagnostic imaging of other specified body structures: Secondary | ICD-10-CM | POA: Insufficient documentation

## 2023-04-06 DIAGNOSIS — I10 Essential (primary) hypertension: Secondary | ICD-10-CM | POA: Diagnosis not present

## 2023-04-06 DIAGNOSIS — R87619 Unspecified abnormal cytological findings in specimens from cervix uteri: Secondary | ICD-10-CM

## 2023-04-06 DIAGNOSIS — D259 Leiomyoma of uterus, unspecified: Secondary | ICD-10-CM | POA: Diagnosis not present

## 2023-04-06 DIAGNOSIS — Z5982 Transportation insecurity: Secondary | ICD-10-CM | POA: Insufficient documentation

## 2023-04-06 DIAGNOSIS — N950 Postmenopausal bleeding: Secondary | ICD-10-CM

## 2023-04-06 DIAGNOSIS — K660 Peritoneal adhesions (postprocedural) (postinfection): Secondary | ICD-10-CM | POA: Diagnosis not present

## 2023-04-06 DIAGNOSIS — R9431 Abnormal electrocardiogram [ECG] [EKG]: Secondary | ICD-10-CM

## 2023-04-06 HISTORY — PX: LYMPH NODE DISSECTION: SHX5087

## 2023-04-06 HISTORY — DX: Malignant neoplasm of endometrium: C54.1

## 2023-04-06 HISTORY — PX: SENTINEL NODE BIOPSY: SHX6608

## 2023-04-06 HISTORY — PX: ROBOTIC ASSISTED TOTAL HYSTERECTOMY WITH BILATERAL SALPINGO OOPHERECTOMY: SHX6086

## 2023-04-06 HISTORY — PX: DILATION AND CURETTAGE OF UTERUS: SHX78

## 2023-04-06 LAB — ABO/RH: ABO/RH(D): A POS

## 2023-04-06 SURGERY — DILATION AND CURETTAGE
Anesthesia: General

## 2023-04-06 MED ORDER — LIDOCAINE 20MG/ML (2%) 15 ML SYRINGE OPTIME
INTRAMUSCULAR | Status: DC | PRN
  Administered 2023-04-06: 1.5 mg/kg/h via INTRAVENOUS

## 2023-04-06 MED ORDER — ONDANSETRON HCL 4 MG/2ML IJ SOLN
4.0000 mg | Freq: Four times a day (QID) | INTRAMUSCULAR | Status: DC | PRN
  Administered 2023-04-06 – 2023-04-07 (×2): 4 mg via INTRAVENOUS
  Filled 2023-04-06 (×2): qty 2

## 2023-04-06 MED ORDER — MIDAZOLAM HCL 5 MG/5ML IJ SOLN
INTRAMUSCULAR | Status: DC | PRN
  Administered 2023-04-06: 1 mg via INTRAVENOUS

## 2023-04-06 MED ORDER — LIDOCAINE 2% (20 MG/ML) 5 ML SYRINGE
INTRAMUSCULAR | Status: DC | PRN
  Administered 2023-04-06: 60 mg via INTRAVENOUS

## 2023-04-06 MED ORDER — LIDOCAINE HCL 2 % IJ SOLN
INTRAMUSCULAR | Status: AC
  Filled 2023-04-06: qty 20

## 2023-04-06 MED ORDER — STERILE WATER FOR IRRIGATION IR SOLN
Status: DC | PRN
  Administered 2023-04-06: 1000 mL

## 2023-04-06 MED ORDER — IPRATROPIUM-ALBUTEROL 0.5-2.5 (3) MG/3ML IN SOLN: 3.0000 mL | Freq: Four times a day (QID) | RESPIRATORY_TRACT | Status: DC | PRN

## 2023-04-06 MED ORDER — HEPARIN SODIUM (PORCINE) 5000 UNIT/ML IJ SOLN
5000.0000 [IU] | INTRAMUSCULAR | Status: AC
  Administered 2023-04-06: 5000 [IU] via SUBCUTANEOUS
  Filled 2023-04-06: qty 1

## 2023-04-06 MED ORDER — ONDANSETRON HCL 4 MG/2ML IJ SOLN
INTRAMUSCULAR | Status: DC | PRN
  Administered 2023-04-06: 4 mg via INTRAVENOUS

## 2023-04-06 MED ORDER — FENTANYL CITRATE (PF) 100 MCG/2ML IJ SOLN
INTRAMUSCULAR | Status: DC | PRN
  Administered 2023-04-06 (×2): 50 ug via INTRAVENOUS

## 2023-04-06 MED ORDER — TRAMADOL HCL 50 MG PO TABS
100.0000 mg | ORAL_TABLET | Freq: Two times a day (BID) | ORAL | Status: DC | PRN
  Filled 2023-04-06: qty 2

## 2023-04-06 MED ORDER — SENNOSIDES-DOCUSATE SODIUM 8.6-50 MG PO TABS
2.0000 | ORAL_TABLET | Freq: Every day | ORAL | Status: DC
  Administered 2023-04-06 – 2023-04-07 (×2): 2 via ORAL
  Filled 2023-04-06 (×2): qty 2

## 2023-04-06 MED ORDER — BUPIVACAINE LIPOSOME 1.3 % IJ SUSP
INTRAMUSCULAR | Status: DC | PRN
  Administered 2023-04-06: 50 mL

## 2023-04-06 MED ORDER — VALBENAZINE TOSYLATE 40 MG PO CAPS
80.0000 mg | ORAL_CAPSULE | Freq: Every day | ORAL | Status: DC
  Administered 2023-04-07 – 2023-04-08 (×2): 80 mg via ORAL
  Filled 2023-04-06 (×2): qty 2

## 2023-04-06 MED ORDER — BUPIVACAINE HCL 0.25 % IJ SOLN
INTRAMUSCULAR | Status: AC
  Filled 2023-04-06: qty 1

## 2023-04-06 MED ORDER — FENTANYL CITRATE (PF) 100 MCG/2ML IJ SOLN
INTRAMUSCULAR | Status: AC
  Filled 2023-04-06: qty 2

## 2023-04-06 MED ORDER — DEXAMETHASONE SODIUM PHOSPHATE 10 MG/ML IJ SOLN
INTRAMUSCULAR | Status: AC
  Filled 2023-04-06: qty 1

## 2023-04-06 MED ORDER — EPHEDRINE 5 MG/ML INJ
INTRAVENOUS | Status: AC
  Filled 2023-04-06: qty 5

## 2023-04-06 MED ORDER — ALBUTEROL SULFATE (2.5 MG/3ML) 0.083% IN NEBU: 3.0000 mL | INHALATION_SOLUTION | Freq: Four times a day (QID) | RESPIRATORY_TRACT | Status: DC | PRN

## 2023-04-06 MED ORDER — STERILE WATER FOR INJECTION IJ SOLN
INTRAMUSCULAR | Status: AC
  Filled 2023-04-06: qty 10

## 2023-04-06 MED ORDER — MECLIZINE HCL 25 MG PO TABS: 12.5000 mg | ORAL_TABLET | Freq: Three times a day (TID) | ORAL | Status: DC | PRN

## 2023-04-06 MED ORDER — DEXAMETHASONE SODIUM PHOSPHATE 4 MG/ML IJ SOLN
4.0000 mg | INTRAMUSCULAR | Status: AC
  Administered 2023-04-06: 8 mg via INTRAVENOUS

## 2023-04-06 MED ORDER — ORAL CARE MOUTH RINSE: 15.0000 mL | Freq: Once | OROMUCOSAL | Status: AC

## 2023-04-06 MED ORDER — ACETAMINOPHEN 500 MG PO TABS
1000.0000 mg | ORAL_TABLET | Freq: Two times a day (BID) | ORAL | Status: DC
  Administered 2023-04-07 – 2023-04-08 (×3): 1000 mg via ORAL
  Filled 2023-04-06 (×3): qty 2

## 2023-04-06 MED ORDER — ENOXAPARIN SODIUM 40 MG/0.4ML IJ SOSY
40.0000 mg | PREFILLED_SYRINGE | INTRAMUSCULAR | Status: DC
  Administered 2023-04-07 – 2023-04-08 (×2): 40 mg via SUBCUTANEOUS
  Filled 2023-04-06 (×2): qty 0.4

## 2023-04-06 MED ORDER — DEXMEDETOMIDINE HCL IN NACL 80 MCG/20ML IV SOLN
INTRAVENOUS | Status: DC | PRN
  Administered 2023-04-06 (×2): 4 ug via INTRAVENOUS

## 2023-04-06 MED ORDER — STERILE WATER FOR INJECTION IJ SOLN
INTRAMUSCULAR | Status: DC | PRN
  Administered 2023-04-06: 10 mL via INTRAMUSCULAR

## 2023-04-06 MED ORDER — DROPERIDOL 2.5 MG/ML IJ SOLN
INTRAMUSCULAR | Status: DC | PRN
  Administered 2023-04-06: .625 mg via INTRAVENOUS

## 2023-04-06 MED ORDER — ONDANSETRON HCL 4 MG/2ML IJ SOLN: 4.0000 mg | Freq: Once | INTRAMUSCULAR | Status: DC | PRN

## 2023-04-06 MED ORDER — FENTANYL CITRATE PF 50 MCG/ML IJ SOSY: 25.0000 ug | PREFILLED_SYRINGE | INTRAMUSCULAR | Status: DC | PRN

## 2023-04-06 MED ORDER — LACTATED RINGERS IR SOLN
Status: DC | PRN
  Administered 2023-04-06: 1000 mL

## 2023-04-06 MED ORDER — LACTATED RINGERS IV SOLN: INTRAVENOUS | Status: DC | PRN

## 2023-04-06 MED ORDER — PROPOFOL 10 MG/ML IV BOLUS
INTRAVENOUS | Status: DC | PRN
  Administered 2023-04-06: 120 mg via INTRAVENOUS

## 2023-04-06 MED ORDER — ROCURONIUM BROMIDE 10 MG/ML (PF) SYRINGE
PREFILLED_SYRINGE | INTRAVENOUS | Status: AC
  Filled 2023-04-06: qty 10

## 2023-04-06 MED ORDER — STERILE WATER FOR INJECTION IJ SOLN
INTRAMUSCULAR | Status: DC | PRN
  Administered 2023-04-06: 10 mL

## 2023-04-06 MED ORDER — ROCURONIUM BROMIDE 10 MG/ML (PF) SYRINGE
PREFILLED_SYRINGE | INTRAVENOUS | Status: DC | PRN
  Administered 2023-04-06: 70 mg via INTRAVENOUS

## 2023-04-06 MED ORDER — OXYCODONE HCL 5 MG PO TABS: 5.0000 mg | ORAL_TABLET | Freq: Once | ORAL | Status: DC | PRN

## 2023-04-06 MED ORDER — ACETAMINOPHEN 500 MG PO TABS
1000.0000 mg | ORAL_TABLET | ORAL | Status: AC
  Administered 2023-04-06: 1000 mg via ORAL
  Filled 2023-04-06: qty 2

## 2023-04-06 MED ORDER — SUGAMMADEX SODIUM 200 MG/2ML IV SOLN
INTRAVENOUS | Status: DC | PRN
  Administered 2023-04-06: 200 mg via INTRAVENOUS

## 2023-04-06 MED ORDER — PROPOFOL 10 MG/ML IV BOLUS
INTRAVENOUS | Status: AC
  Filled 2023-04-06: qty 20

## 2023-04-06 MED ORDER — MIRTAZAPINE 7.5 MG PO TABS: 3.7500 mg | ORAL_TABLET | Freq: Every evening | ORAL | Status: DC | PRN

## 2023-04-06 MED ORDER — IBUPROFEN 400 MG PO TABS
600.0000 mg | ORAL_TABLET | Freq: Four times a day (QID) | ORAL | Status: DC
  Administered 2023-04-07 – 2023-04-08 (×5): 600 mg via ORAL
  Filled 2023-04-06 (×5): qty 1

## 2023-04-06 MED ORDER — CHLORHEXIDINE GLUCONATE 0.12 % MT SOLN
15.0000 mL | Freq: Once | OROMUCOSAL | Status: AC
  Administered 2023-04-06: 15 mL via OROMUCOSAL

## 2023-04-06 MED ORDER — EPHEDRINE SULFATE-NACL 50-0.9 MG/10ML-% IV SOSY
PREFILLED_SYRINGE | INTRAVENOUS | Status: DC | PRN
  Administered 2023-04-06 (×2): 10 mg via INTRAVENOUS

## 2023-04-06 MED ORDER — HYDROMORPHONE HCL 1 MG/ML IJ SOLN
0.5000 mg | INTRAMUSCULAR | Status: DC | PRN
  Administered 2023-04-06 – 2023-04-07 (×2): 0.5 mg via INTRAVENOUS
  Filled 2023-04-06 (×2): qty 0.5

## 2023-04-06 MED ORDER — ONDANSETRON HCL 4 MG PO TABS: 4.0000 mg | ORAL_TABLET | Freq: Four times a day (QID) | ORAL | Status: DC | PRN

## 2023-04-06 MED ORDER — BUPROPION HCL ER (XL) 150 MG PO TB24
150.0000 mg | ORAL_TABLET | Freq: Every day | ORAL | Status: DC
  Administered 2023-04-07 – 2023-04-08 (×2): 150 mg via ORAL
  Filled 2023-04-06 (×2): qty 1

## 2023-04-06 MED ORDER — ATORVASTATIN CALCIUM 10 MG PO TABS
10.0000 mg | ORAL_TABLET | Freq: Every day | ORAL | Status: DC
  Administered 2023-04-07 – 2023-04-08 (×2): 10 mg via ORAL
  Filled 2023-04-06 (×2): qty 1

## 2023-04-06 MED ORDER — KCL IN DEXTROSE-NACL 20-5-0.45 MEQ/L-%-% IV SOLN
INTRAVENOUS | Status: DC
  Filled 2023-04-06 (×2): qty 1000

## 2023-04-06 MED ORDER — CEFAZOLIN SODIUM-DEXTROSE 2-4 GM/100ML-% IV SOLN
2.0000 g | INTRAVENOUS | Status: AC
  Administered 2023-04-06: 2 g via INTRAVENOUS
  Filled 2023-04-06: qty 100

## 2023-04-06 MED ORDER — OXYCODONE HCL 5 MG PO TABS
5.0000 mg | ORAL_TABLET | ORAL | Status: DC | PRN
  Administered 2023-04-06: 5 mg via ORAL
  Filled 2023-04-06: qty 1

## 2023-04-06 MED ORDER — BUPIVACAINE HCL 0.25 % IJ SOLN
INTRAMUSCULAR | Status: DC | PRN
  Administered 2023-04-06: 30 mL

## 2023-04-06 MED ORDER — OXYCODONE HCL 5 MG/5ML PO SOLN: 5.0000 mg | Freq: Once | ORAL | Status: DC | PRN

## 2023-04-06 MED ORDER — MIDAZOLAM HCL 2 MG/2ML IJ SOLN
INTRAMUSCULAR | Status: AC
  Filled 2023-04-06: qty 2

## 2023-04-06 MED ORDER — BUPIVACAINE LIPOSOME 1.3 % IJ SUSP
INTRAMUSCULAR | Status: AC
  Filled 2023-04-06: qty 20

## 2023-04-06 MED ORDER — ONDANSETRON HCL 4 MG/2ML IJ SOLN
INTRAMUSCULAR | Status: AC
  Filled 2023-04-06: qty 2

## 2023-04-06 MED ORDER — LACTATED RINGERS IV SOLN: INTRAVENOUS | Status: DC

## 2023-04-06 SURGICAL SUPPLY — 91 items
ADH SKN CLS APL DERMABOND .7 (GAUZE/BANDAGES/DRESSINGS) ×1
AGENT HMST KT MTR STRL THRMB (HEMOSTASIS)
APL ESCP 34 STRL LF DISP (HEMOSTASIS)
APPLICATOR SURGIFLO ENDO (HEMOSTASIS) IMPLANT
BAG COUNTER SPONGE SURGICOUNT (BAG) IMPLANT
BAG LAPAROSCOPIC 12 15 PORT 16 (BASKET) IMPLANT
BAG RETRIEVAL 12/15 (BASKET) ×1
BAG SPNG CNTER NS LX DISP (BAG)
BLADE SURG SZ10 CARB STEEL (BLADE) IMPLANT
CATH ROBINSON RED A/P 16FR (CATHETERS) ×1 IMPLANT
COVER BACK TABLE 60X90IN (DRAPES) ×1 IMPLANT
COVER TIP SHEARS 8 DVNC (MISCELLANEOUS) ×1 IMPLANT
DERMABOND ADVANCED .7 DNX12 (GAUZE/BANDAGES/DRESSINGS) ×1 IMPLANT
DRAPE ARM DVNC X/XI (DISPOSABLE) ×4 IMPLANT
DRAPE COLUMN DVNC XI (DISPOSABLE) ×1 IMPLANT
DRAPE SHEET LG 3/4 BI-LAMINATE (DRAPES) ×1 IMPLANT
DRAPE SURG IRRIG POUCH 19X23 (DRAPES) ×1 IMPLANT
DRAPE UNDERBUTTOCKS STRL (DISPOSABLE) ×1 IMPLANT
DRIVER NDL MEGA SUTCUT DVNCXI (INSTRUMENTS) ×1 IMPLANT
DRIVER NDLE MEGA SUTCUT DVNCXI (INSTRUMENTS) ×1 IMPLANT
DRSG OPSITE POSTOP 4X6 (GAUZE/BANDAGES/DRESSINGS) IMPLANT
DRSG OPSITE POSTOP 4X8 (GAUZE/BANDAGES/DRESSINGS) IMPLANT
DRSG TELFA 3X8 NADH STRL (GAUZE/BANDAGES/DRESSINGS) ×1 IMPLANT
ELECT PENCIL ROCKER SW 15FT (MISCELLANEOUS) IMPLANT
ELECT REM PT RETURN 15FT ADLT (MISCELLANEOUS) ×1 IMPLANT
FORCEPS BPLR FENES DVNC XI (FORCEP) ×1 IMPLANT
FORCEPS PROGRASP DVNC XI (FORCEP) ×1 IMPLANT
GAUZE 4X4 16PLY ~~LOC~~+RFID DBL (SPONGE) ×2 IMPLANT
GLOVE BIO SURGEON STRL SZ 6 (GLOVE) ×4 IMPLANT
GLOVE BIO SURGEON STRL SZ 6.5 (GLOVE) ×1 IMPLANT
GOWN STRL REUS W/ TWL LRG LVL3 (GOWN DISPOSABLE) ×4 IMPLANT
GOWN STRL REUS W/TWL LRG LVL3 (GOWN DISPOSABLE) ×4
GRASPER SUT TROCAR 14GX15 (MISCELLANEOUS) IMPLANT
HOLDER FOLEY CATH W/STRAP (MISCELLANEOUS) IMPLANT
IRRIG SUCT STRYKERFLOW 2 WTIP (MISCELLANEOUS) ×1
IRRIGATION SUCT STRKRFLW 2 WTP (MISCELLANEOUS) ×1 IMPLANT
KIT BASIN OR (CUSTOM PROCEDURE TRAY) ×1 IMPLANT
KIT PROCEDURE DVNC SI (MISCELLANEOUS) IMPLANT
KIT TURNOVER KIT A (KITS) IMPLANT
LIGASURE IMPACT 36 18CM CVD LR (INSTRUMENTS) IMPLANT
MANIPULATOR ADVINCU DEL 3.0 PL (MISCELLANEOUS) IMPLANT
MANIPULATOR ADVINCU DEL 3.5 PL (MISCELLANEOUS) IMPLANT
MANIPULATOR UTERINE 4.5 ZUMI (MISCELLANEOUS) IMPLANT
NDL HYPO 21X1.5 SAFETY (NEEDLE) ×1 IMPLANT
NDL SPNL 18GX3.5 QUINCKE PK (NEEDLE) IMPLANT
NDL SPNL 22GX3.5 QUINCKE BK (NEEDLE) ×1 IMPLANT
NEEDLE HYPO 21X1.5 SAFETY (NEEDLE) ×1 IMPLANT
NEEDLE SPNL 18GX3.5 QUINCKE PK (NEEDLE) ×1 IMPLANT
NEEDLE SPNL 22GX3.5 QUINCKE BK (NEEDLE) ×1 IMPLANT
NS IRRIG 1000ML POUR BTL (IV SOLUTION) ×1 IMPLANT
OBTURATOR OPTICAL STND 8 DVNC (TROCAR) ×1
OBTURATOR OPTICALSTD 8 DVNC (TROCAR) ×1 IMPLANT
PACK LITHOTOMY IV (CUSTOM PROCEDURE TRAY) ×1 IMPLANT
PACK ROBOT GYN CUSTOM WL (TRAY / TRAY PROCEDURE) ×1 IMPLANT
PAD OB MATERNITY 4.3X12.25 (PERSONAL CARE ITEMS) ×1 IMPLANT
PAD POSITIONING PINK XL (MISCELLANEOUS) ×1 IMPLANT
PENCIL SMOKE EVACUATOR (MISCELLANEOUS) IMPLANT
PORT ACCESS TROCAR AIRSEAL 12 (TROCAR) IMPLANT
SCISSORS MNPLR CVD DVNC XI (INSTRUMENTS) ×1 IMPLANT
SCRUB CHG 4% DYNA-HEX 4OZ (MISCELLANEOUS) IMPLANT
SEAL UNIV 5-12 XI (MISCELLANEOUS) ×4 IMPLANT
SET TRI-LUMEN FLTR TB AIRSEAL (TUBING) ×1 IMPLANT
SOL PREP POV-IOD 4OZ 10% (MISCELLANEOUS) ×1 IMPLANT
SPIKE FLUID TRANSFER (MISCELLANEOUS) ×1 IMPLANT
SPONGE T-LAP 18X18 ~~LOC~~+RFID (SPONGE) IMPLANT
SURGIFLO W/THROMBIN 8M KIT (HEMOSTASIS) IMPLANT
SURGILUBE 2OZ TUBE FLIPTOP (MISCELLANEOUS) ×1 IMPLANT
SUT MNCRL AB 4-0 PS2 18 (SUTURE) IMPLANT
SUT PDS AB 1 TP1 96 (SUTURE) IMPLANT
SUT V-LOC 180 0-0 GS22 (SUTURE) IMPLANT
SUT VIC AB 0 CT1 27 (SUTURE)
SUT VIC AB 0 CT1 27XBRD ANTBC (SUTURE) IMPLANT
SUT VIC AB 2-0 CT1 27 (SUTURE) ×1
SUT VIC AB 2-0 CT1 TAPERPNT 27 (SUTURE) IMPLANT
SUT VIC AB 4-0 PS2 18 (SUTURE) ×2 IMPLANT
SUT VICRYL 0 27 CT2 27 ABS (SUTURE) ×1 IMPLANT
SUT VLOC 180 0 9IN GS21 (SUTURE) IMPLANT
SYR 10ML LL (SYRINGE) IMPLANT
SYR BULB IRRIG 60ML STRL (SYRINGE) IMPLANT
SYS BAG RETRIEVAL 10MM (BASKET)
SYS WOUND ALEXIS 18CM MED (MISCELLANEOUS)
SYSTEM BAG RETRIEVAL 10MM (BASKET) IMPLANT
SYSTEM WOUND ALEXIS 18CM MED (MISCELLANEOUS) IMPLANT
TOWEL OR 17X26 10 PK STRL BLUE (TOWEL DISPOSABLE) ×1 IMPLANT
TOWEL OR NON WOVEN STRL DISP B (DISPOSABLE) ×1 IMPLANT
TRAP SPECIMEN MUCUS 40CC (MISCELLANEOUS) IMPLANT
TRAY FOLEY MTR SLVR 16FR STAT (SET/KITS/TRAYS/PACK) ×1 IMPLANT
TROCAR PORT AIRSEAL 5X120 (TROCAR) IMPLANT
UNDERPAD 30X36 HEAVY ABSORB (UNDERPADS AND DIAPERS) ×2 IMPLANT
WATER STERILE IRR 1000ML POUR (IV SOLUTION) ×1 IMPLANT
YANKAUER SUCT BULB TIP 10FT TU (MISCELLANEOUS) IMPLANT

## 2023-04-06 NOTE — Anesthesia Procedure Notes (Signed)
Procedure Name: Intubation Date/Time: 04/06/2023 2:20 PM  Performed by: Judaea Burgoon D, CRNAPre-anesthesia Checklist: Patient identified, Emergency Drugs available, Suction available and Patient being monitored Patient Re-evaluated:Patient Re-evaluated prior to induction Oxygen Delivery Method: Circle system utilized Preoxygenation: Pre-oxygenation with 100% oxygen Induction Type: IV induction Ventilation: Mask ventilation without difficulty Laryngoscope Size: Mac and 3 Grade View: Grade II Tube type: Oral Tube size: 7.0 mm Number of attempts: 1 Airway Equipment and Method: Stylet and Oral airway Placement Confirmation: ETT inserted through vocal cords under direct vision, positive ETCO2 and breath sounds checked- equal and bilateral Secured at: 21 cm Tube secured with: Tape Dental Injury: Teeth and Oropharynx as per pre-operative assessment

## 2023-04-06 NOTE — H&P (Signed)
Gynecologic Oncology H&P  04/06/23  Treatment History: 01/12/23: Pap - atypical glandular cell, favor neoplastic. While overall cellularity of the pap is low, cells look highly atypical and are suspicious for carcinoma favored to be endometrial. HR HPV negative.    Pelvic ultrasound on 01/21/23 revealed a uterus measuring 7.5 x 5.4 x 7 cm.  Multiple fibroids noted measuring up to 3.8 cm.  Endometrium is thickened measuring 24 mm and irregular.  There is a frond-like contour appreciated adjacent to a small amount of endometrial free fluid and a focal masslike area with blood flow measuring up to 4.2 cm.  Left ovary not visualized.  Right ovary contains a 2.3 cm simple appearing cyst.   CT A/P on 02/07/23 reveals a heterogenous lobulated uterus. No evidence of metastatic disease. Minimal sigmoid diverticulosis without diverticulitis.   The patient had recent visit with pulmonology. 1.4% risk of postop pulmonary complication estimated with duration of surgery is less than 3 hours.   Her hemoglobin A1c was 5.6% in January.   The patient presents with her brother and sister-in-law today.  Starting in June of last year, she developed intermittent spotting, occurring most days.  She wears depends due to urinary incontinence, has some blood on the depends intermittently.  Patient sister-in-law denies that this is ever large quantity.  There is been no passage of clots, associated pelvic pain or cramping.  Since her recent Pap smear, the patient notes decreased bleeding after being started on Megace.   The patient endorses approximately 20-25 pounds of weight loss over the last year.  She endorses decreased appetite.  She has had some nausea yesterday and today but otherwise denies any nausea or emesis.  She has intermittent constipation at baseline, uses MiraLAX as needed.  She endorses some urinary incontinence.  She endorses some mild shortness of breath intermittently with ambulation.   Her GYN history is  notable for a D&C years ago -does not remember why this was done.  Interval History: Doing well. Light bleeding since visit with me.  Past Medical/Surgical History: Past Medical History:  Diagnosis Date   Allergic rhinitis    Anemia    Anxiety    Arthritis    Bipolar 1 disorder (HCC)    Cancer (HCC)    Depression    Diverticulosis of colon (without mention of hemorrhage) 08/25/2011   Dr. Dulce Sellar   Dyspnea    Hearing loss in left ear    since birth   Hyperlipidemia    Hypertension    resolved   Insomnia    Obesity    Palpitation    Renal disorder    Tardive dyskinesia    Tremor    Vertigo     Past Surgical History:  Procedure Laterality Date   COLONOSCOPY  08/25/2011   Procedure: COLONOSCOPY;  Surgeon: Freddy Jaksch, MD;  Location: [facility];  Service: Endoscopy;  Laterality: N/A;   DILATION AND CURETTAGE OF UTERUS      Family History  Problem Relation Age of Onset   Hypertension Mother    Kidney disease Mother    Stomach cancer Father    Hyperlipidemia Sister    Hypothyroidism Sister    Prostate cancer Brother    Hypertension Brother    Kidney disease Brother    Stroke Brother    Prostate cancer Brother    Breast cancer Neg Hx    Pancreatic cancer Neg Hx    Ovarian cancer Neg Hx    Endometrial cancer Neg Hx  Social History   Socioeconomic History   Marital status: Single    Spouse name: Not on file   Number of children: 1   Years of education: 12   Highest education level: Not on file  Occupational History   Occupation: Retired  Tobacco Use   Smoking status: Never   Smokeless tobacco: Never  Vaping Use   Vaping Use: Never used  Substance and Sexual Activity   Alcohol use: Never   Drug use: Never   Sexual activity: Not Currently  Other Topics Concern   Not on file  Social History Narrative   Lives alone in house, does not need assist device.  Works part time at [employer]. Has a son.     Cousin and sister in close  proximity   Right handed   2 cups coffee per day   Social Determinants of Health   Financial Resource Strain: Low Risk  (12/16/2022)   Overall Financial Resource Strain (CARDIA)    Difficulty of Paying Living Expenses: Not hard at all  Food Insecurity: Food Insecurity Present (03/03/2023)   Hunger Vital Sign    Worried About Running Out of Food in the Last Year: Sometimes true    Ran Out of Food in the Last Year: Sometimes true  Transportation Needs: Unmet Transportation Needs (03/03/2023)   PRAPARE - Administrator, Civil Service (Medical): Yes    Lack of Transportation (Non-Medical): Yes  Physical Activity: Insufficiently Active (12/16/2022)   Exercise Vital Sign    Days of Exercise per Week: 3 days    Minutes of Exercise per Session: 20 min  Stress: Stress Concern Present (12/16/2022)   [employer] of Occupational Health - Occupational Stress Questionnaire    Feeling of Stress : To some extent  Social Connections: Not on file    Current Medications:  Current Facility-Administered Medications:    ceFAZolin (ANCEF) IVPB 2g/100 mL premix, 2 g, Intravenous, On Call to OR, Cross, Melissa D, NP   dexamethasone (DECADRON) injection 4 mg, 4 mg, Intravenous, On Call to OR, Cross, Laurene Footman, NP   lactated ringers infusion, , Intravenous, Continuous, Lowella Curb, MD, Last Rate: 10 mL/hr at 04/06/23 1256, New Bag at 04/06/23 1256  Review of Systems: + Decreased appetite, chills, fatigue, weight loss, hearing loss, ringing in the ears, shortness of breath, abdominal pain, incontinence, vaginal bleeding, dizziness, gait problem, anxiety, depression. Denies fevers. Denies neck lumps or masses, mouth sores or voice changes. Denies cough or wheezing.   Denies chest pain or palpitations. Denies leg swelling. Denies abdominal distention, blood in stools, constipation, diarrhea, nausea, vomiting, or early satiety. Denies pain with intercourse, dysuria, frequency,  hematuria. Denies hot flashes, pelvic pain, or vaginal discharge.   Denies joint pain, back pain or muscle pain/cramps. Denies itching, rash, or wounds. Denies headaches, numbness or seizures. Denies swollen lymph nodes or glands, denies easy bruising or bleeding. Denies confusion, or decreased concentration.  Physical Exam: Ht 5\' 9"  (1.753 m)   Wt 146 lb 9.6 oz (66.5 kg)   BMI 21.65 kg/m  General: Alert, oriented, no acute distress.  HEENT: Normocephalic, atraumatic. Sclera anicteric.  Chest: Clear to auscultation bilaterally. No wheezes, rhonchi, or rales. Cardiovascular: Regular rate and rhythm, no murmurs, rubs, or gallops.  Abdomen: Normoactive bowel sounds. Soft, nondistended, nontender to palpation. No masses or hepatosplenomegaly appreciated. No palpable fluid wave.  Extremities: Grossly normal range of motion. Warm, well perfused. No edema bilaterally.   Laboratory & Radiologic Studies:  Latest Ref Rng & Units 03/24/2023   10:37 AM 11/24/2022   12:33 PM 11/04/2022    3:14 PM  CBC  WBC 4.0 - 10.5 K/uL 5.3  5.6  6.4   Hemoglobin 12.0 - 15.0 g/dL 16.1  09.6  04.5   Hematocrit 36.0 - 46.0 % 42.9  45.5  43.0   Platelets 150 - 400 K/uL 237  263  245       Latest Ref Rng & Units 03/24/2023   10:37 AM 11/04/2022    3:14 PM 06/28/2022   11:05 AM  BMP  Glucose 70 - 99 mg/dL 409  88  811   BUN 8 - 23 mg/dL 16  12  9    Creatinine 0.44 - 1.00 mg/dL 9.14  7.82  9.56   BUN/Creat Ratio 12 - 28  12  11    Sodium 135 - 145 mmol/L 142  141  144   Potassium 3.5 - 5.1 mmol/L 4.0  4.2  4.0   Chloride 98 - 111 mmol/L 107  104  105   CO2 22 - 32 mmol/L 27  23  24    Calcium 8.9 - 10.3 mg/dL 9.6  9.9  21.3    Assessment & Plan: BELKIS NORBECK is a 77 y.o. woman with postmenopausal bleeding on recent Pap smear most suggestive of endometrial cancer.   Counseling performed on 5/24. Plan for [facility] with definitive hysterectomy, staging guided per endometrial sampling today.   Eugene Garnet, MD  Division of Gynecologic Oncology  Department of Obstetrics and Gynecology  [facility]

## 2023-04-06 NOTE — Op Note (Signed)
OPERATIVE NOTE  Pre-operative Diagnosis: atypical glandular cells on pap, thickened endometrium, PMB, enlarged uterus  Post-operative Diagnosis: same, endometrial cancer (low grade endometrioid on frozen), fibroid uterus  Operation: Dilation and curettage, robotic-assisted laparoscopic total hysterectomy with bilateral salpingoophorectomy, SLN biopsy bilaterally, peritoneal biopsy, mini-lap for specimen delivery  Surgeon: Eugene Garnet MD  Assistant Surgeon: Antionette Char MD (an MD assistant was necessary for tissue manipulation, management of robotic instrumentation, retraction and positioning due to the complexity of the case and hospital policies).   Anesthesia: GET  Urine Output: 550 cc  Operative Findings: On EUA, 10-12 cm mobile uterus. On D&C moderate amount of tissue c/w tumor appreciated after curetting. On intra-abdominal entry, significant adhesions between bilateral liver surfaces and the anterior abdominal wall. Normal appearing stomach, small and large bowel, omentum. No ascites. No adenopathy. Uterus 10 cm with multiple 2-4 cm fibroids including right lateral LUS fibroid. Normal appearing adnexa. Miliary-appearing disease noted on the peritoneum overlying the left uterosacral ligament (resected). No other peritoneal findings. Mapping successful to bilateral obturator SLNs.  Frozen section from [facility] c/w endometroid cancer, likely low grade.  Estimated Blood Loss:  50 cc      Total IV Fluids: see I&O flowsheet         Specimens: endometrial curettings, uterus, cervix, bilateral tubes and ovaries, posterior cul de sac peritoneum, pelvic washings, bilateral obturator SLNs         Complications:  None apparent; patient tolerated the procedure well.         Disposition: PACU - hemodynamically stable.  Procedure Details  The patient was seen in the Holding Room. The risks, benefits, complications, treatment options, and expected outcomes were discussed with the patient.   The patient concurred with the proposed plan, giving informed consent.  The site of surgery properly noted/marked. The patient was identified as Shanise Balch and the procedure verified as a D&C, robotic-assisted hysterectomy with bilateral salpingo oophorectomy with SLN biopsy.   After induction of anesthesia, the patient was draped and prepped in the usual sterile manner. Patient was placed in supine position after anesthesia and draped and prepped in the usual sterile manner as follows: Her arms were tucked to her side with all appropriate precautions.  The patient was secured to the bed using padding and tape across her chest.  The patient was placed in the semi-lithotomy position in [place] stirrups.  The perineum and vagina were prepped with CHG. The patient's abdomen was prepped with ChloraPrep and she was draped after the prep had been allowed to dry for 3 minutes.  A Time Out was held and the above information confirmed.  The urethra was prepped with Betadine. Foley catheter was placed.  A sterile speculum was placed in the vagina.  The cervix was grasped with a single-tooth tenaculum. 2mg  total of ICG was injected into the cervical stroma at 2 and 9 o'clock with 1cc injected at a 1cm and 2mm depth (concentration 0.5mg /ml) in all locations. The cervix was dilated with Pratt dilators to a 15F. A sharp curettage was performed with a bango curette and the curettings were placed on Telfa and sent to pathology for frozen. The ZUMI uterine manipulator with a medium colpotomizer ring was placed without difficulty.  A pneum occluder balloon was placed over the manipulator.  OG tube placement was confirmed and to suction.   Next, a 10 mm skin incision was made 1 cm below the subcostal margin in the midclavicular line.  The 5 mm Optiview port and scope was  used for direct entry.  Opening pressure was under 10 mm CO2.  The abdomen was insufflated and the findings were noted as above.   At this point and all points  during the procedure, the patient's intra-abdominal pressure did not exceed 15 mmHg. Next, an 8 mm skin incision was made superior to the umbilicus and a right and left port were placed about 8 cm lateral to the robot port on the right and left side.  The 5 mm assist trocar was exchanged for a 10-12 mm port. All ports were placed under direct visualization.  The patient was placed in steep Trendelenburg.  Bowel was folded away into the upper abdomen.  The robot was docked in the normal manner.  Once frozen had returned, the right and left peritoneum were opened parallel to the IP ligament to open the retroperitoneal spaces bilaterally. The round ligaments were transected. The SLN mapping was performed in bilateral pelvic basins. After identifying the ureters, the para rectal and paravesical spaces were opened up entirely with careful dissection below the level of the ureters bilaterally and to the depth of the uterine artery origin in order to skeletonize the uterine "web" and ensure visualization of all parametrial channels. The para-aortic basins were carefully exposed and evaluated for isolated para-aortic SLN's. Lymphatic channels were identified travelling to the following visualized sentinel lymph node's: bilateral obturator SLNs. These SLN's were separated from their surrounding lymphatic tissue, removed and sent for permanent pathology.  The hysterectomy was started.  The ureter was again noted to be on the medial leaf of the broad ligament.  The peritoneum above the ureter was incised and stretched and the infundibulopelvic ligament was skeletonized, cauterized and cut.  The posterior peritoneum was taken down to the level of the KOH ring.  On the left overlaying the uterosacral ligament, some miliary-appearing disease noted. The peritoneum in this area was resected and sent to pathology for permanent. The anterior peritoneum was also taken down.  The bladder flap was created to the level of the KOH  ring.  The uterine artery on the right side was skeletonized, cauterized and cut in the normal manner.  A similar procedure was performed on the left.  The colpotomy was made and the uterus, cervix, bilateral ovaries and tubes were amputated and placed in an endocatch bag inserted through the assist trocar.  Pedicles were inspected and excellent hemostasis was achieved.    The colpotomy at the vaginal cuff was closed with 0 Vicryl on a CT1 needle with a figure of eight at each apex and 0 V-Loc to close the midportion of the cuff in a running manner.  Irrigation was used and excellent hemostasis was achieved. Pressure was decreased to 5 mm Hg with excellent hemostasis maintained. Robotic instruments were removed under direct visulaization.  The robot was undocked. The fascia at the 12 mm port was closed with 0 Vicryl on using a PMI fascial closure device under direct visualization.   The supraumbilical trocar was removed and the incision extended 6-8 cm with a scalpel. The incision was carried down to and through the fascia, with the abdomen insufflated, using monopolar electrocautery. The peritoneal incision was extended under direct visualization. The endocatch bag with the uterine specimen was delivered through the incision. Some bleeding was noted along the rectus muscle. Electrocautery and a figure of eight with 2-0 Vicryl were used to achieve hemostasis. The incision was then closed with running #1 looped PDS tied in the midline. The subcutaneous tissue was irrigated  and hemostasis achieved. Exparel was injected for local anesthesia. The subcutaneous tissue was closed with 2-0 Vicyrl in running fashion.   The subcuticular tissue of all incisions was closed with 4-0 Vicryl and the skin was closed with 4-0 Monocryl in a subcuticular manner.  Dermabond was applied.  A honeycomb dressing was applied to the mini-lap site.  The vagina was swabbed with minimal bleeding noted. All sponge, lap and needle counts  were correct x  3.   The patient was transferred to the recovery room in stable condition.  Eugene Garnet, MD

## 2023-04-06 NOTE — Transfer of Care (Signed)
Immediate Anesthesia Transfer of Care Note  Patient: Judy [name]  Procedure(s) Performed: Procedure(s): DILATATION AND CURETTAGE (N/A) XI ROBOTIC ASSISTED TOTAL HYSTERECTOMY WITH BILATERAL SALPINGO OOPHORECTOMY (N/A) SENTINEL NODE BIOPSY (N/A) MINI LAPAROTOMY (N/A)  Patient Location: PACU  Anesthesia Type:General  Level of Consciousness: Alert, Awake, Oriented  Airway & Oxygen Therapy: Patient Spontanous Breathing  Post-op Assessment: Report given to RN  Post vital signs: Reviewed and stable  Last Vitals:  Vitals:   04/06/23 1234  BP: 125/69  Pulse: (!) 54  Resp: 16  Temp: 36.9 C  SpO2: 100%    Complications: No apparent anesthesia complications

## 2023-04-07 ENCOUNTER — Encounter (HOSPITAL_COMMUNITY): Payer: Self-pay | Admitting: Gynecologic Oncology

## 2023-04-07 DIAGNOSIS — D259 Leiomyoma of uterus, unspecified: Secondary | ICD-10-CM | POA: Diagnosis not present

## 2023-04-07 DIAGNOSIS — Z5982 Transportation insecurity: Secondary | ICD-10-CM | POA: Diagnosis not present

## 2023-04-07 DIAGNOSIS — I10 Essential (primary) hypertension: Secondary | ICD-10-CM | POA: Diagnosis not present

## 2023-04-07 DIAGNOSIS — C541 Malignant neoplasm of endometrium: Secondary | ICD-10-CM | POA: Diagnosis not present

## 2023-04-07 DIAGNOSIS — R9389 Abnormal findings on diagnostic imaging of other specified body structures: Secondary | ICD-10-CM | POA: Diagnosis not present

## 2023-04-07 DIAGNOSIS — K660 Peritoneal adhesions (postprocedural) (postinfection): Secondary | ICD-10-CM | POA: Diagnosis not present

## 2023-04-07 DIAGNOSIS — N80329 Endometriosis of the posterior cul-de-sac, unspecified depth: Secondary | ICD-10-CM | POA: Diagnosis not present

## 2023-04-07 LAB — BASIC METABOLIC PANEL
Anion gap: 6 (ref 5–15)
BUN: 8 mg/dL (ref 8–23)
CO2: 27 mmol/L (ref 22–32)
Calcium: 8.8 mg/dL — ABNORMAL LOW (ref 8.9–10.3)
Chloride: 107 mmol/L (ref 98–111)
Creatinine, Ser: 0.98 mg/dL (ref 0.44–1.00)
GFR, Estimated: >60 mL/min (ref >60)
Glucose, Bld: 161 mg/dL — ABNORMAL HIGH (ref 70–99)
Potassium: 3.8 mmol/L (ref 3.5–5.1)
Sodium: 140 mmol/L (ref 135–145)

## 2023-04-07 LAB — CBC
HCT: 37.3 % (ref 36.0–46.0)
Hemoglobin: 12.1 g/dL (ref 12.0–15.0)
MCH: 30.9 pg (ref 26.0–34.0)
MCHC: 32.4 g/dL (ref 30.0–36.0)
MCV: 95.4 fL (ref 80.0–100.0)
Platelets: 213 10*3/uL (ref 150–400)
RBC: 3.91 MIL/uL (ref 3.87–5.11)
RDW: 14 % (ref 11.5–15.5)
WBC: 12 10*3/uL — ABNORMAL HIGH (ref 4.0–10.5)
nRBC: 0 % (ref 0.0–0.2)

## 2023-04-07 LAB — MAGNESIUM: Magnesium: 2.4 mg/dL (ref 1.7–2.4)

## 2023-04-07 LAB — PHOSPHORUS: Phosphorus: 2.6 mg/dL (ref 2.5–4.6)

## 2023-04-07 NOTE — Progress Notes (Signed)
Brief update  Patient noted to be bradycardic down to the 40s earlier today.  Was asymptomatic at the time.  EKG obtained and showed sinus rhythm heart rate in the 60s, some PACs.  The patient notes overall doing well.  She has tolerated some p.o. intake without nausea or emesis.  Spent some time up in the chair and ambulated twice.  Was able to urinate once in the toilet and has urinated multiple times in her depends.  Has some abdominal soreness, otherwise doing very well.  Reports asymptomatic with regard to her heart rate.  Denies any chest pain, palpitations, or dizziness.  On my exam, heart rate is in the 60s.  Discussed with her late hour and family who lives far away.  Will plan to observe her overnight given bradycardia today with morning discharge if she is doing well.  Eugene Garnet MD Gynecologic Oncology

## 2023-04-07 NOTE — Progress Notes (Signed)
Spoke with [name], pt's brother, this morning regarding plan of care and discharge plans. Updated pt's condition and plan of discharge for later this afternoon pending MD approval. Will update again when plans confirmed.

## 2023-04-07 NOTE — TOC CM/SW Note (Signed)
Transition of Care [facility]) - Inpatient Brief Assessment   Patient Details  Name: Judy [name] MRN: [id number] Date of Birth: 10-13-1945  Transition of Care [facility]) CM/SW Contact:    Amada Jupiter, LCSW Phone Number: 04/07/2023, 3:28 PM   Clinical Narrative: Met with pt who confirms she currently receives mobile meals daily and has family to assist with other meals/transportation/ groceries.  No TOC needs.   Transition of Care Asessment: Insurance and Status: Insurance coverage has been reviewed Patient has primary care physician: Yes Home environment has been reviewed: lives alone in apt Prior level of function:: independent Prior/Current Home Services: No current home services Social Determinants of Health Reivew: SDOH reviewed no interventions necessary Readmission risk has been reviewed: Yes Transition of care needs: no transition of care needs at this time

## 2023-04-07 NOTE — Progress Notes (Signed)
1 Day Post-Op Procedure(s) (LRB): DILATATION AND CURETTAGE (N/A) XI ROBOTIC ASSISTED TOTAL HYSTERECTOMY WITH BILATERAL SALPINGO OOPHORECTOMY (N/A) SENTINEL NODE BIOPSY (N/A) MINI LAPAROTOMY (N/A)  Subjective: Patient reports doing well, some soreness, denies significant pain. Was able to sleep some overnight. Denies shortness of breath, has some phlegm. Denies flatus. Ambulated once overnight.  Objective: Vital signs in last 24 hours: Temp:  [97.5 F (36.4 C)-99.6 F (37.6 C)] 98.3 F (36.8 C) (06/27 0544) Pulse Rate:  [51-77] 51 (06/27 0544) Resp:  [12-18] 18 (06/27 0544) BP: (125-180)/(65-109) 132/65 (06/27 0544) SpO2:  [97 %-100 %] 99 % (06/27 0544) Weight:  [146 lb 9.6 oz (66.5 kg)] 146 lb 9.6 oz (66.5 kg) (06/26 1234)    Intake/Output from previous day: 06/26 0701 - 06/27 0700 In: 1699.6 [P.O.:120; I.V.:1579.6] Out: 2100 [Urine:2050; Blood:50]  Physical Examination: General: Alert, oriented, no acute distress. HEENT: Atraumatic, normocephalic, sclera anicteric. Chest: Clear to auscultation bilaterally.  No wheezes or rhonchi. Cardiovascular: HR in low 60s, regular rhythm, no murmurs. Abdomen: soft, nontender, mildly hypoactive BS, non distended.  Incisions are clean, dry and intact. Extremities: Warm, well perfused.  No edema bilaterally.  Labs:    Latest Ref Rng & Units 04/07/2023    5:03 AM 03/24/2023   10:37 AM 11/24/2022   12:33 PM  CBC  WBC 4.0 - 10.5 K/uL 12.0  5.3  5.6   Hemoglobin 12.0 - 15.0 g/dL 96.2  95.2  84.1   Hematocrit 36.0 - 46.0 % 37.3  42.9  45.5   Platelets 150 - 400 K/uL 213  237  263       Latest Ref Rng & Units 04/07/2023    5:03 AM 03/24/2023   10:37 AM 11/04/2022    3:14 PM  BMP  Glucose 70 - 99 mg/dL 324  401  88   BUN 8 - 23 mg/dL 8  16  12    Creatinine 0.44 - 1.00 mg/dL 0.27  2.53  6.64   BUN/Creat Ratio 12 - 28   12   Sodium 135 - 145 mmol/L 140  142  141   Potassium 3.5 - 5.1 mmol/L 3.8  4.0  4.2   Chloride 98 - 111 mmol/L 107   107  104   CO2 22 - 32 mmol/L 27  27  23    Calcium 8.9 - 10.3 mg/dL 8.8  9.6  9.9    Assessment:  77 y.o. s/p Procedure(s): DILATATION AND CURETTAGE XI ROBOTIC ASSISTED TOTAL HYSTERECTOMY WITH BILATERAL SALPINGO OOPHORECTOMY SENTINEL NODE BIOPSY MINI LAPAROTOMY: progressing well. Reviewed frozen section, surgery this am.  Post-op: meeting early milestones. Remove foley this am, await spontaneous void. Will continue to ADAT. Ambulation encourgaed.   Dyspnea: at baseline. Breathing stable overnight on room air. Reviewed IS use at bedside this am.  Prophylaxis: SCDs, ambulation, lovenox.   Plan: Dispo:  anticipate later today. The patient is to be discharged to home.   LOS: 0 days    Carver Fila 04/07/2023, 7:38 AM

## 2023-04-07 NOTE — Discharge Instructions (Addendum)
04/07/2023  Prescriptions were sent to your pharmacy before the surgery (tramadol, Senakot).  If you do not have much pain, you can use Tylenol and ibuprofen as needed for soreness.  The Senokot is to help keep your bowels regular until bowel function has returned to normal.  Activity: 1. Be up and out of the bed during the day.  Take a nap if needed.  You may walk up steps but be careful and use the hand rail.  Stair climbing will tire you more than you think, you may need to stop part way and rest.   2. No lifting or straining for 6 weeks (nothing more than 10-15 pounds)  3. Do Not drive if you are taking narcotic pain medicine. Most people are not ready to drive for 1-2 weeks after surgery.  4. Shower daily.  Use soap and water on your incision and pat dry; don't rub.   5. No sexual activity and nothing in the vagina for 10 weeks. Do not soak in water for 8 weeks (no tub baths).  Medications:  - Take ibuprofen and tylenol first line for pain control. Take these regularly (every 6 hours) to decrease the build up of pain.  - If necessary, for severe pain not relieved by ibuprofen, take percocet.  - While taking percocet you should take sennakot every night to reduce the likelihood of constipation. If this causes diarrhea, stop its use.  Diet: 1. Low sodium Heart Healthy Diet is recommended.  2. It is safe to use a laxative if you have difficulty moving your bowels.   Wound Care: 1. Keep clean and dry.  Shower daily.  Reasons to call the Doctor:  Fever - Oral temperature greater than 100.4 degrees Fahrenheit Foul-smelling vaginal discharge Difficulty urinating Nausea and vomiting Increased pain at the site of the incision that is unrelieved with pain medicine. Difficulty breathing with or without chest pain New calf pain especially if only on one side Sudden, continuing increased vaginal bleeding with or without clots.   Follow-up: 1. See Judy [name] in 3  weeks.  Contacts: For questions or concerns you should contact:  Dr. Eugene [name] at [phone] After hours and on week-ends call [phone] and ask to speak to the physician on call for Gynecologic Oncology

## 2023-04-08 ENCOUNTER — Encounter (HOSPITAL_COMMUNITY): Payer: Self-pay | Admitting: Gynecologic Oncology

## 2023-04-08 DIAGNOSIS — R9389 Abnormal findings on diagnostic imaging of other specified body structures: Secondary | ICD-10-CM | POA: Diagnosis not present

## 2023-04-08 DIAGNOSIS — I10 Essential (primary) hypertension: Secondary | ICD-10-CM | POA: Diagnosis not present

## 2023-04-08 DIAGNOSIS — K660 Peritoneal adhesions (postprocedural) (postinfection): Secondary | ICD-10-CM | POA: Diagnosis not present

## 2023-04-08 DIAGNOSIS — Z5982 Transportation insecurity: Secondary | ICD-10-CM | POA: Diagnosis not present

## 2023-04-08 DIAGNOSIS — C541 Malignant neoplasm of endometrium: Secondary | ICD-10-CM | POA: Diagnosis not present

## 2023-04-08 DIAGNOSIS — N80329 Endometriosis of the posterior cul-de-sac, unspecified depth: Secondary | ICD-10-CM | POA: Diagnosis not present

## 2023-04-08 LAB — BASIC METABOLIC PANEL
Anion gap: 6 (ref 5–15)
BUN: 10 mg/dL (ref 8–23)
CO2: 27 mmol/L (ref 22–32)
Calcium: 8.9 mg/dL (ref 8.9–10.3)
Chloride: 108 mmol/L (ref 98–111)
Creatinine, Ser: 0.79 mg/dL (ref 0.44–1.00)
GFR, Estimated: >60 mL/min (ref >60)
Glucose, Bld: 85 mg/dL (ref 70–99)
Potassium: 3.4 mmol/L — ABNORMAL LOW (ref 3.5–5.1)
Sodium: 141 mmol/L (ref 135–145)

## 2023-04-08 LAB — TSH: TSH: 1.374 u[IU]/mL (ref 0.350–4.500)

## 2023-04-08 LAB — CBC
HCT: 37.8 % (ref 36.0–46.0)
Hemoglobin: 12.2 g/dL (ref 12.0–15.0)
MCH: 31 pg (ref 26.0–34.0)
MCHC: 32.3 g/dL (ref 30.0–36.0)
MCV: 95.9 fL (ref 80.0–100.0)
Platelets: 190 10*3/uL (ref 150–400)
RBC: 3.94 MIL/uL (ref 3.87–5.11)
RDW: 14.3 % (ref 11.5–15.5)
WBC: 7.4 10*3/uL (ref 4.0–10.5)
nRBC: 0 % (ref 0.0–0.2)

## 2023-04-08 LAB — CYTOLOGY - NON PAP

## 2023-04-08 NOTE — Progress Notes (Signed)
I was contacted by Dr. Pricilla Holm regarding bradycardia in 40s and 50s without any symptoms.  EKG reviewed (not uploaded on epic yet)  EKG 04/08/2023 8: 50 7 AM: Sinus rhythm 53 bpm Low voltage PAC with aberrant conduction  Patient is asymptomatic without any lightheadedness, presyncope or syncope.  Hemodynamically stable.  No acute concerns with regards to bradycardia.  Okay to proceed with discharge as per primary team.   Elder Negus, MD Pager: [phone] Office: [phone]

## 2023-04-08 NOTE — Progress Notes (Signed)
Mobility Specialist - Progress Note   04/08/23 0954  Oxygen Therapy  Patient Activity (if Appropriate) Ambulating  Mobility  Activity Ambulated with assistance in hallway  Level of Assistance Standby assist, set-up cues, supervision of patient - no hands on  Assistive Device Front wheel walker  Distance Ambulated (ft) 80 ft  Activity Response Tolerated well  Mobility Referral Yes  $Mobility charge 1 Mobility  Mobility Specialist Start Time (ACUTE ONLY) [id number]  Mobility Specialist Stop Time (ACUTE ONLY) 0954  Mobility Specialist Time Calculation (min) (ACUTE ONLY) 12 min   Pt received in bed and agreeable to mobility. No complaints during session. Pt to recliner for breakfast after session with all needs met.    During mobility: 104 HR Post-mobility: 93 HR  [facility] nurse

## 2023-04-08 NOTE — Progress Notes (Signed)
2 Days Post-Op Procedure(s) (LRB): DILATATION AND CURETTAGE (N/A) XI ROBOTIC ASSISTED TOTAL HYSTERECTOMY WITH BILATERAL SALPINGO OOPHORECTOMY (N/A) SENTINEL NODE BIOPSY (N/A) MINI LAPAROTOMY (N/A)  Subjective: Patient reports doing well.  She denies any dizziness, palpitations, or chest pain.  She reports some abdominal soreness, denies any significant pain.  Denies flatus, feels some rumbling.  Reports voiding without issues.  Ambulated multiple times yesterday.  Objective: Vital signs in last 24 hours: Temp:  [97.5 F (36.4 C)-99 F (37.2 C)] 97.5 F (36.4 C) (06/28 0548) Pulse Rate:  [46-55] 47 (06/28 0548) Resp:  [17-18] 17 (06/28 0548) BP: (119-140)/(59-75) 132/75 (06/28 0548) SpO2:  [98 %-100 %] 99 % (06/28 0548) Last BM Date : 04/05/23  Intake/Output from previous day: 06/27 0701 - 06/28 0700 In: 820 [P.O.:820] Out: 400 [Urine:400] Multiple unmeasured voids.  Physical Examination: General: Alert, oriented, no acute distress. HEENT: Atraumatic, normocephalic, sclera anicteric. Chest: Clear to auscultation bilaterally.  No wheezes or rhonchi. Cardiovascular: HR in low 60s, regular rhythm, no murmurs. Abdomen: soft, nontender, good BS, non distended.  Incisions are clean, dry and intact. Extremities: Warm, well perfused.  No edema bilaterally.  Labs:    Latest Ref Rng & Units 04/08/2023    4:45 AM 04/07/2023    5:03 AM 03/24/2023   10:37 AM  CBC  WBC 4.0 - 10.5 K/uL 7.4  12.0  5.3   Hemoglobin 12.0 - 15.0 g/dL 16.1  09.6  04.5   Hematocrit 36.0 - 46.0 % 37.8  37.3  42.9   Platelets 150 - 400 K/uL 190  213  237       Latest Ref Rng & Units 04/08/2023    4:45 AM 04/07/2023    5:03 AM 03/24/2023   10:37 AM  BMP  Glucose 70 - 99 mg/dL 85  409  811   BUN 8 - 23 mg/dL 10  8  16    Creatinine 0.44 - 1.00 mg/dL 9.14  7.82  9.56   Sodium 135 - 145 mmol/L 141  140  142   Potassium 3.5 - 5.1 mmol/L 3.4  3.8  4.0   Chloride 98 - 111 mmol/L 108  107  107   CO2 22 - 32  mmol/L 27  27  27    Calcium 8.9 - 10.3 mg/dL 8.9  8.8  9.6    Assessment:  77 y.o. s/p Procedure(s): DILATATION AND CURETTAGE XI ROBOTIC ASSISTED TOTAL HYSTERECTOMY WITH BILATERAL SALPINGO OOPHORECTOMY SENTINEL NODE BIOPSY MINI LAPAROTOMY: progressing well.   Post-op: meeting milestones.  Awaiting return of bowel function.   Bradycardia: Asymptomatic.  Sinus rhythm with occasional PACs on EKG yesterday.  Consulted internal medicine to assure no further workup prior to discharge, they recommended cardiology consult.   Prophylaxis: SCDs, ambulation, lovenox.   Plan: Dispo: Later today pending cardiology consult. The patient is to be discharged to home.   LOS: 0 days    Carver Fila 04/08/2023, 8:00 AM

## 2023-04-08 NOTE — Anesthesia Postprocedure Evaluation (Signed)
Anesthesia Post Note  Patient: Judy [name]  Procedure(s) Performed: DILATATION AND CURETTAGE XI ROBOTIC ASSISTED TOTAL HYSTERECTOMY WITH BILATERAL SALPINGO OOPHORECTOMY SENTINEL NODE BIOPSY MINI LAPAROTOMY     Patient location during evaluation: PACU Anesthesia Type: General Level of consciousness: awake and alert Pain management: pain level controlled Vital Signs Assessment: post-procedure vital signs reviewed and stable Respiratory status: spontaneous breathing, nonlabored ventilation, respiratory function stable and patient connected to nasal cannula oxygen Cardiovascular status: blood pressure returned to baseline and stable Postop Assessment: no apparent nausea or vomiting Anesthetic complications: no  No notable events documented.  Last Vitals:  Vitals:   04/07/23 2145 04/08/23 0548  BP: (!) 140/71 132/75  Pulse: (!) 46 (!) 47  Resp: 18 17  Temp: 36.9 C (!) 36.4 C  SpO2: 100% 99%    Last Pain:  Vitals:   04/08/23 0548  TempSrc: Oral  PainSc:                  Mihail Prettyman S

## 2023-04-08 NOTE — Discharge Summary (Signed)
Physician Discharge Summary  Patient ID: Judy [name] MRN: [id number] DOB/AGE: [birth year] 77 y.o.  Admit date: 04/06/2023 Discharge date: 04/08/2023  Admission Diagnoses: Atypical glandular cells, favor neoplasia on cervical Pap smear  Discharge Diagnoses:  Principal Problem:   Atypical glandular cells, favor neoplasia on cervical Pap smear done 01/12/23 Active Problems:   Endometrial cancer [facility])   Discharged Condition:  The patient is in good condition and stable for discharge.    Hospital Course: The patient was admitted on 6/26 for planned surgery in the setting of atypical glandular cells seen on a Pap smear as well as thickened endometrium and postmenopausal bleeding.  She underwent dilation and curettage with frozen section revealing endometrioid cancer, likely low-grade.  She ultimately underwent robotic assisted total hysterectomy with BSO, sentinel lymph node biopsy bilaterally, peritoneal biopsy, and mini laparotomy for specimen delivery.  Estimated blood loss for the procedure was 50 cc.  She did very well from a postoperative standpoint although was noted to have asymptomatic bradycardia.  Cardiology was consulted and felt that given her lack of symptoms as well as EKG findings that she was safe for discharge.  She was discharged on postoperative day #2 meeting postoperative milestones.  Pain was well-controlled.  She was ambulating without difficulty.  Consults: cardiology  Significant Diagnostic Studies:     Latest Ref Rng & Units 04/08/2023    4:45 AM 04/07/2023    5:03 AM 03/24/2023   10:37 AM  CBC  WBC 4.0 - 10.5 K/uL 7.4  12.0  5.3   Hemoglobin 12.0 - 15.0 g/dL 78.2  95.6  21.3   Hematocrit 36.0 - 46.0 % 37.8  37.3  42.9   Platelets 150 - 400 K/uL 190  213  237       Latest Ref Rng & Units 04/08/2023    4:45 AM 04/07/2023    5:03 AM 03/24/2023   10:37 AM  BMP  Glucose 70 - 99 mg/dL 85  086  578   BUN 8 - 23 mg/dL 10  8  16    Creatinine 0.44 - 1.00 mg/dL 4.69   6.29  5.28   Sodium 135 - 145 mmol/L 141  140  142   Potassium 3.5 - 5.1 mmol/L 3.4  3.8  4.0   Chloride 98 - 111 mmol/L 108  107  107   CO2 22 - 32 mmol/L 27  27  27    Calcium 8.9 - 10.3 mg/dL 8.9  8.8  9.6    TSH: 4.13  Treatments: Surgery as above, IVF for 12 hours after surgery  Discharge Exam: Blood pressure 132/75, pulse (!) 47, temperature (!) 97.5 F (36.4 C), temperature source Oral, resp. rate 17, height 5\' 9"  (1.753 m), weight 146 lb 9.6 oz (66.5 kg), SpO2 99 %. See daily progress note  Disposition: Discharge disposition: 01-Home or Self Care       Discharge Instructions     Diet - low sodium heart healthy   Complete by: As directed       Allergies as of 04/08/2023   No Known Allergies      Medication List     TAKE these medications    acetaminophen 325 MG tablet Commonly known as: TYLENOL Take 2 tablets (650 mg total) by mouth every 6 (six) hours as needed for mild pain (or Fever >/= 101).   albuterol 108 (90 Base) MCG/ACT inhaler Commonly known as: VENTOLIN HFA Inhale 2 puffs into the lungs every 6 (six) hours as needed for wheezing  or shortness of breath. Use with spacer   atorvastatin 10 MG tablet Commonly known as: LIPITOR Take 1 tablet (10 mg total) by mouth daily.   b complex vitamins capsule Take 1 capsule by mouth daily. With C   buPROPion 150 MG 24 hr tablet Commonly known as: WELLBUTRIN XL Take 1 tablet (150 mg total) by mouth daily.   cholecalciferol 25 MCG (1000 UNIT) tablet Commonly known as: VITAMIN D3 Take 1,000 Units by mouth daily.   FISH OIL BURP-LESS PO Take 700 mg by mouth daily.   Ingrezza 80 MG capsule Generic drug: valbenazine Take 1 capsule (80 mg total) by mouth daily.   ipratropium-albuterol 0.5-2.5 (3) MG/3ML Soln Commonly known as: DUONEB 1 treatment in the morning, 1 treatment in the evenings, every 6 hours as needed in between for shortness of breath or wheezing   meclizine 12.5 MG tablet Commonly  known as: ANTIVERT Take 1 tablet (12.5 mg total) by mouth 3 (three) times daily as needed for dizziness.   mirtazapine 7.5 MG tablet Commonly known as: REMERON TAKE 0.5-1 TABLETS (3.75-7.5 MG TOTAL) BY MOUTH AT BEDTIME AS NEEDED.   polyethylene glycol 17 g packet Commonly known as: MIRALAX / GLYCOLAX Take 17 g by mouth daily. What changed:  when to take this reasons to take this   propranolol 10 MG tablet Commonly known as: INDERAL Take 1 tablet (10 mg total) by mouth 3 (three) times daily as needed. What changed: reasons to take this   senna-docusate 8.6-50 MG tablet Commonly known as: Senokot-S Take 2 tablets by mouth at bedtime. For AFTER surgery, do not take if having diarrhea   Senna Plus 8.6-50 MG Caps Generic drug: Sennosides-Docusate Sodium TAKE 2 TABLETS BY MOUTH AT BEDTIME. FOR AFTER SURGERY, DO NOT TAKE IF HAVING DIARRHEA   traMADol 50 MG tablet Commonly known as: ULTRAM Take 1 tablet (50 mg total) by mouth every 6 (six) hours as needed for severe pain. For AFTER surgery only, do not take and drive         Greater than thirty minutes were spend for face to face discharge instructions and discharge orders/summary in EPIC.   Signed: Carver Fila 04/08/2023, 11:14 AM

## 2023-04-08 NOTE — Progress Notes (Signed)
Reviewed discharge instructions with pt and brother. All questions answered. Pt taken via wheelchair to front entrance to meet ride in stable condition.

## 2023-04-11 ENCOUNTER — Telehealth: Payer: Self-pay | Admitting: Surgery

## 2023-04-11 NOTE — Telephone Encounter (Signed)
Attempted to reach patient to check in with her post-operatively. Unable to reach patient. Left message requesting return call.   ?

## 2023-04-11 NOTE — Telephone Encounter (Signed)
Spoke with Ms. Fewell today. She states she is eating, drinking and urinating well. She has not had a BM yet but is passing gas. She is taking senokot as prescribed and encouraged her to drink plenty of water. She denies fever or chills. Incisions are dry and intact. She rates her pain 5/10. Her pain is controlled with Tylenol and Ibuprofen.    Instructed to call office with any fever, chills, purulent drainage, uncontrolled pain or any other questions or concerns. Patient verbalizes understanding.   Pt aware of post op appointments as well as the office number [phone] and after hours number [phone] to call if she has any questions or concerns.

## 2023-04-12 ENCOUNTER — Other Ambulatory Visit: Payer: Self-pay | Admitting: Physician Assistant

## 2023-04-12 NOTE — Telephone Encounter (Signed)
Please call to schedule followup, was due in May.

## 2023-04-13 ENCOUNTER — Inpatient Hospital Stay: Payer: Medicare Other | Attending: Gynecologic Oncology | Admitting: Gynecologic Oncology

## 2023-04-13 ENCOUNTER — Encounter: Payer: Self-pay | Admitting: Oncology

## 2023-04-13 ENCOUNTER — Encounter: Payer: Self-pay | Admitting: Gynecologic Oncology

## 2023-04-13 DIAGNOSIS — Z90710 Acquired absence of both cervix and uterus: Secondary | ICD-10-CM

## 2023-04-13 DIAGNOSIS — Z90722 Acquired absence of ovaries, bilateral: Secondary | ICD-10-CM

## 2023-04-13 DIAGNOSIS — Z7189 Other specified counseling: Secondary | ICD-10-CM

## 2023-04-13 DIAGNOSIS — C541 Malignant neoplasm of endometrium: Secondary | ICD-10-CM

## 2023-04-13 NOTE — Progress Notes (Signed)
Requested MSI, MLH1 Hypermethylation and P53 on accession [id number] with [facility] via email.

## 2023-04-13 NOTE — Progress Notes (Signed)
Gynecologic Oncology Telehealth Note: Gyn-Onc  I connected with Judy [name] on 04/13/23 at  4:20 PM EDT by telephone and verified that I am speaking with the correct person using two identifiers.  I discussed the limitations, risks, security and privacy concerns of performing an evaluation and management service by telemedicine and the availability of in-person appointments. I also discussed with the patient that there may be a patient responsible charge related to this service. The patient expressed understanding and agreed to proceed.  Other persons participating in the visit and their role in the encounter: none.  Patient's location: home Provider's location: [facility]  Reason for Visit: follow-up  Treatment History: Oncology History Overview Note  01/12/23: Pap - atypical glandular cell, favor neoplastic. While overall cellularity of the pap is low, cells look highly atypical and are suspicious for carcinoma favored to be endometrial. HR HPV negative.    Pelvic ultrasound on 01/21/23 revealed a uterus measuring 7.5 x 5.4 x 7 cm.  Multiple fibroids noted measuring up to 3.8 cm.  Endometrium is thickened measuring 24 mm and irregular.  There is a frond-like contour appreciated adjacent to a small amount of endometrial free fluid and a focal masslike area with blood flow measuring up to 4.2 cm.  Left ovary not visualized.  Right ovary contains a 2.3 cm simple appearing cyst.   CT A/P on 02/07/23 reveals a heterogenous lobulated uterus. No evidence of metastatic disease. Minimal sigmoid diverticulosis without diverticulitis.   The patient had recent visit with pulmonology. 1.4% risk of postop pulmonary complication estimated with duration of surgery is less than 3 hours.   Endometrial cancer (HCC)  04/06/2023 Initial Diagnosis   Endometrial cancer (HCC)   04/06/2023 Surgery   Dilation and curettage, robotic-assisted laparoscopic total hysterectomy with bilateral salpingoophorectomy, SLN biopsy  bilaterally, peritoneal biopsy, mini-lap for specimen delivery   Findings: On EUA, 10-12 cm mobile uterus. On D&C moderate amount of tissue c/w tumor appreciated after curetting. On intra-abdominal entry, significant adhesions between bilateral liver surfaces and the anterior abdominal wall. Normal appearing stomach, small and large bowel, omentum. No ascites. No adenopathy. Uterus 10 cm with multiple 2-4 cm fibroids including right lateral LUS fibroid. Normal appearing adnexa. Miliary-appearing disease noted on the peritoneum overlying the left uterosacral ligament (resected). No other peritoneal findings. Mapping successful to bilateral obturator SLNs.  Frozen section from [facility] c/w endometroid cancer, likely low grade.   04/06/2023 Pathology Results   Stage IA2, grade 1 MI 30% Negative SLNs Focus suspicious for LVI?  MMR: loss of MLH1 and PMS2      Interval History: Doing well. Minimal discomfort. Normal bowel and bladder function. Denies vaginal bleeding. Still using IS.  Past Medical/Surgical History: Past Medical History:  Diagnosis Date   Allergic rhinitis    Anemia    Anxiety    Arthritis    Bipolar 1 disorder (HCC)    Cancer (HCC)    Depression    Diverticulosis of colon (without mention of hemorrhage) 08/25/2011   Dr. Dulce Sellar   Dyspnea    Hearing loss in left ear    since birth   Hyperlipidemia    Hypertension    resolved   Insomnia    Obesity    Palpitation    Renal disorder    Tardive dyskinesia    Tremor    Vertigo     Past Surgical History:  Procedure Laterality Date   COLONOSCOPY  08/25/2011   Procedure: COLONOSCOPY;  Surgeon: Freddy Jaksch, MD;  Location:  [facility];  Service: Endoscopy;  Laterality: N/A;   DILATION AND CURETTAGE OF UTERUS     DILATION AND CURETTAGE OF UTERUS N/A 04/06/2023   Procedure: DILATATION AND CURETTAGE;  Surgeon: Carver Fila, MD;  Location: [facility];  Service: Gynecology;  Laterality: N/A;   LYMPH NODE DISSECTION  N/A 04/06/2023   Procedure: MINI LAPAROTOMY;  Surgeon: Carver Fila, MD;  Location: [facility];  Service: Gynecology;  Laterality: N/A;   ROBOTIC ASSISTED TOTAL HYSTERECTOMY WITH BILATERAL SALPINGO OOPHERECTOMY N/A 04/06/2023   Procedure: XI ROBOTIC ASSISTED TOTAL HYSTERECTOMY WITH BILATERAL SALPINGO OOPHORECTOMY;  Surgeon: Carver Fila, MD;  Location: [facility];  Service: Gynecology;  Laterality: N/A;   SENTINEL NODE BIOPSY N/A 04/06/2023   Procedure: SENTINEL NODE BIOPSY;  Surgeon: Carver Fila, MD;  Location: [facility];  Service: Gynecology;  Laterality: N/A;    Family History  Problem Relation Age of Onset   Hypertension Mother    Kidney disease Mother    Stomach cancer Father    Hyperlipidemia Sister    Hypothyroidism Sister    Prostate cancer Brother    Hypertension Brother    Kidney disease Brother    Stroke Brother    Prostate cancer Brother    Breast cancer Neg Hx    Pancreatic cancer Neg Hx    Ovarian cancer Neg Hx    Endometrial cancer Neg Hx     Social History   Socioeconomic History   Marital status: Single    Spouse name: Not on file   Number of children: 1   Years of education: 12   Highest education level: Not on file  Occupational History   Occupation: Retired  Tobacco Use   Smoking status: Never   Smokeless tobacco: Never  Vaping Use   Vaping Use: Never used  Substance and Sexual Activity   Alcohol use: Never   Drug use: Never   Sexual activity: Not Currently  Other Topics Concern   Not on file  Social History Narrative   Lives alone in house, does not need assist device.  Works part time at [employer]. Has a son.     Cousin and sister in close proximity   Right handed   2 cups coffee per day   Social Determinants of Health   Financial Resource Strain: Low Risk  (12/16/2022)   Overall Financial Resource Strain (CARDIA)    Difficulty of Paying Living Expenses: Not hard at all  Food Insecurity: Food Insecurity Present  (04/07/2023)   Hunger Vital Sign    Worried About Running Out of Food in the Last Year: Never true    Ran Out of Food in the Last Year: Sometimes true  Transportation Needs: Unmet Transportation Needs (03/03/2023)   PRAPARE - Transportation    Lack of Transportation (Medical): Yes    Lack of Transportation (Non-Medical): Yes  Physical Activity: Insufficiently Active (12/16/2022)   Exercise Vital Sign    Days of Exercise per Week: 3 days    Minutes of Exercise per Session: 20 min  Stress: Stress Concern Present (12/16/2022)   [employer] of Occupational Health - Occupational Stress Questionnaire    Feeling of Stress : To some extent  Social Connections: Not on file    Current Medications:  Current Outpatient Medications:    acetaminophen (TYLENOL) 325 MG tablet, Take 2 tablets (650 mg total) by mouth every 6 (six) hours as needed for mild pain (or Fever >/= 101)., Disp: , Rfl:  albuterol (VENTOLIN HFA) 108 (90 Base) MCG/ACT inhaler, Inhale 2 puffs into the lungs every 6 (six) hours as needed for wheezing or shortness of breath. Use with spacer, Disp: 8 g, Rfl: 2   atorvastatin (LIPITOR) 10 MG tablet, Take 1 tablet (10 mg total) by mouth daily., Disp: 90 tablet, Rfl: 1   b complex vitamins capsule, Take 1 capsule by mouth daily. With C, Disp: , Rfl:    buPROPion (WELLBUTRIN XL) 150 MG 24 hr tablet, Take 1 tablet (150 mg total) by mouth daily., Disp: 90 tablet, Rfl: 0   cholecalciferol (VITAMIN D3) 25 MCG (1000 UNIT) tablet, Take 1,000 Units by mouth daily., Disp: , Rfl:    INGREZZA 80 MG capsule, Take 1 capsule (80 mg total) by mouth daily., Disp: 30 capsule, Rfl: 2   ipratropium-albuterol (DUONEB) 0.5-2.5 (3) MG/3ML SOLN, 1 treatment in the morning, 1 treatment in the evenings, every 6 hours as needed in between for shortness of breath or wheezing, Disp: 360 mL, Rfl: 3   meclizine (ANTIVERT) 12.5 MG tablet, Take 1 tablet (12.5 mg total) by mouth 3 (three) times daily as needed for  dizziness., Disp: 30 tablet, Rfl: 0   mirtazapine (REMERON) 7.5 MG tablet, TAKE 0.5-1 TABLETS (3.75-7.5 MG TOTAL) BY MOUTH AT BEDTIME AS NEEDED., Disp: 30 tablet, Rfl: 0   Omega-3 Fatty Acids (FISH OIL BURP-LESS PO), Take 700 mg by mouth daily. (Patient not taking: Reported on 03/30/2023), Disp: , Rfl:    polyethylene glycol (MIRALAX / GLYCOLAX) 17 g packet, Take 17 g by mouth daily. (Patient taking differently: Take 17 g by mouth daily as needed for moderate constipation or mild constipation.), Disp: 30 each, Rfl: 2   propranolol (INDERAL) 10 MG tablet, Take 1 tablet (10 mg total) by mouth 3 (three) times daily as needed. (Patient taking differently: Take 10 mg by mouth 3 (three) times daily as needed (unknown).), Disp: 90 tablet, Rfl: 6   SENNA PLUS 50-8.6 MG CAPS, TAKE 2 TABLETS BY MOUTH AT BEDTIME. FOR AFTER SURGERY, DO NOT TAKE IF HAVING DIARRHEA (Patient not taking: Reported on 03/30/2023), Disp: , Rfl:    senna-docusate (SENOKOT-S) 8.6-50 MG tablet, Take 2 tablets by mouth at bedtime. For AFTER surgery, do not take if having diarrhea (Patient not taking: Reported on 03/30/2023), Disp: 30 tablet, Rfl: 0   traMADol (ULTRAM) 50 MG tablet, Take 1 tablet (50 mg total) by mouth every 6 (six) hours as needed for severe pain. For AFTER surgery only, do not take and drive (Patient not taking: Reported on 03/30/2023), Disp: 10 tablet, Rfl: 0  Review of Symptoms: Pertinent positives as per HPI.  Physical Exam: Deferred given limitations of phone visit.  Laboratory & Radiologic Studies: None new  Assessment & Plan: Judy [name] is a 77 y.o. 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  Doing well. Reviewed continued expectations and restrictions. Reviewed pathology with her. Awaiting MSI and MLH1 promoter hypermehtylation testing. Will also ask for p53.   I discussed the assessment and treatment plan with the patient. The patient was provided  with an opportunity to ask questions and all were answered. The patient agreed with the plan and demonstrated an understanding of the instructions.   The patient was advised to call back or see an in-person evaluation if the symptoms worsen or if the condition fails to improve as anticipated.   6 minutes of total time was spent for this patient encounter, including preparation, phone counseling with  the patient and coordination of care, and documentation of the encounter.   Eugene Garnet, MD  Division of Gynecologic Oncology  Department of Obstetrics and Gynecology  [facility]

## 2023-04-13 NOTE — Telephone Encounter (Signed)
Pt is scheduled for 8/15 °

## 2023-04-15 ENCOUNTER — Other Ambulatory Visit: Payer: Self-pay

## 2023-04-18 LAB — SURGICAL PATHOLOGY

## 2023-04-28 DIAGNOSIS — J449 Chronic obstructive pulmonary disease, unspecified: Secondary | ICD-10-CM | POA: Diagnosis not present

## 2023-04-28 DIAGNOSIS — R0609 Other forms of dyspnea: Secondary | ICD-10-CM | POA: Diagnosis not present

## 2023-05-02 ENCOUNTER — Telehealth: Payer: Self-pay

## 2023-05-04 NOTE — Telephone Encounter (Signed)
Pt has apt 8/27

## 2023-05-05 ENCOUNTER — Inpatient Hospital Stay: Payer: Medicare Other | Admitting: Gynecologic Oncology

## 2023-05-05 ENCOUNTER — Encounter: Payer: Self-pay | Admitting: Gynecologic Oncology

## 2023-05-05 VITALS — BP 141/74 | HR 66 | Temp 98.9°F | Resp 22 | Ht 69.0 in | Wt 135.0 lb

## 2023-05-05 DIAGNOSIS — Z90710 Acquired absence of both cervix and uterus: Secondary | ICD-10-CM

## 2023-05-05 DIAGNOSIS — Z7189 Other specified counseling: Secondary | ICD-10-CM

## 2023-05-05 DIAGNOSIS — C541 Malignant neoplasm of endometrium: Secondary | ICD-10-CM

## 2023-05-05 DIAGNOSIS — Z90722 Acquired absence of ovaries, bilateral: Secondary | ICD-10-CM

## 2023-05-05 NOTE — Patient Instructions (Signed)
It was good to see you today.  You are healing well from surgery.  Please remember, no heavy lifting for 6 weeks and nothing in the vagina for at least 10 weeks.  Will see you for follow-up in 6 months.  If you develop any of the symptoms that we discussed which include vaginal bleeding, pelvic pain, change to bowel function, or unintentional weight loss, please call to see me sooner.

## 2023-05-05 NOTE — Progress Notes (Signed)
Gynecologic Oncology Return Clinic Visit  05/05/23  Reason for Visit: follow-up, treatment planning  Treatment History: Oncology History Overview Note  01/12/23: Pap - atypical glandular cell, favor neoplastic. While overall cellularity of the pap is low, cells look highly atypical and are suspicious for carcinoma favored to be endometrial. HR HPV negative.    Pelvic ultrasound on 01/21/23 revealed a uterus measuring 7.5 x 5.4 x 7 cm.  Multiple fibroids noted measuring up to 3.8 cm.  Endometrium is thickened measuring 24 mm and irregular.  There is a frond-like contour appreciated adjacent to a small amount of endometrial free fluid and a focal masslike area with blood flow measuring up to 4.2 cm.  Left ovary not visualized.  Right ovary contains a 2.3 cm simple appearing cyst.   CT A/P on 02/07/23 reveals a heterogenous lobulated uterus. No evidence of metastatic disease. Minimal sigmoid diverticulosis without diverticulitis.   The patient had recent visit with pulmonology. 1.4% risk of postop pulmonary complication estimated with duration of surgery is less than 3 hours.   Endometrial cancer (HCC)  04/06/2023 Initial Diagnosis   Endometrial cancer (HCC)   04/06/2023 Surgery   Dilation and curettage, robotic-assisted laparoscopic total hysterectomy with bilateral salpingoophorectomy, SLN biopsy bilaterally, peritoneal biopsy, mini-lap for specimen delivery   Findings: On EUA, 10-12 cm mobile uterus. On D&C moderate amount of tissue c/w tumor appreciated after curetting. On intra-abdominal entry, significant adhesions between bilateral liver surfaces and the anterior abdominal wall. Normal appearing stomach, small and large bowel, omentum. No ascites. No adenopathy. Uterus 10 cm with multiple 2-4 cm fibroids including right lateral LUS fibroid. Normal appearing adnexa. Miliary-appearing disease noted on the peritoneum overlying the left uterosacral ligament (resected). No other peritoneal findings.  Mapping successful to bilateral obturator SLNs.  Frozen section from [facility] c/w endometroid cancer, likely low grade.   04/06/2023 Pathology Results   Stage IA2, grade 1 MI 30% Negative SLNs Focus suspicious for LVI?  MMR: loss of MLH1 and PMS2      Interval History: Reports doing well.  Denies any abdominal or pelvic pain.  Denies any vaginal bleeding or discharge.  Reports baseline bowel bladder function.  During the visit, appeared to be breathing harder and using accessory muscles.  Had some twitching of her right eye when we laid her down initially for an exam.  Past Medical/Surgical History: Past Medical History:  Diagnosis Date   Allergic rhinitis    Anemia    Anxiety    Arthritis    Bipolar 1 disorder (HCC)    Cancer (HCC)    Depression    Diverticulosis of colon (without mention of hemorrhage) 08/25/2011   Dr. Dulce Sellar   Dyspnea    Hearing loss in left ear    since birth   Hyperlipidemia    Hypertension    resolved   Insomnia    Obesity    Palpitation    Renal disorder    Tardive dyskinesia    Tremor    Vertigo     Past Surgical History:  Procedure Laterality Date   COLONOSCOPY  08/25/2011   Procedure: COLONOSCOPY;  Surgeon: Freddy Jaksch, MD;  Location: [facility];  Service: Endoscopy;  Laterality: N/A;   DILATION AND CURETTAGE OF UTERUS     DILATION AND CURETTAGE OF UTERUS N/A 04/06/2023   Procedure: DILATATION AND CURETTAGE;  Surgeon: Carver Fila, MD;  Location: [facility];  Service: Gynecology;  Laterality: N/A;   LYMPH NODE DISSECTION N/A 04/06/2023   Procedure: MINI  LAPAROTOMY;  Surgeon: Carver Fila, MD;  Location: [facility];  Service: Gynecology;  Laterality: N/A;   ROBOTIC ASSISTED TOTAL HYSTERECTOMY WITH BILATERAL SALPINGO OOPHERECTOMY N/A 04/06/2023   Procedure: XI ROBOTIC ASSISTED TOTAL HYSTERECTOMY WITH BILATERAL SALPINGO OOPHORECTOMY;  Surgeon: Carver Fila, MD;  Location: [facility];  Service: Gynecology;  Laterality: N/A;    SENTINEL NODE BIOPSY N/A 04/06/2023   Procedure: SENTINEL NODE BIOPSY;  Surgeon: Carver Fila, MD;  Location: [facility];  Service: Gynecology;  Laterality: N/A;    Family History  Problem Relation Age of Onset   Hypertension Mother    Kidney disease Mother    Stomach cancer Father    Hyperlipidemia Sister    Hypothyroidism Sister    Prostate cancer Brother    Hypertension Brother    Kidney disease Brother    Stroke Brother    Prostate cancer Brother    Breast cancer Neg Hx    Pancreatic cancer Neg Hx    Ovarian cancer Neg Hx    Endometrial cancer Neg Hx     Social History   Socioeconomic History   Marital status: Single    Spouse name: Not on file   Number of children: 1   Years of education: 12   Highest education level: Not on file  Occupational History   Occupation: Retired  Tobacco Use   Smoking status: Never   Smokeless tobacco: Never  Vaping Use   Vaping status: Never Used  Substance and Sexual Activity   Alcohol use: Never   Drug use: Never   Sexual activity: Not Currently  Other Topics Concern   Not on file  Social History Narrative   Lives alone in house, does not need assist device.  Works part time at [employer]. Has a son.     Cousin and sister in close proximity   Right handed   2 cups coffee per day   Social Determinants of Health   Financial Resource Strain: Low Risk  (12/16/2022)   Overall Financial Resource Strain (CARDIA)    Difficulty of Paying Living Expenses: Not hard at all  Food Insecurity: Food Insecurity Present (04/07/2023)   Hunger Vital Sign    Worried About Running Out of Food in the Last Year: Never true    Ran Out of Food in the Last Year: Sometimes true  Transportation Needs: Unmet Transportation Needs (03/03/2023)   PRAPARE - Transportation    Lack of Transportation (Medical): Yes    Lack of Transportation (Non-Medical): Yes  Physical Activity: Insufficiently Active (12/16/2022)   Exercise Vital Sign    Days  of Exercise per Week: 3 days    Minutes of Exercise per Session: 20 min  Stress: Stress Concern Present (12/16/2022)   [employer] of Occupational Health - Occupational Stress Questionnaire    Feeling of Stress : To some extent  Social Connections: Unknown (02/23/2022)   [facility] from [facility]   Social Network    Social Network: Not on file    Current Medications:  Current Outpatient Medications:    acetaminophen (TYLENOL) 325 MG tablet, Take 2 tablets (650 mg total) by mouth every 6 (six) hours as needed for mild pain (or Fever >/= 101)., Disp: , Rfl:    albuterol (VENTOLIN HFA) 108 (90 Base) MCG/ACT inhaler, Inhale 2 puffs into the lungs every 6 (six) hours as needed for wheezing or shortness of breath. Use with spacer, Disp: 8 g, Rfl: 2   b complex vitamins capsule, Take 1  capsule by mouth daily. With C, Disp: , Rfl:    buPROPion (WELLBUTRIN XL) 150 MG 24 hr tablet, Take 1 tablet (150 mg total) by mouth daily., Disp: 90 tablet, Rfl: 0   cholecalciferol (VITAMIN D3) 25 MCG (1000 UNIT) tablet, Take 1,000 Units by mouth daily., Disp: , Rfl:    INGREZZA 80 MG capsule, Take 1 capsule (80 mg total) by mouth daily., Disp: 30 capsule, Rfl: 2   ipratropium-albuterol (DUONEB) 0.5-2.5 (3) MG/3ML SOLN, 1 treatment in the morning, 1 treatment in the evenings, every 6 hours as needed in between for shortness of breath or wheezing, Disp: 360 mL, Rfl: 3   meclizine (ANTIVERT) 12.5 MG tablet, Take 1 tablet (12.5 mg total) by mouth 3 (three) times daily as needed for dizziness., Disp: 30 tablet, Rfl: 0   mirtazapine (REMERON) 7.5 MG tablet, TAKE 0.5-1 TABLETS (3.75-7.5 MG TOTAL) BY MOUTH AT BEDTIME AS NEEDED., Disp: 90 tablet, Rfl: 0   polyethylene glycol (MIRALAX / GLYCOLAX) 17 g packet, Take 17 g by mouth daily. (Patient taking differently: Take 17 g by mouth daily as needed for moderate constipation or mild constipation.), Disp: 30 each, Rfl: 2   propranolol (INDERAL) 10 MG tablet, Take 1 tablet  (10 mg total) by mouth 3 (three) times daily as needed. (Patient taking differently: Take 10 mg by mouth 3 (three) times daily as needed (unknown).), Disp: 90 tablet, Rfl: 6   senna-docusate (SENOKOT-S) 8.6-50 MG tablet, Take 2 tablets by mouth at bedtime. For AFTER surgery, do not take if having diarrhea, Disp: 30 tablet, Rfl: 0   traMADol (ULTRAM) 50 MG tablet, Take 1 tablet (50 mg total) by mouth every 6 (six) hours as needed for severe pain. For AFTER surgery only, do not take and drive, Disp: 10 tablet, Rfl: 0   Omega-3 Fatty Acids (FISH OIL BURP-LESS PO), Take 700 mg by mouth daily. (Patient not taking: Reported on 03/30/2023), Disp: , Rfl:   Review of Systems: Denies appetite changes, fevers, chills, fatigue, unexplained weight changes. Denies hearing loss, neck lumps or masses, mouth sores, ringing in ears or voice changes. Denies cough or wheezing.  Denies shortness of breath. Denies chest pain or palpitations. Denies leg swelling. Denies abdominal distention, pain, blood in stools, constipation, diarrhea, nausea, vomiting, or early satiety. Denies pain with intercourse, dysuria, frequency, hematuria or incontinence. Denies hot flashes, pelvic pain, vaginal bleeding or vaginal discharge.   Denies joint pain, back pain or muscle pain/cramps. Denies itching, rash, or wounds. Denies dizziness, headaches, numbness or seizures. Denies swollen lymph nodes or glands, denies easy bruising or bleeding. Denies anxiety, depression, confusion, or decreased concentration.  Physical Exam: BP (!) 141/74 (BP Location: Left Arm, Patient Position: Sitting)   Pulse 66   Temp 98.9 F (37.2 C) (Oral)   Resp (!) 22   Ht 5\' 9"  (1.753 m)   Wt 135 lb (61.2 kg)   SpO2 100%   BMI 19.94 kg/m  General: Alert, oriented.  No acute distress although breathing faster and using accessory muscles. HEENT: Posterior oropharynx clear, sclera anicteric.  Periods of blinking quickly with her right eye. Chest: Clear  to auscultation bilaterally.  No wheezes or rhonchi. Cardiovascular: Regular rate and rhythm, no murmurs. Abdomen: soft, nontender.  Normoactive bowel sounds.  No masses or hepatosplenomegaly appreciated.  Well-healed incisions. Extremities: Grossly normal range of motion.  Warm, well perfused.  No edema bilaterally. GU: Normal appearing external genitalia without erythema, excoriation, or lesions.  Speculum exam reveals cuff intact, suture visible.  Bimanual exam reveals intact, no fluctuance or tenderness to palpation.   Laboratory & Radiologic Studies: A. ENDOMETRIUM, CURETTAGE: Endometrioid adenocarcinoma, FIGO grade 1  B. SENTINEL LYMPH NODE, LEFT OBTURATOR, EXCISION: One lymph node negative for metastatic carcinoma (0/1) Focal capsular endosalpingosis  C. SENTINEL LYMPH NODE, RIGHT OBTURATOR, EXCISION: One lymph node negative for metastatic carcinoma (0/1)  D. CUL DE SAC PERITONEUM, POSTERIOR, EXCISION: Endometriosis with associated calcifications Negative for malignancy  E. UTERUS, CERVIX, BILATERAL FALLOPIAN TUBES AND OVARIES: Endometrium     Endometrioid adenocarcinoma, 5.2 cm     Carcinoma invades myometrium to a depth of 0.4 cm (30%)(pT1a)     Microscopic focus suspicious for lymphovascular space involvement     Negative serosal involvement     See oncology table and comment Cervix     Unremarkable, negative for carcinoma Myometrium     Leiomyomata, negative for carcinoma Bilateral ovaries     Unremarkable, negative for carcinoma Bilateral fallopian tubes     Unremarkable, negative for carcinoma  ONCOLOGY TABLE: UTERUS, CARCINOMA OR CARCINOSARCOMA: Resection Procedure: Total hysterectomy with bilateral salpingo-oophorectomy and sentinel lymph nodes Histologic Type: Endometrioid Histologic Grade: FIGO grade 1, see comment Myometrial Invasion:      Depth of Myometrial Invasion (mm): 4 mm      Myometrial Thickness (mm): 12 mm      Percentage of Myometrial  Invasion: 30% Uterine Serosa Involvement: Not identified Cervical stromal Involvement: Not identified Extent of involvement of other tissue/organs: Not identified Peritoneal/Ascitic Fluid: Pending Lymphovascular Invasion: Microscopic focus suspicious, non-substantial Regional Lymph Nodes:      Pelvic Lymph Nodes Examined:          2 Sentinel          0 non-sentinel          2 total      Pelvic Lymph Nodes with Metastasis: 0          Macrometastasis: (>2.0 mm): 0          Micrometastasis: (>0.2 mm and < 2.0 mm): 0          Isolated Tumor Cells (<0.2 mm): 0 Distant Metastasis:      Distant Site(s) Involved: Not applicable Pathologic Stage Classification (pTNM, AJCC 8th Edition): pT1a, pN0 Ancillary Studies: MMR with reflex to MSI testing has been ordered Representative Tumor Block: D3-D5 Comment(s): There is a small portion of the tumor with solid architecture which is less than 5% of the total tumor consistent with FIGO grade 1.  Focally the tumor has a microcytic, elongated and fragmented pattern of invasion (M ELF).  Cytokeratin AE1/AE3 performed on the sentinel lymph nodes (parts B and C) is negative for metastatic carcinoma. (v4.2.0.1)  Assessment & Plan: Judy [name] is a 77 y.o. 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 ? Single focus of LVI. MSI-H, MLH1 hypermethylation present. P53 WT.  The patient is doing very well postoperatively.  Discussed continued restrictions and expectations.  Ultimately, after the exam and sitting in the room for a period of time, the patient's breathing returned more to normal.  This was confirmed by her family present.  Her brother notes that she gets anxious and that some of the symptoms she was exhibiting today were likely related to her anxiety.  The patient denied feeling short of breath after our exam at the end of the visit.  I discussed with the patient and her family signs and  symptoms that would be concerning from a breathing standpoint  and should send them to the emergency department.  Given her diagnosis and family history, referral to genetics discussed and placed today.  Reviewed her pathology from surgery as well as findings during surgery.  The patient was given a copy of her pathology report.  No adjuvant therapy recommended.  Per NCCN surveillance recommendations, discussed plan for surveillance visits every 6 months.  We will alternate these between my office and her OB/GYN.  I will see her for her first visit in 6 months.  We reviewed signs and symptoms that should prompt a phone call between visits and would be concerning for cancer recurrence.  20 minutes of total time was spent for this patient encounter, including preparation, face-to-face counseling with the patient and coordination of care, and documentation of the encounter.  Eugene Garnet, MD  Division of Gynecologic Oncology  Department of Obstetrics and Gynecology  [facility]

## 2023-05-06 ENCOUNTER — Other Ambulatory Visit: Payer: Self-pay | Admitting: Obstetrics & Gynecology

## 2023-05-06 DIAGNOSIS — N950 Postmenopausal bleeding: Secondary | ICD-10-CM

## 2023-05-07 ENCOUNTER — Other Ambulatory Visit: Payer: Self-pay | Admitting: Internal Medicine

## 2023-05-09 ENCOUNTER — Other Ambulatory Visit: Payer: Self-pay | Admitting: Physician Assistant

## 2023-05-09 ENCOUNTER — Telehealth: Payer: Self-pay | Admitting: Genetic Counselor

## 2023-05-09 ENCOUNTER — Ambulatory Visit: Payer: Medicare Other | Admitting: Cardiology

## 2023-05-09 ENCOUNTER — Other Ambulatory Visit: Payer: Self-pay

## 2023-05-09 MED ORDER — INGREZZA 80 MG PO CAPS: 80.0000 mg | ORAL_CAPSULE | Freq: Every day | ORAL | 1 refills | Status: DC

## 2023-05-09 NOTE — Telephone Encounter (Signed)
Called to reschedule appointment with Genetics Counselor; Patient's son has stated due to conflicts with personal schedules and also the patient other appointments conflict they would be unable to reschedule appointment at the moment, patient has also stated they would callback when they were ready to reschedule appointment with [facility] Counselor

## 2023-05-18 ENCOUNTER — Inpatient Hospital Stay: Payer: Medicare Other

## 2023-05-18 ENCOUNTER — Inpatient Hospital Stay: Payer: Medicare Other | Admitting: Genetic Counselor

## 2023-05-26 ENCOUNTER — Ambulatory Visit: Payer: Medicare Other | Admitting: Physician Assistant

## 2023-05-29 DIAGNOSIS — J449 Chronic obstructive pulmonary disease, unspecified: Secondary | ICD-10-CM | POA: Diagnosis not present

## 2023-05-29 DIAGNOSIS — R0609 Other forms of dyspnea: Secondary | ICD-10-CM | POA: Diagnosis not present

## 2023-06-04 ENCOUNTER — Other Ambulatory Visit: Payer: Self-pay | Admitting: Physician Assistant

## 2023-06-05 NOTE — Telephone Encounter (Signed)
 Has appt 8/27

## 2023-06-07 ENCOUNTER — Encounter: Payer: Self-pay | Admitting: Physician Assistant

## 2023-06-07 ENCOUNTER — Ambulatory Visit: Payer: Medicare Other | Admitting: Physician Assistant

## 2023-06-07 DIAGNOSIS — G2401 Drug induced subacute dyskinesia: Secondary | ICD-10-CM | POA: Diagnosis not present

## 2023-06-07 DIAGNOSIS — F331 Major depressive disorder, recurrent, moderate: Secondary | ICD-10-CM | POA: Diagnosis not present

## 2023-06-07 MED ORDER — BUPROPION HCL ER (XL) 150 MG PO TB24: 150.0000 mg | ORAL_TABLET | Freq: Every day | ORAL | 0 refills | Status: DC

## 2023-06-07 NOTE — Progress Notes (Signed)
[facility] Med Check  Patient ID: Judy [name],  MRN: [id number]  PCP: Arnette Felts, FNP  Date of Evaluation: 06/07/2023 Time spent:30 minutes  Chief Complaint:  Chief Complaint   Depression; Follow-up    HISTORY/CURRENT STATUS: HPI For routine med check. Brother and sister-in-law accompany her.   Patient states she is depressed.  She does not feel like doing anything.  All she does is sit around and listen to the radio, watch the prices right on TV, a couple of times a week she takes her garbage out and walks for 5 minutes, she does her laundry a few days a week.  Occasionally does dishes.  She lives alone, her brother and sister-in-law help as much as they can.  They are concerned that she feels like this because she is not trying to do anything else.  They states she has been told what to do and how to feel all of her life and they want her to be as independent as possible.  They plan to move to [place] to be closer to their kids and grandkids when the opportunity comes up.  No one will be around to take care of her or help her out.  Patient sleeps okay.  Still feels tired all the time.  Appetite is normal if it is something that she wants to eat but if not then she will not eat.  Personal hygiene is normal.  No complaints of anxiety.  No suicidal or homicidal thoughts.  Patient denies increased energy with decreased need for sleep, increased talkativeness, racing thoughts, impulsivity or risky behaviors, increased spending, increased libido, grandiosity, increased irritability or anger, paranoia, or hallucinations.  She has been on Ingrezza for several years.  She was taken off the antipsychotic but she felt like the TV was still present and wanted to continue the Ingrezza.  Her brother and sister-in-law question whether she really needs it.  They do not want her to be on any medication that she does not need.  Which is understandable of course. Denies dizziness, syncope, seizures,  numbness, tingling, tremor, tics, unsteady gait, slurred speech, confusion. Denies muscle or joint pain, stiffness, or dystonia. Denies unexplained weight loss, frequent infections, or sores that heal slowly.  No polyphagia, polydipsia, or polyuria. Denies visual changes or paresthesias.   Individual Medical History/ Review of Systems: Changes? :Yes     Hysterectomy 03/2023.  Past medications for mental health diagnoses include: Latuda, Seroquel, Prozac, Fetzima, Risperdal, gabapentin, Wellbutrin  Allergies: Patient has no known allergies.  Current Medications:  Current Outpatient Medications:    acetaminophen (TYLENOL) 325 MG tablet, Take 2 tablets (650 mg total) by mouth every 6 (six) hours as needed for mild pain (or Fever >/= 101)., Disp: , Rfl:    albuterol (VENTOLIN HFA) 108 (90 Base) MCG/ACT inhaler, Inhale 2 puffs into the lungs every 6 (six) hours as needed for wheezing or shortness of breath. Use with spacer, Disp: 8 g, Rfl: 2   b complex vitamins capsule, Take 1 capsule by mouth daily. With C, Disp: , Rfl:    cholecalciferol (VITAMIN D3) 25 MCG (1000 UNIT) tablet, Take 1,000 Units by mouth daily., Disp: , Rfl:    INGREZZA 80 MG capsule, Take 1 capsule (80 mg total) by mouth daily., Disp: 30 capsule, Rfl: 1   ipratropium-albuterol (DUONEB) 0.5-2.5 (3) MG/3ML SOLN, 1 treatment in the morning, 1 treatment in the evenings, every 6 hours as needed in between for shortness of breath or wheezing, Disp: 360 mL, Rfl:  3   mirtazapine (REMERON) 7.5 MG tablet, TAKE 0.5-1 TABLETS (3.75-7.5 MG TOTAL) BY MOUTH AT BEDTIME AS NEEDED., Disp: 90 tablet, Rfl: 0   polyethylene glycol (MIRALAX / GLYCOLAX) 17 g packet, Take 17 g by mouth daily. (Patient taking differently: Take 17 g by mouth daily as needed for moderate constipation or mild constipation.), Disp: 30 each, Rfl: 2   propranolol (INDERAL) 10 MG tablet, TAKE 1 TABLET BY MOUTH THREE TIMES A DAY AS NEEDED, Disp: 270 tablet, Rfl: 2   senna-docusate  (SENOKOT-S) 8.6-50 MG tablet, Take 2 tablets by mouth at bedtime. For AFTER surgery, do not take if having diarrhea, Disp: 30 tablet, Rfl: 0   buPROPion (WELLBUTRIN XL) 150 MG 24 hr tablet, Take 1 tablet (150 mg total) by mouth daily., Disp: 90 tablet, Rfl: 0   meclizine (ANTIVERT) 12.5 MG tablet, Take 1 tablet (12.5 mg total) by mouth 3 (three) times daily as needed for dizziness., Disp: 30 tablet, Rfl: 0   Omega-3 Fatty Acids (FISH OIL BURP-LESS PO), Take 700 mg by mouth daily. (Patient not taking: Reported on 03/30/2023), Disp: , Rfl:    traMADol (ULTRAM) 50 MG tablet, Take 1 tablet (50 mg total) by mouth every 6 (six) hours as needed for severe pain. For AFTER surgery only, do not take and drive (Patient not taking: Reported on 06/07/2023), Disp: 10 tablet, Rfl: 0 Medication Side Effects:  see HPI  Family Medical/ Social History: Changes? no  MENTAL HEALTH EXAM:  There were no vitals taken for this visit.There is no height or weight on file to calculate BMI.  General Appearance: Casual, Neat and Well Groomed  Eye Contact:  Good  Speech:  Clear and Coherent and Normal Rate  Volume:  Normal  Mood:  Euthymic  Affect:  Appropriate  Thought Process:  Goal Directed and Descriptions of Associations: Circumstantial  Orientation:  Full (Time, Place, and Person)  Thought Content: Logical   Suicidal Thoughts:  No  Homicidal Thoughts:  No  Memory:   fair  Judgement:  Fair  Insight:  Fair  Psychomotor Activity:   She walks slowly with the roll later, no abnormal mouth movements, no truncal movements or visible tics  Concentration:  Concentration: Good  Recall:  Fair  Fund of Knowledge: Good  Language: Good  Assets:  Desire for Improvement  ADL's:  Impaired  Cognition: WNL  Prognosis:  Fair   DIAGNOSES:    ICD-10-CM   1. Major depressive disorder, recurrent episode, moderate (HCC)  F33.1     2. Tardive dyskinesia  G24.01       Receiving Psychotherapy: No   RECOMMENDATIONS:  PDMP  was reviewed.  Gabapentin filled 06/09/2022. I provided 30 minutes of face to face time during this encounter, including time spent before and after the visit in records review, medical decision making, counseling pertinent to today's visit, and charting.   We had a long discussion about her symptoms.  My first thought would be to increase the Wellbutrin, however I understand patient's brother and sister-in-law's concerns about medication that she may not need.  So instead of increasing the Wellbutrin, I will wean off the Ingrezza, reevaluate in 4 to 6 weeks and see if the Wellbutrin needs to be increased.  In the meantime I have encouraged Kyndle to do things more, walk around the house if she does not feel safe going outside or if the weather is too hot.  She needs to make a list every morning of things that she wants to  do that day.  It does not have to be a long list, complicated or strenuous.  But she needs to have a purpose for the day if it is doing a load of laundry, cleaning her kitchen, or what ever.  Patient states she will try.  Sherilyn Cooter again requests a firm diagnosis.  She was diagnosed at one point with bipolar disorder, major depressive disorder, and tardive dyskinesia.  He wants to know which one she has.  I explained that unfortunately in psychiatry we do not have blood test or scans that we can use to help [place] with diagnosis or to assess treatment.  Such as a pharyngeal swab checking for mono or strep, lab work looking for elevated glucose, blood pressure monitoring, etc.  He understands.  I explained that I can only go by the patient's history and symptoms at the time so it is good that he and Alona Bene are with her to give me more information of how she is at home.    Wean off the Ingrezza, 40 mg, 1 p.o. daily for 7 days sample pack given.  Then stop. No other changes will be made. Continue Wellbutrin XL 150 mg, 1 q am.  Continue mirtazapine 7.5 mg, 1/2-1 p.o. nightly as needed sleep. Continue  multivitamin, B complex, vitamin D, fish oil. Return in 6 weeks.   Melony Overly, PA-C

## 2023-06-24 ENCOUNTER — Encounter: Payer: Self-pay | Admitting: Cardiology

## 2023-06-24 ENCOUNTER — Other Ambulatory Visit: Payer: Self-pay | Admitting: Nurse Practitioner

## 2023-06-24 ENCOUNTER — Ambulatory Visit: Payer: Medicare Other | Admitting: Cardiology

## 2023-06-24 VITALS — BP 144/89 | HR 68 | Resp 16 | Ht 69.0 in | Wt 124.2 lb

## 2023-06-24 DIAGNOSIS — R0609 Other forms of dyspnea: Secondary | ICD-10-CM

## 2023-06-24 DIAGNOSIS — R072 Precordial pain: Secondary | ICD-10-CM | POA: Insufficient documentation

## 2023-06-24 MED ORDER — ASPIRIN 81 MG PO TBEC: 81.0000 mg | DELAYED_RELEASE_TABLET | Freq: Every day | ORAL | 3 refills | Status: AC

## 2023-06-24 MED ORDER — METOPROLOL SUCCINATE ER 25 MG PO TB24: 25.0000 mg | ORAL_TABLET | Freq: Every day | ORAL | 3 refills | Status: AC

## 2023-06-24 NOTE — Progress Notes (Signed)
Follow up visit  Subjective:   Judy [name], female    DOB: 07/17/1946, 77 y.o.   MRN: [id number]   HPI  Chief Complaint  Patient presents with   Hypertension   Follow-up    77 y.o. African American female with hypertension, h/o PE (2021), exertional dyspnea, abnormal stress test  Patient underwent successful surgery for endometrial mass.  Has no postop issues.  She continues to have exertional dyspnea, associated with "phlegm" in her throat, and also complains of chest pain.  Blood pressure is elevated today, usually is normal.  Patient is here with her brother and sister-in-law.  Patient has learning disability and has difficulty understanding instructions, as per family.     Current Outpatient Medications:    acetaminophen (TYLENOL) 325 MG tablet, Take 2 tablets (650 mg total) by mouth every 6 (six) hours as needed for mild pain (or Fever >/= 101)., Disp: , Rfl:    albuterol (VENTOLIN HFA) 108 (90 Base) MCG/ACT inhaler, Inhale 2 puffs into the lungs every 6 (six) hours as needed for wheezing or shortness of breath. Use with spacer, Disp: 8 g, Rfl: 2   b complex vitamins capsule, Take 1 capsule by mouth daily. With C, Disp: , Rfl:    buPROPion (WELLBUTRIN XL) 150 MG 24 hr tablet, Take 1 tablet (150 mg total) by mouth daily., Disp: 90 tablet, Rfl: 0   cholecalciferol (VITAMIN D3) 25 MCG (1000 UNIT) tablet, Take 1,000 Units by mouth daily., Disp: , Rfl:    INGREZZA 80 MG capsule, Take 1 capsule (80 mg total) by mouth daily., Disp: 30 capsule, Rfl: 1   ipratropium-albuterol (DUONEB) 0.5-2.5 (3) MG/3ML SOLN, 1 treatment in the morning, 1 treatment in the evenings, every 6 hours as needed in between for shortness of breath or wheezing, Disp: 360 mL, Rfl: 3   meclizine (ANTIVERT) 12.5 MG tablet, Take 1 tablet (12.5 mg total) by mouth 3 (three) times daily as needed for dizziness., Disp: 30 tablet, Rfl: 0   mirtazapine (REMERON) 7.5 MG tablet, TAKE 0.5-1 TABLETS (3.75-7.5 MG TOTAL)  BY MOUTH AT BEDTIME AS NEEDED., Disp: 90 tablet, Rfl: 0   Omega-3 Fatty Acids (FISH OIL BURP-LESS PO), Take 700 mg by mouth daily. (Patient not taking: Reported on 03/30/2023), Disp: , Rfl:    polyethylene glycol (MIRALAX / GLYCOLAX) 17 g packet, Take 17 g by mouth daily. (Patient taking differently: Take 17 g by mouth daily as needed for moderate constipation or mild constipation.), Disp: 30 each, Rfl: 2   propranolol (INDERAL) 10 MG tablet, TAKE 1 TABLET BY MOUTH THREE TIMES A DAY AS NEEDED, Disp: 270 tablet, Rfl: 2   senna-docusate (SENOKOT-S) 8.6-50 MG tablet, Take 2 tablets by mouth at bedtime. For AFTER surgery, do not take if having diarrhea, Disp: 30 tablet, Rfl: 0   traMADol (ULTRAM) 50 MG tablet, Take 1 tablet (50 mg total) by mouth every 6 (six) hours as needed for severe pain. For AFTER surgery only, do not take and drive (Patient not taking: Reported on 06/07/2023), Disp: 10 tablet, Rfl: 0   Cardiovascular & other pertient studies:  Reviewed external labs and tests, independently interpreted  EKG 03/30/2023: Sinus rhythm 64 bpm with sinus arrhtymia Possible old anteroseptal infarct Nonspecific T-abnormality  Lexiscan Nuclear stress test 07/19/2022: Myocardial perfusion is abnormal. There is a fixed mild defect in the inferior and septal regions. There is a partially reversible mild defect in the apical region.  Overall LV systolic function is abnormal without regional wall motion  abnormalities. Mild global hypokinesis of the left ventricle. Stress LV EF is mildly dysfunctional 45%.  Non-diagnostic ECG stress. The heart rate response was consistent with Regadenoson.  Report only comparison from 10/19/2016 reviewed similar inferior and inferolateral defect with partial reversibility with EF 60%. Low risk.    Echocardiogram 04/21/2022: 1. Left ventricular ejection fraction, by estimation, is 60 to 65%. The  left ventricle has normal function. The left ventricle has no regional  wall  motion abnormalities. There is moderate concentric left ventricular  hypertrophy. Left ventricular  diastolic parameters are consistent with Grade I diastolic dysfunction  (impaired relaxation).   2. Right ventricular systolic function is normal. The right ventricular  size is normal. There is normal pulmonary artery systolic pressure. The  estimated right ventricular systolic pressure is 29.8 mmHg.   3. The mitral valve is normal in structure. Trivial mitral valve  regurgitation. No evidence of mitral stenosis.   4. The aortic valve is normal in structure. Aortic valve regurgitation is  not visualized. No aortic stenosis is present.   5. The inferior vena cava is normal in size with greater than 50%  respiratory variability, suggesting right atrial pressure of 3 mmHg.    Recent labs: 03/24/2023: Glucose 108, BUN/Cr 16/0.95. EGFR >60. Na/K 142/4.0. Rest of the CMP normal H/H 14/42. MCV 94. Platelets 237  10/2022: HbA1C 5.6% Chol 151, TG 86, HDL 72, LDL 63 TSH 0.9 normal    Review of Systems  Cardiovascular:  Positive for chest pain and dyspnea on exertion (Improved). Negative for leg swelling, palpitations and syncope.         Vitals:   06/24/23 1115  BP: (!) 144/89  Pulse: 68  Resp: 16  SpO2: 98%     Body mass index is 18.34 kg/m. Filed Weights   06/24/23 1115  Weight: 124 lb 3.2 oz (56.3 kg)      Objective:   Physical Exam Vitals and nursing note reviewed.  Constitutional:      General: She is not in acute distress. Neck:     Vascular: No JVD.  Cardiovascular:     Rate and Rhythm: Normal rate and regular rhythm.     Heart sounds: Normal heart sounds. No murmur heard. Pulmonary:     Effort: Pulmonary effort is normal.     Breath sounds: Normal breath sounds. No wheezing or rales.  Musculoskeletal:     Right lower leg: No edema.     Left lower leg: No edema.             Visit diagnoses:   ICD-10-CM   1. Precordial pain  R07.2         Meds ordered this encounter  Medications   aspirin EC 81 MG tablet    Sig: Take 1 tablet (81 mg total) by mouth daily. Swallow whole.    Dispense:  90 tablet    Refill:  3   metoprolol succinate (TOPROL-XL) 25 MG 24 hr tablet    Sig: Take 1 tablet (25 mg total) by mouth daily. Take with or immediately following a meal.    Dispense:  90 tablet    Refill:  3      Assessment & Recommendations:   76 y.o. African American female with hypertension, h/o PE (2021), exertional dyspnea, abnormal stress test  Chest pain: Atypical, but has prior abnormal stress test. Recommend aspirin 81 mg daily, metoprolol succinate 25 mg daily. Family is not sure she will be able to follow instructions regarding sublingual nitroglycerin,  therefore not prescribed. I suspect some of her symptoms could be related to GERD.  Continue follow-up with PCP.  I will see her in 3 months for follow-up of possible anginal symptoms.     Elder Negus, MD Pager: [phone] Office: [phone]

## 2023-06-30 ENCOUNTER — Ambulatory Visit: Payer: Medicare Other | Admitting: Cardiology

## 2023-07-04 ENCOUNTER — Ambulatory Visit: Payer: Medicare Other | Admitting: Nurse Practitioner

## 2023-07-04 ENCOUNTER — Telehealth: Payer: Self-pay | Admitting: Physician Assistant

## 2023-07-04 ENCOUNTER — Encounter: Payer: Self-pay | Admitting: Nurse Practitioner

## 2023-07-04 ENCOUNTER — Other Ambulatory Visit: Payer: Self-pay

## 2023-07-04 VITALS — BP 128/70 | HR 69 | Temp 98.7°F | Ht 69.0 in | Wt 128.8 lb

## 2023-07-04 DIAGNOSIS — R0989 Other specified symptoms and signs involving the circulatory and respiratory systems: Secondary | ICD-10-CM | POA: Insufficient documentation

## 2023-07-04 DIAGNOSIS — G8191 Hemiplegia, unspecified affecting right dominant side: Secondary | ICD-10-CM | POA: Diagnosis not present

## 2023-07-04 DIAGNOSIS — R630 Anorexia: Secondary | ICD-10-CM | POA: Insufficient documentation

## 2023-07-04 DIAGNOSIS — Z2821 Immunization not carried out because of patient refusal: Secondary | ICD-10-CM | POA: Diagnosis not present

## 2023-07-04 DIAGNOSIS — Z23 Encounter for immunization: Secondary | ICD-10-CM | POA: Insufficient documentation

## 2023-07-04 DIAGNOSIS — R531 Weakness: Secondary | ICD-10-CM | POA: Diagnosis not present

## 2023-07-04 DIAGNOSIS — R0609 Other forms of dyspnea: Secondary | ICD-10-CM

## 2023-07-04 DIAGNOSIS — E876 Hypokalemia: Secondary | ICD-10-CM | POA: Diagnosis not present

## 2023-07-04 HISTORY — DX: Weakness: R53.1

## 2023-07-04 MED ORDER — ALBUTEROL SULFATE HFA 108 (90 BASE) MCG/ACT IN AERS
2.0000 | INHALATION_SPRAY | Freq: Four times a day (QID) | RESPIRATORY_TRACT | 2 refills | Status: AC | PRN
Start: 2023-07-04 — End: ?

## 2023-07-04 MED ORDER — AEROCHAMBER PLUS FLO-VU MISC: 2 refills | Status: DC

## 2023-07-04 MED ORDER — OMEPRAZOLE 20 MG PO CPDR
20.0000 mg | DELAYED_RELEASE_CAPSULE | Freq: Two times a day (BID) | ORAL | 0 refills | Status: AC
Start: 2023-07-04 — End: ?

## 2023-07-04 NOTE — Progress Notes (Signed)
Madelaine Bhat, CMA,acting as a Neurosurgeon for Arnette Felts, FNP.,have documented all relevant documentation on the behalf of Arnette Felts, FNP,as directed by  Arnette Felts, FNP while in the presence of Arnette Felts, FNP.  Subjective:  Patient ID: Judy [name] , female    DOB: 04/12/1946 , 77 y.o.   MRN: [id number]  Chief Complaint  Patient presents with   Breathing Problem    HPI  Patient presents today for shallow breathing, Patient reports compliance with medication. Patient denies any chest pain, SOB, or headaches. Patient reports she has been having a problems for what she reports a month, her sister in law reports it has been a few weeks. Patients sister in law reports she does have phlegm in her throat that she can't get to move. Patient reports she isn't breathing deep enough.  Patient reports she believes she is losing too much weight.   Patients sisters in law reports she believes she need her body to be stronger in order for her to take care of herself, patient does live alone.  Patient present with her sister in law- Joyce  BP Readings from Last 3 Encounters: 07/04/23 : 128/70 06/24/23 : (!) 144/89 05/05/23 : (!) 141/74   Wt Readings from Last 3 Encounters: 07/04/23 : 128 lb 12.8 oz (58.4 kg) 06/24/23 : 124 lb 3.2 oz (56.3 kg) 05/05/23 : 135 lb (61.2 kg)  The pharmacist recommended to take nexium x 2 weeks. Did not help the symptoms. She is still taking the medications. She is drinking 2 bottles of water a day and will drink tea when she eats.  Her sister in law feels she does not take good deep breaths. She will take shallow breaths. She feels like the phlegm is keeping her from breathing well, has been over the last 2 weeks.       Past Medical History:  Diagnosis Date   Allergic rhinitis    Anemia    Anxiety    Arthritis    Bipolar 1 disorder (HCC)    Cancer (HCC)    Depression    Diverticulosis of colon (without mention of hemorrhage) 08/25/2011   Dr. Dulce Sellar    Dyspnea    Hearing loss in left ear    since birth   Hyperlipidemia    Hypertension    resolved   Insomnia    Obesity    Palpitation    Renal disorder    Tardive dyskinesia    Tremor    Vertigo      Family History  Problem Relation Age of Onset   Hypertension Mother    Kidney disease Mother    Stomach cancer Father    Hyperlipidemia Sister    Hypothyroidism Sister    Prostate cancer Brother    Hypertension Brother    Kidney disease Brother    Stroke Brother    Prostate cancer Brother    Breast cancer Neg Hx    Pancreatic cancer Neg Hx    Ovarian cancer Neg Hx    Endometrial cancer Neg Hx      Current Outpatient Medications:    acetaminophen (TYLENOL) 325 MG tablet, Take 2 tablets (650 mg total) by mouth every 6 (six) hours as needed for mild pain (or Fever >/= 101)., Disp: , Rfl:    aspirin EC 81 MG tablet, Take 1 tablet (81 mg total) by mouth daily. Swallow whole., Disp: 90 tablet, Rfl: 3   atorvastatin (LIPITOR) 10 MG tablet, Take 10 mg by mouth  daily., Disp: , Rfl:    b complex vitamins capsule, Take 1 capsule by mouth daily. With C, Disp: , Rfl:    buPROPion (WELLBUTRIN XL) 150 MG 24 hr tablet, Take 1 tablet (150 mg total) by mouth daily., Disp: 90 tablet, Rfl: 0   cholecalciferol (VITAMIN D3) 25 MCG (1000 UNIT) tablet, Take 1,000 Units by mouth daily., Disp: , Rfl:    ipratropium-albuterol (DUONEB) 0.5-2.5 (3) MG/3ML SOLN, 1 treatment in the morning, 1 treatment in the evenings, every 6 hours as needed in between for shortness of breath or wheezing, Disp: 360 mL, Rfl: 3   meclizine (ANTIVERT) 12.5 MG tablet, Take 1 tablet (12.5 mg total) by mouth 3 (three) times daily as needed for dizziness., Disp: 30 tablet, Rfl: 0   metoprolol succinate (TOPROL-XL) 25 MG 24 hr tablet, Take 1 tablet (25 mg total) by mouth daily. Take with or immediately following a meal., Disp: 90 tablet, Rfl: 3   mirtazapine (REMERON) 7.5 MG tablet, TAKE 0.5-1 TABLETS (3.75-7.5 MG TOTAL) BY  MOUTH AT BEDTIME AS NEEDED., Disp: 90 tablet, Rfl: 0   Omega-3 Fatty Acids (FISH OIL BURP-LESS PO), Take 700 mg by mouth daily., Disp: , Rfl:    omeprazole (PRILOSEC) 20 MG capsule, Take 1 capsule (20 mg total) by mouth 2 (two) times daily before a meal., Disp: 14 capsule, Rfl: 0   polyethylene glycol (MIRALAX / GLYCOLAX) 17 g packet, Take 17 g by mouth daily. (Patient taking differently: Take 17 g by mouth daily as needed for moderate constipation or mild constipation.), Disp: 30 each, Rfl: 2   Spacer/Aero-Holding Chambers (AEROCHAMBER PLUS WITH MASK) inhaler, Use with albuterol inhaler as needed, Disp: 1 each, Rfl: 2   albuterol (VENTOLIN HFA) 108 (90 Base) MCG/ACT inhaler, Inhale 2 puffs into the lungs every 6 (six) hours as needed for wheezing or shortness of breath., Disp: 18 each, Rfl: 2   INGREZZA 80 MG capsule, Take 1 capsule (80 mg total) by mouth daily., Disp: 30 capsule, Rfl: 1   propranolol (INDERAL) 10 MG tablet, TAKE 1 TABLET BY MOUTH THREE TIMES A DAY AS NEEDED (Patient not taking: Reported on 06/24/2023), Disp: 270 tablet, Rfl: 2   No Known Allergies   Review of Systems  Constitutional: Negative.   Respiratory: Negative.    Cardiovascular: Negative.   Gastrointestinal: Negative.   Genitourinary: Negative.   Musculoskeletal: Negative.   Skin: Negative.   Neurological: Negative.   Psychiatric/Behavioral: Negative.       Today's Vitals   07/04/23 1209  BP: 128/70  Pulse: 69  Temp: 98.7 F (37.1 C)  TempSrc: Oral  SpO2: 93%  Weight: 128 lb 12.8 oz (58.4 kg)  Height: 5\' 9"  (1.753 m)  PainSc: 0-No pain   Body mass index is 19.02 kg/m.  Wt Readings from Last 3 Encounters:  07/04/23 128 lb 12.8 oz (58.4 kg)  06/24/23 124 lb 3.2 oz (56.3 kg)  05/05/23 135 lb (61.2 kg)     Objective:  Physical Exam Vitals reviewed.  Constitutional:      General: She is not in acute distress.    Appearance: Normal appearance. She is well-developed.  HENT:     Head: Normocephalic  and atraumatic.  Cardiovascular:     Rate and Rhythm: Normal rate and regular rhythm.     Pulses: Normal pulses.     Heart sounds: Normal heart sounds. No murmur heard. Pulmonary:     Effort: Pulmonary effort is normal. No respiratory distress.     Breath sounds:  Normal breath sounds. No wheezing.  Musculoskeletal:        General: No swelling. Normal range of motion.     Comments: She is using a rolling, seated rollator  Skin:    General: Skin is warm and dry.     Capillary Refill: Capillary refill takes less than 2 seconds.  Neurological:     General: No focal deficit present.     Mental Status: She is alert and oriented to person, place, and time.     Cranial Nerves: No cranial nerve deficit.     Motor: No weakness.  Psychiatric:        Mood and Affect: Mood normal.        Behavior: Behavior normal.        Thought Content: Thought content normal.        Judgment: Judgment normal.         Assessment And Plan:  Phlegm in throat -     Omeprazole; Take 1 capsule (20 mg total) by mouth 2 (two) times daily before a meal.  Dispense: 14 capsule; Refill: 0  Dyspnea on exertion Assessment & Plan: She has having intermittent shortness of breath may be related to deconditioning.  She is to continue using her inhalers.  She can also call her pulmonologist if this persists.  Will try to get her home health for physical therapy for endurance and stamina.  I have also given her a spacer to help get the medications in when using inhaler.  Orders: -     Albuterol Sulfate HFA; Inhale 2 puffs into the lungs every 6 (six) hours as needed for wheezing or shortness of breath.  Dispense: 18 each; Refill: 2  Generalized weakness Assessment & Plan: She is having generalized weakness we will check for any metabolic cause.  Will also order physical therapy  Orders: -     Ambulatory referral to Home Health -     Vitamin B12  Decreased appetite Assessment & Plan: To have a decreased appetite her  weight has been slowly increasing since her last visit has gained 4 pounds.  She did have initial weight loss right before her surgery.  Encouraged to drink protein shakes when not hungry.      Need for influenza vaccination Assessment & Plan: Influenza vaccine administered Encouraged to take Tylenol as needed for fever or muscle aches.   Orders: -     Flu Vaccine Trivalent High Dose (Fluad)  Tetanus, diphtheria, and acellular pertussis (Tdap) vaccination declined  COVID-19 vaccination declined Assessment & Plan: Declines covid 19 vaccine. Discussed risk of covid 40 and if she changes her mind about the vaccine to call the office. Education has been provided regarding the importance of this vaccine but patient still declined. Advised may receive this vaccine at local pharmacy or Health Dept.or vaccine clinic. Aware to provide a copy of the vaccination record if obtained from local pharmacy or Health Dept.  Encouraged to take multivitamin, vitamin d, vitamin c and zinc to increase immune system. Aware can call office if would like to have vaccine here at office. Verbalized acceptance and understanding.    Right hemiplegia (HCC)  Other orders -     AeroChamber Plus Flo-Vu; Use with albuterol inhaler as needed  Dispense: 1 each; Refill: 2 -     Basic metabolic panel -     Specimen status report    No follow-ups on file.  Patient was given opportunity to ask questions. Patient verbalized understanding of the  plan and was able to repeat key elements of the plan. All questions were answered to their satisfaction.   Jeanell Sparrow, FNP, have reviewed all documentation for this visit. The documentation on 07/04/23 for the exam, diagnosis, procedures, and orders are all accurate and complete.    IF YOU HAVE BEEN REFERRED TO A SPECIALIST, IT MAY TAKE 1-2 WEEKS TO SCHEDULE/PROCESS THE REFERRAL. IF YOU HAVE NOT HEARD FROM US/SPECIALIST IN TWO WEEKS, PLEASE GIVE [place] A CALL AT [phone] X  [phone].

## 2023-07-04 NOTE — Telephone Encounter (Signed)
Brother said she weaned off about 2 weeks ago, but started back on 80 mg capsules she had left this weekend. Pended RF to [facility] to review.

## 2023-07-04 NOTE — Telephone Encounter (Signed)
PT called to let T. Hurst know that she had to restart Ingrezza. She is taking 80mg . She was having lip and mouth movements.

## 2023-07-05 LAB — VITAMIN B12: Vitamin B-12: 792 pg/mL (ref 232–1245)

## 2023-07-05 MED ORDER — INGREZZA 80 MG PO CAPS: 80.0000 mg | ORAL_CAPSULE | Freq: Every day | ORAL | 1 refills | Status: DC

## 2023-07-05 NOTE — Telephone Encounter (Signed)
Pt's brother called back today at 9:45a asking for status.  I told him we would be back in touch as soon as we got a response from [place].  Next appt 10/8

## 2023-07-05 NOTE — Telephone Encounter (Signed)
Sherilyn Cooter notified that Rx had been sent.

## 2023-07-06 ENCOUNTER — Ambulatory Visit
Admission: RE | Admit: 2023-07-06 | Discharge: 2023-07-06 | Disposition: A | Discharge status: Home / Self Care | Payer: Medicare Other | Source: Ambulatory Visit | Attending: Pulmonary Disease | Admitting: Pulmonary Disease

## 2023-07-06 DIAGNOSIS — R911 Solitary pulmonary nodule: Secondary | ICD-10-CM | POA: Diagnosis not present

## 2023-07-06 DIAGNOSIS — I700 Atherosclerosis of aorta: Secondary | ICD-10-CM | POA: Diagnosis not present

## 2023-07-06 DIAGNOSIS — I3139 Other pericardial effusion (noninflammatory): Secondary | ICD-10-CM | POA: Diagnosis not present

## 2023-07-06 LAB — BASIC METABOLIC PANEL
BUN/Creatinine Ratio: 11 — ABNORMAL LOW (ref 12–28)
BUN: 11 mg/dL (ref 8–27)
CO2: 24 mmol/L (ref 20–29)
Calcium: 10.5 mg/dL — ABNORMAL HIGH (ref 8.7–10.3)
Chloride: 98 mmol/L (ref 96–106)
Creatinine, Ser: 0.96 mg/dL (ref 0.57–1.00)
Glucose: 85 mg/dL (ref 70–99)
Potassium: 4.6 mmol/L (ref 3.5–5.2)
Sodium: 141 mmol/L (ref 134–144)
eGFR: 62 mL/min/{1.73_m2} (ref >59)

## 2023-07-06 LAB — SPECIMEN STATUS REPORT

## 2023-07-13 ENCOUNTER — Other Ambulatory Visit: Payer: Self-pay | Admitting: Physician Assistant

## 2023-07-14 NOTE — Assessment & Plan Note (Addendum)
To have a decreased appetite her weight has been slowly increasing since her last visit has gained 4 pounds.  She did have initial weight loss right before her surgery.  Encouraged to drink protein shakes when not hungry.

## 2023-07-14 NOTE — Assessment & Plan Note (Addendum)
She has having intermittent shortness of breath may be related to deconditioning.  She is to continue using her inhalers.  She can also call her pulmonologist if this persists.  Will try to get her home health for physical therapy for endurance and stamina.  I have also given her a spacer to help get the medications in when using inhaler.

## 2023-07-14 NOTE — Assessment & Plan Note (Signed)
She is having generalized weakness we will check for any metabolic cause.  Will also order physical therapy

## 2023-07-14 NOTE — Assessment & Plan Note (Signed)
Influenza vaccine administered Encouraged to take Tylenol as needed for fever or muscle aches.

## 2023-07-14 NOTE — Telephone Encounter (Signed)
Has appt 10/8

## 2023-07-14 NOTE — Assessment & Plan Note (Signed)

## 2023-07-14 NOTE — Telephone Encounter (Signed)
Rf may or may not be appropriate; LV 06/07/23; NV 07/19/23; LF 0706/24 . I'm not sure if we could go ahead and approve rx for nxt 3 months or not dt pt's upcoming appt.

## 2023-07-19 ENCOUNTER — Ambulatory Visit: Payer: Medicare Other | Admitting: Physician Assistant

## 2023-07-20 ENCOUNTER — Ambulatory Visit: Payer: Medicare Other | Admitting: Pulmonary Disease

## 2023-07-20 ENCOUNTER — Encounter: Payer: Self-pay | Admitting: Pulmonary Disease

## 2023-07-20 VITALS — BP 128/88 | HR 73 | Temp 98.7°F | Ht 69.0 in | Wt 129.0 lb

## 2023-07-20 DIAGNOSIS — R0609 Other forms of dyspnea: Secondary | ICD-10-CM

## 2023-07-20 DIAGNOSIS — R221 Localized swelling, mass and lump, neck: Secondary | ICD-10-CM

## 2023-07-20 DIAGNOSIS — J4540 Moderate persistent asthma, uncomplicated: Secondary | ICD-10-CM

## 2023-07-20 MED ORDER — IPRATROPIUM-ALBUTEROL 0.5-2.5 (3) MG/3ML IN SOLN: 3.0000 mL | Freq: Two times a day (BID) | RESPIRATORY_TRACT | 2 refills | Status: DC

## 2023-07-20 NOTE — Progress Notes (Unsigned)
@Patient  ID: Judy [name], female    DOB: 1946/06/10, 77 y.o.   MRN: [id number]  Chief Complaint  Patient presents with  . Follow-up    She has the sensation of mucus in her throat and has had some increased SOB for the past 2 months.     Referring provider: Arnette Felts, FNP  HPI:   77 y.o. woman whom we are seeing for evaluation of dyspnea on exertion.    History difficult to obtain via patient.  Sounds like has sensation of mucus in her throat swelling.  Never produces mucus but feels like there are some there.  Sounds like inflammation.  Does not bring up sputum.  She says her breathing doing better.  Then they say it is not.  She is not using her DuoNebs very often.  She uses albuterol often but not using it correctly.  Sticking her tongue out while using etc.  We discussed inhaler education today.  Discussed adding nebulized medicines.  Discussed ENT referral for the sensation of mucus.  Given lack of production of mucus I doubt there is actually mucus being produced it is probably just swelling or inflammation and needs to be evaluated.  Present for 2 months.  HPI initial visit: Difficulty a bit difficult to obtain from patient.  Unclear if just due to being hard of hearing or other cognitive issue.  History gathered from patient as well as brother, sister-in-law in the room.  State dyspnea on exertion over the last 5 to 6 months.  No clear inciting event.  Denies any respiratory illness etc.  No time of day when things are better or worse.  No position that make things better or worse.  No seasonal or environmental factors she can do the 5 to make things better or worse.  She was given a prescription for albuterol.  Says it helps sometimes.  Not all the time.  Symptoms seem pretty stable day-to-day.  Largely unchanged over time.  Most recent chest CT, in the midst of the symptoms, 07/2022 reviewed and interpreted as clear lungs bilaterally, stable less than 1 cm groundglass  opacity/nodule in the right upper lobe.  She was seen by pulmonary in 2018.  PFTs difficult to perform but no fixed obstruction.  Felt he related to deconditioning, habitus at the time.  Chest imaging clear at that time.  Remains clear.  Discussed at length with clear chest imaging unlikely to have significant pulmonary abnormality.  Asthma is possible does not show up well on imaging nor pulmonary function test.  Discussed role and rationale for empiric treatment as well as additional investigation which they expressed understanding and agreed to.   Questionaires / Pulmonary Flowsheets:   ACT:      No data to display          MMRC:     No data to display          Epworth:      No data to display          Tests:   FENO:  No results found for: "NITRICOXIDE"  PFT:    Latest Ref Rng & Units 01/28/2023    3:58 PM 04/15/2017    2:00 PM  PFT Results  FVC-Pre L 2.36  1.87   FVC-Predicted Pre % 68  63   FVC-Post L 2.41    FVC-Predicted Post % 69    Pre FEV1/FVC % % 78  81   Post FEV1/FCV % % 80  FEV1-Pre L 1.83  1.52   FEV1-Predicted Pre % 70  65   FEV1-Post L 1.93    DLCO uncorrected ml/min/mmHg 30.91    DLCO UNC% % 137    DLCO corrected ml/min/mmHg 30.91    DLCO COR %Predicted % 137    DLVA Predicted % 155    TLC L 4.64    TLC % Predicted % 80    RV % Predicted % 127    Personally reviewed, not fully interpretable, spirometry suggestive of moderate restriction versus air trapping, unable to do lung volumes once again, DLCO within normal limits, results improved compared to 2018  WALK:     06/09/2017    3:43 PM 04/15/2017    4:47 PM  SIX MIN WALK  Supplimental Oxygen during Test? (L/min) No   Tech Comments: slow to normal pace/leg pain and SOB//lmr normal pace/SOB//lmr    Imaging: Personally reviewed and as per EMR discussion this note CT Super D Chest Wo Contrast  Result Date: 07/20/2023 CLINICAL DATA:  Lung nodule. EXAM: CT CHEST WITHOUT CONTRAST  TECHNIQUE: Multidetector CT imaging of the chest was performed using thin slice collimation for electromagnetic bronchoscopy planning purposes, without intravenous contrast. RADIATION DOSE REDUCTION: This exam was performed according to the departmental dose-optimization program which includes automated exposure control, adjustment of the mA and/or kV according to patient size and/or use of iterative reconstruction technique. COMPARISON:  07/14/2022, 11/15/2019 and 08/04/2018. FINDINGS: Cardiovascular: Atherosclerotic calcification of the aorta and left anterior descending coronary artery. Heart is at the upper limits of normal in size to mildly enlarged. Small pericardial effusion, similar. Mediastinum/Nodes: No pathologically enlarged mediastinal or axillary lymph nodes. Hilar regions are difficult to definitively evaluate without IV contrast. Air in the esophagus can be seen with dysmotility. Lungs/Pleura: Subtle upper and midlung zone predominant ill-defined centrilobular ground-glass. Pulmonary nodules measure 3 mm or less in size, unchanged. Per Fleischner Society guidelines, no follow-up is necessary. No pleural fluid. Airway is unremarkable. Upper Abdomen: Visualized portions of the liver, adrenal glands and right kidney are grossly unremarkable. There may be punctate left renal stones. Visualized portions of the spleen, pancreas, stomach and bowel are grossly unremarkable. No upper abdominal adenopathy. Musculoskeletal: Degenerative changes in the spine. Old T12 compression fracture. IMPRESSION: 1. Vague 7 mm ground-glass nodule in the anterior segment right upper lobe, stable from 08/04/2018. Per Fleischner Society guidelines, no follow-up is necessary. 2. Subtle upper and midlung zone predominant ill-defined centrilobular ground-glass can be seen with hypersensitivity pneumonitis. 3. Possible punctate left renal stones. 4. Chronic small pericardial effusion. 5. Aortic atherosclerosis (ICD10-I70.0). Left  anterior descending coronary artery calcification. Electronically Signed   By: Leanna Battles M.D.   On: 07/20/2023 09:39    Lab Results: Personally reviewed CBC    Component Value Date/Time   WBC 7.4 04/08/2023 0445   RBC 3.94 04/08/2023 0445   HGB 12.2 04/08/2023 0445   HGB 14.6 11/24/2022 1233   HCT 37.8 04/08/2023 0445   HCT 45.5 11/24/2022 1233   PLT 190 04/08/2023 0445   PLT 263 11/24/2022 1233   MCV 95.9 04/08/2023 0445   MCV 94 11/24/2022 1233   MCH 31.0 04/08/2023 0445   MCHC 32.3 04/08/2023 0445   RDW 14.3 04/08/2023 0445   RDW 13.5 11/24/2022 1233   LYMPHSABS 1.9 04/28/2022 0437   LYMPHSABS 1.5 07/10/2018 1220   MONOABS 0.6 04/28/2022 0437   EOSABS 0.2 04/28/2022 0437   EOSABS 0.0 07/10/2018 1220   BASOSABS 0.0 04/28/2022 0437   BASOSABS 0.0  07/10/2018 1220    BMET    Component Value Date/Time   NA 141 07/04/2023 1300   K 4.6 07/04/2023 1300   CL 98 07/04/2023 1300   CO2 24 07/04/2023 1300   GLUCOSE 85 07/04/2023 1300   GLUCOSE 85 04/08/2023 0445   BUN 11 07/04/2023 1300   CREATININE 0.96 07/04/2023 1300   CREATININE 1.16 (H) 03/21/2018 1515   CALCIUM 10.5 (H) 07/04/2023 1300   GFRNONAA >60 04/08/2023 0445   GFRNONAA 48 (L) 03/21/2018 1515   GFRAA >60 11/20/2019 0314   GFRAA 55 (L) 03/21/2018 1515    BNP    Component Value Date/Time   BNP 14.6 03/02/2022 1539   BNP 41.6 11/15/2019 1716    ProBNP    Component Value Date/Time   PROBNP 23.0 04/15/2017 1703    Specialty Problems       Pulmonary Problems   Allergic rhinitis    Qualifier: Diagnosis of  By: Delrae Alfred MD, Juliette Mangle SNOMED Dx Update Oct 2024      URI (upper respiratory infection)   Dyspnea on exertion    - Spirometry 04/15/2017  FEV1 1.78 (76%)  Ratio 78 s curvature p no rx   04/15/2017   Walked RA  2 laps @ 185 ft each stopped due to  Sob/ no desats/ nl pace 06/09/2017   Walked RA  2 laps @ 185 ft each stopped due to  Weak legs > sob/ slow pace, no desat          Solitary pulmonary nodule on lung CT    See ct 04/08/17 x 6 mm RUL/ 3 mm LUL       Phlegm in throat    No Known Allergies  Immunization History  Administered Date(s) Administered  . Fluad Quad(high Dose 65+) 06/28/2022  . Fluad Trivalent(High Dose 65+) 07/04/2023  . Influenza, High Dose Seasonal PF 07/24/2018, 07/02/2019  . Influenza,inj,Quad PF,6+ Mos 12/11/2013, 09/12/2015, 07/20/2016, 08/10/2017  . Influenza-Unspecified 08/04/2021  . PFIZER(Purple Top)SARS-COV-2 Vaccination 12/24/2019, 01/15/2020, 07/14/2020, 10/09/2020  . Pneumococcal Polysaccharide-23 12/11/2013, 11/08/2019  . Zoster Recombinant(Shingrix) 03/02/2022, 08/10/2022    Past Medical History:  Diagnosis Date  . Allergic rhinitis   . Anemia   . Anxiety   . Arthritis   . Bipolar 1 disorder (HCC)   . Cancer (HCC)   . Depression   . Diverticulosis of colon (without mention of hemorrhage) 08/25/2011   Dr. Dulce Sellar  . Dyspnea   . 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 Hyperlipidemia   . Hypertension    resolved  . Insomnia   . Obesity   . Palpitation   . Renal disorder   . Tardive dyskinesia   . Tremor   . Vertigo     Tobacco History: Social History   Tobacco Use  Smoking Status Never  Smokeless Tobacco Never   Counseling given: Not Answered   Continue to not smoke  Outpatient Encounter Medications as of 07/20/2023  Medication Sig  . acetaminophen (TYLENOL) 325 MG tablet Take 2 tablets (650 mg total) by mouth every 6 (six) hours as needed for mild pain (or Fever >/= 101).  [name] albuterol (VENTOLIN HFA) 108 (90 Base) MCG/ACT inhaler Inhale 2 puffs into the lungs every 6 (six) hours as needed for wheezing or shortness of breath.  [name] aspirin EC 81 MG tablet Take 1 tablet (81 mg total) by mouth daily. Swallow whole.  [name] atorvastatin (LIPITOR) 10 MG tablet Take 10 mg by mouth  daily.  . b complex vitamins capsule Take 1 capsule by mouth daily. With C  . buPROPion (WELLBUTRIN XL) 150 MG 24 hr tablet Take  1 tablet (150 mg total) by mouth daily.  . cholecalciferol (VITAMIN D3) 25 MCG (1000 UNIT) tablet Take 1,000 Units by mouth daily.  . INGREZZA 80 MG capsule Take 1 capsule (80 mg total) by mouth daily.  [name] ipratropium-albuterol (DUONEB) 0.5-2.5 (3) MG/3ML SOLN 1 treatment in the morning, 1 treatment in the evenings, every 6 hours as needed in between for shortness of breath or wheezing  . meclizine (ANTIVERT) 12.5 MG tablet Take 1 tablet (12.5 mg total) by mouth 3 (three) times daily as needed for dizziness.  . metoprolol succinate (TOPROL-XL) 25 MG 24 hr tablet Take 1 tablet (25 mg total) by mouth daily. Take with or immediately following a meal.  . mirtazapine (REMERON) 7.5 MG tablet TAKE HALF TO ONE TABLET BY MOUTH AT BEDTIME AS NEEDED  . Omega-3 Fatty Acids (FISH OIL BURP-LESS PO) Take 700 mg by mouth daily.  [name] omeprazole (PRILOSEC) 20 MG capsule Take 1 capsule (20 mg total) by mouth 2 (two) times daily before a meal.  . polyethylene glycol (MIRALAX / GLYCOLAX) 17 g packet Take 17 g by mouth daily. (Patient taking differently: Take 17 g by mouth daily as needed for moderate constipation or mild constipation.)  . propranolol (INDERAL) 10 MG tablet TAKE 1 TABLET BY MOUTH THREE TIMES A DAY AS NEEDED  . [DISCONTINUED] Spacer/Aero-Holding Chambers (AEROCHAMBER PLUS WITH MASK) inhaler Use with albuterol inhaler as needed   No facility-administered encounter medications on file as of 07/20/2023.     Review of Systems  Review of Systems  No chest pain with exertion.  No orthopnea or PND.  Comprehensive review of systems otherwise negative. Physical Exam  BP 128/88 (BP Location: Left Arm, Cuff Size: Normal)   Pulse 73   Temp 98.7 F (37.1 C) (Oral)   Ht 5\' 9"  (1.753 m)   Wt 129 lb (58.5 kg)   SpO2 97%   BMI 19.05 kg/m   Wt Readings from Last 5 Encounters:  07/20/23 129 lb (58.5 kg)  07/04/23 128 lb 12.8 oz (58.4 kg)  06/24/23 124 lb 3.2 oz (56.3 kg)  05/05/23 135 lb (61.2 kg)  04/06/23  146 lb 9.6 oz (66.5 kg)    BMI Readings from Last 5 Encounters:  07/20/23 19.05 kg/m  07/04/23 19.02 kg/m  06/24/23 18.34 kg/m  05/05/23 19.94 kg/m  04/06/23 21.65 kg/m     Physical Exam General: Sitting in chair, no acute distress Eyes: EOMI, no icterus Neck: Supple, no JVP Pulmonary: Clear, no work of breathing Cardiovascular: Warm, no edema Abdomen: Distended, bowel sounds present MSK: No synovitis, no joint effusion Neuro: Normal gait, no weakness, hard of hearing Psych: normal mood, full affect   Assessment & Plan:   Dyspnea on exertion: Present for a few months but review of records indicates seen in 2018 in pulmonary clinic for the same.  PFTs hard to complete.  But felt that time related to habitus, deconditioning.  Her chest imaging historically and recently are clear, unlikely be a pulmonary issue.  PFT suggestive of air trapping versus restriction, DLCO within normal limits so intraparenchymal restriction on unlikely and not seen on prior chest imaging.  Unable to perform lung volumes to differentiate the 2.  Most recent CT scan shows no sign of intrathoracic or parenchymal restriction.  Likely air trapping.  Encouraged to use DuoNebs twice a day,  not really using very often.  Uses albuterol incorrectly, inhaler education today.  New prescription for budesonide neb twice daily to see if it helps given concern for air trapping, possible asthma.  Throat irritation/swelling/mucus sensation: With no mucus produced likely inflammation or swelling.  Had some swallowing issues in the past.  Suspect related.  But seems more noticeable over the last 2 months.  Tried budesonide as above although did not really optimistic this will help much.  Did not respond to PPI.  Referral to ENT for fiberoptic exam direct visualization and further recommendations.  Lung nodule: GGO being surveilled by PCP, first noted fall 2019, stable over the last 4 years.  1 year follow-up 07/2023 stable.   No further surveillance needed.  Return in about 3 months (around 10/20/2023).   Karren Burly, MD 07/20/2023   I spent 41 minutes in the care of the patient including face-to-face visit, coordination of care, review of records.

## 2023-07-20 NOTE — Patient Instructions (Signed)
Neck to see you again  Use the DuoNebs twice a day every day to see if this helps with shortness of breath  I prescribed a new medication budesonide, nebulized twice a day every day to see to help with the shortness of breath in the throat sensation of mucus or swelling  I sent a referral to the ENT doctor to evaluate the sensation of mucus or swelling in the throat  We will place a new order for a mask to use with your nebulizer instead of the "T-piece"  Return to clinic in 3 months or sooner as needed with Dr. Judeth Horn

## 2023-07-20 NOTE — Progress Notes (Unsigned)
Patient seen in the office today and instructed on use of albuterol.  Patient expressed understanding and demonstrated technique.  

## 2023-07-21 MED ORDER — BUDESONIDE 0.5 MG/2ML IN SUSP
0.5000 mg | Freq: Two times a day (BID) | RESPIRATORY_TRACT | 12 refills | Status: DC
Start: 2023-07-21 — End: 2023-11-08

## 2023-07-28 ENCOUNTER — Other Ambulatory Visit: Payer: Medicare Other

## 2023-07-28 ENCOUNTER — Other Ambulatory Visit: Payer: Self-pay | Admitting: Physician Assistant

## 2023-08-08 ENCOUNTER — Telehealth: Payer: Self-pay | Admitting: Pulmonary Disease

## 2023-08-08 DIAGNOSIS — J4540 Moderate persistent asthma, uncomplicated: Secondary | ICD-10-CM

## 2023-08-08 NOTE — Telephone Encounter (Signed)
Please follow up on [employer]

## 2023-08-09 NOTE — Telephone Encounter (Signed)
Please contact patient with updated info

## 2023-08-09 NOTE — Telephone Encounter (Signed)
On 12/24/22 order placed for new [employer]. On 07/20/23 order placed for mask to use with neb meds. I have sent order to [facility] asking them to check on this issue

## 2023-08-10 MED ORDER — IPRATROPIUM-ALBUTEROL 0.5-2.5 (3) MG/3ML IN SOLN: RESPIRATORY_TRACT | 3 refills | Status: DC

## 2023-08-10 NOTE — Telephone Encounter (Signed)
Called pt no answer, prescription printed, awaiting Dr.Hunsucker signature. Lvmm for pt to give the office a call back

## 2023-08-10 NOTE — Telephone Encounter (Signed)
Pt brother called in bc the order is with [facility] and they needs a prescription sent in for the neb solution faxed to [phone]

## 2023-08-10 NOTE — Telephone Encounter (Signed)
Called pt no answer, lvmm for pt to give the office a call back. Updating pt on her neb order

## 2023-08-10 NOTE — Telephone Encounter (Signed)
I received a message from [place] with [employer] the neb mask order had to be sent to [facility] and they don't get in any hurry

## 2023-08-10 NOTE — Telephone Encounter (Signed)
Judy [name] brother is returning phone call. Judy [name] phone number is [phone].

## 2023-08-11 NOTE — Telephone Encounter (Signed)
Called pt, no answer. Lvmm for pt to call the offica back

## 2023-08-12 MED ORDER — IPRATROPIUM-ALBUTEROL 0.5-2.5 (3) MG/3ML IN SOLN: RESPIRATORY_TRACT | 3 refills | Status: DC

## 2023-08-12 NOTE — Telephone Encounter (Signed)
Done. thanks

## 2023-08-12 NOTE — Telephone Encounter (Signed)
Dr. Judeth Horn please sign DME order for neb mask/ budesonide Rx will need to be printed.originally written on 07/21/23 Sent to [facility] and [facility] which can't service patient. Script now has to go to [facility]. Patient and her brother inquiring as to why she hasn't received medication.Explained insurance determines where to send.

## 2023-08-15 NOTE — Telephone Encounter (Signed)
I did receive prescription, will fax over. Nfn will await confirmation before closing encounter.   Confirmation received

## 2023-08-22 ENCOUNTER — Ambulatory Visit: Payer: Medicare Other | Admitting: Pulmonary Disease

## 2023-08-22 ENCOUNTER — Telehealth: Payer: Self-pay | Admitting: Pulmonary Disease

## 2023-08-22 NOTE — Telephone Encounter (Signed)
Per prev encounter Pt order was signed and faxed to [facility] on 11/04. Pt brother calling in bc pt still has no rcvd Neb order

## 2023-08-26 ENCOUNTER — Other Ambulatory Visit: Payer: Self-pay | Admitting: Physician Assistant

## 2023-08-27 NOTE — Telephone Encounter (Signed)
Please call to schedule FU. Canceled last appt.

## 2023-09-01 ENCOUNTER — Other Ambulatory Visit: Payer: Self-pay

## 2023-09-02 MED ORDER — INGREZZA 80 MG PO CAPS: 80.0000 mg | ORAL_CAPSULE | Freq: Every day | ORAL | 1 refills | Status: DC

## 2023-09-07 ENCOUNTER — Telehealth: Payer: Self-pay | Admitting: Pulmonary Disease

## 2023-09-07 NOTE — Telephone Encounter (Signed)
[phone] ask for a pharmacist

## 2023-09-07 NOTE — Telephone Encounter (Signed)
[facility] CALLING BC THEY NEED A UPDATE ON THE QUANTITY OF THE MEDICATION ipratropium-albuterol (DUONEB) 0.5-2.5 (3) MG/3ML SOLN & budesonide (PULMICORT) 0.5 MG/2ML nebulizer solution

## 2023-09-15 NOTE — Telephone Encounter (Signed)
Returned call to [employer] [facility] re nebulizer meds They received referral for duoneb and albuterol nebs but no quantity. It appears order went to [facility] to supply meds. Called patient to see if she had her neb meds. She states yes, attempted to call her brother for verification no ans.

## 2023-09-15 NOTE — Telephone Encounter (Signed)
Jeannie from [employer] [facility] needs quantity for medications. Jeannie phone number is [phone] ask for pharmacist.

## 2023-09-16 NOTE — Telephone Encounter (Signed)
Called pt, but brother answered and said he could make appts, but he's not on [place] for [place].  He said he would relay the message to her.

## 2023-09-20 ENCOUNTER — Telehealth: Payer: Self-pay | Admitting: Family Medicine

## 2023-09-20 DIAGNOSIS — R232 Flushing: Secondary | ICD-10-CM

## 2023-09-20 NOTE — Telephone Encounter (Signed)
I called to speak to Judy [name] and spoke with her brother who states since she had her surgery she is having severe hot flashes and can't get appointment with Dr. Macon Large until 11/09/23 and she/ they want to know if Dr. Macon Large will prescribe medication. I explained I will pass message on the Dr. Macon Large and we will let them know what she decides. Nancy Fetter

## 2023-09-20 NOTE — Telephone Encounter (Signed)
Patient called wanting to make an appointment with Dr.A for severe hot flashes. Appointment has been scheduled for 11/09/23 but because appointment is so far out pt request that Dr.A prescribe some medication for the hot flashes to at least get her through the holidays being that she is in so much discomfort.

## 2023-09-21 MED ORDER — GABAPENTIN 300 MG PO CAPS
300.0000 mg | ORAL_CAPSULE | Freq: Three times a day (TID) | ORAL | 1 refills | Status: DC
Start: 2023-09-21 — End: 2023-12-15

## 2023-09-21 NOTE — Telephone Encounter (Signed)
Received orders from Dr. Macon Large via message for Neurontin 300 mg po TID. I sent RX to pharmacy. I called [facility] and spoke with her brother and notified him that RX was sent to pharmacy, call with any other issues/ questions. Nancy Fetter

## 2023-09-21 NOTE — Addendum Note (Signed)
Addended by: Gerome Apley on: 09/21/2023 09:34 AM   Modules accepted: Orders

## 2023-10-19 ENCOUNTER — Ambulatory Visit: Payer: Medicare Other | Admitting: Pulmonary Disease

## 2023-10-27 ENCOUNTER — Other Ambulatory Visit: Payer: Self-pay

## 2023-10-27 ENCOUNTER — Telehealth: Payer: Self-pay

## 2023-10-27 MED ORDER — INGREZZA 80 MG PO CAPS: 80.0000 mg | ORAL_CAPSULE | Freq: Every day | ORAL | 0 refills | Status: DC

## 2023-10-28 ENCOUNTER — Encounter: Payer: Self-pay | Admitting: Gynecologic Oncology

## 2023-10-28 ENCOUNTER — Inpatient Hospital Stay: Payer: Medicare Other | Attending: Gynecologic Oncology | Admitting: Gynecologic Oncology

## 2023-10-28 VITALS — BP 116/74 | HR 72 | Temp 98.1°F | Resp 20 | Wt 130.8 lb

## 2023-10-28 DIAGNOSIS — Z9079 Acquired absence of other genital organ(s): Secondary | ICD-10-CM | POA: Diagnosis not present

## 2023-10-28 DIAGNOSIS — N951 Menopausal and female climacteric states: Secondary | ICD-10-CM | POA: Diagnosis not present

## 2023-10-28 DIAGNOSIS — C541 Malignant neoplasm of endometrium: Secondary | ICD-10-CM | POA: Diagnosis present

## 2023-10-28 DIAGNOSIS — Z90710 Acquired absence of both cervix and uterus: Secondary | ICD-10-CM | POA: Insufficient documentation

## 2023-10-28 DIAGNOSIS — R232 Flushing: Secondary | ICD-10-CM

## 2023-10-28 DIAGNOSIS — Z90722 Acquired absence of ovaries, bilateral: Secondary | ICD-10-CM | POA: Diagnosis not present

## 2023-10-28 NOTE — Patient Instructions (Signed)
It was good to see you today.  I do not see or feel any evidence of cancer recurrence on your exam.  We will see you for follow-up in 6 months.  As always, if you develop any new and concerning symptoms before your next visit, please call to see me sooner.  Please review the information we printed out about Effexor, which is one of the couple of medications that can help with symptomatic hot flashes.

## 2023-10-28 NOTE — Progress Notes (Signed)
Gynecologic Oncology Return Clinic Visit  10/28/23  Reason for Visit: surveillance  Treatment History: Oncology History Overview Note  01/12/23: Pap - atypical glandular cell, favor neoplastic. While overall cellularity of the pap is low, cells look highly atypical and are suspicious for carcinoma favored to be endometrial. HR HPV negative.    Pelvic ultrasound on 01/21/23 revealed a uterus measuring 7.5 x 5.4 x 7 cm.  Multiple fibroids noted measuring up to 3.8 cm.  Endometrium is thickened measuring 24 mm and irregular.  There is a frond-like contour appreciated adjacent to a small amount of endometrial free fluid and a focal masslike area with blood flow measuring up to 4.2 cm.  Left ovary not visualized.  Right ovary contains a 2.3 cm simple appearing cyst.   CT A/P on 02/07/23 reveals a heterogenous lobulated uterus. No evidence of metastatic disease. Minimal sigmoid diverticulosis without diverticulitis.   The patient had recent visit with pulmonology. 1.4% risk of postop pulmonary complication estimated with duration of surgery is less than 3 hours.   Endometrial cancer (HCC)  04/06/2023 Initial Diagnosis   Endometrial cancer (HCC)   04/06/2023 Surgery   Dilation and curettage, robotic-assisted laparoscopic total hysterectomy with bilateral salpingoophorectomy, SLN biopsy bilaterally, peritoneal biopsy, mini-lap for specimen delivery   Findings: On EUA, 10-12 cm mobile uterus. On D&C moderate amount of tissue c/w tumor appreciated after curetting. On intra-abdominal entry, significant adhesions between bilateral liver surfaces and the anterior abdominal wall. Normal appearing stomach, small and large bowel, omentum. No ascites. No adenopathy. Uterus 10 cm with multiple 2-4 cm fibroids including right lateral LUS fibroid. Normal appearing adnexa. Miliary-appearing disease noted on the peritoneum overlying the left uterosacral ligament (resected). No other peritoneal findings. Mapping  successful to bilateral obturator SLNs.  Frozen section from [facility] c/w endometroid cancer, likely low grade.   04/06/2023 Pathology Results   Stage IA2, grade 1 MI 30% Negative SLNs Focus suspicious for LVI?  MMR: loss of MLH1 and PMS2      Interval History: Patient reports overall doing well.  She denies any vaginal bleeding or discharge.  Reports baseline constipation, unchanged.  Denies any new urinary symptoms.  Still uses a depends, especially with difficulty getting to the bathroom at night.  Having some hot flashes.  Past Medical/Surgical History: Past Medical History:  Diagnosis Date   Allergic rhinitis    Anemia    Anxiety    Arthritis    Bipolar 1 disorder (HCC)    Cancer (HCC)    Depression    Diverticulosis of colon (without mention of hemorrhage) 08/25/2011   Dr. Dulce Sellar   Dyspnea    Hearing loss in left ear    since birth   Hyperlipidemia    Hypertension    resolved   Insomnia    Obesity    Palpitation    Renal disorder    Tardive dyskinesia    Tremor    Vertigo     Past Surgical History:  Procedure Laterality Date   COLONOSCOPY  08/25/2011   Procedure: COLONOSCOPY;  Surgeon: Freddy Jaksch, MD;  Location: [facility];  Service: Endoscopy;  Laterality: N/A;   DILATION AND CURETTAGE OF UTERUS     DILATION AND CURETTAGE OF UTERUS N/A 04/06/2023   Procedure: DILATATION AND CURETTAGE;  Surgeon: Carver Fila, MD;  Location: [facility];  Service: Gynecology;  Laterality: N/A;   LYMPH NODE DISSECTION N/A 04/06/2023   Procedure: MINI LAPAROTOMY;  Surgeon: Carver Fila, MD;  Location: [facility];  Service: Gynecology;  Laterality: N/A;   ROBOTIC ASSISTED TOTAL HYSTERECTOMY WITH BILATERAL SALPINGO OOPHERECTOMY N/A 04/06/2023   Procedure: XI ROBOTIC ASSISTED TOTAL HYSTERECTOMY WITH BILATERAL SALPINGO OOPHORECTOMY;  Surgeon: Carver Fila, MD;  Location: [facility];  Service: Gynecology;  Laterality: N/A;   SENTINEL NODE BIOPSY N/A 04/06/2023    Procedure: SENTINEL NODE BIOPSY;  Surgeon: Carver Fila, MD;  Location: [facility];  Service: Gynecology;  Laterality: N/A;   TOTAL ABDOMINAL HYSTERECTOMY      Family History  Problem Relation Age of Onset   Hypertension Mother    Kidney disease Mother    Stomach cancer Father    Hyperlipidemia Sister    Hypothyroidism Sister    Prostate cancer Brother    Hypertension Brother    Kidney disease Brother    Stroke Brother    Prostate cancer Brother    Breast cancer Neg Hx    Pancreatic cancer Neg Hx    Ovarian cancer Neg Hx    Endometrial cancer Neg Hx     Social History   Socioeconomic History   Marital status: Single    Spouse name: Not on file   Number of children: 1   Years of education: 12   Highest education level: Not on file  Occupational History   Occupation: Retired  Tobacco Use   Smoking status: Never   Smokeless tobacco: Never  Vaping Use   Vaping status: Never Used  Substance and Sexual Activity   Alcohol use: Never   Drug use: Never   Sexual activity: Not Currently  Other Topics Concern   Not on file  Social History Narrative   Lives alone in house, does not need assist device.  Works part time at [employer]. Has a son.     Cousin and sister in close proximity   Right handed   2 cups coffee per day   Social Drivers of Health   Financial Resource Strain: Low Risk  (12/16/2022)   Overall Financial Resource Strain (CARDIA)    Difficulty of Paying Living Expenses: Not hard at all  Food Insecurity: Food Insecurity Present (04/07/2023)   Hunger Vital Sign    Worried About Running Out of Food in the Last Year: Never true    Ran Out of Food in the Last Year: Sometimes true  Transportation Needs: Unmet Transportation Needs (03/03/2023)   PRAPARE - Administrator, Civil Service (Medical): Yes    Lack of Transportation (Non-Medical): Yes  Physical Activity: Insufficiently Active (12/16/2022)   Exercise Vital Sign    Days of  Exercise per Week: 3 days    Minutes of Exercise per Session: 20 min  Stress: Stress Concern Present (12/16/2022)   [employer] of Occupational Health - Occupational Stress Questionnaire    Feeling of Stress : To some extent  Social Connections: Unknown (02/23/2022)   Received from [facility], [facility]   Social Network    Social Network: Not on file    Current Medications:  Current Outpatient Medications:    buPROPion (WELLBUTRIN XL) 150 MG 24 hr tablet, TAKE 1 TABLET BY MOUTH EVERY DAY, Disp: 90 tablet, Rfl: 0   mirtazapine (REMERON) 7.5 MG tablet, TAKE HALF TO ONE TABLET BY MOUTH AT BEDTIME AS NEEDED, Disp: 90 tablet, Rfl: 0   acetaminophen (TYLENOL) 325 MG tablet, Take 2 tablets (650 mg total) by mouth every 6 (six) hours as needed for mild pain (or Fever >/= 101)., Disp: , Rfl:  albuterol (VENTOLIN HFA) 108 (90 Base) MCG/ACT inhaler, Inhale 2 puffs into the lungs every 6 (six) hours as needed for wheezing or shortness of breath., Disp: 18 each, Rfl: 2   aspirin EC 81 MG tablet, Take 1 tablet (81 mg total) by mouth daily. Swallow whole., Disp: 90 tablet, Rfl: 3   atorvastatin (LIPITOR) 10 MG tablet, Take 10 mg by mouth daily., Disp: , Rfl:    b complex vitamins capsule, Take 1 capsule by mouth daily. With C, Disp: , Rfl:    budesonide (PULMICORT) 0.5 MG/2ML nebulizer solution, Take 2 mLs (0.5 mg total) by nebulization 2 (two) times daily., Disp: 120 mL, Rfl: 12   cholecalciferol (VITAMIN D3) 25 MCG (1000 UNIT) tablet, Take 1,000 Units by mouth daily., Disp: , Rfl:    gabapentin (NEURONTIN) 300 MG capsule, Take 1 capsule (300 mg total) by mouth 3 (three) times daily., Disp: 90 capsule, Rfl: 1   INGREZZA 80 MG capsule, Take 1 capsule (80 mg total) by mouth daily., Disp: 30 capsule, Rfl: 0   ipratropium-albuterol (DUONEB) 0.5-2.5 (3) MG/3ML SOLN, 1 treatment in the morning, 1 treatment in the evenings, every 6 hours as needed in between for shortness of breath or wheezing, Disp:  360 mL, Rfl: 3   meclizine (ANTIVERT) 12.5 MG tablet, Take 1 tablet (12.5 mg total) by mouth 3 (three) times daily as needed for dizziness., Disp: 30 tablet, Rfl: 0   metoprolol succinate (TOPROL-XL) 25 MG 24 hr tablet, Take 1 tablet (25 mg total) by mouth daily. Take with or immediately following a meal., Disp: 90 tablet, Rfl: 3   Omega-3 Fatty Acids (FISH OIL BURP-LESS PO), Take 700 mg by mouth daily., Disp: , Rfl:    omeprazole (PRILOSEC) 20 MG capsule, Take 1 capsule (20 mg total) by mouth 2 (two) times daily before a meal., Disp: 14 capsule, Rfl: 0   polyethylene glycol (MIRALAX / GLYCOLAX) 17 g packet, Take 17 g by mouth daily. (Patient taking differently: Take 17 g by mouth daily as needed for moderate constipation or mild constipation.), Disp: 30 each, Rfl: 2   propranolol (INDERAL) 10 MG tablet, TAKE 1 TABLET BY MOUTH THREE TIMES A DAY AS NEEDED, Disp: 270 tablet, Rfl: 2  Review of Systems: + Constipation, hot flashes Denies appetite changes, fevers, chills, fatigue, unexplained weight changes. Denies hearing loss, neck lumps or masses, mouth sores, ringing in ears or voice changes. Denies cough or wheezing.  Denies shortness of breath. Denies chest pain or palpitations. Denies leg swelling. Denies abdominal distention, pain, blood in stools, diarrhea, nausea, vomiting, or early satiety. Denies pain with intercourse, dysuria, frequency, hematuria or incontinence. Denies pelvic pain, vaginal bleeding or vaginal discharge.   Denies joint pain, back pain or muscle pain/cramps. Denies itching, rash, or wounds. Denies dizziness, headaches, numbness or seizures. Denies swollen lymph nodes or glands, denies easy bruising or bleeding. Denies anxiety, depression, confusion, or decreased concentration.  Physical Exam: BP 116/74 (BP Location: Left Arm, Patient Position: Sitting)   Pulse 72   Temp 98.1 F (36.7 C) (Oral)   Resp 20   Wt 130 lb 12.8 oz (59.3 kg)   SpO2 100%   BMI 19.32  kg/m  General: Alert, oriented, no acute distress. HEENT: Posterior oropharynx clear, sclera anicteric. Chest: Clear to auscultation bilaterally.  No wheezes or rhonchi.  Increased respiratory rate with shallow breaths, appears comfortable. Cardiovascular: Regular rate and rhythm, no murmurs. Abdomen: soft, nontender.  Normoactive bowel sounds.  No masses or hepatosplenomegaly appreciated.  Well-healed  incisions. Extremities: Grossly normal range of motion.  Warm, well perfused.  No edema bilaterally. Skin: No rashes or lesions noted. Lymphatics: No cervical, supraclavicular, or inguinal adenopathy. GU: Normal appearing external genitalia without erythema, excoriation, or lesions.  Speculum exam reveals utterly atrophic vaginal mucosa, cuff intact.  Bimanual exam reveals no masses or nodularity.    Laboratory & Radiologic Studies: None new  Assessment & Plan: Judy [name] is a 78 y.o. 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 ? Single focus of LVI. Surgery 03/2023. MSI-H, MLH1 hypermethylation present. P53 WT.   The patient is overall doing well, NED on exam today.  Gwyndolyn Kaufman rate is increased, similar to what it has been at previous visits.  Her daughter confirms that this is very common for her when she goes to doctors appointments.   Discussed hot flashes.  Given her age as well as decreased mobility, I would favor not on hormonal PE.  We discussed options including gabapentin and Effexor.  Patient has been on gabapentin before and per her daughters report, did not tolerate this well.  She was given patient handout about Effexor.  She is on Wellbutrin and Remeron already.  If she was interested in starting Effexor, would probably benefit from a discussion with her primary care provider regarding transition from Wellbutrin to Effexor or versus adding Effexor to her current regimen.   Per NCCN surveillance recommendations, discussed plan  for surveillance visits every 6 months.  We will alternate these between my office and her OB/GYN.  I will see her for her first visit in 12 months.  We reviewed signs and symptoms that should prompt a phone call between visits and would be concerning for cancer recurrence.  22 minutes of total time was spent for this patient encounter, including preparation, face-to-face counseling with the patient and coordination of care, and documentation of the encounter.  Eugene Garnet, MD  Division of Gynecologic Oncology  Department of Obstetrics and Gynecology  [facility]

## 2023-10-31 NOTE — Telephone Encounter (Signed)
 Judy [name]

## 2023-11-02 NOTE — Telephone Encounter (Signed)
Pt has appt 3/6

## 2023-11-08 ENCOUNTER — Encounter: Payer: Self-pay | Admitting: Nurse Practitioner

## 2023-11-08 ENCOUNTER — Ambulatory Visit: Payer: Medicare Other | Admitting: Nurse Practitioner

## 2023-11-08 ENCOUNTER — Other Ambulatory Visit: Payer: Self-pay | Admitting: Pulmonary Disease

## 2023-11-08 VITALS — BP 120/80 | HR 77 | Temp 99.0°F | Ht 69.0 in | Wt 130.8 lb

## 2023-11-08 DIAGNOSIS — I10 Essential (primary) hypertension: Secondary | ICD-10-CM

## 2023-11-08 DIAGNOSIS — R7309 Other abnormal glucose: Secondary | ICD-10-CM

## 2023-11-08 DIAGNOSIS — Z0000 Encounter for general adult medical examination without abnormal findings: Secondary | ICD-10-CM | POA: Insufficient documentation

## 2023-11-08 DIAGNOSIS — R42 Dizziness and giddiness: Secondary | ICD-10-CM

## 2023-11-08 DIAGNOSIS — Z2821 Immunization not carried out because of patient refusal: Secondary | ICD-10-CM

## 2023-11-08 DIAGNOSIS — I700 Atherosclerosis of aorta: Secondary | ICD-10-CM

## 2023-11-08 DIAGNOSIS — F3130 Bipolar disorder, current episode depressed, mild or moderate severity, unspecified: Secondary | ICD-10-CM

## 2023-11-08 DIAGNOSIS — E782 Mixed hyperlipidemia: Secondary | ICD-10-CM | POA: Diagnosis not present

## 2023-11-08 DIAGNOSIS — Z79899 Other long term (current) drug therapy: Secondary | ICD-10-CM

## 2023-11-08 DIAGNOSIS — J4540 Moderate persistent asthma, uncomplicated: Secondary | ICD-10-CM

## 2023-11-08 DIAGNOSIS — E7800 Pure hypercholesterolemia, unspecified: Secondary | ICD-10-CM

## 2023-11-08 LAB — POCT URINALYSIS DIP (CLINITEK)
Bilirubin, UA: NEGATIVE
Blood, UA: NEGATIVE
Glucose, UA: NEGATIVE mg/dL
Ketones, POC UA: NEGATIVE mg/dL
Leukocytes, UA: NEGATIVE
Nitrite, UA: NEGATIVE
POC PROTEIN,UA: NEGATIVE
Spec Grav, UA: 1.015 (ref 1.010–1.025)
Urobilinogen, UA: 0.2 U/dL
pH, UA: 7 (ref 5.0–8.0)

## 2023-11-08 MED ORDER — MECLIZINE HCL 12.5 MG PO TABS: 12.5000 mg | ORAL_TABLET | Freq: Three times a day (TID) | ORAL | 2 refills | Status: AC | PRN

## 2023-11-08 MED ORDER — MECLIZINE HCL 12.5 MG PO TABS: 12.5000 mg | ORAL_TABLET | Freq: Three times a day (TID) | ORAL | Status: DC | PRN

## 2023-11-08 MED ORDER — IPRATROPIUM-ALBUTEROL 0.5-2.5 (3) MG/3ML IN SOLN: RESPIRATORY_TRACT | 0 refills | Status: DC

## 2023-11-08 MED ORDER — BUDESONIDE 0.5 MG/2ML IN SUSP: 0.5000 mg | Freq: Two times a day (BID) | RESPIRATORY_TRACT | 12 refills | Status: DC

## 2023-11-08 NOTE — Telephone Encounter (Signed)
Dr. Judeth Horn placed an order in Nov for nebulizer supplies and meds. It looks like there was an issue with [facility] so order went to [facility]. Per patient's brother they have not received anything from [facility].  Can someone get to the bottom of this.

## 2023-11-08 NOTE — Patient Instructions (Addendum)
Health Maintenance  Topic Date Due   Medicare Annual Wellness Visit  12/16/2023   COVID-19 Vaccine (5 - 2024-25 season) 11/24/2023*   Pneumonia Vaccine (2 of 2 - PCV) 07/03/2024*   DTaP/Tdap/Td vaccine (1 - Tdap) 11/07/2024*   Flu Shot  Completed   DEXA scan (bone density measurement)  Completed   Hepatitis C Screening  Completed   Zoster (Shingles) Vaccine  Completed   HPV Vaccine  Aged Out   Colon Cancer Screening  Discontinued  *Topic was postponed. The date shown is not the original due date.   You can use 1/2 water and 1/2 peroxide to your ears or debrox drops over the counter (or you can get some sweet oil to place in the ear canal.

## 2023-11-08 NOTE — Progress Notes (Signed)
Madelaine Bhat, CMA,acting as a Neurosurgeon for Arnette Felts, FNP.,have documented all relevant documentation on the behalf of Arnette Felts, FNP,as directed by  Arnette Felts, FNP while in the presence of Arnette Felts, FNP.  Subjective:    Patient ID: Judy [name] , female    DOB: 1946/09/08 , 78 y.o.   MRN: [id number]  Chief Complaint  Patient presents with   Annual Exam    HPI  Patient presents today for HM.  Patient reports compliance with medication. Patient denies any chest pain, SOB, or headaches. Patient reports she is having some issues with her vertigo that started about 2/3 days ago. She is taking her medication for her vertigo. Patient has been having hot flashes at night time.  She has seen Dr. Judeth Horn and was to have a new prescription but they did not get the medications.  She is getting meals on wheels but also eating a lot of processed foods.  Continues to go to [employer] - no changes.  Continues to need assistance with showering, cleaning, bathing, light housework, set up feeding. She is receiving help from [facility].   Here today with her sister in law       Past Medical History:  Diagnosis Date   Allergic rhinitis    Anemia    Anxiety    Arthritis    Bipolar 1 disorder (HCC)    Cancer (HCC)    Depression    Diverticulosis of colon (without mention of hemorrhage) 08/25/2011   Dr. Dulce Sellar   Dyspnea    Endometrial cancer [facility]) 04/06/2023   Generalized weakness 07/04/2023   Hearing loss in left ear    since birth   Hyperlipidemia    Hypertension    resolved   Insomnia    Obesity    Palpitation    Renal disorder    Tardive dyskinesia    Tremor    Vertigo      Family History  Problem Relation Age of Onset   Hypertension Mother    Kidney disease Mother    Stomach cancer Father    Hyperlipidemia Sister    Hypothyroidism Sister    Prostate cancer Brother    Hypertension Brother    Kidney disease Brother    Stroke Brother     Prostate cancer Brother    Breast cancer Neg Hx    Pancreatic cancer Neg Hx    Ovarian cancer Neg Hx    Endometrial cancer Neg Hx      Current Outpatient Medications:    acetaminophen (TYLENOL) 325 MG tablet, Take 2 tablets (650 mg total) by mouth every 6 (six) hours as needed for mild pain (or Fever >/= 101)., Disp: , Rfl:    albuterol (VENTOLIN HFA) 108 (90 Base) MCG/ACT inhaler, Inhale 2 puffs into the lungs every 6 (six) hours as needed for wheezing or shortness of breath., Disp: 18 each, Rfl: 2   aspirin EC 81 MG tablet, Take 1 tablet (81 mg total) by mouth daily. Swallow whole., Disp: 90 tablet, Rfl: 3   atorvastatin (LIPITOR) 10 MG tablet, Take 10 mg by mouth daily., Disp: , Rfl:    b complex vitamins capsule, Take 1 capsule by mouth daily. With C, Disp: , Rfl:    buPROPion (WELLBUTRIN XL) 150 MG 24 hr tablet, TAKE 1 TABLET BY MOUTH EVERY DAY, Disp: 90 tablet, Rfl: 0   cholecalciferol (VITAMIN D3) 25 MCG (1000 UNIT) tablet, Take 1,000 Units by mouth daily., Disp: , Rfl:  gabapentin (NEURONTIN) 300 MG capsule, Take 1 capsule (300 mg total) by mouth 3 (three) times daily., Disp: 90 capsule, Rfl: 1   INGREZZA 80 MG capsule, Take 1 capsule (80 mg total) by mouth daily., Disp: 30 capsule, Rfl: 0   mirtazapine (REMERON) 7.5 MG tablet, TAKE HALF TO ONE TABLET BY MOUTH AT BEDTIME AS NEEDED, Disp: 90 tablet, Rfl: 0   Omega-3 Fatty Acids (FISH OIL BURP-LESS PO), Take 700 mg by mouth daily., Disp: , Rfl:    omeprazole (PRILOSEC) 20 MG capsule, Take 1 capsule (20 mg total) by mouth 2 (two) times daily before a meal., Disp: 14 capsule, Rfl: 0   polyethylene glycol (MIRALAX / GLYCOLAX) 17 g packet, Take 17 g by mouth daily. (Patient taking differently: Take 17 g by mouth daily as needed for moderate constipation or mild constipation.), Disp: 30 each, Rfl: 2   propranolol (INDERAL) 10 MG tablet, TAKE 1 TABLET BY MOUTH THREE TIMES A DAY AS NEEDED, Disp: 270 tablet, Rfl: 2   budesonide (PULMICORT) 0.5  MG/2ML nebulizer solution, Take 2 mLs (0.5 mg total) by nebulization 2 (two) times daily. DX:J45.40, Disp: 120 mL, Rfl: 12   ipratropium-albuterol (DUONEB) 0.5-2.5 (3) MG/3ML SOLN, 1 treatment in the morning, 1 treatment in the evenings, every 6 hours as needed in between for shortness of breath or wheezing DX: J45.40, Disp: 360 mL, Rfl: 0   meclizine (ANTIVERT) 12.5 MG tablet, Take 1 tablet (12.5 mg total) by mouth 3 (three) times daily as needed for dizziness., Disp: 30 tablet, Rfl: 2   metoprolol succinate (TOPROL-XL) 25 MG 24 hr tablet, Take 1 tablet (25 mg total) by mouth daily. Take with or immediately following a meal. (Patient not taking: Reported on 11/08/2023), Disp: 90 tablet, Rfl: 3   No Known Allergies    The patient states she uses post menopausal status for birth control. No LMP recorded. Patient is postmenopausal.. Negative for Dysmenorrhea and Negative for Menorrhagia. Negative for: breast discharge, breast lump(s), breast pain and breast self exam. Associated symptoms include abnormal vaginal bleeding. Pertinent negatives include abnormal bleeding (hematology), anxiety, decreased libido, depression, difficulty falling sleep, dyspareunia, history of infertility, nocturia, sexual dysfunction, sleep disturbances, urinary incontinence, urinary urgency, vaginal discharge and vaginal itching. Diet regular.The patient states her exercise level is minimal with 2 days a week.   The patient's tobacco use is:  Social History   Tobacco Use  Smoking Status Never  Smokeless Tobacco Never  . She has been exposed to passive smoke. The patient's alcohol use is:  Social History   Substance and Sexual Activity  Alcohol Use Never      Review of Systems  Constitutional: Negative.   HENT: Negative.    Eyes: Negative.   Respiratory: Negative.    Cardiovascular: Negative.  Negative for chest pain and palpitations.  Gastrointestinal: Negative.   Endocrine: Negative.   Genitourinary: Negative.    Musculoskeletal: Negative.   Skin: Negative.   Allergic/Immunologic: Negative.   Neurological: Negative.  Negative for headaches.  Hematological: Negative.   Psychiatric/Behavioral: Negative.       Today's Vitals   11/08/23 1117  BP: 120/80  Pulse: 77  Temp: 99 F (37.2 C)  TempSrc: Oral  Weight: 130 lb 12.8 oz (59.3 kg)  Height: 5\' 9"  (1.753 m)  PainSc: 0-No pain   Body mass index is 19.32 kg/m.  Wt Readings from Last 3 Encounters:  11/08/23 130 lb 12.8 oz (59.3 kg)  10/28/23 130 lb 12.8 oz (59.3 kg)  07/20/23 129  lb (58.5 kg)     Objective:  Physical Exam Vitals reviewed.  Constitutional:      General: She is not in acute distress.    Appearance: Normal appearance. She is well-developed.  HENT:     Head: Normocephalic and atraumatic.     Right Ear: Hearing, tympanic membrane, ear canal and external ear normal. There is no impacted cerumen.     Left Ear: Hearing, tympanic membrane, ear canal and external ear normal. There is no impacted cerumen.     Nose: Nose normal.     Mouth/Throat:     Mouth: Mucous membranes are moist.  Eyes:     General: Lids are normal.     Extraocular Movements: Extraocular movements intact.     Conjunctiva/sclera: Conjunctivae normal.     Pupils: Pupils are equal, round, and reactive to light.     Funduscopic exam:    Right eye: No papilledema.        Left eye: No papilledema.  Neck:     Thyroid: No thyroid mass.     Vascular: No carotid bruit.  Cardiovascular:     Rate and Rhythm: Normal rate and regular rhythm.     Pulses: Normal pulses.     Heart sounds: Normal heart sounds. No murmur heard. Pulmonary:     Effort: Pulmonary effort is normal. No respiratory distress.     Breath sounds: Normal breath sounds. No wheezing.  Chest:     Chest wall: No mass.  Breasts:    Tanner Score is 5.     Right: Normal. No mass or tenderness.     Left: Normal. No mass or tenderness.  Abdominal:     General: Abdomen is flat. Bowel sounds  are normal. There is no distension.     Palpations: Abdomen is soft.     Tenderness: There is no abdominal tenderness.  Genitourinary:    Rectum: Guaiac result negative.  Musculoskeletal:        General: No swelling or tenderness. Normal range of motion.     Cervical back: Full passive range of motion without pain, normal range of motion and neck supple.     Right lower leg: No edema.     Left lower leg: No edema.  Lymphadenopathy:     Upper Body:     Right upper body: No supraclavicular, axillary or pectoral adenopathy.     Left upper body: No supraclavicular, axillary or pectoral adenopathy.  Skin:    General: Skin is warm and dry.     Capillary Refill: Capillary refill takes less than 2 seconds.  Neurological:     General: No focal deficit present.     Mental Status: She is alert and oriented to person, place, and time.     Cranial Nerves: No cranial nerve deficit.     Sensory: No sensory deficit.     Motor: No weakness.     Comments: She has mild uncontrolled tremors  Psychiatric:        Mood and Affect: Mood normal.        Behavior: Behavior normal.        Thought Content: Thought content normal.        Judgment: Judgment normal.         Assessment And Plan:     Encounter for annual health examination Assessment & Plan: Behavior modifications discussed and diet history reviewed.   Pt will continue to exercise regularly and modify diet with low GI, plant based foods and  decrease intake of processed foods.  Recommend intake of daily multivitamin, Vitamin D, and calcium.  Recommend mammogram and colonoscopy for preventive screenings, as well as recommend immunizations that include influenza, TDAP, and Shingles    Essential hypertension Assessment & Plan: Blood pressure is controlled, continue current medications. EKG done with NSR HR 62  Orders: -     EKG 12-Lead -     POCT URINALYSIS DIP (CLINITEK) -     Microalbumin / creatinine urine ratio -     CMP14+EGFR -      Lipid panel  Abnormal glucose Assessment & Plan: Will check A1c. No current medications.   Orders: -     Hemoglobin A1c  Mixed hyperlipidemia Assessment & Plan: Will check lipid panel   Vertigo Assessment & Plan: Intermittent episodes of vertigo will refill her meclizine  Orders: -     Meclizine HCl; Take 1 tablet (12.5 mg total) by mouth 3 (three) times daily as needed for dizziness.  Dispense: 30 tablet; Refill: 2  Tetanus, diphtheria, and acellular pertussis (Tdap) vaccination declined  COVID-19 vaccination declined Assessment & Plan: Declines covid 19 vaccine. Discussed risk of covid 46 and if she changes her mind about the vaccine to call the office. Education has been provided regarding the importance of this vaccine but patient still declined. Advised may receive this vaccine at local pharmacy or Health Dept.or vaccine clinic. Aware to provide a copy of the vaccination record if obtained from local pharmacy or Health Dept.  Encouraged to take multivitamin, vitamin d, vitamin c and zinc to increase immune system. Aware can call office if would like to have vaccine here at office. Verbalized acceptance and understanding.    Aortic atherosclerosis (HCC) Assessment & Plan: Continue statin, goal LDL < 70.    Other long term (current) drug therapy -     CBC with Differential/Platelet -     TSH  Bipolar I disorder, most recent episode depressed (HCC) Assessment & Plan: Continue f/u with Behavioral Health      Return for 1 year physical, 6 month bp check. Patient was given opportunity to ask questions. Patient verbalized understanding of the plan and was able to repeat key elements of the plan. All questions were answered to their satisfaction.   Arnette Felts, FNP  I, Arnette Felts, FNP, have reviewed all documentation for this visit. The documentation on 11/08/23 for the exam, diagnosis, procedures, and orders are all accurate and complete.

## 2023-11-09 ENCOUNTER — Ambulatory Visit: Payer: Medicare Other | Admitting: Obstetrics & Gynecology

## 2023-11-09 ENCOUNTER — Encounter: Payer: Self-pay | Admitting: Obstetrics & Gynecology

## 2023-11-09 LAB — MICROALBUMIN / CREATININE URINE RATIO
Creatinine, Urine: 97.5 mg/dL
Microalb/Creat Ratio: 12 mg/g{creat} (ref 0–29)
Microalbumin, Urine: 11.6 ug/mL

## 2023-11-09 LAB — CBC WITH DIFFERENTIAL/PLATELET
Basophils Absolute: 0 10*3/uL (ref 0.0–0.2)
Basos: 0 %
EOS (ABSOLUTE): 0 10*3/uL (ref 0.0–0.4)
Eos: 1 %
Hematocrit: 43.5 % (ref 34.0–46.6)
Hemoglobin: 14.4 g/dL (ref 11.1–15.9)
Immature Grans (Abs): 0 10*3/uL (ref 0.0–0.1)
Immature Granulocytes: 0 %
Lymphocytes Absolute: 1.6 10*3/uL (ref 0.7–3.1)
Lymphs: 32 %
MCH: 31.4 pg (ref 26.6–33.0)
MCHC: 33.1 g/dL (ref 31.5–35.7)
MCV: 95 fL (ref 79–97)
Monocytes Absolute: 0.4 10*3/uL (ref 0.1–0.9)
Monocytes: 7 %
Neutrophils Absolute: 3 10*3/uL (ref 1.4–7.0)
Neutrophils: 60 %
Platelets: 235 10*3/uL (ref 150–450)
RBC: 4.59 x10E6/uL (ref 3.77–5.28)
RDW: 12.8 % (ref 11.7–15.4)
WBC: 5 10*3/uL (ref 3.4–10.8)

## 2023-11-09 LAB — CMP14+EGFR
ALT: 112 [IU]/L — ABNORMAL HIGH (ref 0–32)
AST: 79 [IU]/L — ABNORMAL HIGH (ref 0–40)
Albumin: 4.1 g/dL (ref 3.8–4.8)
Alkaline Phosphatase: 72 [IU]/L (ref 44–121)
BUN/Creatinine Ratio: 14 (ref 12–28)
BUN: 13 mg/dL (ref 8–27)
Bilirubin Total: 0.3 mg/dL (ref 0.0–1.2)
CO2: 27 mmol/L (ref 20–29)
Calcium: 9.9 mg/dL (ref 8.7–10.3)
Chloride: 105 mmol/L (ref 96–106)
Creatinine, Ser: 0.94 mg/dL (ref 0.57–1.00)
Globulin, Total: 2.7 g/dL (ref 1.5–4.5)
Glucose: 83 mg/dL (ref 70–99)
Potassium: 4 mmol/L (ref 3.5–5.2)
Sodium: 144 mmol/L (ref 134–144)
Total Protein: 6.8 g/dL (ref 6.0–8.5)
eGFR: 63 mL/min/{1.73_m2} (ref >59)

## 2023-11-09 LAB — LIPID PANEL
Chol/HDL Ratio: 2.4 {ratio} (ref 0.0–4.4)
Cholesterol, Total: 171 mg/dL (ref 100–199)
HDL: 72 mg/dL (ref >39)
LDL Chol Calc (NIH): 83 mg/dL (ref 0–99)
Triglycerides: 86 mg/dL (ref 0–149)
VLDL Cholesterol Cal: 16 mg/dL (ref 5–40)

## 2023-11-09 LAB — TSH: TSH: 1.05 u[IU]/mL (ref 0.450–4.500)

## 2023-11-09 LAB — HEMOGLOBIN A1C
Est. average glucose Bld gHb Est-mCnc: 111 mg/dL
Hgb A1c MFr Bld: 5.5 % (ref 4.8–5.6)

## 2023-11-13 DIAGNOSIS — F3130 Bipolar disorder, current episode depressed, mild or moderate severity, unspecified: Secondary | ICD-10-CM | POA: Insufficient documentation

## 2023-11-13 NOTE — Assessment & Plan Note (Signed)

## 2023-11-13 NOTE — Assessment & Plan Note (Signed)

## 2023-11-13 NOTE — Assessment & Plan Note (Signed)
 Continue f/u with Behavioral Health

## 2023-11-13 NOTE — Assessment & Plan Note (Signed)
Intermittent episodes of vertigo will refill her meclizine

## 2023-11-13 NOTE — Assessment & Plan Note (Signed)
 Will check lipid panel

## 2023-11-13 NOTE — Assessment & Plan Note (Signed)
Will check A1c. No current medications.

## 2023-11-13 NOTE — Assessment & Plan Note (Addendum)
 Blood pressure is controlled, continue current medications. EKG done with NSR HR 62

## 2023-11-13 NOTE — Assessment & Plan Note (Signed)
Continue statin, goal LDL<70

## 2023-11-20 ENCOUNTER — Other Ambulatory Visit: Payer: Self-pay | Admitting: Physician Assistant

## 2023-11-21 ENCOUNTER — Other Ambulatory Visit: Payer: Self-pay

## 2023-11-21 MED ORDER — INGREZZA 80 MG PO CAPS: 80.0000 mg | ORAL_CAPSULE | Freq: Every day | ORAL | 0 refills | Status: DC

## 2023-12-15 ENCOUNTER — Ambulatory Visit: Payer: Medicare Other | Admitting: Physician Assistant

## 2023-12-15 DIAGNOSIS — F411 Generalized anxiety disorder: Secondary | ICD-10-CM

## 2023-12-15 DIAGNOSIS — G2401 Drug induced subacute dyskinesia: Secondary | ICD-10-CM

## 2023-12-15 DIAGNOSIS — F39 Unspecified mood [affective] disorder: Secondary | ICD-10-CM | POA: Diagnosis not present

## 2023-12-15 MED ORDER — BUPROPION HCL ER (XL) 150 MG PO TB24: 150.0000 mg | ORAL_TABLET | Freq: Every day | ORAL | 1 refills | Status: DC

## 2023-12-15 MED ORDER — INGREZZA 80 MG PO CAPS: 80.0000 mg | ORAL_CAPSULE | Freq: Every day | ORAL | 5 refills | Status: DC

## 2023-12-15 NOTE — Progress Notes (Signed)
 [facility]  Patient ID: Judy [name],  MRN: [id number]  PCP: Arnette Felts, FNP  Date of Evaluation: 12/15/2023 Time spent:20 minutes  Chief Complaint:  Chief Complaint   Anxiety; Depression; Follow-up    HISTORY/CURRENT STATUS: HPI For routine med check. Sister-in-law Alona Bene is with her.   Patient states she's doing well.  Energy and motivation are fair to good depending on the day.   No extreme sadness, tearfulness, or feelings of hopelessness.  Sleeps well most of the time. ADLs and personal hygiene are normal.   Denies any changes in concentration, making decisions, or remembering things.  Not eating great, more so from what is easily available so she doesn't have to cook. Gets Meals on Wheels.  No c/o anxiety. Denies suicidal or homicidal thoughts.  Patient denies increased energy with decreased need for sleep, increased talkativeness, racing thoughts, impulsivity or risky behaviors, increased spending, increased libido, grandiosity, increased irritability or anger, paranoia, or hallucinations.  No abnormal mouth movements since being on Ingrezza.  When she's tried to go off it in the past, she movements recur.  Denies dizziness, syncope, seizures, numbness, tingling, tremor, tics, unsteady gait, slurred speech, confusion. Denies muscle or joint pain, stiffness, or dystonia. Denies unexplained weight loss, frequent infections, or sores that heal slowly.  No polyphagia, polydipsia, or polyuria. Denies visual changes or paresthesias.   Individual Medical History/ Review of Systems: Changes? :No       Past medications for mental health diagnoses include: Latuda, Seroquel, Prozac, Fetzima, Risperdal, gabapentin, Wellbutrin  Allergies: Patient has no known allergies.  Current Medications:  Current Outpatient Medications:    acetaminophen (TYLENOL) 325 MG tablet, Take 2 tablets (650 mg total) by mouth every 6 (six) hours as needed for mild pain (or Fever >/= 101)., Disp:  , Rfl:    albuterol (VENTOLIN HFA) 108 (90 Base) MCG/ACT inhaler, Inhale 2 puffs into the lungs every 6 (six) hours as needed for wheezing or shortness of breath., Disp: 18 each, Rfl: 2   aspirin EC 81 MG tablet, Take 1 tablet (81 mg total) by mouth daily. Swallow whole., Disp: 90 tablet, Rfl: 3   b complex vitamins capsule, Take 1 capsule by mouth daily. With C, Disp: , Rfl:    budesonide (PULMICORT) 0.5 MG/2ML nebulizer solution, Take 2 mLs (0.5 mg total) by nebulization 2 (two) times daily. DX:J45.40, Disp: 120 mL, Rfl: 12   cholecalciferol (VITAMIN D3) 25 MCG (1000 UNIT) tablet, Take 1,000 Units by mouth daily., Disp: , Rfl:    ipratropium-albuterol (DUONEB) 0.5-2.5 (3) MG/3ML SOLN, 1 treatment in the morning, 1 treatment in the evenings, every 6 hours as needed in between for shortness of breath or wheezing DX: J45.40, Disp: 360 mL, Rfl: 0   meclizine (ANTIVERT) 12.5 MG tablet, Take 1 tablet (12.5 mg total) by mouth 3 (three) times daily as needed for dizziness., Disp: 30 tablet, Rfl: 2   Omega-3 Fatty Acids (FISH OIL BURP-LESS PO), Take 700 mg by mouth daily., Disp: , Rfl:    atorvastatin (LIPITOR) 10 MG tablet, Take 10 mg by mouth daily., Disp: , Rfl:    buPROPion (WELLBUTRIN XL) 150 MG 24 hr tablet, Take 1 tablet (150 mg total) by mouth daily., Disp: 90 tablet, Rfl: 1   INGREZZA 80 MG capsule, Take 1 capsule (80 mg total) by mouth daily., Disp: 30 capsule, Rfl: 5   metoprolol succinate (TOPROL-XL) 25 MG 24 hr tablet, Take 1 tablet (25 mg total) by mouth daily. Take with or immediately following  a meal. (Patient not taking: Reported on 11/08/2023), Disp: 90 tablet, Rfl: 3   mirtazapine (REMERON) 7.5 MG tablet, TAKE HALF TO ONE TABLET BY MOUTH AT BEDTIME AS NEEDED (Patient not taking: Reported on 12/15/2023), Disp: 90 tablet, Rfl: 0   omeprazole (PRILOSEC) 20 MG capsule, Take 1 capsule (20 mg total) by mouth 2 (two) times daily before a meal. (Patient not taking: Reported on 12/15/2023), Disp: 14  capsule, Rfl: 0   polyethylene glycol (MIRALAX / GLYCOLAX) 17 g packet, Take 17 g by mouth daily. (Patient not taking: Reported on 12/15/2023), Disp: 30 each, Rfl: 2   propranolol (INDERAL) 10 MG tablet, TAKE 1 TABLET BY MOUTH THREE TIMES A DAY AS NEEDED (Patient not taking: Reported on 12/15/2023), Disp: 270 tablet, Rfl: 2 Medication Side Effects:  see HPI  Family Medical/ Social History: Changes? no  MENTAL HEALTH EXAM:  There were no vitals taken for this visit.There is no height or weight on file to calculate BMI.  General Appearance: Casual, Neat and Well Groomed  Eye Contact:  Good  Speech:  Clear and Coherent and Normal Rate  Volume:  Normal  Mood:  Euthymic  Affect:  Appropriate  Thought Process:  Goal Directed and Descriptions of Associations: Circumstantial  Orientation:  Full (Time, Place, and Person)  Thought Content: Logical   Suicidal Thoughts:  No  Homicidal Thoughts:  No  Memory:   fair  Judgement:  Fair  Insight:  Fair  Psychomotor Activity:   walks slowly with a 4 pronged cane. No abnormal mouth, extremity or truncal movements  Concentration:  Concentration: Good  Recall:  Fair  Fund of Knowledge: Good  Language: Good  Assets:  Desire for Improvement  ADL's:  Impaired  Cognition: WNL  Prognosis:  Fair   DIAGNOSES:    ICD-10-CM   1. Episodic mood disorder (HCC)  F39     2. Tardive dyskinesia  G24.01     3. Generalized anxiety disorder  F41.1       Receiving Psychotherapy: No   RECOMMENDATIONS:  PDMP was reviewed.  Gabapentin filled 06/09/2022. I provided 20 minutes of face to face time during this encounter, including time spent before and after the visit in records review, medical decision making, counseling pertinent to today's visit, and charting.   When she's tried to go off Ingrezza, the abnl mouth movements recur, so will continue it.  Continue Wellbutrin XL 150 mg, 1 q am.  Continue Ingrezza 80 mg daily. Continue mirtazapine 7.5 mg, 1/2-1 p.o.  nightly as needed sleep. Continue multivitamin, B complex, vitamin D, fish oil. Return in 6 months.   Melony Overly, PA-C

## 2023-12-18 ENCOUNTER — Encounter: Payer: Self-pay | Admitting: Physician Assistant

## 2023-12-28 ENCOUNTER — Ambulatory Visit (INDEPENDENT_AMBULATORY_CARE_PROVIDER_SITE_OTHER): Payer: Medicare Other

## 2023-12-28 VITALS — BP 128/80 | HR 77 | Temp 98.5°F | Ht 69.0 in | Wt 130.6 lb

## 2023-12-28 DIAGNOSIS — Z0000 Encounter for general adult medical examination without abnormal findings: Secondary | ICD-10-CM | POA: Diagnosis not present

## 2023-12-28 NOTE — Progress Notes (Signed)
 Subjective:   Judy [name] is a 78 y.o. who presents for a Medicare Wellness preventive visit.  Visit Complete: In person    Persons Participating in Visit: Patient assisted by sister and law.  AWV Questionnaire: No: Patient Medicare AWV questionnaire was not completed prior to this visit.  Cardiac Risk Factors include: advanced age (>32men, >59 women);dyslipidemia;hypertension     Objective:    Today's Vitals   12/28/23 1434 12/28/23 1501  BP: (!) 140/80 128/80  Pulse: 77   Temp: 98.5 F (36.9 C)   TempSrc: Oral   SpO2: 97%   Weight: 130 lb 9.6 oz (59.2 kg)   Height: 5\' 9"  (1.753 m)    Body mass index is 19.29 kg/m.     12/28/2023    2:52 PM 04/06/2023   12:36 PM 03/24/2023   10:20 AM 03/03/2023   11:01 AM 12/16/2022    2:48 PM 12/03/2021    4:03 PM 11/20/2020    2:04 PM  Advanced Directives  Does Patient Have a Medical Advance Directive? Yes No No No Yes Yes Yes  Type of Estate agent of [place];Living will    Healthcare Power of [place];Living will Healthcare Power of [place];Living will Healthcare Power of [place];Out of facility DNR (pink MOST or yellow form)  Copy of Healthcare Power of Attorney in Chart? Yes - validated most recent copy scanned in chart (See row information)    Yes - validated most recent copy scanned in chart (See row information) Yes - validated most recent copy scanned in chart (See row information) Yes - validated most recent copy scanned in chart (See row information)  Would patient like information on creating a medical advance directive?  No - Patient declined         Current Medications (verified) Outpatient Encounter Medications as of 12/28/2023  Medication Sig   acetaminophen (TYLENOL) 325 MG tablet Take 2 tablets (650 mg total) by mouth every 6 (six) hours as needed for mild pain (or Fever >/= 101).   albuterol (VENTOLIN HFA) 108 (90 Base) MCG/ACT inhaler Inhale 2 puffs into the lungs every 6 (six) hours as  needed for wheezing or shortness of breath.   aspirin EC 81 MG tablet Take 1 tablet (81 mg total) by mouth daily. Swallow whole.   b complex vitamins capsule Take 1 capsule by mouth daily. With C   budesonide (PULMICORT) 0.5 MG/2ML nebulizer solution Take 2 mLs (0.5 mg total) by nebulization 2 (two) times daily. DX:J45.40   buPROPion (WELLBUTRIN XL) 150 MG 24 hr tablet Take 1 tablet (150 mg total) by mouth daily.   cholecalciferol (VITAMIN D3) 25 MCG (1000 UNIT) tablet Take 1,000 Units by mouth daily.   INGREZZA 80 MG capsule Take 1 capsule (80 mg total) by mouth daily.   ipratropium-albuterol (DUONEB) 0.5-2.5 (3) MG/3ML SOLN 1 treatment in the morning, 1 treatment in the evenings, every 6 hours as needed in between for shortness of breath or wheezing DX: J45.40   meclizine (ANTIVERT) 12.5 MG tablet Take 1 tablet (12.5 mg total) by mouth 3 (three) times daily as needed for dizziness.   mirtazapine (REMERON) 7.5 MG tablet TAKE HALF TO ONE TABLET BY MOUTH AT BEDTIME AS NEEDED   Omega-3 Fatty Acids (FISH OIL BURP-LESS PO) Take 700 mg by mouth daily.   polyethylene glycol (MIRALAX / GLYCOLAX) 17 g packet Take 17 g by mouth daily.   atorvastatin (LIPITOR) 10 MG tablet Take 10 mg by mouth daily. (Patient not taking: Reported  on 12/28/2023)   metoprolol succinate (TOPROL-XL) 25 MG 24 hr tablet Take 1 tablet (25 mg total) by mouth daily. Take with or immediately following a meal. (Patient not taking: Reported on 11/08/2023)   omeprazole (PRILOSEC) 20 MG capsule Take 1 capsule (20 mg total) by mouth 2 (two) times daily before a meal. (Patient not taking: Reported on 12/28/2023)   propranolol (INDERAL) 10 MG tablet TAKE 1 TABLET BY MOUTH THREE TIMES A DAY AS NEEDED (Patient not taking: Reported on 12/15/2023)   No facility-administered encounter medications on file as of 12/28/2023.    Allergies (verified) Patient has no known allergies.   History: Past Medical History:  Diagnosis Date   Allergic rhinitis     Anemia    Anxiety    Arthritis    Bipolar 1 disorder (HCC)    Cancer (HCC)    Depression    Diverticulosis of colon (without mention of hemorrhage) 08/25/2011   Dr. Dulce Sellar   Dyspnea    Endometrial cancer (HCC) 04/06/2023   Generalized weakness 07/04/2023   Hearing loss in left ear    since birth   Hyperlipidemia    Hypertension    resolved   Insomnia    Obesity    Palpitation    Renal disorder    Tardive dyskinesia    Tremor    Vertigo    Past Surgical History:  Procedure Laterality Date   COLONOSCOPY  08/25/2011   Procedure: COLONOSCOPY;  Surgeon: Freddy Jaksch, MD;  Location: [facility];  Service: Endoscopy;  Laterality: N/A;   DILATION AND CURETTAGE OF UTERUS     DILATION AND CURETTAGE OF UTERUS N/A 04/06/2023   Procedure: DILATATION AND CURETTAGE;  Surgeon: Carver Fila, MD;  Location: [facility];  Service: Gynecology;  Laterality: N/A;   LYMPH NODE DISSECTION N/A 04/06/2023   Procedure: MINI LAPAROTOMY;  Surgeon: Carver Fila, MD;  Location: [facility];  Service: Gynecology;  Laterality: N/A;   ROBOTIC ASSISTED TOTAL HYSTERECTOMY WITH BILATERAL SALPINGO OOPHERECTOMY N/A 04/06/2023   Procedure: XI ROBOTIC ASSISTED TOTAL HYSTERECTOMY WITH BILATERAL SALPINGO OOPHORECTOMY;  Surgeon: Carver Fila, MD;  Location: [facility];  Service: Gynecology;  Laterality: N/A;   SENTINEL NODE BIOPSY N/A 04/06/2023   Procedure: SENTINEL NODE BIOPSY;  Surgeon: Carver Fila, MD;  Location: [facility];  Service: Gynecology;  Laterality: N/A;   TOTAL ABDOMINAL HYSTERECTOMY     Family History  Problem Relation Age of Onset   Hypertension Mother    Kidney disease Mother    Stomach cancer Father    Hyperlipidemia Sister    Hypothyroidism Sister    Prostate cancer Brother    Hypertension Brother    Kidney disease Brother    Stroke Brother    Prostate cancer Brother    Breast cancer Neg Hx    Pancreatic cancer Neg Hx    Ovarian cancer Neg Hx    Endometrial cancer Neg  Hx    Social History   Socioeconomic History   Marital status: Single    Spouse name: Not on file   Number of children: 1   Years of education: 12   Highest education level: Not on file  Occupational History   Occupation: Retired  Tobacco Use   Smoking status: Never   Smokeless tobacco: Never  Vaping Use   Vaping status: Never Used  Substance and Sexual Activity   Alcohol use: Never   Drug use: Never   Sexual activity: Not Currently  Other Topics Concern  Not on file  Social History Narrative   Lives alone in house, does not need assist device.  Works part time at [employer]. Has a son.     Cousin and sister in close proximity   Right handed   2 cups coffee per day   Social Drivers of Health   Financial Resource Strain: Low Risk  (12/28/2023)   Overall Financial Resource Strain (CARDIA)    Difficulty of Paying Living Expenses: Not hard at all  Food Insecurity: No Food Insecurity (12/28/2023)   Hunger Vital Sign    Worried About Running Out of Food in the Last Year: Never true    Ran Out of Food in the Last Year: Never true  Transportation Needs: No Transportation Needs (12/28/2023)   PRAPARE - Administrator, Civil Service (Medical): No    Lack of Transportation (Non-Medical): No  Physical Activity: Insufficiently Active (12/28/2023)   Exercise Vital Sign    Days of Exercise per Week: 2 days    Minutes of Exercise per Session: 30 min  Stress: Stress Concern Present (12/28/2023)   [employer] of Occupational Health - Occupational Stress Questionnaire    Feeling of Stress : To some extent  Social Connections: Socially Isolated (12/28/2023)   Social Connection and Isolation Panel [NHANES]    Frequency of Communication with Friends and Family: Three times a week    Frequency of Social Gatherings with Friends and Family: More than three times a week    Attends Religious Services: Never    Database administrator or Organizations: No     Attends Engineer, structural: Never    Marital Status: Divorced    Tobacco Counseling Counseling given: Not Answered    Clinical Intake:  Pre-visit preparation completed: Yes  Pain : No/denies pain     Nutritional Status: BMI of 19-24  Normal Nutritional Risks: None (some dizziness yesterday) Diabetes: No  Lab Results  Component Value Date   HGBA1C 5.5 11/08/2023   HGBA1C 5.6 11/04/2022   HGBA1C 5.5 03/02/2022     How often do you need to have someone help you when you read instructions, pamphlets, or other written materials from your doctor or pharmacy?: 1 - Never  Interpreter Needed?: No  Information entered by :: NAllen LPN   Activities of Daily Living     12/28/2023    2:37 PM 03/24/2023   10:23 AM  In your present state of health, do you have any difficulty performing the following activities:  Hearing? 1   Comment needs hearing aids   Vision? 0   Difficulty concentrating or making decisions? 1   Comment sometimes   Walking or climbing stairs? 1   Dressing or bathing? 1   Doing errands, shopping? 1 0  Preparing Food and eating ? N   Using the Toilet? N   In the past six months, have you accidently leaked urine? Y   Comment wears depends   Do you have problems with loss of bowel control? N   Managing your Medications? N   Managing your Finances? N   Housekeeping or managing your Housekeeping? Y     Patient Care Team: Arnette Felts, FNP as PCP - General (General Practice) Barnett Abu, MD as Consulting Physician (Neurosurgery) Cottle, Steva Ready., MD as Attending Physician (Psychiatry) Harlan Stains, RPH (Inactive) (Pharmacist) Clarene Duke Karma Lew, RN as [facility] Care Management  Indicate any recent Medical Services you may have received from  other than [facility] providers in the past year (date may be approximate).     Assessment:   This is a routine wellness examination for Mirka.  Hearing/Vision screen Hearing  Screening - Comments:: Trouble hearing, but no hearing aids Vision Screening - Comments:: No regular eye exams   Goals Addressed             This Visit's Progress    Patient Stated       12/28/2023, wants to gain weight and strengthen hands       Depression Screen     12/28/2023    2:54 PM 11/08/2023   11:53 AM 07/04/2023   12:16 PM 12/16/2022    2:50 PM 11/04/2022    2:19 PM 06/22/2022    3:58 PM 05/31/2022    9:03 AM  PHQ 2/9 Scores  PHQ - 2 Score 1 4 6  0 2 3 6   PHQ- 9 Score 3 14 20 2 12 17 23     Fall Risk     12/28/2023    2:53 PM 11/08/2023   11:15 AM 07/04/2023   12:16 PM 12/16/2022    2:49 PM 11/04/2022    2:14 PM  Fall Risk   Falls in the past year? 0 0 0 1 1  Comment    don't know   Number falls in past yr: 0 0 0 0 0  Injury with Fall? 0 0 0 1 1  Risk for fall due to : Impaired balance/gait;Impaired mobility;Medication side effect No Fall Risks No Fall Risks Impaired balance/gait;Impaired mobility;Medication side effect History of fall(s)  Follow up Falls prevention discussed;Falls evaluation completed Falls evaluation completed Falls evaluation completed Falls prevention discussed;Education provided;Falls evaluation completed Falls evaluation completed    MEDICARE RISK AT HOME:  Medicare Risk at Home Any stairs in or around the home?: No If so, are there any without handrails?: No Home free of loose throw rugs in walkways, pet beds, electrical cords, etc?: Yes Adequate lighting in your home to reduce risk of falls?: Yes Life alert?: No Use of a cane, walker or w/c?: Yes Grab bars in the bathroom?: Yes Shower chair or bench in shower?: Yes Elevated toilet seat or a handicapped toilet?: Yes  TIMED UP AND GO:  Was the test performed?  Yes  Length of time to ambulate 10 feet: 7 sec Gait slow and steady with assistive device  Cognitive Function: 6CIT completed    07/06/2017    1:30 PM 03/25/2017    3:07 PM 06/04/2014    2:26 PM  MMSE - Mini Mental State Exam   Orientation to time 5 5 5   Orientation to Place 5 5 5   Registration 3 3 3   Attention/ Calculation 4 4 5   Recall 1 2 2   Language- name 2 objects 2 2 2   Language- repeat 1 1 1   Language- follow 3 step command 3 3 3   Language- read & follow direction 1 1 1   Write a sentence 1 1 1   Copy design 1 0 0  Total score 27 27 28         12/28/2023    2:55 PM 12/16/2022    2:52 PM 12/03/2021    4:08 PM 11/20/2020    2:07 PM 09/18/2019    2:16 PM  6CIT Screen  What Year? 0 points 0 points 0 points 0 points 0 points  What month? 0 points 0 points 0 points 0 points 0 points  What time? 0 points 0 points 0 points 0  points 0 points  Count back from 20 0 points 4 points 0 points 0 points 0 points  Months in reverse 4 points 4 points 4 points 4 points 0 points  Repeat phrase 4 points 2 points 10 points 4 points 0 points  Total Score 8 points 10 points 14 points 8 points 0 points    Immunizations Immunization History  Administered Date(s) Administered   Fluad Quad(high Dose 65+) 06/28/2022   Fluad Trivalent(High Dose 65+) 07/04/2023   Influenza, High Dose Seasonal PF 07/24/2018, 07/02/2019   Influenza,inj,Quad PF,6+ Mos 12/11/2013, 09/12/2015, 07/20/2016, 08/10/2017   Influenza-Unspecified 08/04/2021   PFIZER(Purple Top)SARS-COV-2 Vaccination 12/24/2019, 01/15/2020, 07/14/2020, 10/09/2020   Pneumococcal Polysaccharide-23 12/11/2013, 11/08/2019   Zoster Recombinant(Shingrix) 03/02/2022, 08/10/2022    Screening Tests Health Maintenance  Topic Date Due   COVID-19 Vaccine (5 - 2024-25 season) 06/12/2023   Pneumonia Vaccine 23+ Years old (2 of 2 - PCV) 07/03/2024 (Originally 11/07/2020)   DTaP/Tdap/Td (1 - Tdap) 11/07/2024 (Originally 07/09/1965)   Medicare Annual Wellness (AWV)  12/27/2024   INFLUENZA VACCINE  Completed   DEXA SCAN  Completed   Hepatitis C Screening  Completed   Zoster Vaccines- Shingrix  Completed   HPV VACCINES  Aged Out   Colonoscopy  Discontinued    Health  Maintenance  Health Maintenance Due  Topic Date Due   COVID-19 Vaccine (5 - 2024-25 season) 06/12/2023   Health Maintenance Items Addressed: Declines covid  Additional Screening:  Vision Screening: Recommended annual ophthalmology exams for early detection of glaucoma and other disorders of the eye.  Dental Screening: Recommended annual dental exams for proper oral hygiene  Community Resource Referral / Chronic Care Management: CRR required this visit?  No   CCM required this visit?  No     Plan:     I have personally reviewed and noted the following in the patient's chart:   Medical and social history Use of alcohol, tobacco or illicit drugs  Current medications and supplements including opioid prescriptions. Patient is not currently taking opioid prescriptions. Functional ability and status Nutritional status Physical activity Advanced directives List of other physicians Hospitalizations, surgeries, and ER visits in previous 12 months Vitals Screenings to include cognitive, depression, and falls Referrals and appointments  In addition, I have reviewed and discussed with patient certain preventive protocols, quality metrics, and best practice recommendations. A written personalized care plan for preventive services as well as general preventive health recommendations were provided to patient.     Barb Merino, LPN   [id number]   After Visit Summary: (In Person-Printed) AVS printed and given to the patient  Notes: Nothing significant to report at this time.

## 2023-12-28 NOTE — Patient Instructions (Addendum)
 Ms. Steven , Thank you for taking time to come for your Medicare Wellness Visit. I appreciate your ongoing commitment to your health goals. Please review the following plan we discussed and let me know if I can assist you in the future.   Referrals/Orders/Follow-Ups/Clinician Recommendations: none  This is a list of the screening recommended for you and due dates:  Health Maintenance  Topic Date Due   COVID-19 Vaccine (5 - 2024-25 season) 06/12/2023   Pneumonia Vaccine (2 of 2 - PCV) 07/03/2024*   DTaP/Tdap/Td vaccine (1 - Tdap) 11/07/2024*   Medicare Annual Wellness Visit  12/27/2024   Flu Shot  Completed   DEXA scan (bone density measurement)  Completed   Hepatitis C Screening  Completed   Zoster (Shingles) Vaccine  Completed   HPV Vaccine  Aged Out   Colon Cancer Screening  Discontinued  *Topic was postponed. The date shown is not the original due date.    Advanced directives: (In Chart) A copy of your advanced directives are scanned into your chart should your provider ever need it.  Next Medicare Annual Wellness Visit scheduled for next year: Yes3  insert Preventive Care attachment Insert FALL PREVENTION attachment if needed

## 2024-01-10 ENCOUNTER — Other Ambulatory Visit: Payer: Self-pay | Admitting: Nurse Practitioner

## 2024-01-10 DIAGNOSIS — R7989 Other specified abnormal findings of blood chemistry: Secondary | ICD-10-CM

## 2024-01-12 ENCOUNTER — Other Ambulatory Visit

## 2024-01-12 DIAGNOSIS — R7989 Other specified abnormal findings of blood chemistry: Secondary | ICD-10-CM | POA: Diagnosis not present

## 2024-01-13 LAB — HEPATIC FUNCTION PANEL
ALT: 28 IU/L (ref 0–32)
AST: 27 IU/L (ref 0–40)
Albumin: 4.1 g/dL (ref 3.8–4.8)
Alkaline Phosphatase: 61 IU/L (ref 44–121)
Bilirubin Total: 0.3 mg/dL (ref 0.0–1.2)
Bilirubin, Direct: 0.1 mg/dL (ref 0.00–0.40)
Total Protein: 6.9 g/dL (ref 6.0–8.5)

## 2024-01-18 ENCOUNTER — Other Ambulatory Visit: Payer: Self-pay

## 2024-03-08 ENCOUNTER — Other Ambulatory Visit: Payer: Self-pay | Admitting: Nurse Practitioner

## 2024-03-08 DIAGNOSIS — I700 Atherosclerosis of aorta: Secondary | ICD-10-CM

## 2024-03-26 NOTE — Progress Notes (Unsigned)
 Gynecologic Oncology Return Clinic Visit  03/27/2024  Reason for Visit: surveillance  Treatment History: Oncology History Overview Note  01/12/23: Pap - atypical glandular cell, favor neoplastic. While overall cellularity of the pap is low, cells look highly atypical and are suspicious for carcinoma favored to be endometrial. HR HPV negative.    Pelvic ultrasound on 01/21/23 revealed a uterus measuring 7.5 x 5.4 x 7 cm.  Multiple fibroids noted measuring up to 3.8 cm.  Endometrium is thickened measuring 24 mm and irregular.  There is a frond-like contour appreciated adjacent to a small amount of endometrial free fluid and a focal masslike area with blood flow measuring up to 4.2 cm.  Left ovary not visualized.  Right ovary contains a 2.3 cm simple appearing cyst.   CT A/P on 02/07/23 reveals a heterogenous lobulated uterus. No evidence of metastatic disease. Minimal sigmoid diverticulosis without diverticulitis.   The patient had recent visit with pulmonology. 1.4% risk of postop pulmonary complication estimated with duration of surgery is less than 3 hours.   Endometrial cancer (HCC)  04/06/2023 Initial Diagnosis   Endometrial cancer (HCC)   04/06/2023 Surgery   Dilation and curettage, robotic-assisted laparoscopic total hysterectomy with bilateral salpingoophorectomy, SLN biopsy bilaterally, peritoneal biopsy, mini-lap for specimen delivery   Findings: On EUA, 10-12 cm mobile uterus. On D&C moderate amount of tissue c/w tumor appreciated after curetting. On intra-abdominal entry, significant adhesions between bilateral liver surfaces and the anterior abdominal wall. Normal appearing stomach, small and large bowel, omentum. No ascites. No adenopathy. Uterus 10 cm with multiple 2-4 cm fibroids including right lateral LUS fibroid. Normal appearing adnexa. Miliary-appearing disease noted on the peritoneum overlying the left uterosacral ligament (resected). No other peritoneal findings. Mapping  successful to bilateral obturator SLNs.  Frozen section from [facility] c/w endometroid cancer, likely low grade.   04/06/2023 Pathology Results   Stage IA2, grade 1 MI 30% Negative SLNs Focus suspicious for LVI?  MMR: loss of MLH1 and PMS2      Interval History: Patient presents today to the office for continued follow up. Patient reports overall doing well.  She denies any vaginal bleeding or discharge.  Reports baseline constipation, unchanged, and uses miralax  for this as needed.  Denies any new urinary symptoms.  Still uses a depends as needed. Tolerating diet with no nausea or emesis. No changes in appetite. No abdominal or pelvic pain. Has baseline shortness of breath intermittently that is unchanged, has seen cardiologist for this. No lower extremity edema or pain. Has recently felt slightly enlarged right cervical lymph node. Denies sore throat or feels of sickness.  Past Medical/Surgical History: Past Medical History:  Diagnosis Date   Allergic rhinitis    Anemia    Anxiety    Arthritis    Bipolar 1 disorder (HCC)    Cancer (HCC)    Depression    Diverticulosis of colon (without mention of hemorrhage) 08/25/2011   Dr. Kimble Pennant   Dyspnea    Endometrial cancer (HCC) 04/06/2023   Generalized weakness 07/04/2023   Hearing loss in left ear    since birth   Hyperlipidemia    Hypertension    resolved   Insomnia    Obesity    Palpitation    Renal disorder    Tardive dyskinesia    Tremor    Vertigo     Past Surgical History:  Procedure Laterality Date   COLONOSCOPY  08/25/2011   Procedure: COLONOSCOPY;  Surgeon: Yves Herb, MD;  Location: [facility];  Service: Endoscopy;  Laterality: N/A;   DILATION AND CURETTAGE OF UTERUS     DILATION AND CURETTAGE OF UTERUS N/A 04/06/2023   Procedure: DILATATION AND CURETTAGE;  Surgeon: Suzi Essex, MD;  Location: [facility];  Service: Gynecology;  Laterality: N/A;   LYMPH NODE DISSECTION N/A 04/06/2023   Procedure: MINI  LAPAROTOMY;  Surgeon: Suzi Essex, MD;  Location: [facility];  Service: Gynecology;  Laterality: N/A;   ROBOTIC ASSISTED TOTAL HYSTERECTOMY WITH BILATERAL SALPINGO OOPHERECTOMY N/A 04/06/2023   Procedure: XI ROBOTIC ASSISTED TOTAL HYSTERECTOMY WITH BILATERAL SALPINGO OOPHORECTOMY;  Surgeon: Suzi Essex, MD;  Location: [facility];  Service: Gynecology;  Laterality: N/A;   SENTINEL NODE BIOPSY N/A 04/06/2023   Procedure: SENTINEL NODE BIOPSY;  Surgeon: Suzi Essex, MD;  Location: [facility];  Service: Gynecology;  Laterality: N/A;   TOTAL ABDOMINAL HYSTERECTOMY      Family History  Problem Relation Age of Onset   Hypertension Mother    Kidney disease Mother    Stomach cancer Father    Hyperlipidemia Sister    Hypothyroidism Sister    Prostate cancer Brother    Hypertension Brother    Kidney disease Brother    Stroke Brother    Prostate cancer Brother    Breast cancer Neg Hx    Pancreatic cancer Neg Hx    Ovarian cancer Neg Hx    Endometrial cancer Neg Hx     Social History   Socioeconomic History   Marital status: Single    Spouse name: Not on file   Number of children: 1   Years of education: 12   Highest education level: Not on file  Occupational History   Occupation: Retired  Tobacco Use   Smoking status: Never   Smokeless tobacco: Never  Vaping Use   Vaping status: Never Used  Substance and Sexual Activity   Alcohol  use: Never   Drug use: Never   Sexual activity: Not Currently  Other Topics Concern   Not on file  Social History Narrative   Lives alone in house, does not need assist device.  Works part time at [employer]. Has a son.     Cousin and sister in close proximity   Right handed   2 cups coffee per day   Social Drivers of Health   Financial Resource Strain: Low Risk  (12/28/2023)   Overall Financial Resource Strain (CARDIA)    Difficulty of Paying Living Expenses: Not hard at all  Food Insecurity: No Food Insecurity  (12/28/2023)   Hunger Vital Sign    Worried About Running Out of Food in the Last Year: Never true    Ran Out of Food in the Last Year: Never true  Transportation Needs: No Transportation Needs (12/28/2023)   PRAPARE - Administrator, Civil Service (Medical): No    Lack of Transportation (Non-Medical): No  Physical Activity: Insufficiently Active (12/28/2023)   Exercise Vital Sign    Days of Exercise per Week: 2 days    Minutes of Exercise per Session: 30 min  Stress: Stress Concern Present (12/28/2023)   [employer] of Occupational Health - Occupational Stress Questionnaire    Feeling of Stress : To some extent  Social Connections: Socially Isolated (12/28/2023)   Social Connection and Isolation Panel    Frequency of Communication with Friends and Family: Three times a week    Frequency of Social Gatherings with Friends and Family: More than three times a week  Attends Religious Services: Never    Active Member of Clubs or Organizations: No    Attends Banker Meetings: Never    Marital Status: Divorced    Current Medications:  Current Outpatient Medications:    acetaminophen  (TYLENOL ) 325 MG tablet, Take 2 tablets (650 mg total) by mouth every 6 (six) hours as needed for mild pain (or Fever >/= 101)., Disp: , Rfl:    albuterol  (VENTOLIN  HFA) 108 (90 Base) MCG/ACT inhaler, Inhale 2 puffs into the lungs every 6 (six) hours as needed for wheezing or shortness of breath., Disp: 18 each, Rfl: 2   aspirin  EC 81 MG tablet, Take 1 tablet (81 mg total) by mouth daily. Swallow whole., Disp: 90 tablet, Rfl: 3   atorvastatin  (LIPITOR) 10 MG tablet, TAKE 1 TABLET BY MOUTH EVERY DAY, Disp: 90 tablet, Rfl: 1   b complex vitamins capsule, Take 1 capsule by mouth daily. With C, Disp: , Rfl:    budesonide  (PULMICORT ) 0.5 MG/2ML nebulizer solution, Take 2 mLs (0.5 mg total) by nebulization 2 (two) times daily. DX:J45.40, Disp: 120 mL, Rfl: 12   buPROPion  (WELLBUTRIN  XL) 150  MG 24 hr tablet, Take 1 tablet (150 mg total) by mouth daily., Disp: 90 tablet, Rfl: 1   cholecalciferol (VITAMIN D3) 25 MCG (1000 UNIT) tablet, Take 1,000 Units by mouth daily., Disp: , Rfl:    INGREZZA  80 MG capsule, Take 1 capsule (80 mg total) by mouth daily., Disp: 30 capsule, Rfl: 5   ipratropium-albuterol  (DUONEB) 0.5-2.5 (3) MG/3ML SOLN, 1 treatment in the morning, 1 treatment in the evenings, every 6 hours as needed in between for shortness of breath or wheezing DX: J45.40, Disp: 360 mL, Rfl: 0   meclizine  (ANTIVERT ) 12.5 MG tablet, Take 1 tablet (12.5 mg total) by mouth 3 (three) times daily as needed for dizziness., Disp: 30 tablet, Rfl: 2   metoprolol  succinate (TOPROL -XL) 25 MG 24 hr tablet, Take 1 tablet (25 mg total) by mouth daily. Take with or immediately following a meal. (Patient not taking: Reported on 11/08/2023), Disp: 90 tablet, Rfl: 3   mirtazapine  (REMERON ) 7.5 MG tablet, TAKE HALF TO ONE TABLET BY MOUTH AT BEDTIME AS NEEDED, Disp: 90 tablet, Rfl: 0   Omega-3 Fatty Acids (FISH OIL BURP-LESS PO), Take 700 mg by mouth daily., Disp: , Rfl:    omeprazole  (PRILOSEC) 20 MG capsule, Take 1 capsule (20 mg total) by mouth 2 (two) times daily before a meal. (Patient not taking: Reported on 12/28/2023), Disp: 14 capsule, Rfl: 0   polyethylene glycol (MIRALAX  / GLYCOLAX ) 17 g packet, Take 17 g by mouth daily., Disp: 30 each, Rfl: 2   propranolol  (INDERAL ) 10 MG tablet, TAKE 1 TABLET BY MOUTH THREE TIMES A DAY AS NEEDED (Patient not taking: Reported on 12/15/2023), Disp: 270 tablet, Rfl: 2  Review of Systems: No new symptoms reported on intake ROS form Denies appetite changes, fevers, chills, fatigue, unexplained weight changes. Denies hearing loss, neck lumps or masses, mouth sores, ringing in ears or voice changes. Denies cough or wheezing.  Denies worsening shortness of breath. Denies chest pain or palpitations. Denies leg swelling. Denies abdominal distention, pain, blood in stools,  diarrhea, nausea, vomiting, or early satiety. Denies pain with intercourse, dysuria, frequency, hematuria. Denies pelvic pain, vaginal bleeding or vaginal discharge.   Denies joint pain, back pain or muscle pain/cramps. Denies itching, rash, or wounds. Denies dizziness, headaches, numbness or seizures. Denies glands, denies easy bruising or bleeding. Has recently felt slightly enlarged right  cervical lymph node. Denies anxiety, depression, confusion, or decreased concentration.  Physical Exam: BP 136/74 (BP Location: Left Arm, Patient Position: Sitting)   Pulse 82   Resp 18   Ht 5' 9 (1.753 m)   Wt 128 lb (58.1 kg)   SpO2 100%   BMI 18.90 kg/m  General: Alert, oriented, no acute distress. HEENT: Sclera anicteric. Chest: Clear to auscultation bilaterally.  No wheezes or rhonchi. Cardiovascular: Regular rate and rhythm, no murmurs. Abdomen: soft, nontender.  Normoactive bowel sounds.  No masses or hepatosplenomegaly appreciated.  Well-healed incisions. Extremities: Grossly normal range of motion.  Warm, well perfused.  No edema bilaterally. Skin: No rashes or lesions noted. Lymphatics: No supraclavicular or inguinal adenopathy. Right cervical lymph node more prominent compared to left.  GU: Normal appearing external genitalia without erythema, excoriation, or lesions.  Speculum exam reveals atrophic vaginal mucosa, cuff intact.  Bimanual exam reveals no masses or nodularity.    Laboratory & Radiologic Studies: None new  Assessment & Plan: IVAL BASQUEZ is a 78 y.o. 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 ? Single focus of LVI. Surgery 03/2023. MSI-H, MLH1 hypermethylation present. P53 WT.   The patient is overall doing well, NED on exam today. No new symptoms reported. We discussed monitoring vs imaging the right cervical lymph node. Family member with patient would like to wait and will call if ultrasound to further evaluate  is desired.     Per NCCN surveillance recommendations, plan for surveillance visits every 6 months. Follow up has been arranged with Dr. Orvil Bland in six months. We reviewed signs and symptoms that should prompt a phone call between visits and would be concerning for cancer recurrence.  20 minutes of total time was spent for this patient encounter, including preparation, face-to-face counseling with the patient and coordination of care, and documentation of the encounter.  Vira Grieves NP [facility]

## 2024-03-27 ENCOUNTER — Inpatient Hospital Stay: Payer: Medicare Other | Attending: Gynecologic Oncology | Admitting: Gynecologic Oncology

## 2024-03-27 VITALS — BP 136/74 | HR 82 | Resp 18 | Ht 69.0 in | Wt 128.0 lb

## 2024-03-27 DIAGNOSIS — Z90722 Acquired absence of ovaries, bilateral: Secondary | ICD-10-CM | POA: Insufficient documentation

## 2024-03-27 DIAGNOSIS — R590 Localized enlarged lymph nodes: Secondary | ICD-10-CM | POA: Diagnosis not present

## 2024-03-27 DIAGNOSIS — Z90710 Acquired absence of both cervix and uterus: Secondary | ICD-10-CM | POA: Insufficient documentation

## 2024-03-27 DIAGNOSIS — Z9079 Acquired absence of other genital organ(s): Secondary | ICD-10-CM | POA: Diagnosis not present

## 2024-03-27 DIAGNOSIS — Z8542 Personal history of malignant neoplasm of other parts of uterus: Secondary | ICD-10-CM | POA: Diagnosis present

## 2024-03-27 DIAGNOSIS — C541 Malignant neoplasm of endometrium: Secondary | ICD-10-CM

## 2024-03-27 DIAGNOSIS — R599 Enlarged lymph nodes, unspecified: Secondary | ICD-10-CM

## 2024-03-27 NOTE — Patient Instructions (Addendum)
 It was good to see you today.  I do not see or feel any evidence of cancer recurrence on your exam.  You can monitor the right neck lymph node and if this remains, we can always do an ultrasound of the area. If this resolves, we can cancel the ultrasound.   We will see you for follow-up in 6 months or sooner if needed.   As always, if you develop any new and concerning symptoms before your next visit, please call to see me sooner.  Symptoms to report to your health care team include vaginal bleeding, rectal bleeding, bloating, weight loss without effort, new and persistent pain, new and  persistent fatigue, new leg swelling, new masses (i.e., bumps in your neck or groin), new and persistent cough, new and persistent nausea and vomiting, change in bowel or bladder habits, and any other concerns.

## 2024-04-30 ENCOUNTER — Other Ambulatory Visit: Payer: Self-pay | Admitting: Gynecologic Oncology

## 2024-04-30 ENCOUNTER — Telehealth: Payer: Self-pay | Admitting: Surgery

## 2024-04-30 DIAGNOSIS — R599 Enlarged lymph nodes, unspecified: Secondary | ICD-10-CM

## 2024-04-30 NOTE — Telephone Encounter (Signed)
 Patient family member called back stating that at her most recent appointment she discussed with Melissa that if her lymph node did not improve they were to call back to schedule an ultrasound. Patient family member states lymph node is still there and they would like to schedule ultrasound at this time. Advised that [name] would be notified and someone from our office would call her back with ultrasound date and time. Requested that our office call patient's brother, [name], with this information.

## 2024-04-30 NOTE — Progress Notes (Signed)
 See RN note. Patient's family called back and would like to proceed with neck ultrasound given persistent right cervical lymph node enlargement.

## 2024-05-01 ENCOUNTER — Telehealth: Payer: Self-pay | Admitting: *Deleted

## 2024-05-01 NOTE — Telephone Encounter (Signed)
 Attempted to reach patient to relay appt. For ultrasound on 05/04/24 at 1:30 [place] [facility] arrive by 1:15. Left voicemail requesting call back.

## 2024-05-01 NOTE — Telephone Encounter (Signed)
 Spoke with patient's brother Victory and relayed that Ms. Lint's ultrasound is scheduled for this Friday, July 25 th at 1:30 with arrival time of 1:15 at [facility]. Victory verbalized understanding and thanked the office for calling.

## 2024-05-04 ENCOUNTER — Ambulatory Visit (HOSPITAL_COMMUNITY)
Admission: RE | Admit: 2024-05-04 | Discharge: 2024-05-04 | Disposition: A | Discharge status: Home / Self Care | Source: Ambulatory Visit | Attending: Gynecologic Oncology | Admitting: Gynecologic Oncology

## 2024-05-04 DIAGNOSIS — R599 Enlarged lymph nodes, unspecified: Secondary | ICD-10-CM | POA: Insufficient documentation

## 2024-05-08 ENCOUNTER — Ambulatory Visit: Payer: Medicare Other | Admitting: Nurse Practitioner

## 2024-05-08 ENCOUNTER — Encounter: Payer: Self-pay | Admitting: Nurse Practitioner

## 2024-05-08 VITALS — BP 120/70 | HR 75 | Temp 98.2°F | Ht 69.0 in | Wt 127.0 lb

## 2024-05-08 DIAGNOSIS — E782 Mixed hyperlipidemia: Secondary | ICD-10-CM | POA: Diagnosis not present

## 2024-05-08 DIAGNOSIS — R2681 Unsteadiness on feet: Secondary | ICD-10-CM | POA: Diagnosis not present

## 2024-05-08 DIAGNOSIS — Z79899 Other long term (current) drug therapy: Secondary | ICD-10-CM | POA: Diagnosis not present

## 2024-05-08 DIAGNOSIS — R0609 Other forms of dyspnea: Secondary | ICD-10-CM

## 2024-05-08 DIAGNOSIS — R1310 Dysphagia, unspecified: Secondary | ICD-10-CM | POA: Diagnosis not present

## 2024-05-08 DIAGNOSIS — Z8659 Personal history of other mental and behavioral disorders: Secondary | ICD-10-CM

## 2024-05-08 DIAGNOSIS — I10 Essential (primary) hypertension: Secondary | ICD-10-CM | POA: Diagnosis not present

## 2024-05-08 DIAGNOSIS — R7309 Other abnormal glucose: Secondary | ICD-10-CM

## 2024-05-08 DIAGNOSIS — F3130 Bipolar disorder, current episode depressed, mild or moderate severity, unspecified: Secondary | ICD-10-CM

## 2024-05-08 DIAGNOSIS — E46 Unspecified protein-calorie malnutrition: Secondary | ICD-10-CM | POA: Diagnosis not present

## 2024-05-08 DIAGNOSIS — K219 Gastro-esophageal reflux disease without esophagitis: Secondary | ICD-10-CM | POA: Diagnosis not present

## 2024-05-08 DIAGNOSIS — I700 Atherosclerosis of aorta: Secondary | ICD-10-CM

## 2024-05-08 NOTE — Patient Instructions (Signed)
 Hypertension, Adult Hypertension is another name for high blood pressure. High blood pressure forces your heart to work harder to pump blood. This can cause problems over time. There are two numbers in a blood pressure reading. There is a top number (systolic) over a bottom number (diastolic). It is best to have a blood pressure that is below 120/80. What are the causes? The cause of this condition is not known. Some other conditions can lead to high blood pressure. What increases the risk? Some lifestyle factors can make you more likely to develop high blood pressure: Smoking. Not getting enough exercise or physical activity. Being overweight. Having too much fat, sugar, calories, or salt (sodium) in your diet. Drinking too much alcohol. Other risk factors include: Having any of these conditions: Heart disease. Diabetes. High cholesterol. Kidney disease. Obstructive sleep apnea. Having a family history of high blood pressure and high cholesterol. Age. The risk increases with age. Stress. What are the signs or symptoms? High blood pressure may not cause symptoms. Very high blood pressure (hypertensive crisis) may cause: Headache. Fast or uneven heartbeats (palpitations). Shortness of breath. Nosebleed. Vomiting or feeling like you may vomit (nauseous). Changes in how you see. Very bad chest pain. Feeling dizzy. Seizures. How is this treated? This condition is treated by making healthy lifestyle changes, such as: Eating healthy foods. Exercising more. Drinking less alcohol. Your doctor may prescribe medicine if lifestyle changes do not help enough and if: Your top number is above 130. Your bottom number is above 80. Your personal target blood pressure may vary. Follow these instructions at home: Eating and drinking  If told, follow the DASH eating plan. To follow this plan: Fill one half of your plate at each meal with fruits and vegetables. Fill one fourth of your plate  at each meal with whole grains. Whole grains include whole-wheat pasta, brown rice, and whole-grain bread. Eat or drink low-fat dairy products, such as skim milk or low-fat yogurt. Fill one fourth of your plate at each meal with low-fat (lean) proteins. Low-fat proteins include fish, chicken without skin, eggs, beans, and tofu. Avoid fatty meat, cured and processed meat, or chicken with skin. Avoid pre-made or processed food. Limit the amount of salt in your diet to less than 1,500 mg each day. Do not drink alcohol if: Your doctor tells you not to drink. You are pregnant, may be pregnant, or are planning to become pregnant. If you drink alcohol: Limit how much you have to: 0-1 drink a day for women. 0-2 drinks a day for men. Know how much alcohol is in your drink. In the [place] one drink equals one 12 oz bottle of beer (355 mL), one 5 oz glass of wine (148 mL), or one 1 oz glass of hard liquor (44 mL). Lifestyle  Work with your doctor to stay at a healthy weight or to lose weight. Ask your doctor what the best weight is for you. Get at least 30 minutes of exercise that causes your heart to beat faster (aerobic exercise) most days of the week. This may include walking, swimming, or biking. Get at least 30 minutes of exercise that strengthens your muscles (resistance exercise) at least 3 days a week. This may include lifting weights or doing Pilates. Do not smoke or use any products that contain nicotine or tobacco. If you need help quitting, ask your doctor. Check your blood pressure at home as told by your doctor. Keep all follow-up visits. Medicines Take over-the-counter and prescription medicines  only as told by your doctor. Follow directions carefully. Do not skip doses of blood pressure medicine. The medicine does not work as well if you skip doses. Skipping doses also puts you at risk for problems. Ask your doctor about side effects or reactions to medicines that you should watch  for. Contact a doctor if: You think you are having a reaction to the medicine you are taking. You have headaches that keep coming back. You feel dizzy. You have swelling in your ankles. You have trouble with your vision. Get help right away if: You get a very bad headache. You start to feel mixed up (confused). You feel weak or numb. You feel faint. You have very bad pain in your: Chest. Belly (abdomen). You vomit more than once. You have trouble breathing. These symptoms may be an emergency. Get help right away. Call 911. Do not wait to see if the symptoms will go away. Do not drive yourself to the hospital. Summary Hypertension is another name for high blood pressure. High blood pressure forces your heart to work harder to pump blood. For most people, a normal blood pressure is less than 120/80. Making healthy choices can help lower blood pressure. If your blood pressure does not get lower with healthy choices, you may need to take medicine. This information is not intended to replace advice given to you by your health care provider. Make sure you discuss any questions you have with your health care provider. Document Revised: 07/16/2021 Document Reviewed: 07/16/2021 Elsevier Patient Education  2024 [employer].

## 2024-05-08 NOTE — Progress Notes (Addendum)
 I,Jameka J Llittleton, CMA,acting as a Neurosurgeon for [employer], FNP.,have documented all relevant documentation on the behalf of Gaines Ada, FNP,as directed by  Gaines Ada, FNP while in the presence of Gaines Ada, FNP.  Subjective:  Patient ID: Judy [name] , female    DOB: 01-28-1946 , 78 y.o.   MRN: [id number]  Chief Complaint  Patient presents with   Hypertension    Patient presents today for bpc. Patient reports compliance with her meds. Patient denies having chest pain,sob or headaches at this time. Patient is wanting referral for PT.  Patient also complains of difficulty swallowing.     HPI Discussed the use of AI scribe software for clinical note transcription with the patient, who gave verbal consent to proceed.  History of Present Illness Judy [name] is a 78 year old female who presents for a follow-up visit to monitor her prediabetes, hypertension, and cholesterol levels. She is accompanied by a family member who assists with her care.  She has a history of prediabetes with a previous A1C of 5.6. She is not on specific medication for hypertension, although she takes metoprolol .  She experiences persistent shortness of breath and uses an inhaler, though there are concerns about her technique and medication effectiveness. She has a DuoNeb machine for breathing treatments, which she uses infrequently despite regular shortness of breath.  Her nutritional intake is concerning as she relies on processed foods and meals on wheels. She drinks two Ensure supplements daily but has a low BMI of 18.75. She has difficulty swallowing, particularly solids, and has undergone a swallow test in the past. She experiences GERD symptoms, possibly related to her diet and hydration habits, and drinks two bottles of water  a day.  She walks with difficulty and has a history of a fall where she was unable to get up. Her family is preparing for a potential move, which may impact her support system,  and they encourage her to become more independent in managing her daily activities and healthcare needs.    Hypertension This is a chronic problem. The current episode started more than 1 year ago. The problem is unchanged. The problem is controlled. Pertinent negatives include no chest pain, headaches or palpitations. Risk factors for coronary artery disease include sedentary lifestyle. Past treatments include calcium  channel blockers. There are no compliance problems.      Past Medical History:  Diagnosis Date   Allergic rhinitis    Anemia    Anxiety    Arthritis    Bipolar 1 disorder (HCC)    Cancer (HCC)    Depression    Diverticulosis of colon (without mention of hemorrhage) 08/25/2011   Dr. Burnette   Dyspnea    Endometrial cancer [facility]) 04/06/2023   Generalized weakness 07/04/2023   Hearing loss in left ear    since birth   Hyperlipidemia    Hypertension    resolved   Insomnia    Obesity    Palpitation    Renal disorder    Tardive dyskinesia    Tremor    Vertigo      Family History  Problem Relation Age of Onset   Hypertension Mother    Kidney disease Mother    Stomach cancer Father    Hyperlipidemia Sister    Hypothyroidism Sister    Prostate cancer Brother    Hypertension Brother    Kidney disease Brother    Stroke Brother    Prostate cancer Brother    Breast cancer Neg  Hx    Pancreatic cancer Neg Hx    Ovarian cancer Neg Hx    Endometrial cancer Neg Hx      Current Outpatient Medications:    acetaminophen  (TYLENOL ) 325 MG tablet, Take 2 tablets (650 mg total) by mouth every 6 (six) hours as needed for mild pain (or Fever >/= 101)., Disp: , Rfl:    albuterol  (VENTOLIN  HFA) 108 (90 Base) MCG/ACT inhaler, Inhale 2 puffs into the lungs every 6 (six) hours as needed for wheezing or shortness of breath., Disp: 18 each, Rfl: 2   aspirin  EC 81 MG tablet, Take 1 tablet (81 mg total) by mouth daily. Swallow whole., Disp: 90 tablet, Rfl: 3   atorvastatin   (LIPITOR) 10 MG tablet, TAKE 1 TABLET BY MOUTH EVERY DAY, Disp: 90 tablet, Rfl: 1   b complex vitamins capsule, Take 1 capsule by mouth daily. With C, Disp: , Rfl:    budesonide  (PULMICORT ) 0.5 MG/2ML nebulizer solution, Take 2 mLs (0.5 mg total) by nebulization 2 (two) times daily. DX:J45.40, Disp: 120 mL, Rfl: 12   buPROPion  (WELLBUTRIN  XL) 150 MG 24 hr tablet, Take 1 tablet (150 mg total) by mouth daily., Disp: 90 tablet, Rfl: 1   cholecalciferol (VITAMIN D3) 25 MCG (1000 UNIT) tablet, Take 1,000 Units by mouth daily., Disp: , Rfl:    INGREZZA  80 MG capsule, Take 1 capsule (80 mg total) by mouth daily., Disp: 30 capsule, Rfl: 5   ipratropium-albuterol  (DUONEB) 0.5-2.5 (3) MG/3ML SOLN, 1 treatment in the morning, 1 treatment in the evenings, every 6 hours as needed in between for shortness of breath or wheezing DX: J45.40, Disp: 360 mL, Rfl: 0   meclizine  (ANTIVERT ) 12.5 MG tablet, Take 1 tablet (12.5 mg total) by mouth 3 (three) times daily as needed for dizziness., Disp: 30 tablet, Rfl: 2   metoprolol  succinate (TOPROL -XL) 25 MG 24 hr tablet, Take 1 tablet (25 mg total) by mouth daily. Take with or immediately following a meal., Disp: 90 tablet, Rfl: 3   mirtazapine  (REMERON ) 7.5 MG tablet, TAKE HALF TO ONE TABLET BY MOUTH AT BEDTIME AS NEEDED, Disp: 90 tablet, Rfl: 0   Omega-3 Fatty Acids (FISH OIL BURP-LESS PO), Take 700 mg by mouth daily., Disp: , Rfl:    omeprazole  (PRILOSEC) 20 MG capsule, Take 1 capsule (20 mg total) by mouth 2 (two) times daily before a meal., Disp: 14 capsule, Rfl: 0   polyethylene glycol (MIRALAX  / GLYCOLAX ) 17 g packet, Take 17 g by mouth daily., Disp: 30 each, Rfl: 2   propranolol  (INDERAL ) 10 MG tablet, TAKE 1 TABLET BY MOUTH THREE TIMES A DAY AS NEEDED, Disp: 270 tablet, Rfl: 2   No Known Allergies   Review of Systems  Constitutional: Negative.   Eyes: Negative.   Respiratory: Negative.    Cardiovascular:  Negative for chest pain and palpitations.   Musculoskeletal: Negative.   Skin: Negative.   Neurological:  Negative for headaches.  Psychiatric/Behavioral: Negative.       Today's Vitals   05/08/24 1112  BP: 120/70  Pulse: 75  Temp: 98.2 F (36.8 C)  Weight: 127 lb (57.6 kg)  Height: 5' 9 (1.753 m)  PainSc: 0-No pain   Body mass index is 18.75 kg/m.  Wt Readings from Last 3 Encounters:  05/08/24 127 lb (57.6 kg)  03/27/24 128 lb (58.1 kg)  12/28/23 130 lb 9.6 oz (59.2 kg)     Objective:  Physical Exam Vitals and nursing note reviewed.  Constitutional:  General: She is not in acute distress.    Appearance: Normal appearance. She is well-developed.  HENT:     Head: Normocephalic and atraumatic.  Cardiovascular:     Rate and Rhythm: Normal rate and regular rhythm.     Pulses: Normal pulses.     Heart sounds: Normal heart sounds. No murmur heard. Pulmonary:     Effort: Pulmonary effort is normal. No respiratory distress.     Breath sounds: Normal breath sounds. No wheezing.  Musculoskeletal:        General: No swelling. Normal range of motion.     Comments: Using a cane  Skin:    General: Skin is warm and dry.     Capillary Refill: Capillary refill takes less than 2 seconds.  Neurological:     General: No focal deficit present.     Mental Status: She is alert and oriented to person, place, and time.     Cranial Nerves: No cranial nerve deficit.     Motor: No weakness.  Psychiatric:        Mood and Affect: Mood normal.        Behavior: Behavior normal.        Thought Content: Thought content normal.        Judgment: Judgment normal.       Assessment And Plan:  Essential hypertension Assessment & Plan: Blood pressure well-controlled at 120/70 mmHg with metoprolol  for heart rate control. - Consider yearly EKG and urine check.  Orders: -     CMP14+EGFR  Abnormal glucose Assessment & Plan: Previous A1c over 5.6; monitoring to prevent diabetes progression.   Mixed hyperlipidemia Assessment  & Plan: Stable cholesterol levels over the last few years. - Check cholesterol levels during this visit.  Orders: -     Lipid panel  Aortic atherosclerosis (HCC) -     Lipid panel  Other long term (current) drug therapy -     CBC -     TSH  History of bipolar disorder  Gait instability Assessment & Plan: Unsteady gait increases fall risk. - Order home physical therapy.  Orders: -     Ambulatory referral to Home Health  Malnutrition, unspecified type (HCC) Assessment & Plan: Low-normal BMI with diet high in processed foods and Ensure supplements. - Refer to nutritionist. - Encourage more vegetables and fruits, less processed foods.   Gastroesophageal reflux disease without esophagitis Assessment & Plan: Difficulty with mucus clearance possibly related to GERD. Discussed dietary modifications and probiotic use. - Increase water  intake to four 16-ounce bottles daily. - Consider probiotic. - Avoid lying down after eating.   Dysphagia, unspecified type Assessment & Plan: Difficulty swallowing; awaiting ultrasound results for anatomical assessment post-cancer surgery. - Await ultrasound results.   Dyspnea on exertion Assessment & Plan: Chronic shortness of breath possibly due to improper inhaler technique and weather conditions. - Order spacer for inhaler. - Advise using DuoNeb once daily for a week. - Encourage increased nebulizer use with mouthpiece.     Return in about 3 months (around 08/08/2024) for bpc.  Patient was given opportunity to ask questions. Patient verbalized understanding of the plan and was able to repeat key elements of the plan. All questions were answered to their satisfaction.    LILLETTE Gaines Ada, FNP, have reviewed all documentation for this visit. The documentation on 05/08/24 for the exam, diagnosis, procedures, and orders are all accurate and complete.   IF YOU HAVE BEEN REFERRED TO A SPECIALIST, IT MAY TAKE 1-2 WEEKS  TO  SCHEDULE/PROCESS THE REFERRAL. IF YOU HAVE NOT HEARD FROM US /SPECIALIST IN TWO WEEKS, PLEASE GIVE US  A CALL AT [phone].

## 2024-05-09 ENCOUNTER — Telehealth: Payer: Self-pay | Admitting: *Deleted

## 2024-05-09 LAB — CBC
Hematocrit: 44.6 % (ref 34.0–46.6)
Hemoglobin: 14.2 g/dL (ref 11.1–15.9)
MCH: 31.4 pg (ref 26.6–33.0)
MCHC: 31.8 g/dL (ref 31.5–35.7)
MCV: 99 fL — ABNORMAL HIGH (ref 79–97)
Platelets: 258 x10E3/uL (ref 150–450)
RBC: 4.52 x10E6/uL (ref 3.77–5.28)
RDW: 12.7 % (ref 11.7–15.4)
WBC: 4.7 x10E3/uL (ref 3.4–10.8)

## 2024-05-09 LAB — CMP14+EGFR
ALT: 27 IU/L (ref 0–32)
AST: 26 IU/L (ref 0–40)
Albumin: 4.2 g/dL (ref 3.8–4.8)
Alkaline Phosphatase: 60 IU/L (ref 44–121)
BUN/Creatinine Ratio: 13 (ref 12–28)
BUN: 13 mg/dL (ref 8–27)
Bilirubin Total: 0.3 mg/dL (ref 0.0–1.2)
CO2: 26 mmol/L (ref 20–29)
Calcium: 9.9 mg/dL (ref 8.7–10.3)
Chloride: 102 mmol/L (ref 96–106)
Creatinine, Ser: 1.03 mg/dL — ABNORMAL HIGH (ref 0.57–1.00)
Globulin, Total: 2.5 g/dL (ref 1.5–4.5)
Glucose: 90 mg/dL (ref 70–99)
Potassium: 3.9 mmol/L (ref 3.5–5.2)
Sodium: 143 mmol/L (ref 134–144)
Total Protein: 6.7 g/dL (ref 6.0–8.5)
eGFR: 56 mL/min/1.73 — ABNORMAL LOW (ref >59)

## 2024-05-09 LAB — LIPID PANEL
Chol/HDL Ratio: 2.9 ratio (ref 0.0–4.4)
Cholesterol, Total: 213 mg/dL — ABNORMAL HIGH (ref 100–199)
HDL: 74 mg/dL (ref >39)
LDL Chol Calc (NIH): 126 mg/dL — ABNORMAL HIGH (ref 0–99)
Triglycerides: 76 mg/dL (ref 0–149)
VLDL Cholesterol Cal: 13 mg/dL (ref 5–40)

## 2024-05-09 LAB — TSH: TSH: 0.784 u[IU]/mL (ref 0.450–4.500)

## 2024-05-09 NOTE — Telephone Encounter (Signed)
 Attempted to reach patient and her brother Victory answered stating that Ms. Derocher was unavailable at the moment. Relayed message from [facility] to [place] that we are still waiting for the radiologist to read her scan. As soon as this is back, we will reach back out with the results. Victory thanked the office for calling and states he will relay this message to his sister Tillie.

## 2024-05-09 NOTE — Telephone Encounter (Signed)
-----   Message from [name] JONETTA Epps sent at 05/09/2024 10:43 AM EDT ----- Please let her know we are still waiting for the radiologist to read her scan. As soon as this is back, we will let them know the results.

## 2024-05-14 ENCOUNTER — Telehealth: Payer: Self-pay | Admitting: *Deleted

## 2024-05-14 NOTE — Telephone Encounter (Signed)
-----   Message from Judy [name] sent at 05/14/2024  8:25 AM EDT ----- Please let her know her neck US  has returned. They state the right neck lymph node is benign appearing (not concerning for cancer). They do not recommend follow up. Would monitor and call for any changes.

## 2024-05-14 NOTE — Telephone Encounter (Signed)
 Spoke with patient's brother and relayed message from [name] Epps, NP -Pt's neck ultrasound returned and they state the right neck lymph node is benign appearing (not concerning for cancer). They do not recommend follow up. Would monitor and call for any changes. Patient's brother and wife verbalized understanding, will pass this onto Ms. Perri and results faxed to patient's PCP -Gaines Ada fax # [phone].

## 2024-05-18 ENCOUNTER — Ambulatory Visit: Payer: Self-pay | Admitting: Nurse Practitioner

## 2024-05-18 ENCOUNTER — Encounter: Payer: Self-pay | Admitting: Nurse Practitioner

## 2024-05-18 DIAGNOSIS — E46 Unspecified protein-calorie malnutrition: Secondary | ICD-10-CM | POA: Insufficient documentation

## 2024-05-18 DIAGNOSIS — R2681 Unsteadiness on feet: Secondary | ICD-10-CM | POA: Insufficient documentation

## 2024-05-18 NOTE — Assessment & Plan Note (Signed)
 Unsteady gait increases fall risk. - Order home physical therapy.

## 2024-05-18 NOTE — Assessment & Plan Note (Signed)
 Chronic shortness of breath possibly due to improper inhaler technique and weather conditions. - Order spacer for inhaler. - Advise using DuoNeb once daily for a week. - Encourage increased nebulizer use with mouthpiece.

## 2024-05-18 NOTE — Assessment & Plan Note (Signed)
 Previous A1c over 5.6; monitoring to prevent diabetes progression.

## 2024-05-18 NOTE — Assessment & Plan Note (Signed)
 Low-normal BMI with diet high in processed foods and Ensure supplements. - Refer to nutritionist. - Encourage more vegetables and fruits, less processed foods.

## 2024-05-18 NOTE — Assessment & Plan Note (Signed)
 Difficulty with mucus clearance possibly related to GERD. Discussed dietary modifications and probiotic use. - Increase water  intake to four 16-ounce bottles daily. - Consider probiotic. - Avoid lying down after eating.

## 2024-05-18 NOTE — Assessment & Plan Note (Signed)
 Stable cholesterol levels over the last few years. - Check cholesterol levels during this visit.

## 2024-05-18 NOTE — Assessment & Plan Note (Signed)
 Blood pressure well-controlled at 120/70 mmHg with metoprolol  for heart rate control. - Consider yearly EKG and urine check.

## 2024-05-18 NOTE — Assessment & Plan Note (Signed)
 Difficulty swallowing; awaiting ultrasound results for anatomical assessment post-cancer surgery. - Await ultrasound results.

## 2024-05-28 ENCOUNTER — Other Ambulatory Visit: Payer: Self-pay | Admitting: Physician Assistant

## 2024-05-31 DIAGNOSIS — J309 Allergic rhinitis, unspecified: Secondary | ICD-10-CM | POA: Diagnosis not present

## 2024-05-31 DIAGNOSIS — Z9181 History of falling: Secondary | ICD-10-CM | POA: Diagnosis not present

## 2024-05-31 DIAGNOSIS — I10 Essential (primary) hypertension: Secondary | ICD-10-CM | POA: Diagnosis not present

## 2024-05-31 DIAGNOSIS — E782 Mixed hyperlipidemia: Secondary | ICD-10-CM | POA: Diagnosis not present

## 2024-05-31 DIAGNOSIS — M1990 Unspecified osteoarthritis, unspecified site: Secondary | ICD-10-CM | POA: Diagnosis not present

## 2024-05-31 DIAGNOSIS — G8191 Hemiplegia, unspecified affecting right dominant side: Secondary | ICD-10-CM | POA: Diagnosis not present

## 2024-05-31 DIAGNOSIS — E46 Unspecified protein-calorie malnutrition: Secondary | ICD-10-CM | POA: Diagnosis not present

## 2024-05-31 DIAGNOSIS — N289 Disorder of kidney and ureter, unspecified: Secondary | ICD-10-CM | POA: Diagnosis not present

## 2024-05-31 DIAGNOSIS — R1310 Dysphagia, unspecified: Secondary | ICD-10-CM | POA: Diagnosis not present

## 2024-05-31 DIAGNOSIS — Z7982 Long term (current) use of aspirin: Secondary | ICD-10-CM | POA: Diagnosis not present

## 2024-05-31 DIAGNOSIS — K219 Gastro-esophageal reflux disease without esophagitis: Secondary | ICD-10-CM | POA: Diagnosis not present

## 2024-05-31 DIAGNOSIS — Z5982 Transportation insecurity: Secondary | ICD-10-CM | POA: Diagnosis not present

## 2024-05-31 DIAGNOSIS — D649 Anemia, unspecified: Secondary | ICD-10-CM | POA: Diagnosis not present

## 2024-05-31 DIAGNOSIS — Z6820 Body mass index (BMI) 20.0-20.9, adult: Secondary | ICD-10-CM | POA: Diagnosis not present

## 2024-05-31 DIAGNOSIS — I700 Atherosclerosis of aorta: Secondary | ICD-10-CM | POA: Diagnosis not present

## 2024-05-31 DIAGNOSIS — Z86711 Personal history of pulmonary embolism: Secondary | ICD-10-CM | POA: Diagnosis not present

## 2024-05-31 DIAGNOSIS — G4700 Insomnia, unspecified: Secondary | ICD-10-CM | POA: Diagnosis not present

## 2024-05-31 DIAGNOSIS — R7303 Prediabetes: Secondary | ICD-10-CM | POA: Diagnosis not present

## 2024-05-31 DIAGNOSIS — Z602 Problems related to living alone: Secondary | ICD-10-CM | POA: Diagnosis not present

## 2024-05-31 DIAGNOSIS — H9192 Unspecified hearing loss, left ear: Secondary | ICD-10-CM | POA: Diagnosis not present

## 2024-05-31 DIAGNOSIS — G2401 Drug induced subacute dyskinesia: Secondary | ICD-10-CM | POA: Diagnosis not present

## 2024-06-05 DIAGNOSIS — Z6820 Body mass index (BMI) 20.0-20.9, adult: Secondary | ICD-10-CM | POA: Diagnosis not present

## 2024-06-05 DIAGNOSIS — K219 Gastro-esophageal reflux disease without esophagitis: Secondary | ICD-10-CM | POA: Diagnosis not present

## 2024-06-05 DIAGNOSIS — Z602 Problems related to living alone: Secondary | ICD-10-CM | POA: Diagnosis not present

## 2024-06-05 DIAGNOSIS — G4700 Insomnia, unspecified: Secondary | ICD-10-CM | POA: Diagnosis not present

## 2024-06-05 DIAGNOSIS — N289 Disorder of kidney and ureter, unspecified: Secondary | ICD-10-CM | POA: Diagnosis not present

## 2024-06-05 DIAGNOSIS — I700 Atherosclerosis of aorta: Secondary | ICD-10-CM | POA: Diagnosis not present

## 2024-06-05 DIAGNOSIS — G2401 Drug induced subacute dyskinesia: Secondary | ICD-10-CM | POA: Diagnosis not present

## 2024-06-05 DIAGNOSIS — Z9181 History of falling: Secondary | ICD-10-CM | POA: Diagnosis not present

## 2024-06-05 DIAGNOSIS — R1310 Dysphagia, unspecified: Secondary | ICD-10-CM | POA: Diagnosis not present

## 2024-06-05 DIAGNOSIS — R7303 Prediabetes: Secondary | ICD-10-CM | POA: Diagnosis not present

## 2024-06-05 DIAGNOSIS — I10 Essential (primary) hypertension: Secondary | ICD-10-CM | POA: Diagnosis not present

## 2024-06-05 DIAGNOSIS — G8191 Hemiplegia, unspecified affecting right dominant side: Secondary | ICD-10-CM | POA: Diagnosis not present

## 2024-06-05 DIAGNOSIS — Z5982 Transportation insecurity: Secondary | ICD-10-CM | POA: Diagnosis not present

## 2024-06-05 DIAGNOSIS — Z86711 Personal history of pulmonary embolism: Secondary | ICD-10-CM | POA: Diagnosis not present

## 2024-06-05 DIAGNOSIS — J309 Allergic rhinitis, unspecified: Secondary | ICD-10-CM | POA: Diagnosis not present

## 2024-06-05 DIAGNOSIS — E782 Mixed hyperlipidemia: Secondary | ICD-10-CM | POA: Diagnosis not present

## 2024-06-05 DIAGNOSIS — M1990 Unspecified osteoarthritis, unspecified site: Secondary | ICD-10-CM | POA: Diagnosis not present

## 2024-06-05 DIAGNOSIS — Z7982 Long term (current) use of aspirin: Secondary | ICD-10-CM | POA: Diagnosis not present

## 2024-06-05 DIAGNOSIS — H9192 Unspecified hearing loss, left ear: Secondary | ICD-10-CM | POA: Diagnosis not present

## 2024-06-05 DIAGNOSIS — E46 Unspecified protein-calorie malnutrition: Secondary | ICD-10-CM | POA: Diagnosis not present

## 2024-06-05 DIAGNOSIS — D649 Anemia, unspecified: Secondary | ICD-10-CM | POA: Diagnosis not present

## 2024-06-11 DIAGNOSIS — G4700 Insomnia, unspecified: Secondary | ICD-10-CM | POA: Diagnosis not present

## 2024-06-11 DIAGNOSIS — R7303 Prediabetes: Secondary | ICD-10-CM | POA: Diagnosis not present

## 2024-06-11 DIAGNOSIS — H9192 Unspecified hearing loss, left ear: Secondary | ICD-10-CM | POA: Diagnosis not present

## 2024-06-11 DIAGNOSIS — E782 Mixed hyperlipidemia: Secondary | ICD-10-CM | POA: Diagnosis not present

## 2024-06-11 DIAGNOSIS — R1310 Dysphagia, unspecified: Secondary | ICD-10-CM | POA: Diagnosis not present

## 2024-06-11 DIAGNOSIS — I700 Atherosclerosis of aorta: Secondary | ICD-10-CM | POA: Diagnosis not present

## 2024-06-11 DIAGNOSIS — K219 Gastro-esophageal reflux disease without esophagitis: Secondary | ICD-10-CM | POA: Diagnosis not present

## 2024-06-11 DIAGNOSIS — I10 Essential (primary) hypertension: Secondary | ICD-10-CM | POA: Diagnosis not present

## 2024-06-11 DIAGNOSIS — G8191 Hemiplegia, unspecified affecting right dominant side: Secondary | ICD-10-CM | POA: Diagnosis not present

## 2024-06-11 DIAGNOSIS — Z6820 Body mass index (BMI) 20.0-20.9, adult: Secondary | ICD-10-CM | POA: Diagnosis not present

## 2024-06-11 DIAGNOSIS — Z9181 History of falling: Secondary | ICD-10-CM | POA: Diagnosis not present

## 2024-06-11 DIAGNOSIS — N289 Disorder of kidney and ureter, unspecified: Secondary | ICD-10-CM | POA: Diagnosis not present

## 2024-06-11 DIAGNOSIS — Z5982 Transportation insecurity: Secondary | ICD-10-CM | POA: Diagnosis not present

## 2024-06-11 DIAGNOSIS — D649 Anemia, unspecified: Secondary | ICD-10-CM | POA: Diagnosis not present

## 2024-06-11 DIAGNOSIS — E46 Unspecified protein-calorie malnutrition: Secondary | ICD-10-CM | POA: Diagnosis not present

## 2024-06-11 DIAGNOSIS — Z7982 Long term (current) use of aspirin: Secondary | ICD-10-CM | POA: Diagnosis not present

## 2024-06-11 DIAGNOSIS — J309 Allergic rhinitis, unspecified: Secondary | ICD-10-CM | POA: Diagnosis not present

## 2024-06-11 DIAGNOSIS — Z602 Problems related to living alone: Secondary | ICD-10-CM | POA: Diagnosis not present

## 2024-06-11 DIAGNOSIS — G2401 Drug induced subacute dyskinesia: Secondary | ICD-10-CM | POA: Diagnosis not present

## 2024-06-11 DIAGNOSIS — Z86711 Personal history of pulmonary embolism: Secondary | ICD-10-CM | POA: Diagnosis not present

## 2024-06-11 DIAGNOSIS — M1990 Unspecified osteoarthritis, unspecified site: Secondary | ICD-10-CM | POA: Diagnosis not present

## 2024-06-12 ENCOUNTER — Other Ambulatory Visit: Payer: Self-pay | Admitting: Nurse Practitioner

## 2024-06-12 DIAGNOSIS — R0609 Other forms of dyspnea: Secondary | ICD-10-CM

## 2024-06-13 ENCOUNTER — Telehealth: Payer: Self-pay

## 2024-06-13 NOTE — Telephone Encounter (Signed)
 Copied from CRM #[id number]. Topic: Clinical - Medical Advice >> Jun 13, 2024  8:24 AM Laymon HERO wrote: Reason for CRM: nicole- [facility] [phone]- medication interaction- buPROPion  and INGREZZA - no side effects

## 2024-06-18 DIAGNOSIS — J309 Allergic rhinitis, unspecified: Secondary | ICD-10-CM | POA: Diagnosis not present

## 2024-06-18 DIAGNOSIS — I10 Essential (primary) hypertension: Secondary | ICD-10-CM | POA: Diagnosis not present

## 2024-06-18 DIAGNOSIS — G2401 Drug induced subacute dyskinesia: Secondary | ICD-10-CM | POA: Diagnosis not present

## 2024-06-18 DIAGNOSIS — I700 Atherosclerosis of aorta: Secondary | ICD-10-CM | POA: Diagnosis not present

## 2024-06-18 DIAGNOSIS — Z5982 Transportation insecurity: Secondary | ICD-10-CM | POA: Diagnosis not present

## 2024-06-18 DIAGNOSIS — R7303 Prediabetes: Secondary | ICD-10-CM | POA: Diagnosis not present

## 2024-06-18 DIAGNOSIS — N289 Disorder of kidney and ureter, unspecified: Secondary | ICD-10-CM | POA: Diagnosis not present

## 2024-06-18 DIAGNOSIS — Z602 Problems related to living alone: Secondary | ICD-10-CM | POA: Diagnosis not present

## 2024-06-18 DIAGNOSIS — K219 Gastro-esophageal reflux disease without esophagitis: Secondary | ICD-10-CM | POA: Diagnosis not present

## 2024-06-18 DIAGNOSIS — Z6820 Body mass index (BMI) 20.0-20.9, adult: Secondary | ICD-10-CM | POA: Diagnosis not present

## 2024-06-18 DIAGNOSIS — M1990 Unspecified osteoarthritis, unspecified site: Secondary | ICD-10-CM | POA: Diagnosis not present

## 2024-06-18 DIAGNOSIS — Z86711 Personal history of pulmonary embolism: Secondary | ICD-10-CM | POA: Diagnosis not present

## 2024-06-18 DIAGNOSIS — G4700 Insomnia, unspecified: Secondary | ICD-10-CM | POA: Diagnosis not present

## 2024-06-18 DIAGNOSIS — G8191 Hemiplegia, unspecified affecting right dominant side: Secondary | ICD-10-CM | POA: Diagnosis not present

## 2024-06-18 DIAGNOSIS — R1310 Dysphagia, unspecified: Secondary | ICD-10-CM | POA: Diagnosis not present

## 2024-06-18 DIAGNOSIS — E782 Mixed hyperlipidemia: Secondary | ICD-10-CM | POA: Diagnosis not present

## 2024-06-18 DIAGNOSIS — D649 Anemia, unspecified: Secondary | ICD-10-CM | POA: Diagnosis not present

## 2024-06-18 DIAGNOSIS — E46 Unspecified protein-calorie malnutrition: Secondary | ICD-10-CM | POA: Diagnosis not present

## 2024-06-18 DIAGNOSIS — Z7982 Long term (current) use of aspirin: Secondary | ICD-10-CM | POA: Diagnosis not present

## 2024-06-18 DIAGNOSIS — H9192 Unspecified hearing loss, left ear: Secondary | ICD-10-CM | POA: Diagnosis not present

## 2024-06-18 DIAGNOSIS — Z9181 History of falling: Secondary | ICD-10-CM | POA: Diagnosis not present

## 2024-06-21 ENCOUNTER — Ambulatory Visit: Admitting: Physician Assistant

## 2024-06-21 ENCOUNTER — Encounter: Payer: Self-pay | Admitting: Physician Assistant

## 2024-06-21 ENCOUNTER — Encounter: Payer: Self-pay | Admitting: Pulmonary Disease

## 2024-06-21 ENCOUNTER — Ambulatory Visit: Admitting: Pulmonary Disease

## 2024-06-21 VITALS — BP 126/62 | HR 81 | Temp 97.7°F | Ht 69.0 in | Wt 124.8 lb

## 2024-06-21 DIAGNOSIS — F99 Mental disorder, not otherwise specified: Secondary | ICD-10-CM | POA: Diagnosis not present

## 2024-06-21 DIAGNOSIS — J849 Interstitial pulmonary disease, unspecified: Secondary | ICD-10-CM | POA: Diagnosis not present

## 2024-06-21 DIAGNOSIS — G2401 Drug induced subacute dyskinesia: Secondary | ICD-10-CM | POA: Diagnosis not present

## 2024-06-21 DIAGNOSIS — Z23 Encounter for immunization: Secondary | ICD-10-CM

## 2024-06-21 DIAGNOSIS — J4540 Moderate persistent asthma, uncomplicated: Secondary | ICD-10-CM

## 2024-06-21 DIAGNOSIS — F3341 Major depressive disorder, recurrent, in partial remission: Secondary | ICD-10-CM

## 2024-06-21 DIAGNOSIS — F5105 Insomnia due to other mental disorder: Secondary | ICD-10-CM

## 2024-06-21 MED ORDER — INGREZZA 80 MG PO CAPS: 80.0000 mg | ORAL_CAPSULE | Freq: Every day | ORAL | 5 refills | Status: AC

## 2024-06-21 MED ORDER — BUDESONIDE 0.5 MG/2ML IN SUSP: 0.5000 mg | Freq: Two times a day (BID) | RESPIRATORY_TRACT | 12 refills | Status: AC

## 2024-06-21 MED ORDER — IPRATROPIUM-ALBUTEROL 0.5-2.5 (3) MG/3ML IN SOLN
3.0000 mL | Freq: Once | RESPIRATORY_TRACT | Status: AC
  Administered 2024-06-21: 3 mL via RESPIRATORY_TRACT

## 2024-06-21 MED ORDER — IPRATROPIUM-ALBUTEROL 0.5-2.5 (3) MG/3ML IN SOLN: 3.0000 mL | Freq: Three times a day (TID) | RESPIRATORY_TRACT | 12 refills | Status: AC | PRN

## 2024-06-21 MED ORDER — MIRTAZAPINE 7.5 MG PO TABS: 3.7500 mg | ORAL_TABLET | Freq: Every day | ORAL | 1 refills | Status: AC

## 2024-06-21 NOTE — Progress Notes (Signed)
 @Patient  ID: Judy [name], female    DOB: 1946-01-09, 78 y.o.   MRN: [id number]  Chief Complaint  Patient presents with   Shortness of Breath    D.O.E- worsening     Referring provider: Georgina Speaks, FNP  HPI:   78 y.o. woman whom we are seeing for evaluation of dyspnea on exertion.  Most recent PCP note reviewed.  History difficult to obtain via patient.  Returns to clinic overdue for follow-up.  At last visit she was not using inhalers correctly.  Switched everything and nebulizers.  Multiple telephone encounters reviewed.  We sent this to multiple different places.  Budesonide  and DuoNebs.  She has sensation of throat fullness cough.  Referred to ENT.  This was not scheduled.  She is doing DuoNebs.  Budesonide  was just prescribed at last visit.  Sent to multiple pharmacies per review of telephone encounters.  She never received this.  Not using.  We discussed increasing activity, pulmonary rehab,, repeating workup already done to date that is relatively reassuring.  HPI initial visit: Difficulty a bit difficult to obtain from patient.  Unclear if just due to being hard of hearing or other cognitive issue.  History gathered from patient as well as brother, sister-in-law in the room.  State dyspnea on exertion over the last 5 to 6 months.  No clear inciting event.  Denies any respiratory illness etc.  No time of day when things are better or worse.  No position that make things better or worse.  No seasonal or environmental factors she can do the 5 to make things better or worse.  She was given a prescription for albuterol .  Says it helps sometimes.  Not all the time.  Symptoms seem pretty stable day-to-day.  Largely unchanged over time.  Most recent chest CT, in the midst of the symptoms, 07/2022 reviewed and interpreted as clear lungs bilaterally, stable less than 1 cm groundglass opacity/nodule in the right upper lobe.  She was seen by pulmonary in 2018.  PFTs difficult to  perform but no fixed obstruction.  Felt he related to deconditioning, habitus at the time.  Chest imaging clear at that time.  Remains clear.  Discussed at length with clear chest imaging unlikely to have significant pulmonary abnormality.  Asthma is possible does not show up well on imaging nor pulmonary function test.  Discussed role and rationale for empiric treatment as well as additional investigation which they expressed understanding and agreed to.   Questionaires / Pulmonary Flowsheets:   ACT:      No data to display          MMRC:     No data to display          Epworth:      No data to display          Tests:   FENO:  No results found for: NITRICOXIDE  PFT:    Latest Ref Rng & Units 01/28/2023    3:58 PM 04/15/2017    2:00 PM  PFT Results  FVC-Pre L 2.36  1.87   FVC-Predicted Pre % 68  63   FVC-Post L 2.41    FVC-Predicted Post % 69    Pre FEV1/FVC % % 78  81   Post FEV1/FCV % % 80    FEV1-Pre L 1.83  1.52   FEV1-Predicted Pre % 70  65   FEV1-Post L 1.93    DLCO uncorrected ml/min/mmHg 30.91    DLCO UNC% %  137    DLCO corrected ml/min/mmHg 30.91    DLCO COR %Predicted % 137    DLVA Predicted % 155    TLC L 4.64    TLC % Predicted % 80    RV % Predicted % 127    Personally reviewed, not fully interpretable, spirometry suggestive of moderate restriction versus air trapping, unable to do lung volumes once again, DLCO within normal limits, results improved compared to 2018  WALK:     06/09/2017    3:43 PM 04/15/2017    4:47 PM  SIX MIN WALK  Supplimental Oxygen during Test? (L/min) No   Tech Comments: slow to normal pace/leg pain and SOB//lmr normal pace/SOB//lmr    Imaging: Personally reviewed and as per EMR discussion this note No results found.  Lab Results: Personally reviewed CBC    Component Value Date/Time   WBC 4.7 05/08/2024 1222   WBC 7.4 04/08/2023 0445   RBC 4.52 05/08/2024 1222   RBC 3.94 04/08/2023 0445   HGB 14.2  05/08/2024 1222   HCT 44.6 05/08/2024 1222   PLT 258 05/08/2024 1222   MCV 99 (H) 05/08/2024 1222   MCH 31.4 05/08/2024 1222   MCH 31.0 04/08/2023 0445   MCHC 31.8 05/08/2024 1222   MCHC 32.3 04/08/2023 0445   RDW 12.7 05/08/2024 1222   LYMPHSABS 1.6 11/08/2023 1214   MONOABS 0.6 04/28/2022 0437   EOSABS 0.0 11/08/2023 1214   BASOSABS 0.0 11/08/2023 1214    BMET    Component Value Date/Time   NA 143 05/08/2024 1222   K 3.9 05/08/2024 1222   CL 102 05/08/2024 1222   CO2 26 05/08/2024 1222   GLUCOSE 90 05/08/2024 1222   GLUCOSE 85 04/08/2023 0445   BUN 13 05/08/2024 1222   CREATININE 1.03 (H) 05/08/2024 1222   CREATININE 1.16 (H) 03/21/2018 1515   CALCIUM  9.9 05/08/2024 1222   GFRNONAA >60 04/08/2023 0445   GFRNONAA 48 (L) 03/21/2018 1515   GFRAA >60 11/20/2019 0314   GFRAA 55 (L) 03/21/2018 1515    BNP    Component Value Date/Time   BNP 14.6 03/02/2022 1539   BNP 41.6 11/15/2019 1716    ProBNP    Component Value Date/Time   PROBNP 23.0 04/15/2017 1703    Specialty Problems       Pulmonary Problems   Allergic rhinitis   Qualifier: Diagnosis of  By: Adella MD, Almarie SCULL SNOMED Dx Update Oct 2024      Dyspnea on exertion   - Spirometry 04/15/2017  FEV1 1.78 (76%)  Ratio 78 s curvature p no rx   04/15/2017   Walked RA  2 laps @ 185 ft each stopped due to  Sob/ no desats/ nl pace 06/09/2017   Walked RA  2 laps @ 185 ft each stopped due to  Weak legs > sob/ slow pace, no desat         Solitary pulmonary nodule on lung CT   See ct 04/08/17 x 6 mm RUL/ 3 mm LUL        No Known Allergies  Immunization History  Administered Date(s) Administered   Fluad Quad(high Dose 65+) 06/28/2022   Fluad Trivalent(High Dose 65+) 07/04/2023, 06/21/2024   INFLUENZA, HIGH DOSE SEASONAL PF 07/24/2018, 07/02/2019   Influenza,inj,Quad PF,6+ Mos 12/11/2013, 09/12/2015, 07/20/2016, 08/10/2017   Influenza-Unspecified 08/04/2021   PFIZER(Purple Top)SARS-COV-2  Vaccination 12/24/2019, 01/15/2020, 07/14/2020, 10/09/2020   Pneumococcal Polysaccharide-23 12/11/2013, 11/08/2019   Zoster Recombinant(Shingrix ) 03/02/2022, 08/10/2022    Past  Medical History:  Diagnosis Date   Allergic rhinitis    Anemia    Anxiety    Arthritis    Bipolar 1 disorder (HCC)    Cancer (HCC)    Depression    Diverticulosis of colon (without mention of hemorrhage) 08/25/2011   Dr. Burnette   Dyspnea    Endometrial cancer [facility]) 04/06/2023   Generalized weakness 07/04/2023   Hearing loss in left ear    since birth   Hyperlipidemia    Hypertension    resolved   Insomnia    Obesity    Palpitation    Renal disorder    Tardive dyskinesia    Tremor    Vertigo     Tobacco History: Social History   Tobacco Use  Smoking Status Never  Smokeless Tobacco Never   Counseling given: Not Answered   Continue to not smoke  Outpatient Encounter Medications as of 06/21/2024  Medication Sig   acetaminophen  (TYLENOL ) 325 MG tablet Take 2 tablets (650 mg total) by mouth every 6 (six) hours as needed for mild pain (or Fever >/= 101).   albuterol  (VENTOLIN  HFA) 108 (90 Base) MCG/ACT inhaler Inhale 2 puffs into the lungs every 6 (six) hours as needed for wheezing or shortness of breath.   aspirin  EC 81 MG tablet Take 1 tablet (81 mg total) by mouth daily. Swallow whole.   atorvastatin  (LIPITOR) 10 MG tablet TAKE 1 TABLET BY MOUTH EVERY DAY   b complex vitamins capsule Take 1 capsule by mouth daily. With C   buPROPion  (WELLBUTRIN  XL) 150 MG 24 hr tablet TAKE 1 TABLET BY MOUTH EVERY DAY   cholecalciferol (VITAMIN D3) 25 MCG (1000 UNIT) tablet Take 1,000 Units by mouth daily.   INGREZZA  80 MG capsule Take 1 capsule (80 mg total) by mouth daily.   meclizine  (ANTIVERT ) 12.5 MG tablet Take 1 tablet (12.5 mg total) by mouth 3 (three) times daily as needed for dizziness.   mirtazapine  (REMERON ) 7.5 MG tablet Take 0.5 tablets (3.75 mg total) by mouth at bedtime.   Omega-3 Fatty Acids  (FISH OIL BURP-LESS PO) Take 700 mg by mouth daily.   omeprazole  (PRILOSEC) 20 MG capsule Take 1 capsule (20 mg total) by mouth 2 (two) times daily before a meal.   polyethylene glycol (MIRALAX  / GLYCOLAX ) 17 g packet Take 17 g by mouth daily.   [DISCONTINUED] budesonide  (PULMICORT ) 0.5 MG/2ML nebulizer solution Take 2 mLs (0.5 mg total) by nebulization 2 (two) times daily. DX:J45.40   [DISCONTINUED] ipratropium-albuterol  (DUONEB) 0.5-2.5 (3) MG/3ML SOLN 1 treatment in the morning, 1 treatment in the evenings, every 6 hours as needed in between for shortness of breath or wheezing DX: J45.40   budesonide  (PULMICORT ) 0.5 MG/2ML nebulizer solution Take 2 mLs (0.5 mg total) by nebulization 2 (two) times daily. DX:J45.40   ipratropium-albuterol  (DUONEB) 0.5-2.5 (3) MG/3ML SOLN Take 3 mLs by nebulization 3 (three) times daily as needed. 1 treatment in the morning, 1 treatment in the evenings, every 6 hours as needed in between for shortness of breath or wheezing DX: J45.40   metoprolol  succinate (TOPROL -XL) 25 MG 24 hr tablet Take 1 tablet (25 mg total) by mouth daily. Take with or immediately following a meal. (Patient not taking: Reported on 06/21/2024)   propranolol  (INDERAL ) 10 MG tablet TAKE 1 TABLET BY MOUTH THREE TIMES A DAY AS NEEDED (Patient not taking: Reported on 06/21/2024)   [EXPIRED] ipratropium-albuterol  (DUONEB) 0.5-2.5 (3) MG/3ML nebulizer solution 3 mL    No facility-administered encounter  medications on file as of 06/21/2024.     Review of Systems  Review of Systems  N/a Physical Exam  BP 126/62   Pulse 81   Temp 97.7 F (36.5 C)   Ht 5' 9 (1.753 m)   Wt 124 lb 12.8 oz (56.6 kg)   SpO2 96% Comment: RA  BMI 18.43 kg/m   Wt Readings from Last 5 Encounters:  06/21/24 124 lb 12.8 oz (56.6 kg)  05/08/24 127 lb (57.6 kg)  03/27/24 128 lb (58.1 kg)  12/28/23 130 lb 9.6 oz (59.2 kg)  11/08/23 130 lb 12.8 oz (59.3 kg)    BMI Readings from Last 5 Encounters:  06/21/24 18.43  kg/m  05/08/24 18.75 kg/m  03/27/24 18.90 kg/m  12/28/23 19.29 kg/m  11/08/23 19.32 kg/m     Physical Exam General: Sitting in chair, no acute distress Eyes: EOMI, no icterus Neck: Supple, no JVP Pulmonary: Clear, no work of breathing Cardiovascular: Warm, no edema Abdomen: Distended, bowel sounds present MSK: No synovitis, no joint effusion Neuro: Normal gait, no weakness, hard of hearing Psych: normal mood, full affect   Assessment & Plan:   Dyspnea on exertion: Present for a few months but review of records indicates seen in 2018 in pulmonary clinic for the same.  PFTs hard to complete.  But felt that time related to habitus, deconditioning.  Her chest imaging historically and recently are clear, unlikely be a pulmonary issue.  PFT suggestive of air trapping versus restriction, DLCO within normal limits so intraparenchymal restriction on unlikely and not seen on prior chest imaging.  Unable to perform lung volumes to differentiate the 2.  Most recent CT scan shows no sign of intrathoracic or parenchymal restriction.  Likely air trapping.  Encouraged to use DuoNebs to stay at minimum, up to 3 times a day.  Unable to use inhalers due to poor technique.  Add budesonide , prescribed last visit but she never picked it up.  Referral to pulmonary rehab with her underlying CT scan possible ILD.  It seems the biggest contributor is deconditioning and discoordinated breathing, shallow breathing.  Consider repeat TTE and CT scan in the future if not improving with pulmonary rehab.  Throat irritation/swelling/mucus sensation: With no mucus produced likely inflammation or swelling.  Had some swallowing issues in the past.  Suspect related.  But seems more noticeable over the last 2 months.  Tried budesonide  as above although did not really optimistic this will help much.  Did not respond to PPI.  Referred to ENT at last visit, this was not scheduled.   Return in about 3 months (around  09/20/2024) for f/u Dr. Annella.   Judy JONELLE Annella, MD 06/21/2024   I spent 41 minutes in the care of the patient including face-to-face visit, coordination of care, review of records.

## 2024-06-21 NOTE — Progress Notes (Signed)
 [facility]  Patient ID: Judy [name],  MRN: [id number]  PCP: Georgina Speaks, FNP  Date of Evaluation: 06/21/2024 Time spent:20 minutes  Chief Complaint:  Chief Complaint   Depression; Follow-up; Insomnia    HISTORY/CURRENT STATUS: HPI For routine med check. Accompanied by her sister-in-law.   She's doing well. Living alone and not eating healthy foods and that's concerning to her family. Pt gets Meals on Wheels which she eats daily for lunch. [name] feels like it's enough. Doesn't do much but watch tv.  Energy and motivation are fair.  No extreme sadness, tearfulness, or feelings of hopelessness.  Sleeps ok.  ADLs are limited to the absolute necessities.  Personal hygiene is normal.  No change in memory. No mania, delirium, AH/VH.  No SI/HI.  Denies dizziness, syncope, seizures, numbness, tingling, tremor, tics, unsteady gait, slurred speech, confusion. Denies muscle or joint pain, stiffness, or dystonia.  The Ingrezza  continues to help with mouth movements and she does not want to go off it.  Individual Medical History/ Review of Systems: Changes? :No       Past medications for mental health diagnoses include: Latuda , Seroquel, Prozac , Fetzima, Risperdal , gabapentin , Wellbutrin   Allergies: Patient has no known allergies.  Current Medications:  Current Outpatient Medications:    acetaminophen  (TYLENOL ) 325 MG tablet, Take 2 tablets (650 mg total) by mouth every 6 (six) hours as needed for mild pain (or Fever >/= 101)., Disp: , Rfl:    albuterol  (VENTOLIN  HFA) 108 (90 Base) MCG/ACT inhaler, Inhale 2 puffs into the lungs every 6 (six) hours as needed for wheezing or shortness of breath., Disp: 18 each, Rfl: 2   atorvastatin  (LIPITOR) 10 MG tablet, TAKE 1 TABLET BY MOUTH EVERY DAY, Disp: 90 tablet, Rfl: 1   buPROPion  (WELLBUTRIN  XL) 150 MG 24 hr tablet, TAKE 1 TABLET BY MOUTH EVERY DAY, Disp: 90 tablet, Rfl: 1   cholecalciferol (VITAMIN D3) 25 MCG (1000 UNIT) tablet,  Take 1,000 Units by mouth daily., Disp: , Rfl:    meclizine  (ANTIVERT ) 12.5 MG tablet, Take 1 tablet (12.5 mg total) by mouth 3 (three) times daily as needed for dizziness., Disp: 30 tablet, Rfl: 2   Omega-3 Fatty Acids (FISH OIL BURP-LESS PO), Take 700 mg by mouth daily., Disp: , Rfl:    omeprazole  (PRILOSEC) 20 MG capsule, Take 1 capsule (20 mg total) by mouth 2 (two) times daily before a meal., Disp: 14 capsule, Rfl: 0   polyethylene glycol (MIRALAX  / GLYCOLAX ) 17 g packet, Take 17 g by mouth daily., Disp: 30 each, Rfl: 2   aspirin  EC 81 MG tablet, Take 1 tablet (81 mg total) by mouth daily. Swallow whole., Disp: 90 tablet, Rfl: 3   b complex vitamins capsule, Take 1 capsule by mouth daily. With C, Disp: , Rfl:    budesonide  (PULMICORT ) 0.5 MG/2ML nebulizer solution, Take 2 mLs (0.5 mg total) by nebulization 2 (two) times daily. DX:J45.40, Disp: 120 mL, Rfl: 12   INGREZZA  80 MG capsule, Take 1 capsule (80 mg total) by mouth daily., Disp: 30 capsule, Rfl: 5   ipratropium-albuterol  (DUONEB) 0.5-2.5 (3) MG/3ML SOLN, Take 3 mLs by nebulization 3 (three) times daily as needed. 1 treatment in the morning, 1 treatment in the evenings, every 6 hours as needed in between for shortness of breath or wheezing DX: J45.40, Disp: 270 mL, Rfl: 12   metoprolol  succinate (TOPROL -XL) 25 MG 24 hr tablet, Take 1 tablet (25 mg total) by mouth daily. Take with or immediately following a  meal. (Patient not taking: Reported on 06/21/2024), Disp: 90 tablet, Rfl: 3   mirtazapine  (REMERON ) 7.5 MG tablet, Take 0.5 tablets (3.75 mg total) by mouth at bedtime., Disp: 45 tablet, Rfl: 1   propranolol  (INDERAL ) 10 MG tablet, TAKE 1 TABLET BY MOUTH THREE TIMES A DAY AS NEEDED (Patient not taking: Reported on 06/21/2024), Disp: 270 tablet, Rfl: 2 Medication Side Effects: see HPI  Family Medical/ Social History: Changes? no  MENTAL HEALTH EXAM:  There were no vitals taken for this visit.There is no height or weight on file to  calculate BMI.  General Appearance: Casual, Neat and Well Groomed  Eye Contact:  Good  Speech:  Clear and Coherent and Normal Rate  Volume:  Normal  Mood:  Euthymic  Affect:  Appropriate  Thought Process:  Goal Directed and Descriptions of Associations: Circumstantial  Orientation:  Full (Time, Place, and Person)  Thought Content: Logical   Suicidal Thoughts:  No  Homicidal Thoughts:  No  Memory:  fair  Judgement:  Fair  Insight:  Fair  Psychomotor Activity:   No abnormal mouth, extremity or truncal movements  Concentration:  Concentration: Good  Recall:  Fair  Fund of Knowledge: Good  Language: Good  Assets:  Financial Resources/Insurance Housing Transportation  ADL's:  Impaired  Cognition: WNL  Prognosis:  Fair   DIAGNOSES:    ICD-10-CM   1. Recurrent major depressive disorder, in partial remission  F33.41     2. Insomnia due to other mental disorder  F51.05    F99     3. Tardive dyskinesia  G24.01       Receiving Psychotherapy: No   RECOMMENDATIONS:  PDMP was reviewed.  Gabapentin  filled 09/21/2023.  Tramadol  given 03/22/2023. I provided approximately 20 minutes of face to face time during this encounter, including time spent before and after the visit in records review, medical decision making, counseling pertinent to today's visit, and charting.   Judy [name] is stable as far as her mood goes.  No changes need to be made. We discussed her lack of proper nutrition.  Ensure or boost may be beneficial.  Continue Wellbutrin  XL 150 mg, 1 q am.  Continue Ingrezza  80 mg daily. Continue mirtazapine  7.5 mg, 1/2-1 p.o. nightly as needed sleep. Continue multivitamin, B complex, vitamin D , fish oil. Return in 6 months.   Verneita Cooks, PA-C

## 2024-06-21 NOTE — Patient Instructions (Signed)
 Continue DuoNebs up to 3 times a day, morning and evening and third dose midday if needed for shortness of breath  Use budesonide  nebulized twice a day, I am sorry this never got to you last time.  I sent it to the [facility]  I ordered pulmonary rehab to work on breathing exercises and increasing activity see if this helps as well  Return to clinic in 3 months or sooner as needed with Dr. Annella

## 2024-06-22 ENCOUNTER — Encounter (HOSPITAL_COMMUNITY): Payer: Self-pay

## 2024-06-25 ENCOUNTER — Telehealth (HOSPITAL_COMMUNITY): Payer: Self-pay

## 2024-06-25 NOTE — Telephone Encounter (Signed)
 Pt returned phone call in regards to pulmonary rehab, she stated she is not interested right now in the program and if anything changes she will call back.   Closed referral.

## 2024-06-26 DIAGNOSIS — G4700 Insomnia, unspecified: Secondary | ICD-10-CM | POA: Diagnosis not present

## 2024-06-26 DIAGNOSIS — N289 Disorder of kidney and ureter, unspecified: Secondary | ICD-10-CM | POA: Diagnosis not present

## 2024-06-26 DIAGNOSIS — I700 Atherosclerosis of aorta: Secondary | ICD-10-CM | POA: Diagnosis not present

## 2024-06-26 DIAGNOSIS — Z7982 Long term (current) use of aspirin: Secondary | ICD-10-CM | POA: Diagnosis not present

## 2024-06-26 DIAGNOSIS — Z6820 Body mass index (BMI) 20.0-20.9, adult: Secondary | ICD-10-CM | POA: Diagnosis not present

## 2024-06-26 DIAGNOSIS — G2401 Drug induced subacute dyskinesia: Secondary | ICD-10-CM | POA: Diagnosis not present

## 2024-06-26 DIAGNOSIS — R1310 Dysphagia, unspecified: Secondary | ICD-10-CM | POA: Diagnosis not present

## 2024-06-26 DIAGNOSIS — D649 Anemia, unspecified: Secondary | ICD-10-CM | POA: Diagnosis not present

## 2024-06-26 DIAGNOSIS — E782 Mixed hyperlipidemia: Secondary | ICD-10-CM | POA: Diagnosis not present

## 2024-06-26 DIAGNOSIS — Z5982 Transportation insecurity: Secondary | ICD-10-CM | POA: Diagnosis not present

## 2024-06-26 DIAGNOSIS — K219 Gastro-esophageal reflux disease without esophagitis: Secondary | ICD-10-CM | POA: Diagnosis not present

## 2024-06-26 DIAGNOSIS — M1990 Unspecified osteoarthritis, unspecified site: Secondary | ICD-10-CM | POA: Diagnosis not present

## 2024-06-26 DIAGNOSIS — Z602 Problems related to living alone: Secondary | ICD-10-CM | POA: Diagnosis not present

## 2024-06-26 DIAGNOSIS — J309 Allergic rhinitis, unspecified: Secondary | ICD-10-CM | POA: Diagnosis not present

## 2024-06-26 DIAGNOSIS — Z9181 History of falling: Secondary | ICD-10-CM | POA: Diagnosis not present

## 2024-06-26 DIAGNOSIS — I10 Essential (primary) hypertension: Secondary | ICD-10-CM | POA: Diagnosis not present

## 2024-06-26 DIAGNOSIS — Z86711 Personal history of pulmonary embolism: Secondary | ICD-10-CM | POA: Diagnosis not present

## 2024-06-26 DIAGNOSIS — E46 Unspecified protein-calorie malnutrition: Secondary | ICD-10-CM | POA: Diagnosis not present

## 2024-06-26 DIAGNOSIS — H9192 Unspecified hearing loss, left ear: Secondary | ICD-10-CM | POA: Diagnosis not present

## 2024-06-26 DIAGNOSIS — R7303 Prediabetes: Secondary | ICD-10-CM | POA: Diagnosis not present

## 2024-06-26 DIAGNOSIS — G8191 Hemiplegia, unspecified affecting right dominant side: Secondary | ICD-10-CM | POA: Diagnosis not present

## 2024-07-04 DIAGNOSIS — Z9181 History of falling: Secondary | ICD-10-CM | POA: Diagnosis not present

## 2024-07-04 DIAGNOSIS — J309 Allergic rhinitis, unspecified: Secondary | ICD-10-CM | POA: Diagnosis not present

## 2024-07-04 DIAGNOSIS — Z6820 Body mass index (BMI) 20.0-20.9, adult: Secondary | ICD-10-CM | POA: Diagnosis not present

## 2024-07-04 DIAGNOSIS — R7303 Prediabetes: Secondary | ICD-10-CM | POA: Diagnosis not present

## 2024-07-04 DIAGNOSIS — D649 Anemia, unspecified: Secondary | ICD-10-CM | POA: Diagnosis not present

## 2024-07-04 DIAGNOSIS — R1310 Dysphagia, unspecified: Secondary | ICD-10-CM | POA: Diagnosis not present

## 2024-07-04 DIAGNOSIS — E46 Unspecified protein-calorie malnutrition: Secondary | ICD-10-CM | POA: Diagnosis not present

## 2024-07-04 DIAGNOSIS — Z5982 Transportation insecurity: Secondary | ICD-10-CM | POA: Diagnosis not present

## 2024-07-04 DIAGNOSIS — G2401 Drug induced subacute dyskinesia: Secondary | ICD-10-CM | POA: Diagnosis not present

## 2024-07-04 DIAGNOSIS — K219 Gastro-esophageal reflux disease without esophagitis: Secondary | ICD-10-CM | POA: Diagnosis not present

## 2024-07-04 DIAGNOSIS — Z86711 Personal history of pulmonary embolism: Secondary | ICD-10-CM | POA: Diagnosis not present

## 2024-07-04 DIAGNOSIS — G8191 Hemiplegia, unspecified affecting right dominant side: Secondary | ICD-10-CM | POA: Diagnosis not present

## 2024-07-04 DIAGNOSIS — I700 Atherosclerosis of aorta: Secondary | ICD-10-CM | POA: Diagnosis not present

## 2024-07-04 DIAGNOSIS — G4700 Insomnia, unspecified: Secondary | ICD-10-CM | POA: Diagnosis not present

## 2024-07-04 DIAGNOSIS — I10 Essential (primary) hypertension: Secondary | ICD-10-CM | POA: Diagnosis not present

## 2024-07-04 DIAGNOSIS — Z602 Problems related to living alone: Secondary | ICD-10-CM | POA: Diagnosis not present

## 2024-07-04 DIAGNOSIS — E782 Mixed hyperlipidemia: Secondary | ICD-10-CM | POA: Diagnosis not present

## 2024-07-04 DIAGNOSIS — M1990 Unspecified osteoarthritis, unspecified site: Secondary | ICD-10-CM | POA: Diagnosis not present

## 2024-07-04 DIAGNOSIS — H9192 Unspecified hearing loss, left ear: Secondary | ICD-10-CM | POA: Diagnosis not present

## 2024-07-04 DIAGNOSIS — N289 Disorder of kidney and ureter, unspecified: Secondary | ICD-10-CM | POA: Diagnosis not present

## 2024-07-04 DIAGNOSIS — Z7982 Long term (current) use of aspirin: Secondary | ICD-10-CM | POA: Diagnosis not present

## 2024-07-10 DIAGNOSIS — G8191 Hemiplegia, unspecified affecting right dominant side: Secondary | ICD-10-CM | POA: Diagnosis not present

## 2024-07-10 DIAGNOSIS — G4700 Insomnia, unspecified: Secondary | ICD-10-CM | POA: Diagnosis not present

## 2024-07-10 DIAGNOSIS — J309 Allergic rhinitis, unspecified: Secondary | ICD-10-CM | POA: Diagnosis not present

## 2024-07-10 DIAGNOSIS — R7303 Prediabetes: Secondary | ICD-10-CM | POA: Diagnosis not present

## 2024-07-10 DIAGNOSIS — Z602 Problems related to living alone: Secondary | ICD-10-CM | POA: Diagnosis not present

## 2024-07-10 DIAGNOSIS — G2401 Drug induced subacute dyskinesia: Secondary | ICD-10-CM | POA: Diagnosis not present

## 2024-07-10 DIAGNOSIS — E46 Unspecified protein-calorie malnutrition: Secondary | ICD-10-CM | POA: Diagnosis not present

## 2024-07-10 DIAGNOSIS — I10 Essential (primary) hypertension: Secondary | ICD-10-CM | POA: Diagnosis not present

## 2024-07-10 DIAGNOSIS — I700 Atherosclerosis of aorta: Secondary | ICD-10-CM | POA: Diagnosis not present

## 2024-07-10 DIAGNOSIS — K219 Gastro-esophageal reflux disease without esophagitis: Secondary | ICD-10-CM | POA: Diagnosis not present

## 2024-07-10 DIAGNOSIS — N289 Disorder of kidney and ureter, unspecified: Secondary | ICD-10-CM | POA: Diagnosis not present

## 2024-07-10 DIAGNOSIS — R1310 Dysphagia, unspecified: Secondary | ICD-10-CM | POA: Diagnosis not present

## 2024-07-10 DIAGNOSIS — Z9181 History of falling: Secondary | ICD-10-CM | POA: Diagnosis not present

## 2024-07-10 DIAGNOSIS — H9192 Unspecified hearing loss, left ear: Secondary | ICD-10-CM | POA: Diagnosis not present

## 2024-07-10 DIAGNOSIS — E782 Mixed hyperlipidemia: Secondary | ICD-10-CM | POA: Diagnosis not present

## 2024-07-10 DIAGNOSIS — Z5982 Transportation insecurity: Secondary | ICD-10-CM | POA: Diagnosis not present

## 2024-07-10 DIAGNOSIS — D649 Anemia, unspecified: Secondary | ICD-10-CM | POA: Diagnosis not present

## 2024-07-10 DIAGNOSIS — M1990 Unspecified osteoarthritis, unspecified site: Secondary | ICD-10-CM | POA: Diagnosis not present

## 2024-07-10 DIAGNOSIS — Z6820 Body mass index (BMI) 20.0-20.9, adult: Secondary | ICD-10-CM | POA: Diagnosis not present

## 2024-07-10 DIAGNOSIS — Z86711 Personal history of pulmonary embolism: Secondary | ICD-10-CM | POA: Diagnosis not present

## 2024-07-10 DIAGNOSIS — Z7982 Long term (current) use of aspirin: Secondary | ICD-10-CM | POA: Diagnosis not present

## 2024-07-20 ENCOUNTER — Telehealth (HOSPITAL_COMMUNITY): Payer: Self-pay

## 2024-07-20 NOTE — Telephone Encounter (Signed)
 Pt insurance is active and benefits verified through [facility] Medicare Co-pay $15, DED 0/0 met, out of pocket $3,900/$338.24 met, co-insurance 0%. no pre-authorization required, Edwin/[employer] 07/20/24@11 :36, REF# [id number]

## 2024-07-20 NOTE — Telephone Encounter (Signed)
 Patient's brother called and stated that pt was interested in participating in the Pulmonary Rehab Program. Patient will come in for orientation on 10/15@9am  and will attend the 1:15pm exercise class.   Provided pt information over the phone

## 2024-07-25 ENCOUNTER — Telehealth (HOSPITAL_COMMUNITY): Payer: Self-pay

## 2024-07-25 ENCOUNTER — Encounter (HOSPITAL_COMMUNITY)
Admission: RE | Admit: 2024-07-25 | Discharge: 2024-07-25 | Disposition: A | Discharge status: Home / Self Care | Source: Ambulatory Visit | Attending: Pulmonary Disease | Admitting: Pulmonary Disease

## 2024-07-25 ENCOUNTER — Encounter (HOSPITAL_COMMUNITY): Payer: Self-pay

## 2024-07-25 VITALS — BP 130/70 | HR 73 | Ht 69.0 in | Wt 125.7 lb

## 2024-07-25 DIAGNOSIS — J45909 Unspecified asthma, uncomplicated: Secondary | ICD-10-CM | POA: Diagnosis present

## 2024-07-25 NOTE — Telephone Encounter (Signed)
 Called pt's brother, Judy [name]. There was some confusion about when Judy [name]'s appt was. Judy [name] thought her appt for today was at 1:15. I mentioned to him that her exercise appts were at 1:15 and today's appt was at 9 am. I asked them to bring Judy [name] as soon as they can. He agreed to bring her at 11 am.

## 2024-07-25 NOTE — Progress Notes (Signed)
 Pulmonary Individual Treatment Plan  Patient Details  Name: Judy [name] MRN: [id number] Date of Birth: July 04, 1946 Referring Provider:   Conrad Ports Pulmonary Rehab Walk Test from 07/25/2024 in [facility]  Referring Provider Dr. Annella    Initial Encounter Date:  Flowsheet Row Pulmonary Rehab Walk Test from 07/25/2024 in [facility]  Date 07/25/24    Visit Diagnosis: Asthma, unspecified asthma severity, unspecified whether complicated, unspecified whether persistent  Patient's Home Medications on Admission:   Current Outpatient Medications:    acetaminophen  (TYLENOL ) 325 MG tablet, Take 2 tablets (650 mg total) by mouth every 6 (six) hours as needed for mild pain (or Fever >/= 101)., Disp: , Rfl:    albuterol  (VENTOLIN  HFA) 108 (90 Base) MCG/ACT inhaler, Inhale 2 puffs into the lungs every 6 (six) hours as needed for wheezing or shortness of breath., Disp: 18 each, Rfl: 2   aspirin  EC 81 MG tablet, Take 1 tablet (81 mg total) by mouth daily. Swallow whole., Disp: 90 tablet, Rfl: 3   b complex vitamins capsule, Take 1 capsule by mouth daily. With C, Disp: , Rfl:    budesonide  (PULMICORT ) 0.5 MG/2ML nebulizer solution, Take 2 mLs (0.5 mg total) by nebulization 2 (two) times daily. DX:J45.40, Disp: 120 mL, Rfl: 12   buPROPion  (WELLBUTRIN  XL) 150 MG 24 hr tablet, TAKE 1 TABLET BY MOUTH EVERY DAY, Disp: 90 tablet, Rfl: 1   cholecalciferol (VITAMIN D3) 25 MCG (1000 UNIT) tablet, Take 1,000 Units by mouth daily., Disp: , Rfl:    INGREZZA  80 MG capsule, Take 1 capsule (80 mg total) by mouth daily., Disp: 30 capsule, Rfl: 5   ipratropium-albuterol  (DUONEB) 0.5-2.5 (3) MG/3ML SOLN, Take 3 mLs by nebulization 3 (three) times daily as needed. 1 treatment in the morning, 1 treatment in the evenings, every 6 hours as needed in between for shortness of breath or wheezing DX:  J45.40, Disp: 270 mL, Rfl: 12   meclizine  (ANTIVERT ) 12.5 MG tablet, Take 1 tablet (12.5 mg total) by mouth 3 (three) times daily as needed for dizziness., Disp: 30 tablet, Rfl: 2   mirtazapine  (REMERON ) 7.5 MG tablet, Take 0.5 tablets (3.75 mg total) by mouth at bedtime., Disp: 45 tablet, Rfl: 1   Omega-3 Fatty Acids (FISH OIL BURP-LESS PO), Take 700 mg by mouth daily., Disp: , Rfl:    omeprazole  (PRILOSEC) 20 MG capsule, Take 1 capsule (20 mg total) by mouth 2 (two) times daily before a meal., Disp: 14 capsule, Rfl: 0   polyethylene glycol (MIRALAX  / GLYCOLAX ) 17 g packet, Take 17 g by mouth daily., Disp: 30 each, Rfl: 2   atorvastatin  (LIPITOR) 10 MG tablet, TAKE 1 TABLET BY MOUTH EVERY DAY (Patient not taking: Reported on 07/25/2024), Disp: 90 tablet, Rfl: 1   metoprolol  succinate (TOPROL -XL) 25 MG 24 hr tablet, Take 1 tablet (25 mg total) by mouth daily. Take with or immediately following a meal. (Patient not taking: Reported on 07/25/2024), Disp: 90 tablet, Rfl: 3   propranolol  (INDERAL ) 10 MG tablet, TAKE 1 TABLET BY MOUTH THREE TIMES A DAY AS NEEDED (Patient not taking: Reported on 07/25/2024), Disp: 270 tablet, Rfl: 2  Past Medical History: Past Medical History:  Diagnosis Date   Allergic rhinitis    Anemia    Anxiety    Arthritis    Bipolar 1 disorder (HCC)    Cancer (HCC)    Depression  Diverticulosis of colon (without mention of hemorrhage) 08/25/2011   Dr. Burnette   Dyspnea    Endometrial cancer [facility]) 04/06/2023   Generalized weakness 07/04/2023   Hearing loss in left ear    since birth   Hyperlipidemia    Hypertension    resolved   Insomnia    Obesity    Palpitation    Renal disorder    Tardive dyskinesia    Tremor    Vertigo     Tobacco Use: Social History   Tobacco Use  Smoking Status Never  Smokeless Tobacco Never    Labs: Review Flowsheet  More data exists      Latest Ref Rng & Units 10/29/2021 03/02/2022 11/04/2022 11/08/2023 05/08/2024  Labs for ITP  Cardiac and Pulmonary Rehab  Cholestrol 100 - 199 mg/dL 829  843  848  828  786   LDL (calc) 0 - 99 mg/dL 73  71  63  83  873   HDL-C >39 mg/dL 75  70  72  72  74   Trlycerides 0 - 149 mg/dL 870  77  86  86  76   Hemoglobin A1c 4.8 - 5.6 % 5.3  5.5  5.6  5.5  -    Capillary Blood Glucose: Lab Results  Component Value Date   GLUCAP 108 (H) 04/20/2022   GLUCAP 79 11/08/2019   GLUCAP 96 12/10/2013   GLUCAP 98 08/25/2011     Pulmonary Assessment Scores:  Pulmonary Assessment Scores     Row Name 07/25/24 1303         ADL UCSD   ADL Phase Entry     SOB Score total 69       CAT Score   CAT Score 26       mMRC Score   mMRC Score 4       UCSD: Self-administered rating of dyspnea associated with activities of daily living (ADLs) 6-point scale (0 = not at all to 5 = maximal or unable to do because of breathlessness)  Scoring Scores range from 0 to 120.  Minimally important difference is 5 units  CAT: CAT can identify the health impairment of COPD patients and is better correlated with disease progression.  CAT has a scoring range of zero to 40. The CAT score is classified into four groups of low (less than 10), medium (10 - 20), high (21-30) and very high (31-40) based on the impact level of disease on health status. A CAT score over 10 suggests significant symptoms.  A worsening CAT score could be explained by an exacerbation, poor medication adherence, poor inhaler technique, or progression of COPD or comorbid conditions.  CAT MCID is 2 points  mMRC: mMRC (Modified Medical Research Council) Dyspnea Scale is used to assess the degree of baseline functional disability in patients of respiratory disease due to dyspnea. No minimal important difference is established. A decrease in score of 1 point or greater is considered a positive change.   Pulmonary Function Assessment:  Pulmonary Function Assessment - 07/25/24 1302       Breath   Bilateral Breath Sounds Clear     Shortness of Breath Yes;Limiting activity          Exercise Target Goals: Exercise Program Goal: Individual exercise prescription set using results from initial 6 min walk test and THRR while considering  patient's activity barriers and safety.   Exercise Prescription Goal: Initial exercise prescription builds to 30-45 minutes a day of aerobic activity, 2-3 days  per week.  Home exercise guidelines will be given to patient during program as part of exercise prescription that the participant will acknowledge.  Activity Barriers & Risk Stratification:  Activity Barriers & Cardiac Risk Stratification - 07/25/24 1301       Activity Barriers & Cardiac Risk Stratification   Activity Barriers Deconditioning;Muscular Weakness;Shortness of Breath;Back Problems;History of Falls;Balance Concerns          6 Minute Walk:  6 Minute Walk     Row Name 07/25/24 1234         6 Minute Walk   Phase Initial     Distance 600 feet     Walk Time 6 minutes     # of Rest Breaks 1     MPH 1.14     METS 1.96     RPE 13     Perceived Dyspnea  1     VO2 Peak 6.86     Symptoms No     Resting HR 73 bpm     Resting BP 130/70     Resting Oxygen Saturation  98 %     Exercise Oxygen Saturation  during 6 min walk 95 %     Max Ex. HR 107 bpm     Max Ex. BP 132/80     2 Minute Post BP 122/80       Interval HR   1 Minute HR 102     2 Minute HR 106     3 Minute HR 107     4 Minute HR 107     5 Minute HR 99     6 Minute HR 76     2 Minute Post HR 68     Interval Heart Rate? Yes       Interval Oxygen   Interval Oxygen? Yes     Baseline Oxygen Saturation % 98 %     1 Minute Oxygen Saturation % 95 %     1 Minute Liters of Oxygen 0 L     2 Minute Oxygen Saturation % 96 %     2 Minute Liters of Oxygen 0 L     3 Minute Oxygen Saturation % 98 %     3 Minute Liters of Oxygen 0 L     4 Minute Oxygen Saturation % 99 %     4 Minute Liters of Oxygen 0 L     5 Minute Oxygen Saturation % 99 %     5  Minute Liters of Oxygen 0 L     6 Minute Oxygen Saturation % 99 %     6 Minute Liters of Oxygen 0 L     2 Minute Post Oxygen Saturation % 98 %     2 Minute Post Liters of Oxygen 0 L        Oxygen Initial Assessment:  Oxygen Initial Assessment - 07/25/24 1302       Home Oxygen   Home Oxygen Device None    Sleep Oxygen Prescription None    Home Exercise Oxygen Prescription None    Home Resting Oxygen Prescription None      Initial 6 min Walk   Oxygen Used None      Program Oxygen Prescription   Program Oxygen Prescription None      Intervention   Short Term Goals To learn and understand importance of maintaining oxygen saturations>88%;To learn and demonstrate proper use of respiratory medications;To learn and demonstrate proper pursed lip breathing techniques or  other breathing techniques. ;To learn and understand importance of monitoring SPO2 with pulse oximeter and demonstrate accurate use of the pulse oximeter.;To learn and exhibit compliance with exercise, home and travel O2 prescription    Long  Term Goals Exhibits compliance with exercise, home  and travel O2 prescription;Maintenance of O2 saturations>88%;Compliance with respiratory medication;Demonstrates proper use of MDI's;Exhibits proper breathing techniques, such as pursed lip breathing or other method taught during program session;Verbalizes importance of monitoring SPO2 with pulse oximeter and return demonstration          Oxygen Re-Evaluation:   Oxygen Discharge (Final Oxygen Re-Evaluation):   Initial Exercise Prescription:  Initial Exercise Prescription - 07/25/24 1200       Date of Initial Exercise RX and Referring Provider   Date 07/25/24    Referring Provider Dr. Annella    Expected Discharge Date 10/30/24      NuStep   Level 1    SPM 50    Minutes 30    METs 1.5      Prescription Details   Frequency (times per week) 2    Duration Progress to 30 minutes of continuous aerobic without  signs/symptoms of physical distress      Intensity   THRR 40-80% of Max Heartrate 57-114    Ratings of Perceived Exertion 11-13    Perceived Dyspnea 0-4      Progression   Progression Continue to progress workloads to maintain intensity without signs/symptoms of physical distress.      Resistance Training   Training Prescription Yes    Weight yellow bands    Reps 10-15          Perform Capillary Blood Glucose checks as needed.  Exercise Prescription Changes:   Exercise Comments:   Exercise Goals and Review:   Exercise Goals     Row Name 07/25/24 1302             Exercise Goals   Increase Physical Activity Yes       Intervention Provide advice, education, support and counseling about physical activity/exercise needs.;Develop an individualized exercise prescription for aerobic and resistive training based on initial evaluation findings, risk stratification, comorbidities and participant's personal goals.       Expected Outcomes Short Term: Attend rehab on a regular basis to increase amount of physical activity.;Long Term: Exercising regularly at least 3-5 days a week.;Long Term: Add in home exercise to make exercise part of routine and to increase amount of physical activity.       Increase Strength and Stamina Yes       Intervention Provide advice, education, support and counseling about physical activity/exercise needs.;Develop an individualized exercise prescription for aerobic and resistive training based on initial evaluation findings, risk stratification, comorbidities and participant's personal goals.       Expected Outcomes Short Term: Increase workloads from initial exercise prescription for resistance, speed, and METs.;Short Term: Perform resistance training exercises routinely during rehab and add in resistance training at home;Long Term: Improve cardiorespiratory fitness, muscular endurance and strength as measured by increased METs and functional capacity ( )        Able to understand and use rate of perceived exertion (RPE) scale Yes       Intervention Provide education and explanation on how to use RPE scale       Expected Outcomes Short Term: Able to use RPE daily in rehab to express subjective intensity level;Long Term:  Able to use RPE to guide intensity level when exercising independently  Able to understand and use Dyspnea scale Yes       Intervention Provide education and explanation on how to use Dyspnea scale       Expected Outcomes Short Term: Able to use Dyspnea scale daily in rehab to express subjective sense of shortness of breath during exertion;Long Term: Able to use Dyspnea scale to guide intensity level when exercising independently       Knowledge and understanding of Target Heart Rate Range (THRR) Yes       Intervention Provide education and explanation of THRR including how the numbers were predicted and where they are located for reference       Expected Outcomes Short Term: Able to state/look up THRR;Short Term: Able to use daily as guideline for intensity in rehab;Long Term: Able to use THRR to govern intensity when exercising independently       Understanding of Exercise Prescription Yes       Intervention Provide education, explanation, and written materials on patient's individual exercise prescription       Expected Outcomes Short Term: Able to explain program exercise prescription;Long Term: Able to explain home exercise prescription to exercise independently          Exercise Goals Re-Evaluation :   Discharge Exercise Prescription (Final Exercise Prescription Changes):   Nutrition:  Target Goals: Understanding of nutrition guidelines, daily intake of sodium 1500mg , cholesterol 200mg , calories 30% from fat and 7% or less from saturated fats, daily to have 5 or more servings of fruits and vegetables.  Biometrics:    Nutrition Therapy Plan and Nutrition Goals:   Nutrition Assessments:  MEDIFICTS Score  Key: >=70 Need to make dietary changes  40-70 Heart Healthy Diet <= 40 Therapeutic Level Cholesterol Diet   Picture Your Plate Scores: <59 Unhealthy dietary pattern with much room for improvement. 41-50 Dietary pattern unlikely to meet recommendations for good health and room for improvement. 51-60 More healthful dietary pattern, with some room for improvement.  >60 Healthy dietary pattern, although there may be some specific behaviors that could be improved.    Nutrition Goals Re-Evaluation:   Nutrition Goals Discharge (Final Nutrition Goals Re-Evaluation):   Psychosocial: Target Goals: Acknowledge presence or absence of significant depression and/or stress, maximize coping skills, provide positive support system. Participant is able to verbalize types and ability to use techniques and skills needed for reducing stress and depression.  Initial Review & Psychosocial Screening:  Initial Psych Review & Screening - 07/25/24 1303       Initial Review   Current issues with History of Depression;Current Psychotropic Meds      Family Dynamics   Good Support System? Yes    Comments Pt has mild cognitive impairment. Her brother, Victory, and his wife Merlynn assist pt. She lives alone, no longer drives. Sees therapist regulary for medication management, hx of bipolar, depression      Barriers   Psychosocial barriers to participate in program There are no identifiable barriers or psychosocial needs.      Screening Interventions   Interventions Encouraged to exercise    Expected Outcomes Short Term goal: Utilizing psychosocial counselor, staff and physician to assist with identification of specific Stressors or current issues interfering with healing process. Setting desired goal for each stressor or current issue identified.;Long Term Goal: Stressors or current issues are controlled or eliminated.;Short Term goal: Identification and review with participant of any Quality of Life or Depression  concerns found by scoring the questionnaire.;Long Term goal: The participant improves quality of Life  and PHQ9 Scores as seen by post scores and/or verbalization of changes          Quality of Life Scores:  Scores of 19 and below usually indicate a poorer quality of life in these areas.  A difference of  2-3 points is a clinically meaningful difference.  A difference of 2-3 points in the total score of the Quality of Life Index has been associated with significant improvement in overall quality of life, self-image, physical symptoms, and general health in studies assessing change in quality of life.  PHQ-9: Review Flowsheet  More data exists      12/28/2023 11/08/2023 07/04/2023 12/16/2022 11/04/2022  Depression screen PHQ 2/9  Decreased Interest 0 2 3 0 0  Down, Depressed, Hopeless 1 2 3  0 2  PHQ - 2 Score 1 4 6  0 2  Altered sleeping 1 0 2 0 0  Tired, decreased energy 1 3 3 2 3   Change in appetite 0 3 3 0 0  Feeling bad or failure about yourself  0 1 3 0 1  Trouble concentrating 0 3 1 0 3  Moving slowly or fidgety/restless 0 0 2 0 3  Suicidal thoughts 0 0 0 0 0  PHQ-9 Score 3 14 20 2 12   Difficult doing work/chores Not difficult at all Somewhat difficult - - -   Interpretation of Total Score  Total Score Depression Severity:  1-4 = Minimal depression, 5-9 = Mild depression, 10-14 = Moderate depression, 15-19 = Moderately severe depression, 20-27 = Severe depression   Psychosocial Evaluation and Intervention:  Psychosocial Evaluation - 07/25/24 1305       Psychosocial Evaluation & Interventions   Interventions Encouraged to exercise with the program and follow exercise prescription    Comments Pt has mild cognitive impairment. Her brother, Victory, and his wife Merlynn assist pt. She lives alone, no longer drives. Sees therapist regulary for medication management, hx of bipolar, depression    Expected Outcomes For Hadiyah to participate in PR free of any psy/soc barriers or concerns     Continue Psychosocial Services  Follow up required by staff          Psychosocial Re-Evaluation:   Psychosocial Discharge (Final Psychosocial Re-Evaluation):   Education: Education Goals: Education classes will be provided on a weekly basis, covering required topics. Participant will state understanding/return demonstration of topics presented.  Learning Barriers/Preferences:  Learning Barriers/Preferences - 07/25/24 1306       Learning Barriers/Preferences   Learning Barriers Inability to learn new things;Exercise Concerns;Reading    Learning Preferences Individual Instruction;Verbal Instruction;Written Material          Education Topics: Know Your Numbers Group instruction that is supported by a PowerPoint presentation. Instructor discusses importance of knowing and understanding resting, exercise, and post-exercise oxygen saturation, heart rate, and blood pressure. Oxygen saturation, heart rate, blood pressure, rating of perceived exertion, and dyspnea are reviewed along with a normal range for these values.    Exercise for the Pulmonary Patient Group instruction that is supported by a PowerPoint presentation. Instructor discusses benefits of exercise, core components of exercise, frequency, duration, and intensity of an exercise routine, importance of utilizing pulse oximetry during exercise, safety while exercising, and options of places to exercise outside of rehab.    MET Level  Group instruction provided by PowerPoint, verbal discussion, and written material to support subject matter. Instructor reviews what METs are and how to increase METs.    Pulmonary Medications Verbally interactive group education provided by instructor  with focus on inhaled medications and proper administration.   Anatomy and Physiology of the Respiratory System Group instruction provided by PowerPoint, verbal discussion, and written material to support subject matter. Instructor reviews  respiratory cycle and anatomical components of the respiratory system and their functions. Instructor also reviews differences in obstructive and restrictive respiratory diseases with examples of each.    Oxygen Safety Group instruction provided by PowerPoint, verbal discussion, and written material to support subject matter. There is an overview of "What is Oxygen" and "Why do we need it".  Instructor also reviews how to create a safe environment for oxygen use, the importance of using oxygen as prescribed, and the risks of noncompliance. There is a brief discussion on traveling with oxygen and resources the patient may utilize.   Oxygen Use Group instruction provided by PowerPoint, verbal discussion, and written material to discuss how supplemental oxygen is prescribed and different types of oxygen supply systems. Resources for more information are provided.    Breathing Techniques Group instruction that is supported by demonstration and informational handouts. Instructor discusses the benefits of pursed lip and diaphragmatic breathing and detailed demonstration on how to perform both.     Risk Factor Reduction Group instruction that is supported by a PowerPoint presentation. Instructor discusses the definition of a risk factor, different risk factors for pulmonary disease, and how the heart and lungs work together.   Pulmonary Diseases Group instruction provided by PowerPoint, verbal discussion, and written material to support subject matter. Instructor gives an overview of the different type of pulmonary diseases. There is also a discussion on risk factors and symptoms as well as ways to manage the diseases.   Stress and Energy Conservation Group instruction provided by PowerPoint, verbal discussion, and written material to support subject matter. Instructor gives an overview of stress and the impact it can have on the body. Instructor also reviews ways to reduce stress. There is also a  discussion on energy conservation and ways to conserve energy throughout the day.   Warning Signs and Symptoms Group instruction provided by PowerPoint, verbal discussion, and written material to support subject matter. Instructor reviews warning signs and symptoms of stroke, heart attack, cold and flu. Instructor also reviews ways to prevent the spread of infection.   Other Education Group or individual verbal, written, or video instructions that support the educational goals of the pulmonary rehab program.    Knowledge Questionnaire Score:  Knowledge Questionnaire Score - 07/25/24 1306       Knowledge Questionnaire Score   Pre Score --   unable to complete d/t cognitive impairment         Core Components/Risk Factors/Patient Goals at Admission:  Personal Goals and Risk Factors at Admission - 07/25/24 1307       Core Components/Risk Factors/Patient Goals on Admission   Improve shortness of breath with ADL's Yes    Intervention Provide education, individualized exercise plan and daily activity instruction to help decrease symptoms of SOB with activities of daily living.    Expected Outcomes Short Term: Improve cardiorespiratory fitness to achieve a reduction of symptoms when performing ADLs;Long Term: Be able to perform more ADLs without symptoms or delay the onset of symptoms    Increase knowledge of respiratory medications and ability to use respiratory devices properly  Yes    Intervention Provide education and demonstration as needed of appropriate use of medications, inhalers, and oxygen therapy.    Expected Outcomes Short Term: Achieves understanding of medications use. Understands that oxygen is  a medication prescribed by physician. Demonstrates appropriate use of inhaler and oxygen therapy.;Long Term: Maintain appropriate use of medications, inhalers, and oxygen therapy.          Core Components/Risk Factors/Patient Goals Review:    Core Components/Risk  Factors/Patient Goals at Discharge (Final Review):    ITP Comments:   Comments: Dr. Slater Staff is Medical Director for Pulmonary Rehab at [facility].

## 2024-07-25 NOTE — Progress Notes (Signed)
 Judy [name] 78 y.o. female Pulmonary Rehab Orientation Note This patient who was referred to Pulmonary Rehab by Dr. Annella with the diagnosis of asthma arrived today in Cardiac and Pulmonary Rehab. She  arrived ambulatory with assistive device with shuffling gait. She  does not carry portable oxygen. Per patient, Judy [name] uses oxygen never. Color good, skin warm and dry. Patient is oriented to time and place, but noted mild cognitive impairment. Patient's medical history, psychosocial health, and medications reviewed. Psychosocial assessment reveals patient lives with alone. Judy [name] is currently retired. Patient hobbies include watching tv. Patient reports her stress level is low. Areas of stress/anxiety include health. Patient does not exhibit signs of depression. Pt sees therapist regularly for her mental health and medication management. PHQ2/9 survey was declined. Judy [name] shows fair  coping skills with positive outlook on life. Offered emotional support and reassurance. Will continue to monitor and evaluate progress toward psychosocial goal(s) of decreased health related concern. Physical assessment reveals heart rate is normal, breath sounds clear to auscultation, no wheezes, rales, or rhonchi. Grip strength equal, strong. Distal pulses present. Judy [name] reports she does take medications as prescribed. Patient states she follows a regular  diet. The patient reports no specific efforts to gain or lose weight.. Pt's weight will be monitored closely. Demonstration and practice of PLB using pulse oximeter. Judy [name] able to return demonstration satisfactorily. Safety and hand hygiene in the exercise area reviewed with patient. Judy [name] voices understanding of the information reviewed. Department expectations discussed with patient and achievable goals were set. The patient shows enthusiasm about attending the program and we look forward to working with Judy. Judy [name] completed a 6 min walk test today and is scheduled  to begin exercise after she completes her home health therapy.    [id number] Judy [name]

## 2024-07-31 ENCOUNTER — Encounter (HOSPITAL_COMMUNITY): Admission: RE | Admit: 2024-07-31 | Source: Ambulatory Visit

## 2024-08-02 ENCOUNTER — Encounter (HOSPITAL_COMMUNITY)

## 2024-08-07 ENCOUNTER — Encounter (HOSPITAL_COMMUNITY)

## 2024-08-08 NOTE — Progress Notes (Signed)
 Pulmonary Individual Treatment Plan  Patient Details  Name: Judy [name] MRN: [id number] Date of Birth: 10-21-1945 Referring Provider:   Conrad Ports Pulmonary Rehab Walk Test from 07/25/2024 in [facility]  Referring Provider Dr. Annella    Initial Encounter Date:  Flowsheet Row Pulmonary Rehab Walk Test from 07/25/2024 in [facility]  Date 07/25/24    Visit Diagnosis: Asthma, unspecified asthma severity, unspecified whether complicated, unspecified whether persistent  Patient's Home Medications on Admission:   Current Outpatient Medications:    acetaminophen  (TYLENOL ) 325 MG tablet, Take 2 tablets (650 mg total) by mouth every 6 (six) hours as needed for mild pain (or Fever >/= 101)., Disp: , Rfl:    albuterol  (VENTOLIN  HFA) 108 (90 Base) MCG/ACT inhaler, Inhale 2 puffs into the lungs every 6 (six) hours as needed for wheezing or shortness of breath., Disp: 18 each, Rfl: 2   aspirin  EC 81 MG tablet, Take 1 tablet (81 mg total) by mouth daily. Swallow whole., Disp: 90 tablet, Rfl: 3   b complex vitamins capsule, Take 1 capsule by mouth daily. With C, Disp: , Rfl:    budesonide  (PULMICORT ) 0.5 MG/2ML nebulizer solution, Take 2 mLs (0.5 mg total) by nebulization 2 (two) times daily. DX:J45.40, Disp: 120 mL, Rfl: 12   buPROPion  (WELLBUTRIN  XL) 150 MG 24 hr tablet, TAKE 1 TABLET BY MOUTH EVERY DAY, Disp: 90 tablet, Rfl: 1   cholecalciferol (VITAMIN D3) 25 MCG (1000 UNIT) tablet, Take 1,000 Units by mouth daily., Disp: , Rfl:    INGREZZA  80 MG capsule, Take 1 capsule (80 mg total) by mouth daily., Disp: 30 capsule, Rfl: 5   ipratropium-albuterol  (DUONEB) 0.5-2.5 (3) MG/3ML SOLN, Take 3 mLs by nebulization 3 (three) times daily as needed. 1 treatment in the morning, 1 treatment in the evenings, every 6 hours as needed in between for shortness of breath or wheezing DX:  J45.40, Disp: 270 mL, Rfl: 12   meclizine  (ANTIVERT ) 12.5 MG tablet, Take 1 tablet (12.5 mg total) by mouth 3 (three) times daily as needed for dizziness., Disp: 30 tablet, Rfl: 2   mirtazapine  (REMERON ) 7.5 MG tablet, Take 0.5 tablets (3.75 mg total) by mouth at bedtime., Disp: 45 tablet, Rfl: 1   Omega-3 Fatty Acids (FISH OIL BURP-LESS PO), Take 700 mg by mouth daily., Disp: , Rfl:    omeprazole  (PRILOSEC) 20 MG capsule, Take 1 capsule (20 mg total) by mouth 2 (two) times daily before a meal., Disp: 14 capsule, Rfl: 0   polyethylene glycol (MIRALAX  / GLYCOLAX ) 17 g packet, Take 17 g by mouth daily., Disp: 30 each, Rfl: 2   atorvastatin  (LIPITOR) 10 MG tablet, TAKE 1 TABLET BY MOUTH EVERY DAY (Patient not taking: Reported on 07/25/2024), Disp: 90 tablet, Rfl: 1   metoprolol  succinate (TOPROL -XL) 25 MG 24 hr tablet, Take 1 tablet (25 mg total) by mouth daily. Take with or immediately following a meal. (Patient not taking: Reported on 07/25/2024), Disp: 90 tablet, Rfl: 3   propranolol  (INDERAL ) 10 MG tablet, TAKE 1 TABLET BY MOUTH THREE TIMES A DAY AS NEEDED (Patient not taking: Reported on 07/25/2024), Disp: 270 tablet, Rfl: 2  Past Medical History: Past Medical History:  Diagnosis Date   Allergic rhinitis    Anemia    Anxiety    Arthritis    Bipolar 1 disorder (HCC)    Cancer (HCC)    Depression  Diverticulosis of colon (without mention of hemorrhage) 08/25/2011   Dr. Burnette   Dyspnea    Endometrial cancer [facility]) 04/06/2023   Generalized weakness 07/04/2023   Hearing loss in left ear    since birth   Hyperlipidemia    Hypertension    resolved   Insomnia    Obesity    Palpitation    Renal disorder    Tardive dyskinesia    Tremor    Vertigo     Tobacco Use: Social History   Tobacco Use  Smoking Status Never  Smokeless Tobacco Never    Labs: Review Flowsheet  More data exists      Latest Ref Rng & Units 10/29/2021 03/02/2022 11/04/2022 11/08/2023 05/08/2024  Labs for ITP  Cardiac and Pulmonary Rehab  Cholestrol 100 - 199 mg/dL 829  843  848  828  786   LDL (calc) 0 - 99 mg/dL 73  71  63  83  873   HDL-C >39 mg/dL 75  70  72  72  74   Trlycerides 0 - 149 mg/dL 870  77  86  86  76   Hemoglobin A1c 4.8 - 5.6 % 5.3  5.5  5.6  5.5  -    Capillary Blood Glucose: Lab Results  Component Value Date   GLUCAP 108 (H) 04/20/2022   GLUCAP 79 11/08/2019   GLUCAP 96 12/10/2013   GLUCAP 98 08/25/2011     Pulmonary Assessment Scores:  Pulmonary Assessment Scores     Row Name 07/25/24 1303         ADL UCSD   ADL Phase Entry     SOB Score total 69       CAT Score   CAT Score 26       mMRC Score   mMRC Score 4       UCSD: Self-administered rating of dyspnea associated with activities of daily living (ADLs) 6-point scale (0 = not at all to 5 = maximal or unable to do because of breathlessness)  Scoring Scores range from 0 to 120.  Minimally important difference is 5 units  CAT: CAT can identify the health impairment of COPD patients and is better correlated with disease progression.  CAT has a scoring range of zero to 40. The CAT score is classified into four groups of low (less than 10), medium (10 - 20), high (21-30) and very high (31-40) based on the impact level of disease on health status. A CAT score over 10 suggests significant symptoms.  A worsening CAT score could be explained by an exacerbation, poor medication adherence, poor inhaler technique, or progression of COPD or comorbid conditions.  CAT MCID is 2 points  mMRC: mMRC (Modified Medical Research Council) Dyspnea Scale is used to assess the degree of baseline functional disability in patients of respiratory disease due to dyspnea. No minimal important difference is established. A decrease in score of 1 point or greater is considered a positive change.   Pulmonary Function Assessment:  Pulmonary Function Assessment - 07/25/24 1302       Breath   Bilateral Breath Sounds Clear     Shortness of Breath Yes;Limiting activity          Exercise Target Goals: Exercise Program Goal: Individual exercise prescription set using results from initial 6 min walk test and THRR while considering  patient's activity barriers and safety.   Exercise Prescription Goal: Initial exercise prescription builds to 30-45 minutes a day of aerobic activity, 2-3 days  per week.  Home exercise guidelines will be given to patient during program as part of exercise prescription that the participant will acknowledge.  Activity Barriers & Risk Stratification:  Activity Barriers & Cardiac Risk Stratification - 07/25/24 1301       Activity Barriers & Cardiac Risk Stratification   Activity Barriers Deconditioning;Muscular Weakness;Shortness of Breath;Back Problems;History of Falls;Balance Concerns          6 Minute Walk:  6 Minute Walk     Row Name 07/25/24 1234         6 Minute Walk   Phase Initial     Distance 600 feet     Walk Time 6 minutes     # of Rest Breaks 1     MPH 1.14     METS 1.96     RPE 13     Perceived Dyspnea  1     VO2 Peak 6.86     Symptoms No     Resting HR 73 bpm     Resting BP 130/70     Resting Oxygen Saturation  98 %     Exercise Oxygen Saturation  during 6 min walk 95 %     Max Ex. HR 107 bpm     Max Ex. BP 132/80     2 Minute Post BP 122/80       Interval HR   1 Minute HR 102     2 Minute HR 106     3 Minute HR 107     4 Minute HR 107     5 Minute HR 99     6 Minute HR 76     2 Minute Post HR 68     Interval Heart Rate? Yes       Interval Oxygen   Interval Oxygen? Yes     Baseline Oxygen Saturation % 98 %     1 Minute Oxygen Saturation % 95 %     1 Minute Liters of Oxygen 0 L     2 Minute Oxygen Saturation % 96 %     2 Minute Liters of Oxygen 0 L     3 Minute Oxygen Saturation % 98 %     3 Minute Liters of Oxygen 0 L     4 Minute Oxygen Saturation % 99 %     4 Minute Liters of Oxygen 0 L     5 Minute Oxygen Saturation % 99 %     5  Minute Liters of Oxygen 0 L     6 Minute Oxygen Saturation % 99 %     6 Minute Liters of Oxygen 0 L     2 Minute Post Oxygen Saturation % 98 %     2 Minute Post Liters of Oxygen 0 L        Oxygen Initial Assessment:  Oxygen Initial Assessment - 07/25/24 1302       Home Oxygen   Home Oxygen Device None    Sleep Oxygen Prescription None    Home Exercise Oxygen Prescription None    Home Resting Oxygen Prescription None      Initial 6 min Walk   Oxygen Used None      Program Oxygen Prescription   Program Oxygen Prescription None      Intervention   Short Term Goals To learn and understand importance of maintaining oxygen saturations>88%;To learn and demonstrate proper use of respiratory medications;To learn and demonstrate proper pursed lip breathing techniques or  other breathing techniques. ;To learn and understand importance of monitoring SPO2 with pulse oximeter and demonstrate accurate use of the pulse oximeter.;To learn and exhibit compliance with exercise, home and travel O2 prescription    Long  Term Goals Exhibits compliance with exercise, home  and travel O2 prescription;Maintenance of O2 saturations>88%;Compliance with respiratory medication;Demonstrates proper use of MDI's;Exhibits proper breathing techniques, such as pursed lip breathing or other method taught during program session;Verbalizes importance of monitoring SPO2 with pulse oximeter and return demonstration          Oxygen Re-Evaluation:  Oxygen Re-Evaluation     Row Name 08/01/24 1451             Program Oxygen Prescription   Program Oxygen Prescription None         Home Oxygen   Home Oxygen Device None       Sleep Oxygen Prescription None       Home Exercise Oxygen Prescription None       Home Resting Oxygen Prescription None         Goals/Expected Outcomes   Short Term Goals To learn and understand importance of maintaining oxygen saturations>88%;To learn and demonstrate proper use of  respiratory medications;To learn and demonstrate proper pursed lip breathing techniques or other breathing techniques. ;To learn and understand importance of monitoring SPO2 with pulse oximeter and demonstrate accurate use of the pulse oximeter.;To learn and exhibit compliance with exercise, home and travel O2 prescription       Long  Term Goals Exhibits compliance with exercise, home  and travel O2 prescription;Maintenance of O2 saturations>88%;Compliance with respiratory medication;Demonstrates proper use of MDI's;Exhibits proper breathing techniques, such as pursed lip breathing or other method taught during program session;Verbalizes importance of monitoring SPO2 with pulse oximeter and return demonstration       Goals/Expected Outcomes Compliance and understanding of oxygen saturation monitoring and breathing techniques to decrease shortness of breath.          Oxygen Discharge (Final Oxygen Re-Evaluation):  Oxygen Re-Evaluation - 08/01/24 1451       Program Oxygen Prescription   Program Oxygen Prescription None      Home Oxygen   Home Oxygen Device None    Sleep Oxygen Prescription None    Home Exercise Oxygen Prescription None    Home Resting Oxygen Prescription None      Goals/Expected Outcomes   Short Term Goals To learn and understand importance of maintaining oxygen saturations>88%;To learn and demonstrate proper use of respiratory medications;To learn and demonstrate proper pursed lip breathing techniques or other breathing techniques. ;To learn and understand importance of monitoring SPO2 with pulse oximeter and demonstrate accurate use of the pulse oximeter.;To learn and exhibit compliance with exercise, home and travel O2 prescription    Long  Term Goals Exhibits compliance with exercise, home  and travel O2 prescription;Maintenance of O2 saturations>88%;Compliance with respiratory medication;Demonstrates proper use of MDI's;Exhibits proper breathing techniques, such as pursed lip  breathing or other method taught during program session;Verbalizes importance of monitoring SPO2 with pulse oximeter and return demonstration    Goals/Expected Outcomes Compliance and understanding of oxygen saturation monitoring and breathing techniques to decrease shortness of breath.          Initial Exercise Prescription:  Initial Exercise Prescription - 07/25/24 1200       Date of Initial Exercise RX and Referring Provider   Date 07/25/24    Referring Provider Dr. Annella    Expected Discharge Date 10/30/24  NuStep   Level 1    SPM 50    Minutes 30    METs 1.5      Prescription Details   Frequency (times per week) 2    Duration Progress to 30 minutes of continuous aerobic without signs/symptoms of physical distress      Intensity   THRR 40-80% of Max Heartrate 57-114    Ratings of Perceived Exertion 11-13    Perceived Dyspnea 0-4      Progression   Progression Continue to progress workloads to maintain intensity without signs/symptoms of physical distress.      Resistance Training   Training Prescription Yes    Weight yellow bands    Reps 10-15          Perform Capillary Blood Glucose checks as needed.  Exercise Prescription Changes:   Exercise Comments:   Exercise Goals and Review:   Exercise Goals     Row Name 07/25/24 1302             Exercise Goals   Increase Physical Activity Yes       Intervention Provide advice, education, support and counseling about physical activity/exercise needs.;Develop an individualized exercise prescription for aerobic and resistive training based on initial evaluation findings, risk stratification, comorbidities and participant's personal goals.       Expected Outcomes Short Term: Attend rehab on a regular basis to increase amount of physical activity.;Long Term: Exercising regularly at least 3-5 days a week.;Long Term: Add in home exercise to make exercise part of routine and to increase amount of physical  activity.       Increase Strength and Stamina Yes       Intervention Provide advice, education, support and counseling about physical activity/exercise needs.;Develop an individualized exercise prescription for aerobic and resistive training based on initial evaluation findings, risk stratification, comorbidities and participant's personal goals.       Expected Outcomes Short Term: Increase workloads from initial exercise prescription for resistance, speed, and METs.;Short Term: Perform resistance training exercises routinely during rehab and add in resistance training at home;Long Term: Improve cardiorespiratory fitness, muscular endurance and strength as measured by increased METs and functional capacity ( )       Able to understand and use rate of perceived exertion (RPE) scale Yes       Intervention Provide education and explanation on how to use RPE scale       Expected Outcomes Short Term: Able to use RPE daily in rehab to express subjective intensity level;Long Term:  Able to use RPE to guide intensity level when exercising independently       Able to understand and use Dyspnea scale Yes       Intervention Provide education and explanation on how to use Dyspnea scale       Expected Outcomes Short Term: Able to use Dyspnea scale daily in rehab to express subjective sense of shortness of breath during exertion;Long Term: Able to use Dyspnea scale to guide intensity level when exercising independently       Knowledge and understanding of Target Heart Rate Range (THRR) Yes       Intervention Provide education and explanation of THRR including how the numbers were predicted and where they are located for reference       Expected Outcomes Short Term: Able to state/look up THRR;Short Term: Able to use daily as guideline for intensity in rehab;Long Term: Able to use THRR to govern intensity when exercising independently  Understanding of Exercise Prescription Yes       Intervention Provide  education, explanation, and written materials on patient's individual exercise prescription       Expected Outcomes Short Term: Able to explain program exercise prescription;Long Term: Able to explain home exercise prescription to exercise independently          Exercise Goals Re-Evaluation :  Exercise Goals Re-Evaluation     Row Name 08/01/24 1449             Exercise Goal Re-Evaluation   Exercise Goals Review Increase Physical Activity;Able to understand and use Dyspnea scale;Understanding of Exercise Prescription;Increase Strength and Stamina;Knowledge and understanding of Target Heart Rate Range (THRR);Able to understand and use rate of perceived exertion (RPE) scale       Comments Pt to begin exercise with pulmonary rehab when she has completed home health. Will progress as tolerated.       Expected Outcomes Through exercise at rehab and home, the patient will decrease shortness of breath with daily activities and feel confident in carrying out an exercise regimen at home.          Discharge Exercise Prescription (Final Exercise Prescription Changes):   Nutrition:  Target Goals: Understanding of nutrition guidelines, daily intake of sodium 1500mg , cholesterol 200mg , calories 30% from fat and 7% or less from saturated fats, daily to have 5 or more servings of fruits and vegetables.  Biometrics:    Nutrition Therapy Plan and Nutrition Goals:   Nutrition Assessments:  MEDIFICTS Score Key: >=70 Need to make dietary changes  40-70 Heart Healthy Diet <= 40 Therapeutic Level Cholesterol Diet   Picture Your Plate Scores: <59 Unhealthy dietary pattern with much room for improvement. 41-50 Dietary pattern unlikely to meet recommendations for good health and room for improvement. 51-60 More healthful dietary pattern, with some room for improvement.  >60 Healthy dietary pattern, although there may be some specific behaviors that could be improved.    Nutrition Goals  Re-Evaluation:   Nutrition Goals Discharge (Final Nutrition Goals Re-Evaluation):   Psychosocial: Target Goals: Acknowledge presence or absence of significant depression and/or stress, maximize coping skills, provide positive support system. Participant is able to verbalize types and ability to use techniques and skills needed for reducing stress and depression.  Initial Review & Psychosocial Screening:  Initial Psych Review & Screening - 07/25/24 1303       Initial Review   Current issues with History of Depression;Current Psychotropic Meds      Family Dynamics   Good Support System? Yes    Comments Pt has mild cognitive impairment. Her brother, Victory, and his wife Merlynn assist pt. She lives alone, no longer drives. Sees therapist regulary for medication management, hx of bipolar, depression      Barriers   Psychosocial barriers to participate in program There are no identifiable barriers or psychosocial needs.      Screening Interventions   Interventions Encouraged to exercise    Expected Outcomes Short Term goal: Utilizing psychosocial counselor, staff and physician to assist with identification of specific Stressors or current issues interfering with healing process. Setting desired goal for each stressor or current issue identified.;Long Term Goal: Stressors or current issues are controlled or eliminated.;Short Term goal: Identification and review with participant of any Quality of Life or Depression concerns found by scoring the questionnaire.;Long Term goal: The participant improves quality of Life and PHQ9 Scores as seen by post scores and/or verbalization of changes  Quality of Life Scores:  Scores of 19 and below usually indicate a poorer quality of life in these areas.  A difference of  2-3 points is a clinically meaningful difference.  A difference of 2-3 points in the total score of the Quality of Life Index has been associated with significant improvement in  overall quality of life, self-image, physical symptoms, and general health in studies assessing change in quality of life.  PHQ-9: Review Flowsheet  More data exists      12/28/2023 11/08/2023 07/04/2023 12/16/2022 11/04/2022  Depression screen PHQ 2/9  Decreased Interest 0 2 3 0 0  Down, Depressed, Hopeless 1 2 3  0 2  PHQ - 2 Score 1 4 6  0 2  Altered sleeping 1 0 2 0 0  Tired, decreased energy 1 3 3 2 3   Change in appetite 0 3 3 0 0  Feeling bad or failure about yourself  0 1 3 0 1  Trouble concentrating 0 3 1 0 3  Moving slowly or fidgety/restless 0 0 2 0 3  Suicidal thoughts 0 0 0 0 0  PHQ-9 Score 3 14 20 2 12   Difficult doing work/chores Not difficult at all Somewhat difficult - - -   Interpretation of Total Score  Total Score Depression Severity:  1-4 = Minimal depression, 5-9 = Mild depression, 10-14 = Moderate depression, 15-19 = Moderately severe depression, 20-27 = Severe depression   Psychosocial Evaluation and Intervention:  Psychosocial Evaluation - 07/25/24 1305       Psychosocial Evaluation & Interventions   Interventions Encouraged to exercise with the program and follow exercise prescription    Comments Pt has mild cognitive impairment. Her brother, Victory, and his wife Merlynn assist pt. She lives alone, no longer drives. Sees therapist regulary for medication management, hx of bipolar, depression    Expected Outcomes For Azaylea to participate in PR free of any psy/soc barriers or concerns    Continue Psychosocial Services  Follow up required by staff          Psychosocial Re-Evaluation:  Psychosocial Re-Evaluation     Row Name 08/03/24 1419             Psychosocial Re-Evaluation   Current issues with History of Depression;Current Psychotropic Meds       Comments Monthly psychosocial re-evaluation as follows: No changes since assessment. Syliva has not started the program yet. She is waiting to finish home PT first.       Expected Outcomes For Annaleia to  participate in PR free of any psy/soc barriers or concerns       Interventions Encouraged to attend Pulmonary Rehabilitation for the exercise       Continue Psychosocial Services  Follow up required by staff          Psychosocial Discharge (Final Psychosocial Re-Evaluation):  Psychosocial Re-Evaluation - 08/03/24 1419       Psychosocial Re-Evaluation   Current issues with History of Depression;Current Psychotropic Meds    Comments Monthly psychosocial re-evaluation as follows: No changes since assessment. Syliva has not started the program yet. She is waiting to finish home PT first.    Expected Outcomes For Wyndi to participate in PR free of any psy/soc barriers or concerns    Interventions Encouraged to attend Pulmonary Rehabilitation for the exercise    Continue Psychosocial Services  Follow up required by staff          Education: Education Goals: Education classes will be provided on a weekly basis,  covering required topics. Participant will state understanding/return demonstration of topics presented.  Learning Barriers/Preferences:  Learning Barriers/Preferences - 07/25/24 1306       Learning Barriers/Preferences   Learning Barriers Inability to learn new things;Exercise Concerns;Reading    Learning Preferences Individual Instruction;Verbal Instruction;Written Material          Education Topics: Know Your Numbers Group instruction that is supported by a PowerPoint presentation. Instructor discusses importance of knowing and understanding resting, exercise, and post-exercise oxygen saturation, heart rate, and blood pressure. Oxygen saturation, heart rate, blood pressure, rating of perceived exertion, and dyspnea are reviewed along with a normal range for these values.    Exercise for the Pulmonary Patient Group instruction that is supported by a PowerPoint presentation. Instructor discusses benefits of exercise, core components of exercise, frequency, duration, and  intensity of an exercise routine, importance of utilizing pulse oximetry during exercise, safety while exercising, and options of places to exercise outside of rehab.    MET Level  Group instruction provided by PowerPoint, verbal discussion, and written material to support subject matter. Instructor reviews what METs are and how to increase METs.    Pulmonary Medications Verbally interactive group education provided by instructor with focus on inhaled medications and proper administration.   Anatomy and Physiology of the Respiratory System Group instruction provided by PowerPoint, verbal discussion, and written material to support subject matter. Instructor reviews respiratory cycle and anatomical components of the respiratory system and their functions. Instructor also reviews differences in obstructive and restrictive respiratory diseases with examples of each.    Oxygen Safety Group instruction provided by PowerPoint, verbal discussion, and written material to support subject matter. There is an overview of "What is Oxygen" and "Why do we need it".  Instructor also reviews how to create a safe environment for oxygen use, the importance of using oxygen as prescribed, and the risks of noncompliance. There is a brief discussion on traveling with oxygen and resources the patient may utilize.   Oxygen Use Group instruction provided by PowerPoint, verbal discussion, and written material to discuss how supplemental oxygen is prescribed and different types of oxygen supply systems. Resources for more information are provided.    Breathing Techniques Group instruction that is supported by demonstration and informational handouts. Instructor discusses the benefits of pursed lip and diaphragmatic breathing and detailed demonstration on how to perform both.     Risk Factor Reduction Group instruction that is supported by a PowerPoint presentation. Instructor discusses the definition of a risk  factor, different risk factors for pulmonary disease, and how the heart and lungs work together.   Pulmonary Diseases Group instruction provided by PowerPoint, verbal discussion, and written material to support subject matter. Instructor gives an overview of the different type of pulmonary diseases. There is also a discussion on risk factors and symptoms as well as ways to manage the diseases.   Stress and Energy Conservation Group instruction provided by PowerPoint, verbal discussion, and written material to support subject matter. Instructor gives an overview of stress and the impact it can have on the body. Instructor also reviews ways to reduce stress. There is also a discussion on energy conservation and ways to conserve energy throughout the day.   Warning Signs and Symptoms Group instruction provided by PowerPoint, verbal discussion, and written material to support subject matter. Instructor reviews warning signs and symptoms of stroke, heart attack, cold and flu. Instructor also reviews ways to prevent the spread of infection.   Other Education Group or individual verbal,  written, or video instructions that support the educational goals of the pulmonary rehab program.    Knowledge Questionnaire Score:  Knowledge Questionnaire Score - 07/25/24 1306       Knowledge Questionnaire Score   Pre Score --   unable to complete d/t cognitive impairment         Core Components/Risk Factors/Patient Goals at Admission:  Personal Goals and Risk Factors at Admission - 07/25/24 1307       Core Components/Risk Factors/Patient Goals on Admission   Improve shortness of breath with ADL's Yes    Intervention Provide education, individualized exercise plan and daily activity instruction to help decrease symptoms of SOB with activities of daily living.    Expected Outcomes Short Term: Improve cardiorespiratory fitness to achieve a reduction of symptoms when performing ADLs;Long Term: Be able to  perform more ADLs without symptoms or delay the onset of symptoms    Increase knowledge of respiratory medications and ability to use respiratory devices properly  Yes    Intervention Provide education and demonstration as needed of appropriate use of medications, inhalers, and oxygen therapy.    Expected Outcomes Short Term: Achieves understanding of medications use. Understands that oxygen is a medication prescribed by physician. Demonstrates appropriate use of inhaler and oxygen therapy.;Long Term: Maintain appropriate use of medications, inhalers, and oxygen therapy.          Core Components/Risk Factors/Patient Goals Review:   Goals and Risk Factor Review     Row Name 08/03/24 1421             Core Components/Risk Factors/Patient Goals Review   Personal Goals Review Improve shortness of breath with ADL's;Develop more efficient breathing techniques such as purse lipped breathing and diaphragmatic breathing and practicing self-pacing with activity.;Increase knowledge of respiratory medications and ability to use respiratory devices properly.       Review Monthly review of patient's Core Components/Risk Factors/Patient Goals are as follows: Unable to assess. Malayna has not started the program yet. She is due to start after she finishes HH PT.       Expected Outcomes Pt will show progress toward meeting expected goals and outcomes.          Core Components/Risk Factors/Patient Goals at Discharge (Final Review):   Goals and Risk Factor Review - 08/03/24 1421       Core Components/Risk Factors/Patient Goals Review   Personal Goals Review Improve shortness of breath with ADL's;Develop more efficient breathing techniques such as purse lipped breathing and diaphragmatic breathing and practicing self-pacing with activity.;Increase knowledge of respiratory medications and ability to use respiratory devices properly.    Review Monthly review of patient's Core Components/Risk Factors/Patient  Goals are as follows: Unable to assess. Adriona has not started the program yet. She is due to start after she finishes HH PT.    Expected Outcomes Pt will show progress toward meeting expected goals and outcomes.          ITP Comments: Pt expected to make progress toward Pulmonary Rehab goals. Recommend exercise, life style modification, education, and utilization of breathing techniques to increase stamina and strength, while also decreasing shortness of breath with exertion.    Comments: Dr. Slater Staff is Medical Director for Pulmonary Rehab at [facility].

## 2024-08-09 ENCOUNTER — Encounter (HOSPITAL_COMMUNITY)

## 2024-08-14 ENCOUNTER — Encounter (HOSPITAL_COMMUNITY)

## 2024-08-14 ENCOUNTER — Encounter: Payer: Self-pay | Admitting: Nurse Practitioner

## 2024-08-14 ENCOUNTER — Ambulatory Visit: Payer: Self-pay | Admitting: Nurse Practitioner

## 2024-08-14 ENCOUNTER — Telehealth (HOSPITAL_COMMUNITY): Payer: Self-pay | Admitting: *Deleted

## 2024-08-14 VITALS — BP 110/60 | HR 98 | Temp 99.2°F | Ht 69.0 in | Wt 131.4 lb

## 2024-08-14 DIAGNOSIS — I10 Essential (primary) hypertension: Secondary | ICD-10-CM

## 2024-08-14 DIAGNOSIS — E782 Mixed hyperlipidemia: Secondary | ICD-10-CM | POA: Diagnosis not present

## 2024-08-14 DIAGNOSIS — R7309 Other abnormal glucose: Secondary | ICD-10-CM | POA: Diagnosis not present

## 2024-08-14 DIAGNOSIS — I129 Hypertensive chronic kidney disease with stage 1 through stage 4 chronic kidney disease, or unspecified chronic kidney disease: Secondary | ICD-10-CM | POA: Diagnosis not present

## 2024-08-14 DIAGNOSIS — Z79899 Other long term (current) drug therapy: Secondary | ICD-10-CM

## 2024-08-14 DIAGNOSIS — H9192 Unspecified hearing loss, left ear: Secondary | ICD-10-CM

## 2024-08-14 DIAGNOSIS — N183 Chronic kidney disease, stage 3 unspecified: Secondary | ICD-10-CM

## 2024-08-14 DIAGNOSIS — N3944 Nocturnal enuresis: Secondary | ICD-10-CM

## 2024-08-14 MED ORDER — OXYBUTYNIN CHLORIDE ER 10 MG PO TB24: 10.0000 mg | ORAL_TABLET | Freq: Every day | ORAL | 2 refills | Status: DC

## 2024-08-14 NOTE — Patient Instructions (Addendum)
 Dr. Patwardin (Cardiology) [address] 5th Floor  [phone]

## 2024-08-14 NOTE — Progress Notes (Signed)
 Judy [name], CMA,acting as a neurosurgeon for Judy Ada, FNP.,have documented all relevant documentation on the behalf of Judy Ada, FNP,as directed by  Judy Ada, FNP while in the presence of Judy Ada, FNP.  Subjective:  Patient ID: Judy [name] , female    DOB: 1946-03-03 , 77 y.o.   MRN: [id number]  Chief Complaint  Patient presents with   Hypertension    Patient presents today for a bp and pre dm follow up, Patient reports compliance with medication. Patient denies any chest pain, SOB, or headaches. Patient has no concerns today.    Urinary Frequency    Patient reports she is leaking more urine at night, she reports she is having to do a pad along with her depends at night.      HPI  Discussed the use of AI scribe software for clinical note transcription with the patient, who gave verbal consent to proceed.  History of Present Illness Judy [name] is a 78 year old female with hypertension who presents for follow-up on blood pressure and urinary frequency.  She experiences urinary frequency, particularly nocturia, necessitating the use of a pad at night. During the day, she manages well with 'Dupim' and does not require a pad after taking it.  She takes her cholesterol medication every morning, although there is some uncertainty about her adherence in July. Her cholesterol levels have been rising, and she is concerned about this. She prefers taking her medication directly from the bottle rather than using a pill box.  She has a caregiver who visits twice a month to assist with bathing and house cleaning. Due to fear of falling, she only takes a full shower twice a month and performs 'bird baths' on other days. She does not have Medicaid and pays out of pocket for her caregiver.  She drinks about one and a half bottles of water  daily, which is less than the recommended amount. She consumes [employer] on weekends and drinks two bottles of Ensure daily. Her weight  has increased from 127 lbs in July to 131 lbs currently.  She has a history of seeing a behavioral health specialist, Judy [name], and there have been no changes in this aspect of her care.  Past Medical History:  Diagnosis Date   Allergic rhinitis    Anemia    Anxiety    Arthritis    Bipolar 1 disorder (HCC)    Cancer (HCC)    Depression    Diverticulosis of colon (without mention of hemorrhage) 08/25/2011   Dr. 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   Hyperlipidemia    Hypertension    resolved   Insomnia    Obesity    Palpitation    Renal disorder    Tardive dyskinesia    Tremor    Vertigo      Family History  Problem Relation Age of Onset   Hypertension Mother    Kidney disease Mother    Stomach cancer Father    Hyperlipidemia Sister    Hypothyroidism Sister    Prostate cancer Brother    Hypertension Brother    Kidney disease Brother    Stroke Brother    Prostate cancer Brother    Breast cancer Neg Hx    Pancreatic cancer Neg Hx    Ovarian cancer Neg Hx    Endometrial cancer Neg  Hx      Current Outpatient Medications:    acetaminophen  (TYLENOL ) 325 MG tablet, Take 2 tablets (650 mg total) by mouth every 6 (six) hours as needed for mild pain (or Fever >/= 101)., Disp: , Rfl:    albuterol  (VENTOLIN  HFA) 108 (90 Base) MCG/ACT inhaler, Inhale 2 puffs into the lungs every 6 (six) hours as needed for wheezing or shortness of breath., Disp: 18 each, Rfl: 2   aspirin  EC 81 MG tablet, Take 1 tablet (81 mg total) by mouth daily. Swallow whole., Disp: 90 tablet, Rfl: 3   b complex vitamins capsule, Take 1 capsule by mouth daily. With C, Disp: , Rfl:    budesonide  (PULMICORT ) 0.5 MG/2ML nebulizer solution, Take 2 mLs (0.5 mg total) by nebulization 2 (two) times daily. DX:J45.40, Disp: 120 mL, Rfl: 12   buPROPion  (WELLBUTRIN  XL) 150 MG 24 hr tablet, TAKE 1 TABLET BY MOUTH  EVERY DAY, Disp: 90 tablet, Rfl: 1   cholecalciferol (VITAMIN D3) 25 MCG (1000 UNIT) tablet, Take 1,000 Units by mouth daily., Disp: , Rfl:    INGREZZA  80 MG capsule, Take 1 capsule (80 mg total) by mouth daily., Disp: 30 capsule, Rfl: 5   ipratropium-albuterol  (DUONEB) 0.5-2.5 (3) MG/3ML SOLN, Take 3 mLs by nebulization 3 (three) times daily as needed. 1 treatment in the morning, 1 treatment in the evenings, every 6 hours as needed in between for shortness of breath or wheezing DX: J45.40, Disp: 270 mL, Rfl: 12   meclizine  (ANTIVERT ) 12.5 MG tablet, Take 1 tablet (12.5 mg total) by mouth 3 (three) times daily as needed for dizziness., Disp: 30 tablet, Rfl: 2   Omega-3 Fatty Acids (FISH OIL BURP-LESS PO), Take 700 mg by mouth daily., Disp: , Rfl:    polyethylene glycol (MIRALAX  / GLYCOLAX ) 17 g packet, Take 17 g by mouth daily., Disp: 30 each, Rfl: 2   atorvastatin  (LIPITOR) 10 MG tablet, TAKE 1 TABLET BY MOUTH EVERY DAY (Patient not taking: Reported on 08/14/2024), Disp: 90 tablet, Rfl: 1   metoprolol  succinate (TOPROL -XL) 25 MG 24 hr tablet, Take 1 tablet (25 mg total) by mouth daily. Take with or immediately following a meal. (Patient not taking: Reported on 08/14/2024), Disp: 90 tablet, Rfl: 3   mirtazapine  (REMERON ) 7.5 MG tablet, Take 0.5 tablets (3.75 mg total) by mouth at bedtime. (Patient not taking: Reported on 08/14/2024), Disp: 45 tablet, Rfl: 1   omeprazole  (PRILOSEC) 20 MG capsule, Take 1 capsule (20 mg total) by mouth 2 (two) times daily before a meal. (Patient not taking: Reported on 08/14/2024), Disp: 14 capsule, Rfl: 0   propranolol  (INDERAL ) 10 MG tablet, TAKE 1 TABLET BY MOUTH THREE TIMES A DAY AS NEEDED (Patient not taking: Reported on 08/14/2024), Disp: 270 tablet, Rfl: 2   No Known Allergies   Review of Systems  Constitutional: Negative.   Eyes: Negative.   Respiratory: Negative.    Cardiovascular:  Negative for chest pain, palpitations and leg swelling.  Musculoskeletal:  Negative.   Skin: Negative.   Neurological:  Negative for headaches.  Psychiatric/Behavioral: Negative.       Today's Vitals   08/14/24 1122  BP: 110/60  Pulse: 98  Temp: 99.2 F (37.3 C)  TempSrc: Oral  Weight: 131 lb 6.4 oz (59.6 kg)  Height: 5' 9 (1.753 m)  PainSc: 0-No pain   Body mass index is 19.4 kg/m.  Wt Readings from Last 3 Encounters:  08/21/24 131 lb 13.4 oz (59.8 kg)  08/14/24 131 lb 6.4 oz (  59.6 kg)  07/25/24 125 lb 10.6 oz (57 kg)     Objective:  Physical Exam Vitals and nursing note reviewed.  Constitutional:      General: She is not in acute distress.    Appearance: Normal appearance. She is well-developed.  HENT:     Head: Normocephalic and atraumatic.  Cardiovascular:     Rate and Rhythm: Normal rate and regular rhythm.     Pulses: Normal pulses.     Heart sounds: Normal heart sounds. No murmur heard. Pulmonary:     Effort: Pulmonary effort is normal. No respiratory distress.     Breath sounds: Normal breath sounds. No wheezing.  Musculoskeletal:        General: No swelling. Normal range of motion.     Comments: Using a rolling walker  Skin:    General: Skin is warm and dry.     Capillary Refill: Capillary refill takes less than 2 seconds.  Neurological:     General: No focal deficit present.     Mental Status: She is alert and oriented to person, place, and time.     Cranial Nerves: No cranial nerve deficit.     Motor: No weakness.  Psychiatric:        Mood and Affect: Mood normal.        Behavior: Behavior normal.        Thought Content: Thought content normal.        Judgment: Judgment normal.         Assessment And Plan:  Essential hypertension Assessment & Plan: Blood pressure stable at 110/60 mmHg. No antihypertensive medication needed. - Continue monitoring blood pressure regularly. Stage 3 CKD with dehydration possibly contributing to elevated sodium and kidney function decline. - Encouraged increased water  intake to at  least 64 ounces per day. - Rechecked kidney function tests.  Discussed hydration's impact on health, including kidney function and blood pressure. - Encouraged regular hydration with at least 64 ounces of water  daily.  Orders: -     BMP8+eGFR  Abnormal glucose Assessment & Plan: Previous A1c over 5.6; monitoring to prevent diabetes progression.  Orders: -     Hemoglobin A1c  Mixed hyperlipidemia Assessment & Plan: Cholesterol levels increased despite atorvastatin . Discussed medication adherence and potential home health nurse assistance. - Encouraged use of a pill box for medication management. - Consider home health nurse assistance for medication management. - Rechecked cholesterol levels.  Orders: -     Lipid panel  Nocturnal enuresis Assessment & Plan: Urinary frequency at night likely due to decreased muscle strength during sleep. Discussed pelvic floor therapy and medication options. - Encouraged increased water  intake to prevent dehydration. - Consider pelvic floor physical therapy after cardiac rehab. - Reassess need for medication after cardiac rehab.   Medication management Assessment & Plan: Will refer to home health to assist with medication management.  Orders: -     Ambulatory referral to Home Health  Other long term (current) drug therapy -     CBC  Benign hypertension with chronic kidney disease, stage III (HCC) Assessment & Plan: Blood pressure stable at 110/60 mmHg. No antihypertensive medication needed. - Continue monitoring blood pressure regularly. Stage 3 CKD with dehydration possibly contributing to elevated sodium and kidney function decline. - Encouraged increased water  intake to at least 64 ounces per day. - Rechecked kidney function tests.  Discussed hydration's impact on health, including kidney function and blood pressure. - Encouraged regular hydration with at least 64 ounces of water   daily.   Hearing loss of left ear, unspecified  hearing loss type Assessment & Plan: Hearing loss likely from past occupational noise exposure. No recent evaluation conducted. - Recommended hearing evaluation to assess current hearing status.     Return for keep same next.  Patient was given opportunity to ask questions. Patient verbalized understanding of the plan and was able to repeat key elements of the plan. All questions were answered to their satisfaction.    Judy Judy Ada, FNP, have reviewed all documentation for this visit. The documentation on 08/14/24 for the exam, diagnosis, procedures, and orders are all accurate and complete.   IF YOU HAVE BEEN REFERRED TO A SPECIALIST, IT MAY TAKE 1-2 WEEKS TO SCHEDULE/PROCESS THE REFERRAL. IF YOU HAVE NOT HEARD FROM US /SPECIALIST IN TWO WEEKS, PLEASE GIVE US  A CALL AT [phone].

## 2024-08-14 NOTE — Telephone Encounter (Signed)
 Called pt's brother to discuss her starting PR. She is now done with Home Health. She has appts today and Thursday but will be able to start PR next Tuesday 11/11. Will cancel next two appts. Aliene Aris BS, ACSM-CEP 08/14/2024 9:58 AM

## 2024-08-15 LAB — BMP8+EGFR
BUN/Creatinine Ratio: 11 — ABNORMAL LOW (ref 12–28)
BUN: 11 mg/dL (ref 8–27)
CO2: 26 mmol/L (ref 20–29)
Calcium: 9.9 mg/dL (ref 8.7–10.3)
Chloride: 102 mmol/L (ref 96–106)
Creatinine, Ser: 0.96 mg/dL (ref 0.57–1.00)
Glucose: 79 mg/dL (ref 70–99)
Potassium: 4.4 mmol/L (ref 3.5–5.2)
Sodium: 141 mmol/L (ref 134–144)
eGFR: 61 mL/min/1.73 (ref >59)

## 2024-08-15 LAB — CBC
Hematocrit: 42.9 % (ref 34.0–46.6)
Hemoglobin: 13.8 g/dL (ref 11.1–15.9)
MCH: 30.9 pg (ref 26.6–33.0)
MCHC: 32.2 g/dL (ref 31.5–35.7)
MCV: 96 fL (ref 79–97)
Platelets: 233 x10E3/uL (ref 150–450)
RBC: 4.46 x10E6/uL (ref 3.77–5.28)
RDW: 12.1 % (ref 11.7–15.4)
WBC: 4.9 x10E3/uL (ref 3.4–10.8)

## 2024-08-15 LAB — LIPID PANEL
Chol/HDL Ratio: 2.4 ratio (ref 0.0–4.4)
Cholesterol, Total: 195 mg/dL (ref 100–199)
HDL: 82 mg/dL (ref >39)
LDL Chol Calc (NIH): 100 mg/dL — ABNORMAL HIGH (ref 0–99)
Triglycerides: 74 mg/dL (ref 0–149)
VLDL Cholesterol Cal: 13 mg/dL (ref 5–40)

## 2024-08-15 LAB — HEMOGLOBIN A1C
Est. average glucose Bld gHb Est-mCnc: 105 mg/dL
Hgb A1c MFr Bld: 5.3 % (ref 4.8–5.6)

## 2024-08-16 ENCOUNTER — Encounter (HOSPITAL_COMMUNITY)

## 2024-08-21 ENCOUNTER — Ambulatory Visit: Payer: Self-pay | Admitting: Nurse Practitioner

## 2024-08-21 ENCOUNTER — Encounter (HOSPITAL_COMMUNITY)
Admission: RE | Admit: 2024-08-21 | Discharge: 2024-08-21 | Disposition: A | Discharge status: Home / Self Care | Source: Ambulatory Visit | Attending: Pulmonary Disease | Admitting: Pulmonary Disease

## 2024-08-21 VITALS — Wt 131.8 lb

## 2024-08-21 DIAGNOSIS — N3944 Nocturnal enuresis: Secondary | ICD-10-CM | POA: Insufficient documentation

## 2024-08-21 DIAGNOSIS — J45909 Unspecified asthma, uncomplicated: Secondary | ICD-10-CM | POA: Diagnosis present

## 2024-08-21 DIAGNOSIS — Z79899 Other long term (current) drug therapy: Secondary | ICD-10-CM | POA: Insufficient documentation

## 2024-08-21 NOTE — Assessment & Plan Note (Signed)
 Previous A1c over 5.6; monitoring to prevent diabetes progression.

## 2024-08-21 NOTE — Assessment & Plan Note (Addendum)
 Blood pressure stable at 110/60 mmHg. No antihypertensive medication needed. - Continue monitoring blood pressure regularly. Stage 3 CKD with dehydration possibly contributing to elevated sodium and kidney function decline. - Encouraged increased water  intake to at least 64 ounces per day. - Rechecked kidney function tests.  Discussed hydration's impact on health, including kidney function and blood pressure. - Encouraged regular hydration with at least 64 ounces of water  daily.

## 2024-08-21 NOTE — Progress Notes (Signed)
 Daily Session Note  Patient Details  Name: Judy [name] MRN: [id number] Date of Birth: July 11, 1946 Referring Provider:   Conrad Ports Pulmonary Rehab Walk Test from 07/25/2024 in [facility] for Heart, Vascular, & Lung Health  Referring Provider Dr. Annella    Encounter Date: 08/21/2024  Check In:  Session Check In - 08/21/24 1335       Check-In   Supervising physician immediately available to respond to emergencies [facility] MD immediately available    Physician(s) Barnie Hila, NP    Location [facility]    Staff Present Ronal Levin, RN, Maud Moats, MS, ACSM-CEP, Exercise Physiologist;Ysabela Keisler Claudene Idelia Aris BS, ACSM-CEP, Exercise Physiologist;Annedrea Violetta, RN, MHA    Virtual Visit No    Medication changes reported     No    Fall or balance concerns reported    Yes    Comments unstable gait, uses cane    Tobacco Cessation No Change    Warm-up and Cool-down Performed as group-led instruction     Resistance Training Performed No    VAD Patient? No    PAD/SET Patient? No      Pain Assessment   Currently in Pain? No/denies    Pain Score 0-No pain    Multiple Pain Sites No          Capillary Blood Glucose: No results found for this or any previous visit (from the past 24 hours).   Exercise Prescription Changes - 08/21/24 1500       Response to Exercise   Blood Pressure (Admit) 140/90    Blood Pressure (Exercise) 140/86    Blood Pressure (Exit) 120/70    Heart Rate (Admit) 75 bpm    Heart Rate (Exercise) 85 bpm    Heart Rate (Exit) 73 bpm    Oxygen Saturation (Admit) 98 %    Oxygen Saturation (Exercise) 99 %    Oxygen Saturation (Exit) 96 %    Rating of Perceived Exertion (Exercise) 13    Perceived Dyspnea (Exercise) 2    Duration Continue with 30 min of aerobic exercise without signs/symptoms of physical distress.    Intensity THRR unchanged      Progression   Progression Continue to  progress workloads to maintain intensity without signs/symptoms of physical distress.      Resistance Training   Training Prescription Yes    Weight yellow bands    Reps 10-15    Time 10 Minutes      NuStep   Level 1    SPM 28    Minutes 30    METs 1.2          Social History   Tobacco Use  Smoking Status Never  Smokeless Tobacco Never    Goals Met:  Proper associated with RPD/PD & O2 Sat Independence with exercise equipment Exercise tolerated well No report of concerns or symptoms today Strength training completed today  Goals Unmet:  Not Applicable  Comments: Service time is from 1316 to 1440.    Dr. Slater Staff is Medical Director for Pulmonary Rehab at [facility].

## 2024-08-21 NOTE — Assessment & Plan Note (Signed)
 Cholesterol levels increased despite atorvastatin . Discussed medication adherence and potential home health nurse assistance. - Encouraged use of a pill box for medication management. - Consider home health nurse assistance for medication management. - Rechecked cholesterol levels.

## 2024-08-21 NOTE — Assessment & Plan Note (Signed)
 Hearing loss likely from past occupational noise exposure. No recent evaluation conducted. - Recommended hearing evaluation to assess current hearing status.

## 2024-08-21 NOTE — Assessment & Plan Note (Signed)
 Urinary frequency at night likely due to decreased muscle strength during sleep. Discussed pelvic floor therapy and medication options. - Encouraged increased water  intake to prevent dehydration. - Consider pelvic floor physical therapy after cardiac rehab. - Reassess need for medication after cardiac rehab.

## 2024-08-21 NOTE — Assessment & Plan Note (Signed)
 Will refer to home health to assist with medication management.

## 2024-08-23 ENCOUNTER — Encounter (HOSPITAL_COMMUNITY)
Admission: RE | Admit: 2024-08-23 | Discharge: 2024-08-23 | Disposition: A | Discharge status: Home / Self Care | Source: Ambulatory Visit | Attending: Pulmonary Disease | Admitting: Pulmonary Disease

## 2024-08-23 DIAGNOSIS — J45909 Unspecified asthma, uncomplicated: Secondary | ICD-10-CM

## 2024-08-23 NOTE — Progress Notes (Signed)
 Daily Session Note  Patient Details  Name: Judy [name] MRN: [id number] Date of Birth: Feb 07, 1946 Referring Provider:   Conrad Ports Pulmonary Rehab Walk Test from 07/25/2024 in [facility] for Heart, Vascular, & Lung Health  Referring Provider Dr. Annella    Encounter Date: 08/23/2024  Check In:  Session Check In - 08/23/24 1337       Check-In   Supervising physician immediately available to respond to emergencies [facility] MD immediately available    Physician(s) Barnie Hila, NP    Location [facility]    Staff Present Ronal Levin, RN, Maud Moats, MS, ACSM-CEP, Exercise Physiologist;Raidyn Breiner Claudene Idelia Aris BS, ACSM-CEP, Exercise Physiologist    Virtual Visit No    Medication changes reported     No    Fall or balance concerns reported    Yes    Comments unstable gait, uses cane    Tobacco Cessation No Change    Warm-up and Cool-down Performed as group-led instruction     Resistance Training Performed No    VAD Patient? No    PAD/SET Patient? No      Pain Assessment   Currently in Pain? No/denies    Pain Score 0-No pain    Multiple Pain Sites No          Capillary Blood Glucose: No results found for this or any previous visit (from the past 24 hours).    Social History   Tobacco Use  Smoking Status Never  Smokeless Tobacco Never    Goals Met:  Proper associated with RPD/PD & O2 Sat Independence with exercise equipment Exercise tolerated well No report of concerns or symptoms today Strength training completed today  Goals Unmet:  Not Applicable  Comments: Service time is from 1311 to 1451.    Dr. Slater Staff is Medical Director for Pulmonary Rehab at [facility].

## 2024-08-28 ENCOUNTER — Encounter (HOSPITAL_COMMUNITY)
Admission: RE | Admit: 2024-08-28 | Discharge: 2024-08-28 | Disposition: A | Discharge status: Home / Self Care | Source: Ambulatory Visit | Attending: Pulmonary Disease | Admitting: Pulmonary Disease

## 2024-08-28 DIAGNOSIS — J45909 Unspecified asthma, uncomplicated: Secondary | ICD-10-CM

## 2024-08-28 NOTE — Progress Notes (Signed)
 Daily Session Note  Patient Details  Name: Judy [name] MRN: [id number] Date of Birth: 09-25-1946 Referring Provider:   Conrad Ports Pulmonary Rehab Walk Test from 07/25/2024 in [facility] for Heart, Vascular, & Lung Health  Referring Provider Dr. Annella    Encounter Date: 08/28/2024  Check In:  Session Check In - 08/28/24 1435       Check-In   Supervising physician immediately available to respond to emergencies [facility] MD immediately available    Physician(s) Josefa Beauvais, NP    Location [facility]    Staff Present Ronal Levin, RN, Maud Moats, MS, ACSM-CEP, Exercise Physiologist;Casey Claudene Idelia Aris BS, ACSM-CEP, Exercise Physiologist    Virtual Visit No    Medication changes reported     No    Fall or balance concerns reported    Yes    Comments unstable gait, uses cane    Tobacco Cessation No Change    Warm-up and Cool-down Performed as group-led instruction     Resistance Training Performed Yes    VAD Patient? No    PAD/SET Patient? No      Pain Assessment   Currently in Pain? No/denies    Pain Score 0-No pain    Multiple Pain Sites No          Capillary Blood Glucose: No results found for this or any previous visit (from the past 24 hours).    Social History   Tobacco Use  Smoking Status Never  Smokeless Tobacco Never    Goals Met:  Exercise tolerated well No report of concerns or symptoms today Strength training completed today  Goals Unmet:  Not Applicable  Comments: Service time is from 1313 to 1445.    Dr. Slater Staff is Medical Director for Pulmonary Rehab at [facility].

## 2024-08-30 ENCOUNTER — Encounter (HOSPITAL_COMMUNITY)
Admission: RE | Admit: 2024-08-30 | Discharge: 2024-08-30 | Disposition: A | Discharge status: Home / Self Care | Source: Ambulatory Visit | Attending: Pulmonary Disease | Admitting: Pulmonary Disease

## 2024-08-30 VITALS — Wt 133.2 lb

## 2024-08-30 DIAGNOSIS — J45909 Unspecified asthma, uncomplicated: Secondary | ICD-10-CM

## 2024-08-30 NOTE — Progress Notes (Signed)
 Daily Session Note  Patient Details  Name: Judy [name] MRN: [id number] Date of Birth: 1946/07/31 Referring Provider:   [facility] Walk Test from 07/25/2024 in [facility]  Referring Provider Dr. Annella    Encounter Date: 08/30/2024  Check In:  Session Check In - 08/30/24 1328       Check-In   Supervising physician immediately available to respond to emergencies [facility] MD immediately available    Physician(s) Orren Fabry, PA    Location [facility]    Staff Present Ronal Levin, RN, Maud Moats, MS, ACSM-CEP, Exercise Physiologist;Casey Claudene Idelia Aris BS, ACSM-CEP, Exercise Physiologist    Virtual Visit No    Medication changes reported     No    Fall or balance concerns reported    Yes    Comments unstable gait, uses cane    Tobacco Cessation No Change    Warm-up and Cool-down Performed as group-led instruction     Resistance Training Performed Yes    VAD Patient? No    PAD/SET Patient? No      Pain Assessment   Currently in Pain? No/denies    Pain Score 0-No pain    Multiple Pain Sites No          Capillary Blood Glucose: No results found for this or any previous visit (from the past 24 hours).    Social History   Tobacco Use  Smoking Status Never  Smokeless Tobacco Never    Goals Met:  Proper associated with RPD/PD & O2 Sat Exercise tolerated well No report of concerns or symptoms today Strength training completed today  Goals Unmet:  Not Applicable  Comments: Service time is from 1306 to 1443.    Dr. Slater Staff is Medical Director for Pulmonary Rehab at [facility].

## 2024-09-04 ENCOUNTER — Telehealth (HOSPITAL_COMMUNITY): Payer: Self-pay

## 2024-09-04 ENCOUNTER — Encounter (HOSPITAL_COMMUNITY): Admission: RE | Admit: 2024-09-04 | Source: Ambulatory Visit

## 2024-09-04 NOTE — Telephone Encounter (Signed)
 Patient c/o for 1:15 PR class due to appt.

## 2024-09-05 NOTE — Progress Notes (Signed)
 Pulmonary Individual Treatment Plan  Patient Details  Name: Judy [name] MRN: [id number] Date of Birth: September 19, 1946 Referring Provider:   Conrad Ports Pulmonary Rehab Walk Test from 07/25/2024 in [facility]  Referring Provider Dr. Annella    Initial Encounter Date:  Flowsheet Row Pulmonary Rehab Walk Test from 07/25/2024 in [facility]  Date 07/25/24    Visit Diagnosis: Asthma, unspecified asthma severity, unspecified whether complicated, unspecified whether persistent  Patient's Home Medications on Admission:   Current Outpatient Medications:    acetaminophen  (TYLENOL ) 325 MG tablet, Take 2 tablets (650 mg total) by mouth every 6 (six) hours as needed for mild pain (or Fever >/= 101)., Disp: , Rfl:    albuterol  (VENTOLIN  HFA) 108 (90 Base) MCG/ACT inhaler, Inhale 2 puffs into the lungs every 6 (six) hours as needed for wheezing or shortness of breath., Disp: 18 each, Rfl: 2   aspirin  EC 81 MG tablet, Take 1 tablet (81 mg total) by mouth daily. Swallow whole., Disp: 90 tablet, Rfl: 3   atorvastatin  (LIPITOR) 10 MG tablet, TAKE 1 TABLET BY MOUTH EVERY DAY (Patient not taking: Reported on 08/14/2024), Disp: 90 tablet, Rfl: 1   b complex vitamins capsule, Take 1 capsule by mouth daily. With C, Disp: , Rfl:    budesonide  (PULMICORT ) 0.5 MG/2ML nebulizer solution, Take 2 mLs (0.5 mg total) by nebulization 2 (two) times daily. DX:J45.40, Disp: 120 mL, Rfl: 12   buPROPion  (WELLBUTRIN  XL) 150 MG 24 hr tablet, TAKE 1 TABLET BY MOUTH EVERY DAY, Disp: 90 tablet, Rfl: 1   cholecalciferol (VITAMIN D3) 25 MCG (1000 UNIT) tablet, Take 1,000 Units by mouth daily., Disp: , Rfl:    INGREZZA  80 MG capsule, Take 1 capsule (80 mg total) by mouth daily., Disp: 30 capsule, Rfl: 5   ipratropium-albuterol  (DUONEB) 0.5-2.5 (3) MG/3ML SOLN, Take 3 mLs by nebulization 3 (three) times daily as  needed. 1 treatment in the morning, 1 treatment in the evenings, every 6 hours as needed in between for shortness of breath or wheezing DX: J45.40, Disp: 270 mL, Rfl: 12   meclizine  (ANTIVERT ) 12.5 MG tablet, Take 1 tablet (12.5 mg total) by mouth 3 (three) times daily as needed for dizziness., Disp: 30 tablet, Rfl: 2   metoprolol  succinate (TOPROL -XL) 25 MG 24 hr tablet, Take 1 tablet (25 mg total) by mouth daily. Take with or immediately following a meal. (Patient not taking: Reported on 08/14/2024), Disp: 90 tablet, Rfl: 3   mirtazapine  (REMERON ) 7.5 MG tablet, Take 0.5 tablets (3.75 mg total) by mouth at bedtime. (Patient not taking: Reported on 08/14/2024), Disp: 45 tablet, Rfl: 1   Omega-3 Fatty Acids (FISH OIL BURP-LESS PO), Take 700 mg by mouth daily., Disp: , Rfl:    omeprazole  (PRILOSEC) 20 MG capsule, Take 1 capsule (20 mg total) by mouth 2 (two) times daily before a meal. (Patient not taking: Reported on 08/14/2024), Disp: 14 capsule, Rfl: 0   polyethylene glycol (MIRALAX  / GLYCOLAX ) 17 g packet, Take 17 g by mouth daily., Disp: 30 each, Rfl: 2   propranolol  (INDERAL ) 10 MG tablet, TAKE 1 TABLET BY MOUTH THREE TIMES A DAY AS NEEDED (Patient not taking: Reported on 08/14/2024), Disp: 270 tablet, Rfl: 2  Past Medical History: Past Medical History:  Diagnosis Date   Allergic rhinitis    Anemia    Anxiety    Arthritis    Bipolar 1 disorder (  HCC)    Cancer (HCC)    Depression    Diverticulosis of colon (without mention of hemorrhage) 08/25/2011   Dr. Burnette   Dyspnea    Endometrial cancer [facility]) 04/06/2023   Generalized weakness 07/04/2023   Hearing loss in left ear    since birth   Hyperlipidemia    Hypertension    resolved   Insomnia    Obesity    Palpitation    Renal disorder    Tardive dyskinesia    Tremor    Vertigo     Tobacco Use: Social History   Tobacco Use  Smoking Status Never  Smokeless Tobacco Never    Labs: Review Flowsheet  More data exists       Latest Ref Rng & Units 03/02/2022 11/04/2022 11/08/2023 05/08/2024 08/14/2024  Labs for ITP Cardiac and Pulmonary Rehab  Cholestrol 100 - 199 mg/dL 843  848  828  786  804   LDL (calc) 0 - 99 mg/dL 71  63  83  873  899   HDL-C >39 mg/dL 70  72  72  74  82   Trlycerides 0 - 149 mg/dL 77  86  86  76  74   Hemoglobin A1c 4.8 - 5.6 % 5.5  5.6  5.5  - 5.3     Capillary Blood Glucose: Lab Results  Component Value Date   GLUCAP 108 (H) 04/20/2022   GLUCAP 79 11/08/2019   GLUCAP 96 12/10/2013   GLUCAP 98 08/25/2011     Pulmonary Assessment Scores:  Pulmonary Assessment Scores     Row Name 07/25/24 1303         ADL UCSD   ADL Phase Entry     SOB Score total 69       CAT Score   CAT Score 26       mMRC Score   mMRC Score 4       UCSD: Self-administered rating of dyspnea associated with activities of daily living (ADLs) 6-point scale (0 = not at all to 5 = maximal or unable to do because of breathlessness)  Scoring Scores range from 0 to 120.  Minimally important difference is 5 units  CAT: CAT can identify the health impairment of COPD patients and is better correlated with disease progression.  CAT has a scoring range of zero to 40. The CAT score is classified into four groups of low (less than 10), medium (10 - 20), high (21-30) and very high (31-40) based on the impact level of disease on health status. A CAT score over 10 suggests significant symptoms.  A worsening CAT score could be explained by an exacerbation, poor medication adherence, poor inhaler technique, or progression of COPD or comorbid conditions.  CAT MCID is 2 points  mMRC: mMRC (Modified Medical Research Council) Dyspnea Scale is used to assess the degree of baseline functional disability in patients of respiratory disease due to dyspnea. No minimal important difference is established. A decrease in score of 1 point or greater is considered a positive change.   Pulmonary Function Assessment:  Pulmonary  Function Assessment - 07/25/24 1302       Breath   Bilateral Breath Sounds Clear    Shortness of Breath Yes;Limiting activity          Exercise Target Goals: Exercise Program Goal: Individual exercise prescription set using results from initial 6 min walk test and THRR while considering  patient's activity barriers and safety.   Exercise Prescription Goal: Initial  exercise prescription builds to 30-45 minutes a day of aerobic activity, 2-3 days per week.  Home exercise guidelines will be given to patient during program as part of exercise prescription that the participant will acknowledge.  Activity Barriers & Risk Stratification:  Activity Barriers & Cardiac Risk Stratification - 07/25/24 1301       Activity Barriers & Cardiac Risk Stratification   Activity Barriers Deconditioning;Muscular Weakness;Shortness of Breath;Back Problems;History of Falls;Balance Concerns          6 Minute Walk:  6 Minute Walk     Row Name 07/25/24 1234         6 Minute Walk   Phase Initial     Distance 600 feet     Walk Time 6 minutes     # of Rest Breaks 1     MPH 1.14     METS 1.96     RPE 13     Perceived Dyspnea  1     VO2 Peak 6.86     Symptoms No     Resting HR 73 bpm     Resting BP 130/70     Resting Oxygen Saturation  98 %     Exercise Oxygen Saturation  during 6 min walk 95 %     Max Ex. HR 107 bpm     Max Ex. BP 132/80     2 Minute Post BP 122/80       Interval HR   1 Minute HR 102     2 Minute HR 106     3 Minute HR 107     4 Minute HR 107     5 Minute HR 99     6 Minute HR 76     2 Minute Post HR 68     Interval Heart Rate? Yes       Interval Oxygen   Interval Oxygen? Yes     Baseline Oxygen Saturation % 98 %     1 Minute Oxygen Saturation % 95 %     1 Minute Liters of Oxygen 0 L     2 Minute Oxygen Saturation % 96 %     2 Minute Liters of Oxygen 0 L     3 Minute Oxygen Saturation % 98 %     3 Minute Liters of Oxygen 0 L     4 Minute Oxygen Saturation %  99 %     4 Minute Liters of Oxygen 0 L     5 Minute Oxygen Saturation % 99 %     5 Minute Liters of Oxygen 0 L     6 Minute Oxygen Saturation % 99 %     6 Minute Liters of Oxygen 0 L     2 Minute Post Oxygen Saturation % 98 %     2 Minute Post Liters of Oxygen 0 L        Oxygen Initial Assessment:  Oxygen Initial Assessment - 07/25/24 1302       Home Oxygen   Home Oxygen Device None    Sleep Oxygen Prescription None    Home Exercise Oxygen Prescription None    Home Resting Oxygen Prescription None      Initial 6 min Walk   Oxygen Used None      Program Oxygen Prescription   Program Oxygen Prescription None      Intervention   Short Term Goals To learn and understand importance of maintaining oxygen saturations>88%;To learn and demonstrate proper  use of respiratory medications;To learn and demonstrate proper pursed lip breathing techniques or other breathing techniques. ;To learn and understand importance of monitoring SPO2 with pulse oximeter and demonstrate accurate use of the pulse oximeter.;To learn and exhibit compliance with exercise, home and travel O2 prescription    Long  Term Goals Exhibits compliance with exercise, home  and travel O2 prescription;Maintenance of O2 saturations>88%;Compliance with respiratory medication;Demonstrates proper use of MDI's;Exhibits proper breathing techniques, such as pursed lip breathing or other method taught during program session;Verbalizes importance of monitoring SPO2 with pulse oximeter and return demonstration          Oxygen Re-Evaluation:  Oxygen Re-Evaluation     Row Name 08/01/24 1451 08/30/24 0908           Program Oxygen Prescription   Program Oxygen Prescription None None        Home Oxygen   Home Oxygen Device None None      Sleep Oxygen Prescription None None      Home Exercise Oxygen Prescription None None      Home Resting Oxygen Prescription None None        Goals/Expected Outcomes   Short Term Goals To  learn and understand importance of maintaining oxygen saturations>88%;To learn and demonstrate proper use of respiratory medications;To learn and demonstrate proper pursed lip breathing techniques or other breathing techniques. ;To learn and understand importance of monitoring SPO2 with pulse oximeter and demonstrate accurate use of the pulse oximeter.;To learn and exhibit compliance with exercise, home and travel O2 prescription To learn and understand importance of maintaining oxygen saturations>88%;To learn and demonstrate proper use of respiratory medications;To learn and demonstrate proper pursed lip breathing techniques or other breathing techniques. ;To learn and understand importance of monitoring SPO2 with pulse oximeter and demonstrate accurate use of the pulse oximeter.;To learn and exhibit compliance with exercise, home and travel O2 prescription      Long  Term Goals Exhibits compliance with exercise, home  and travel O2 prescription;Maintenance of O2 saturations>88%;Compliance with respiratory medication;Demonstrates proper use of MDI's;Exhibits proper breathing techniques, such as pursed lip breathing or other method taught during program session;Verbalizes importance of monitoring SPO2 with pulse oximeter and return demonstration Exhibits compliance with exercise, home  and travel O2 prescription;Maintenance of O2 saturations>88%;Compliance with respiratory medication;Demonstrates proper use of MDI's;Exhibits proper breathing techniques, such as pursed lip breathing or other method taught during program session;Verbalizes importance of monitoring SPO2 with pulse oximeter and return demonstration      Goals/Expected Outcomes Compliance and understanding of oxygen saturation monitoring and breathing techniques to decrease shortness of breath. Compliance and understanding of oxygen saturation monitoring and breathing techniques to decrease shortness of breath.         Oxygen Discharge (Final  Oxygen Re-Evaluation):  Oxygen Re-Evaluation - 08/30/24 0908       Program Oxygen Prescription   Program Oxygen Prescription None      Home Oxygen   Home Oxygen Device None    Sleep Oxygen Prescription None    Home Exercise Oxygen Prescription None    Home Resting Oxygen Prescription None      Goals/Expected Outcomes   Short Term Goals To learn and understand importance of maintaining oxygen saturations>88%;To learn and demonstrate proper use of respiratory medications;To learn and demonstrate proper pursed lip breathing techniques or other breathing techniques. ;To learn and understand importance of monitoring SPO2 with pulse oximeter and demonstrate accurate use of the pulse oximeter.;To learn and exhibit compliance with exercise, home and travel O2  prescription    Long  Term Goals Exhibits compliance with exercise, home  and travel O2 prescription;Maintenance of O2 saturations>88%;Compliance with respiratory medication;Demonstrates proper use of MDI's;Exhibits proper breathing techniques, such as pursed lip breathing or other method taught during program session;Verbalizes importance of monitoring SPO2 with pulse oximeter and return demonstration    Goals/Expected Outcomes Compliance and understanding of oxygen saturation monitoring and breathing techniques to decrease shortness of breath.          Initial Exercise Prescription:  Initial Exercise Prescription - 07/25/24 1200       Date of Initial Exercise RX and Referring Provider   Date 07/25/24    Referring Provider Dr. Annella    Expected Discharge Date 10/30/24      NuStep   Level 1    SPM 50    Minutes 30    METs 1.5      Prescription Details   Frequency (times per week) 2    Duration Progress to 30 minutes of continuous aerobic without signs/symptoms of physical distress      Intensity   THRR 40-80% of Max Heartrate 57-114    Ratings of Perceived Exertion 11-13    Perceived Dyspnea 0-4      Progression    Progression Continue to progress workloads to maintain intensity without signs/symptoms of physical distress.      Resistance Training   Training Prescription Yes    Weight yellow bands    Reps 10-15          Perform Capillary Blood Glucose checks as needed.  Exercise Prescription Changes:   Exercise Prescription Changes     Row Name 08/21/24 1500 08/30/24 1521           Response to Exercise   Blood Pressure (Admit) 140/90 128/82      Blood Pressure (Exercise) 140/86 --      Blood Pressure (Exit) 120/70 124/80      Heart Rate (Admit) 75 bpm 69 bpm      Heart Rate (Exercise) 85 bpm 72 bpm      Heart Rate (Exit) 73 bpm 60 bpm      Oxygen Saturation (Admit) 98 % 98 %      Oxygen Saturation (Exercise) 99 % 97 %      Oxygen Saturation (Exit) 96 % 97 %      Rating of Perceived Exertion (Exercise) 13 11      Perceived Dyspnea (Exercise) 2 2      Duration Continue with 30 min of aerobic exercise without signs/symptoms of physical distress. Continue with 30 min of aerobic exercise without signs/symptoms of physical distress.      Intensity THRR unchanged THRR unchanged        Progression   Progression Continue to progress workloads to maintain intensity without signs/symptoms of physical distress. Continue to progress workloads to maintain intensity without signs/symptoms of physical distress.        Resistance Training   Training Prescription Yes Yes      Weight yellow bands yellow bands      Reps 10-15 10-15      Time 10 Minutes 10 Minutes        NuStep   Level 1 1      SPM 28 38      Minutes 30 30      METs 1.2 1.2         Exercise Comments:   Exercise Goals and Review:   Exercise Goals  Row Name 07/25/24 1302             Exercise Goals   Increase Physical Activity Yes       Intervention Provide advice, education, support and counseling about physical activity/exercise needs.;Develop an individualized exercise prescription for aerobic and resistive  training based on initial evaluation findings, risk stratification, comorbidities and participant's personal goals.       Expected Outcomes Short Term: Attend rehab on a regular basis to increase amount of physical activity.;Long Term: Exercising regularly at least 3-5 days a week.;Long Term: Add in home exercise to make exercise part of routine and to increase amount of physical activity.       Increase Strength and Stamina Yes       Intervention Provide advice, education, support and counseling about physical activity/exercise needs.;Develop an individualized exercise prescription for aerobic and resistive training based on initial evaluation findings, risk stratification, comorbidities and participant's personal goals.       Expected Outcomes Short Term: Increase workloads from initial exercise prescription for resistance, speed, and METs.;Short Term: Perform resistance training exercises routinely during rehab and add in resistance training at home;Long Term: Improve cardiorespiratory fitness, muscular endurance and strength as measured by increased METs and functional capacity ( )       Able to understand and use rate of perceived exertion (RPE) scale Yes       Intervention Provide education and explanation on how to use RPE scale       Expected Outcomes Short Term: Able to use RPE daily in rehab to express subjective intensity level;Long Term:  Able to use RPE to guide intensity level when exercising independently       Able to understand and use Dyspnea scale Yes       Intervention Provide education and explanation on how to use Dyspnea scale       Expected Outcomes Short Term: Able to use Dyspnea scale daily in rehab to express subjective sense of shortness of breath during exertion;Long Term: Able to use Dyspnea scale to guide intensity level when exercising independently       Knowledge and understanding of Target Heart Rate Range (THRR) Yes       Intervention Provide education and  explanation of THRR including how the numbers were predicted and where they are located for reference       Expected Outcomes Short Term: Able to state/look up THRR;Short Term: Able to use daily as guideline for intensity in rehab;Long Term: Able to use THRR to govern intensity when exercising independently       Understanding of Exercise Prescription Yes       Intervention Provide education, explanation, and written materials on patient's individual exercise prescription       Expected Outcomes Short Term: Able to explain program exercise prescription;Long Term: Able to explain home exercise prescription to exercise independently          Exercise Goals Re-Evaluation :  Exercise Goals Re-Evaluation     Row Name 08/01/24 1449 08/30/24 0906           Exercise Goal Re-Evaluation   Exercise Goals Review Increase Physical Activity;Able to understand and use Dyspnea scale;Understanding of Exercise Prescription;Increase Strength and Stamina;Knowledge and understanding of Target Heart Rate Range (THRR);Able to understand and use rate of perceived exertion (RPE) scale Increase Physical Activity;Able to understand and use Dyspnea scale;Understanding of Exercise Prescription;Increase Strength and Stamina;Knowledge and understanding of Target Heart Rate Range (THRR);Able to understand and use rate of perceived exertion (  RPE) scale      Comments Pt to begin exercise with pulmonary rehab when she has completed home health. Will progress as tolerated. Pt has completed 3 exercise sessions. She is exercising on the recumbent stepper for 30 min, level 1, METs 1.2. She has not shown any progression yet and moves quite slowly (due to dementia), SPM 33. Will progress as tolerated. She performs warm up and cool down standing with yellow bands, 3 lbs. She needs demonstrative cues at times. She performs squats with support. She is consistant with attendance.      Expected Outcomes Through exercise at rehab and home, the  patient will decrease shortness of breath with daily activities and feel confident in carrying out an exercise regimen at home. Through exercise at rehab and home, the patient will decrease shortness of breath with daily activities and feel confident in carrying out an exercise regimen at home.         Discharge Exercise Prescription (Final Exercise Prescription Changes):  Exercise Prescription Changes - 08/30/24 1521       Response to Exercise   Blood Pressure (Admit) 128/82    Blood Pressure (Exit) 124/80    Heart Rate (Admit) 69 bpm    Heart Rate (Exercise) 72 bpm    Heart Rate (Exit) 60 bpm    Oxygen Saturation (Admit) 98 %    Oxygen Saturation (Exercise) 97 %    Oxygen Saturation (Exit) 97 %    Rating of Perceived Exertion (Exercise) 11    Perceived Dyspnea (Exercise) 2    Duration Continue with 30 min of aerobic exercise without signs/symptoms of physical distress.    Intensity THRR unchanged      Progression   Progression Continue to progress workloads to maintain intensity without signs/symptoms of physical distress.      Resistance Training   Training Prescription Yes    Weight yellow bands    Reps 10-15    Time 10 Minutes      NuStep   Level 1    SPM 38    Minutes 30    METs 1.2          Nutrition:  Target Goals: Understanding of nutrition guidelines, daily intake of sodium 1500mg , cholesterol 200mg , calories 30% from fat and 7% or less from saturated fats, daily to have 5 or more servings of fruits and vegetables.  Biometrics:    Nutrition Therapy Plan and Nutrition Goals:   Nutrition Assessments:  MEDIFICTS Score Key: >=70 Need to make dietary changes  40-70 Heart Healthy Diet <= 40 Therapeutic Level Cholesterol Diet   Picture Your Plate Scores: <59 Unhealthy dietary pattern with much room for improvement. 41-50 Dietary pattern unlikely to meet recommendations for good health and room for improvement. 51-60 More healthful dietary pattern,  with some room for improvement.  >60 Healthy dietary pattern, although there may be some specific behaviors that could be improved.    Nutrition Goals Re-Evaluation:   Nutrition Goals Discharge (Final Nutrition Goals Re-Evaluation):   Psychosocial: Target Goals: Acknowledge presence or absence of significant depression and/or stress, maximize coping skills, provide positive support system. Participant is able to verbalize types and ability to use techniques and skills needed for reducing stress and depression.  Initial Review & Psychosocial Screening:  Initial Psych Review & Screening - 07/25/24 1303       Initial Review   Current issues with History of Depression;Current Psychotropic Meds      Family Dynamics   Good Support System? Yes  Comments Pt has mild cognitive impairment. Her brother, Victory, and his wife Merlynn assist pt. She lives alone, no longer drives. Sees therapist regulary for medication management, hx of bipolar, depression      Barriers   Psychosocial barriers to participate in program There are no identifiable barriers or psychosocial needs.      Screening Interventions   Interventions Encouraged to exercise    Expected Outcomes Short Term goal: Utilizing psychosocial counselor, staff and physician to assist with identification of specific Stressors or current issues interfering with healing process. Setting desired goal for each stressor or current issue identified.;Long Term Goal: Stressors or current issues are controlled or eliminated.;Short Term goal: Identification and review with participant of any Quality of Life or Depression concerns found by scoring the questionnaire.;Long Term goal: The participant improves quality of Life and PHQ9 Scores as seen by post scores and/or verbalization of changes          Quality of Life Scores:  Scores of 19 and below usually indicate a poorer quality of life in these areas.  A difference of  2-3 points is a clinically  meaningful difference.  A difference of 2-3 points in the total score of the Quality of Life Index has been associated with significant improvement in overall quality of life, self-image, physical symptoms, and general health in studies assessing change in quality of life.  PHQ-9: Review Flowsheet  More data exists      08/14/2024 12/28/2023 11/08/2023 07/04/2023 12/16/2022  Depression screen PHQ 2/9  Decreased Interest 0 0 2 3 0  Down, Depressed, Hopeless 1 1 2 3  0  PHQ - 2 Score 1 1 4 6  0  Altered sleeping 1 1 0 2 0  Tired, decreased energy 1 1 3 3 2   Change in appetite 0 0 3 3 0  Feeling bad or failure about yourself  0 0 1 3 0  Trouble concentrating 0 0 3 1 0  Moving slowly or fidgety/restless 0 0 0 2 0  Suicidal thoughts 0 0 0 0 0  PHQ-9 Score 3  3  14  20  2    Difficult doing work/chores - Not difficult at all Somewhat difficult - -    Details       Data saved with a previous flowsheet row definition        Interpretation of Total Score  Total Score Depression Severity:  1-4 = Minimal depression, 5-9 = Mild depression, 10-14 = Moderate depression, 15-19 = Moderately severe depression, 20-27 = Severe depression   Psychosocial Evaluation and Intervention:  Psychosocial Evaluation - 07/25/24 1305       Psychosocial Evaluation & Interventions   Interventions Encouraged to exercise with the program and follow exercise prescription    Comments Pt has mild cognitive impairment. Her brother, Victory, and his wife Merlynn assist pt. She lives alone, no longer drives. Sees therapist regulary for medication management, hx of bipolar, depression    Expected Outcomes For Julizza to participate in PR free of any psy/soc barriers or concerns    Continue Psychosocial Services  Follow up required by staff          Psychosocial Re-Evaluation:  Psychosocial Re-Evaluation     Row Name 08/03/24 1419 09/03/24 1034           Psychosocial Re-Evaluation   Current issues with History of  Depression;Current Psychotropic Meds History of Depression;Current Psychotropic Meds      Comments Monthly psychosocial re-evaluation as follows: No changes since assessment.  Syliva has not started the program yet. She is waiting to finish home PT first. Monthly psychosocial re-evaluation as follows: Synia has started the program and is making new friends. She stated that she likes coming, states exercising is "okay". She denies any psy/soc barriers or concerns. She states she has good support from her brother and her sister-in-law.      Expected Outcomes For Brunette to participate in PR free of any psy/soc barriers or concerns For Joleena to participate in PR free of any psy/soc barriers or concerns      Interventions Encouraged to attend Pulmonary Rehabilitation for the exercise Encouraged to attend Pulmonary Rehabilitation for the exercise      Continue Psychosocial Services  Follow up required by staff Follow up required by staff         Psychosocial Discharge (Final Psychosocial Re-Evaluation):  Psychosocial Re-Evaluation - 09/03/24 1034       Psychosocial Re-Evaluation   Current issues with History of Depression;Current Psychotropic Meds    Comments Monthly psychosocial re-evaluation as follows: Betha has started the program and is making new friends. She stated that she likes coming, states exercising is "okay". She denies any psy/soc barriers or concerns. She states she has good support from her brother and her sister-in-law.    Expected Outcomes For Kinzlee to participate in PR free of any psy/soc barriers or concerns    Interventions Encouraged to attend Pulmonary Rehabilitation for the exercise    Continue Psychosocial Services  Follow up required by staff          Education: Education Goals: Education classes will be provided on a weekly basis, covering required topics. Participant will state understanding/return demonstration of topics presented.  Learning  Barriers/Preferences:  Learning Barriers/Preferences - 07/25/24 1306       Learning Barriers/Preferences   Learning Barriers Inability to learn new things;Exercise Concerns;Reading    Learning Preferences Individual Instruction;Verbal Instruction;Written Material          Education Topics: Know Your Numbers Group instruction that is supported by a PowerPoint presentation. Instructor discusses importance of knowing and understanding resting, exercise, and post-exercise oxygen saturation, heart rate, and blood pressure. Oxygen saturation, heart rate, blood pressure, rating of perceived exertion, and dyspnea are reviewed along with a normal range for these values.    Exercise for the Pulmonary Patient Group instruction that is supported by a PowerPoint presentation. Instructor discusses benefits of exercise, core components of exercise, frequency, duration, and intensity of an exercise routine, importance of utilizing pulse oximetry during exercise, safety while exercising, and options of places to exercise outside of rehab.    MET Level  Group instruction provided by PowerPoint, verbal discussion, and written material to support subject matter. Instructor reviews what METs are and how to increase METs.    Pulmonary Medications Verbally interactive group education provided by instructor with focus on inhaled medications and proper administration.   Anatomy and Physiology of the Respiratory System Group instruction provided by PowerPoint, verbal discussion, and written material to support subject matter. Instructor reviews respiratory cycle and anatomical components of the respiratory system and their functions. Instructor also reviews differences in obstructive and restrictive respiratory diseases with examples of each.  Flowsheet Row PULMONARY REHAB OTHER RESPIRATORY from 08/30/2024 in [facility] for Heart, Vascular, & Lung Health  Date 08/30/24  Educator RT   Instruction Review Code 1- Verbalizes Understanding    Oxygen Safety Group instruction provided by PowerPoint, verbal discussion, and written material to support subject matter.  There is an overview of "What is Oxygen" and "Why do we need it".  Instructor also reviews how to create a safe environment for oxygen use, the importance of using oxygen as prescribed, and the risks of noncompliance. There is a brief discussion on traveling with oxygen and resources the patient may utilize.   Oxygen Use Group instruction provided by PowerPoint, verbal discussion, and written material to discuss how supplemental oxygen is prescribed and different types of oxygen supply systems. Resources for more information are provided.    Breathing Techniques Group instruction that is supported by demonstration and informational handouts. Instructor discusses the benefits of pursed lip and diaphragmatic breathing and detailed demonstration on how to perform both.     Risk Factor Reduction Group instruction that is supported by a PowerPoint presentation. Instructor discusses the definition of a risk factor, different risk factors for pulmonary disease, and how the heart and lungs work together.   Pulmonary Diseases Group instruction provided by PowerPoint, verbal discussion, and written material to support subject matter. Instructor gives an overview of the different type of pulmonary diseases. There is also a discussion on risk factors and symptoms as well as ways to manage the diseases. Flowsheet Row PULMONARY REHAB OTHER RESPIRATORY from 08/23/2024 in [facility] for Heart, Vascular, & Lung Health  Date 08/23/24  Educator RT  Instruction Review Code 1- Verbalizes Understanding    Stress and Energy Conservation Group instruction provided by PowerPoint, verbal discussion, and written material to support subject matter. Instructor gives an overview of stress and the impact it can have  on the body. Instructor also reviews ways to reduce stress. There is also a discussion on energy conservation and ways to conserve energy throughout the day.   Warning Signs and Symptoms Group instruction provided by PowerPoint, verbal discussion, and written material to support subject matter. Instructor reviews warning signs and symptoms of stroke, heart attack, cold and flu. Instructor also reviews ways to prevent the spread of infection.   Other Education Group or individual verbal, written, or video instructions that support the educational goals of the pulmonary rehab program.    Knowledge Questionnaire Score:  Knowledge Questionnaire Score - 07/25/24 1306       Knowledge Questionnaire Score   Pre Score --   unable to complete d/t cognitive impairment         Core Components/Risk Factors/Patient Goals at Admission:  Personal Goals and Risk Factors at Admission - 07/25/24 1307       Core Components/Risk Factors/Patient Goals on Admission   Improve shortness of breath with ADL's Yes    Intervention Provide education, individualized exercise plan and daily activity instruction to help decrease symptoms of SOB with activities of daily living.    Expected Outcomes Short Term: Improve cardiorespiratory fitness to achieve a reduction of symptoms when performing ADLs;Long Term: Be able to perform more ADLs without symptoms or delay the onset of symptoms    Increase knowledge of respiratory medications and ability to use respiratory devices properly  Yes    Intervention Provide education and demonstration as needed of appropriate use of medications, inhalers, and oxygen therapy.    Expected Outcomes Short Term: Achieves understanding of medications use. Understands that oxygen is a medication prescribed by physician. Demonstrates appropriate use of inhaler and oxygen therapy.;Long Term: Maintain appropriate use of medications, inhalers, and oxygen therapy.          Core  Components/Risk Factors/Patient Goals Review:   Goals and Risk Factor Review  Row Name 08/03/24 1421 09/03/24 1043           Core Components/Risk Factors/Patient Goals Review   Personal Goals Review Improve shortness of breath with ADL's;Develop more efficient breathing techniques such as purse lipped breathing and diaphragmatic breathing and practicing self-pacing with activity.;Increase knowledge of respiratory medications and ability to use respiratory devices properly. Improve shortness of breath with ADL's;Develop more efficient breathing techniques such as purse lipped breathing and diaphragmatic breathing and practicing self-pacing with activity.;Increase knowledge of respiratory medications and ability to use respiratory devices properly.      Review Monthly review of patient's Core Components/Risk Factors/Patient Goals are as follows: Unable to assess. Evadean has not started the program yet. She is due to start after she finishes HH PT. Monthly review of patient's Core Components/Risk Factors/Patient Goals are as follows:  Goal in progress for improving her shortness of breath with ADLs. Skie is trying to build up her strength and endurance. She has completed 4 sessions so far and is exercising on the NuStep for 30 minutes. Her oxygen saturation has been in a normal range on room air with exertion. Goal progressing for developing more efficient breathing techniques such as purse lipped breathing and diaphragmatic breathing; and practicing self-pacing with activity. Syliva needs to be prompted to perform purse lipped breathing while short of breath. We work on this with her while performing the warmup and while exercising. She is working on diaphragmatic breathing at home. Goal met on increasing her knowledge of respiratory medications and the ability to use her devices correctly. Nikiya worked with our respiratory therapist on how to properly use her inhalers. Staff have discussed how to use,  when to use, side effects and contraindications with her. All questions were answered, and she understood without assistance. Kahliyah will continue to benefit from PR for nutrition, education, exercise, and lifestyle modification.      Expected Outcomes Pt will show progress toward meeting expected goals and outcomes. Pt will show progress toward meeting expected goals and outcomes.         Core Components/Risk Factors/Patient Goals at Discharge (Final Review):   Goals and Risk Factor Review - 09/03/24 1043       Core Components/Risk Factors/Patient Goals Review   Personal Goals Review Improve shortness of breath with ADL's;Develop more efficient breathing techniques such as purse lipped breathing and diaphragmatic breathing and practicing self-pacing with activity.;Increase knowledge of respiratory medications and ability to use respiratory devices properly.    Review Monthly review of patient's Core Components/Risk Factors/Patient Goals are as follows:  Goal in progress for improving her shortness of breath with ADLs. Perlita is trying to build up her strength and endurance. She has completed 4 sessions so far and is exercising on the NuStep for 30 minutes. Her oxygen saturation has been in a normal range on room air with exertion. Goal progressing for developing more efficient breathing techniques such as purse lipped breathing and diaphragmatic breathing; and practicing self-pacing with activity. Syliva needs to be prompted to perform purse lipped breathing while short of breath. We work on this with her while performing the warmup and while exercising. She is working on diaphragmatic breathing at home. Goal met on increasing her knowledge of respiratory medications and the ability to use her devices correctly. Chrisie worked with our respiratory therapist on how to properly use her inhalers. Staff have discussed how to use, when to use, side effects and contraindications with her. All questions were  answered, and she understood without assistance.  Keaton will continue to benefit from PR for nutrition, education, exercise, and lifestyle modification.    Expected Outcomes Pt will show progress toward meeting expected goals and outcomes.          ITP Comments: Pt is making expected progress toward Pulmonary Rehab goals after completing 4 session(s). Recommend continued exercise, life style modification, education, and utilization of breathing techniques to increase stamina and strength, while also decreasing shortness of breath with exertion.  Dr. Slater Staff is Medical Director for Pulmonary Rehab at [facility].

## 2024-09-11 ENCOUNTER — Encounter (HOSPITAL_COMMUNITY)
Admission: RE | Admit: 2024-09-11 | Discharge: 2024-09-11 | Disposition: A | Discharge status: Home / Self Care | Source: Ambulatory Visit | Attending: Pulmonary Disease | Admitting: Pulmonary Disease

## 2024-09-11 DIAGNOSIS — J45909 Unspecified asthma, uncomplicated: Secondary | ICD-10-CM | POA: Diagnosis present

## 2024-09-11 NOTE — Progress Notes (Signed)
 Daily Session Note  Patient Details  Name: Judy [name] MRN: [id number] Date of Birth: 03-Jul-1946 Referring Provider:   Conrad Ports Pulmonary Rehab Walk Test from 07/25/2024 in [facility] for Heart, Vascular, & Lung Health  Referring Provider Dr. Annella    Encounter Date: 09/11/2024  Check In:  Session Check In - 09/11/24 1327       Check-In   Supervising physician immediately available to respond to emergencies [facility] MD immediately available    Physician(s) Damien Braver, NP    Location [facility]    Staff Present Ronal Levin, RN, Maud Moats, MS, ACSM-CEP, Exercise Physiologist;Lilienne Weins Claudene Idelia Aris BS, ACSM-CEP, Exercise Physiologist    Virtual Visit No    Medication changes reported     No    Fall or balance concerns reported    Yes    Comments unstable gait, uses cane    Tobacco Cessation No Change    Warm-up and Cool-down Performed as group-led instruction     Resistance Training Performed Yes    VAD Patient? No    PAD/SET Patient? No      Pain Assessment   Currently in Pain? No/denies    Multiple Pain Sites No          Capillary Blood Glucose: No results found for this or any previous visit (from the past 24 hours).    Social History   Tobacco Use  Smoking Status Never  Smokeless Tobacco Never    Goals Met:  Proper associated with RPD/PD & O2 Sat Independence with exercise equipment Exercise tolerated well No report of concerns or symptoms today Strength training completed today  Goals Unmet:  Not Applicable  Comments: Service time is from 1306 to 1428.    Dr. Slater Staff is Medical Director for Pulmonary Rehab at [facility].

## 2024-09-13 ENCOUNTER — Encounter (HOSPITAL_COMMUNITY)
Admission: RE | Admit: 2024-09-13 | Discharge: 2024-09-13 | Disposition: A | Source: Ambulatory Visit | Attending: Pulmonary Disease | Admitting: Pulmonary Disease

## 2024-09-13 VITALS — Wt 136.9 lb

## 2024-09-13 DIAGNOSIS — J45909 Unspecified asthma, uncomplicated: Secondary | ICD-10-CM

## 2024-09-13 NOTE — Progress Notes (Signed)
 Daily Session Note  Patient Details  Name: Judy [name] MRN: [id number] Date of Birth: 1945-12-02 Referring Provider:   Conrad Ports Pulmonary Rehab Walk Test from 07/25/2024 in [facility] for Heart, Vascular, & Lung Health  Referring Provider Dr. Annella    Encounter Date: 09/13/2024  Check In:  Session Check In - 09/13/24 1329       Check-In   Supervising physician immediately available to respond to emergencies [facility] MD immediately available    Physician(s) Rosaline Bane, NP    Location [facility]    Staff Present Ronal Levin, RN, Maud Moats, MS, ACSM-CEP, Exercise Physiologist;Felisha Claytor Claudene Idelia Aris BS, ACSM-CEP, Exercise Physiologist    Virtual Visit No    Medication changes reported     No    Fall or balance concerns reported    Yes    Comments unstable gait, uses cane    Tobacco Cessation No Change    Warm-up and Cool-down Performed as group-led instruction     Resistance Training Performed Yes    VAD Patient? No    PAD/SET Patient? No      Pain Assessment   Currently in Pain? No/denies    Pain Score 0-No pain    Multiple Pain Sites No          Capillary Blood Glucose: No results found for this or any previous visit (from the past 24 hours).    Social History   Tobacco Use  Smoking Status Never  Smokeless Tobacco Never    Goals Met:  Proper associated with RPD/PD & O2 Sat Independence with exercise equipment Exercise tolerated well No report of concerns or symptoms today Strength training completed today  Goals Unmet:  Not Applicable  Comments: Service time is from 1304 to 1445.    Dr. Slater Staff is Medical Director for Pulmonary Rehab at [facility].

## 2024-09-18 ENCOUNTER — Encounter (HOSPITAL_COMMUNITY): Admission: RE | Admit: 2024-09-18 | Source: Ambulatory Visit

## 2024-09-18 ENCOUNTER — Telehealth (HOSPITAL_COMMUNITY): Payer: Self-pay

## 2024-09-18 NOTE — Telephone Encounter (Signed)
 Patient c/o for 1:15 PR class, no reason given.

## 2024-09-20 ENCOUNTER — Encounter (HOSPITAL_COMMUNITY)
Admission: RE | Admit: 2024-09-20 | Discharge: 2024-09-20 | Disposition: A | Discharge status: Home / Self Care | Source: Ambulatory Visit | Attending: Pulmonary Disease | Admitting: Pulmonary Disease

## 2024-09-20 DIAGNOSIS — J45909 Unspecified asthma, uncomplicated: Secondary | ICD-10-CM | POA: Diagnosis not present

## 2024-09-20 NOTE — Progress Notes (Signed)
 Daily Session Note  Patient Details  Name: Judy [name] MRN: [id number] Date of Birth: May 24, 1946 Referring Provider:   Conrad Ports Pulmonary Rehab Walk Test from 07/25/2024 in [facility] for Heart, Vascular, & Lung Health  Referring Provider Dr. Annella    Encounter Date: 09/20/2024  Check In:  Session Check In - 09/20/24 1508       Check-In   Supervising physician immediately available to respond to emergencies [facility] MD immediately available    Physician(s) Lum Louis, NP    Location [facility]    Staff Present Ronal Levin, RN, Maud Moats, MS, ACSM-CEP, Exercise Physiologist;Labrisha Wuellner Claudene, RT;Randi Reeve BS, ACSM-CEP, Exercise Physiologist    Virtual Visit No    Medication changes reported     No    Fall or balance concerns reported    Yes    Comments unstable gait, uses cane    Tobacco Cessation No Change    Warm-up and Cool-down Performed as group-led instruction     Resistance Training Performed Yes    VAD Patient? No    PAD/SET Patient? No      Pain Assessment   Currently in Pain? No/denies    Multiple Pain Sites No          Capillary Blood Glucose: No results found for this or any previous visit (from the past 24 hours).    Tobacco Use History[1]  Goals Met:  Proper associated with RPD/PD & O2 Sat Independence with exercise equipment Exercise tolerated well No report of concerns or symptoms today Strength training completed today  Goals Unmet:  Not Applicable  Comments: Service time is from 1309 to 1443.    Dr. Slater Staff is Medical Director for Pulmonary Rehab at [facility].     [1]  Social History Tobacco Use  Smoking Status Never  Smokeless Tobacco Never

## 2024-09-25 ENCOUNTER — Ambulatory Visit: Admitting: Pulmonary Disease

## 2024-09-25 ENCOUNTER — Encounter (HOSPITAL_COMMUNITY)

## 2024-09-25 ENCOUNTER — Encounter: Payer: Self-pay | Admitting: Pulmonary Disease

## 2024-09-25 VITALS — BP 132/74 | HR 59 | Temp 96.5°F | Ht 69.0 in | Wt 137.4 lb

## 2024-09-25 DIAGNOSIS — R0609 Other forms of dyspnea: Secondary | ICD-10-CM

## 2024-09-25 NOTE — Progress Notes (Signed)
 @Patient  ID: Judy [name], female    DOB: [birth year], 78 y.o.   MRN: [id number]  Chief Complaint  Patient presents with   Medical Management of Chronic Issues    Follow up   Shortness of Breath    Worsens with exercise, denies cough (mucus in throat that wont go down and wont go up that was going on since before last visit)    Referring provider: Georgina Speaks, FNP  HPI:   78 y.o. woman whom we are seeing for evaluation of dyspnea on exertion.  Most recent PCP note reviewed.  Multiple notes from pulmonary rehab reviewed.  Overall doing well.  He is enrolled in pulmonary rehab since last visit.  This is improved her breathing.  She is feeling less short of breath.  We discussed likely due to deconditioning which is reassuring in terms of no other contributor to her symptoms.  Currently she is using her new nebulizer medicine.  This is budesonide .  The plan had been to take DuoNebs and budesonide  twice a day.  At last visit, she was only doing DuoNebs.  Currently on doing budesonide .  We discussed doing both twice a day.  HPI initial visit: Difficulty a bit difficult to obtain from patient.  Unclear if just due to being hard of hearing or other cognitive issue.  History gathered from patient as well as brother, sister-in-law in the room.  State dyspnea on exertion over the last 5 to 6 months.  No clear inciting event.  Denies any respiratory illness etc.  No time of day when things are better or worse.  No position that make things better or worse.  No seasonal or environmental factors she can do the 5 to make things better or worse.  She was given a prescription for albuterol .  Says it helps sometimes.  Not all the time.  Symptoms seem pretty stable day-to-day.  Largely unchanged over time.  Most recent chest CT, in the midst of the symptoms, 07/2022 reviewed and interpreted as clear lungs bilaterally, stable less than 1 cm groundglass opacity/nodule in the right upper lobe.  She was  seen by pulmonary in 2018.  PFTs difficult to perform but no fixed obstruction.  Felt he related to deconditioning, habitus at the time.  Chest imaging clear at that time.  Remains clear.  Discussed at length with clear chest imaging unlikely to have significant pulmonary abnormality.  Asthma is possible does not show up well on imaging nor pulmonary function test.  Discussed role and rationale for empiric treatment as well as additional investigation which they expressed understanding and agreed to.   Questionaires / Pulmonary Flowsheets:   ACT:      No data to display          MMRC:     No data to display          Epworth:      No data to display          Tests:   FENO:  No results found for: NITRICOXIDE  PFT:    Latest Ref Rng & Units 01/28/2023    3:58 PM 04/15/2017    2:00 PM  PFT Results  FVC-Pre L 2.36  1.87   FVC-Predicted Pre % 68  63   FVC-Post L 2.41    FVC-Predicted Post % 69    Pre FEV1/FVC % % 78  81   Post FEV1/FCV % % 80    FEV1-Pre L 1.83  1.52  FEV1-Predicted Pre % 70  65   FEV1-Post L 1.93    DLCO uncorrected ml/min/mmHg 30.91    DLCO UNC% % 137    DLCO corrected ml/min/mmHg 30.91    DLCO COR %Predicted % 137    DLVA Predicted % 155    TLC L 4.64    TLC % Predicted % 80    RV % Predicted % 127    Personally reviewed, not fully interpretable, spirometry suggestive of moderate restriction versus air trapping, unable to do lung volumes once again, DLCO within normal limits, results improved compared to 2018  WALK:     07/25/2024   12:34 PM 06/09/2017    3:43 PM 04/15/2017    4:47 PM  SIX MIN WALK  Supplimental Oxygen during Test? (L/min)  No   2 Minute Oxygen Saturation % 96 %    2 Minute HR 106    4 Minute Oxygen Saturation % 99 %    4 Minute HR 107    6 Minute Oxygen Saturation % 99 %    6 Minute HR 76    Tech Comments:  slow to normal pace/leg pain and SOB//lmr normal pace/SOB//lmr    Imaging: Personally reviewed and as per  EMR discussion this note No results found.  Lab Results: Personally reviewed CBC    Component Value Date/Time   WBC 4.9 08/14/2024 1214   WBC 7.4 04/08/2023 0445   RBC 4.46 08/14/2024 1214   RBC 3.94 04/08/2023 0445   HGB 13.8 08/14/2024 1214   HCT 42.9 08/14/2024 1214   PLT 233 08/14/2024 1214   MCV 96 08/14/2024 1214   MCH 30.9 08/14/2024 1214   MCH 31.0 04/08/2023 0445   MCHC 32.2 08/14/2024 1214   MCHC 32.3 04/08/2023 0445   RDW 12.1 08/14/2024 1214   LYMPHSABS 1.6 11/08/2023 1214   MONOABS 0.6 04/28/2022 0437   EOSABS 0.0 11/08/2023 1214   BASOSABS 0.0 11/08/2023 1214    BMET    Component Value Date/Time   NA 141 08/14/2024 1214   K 4.4 08/14/2024 1214   CL 102 08/14/2024 1214   CO2 26 08/14/2024 1214   GLUCOSE 79 08/14/2024 1214   GLUCOSE 85 04/08/2023 0445   BUN 11 08/14/2024 1214   CREATININE 0.96 08/14/2024 1214   CREATININE 1.16 (H) 03/21/2018 1515   CALCIUM  9.9 08/14/2024 1214   GFRNONAA >60 04/08/2023 0445   GFRNONAA 48 (L) 03/21/2018 1515   GFRAA >60 11/20/2019 0314   GFRAA 55 (L) 03/21/2018 1515    BNP    Component Value Date/Time   BNP 14.6 03/02/2022 1539   BNP 41.6 11/15/2019 1716    ProBNP    Component Value Date/Time   PROBNP 23.0 04/15/2017 1703    Specialty Problems       Pulmonary Problems   Allergic rhinitis   Qualifier: Diagnosis of  By: Adella MD, Almarie SCULL SNOMED Dx Update Oct 2024      Dyspnea on exertion   - Spirometry 04/15/2017  FEV1 1.78 (76%)  Ratio 78 s curvature p no rx   04/15/2017   Walked RA  2 laps @ 185 ft each stopped due to  Sob/ no desats/ nl pace 06/09/2017   Walked RA  2 laps @ 185 ft each stopped due to  Weak legs > sob/ slow pace, no desat         Solitary pulmonary nodule on lung CT   See ct 04/08/17 x 6 mm RUL/ 3 mm LUL  No Known Allergies  Immunization History  Administered Date(s) Administered   Fluad Quad(high Dose 65+) 06/28/2022   Fluad Trivalent(High Dose 65+)  07/04/2023, 06/21/2024   INFLUENZA, HIGH DOSE SEASONAL PF 07/24/2018, 07/02/2019   Influenza,inj,Quad PF,6+ Mos 12/11/2013, 09/12/2015, 07/20/2016, 08/10/2017   Influenza-Unspecified 08/04/2021   PFIZER(Purple Top)SARS-COV-2 Vaccination 12/24/2019, 01/15/2020, 07/14/2020, 10/09/2020   Pneumococcal Polysaccharide-23 12/11/2013, 11/08/2019   Zoster Recombinant(Shingrix ) 03/02/2022, 08/10/2022    Past Medical History:  Diagnosis Date   Allergic rhinitis    Anemia    Anxiety    Arthritis    Bipolar 1 disorder (HCC)    Cancer (HCC)    Depression    Diverticulosis of colon (without mention of hemorrhage) 08/25/2011   Dr. Burnette   Dyspnea    Endometrial cancer (HCC) 04/06/2023   Generalized weakness 07/04/2023   Hearing loss in left ear    since birth   Hyperlipidemia    Hypertension    resolved   Insomnia    Obesity    Palpitation    Renal disorder    Tardive dyskinesia    Tremor    Vertigo     Tobacco History: Social History   Tobacco Use  Smoking Status Never  Smokeless Tobacco Never   Counseling given: Not Answered   Continue to not smoke  Outpatient Encounter Medications as of 09/25/2024  Medication Sig   acetaminophen  (TYLENOL ) 325 MG tablet Take 2 tablets (650 mg total) by mouth every 6 (six) hours as needed for mild pain (or Fever >/= 101).   albuterol  (VENTOLIN  HFA) 108 (90 Base) MCG/ACT inhaler Inhale 2 puffs into the lungs every 6 (six) hours as needed for wheezing or shortness of breath.   aspirin  EC 81 MG tablet Take 1 tablet (81 mg total) by mouth daily. Swallow whole.   atorvastatin  (LIPITOR) 10 MG tablet TAKE 1 TABLET BY MOUTH EVERY DAY   b complex vitamins capsule Take 1 capsule by mouth daily. With C   budesonide  (PULMICORT ) 0.5 MG/2ML nebulizer solution Take 2 mLs (0.5 mg total) by nebulization 2 (two) times daily. DX:J45.40   buPROPion  (WELLBUTRIN  XL) 150 MG 24 hr tablet TAKE 1 TABLET BY MOUTH EVERY DAY   cholecalciferol (VITAMIN D3) 25 MCG (1000  UNIT) tablet Take 1,000 Units by mouth daily.   INGREZZA  80 MG capsule Take 1 capsule (80 mg total) by mouth daily.   ipratropium-albuterol  (DUONEB) 0.5-2.5 (3) MG/3ML SOLN Take 3 mLs by nebulization 3 (three) times daily as needed. 1 treatment in the morning, 1 treatment in the evenings, every 6 hours as needed in between for shortness of breath or wheezing DX: J45.40   meclizine  (ANTIVERT ) 12.5 MG tablet Take 1 tablet (12.5 mg total) by mouth 3 (three) times daily as needed for dizziness.   metoprolol  succinate (TOPROL -XL) 25 MG 24 hr tablet Take 1 tablet (25 mg total) by mouth daily. Take with or immediately following a meal.   mirtazapine  (REMERON ) 7.5 MG tablet Take 0.5 tablets (3.75 mg total) by mouth at bedtime.   Omega-3 Fatty Acids (FISH OIL BURP-LESS PO) Take 700 mg by mouth daily.   omeprazole  (PRILOSEC) 20 MG capsule Take 1 capsule (20 mg total) by mouth 2 (two) times daily before a meal.   polyethylene glycol (MIRALAX  / GLYCOLAX ) 17 g packet Take 17 g by mouth daily.   propranolol  (INDERAL ) 10 MG tablet TAKE 1 TABLET BY MOUTH THREE TIMES A DAY AS NEEDED   No facility-administered encounter medications on file as of 09/25/2024.  Review of Systems  Review of Systems  N/a Physical Exam  BP 132/74   Pulse (!) 59   Temp (!) 96.5 F (35.8 C)   Ht 5' 9 (1.753 m)   Wt 137 lb 6.4 oz (62.3 kg)   SpO2 98%   BMI 20.29 kg/m   Wt Readings from Last 5 Encounters:  09/25/24 137 lb 6.4 oz (62.3 kg)  09/13/24 136 lb 14.5 oz (62.1 kg)  08/30/24 133 lb 2.5 oz (60.4 kg)  08/21/24 131 lb 13.4 oz (59.8 kg)  08/14/24 131 lb 6.4 oz (59.6 kg)    BMI Readings from Last 5 Encounters:  09/25/24 20.29 kg/m  09/13/24 20.22 kg/m  08/30/24 19.66 kg/m  08/21/24 19.47 kg/m  08/14/24 19.40 kg/m     Physical Exam General: Sitting up, no distress Eyes: No icterus Neck: No JVP Pulmonary: Clear, good air excursion, normal work of breathing Abdomen: Nondistended    Assessment &  Plan:   Dyspnea on exertion: Present for a few months but review of records indicates seen in 2018 in pulmonary clinic for the same.  PFTs hard to complete.  But felt at previous evaluation symptoms largely related to habitus, deconditioning.  Her chest imaging historically and recently are clear, unlikely be a pulmonary issue.  PFT suggestive of air trapping versus restriction, DLCO within normal limits so intraparenchymal restriction on unlikely and not seen on prior chest imaging.  Unable to perform lung volumes to differentiate the 2.  Most recent CT scan shows no sign of intrathoracic or parenchymal restriction.  Likely air trapping.  Symptoms have improved with pulmonary rehab which further increasing suspicion for deconditioning as primary driver.  She is encouraged to use DuoNebs and budesonide  twice daily.  She has yet to take both together.  Stressed importance of using both at the same time to see if it helps with her dyspnea.  Throat irritation/swelling/mucus sensation: Not tentatively improved with budesonide  alone.  Stressed importance of using DuoNeb to see if that feels further improvement.  I have referred her to ENT in the past but she did not follow through with this.   Return in about 3 months (around 12/24/2024) for f/u Dr. Annella.   Judy JONELLE Annella, MD 09/25/2024

## 2024-09-25 NOTE — Patient Instructions (Signed)
 It is nice to see you again  It sounds like the pulmonary rehabilitation is helpful, I am so glad to hear this  Please take 2 different medicines via your nebulizer, breathing treatments, both of them twice a day  Use DuoNebs in the morning and in the evening  Use budesonide  in the morning and in the evenings  Rinse your mouth out after using budesonide   You can use your albuterol  inhaler as needed in between, you can use an additional DuoNeb treatment around lunchtime if you are feeling particularly short of breath  Return to clinic in 3 months or sooner as needed with Dr. Annella

## 2024-09-27 ENCOUNTER — Encounter (HOSPITAL_COMMUNITY): Admission: RE | Admit: 2024-09-27 | Discharge: 2024-09-27 | Attending: Pulmonary Disease | Admitting: Pulmonary Disease

## 2024-09-27 DIAGNOSIS — J45909 Unspecified asthma, uncomplicated: Secondary | ICD-10-CM

## 2024-09-27 NOTE — Progress Notes (Signed)
 Daily Session Note  Patient Details  Name: Judy [name] MRN: [id number] Date of Birth: 08-07-1946 Referring Provider:   Conrad Ports Pulmonary Rehab Walk Test from 07/25/2024 in [facility]  Referring Provider Dr. Annella    Encounter Date: 09/27/2024  Check In:  Session Check In - 09/27/24 1326       Check-In   Supervising physician immediately available to respond to emergencies [facility] MD immediately available    Physician(s) Barnie Hila, NP    Location [facility]    Staff Present Ronal Levin, RN, Maud Moats, MS, ACSM-CEP, Exercise Physiologist;Casey Claudene Idelia Aris BS, ACSM-CEP, Exercise Physiologist    Virtual Visit No    Medication changes reported     No    Fall or balance concerns reported    Yes    Comments unstable gait, uses cane    Tobacco Cessation No Change    Warm-up and Cool-down Performed as group-led instruction     Resistance Training Performed Yes    VAD Patient? No    PAD/SET Patient? No      Pain Assessment   Currently in Pain? No/denies    Pain Score 0-No pain    Multiple Pain Sites No          Capillary Blood Glucose: No results found for this or any previous visit (from the past 24 hours).    Tobacco Use History[1]  Goals Met:  Exercise tolerated well Queuing for purse lip breathing No report of concerns or symptoms today Strength training completed today  Goals Unmet:  Not Applicable  Comments: Service time is from 1306 to 1454    Dr. Slater Staff is Medical Director for Pulmonary Rehab at [facility].     [1]  Social History Tobacco Use  Smoking Status Never  Smokeless Tobacco Never

## 2024-10-02 ENCOUNTER — Encounter (HOSPITAL_COMMUNITY)
Admission: RE | Admit: 2024-10-02 | Discharge: 2024-10-02 | Disposition: A | Discharge status: Home / Self Care | Source: Ambulatory Visit | Attending: Pulmonary Disease | Admitting: Pulmonary Disease

## 2024-10-02 VITALS — Wt 138.4 lb

## 2024-10-02 DIAGNOSIS — J45909 Unspecified asthma, uncomplicated: Secondary | ICD-10-CM | POA: Diagnosis not present

## 2024-10-02 NOTE — Progress Notes (Signed)
 Daily Session Note  Patient Details  Name: Judy [name] MRN: [id number] Date of Birth: 25-Sep-1946 Referring Provider:   Conrad Ports Pulmonary Rehab Walk Test from 07/25/2024 in [facility]  Referring Provider Dr. Annella    Encounter Date: 10/02/2024  Check In:  Session Check In - 10/02/24 1510       Check-In   Supervising physician immediately available to respond to emergencies [facility] MD immediately available    Physician(s) Barnie Hila, NP    Location [facility]    Staff Present Ronal Levin, RN, BSN;Casey Smith, RT;Randi Reeve BS, ACSM-CEP, Exercise Physiologist    Virtual Visit No    Medication changes reported     No    Fall or balance concerns reported    Yes    Comments unstable gait, uses cane    Tobacco Cessation No Change    Warm-up and Cool-down Performed as group-led instruction     Resistance Training Performed Yes    VAD Patient? No    PAD/SET Patient? No      Pain Assessment   Currently in Pain? No/denies    Pain Score 0-No pain    Multiple Pain Sites No          Capillary Blood Glucose: No results found for this or any previous visit (from the past 24 hours).   Exercise Prescription Changes - 10/02/24 1500       Response to Exercise   Blood Pressure (Admit) 156/86    Blood Pressure (Exercise) 148/78    Blood Pressure (Exit) 122/68    Heart Rate (Admit) 74 bpm    Heart Rate (Exercise) 82 bpm    Heart Rate (Exit) 63 bpm    Oxygen Saturation (Admit) 97 %    Oxygen Saturation (Exercise) 97 %    Oxygen Saturation (Exit) 98 %    Rating of Perceived Exertion (Exercise) 13    Perceived Dyspnea (Exercise) 3    Duration Continue with 30 min of aerobic exercise without signs/symptoms of physical distress.    Intensity THRR unchanged      Progression   Progression Continue to progress workloads to maintain intensity without signs/symptoms of physical distress.       Resistance Training   Training Prescription Yes    Weight yellow bands    Reps 10-15    Time 10 Minutes      NuStep   Level 2    SPM 25    Minutes 15    METs 1.2      Track   Laps 6   169ft track   Minutes 15    METs 1.42          Tobacco Use History[1]  Goals Met:  Exercise tolerated well Queuing for purse lip breathing No report of concerns or symptoms today Strength training completed today  Goals Unmet:  Not Applicable  Comments: Service time is from 1312 to 1445    Dr. Slater Staff is Medical Director for Pulmonary Rehab at [facility].     [1]  Social History Tobacco Use  Smoking Status Never  Smokeless Tobacco Never

## 2024-10-03 NOTE — Progress Notes (Signed)
 Pulmonary Individual Treatment Plan  Patient Details  Name: Judy [name] MRN: [id number] Date of Birth: 1946/04/08 Referring Provider:   Conrad Ports Pulmonary Rehab Walk Test from 07/25/2024 in [facility]  Referring Provider Dr. Annella    Initial Encounter Date:  Flowsheet Row Pulmonary Rehab Walk Test from 07/25/2024 in [facility]  Date 07/25/24    Visit Diagnosis: Asthma, unspecified asthma severity, unspecified whether complicated, unspecified whether persistent  Patient's Home Medications on Admission:  Current Medications[1]  Past Medical History: Past Medical History:  Diagnosis Date   Allergic rhinitis    Anemia    Anxiety    Arthritis    Bipolar 1 disorder (HCC)    Cancer (HCC)    Depression    Diverticulosis of colon (without mention of hemorrhage) 08/25/2011   Dr. Burnette   Dyspnea    Endometrial cancer (HCC) 04/06/2023   Generalized weakness 07/04/2023   Hearing loss in left ear    since birth   Hyperlipidemia    Hypertension    resolved   Insomnia    Obesity    Palpitation    Renal disorder    Tardive dyskinesia    Tremor    Vertigo     Tobacco Use: Tobacco Use History[2]  Labs: Review Flowsheet  More data exists      Latest Ref Rng & Units 03/02/2022 11/04/2022 11/08/2023 05/08/2024 08/14/2024  Labs for ITP Cardiac and Pulmonary Rehab  Cholestrol 100 - 199 mg/dL 843  848  828  786  804   LDL (calc) 0 - 99 mg/dL 71  63  83  873  899   HDL-C >39 mg/dL 70  72  72  74  82   Trlycerides 0 - 149 mg/dL 77  86  86  76  74   Hemoglobin A1c 4.8 - 5.6 % 5.5  5.6  5.5  - 5.3     Capillary Blood Glucose: Lab Results  Component Value Date   GLUCAP 108 (H) 04/20/2022   GLUCAP 79 11/08/2019   GLUCAP 96 12/10/2013   GLUCAP 98 08/25/2011     Pulmonary Assessment Scores:  Pulmonary Assessment Scores     Row Name 07/25/24 1303          ADL UCSD   ADL Phase Entry     SOB Score total 69       CAT Score   CAT Score 26       mMRC Score   mMRC Score 4       UCSD: Self-administered rating of dyspnea associated with activities of daily living (ADLs) 6-point scale (0 = not at all to 5 = maximal or unable to do because of breathlessness)  Scoring Scores range from 0 to 120.  Minimally important difference is 5 units  CAT: CAT can identify the health impairment of COPD patients and is better correlated with disease progression.  CAT has a scoring range of zero to 40. The CAT score is classified into four groups of low (less than 10), medium (10 - 20), high (21-30) and very high (31-40) based on the impact level of disease on health status. A CAT score over 10 suggests significant symptoms.  A worsening CAT score could be explained by an exacerbation, poor medication adherence, poor inhaler technique, or progression of COPD or comorbid conditions.  CAT MCID is 2 points  mMRC: mMRC (Modified  Medical Research Council) Dyspnea Scale is used to assess the degree of baseline functional disability in patients of respiratory disease due to dyspnea. No minimal important difference is established. A decrease in score of 1 point or greater is considered a positive change.   Pulmonary Function Assessment:  Pulmonary Function Assessment - 07/25/24 1302       Breath   Bilateral Breath Sounds Clear    Shortness of Breath Yes;Limiting activity          Exercise Target Goals: Exercise Program Goal: Individual exercise prescription set using results from initial 6 min walk test and THRR while considering  patients activity barriers and safety.   Exercise Prescription Goal: Initial exercise prescription builds to 30-45 minutes a day of aerobic activity, 2-3 days per week.  Home exercise guidelines will be given to patient during program as part of exercise prescription that the participant will  acknowledge.  Activity Barriers & Risk Stratification:  Activity Barriers & Cardiac Risk Stratification - 07/25/24 1301       Activity Barriers & Cardiac Risk Stratification   Activity Barriers Deconditioning;Muscular Weakness;Shortness of Breath;Back Problems;History of Falls;Balance Concerns          6 Minute Walk:  6 Minute Walk     Row Name 07/25/24 1234         6 Minute Walk   Phase Initial     Distance 600 feet     Walk Time 6 minutes     # of Rest Breaks 1     MPH 1.14     METS 1.96     RPE 13     Perceived Dyspnea  1     VO2 Peak 6.86     Symptoms No     Resting HR 73 bpm     Resting BP 130/70     Resting Oxygen Saturation  98 %     Exercise Oxygen Saturation  during 6 min walk 95 %     Max Ex. HR 107 bpm     Max Ex. BP 132/80     2 Minute Post BP 122/80       Interval HR   1 Minute HR 102     2 Minute HR 106     3 Minute HR 107     4 Minute HR 107     5 Minute HR 99     6 Minute HR 76     2 Minute Post HR 68     Interval Heart Rate? Yes       Interval Oxygen   Interval Oxygen? Yes     Baseline Oxygen Saturation % 98 %     1 Minute Oxygen Saturation % 95 %     1 Minute Liters of Oxygen 0 L     2 Minute Oxygen Saturation % 96 %     2 Minute Liters of Oxygen 0 L     3 Minute Oxygen Saturation % 98 %     3 Minute Liters of Oxygen 0 L     4 Minute Oxygen Saturation % 99 %     4 Minute Liters of Oxygen 0 L     5 Minute Oxygen Saturation % 99 %     5 Minute Liters of Oxygen 0 L     6 Minute Oxygen Saturation % 99 %     6 Minute Liters of Oxygen 0 L     2 Minute Post Oxygen Saturation % 98 %  2 Minute Post Liters of Oxygen 0 L        Oxygen Initial Assessment:  Oxygen Initial Assessment - 07/25/24 1302       Home Oxygen   Home Oxygen Device None    Sleep Oxygen Prescription None    Home Exercise Oxygen Prescription None    Home Resting Oxygen Prescription None      Initial 6 min Walk   Oxygen Used None      Program Oxygen  Prescription   Program Oxygen Prescription None      Intervention   Short Term Goals To learn and understand importance of maintaining oxygen saturations>88%;To learn and demonstrate proper use of respiratory medications;To learn and demonstrate proper pursed lip breathing techniques or other breathing techniques. ;To learn and understand importance of monitoring SPO2 with pulse oximeter and demonstrate accurate use of the pulse oximeter.;To learn and exhibit compliance with exercise, home and travel O2 prescription    Long  Term Goals Exhibits compliance with exercise, home  and travel O2 prescription;Maintenance of O2 saturations>88%;Compliance with respiratory medication;Demonstrates proper use of MDIs;Exhibits proper breathing techniques, such as pursed lip breathing or other method taught during program session;Verbalizes importance of monitoring SPO2 with pulse oximeter and return demonstration          Oxygen Re-Evaluation:  Oxygen Re-Evaluation     Row Name 08/01/24 1451 08/30/24 0908 09/28/24 1344         Program Oxygen Prescription   Program Oxygen Prescription None None None       Home Oxygen   Home Oxygen Device None None None     Sleep Oxygen Prescription None None None     Home Exercise Oxygen Prescription None None None     Home Resting Oxygen Prescription None None None       Goals/Expected Outcomes   Short Term Goals To learn and understand importance of maintaining oxygen saturations>88%;To learn and demonstrate proper use of respiratory medications;To learn and demonstrate proper pursed lip breathing techniques or other breathing techniques. ;To learn and understand importance of monitoring SPO2 with pulse oximeter and demonstrate accurate use of the pulse oximeter.;To learn and exhibit compliance with exercise, home and travel O2 prescription To learn and understand importance of maintaining oxygen saturations>88%;To learn and demonstrate proper use of respiratory  medications;To learn and demonstrate proper pursed lip breathing techniques or other breathing techniques. ;To learn and understand importance of monitoring SPO2 with pulse oximeter and demonstrate accurate use of the pulse oximeter.;To learn and exhibit compliance with exercise, home and travel O2 prescription To learn and understand importance of maintaining oxygen saturations>88%;To learn and demonstrate proper use of respiratory medications;To learn and demonstrate proper pursed lip breathing techniques or other breathing techniques. ;To learn and understand importance of monitoring SPO2 with pulse oximeter and demonstrate accurate use of the pulse oximeter.;To learn and exhibit compliance with exercise, home and travel O2 prescription     Long  Term Goals Exhibits compliance with exercise, home  and travel O2 prescription;Maintenance of O2 saturations>88%;Compliance with respiratory medication;Demonstrates proper use of MDIs;Exhibits proper breathing techniques, such as pursed lip breathing or other method taught during program session;Verbalizes importance of monitoring SPO2 with pulse oximeter and return demonstration Exhibits compliance with exercise, home  and travel O2 prescription;Maintenance of O2 saturations>88%;Compliance with respiratory medication;Demonstrates proper use of MDIs;Exhibits proper breathing techniques, such as pursed lip breathing or other method taught during program session;Verbalizes importance of monitoring SPO2 with pulse oximeter and return demonstration Exhibits compliance with exercise, home  and travel O2 prescription;Maintenance of O2 saturations>88%;Compliance with respiratory medication;Demonstrates proper use of MDIs;Exhibits proper breathing techniques, such as pursed lip breathing or other method taught during program session;Verbalizes importance of monitoring SPO2 with pulse oximeter and return demonstration     Goals/Expected Outcomes Compliance and understanding  of oxygen saturation monitoring and breathing techniques to decrease shortness of breath. Compliance and understanding of oxygen saturation monitoring and breathing techniques to decrease shortness of breath. Compliance and understanding of oxygen saturation monitoring and breathing techniques to decrease shortness of breath.        Oxygen Discharge (Final Oxygen Re-Evaluation):  Oxygen Re-Evaluation - 09/28/24 1344       Program Oxygen Prescription   Program Oxygen Prescription None      Home Oxygen   Home Oxygen Device None    Sleep Oxygen Prescription None    Home Exercise Oxygen Prescription None    Home Resting Oxygen Prescription None      Goals/Expected Outcomes   Short Term Goals To learn and understand importance of maintaining oxygen saturations>88%;To learn and demonstrate proper use of respiratory medications;To learn and demonstrate proper pursed lip breathing techniques or other breathing techniques. ;To learn and understand importance of monitoring SPO2 with pulse oximeter and demonstrate accurate use of the pulse oximeter.;To learn and exhibit compliance with exercise, home and travel O2 prescription    Long  Term Goals Exhibits compliance with exercise, home  and travel O2 prescription;Maintenance of O2 saturations>88%;Compliance with respiratory medication;Demonstrates proper use of MDIs;Exhibits proper breathing techniques, such as pursed lip breathing or other method taught during program session;Verbalizes importance of monitoring SPO2 with pulse oximeter and return demonstration    Goals/Expected Outcomes Compliance and understanding of oxygen saturation monitoring and breathing techniques to decrease shortness of breath.          Initial Exercise Prescription:  Initial Exercise Prescription - 07/25/24 1200       Date of Initial Exercise RX and Referring Provider   Date 07/25/24    Referring Provider Dr. Annella    Expected Discharge Date 10/30/24       NuStep   Level 1    SPM 50    Minutes 30    METs 1.5      Prescription Details   Frequency (times per week) 2    Duration Progress to 30 minutes of continuous aerobic without signs/symptoms of physical distress      Intensity   THRR 40-80% of Max Heartrate 57-114    Ratings of Perceived Exertion 11-13    Perceived Dyspnea 0-4      Progression   Progression Continue to progress workloads to maintain intensity without signs/symptoms of physical distress.      Resistance Training   Training Prescription Yes    Weight yellow bands    Reps 10-15          Perform Capillary Blood Glucose checks as needed.  Exercise Prescription Changes:   Exercise Prescription Changes     Row Name 08/21/24 1500 08/30/24 1521 09/13/24 1152 10/02/24 1500       Response to Exercise   Blood Pressure (Admit) 140/90 128/82 132/80 156/86    Blood Pressure (Exercise) 140/86 -- -- 148/78    Blood Pressure (Exit) 120/70 124/80 120/72 122/68    Heart Rate (Admit) 75 bpm 69 bpm 74 bpm 74 bpm    Heart Rate (Exercise) 85 bpm 72 bpm 78 bpm 82 bpm    Heart Rate (Exit) 73 bpm 60 bpm 63 bpm 63  bpm    Oxygen Saturation (Admit) 98 % 98 % 98 % 97 %    Oxygen Saturation (Exercise) 99 % 97 % 99 % 97 %    Oxygen Saturation (Exit) 96 % 97 % 98 % 98 %    Rating of Perceived Exertion (Exercise) 13 11 14 13     Perceived Dyspnea (Exercise) 2 2 2 3     Duration Continue with 30 min of aerobic exercise without signs/symptoms of physical distress. Continue with 30 min of aerobic exercise without signs/symptoms of physical distress. Continue with 30 min of aerobic exercise without signs/symptoms of physical distress. Continue with 30 min of aerobic exercise without signs/symptoms of physical distress.    Intensity THRR unchanged THRR unchanged THRR unchanged THRR unchanged      Progression   Progression Continue to progress workloads to maintain intensity without signs/symptoms of physical distress. Continue to progress  workloads to maintain intensity without signs/symptoms of physical distress. Continue to progress workloads to maintain intensity without signs/symptoms of physical distress. Continue to progress workloads to maintain intensity without signs/symptoms of physical distress.      Resistance Training   Training Prescription Yes Yes Yes Yes    Weight yellow bands yellow bands yellow bands yellow bands    Reps 10-15 10-15 10-15 10-15    Time 10 Minutes 10 Minutes 10 Minutes 10 Minutes      NuStep   Level 1 1 1 2     SPM 28 38 35 25    Minutes 30 30 15 15     METs 1.2 1.2 1.2 1.2      Track   Laps -- -- 9 6  135ft track    Minutes -- -- 15 15    METs -- -- 1.63 1.42       Exercise Comments:   Exercise Goals and Review:   Exercise Goals     Row Name 07/25/24 1302             Exercise Goals   Increase Physical Activity Yes       Intervention Provide advice, education, support and counseling about physical activity/exercise needs.;Develop an individualized exercise prescription for aerobic and resistive training based on initial evaluation findings, risk stratification, comorbidities and participant's personal goals.       Expected Outcomes Short Term: Attend rehab on a regular basis to increase amount of physical activity.;Long Term: Exercising regularly at least 3-5 days a week.;Long Term: Add in home exercise to make exercise part of routine and to increase amount of physical activity.       Increase Strength and Stamina Yes       Intervention Provide advice, education, support and counseling about physical activity/exercise needs.;Develop an individualized exercise prescription for aerobic and resistive training based on initial evaluation findings, risk stratification, comorbidities and participant's personal goals.       Expected Outcomes Short Term: Increase workloads from initial exercise prescription for resistance, speed, and METs.;Short Term: Perform resistance training  exercises routinely during rehab and add in resistance training at home;Long Term: Improve cardiorespiratory fitness, muscular endurance and strength as measured by increased METs and functional capacity ( )       Able to understand and use rate of perceived exertion (RPE) scale Yes       Intervention Provide education and explanation on how to use RPE scale       Expected Outcomes Short Term: Able to use RPE daily in rehab to express subjective intensity level;Long Term:  Able to  use RPE to guide intensity level when exercising independently       Able to understand and use Dyspnea scale Yes       Intervention Provide education and explanation on how to use Dyspnea scale       Expected Outcomes Short Term: Able to use Dyspnea scale daily in rehab to express subjective sense of shortness of breath during exertion;Long Term: Able to use Dyspnea scale to guide intensity level when exercising independently       Knowledge and understanding of Target Heart Rate Range (THRR) Yes       Intervention Provide education and explanation of THRR including how the numbers were predicted and where they are located for reference       Expected Outcomes Short Term: Able to state/look up THRR;Short Term: Able to use daily as guideline for intensity in rehab;Long Term: Able to use THRR to govern intensity when exercising independently       Understanding of Exercise Prescription Yes       Intervention Provide education, explanation, and written materials on patient's individual exercise prescription       Expected Outcomes Short Term: Able to explain program exercise prescription;Long Term: Able to explain home exercise prescription to exercise independently          Exercise Goals Re-Evaluation :  Exercise Goals Re-Evaluation     Row Name 08/01/24 1449 08/30/24 0906 09/28/24 1333         Exercise Goal Re-Evaluation   Exercise Goals Review Increase Physical Activity;Able to understand and use Dyspnea  scale;Understanding of Exercise Prescription;Increase Strength and Stamina;Knowledge and understanding of Target Heart Rate Range (THRR);Able to understand and use rate of perceived exertion (RPE) scale Increase Physical Activity;Able to understand and use Dyspnea scale;Understanding of Exercise Prescription;Increase Strength and Stamina;Knowledge and understanding of Target Heart Rate Range (THRR);Able to understand and use rate of perceived exertion (RPE) scale Increase Physical Activity;Able to understand and use Dyspnea scale;Understanding of Exercise Prescription;Increase Strength and Stamina;Knowledge and understanding of Target Heart Rate Range (THRR);Able to understand and use rate of perceived exertion (RPE) scale     Comments Pt to begin exercise with pulmonary rehab when she has completed home health. Will progress as tolerated. Pt has completed 3 exercise sessions. She is exercising on the recumbent stepper for 30 min, level 1, METs 1.2. She has not shown any progression yet and moves quite slowly (due to dementia), SPM 33. Will progress as tolerated. She performs warm up and cool down standing with yellow bands, 3 lbs. She needs demonstrative cues at times. She performs squats with support. She is consistant with attendance. Pt has completed 8 exercise sessions. She is now exercising on the recumbent stepper for 15 min, level 1, METs 1.3 and then walking the track for 15 min, METs 1.56. Walking is a great progression for her. She performs warm up and cool down standing with yellow bands, 3 lbs. She needs demonstrative cues at times. She performs squats with support. She is consistant with attendance.     Expected Outcomes Through exercise at rehab and home, the patient will decrease shortness of breath with daily activities and feel confident in carrying out an exercise regimen at home. Through exercise at rehab and home, the patient will decrease shortness of breath with daily activities and feel  confident in carrying out an exercise regimen at home. Through exercise at rehab and home, the patient will decrease shortness of breath with daily activities and feel confident in carrying  out an exercise regimen at home.        Discharge Exercise Prescription (Final Exercise Prescription Changes):  Exercise Prescription Changes - 10/02/24 1500       Response to Exercise   Blood Pressure (Admit) 156/86    Blood Pressure (Exercise) 148/78    Blood Pressure (Exit) 122/68    Heart Rate (Admit) 74 bpm    Heart Rate (Exercise) 82 bpm    Heart Rate (Exit) 63 bpm    Oxygen Saturation (Admit) 97 %    Oxygen Saturation (Exercise) 97 %    Oxygen Saturation (Exit) 98 %    Rating of Perceived Exertion (Exercise) 13    Perceived Dyspnea (Exercise) 3    Duration Continue with 30 min of aerobic exercise without signs/symptoms of physical distress.    Intensity THRR unchanged      Progression   Progression Continue to progress workloads to maintain intensity without signs/symptoms of physical distress.      Resistance Training   Training Prescription Yes    Weight yellow bands    Reps 10-15    Time 10 Minutes      NuStep   Level 2    SPM 25    Minutes 15    METs 1.2      Track   Laps 6   114ft track   Minutes 15    METs 1.42          Nutrition:  Target Goals: Understanding of nutrition guidelines, daily intake of sodium 1500mg , cholesterol 200mg , calories 30% from fat and 7% or less from saturated fats, daily to have 5 or more servings of fruits and vegetables.  Biometrics:    Nutrition Therapy Plan and Nutrition Goals:   Nutrition Assessments:  MEDIFICTS Score Key: >=70 Need to make dietary changes  40-70 Heart Healthy Diet <= 40 Therapeutic Level Cholesterol Diet   Picture Your Plate Scores: <59 Unhealthy dietary pattern with much room for improvement. 41-50 Dietary pattern unlikely to meet recommendations for good health and room for improvement. 51-60 More  healthful dietary pattern, with some room for improvement.  >60 Healthy dietary pattern, although there may be some specific behaviors that could be improved.    Nutrition Goals Re-Evaluation:   Nutrition Goals Discharge (Final Nutrition Goals Re-Evaluation):   Psychosocial: Target Goals: Acknowledge presence or absence of significant depression and/or stress, maximize coping skills, provide positive support system. Participant is able to verbalize types and ability to use techniques and skills needed for reducing stress and depression.  Initial Review & Psychosocial Screening:  Initial Psych Review & Screening - 07/25/24 1303       Initial Review   Current issues with History of Depression;Current Psychotropic Meds      Family Dynamics   Good Support System? Yes    Comments Pt has mild cognitive impairment. Her brother, Victory, and his wife Merlynn assist pt. She lives alone, no longer drives. Sees therapist regulary for medication management, hx of bipolar, depression      Barriers   Psychosocial barriers to participate in program There are no identifiable barriers or psychosocial needs.      Screening Interventions   Interventions Encouraged to exercise    Expected Outcomes Short Term goal: Utilizing psychosocial counselor, staff and physician to assist with identification of specific Stressors or current issues interfering with healing process. Setting desired goal for each stressor or current issue identified.;Long Term Goal: Stressors or current issues are controlled or eliminated.;Short Term goal: Identification and  review with participant of any Quality of Life or Depression concerns found by scoring the questionnaire.;Long Term goal: The participant improves quality of Life and PHQ9 Scores as seen by post scores and/or verbalization of changes          Quality of Life Scores:  Scores of 19 and below usually indicate a poorer quality of life in these areas.  A difference of   2-3 points is a clinically meaningful difference.  A difference of 2-3 points in the total score of the Quality of Life Index has been associated with significant improvement in overall quality of life, self-image, physical symptoms, and general health in studies assessing change in quality of life.  PHQ-9: Review Flowsheet  More data exists      08/14/2024 12/28/2023 11/08/2023 07/04/2023 12/16/2022  Depression screen PHQ 2/9  Decreased Interest 0 0 2 3 0  Down, Depressed, Hopeless 1 1 2 3  0  PHQ - 2 Score 1 1 4 6  0  Altered sleeping 1 1 0 2 0  Tired, decreased energy 1 1 3 3 2   Change in appetite 0 0 3 3 0  Feeling bad or failure about yourself  0 0 1 3 0  Trouble concentrating 0 0 3 1 0  Moving slowly or fidgety/restless 0 0 0 2 0  Suicidal thoughts 0 0 0 0 0  PHQ-9 Score 3  3  14  20  2    Difficult doing work/chores - Not difficult at all Somewhat difficult - -    Details       Data saved with a previous flowsheet row definition        Interpretation of Total Score  Total Score Depression Severity:  1-4 = Minimal depression, 5-9 = Mild depression, 10-14 = Moderate depression, 15-19 = Moderately severe depression, 20-27 = Severe depression   Psychosocial Evaluation and Intervention:  Psychosocial Evaluation - 07/25/24 1305       Psychosocial Evaluation & Interventions   Interventions Encouraged to exercise with the program and follow exercise prescription    Comments Pt has mild cognitive impairment. Her brother, Victory, and his wife Merlynn assist pt. She lives alone, no longer drives. Sees therapist regulary for medication management, hx of bipolar, depression    Expected Outcomes For Galaxy to participate in PR free of any psy/soc barriers or concerns    Continue Psychosocial Services  Follow up required by staff          Psychosocial Re-Evaluation:  Psychosocial Re-Evaluation     Row Name 08/03/24 1419 09/03/24 1034 09/26/24 1347         Psychosocial Re-Evaluation    Current issues with History of Depression;Current Psychotropic Meds History of Depression;Current Psychotropic Meds History of Depression;Current Psychotropic Meds     Comments Monthly psychosocial re-evaluation as follows: No changes since assessment. Syliva has not started the program yet. She is waiting to finish home PT first. Monthly psychosocial re-evaluation as follows: Khaniyah has started the program and is making new friends. She stated that she likes coming, states exercising is okay. She denies any psy/soc barriers or concerns. She states she has good support from her brother and her sister-in-law. Monthly psychosocial re-evaluation as follows: Millie has completed 7 sessions so far. Though she doesnt like exercising she likes coming to class to get out of the house and socialize. She denies any psy/soc barriers or concerns. She states she has good support from her brother and her sister-in-law.     Expected Outcomes  For Tosha to participate in PR free of any psy/soc barriers or concerns For Taira to participate in PR free of any psy/soc barriers or concerns For Callia to participate in PR free of any psy/soc barriers or concerns     Interventions Encouraged to attend Pulmonary Rehabilitation for the exercise Encouraged to attend Pulmonary Rehabilitation for the exercise Encouraged to attend Pulmonary Rehabilitation for the exercise     Continue Psychosocial Services  Follow up required by staff Follow up required by staff Follow up required by staff        Psychosocial Discharge (Final Psychosocial Re-Evaluation):  Psychosocial Re-Evaluation - 09/26/24 1347       Psychosocial Re-Evaluation   Current issues with History of Depression;Current Psychotropic Meds    Comments Monthly psychosocial re-evaluation as follows: Canna has completed 7 sessions so far. Though she doesnt like exercising she likes coming to class to get out of the house and socialize. She denies any psy/soc  barriers or concerns. She states she has good support from her brother and her sister-in-law.    Expected Outcomes For Shannara to participate in PR free of any psy/soc barriers or concerns    Interventions Encouraged to attend Pulmonary Rehabilitation for the exercise    Continue Psychosocial Services  Follow up required by staff          Education: Education Goals: Education classes will be provided on a weekly basis, covering required topics. Participant will state understanding/return demonstration of topics presented.  Learning Barriers/Preferences:  Learning Barriers/Preferences - 07/25/24 1306       Learning Barriers/Preferences   Learning Barriers Inability to learn new things;Exercise Concerns;Reading    Learning Preferences Individual Instruction;Verbal Instruction;Written Material          Education Topics: Know Your Numbers Group instruction that is supported by a PowerPoint presentation. Instructor discusses importance of knowing and understanding resting, exercise, and post-exercise oxygen saturation, heart rate, and blood pressure. Oxygen saturation, heart rate, blood pressure, rating of perceived exertion, and dyspnea are reviewed along with a normal range for these values.  Flowsheet Row PULMONARY REHAB OTHER RESPIRATORY from 09/27/2024 in [facility] for Heart, Vascular, & Lung Health  Date 09/27/24  Educator EP  Instruction Review Code 1- Verbalizes Understanding    Exercise for the Pulmonary Patient Group instruction that is supported by a PowerPoint presentation. Instructor discusses benefits of exercise, core components of exercise, frequency, duration, and intensity of an exercise routine, importance of utilizing pulse oximetry during exercise, safety while exercising, and options of places to exercise outside of rehab.  Flowsheet Row PULMONARY REHAB OTHER RESPIRATORY from 09/20/2024 in [facility] for Heart,  Vascular, & Lung Health  Date 09/20/24  Educator EP  Instruction Review Code 1- Verbalizes Understanding    MET Level  Group instruction provided by PowerPoint, verbal discussion, and written material to support subject matter. Instructor reviews what METs are and how to increase METs.    Pulmonary Medications Verbally interactive group education provided by instructor with focus on inhaled medications and proper administration. Flowsheet Row PULMONARY REHAB OTHER RESPIRATORY from 09/13/2024 in [facility] for Heart, Vascular, & Lung Health  Date 09/13/24  Educator RT  Instruction Review Code 1- Verbalizes Understanding    Anatomy and Physiology of the Respiratory System Group instruction provided by PowerPoint, verbal discussion, and written material to support subject matter. Instructor reviews respiratory cycle and anatomical components of the respiratory system and their functions. Instructor also reviews  differences in obstructive and restrictive respiratory diseases with examples of each.  Flowsheet Row PULMONARY REHAB OTHER RESPIRATORY from 08/30/2024 in [facility] for Heart, Vascular, & Lung Health  Date 08/30/24  Educator RT  Instruction Review Code 1- Verbalizes Understanding    Oxygen Safety Group instruction provided by PowerPoint, verbal discussion, and written material to support subject matter. There is an overview of What is Oxygen and Why do we need it.  Instructor also reviews how to create a safe environment for oxygen use, the importance of using oxygen as prescribed, and the risks of noncompliance. There is a brief discussion on traveling with oxygen and resources the patient may utilize.   Oxygen Use Group instruction provided by PowerPoint, verbal discussion, and written material to discuss how supplemental oxygen is prescribed and different types of oxygen supply systems. Resources for more information are  provided.    Breathing Techniques Group instruction that is supported by demonstration and informational handouts. Instructor discusses the benefits of pursed lip and diaphragmatic breathing and detailed demonstration on how to perform both.     Risk Factor Reduction Group instruction that is supported by a PowerPoint presentation. Instructor discusses the definition of a risk factor, different risk factors for pulmonary disease, and how the heart and lungs work together.   Pulmonary Diseases Group instruction provided by PowerPoint, verbal discussion, and written material to support subject matter. Instructor gives an overview of the different type of pulmonary diseases. There is also a discussion on risk factors and symptoms as well as ways to manage the diseases. Flowsheet Row PULMONARY REHAB OTHER RESPIRATORY from 08/23/2024 in [facility] for Heart, Vascular, & Lung Health  Date 08/23/24  Educator RT  Instruction Review Code 1- Verbalizes Understanding    Stress and Energy Conservation Group instruction provided by PowerPoint, verbal discussion, and written material to support subject matter. Instructor gives an overview of stress and the impact it can have on the body. Instructor also reviews ways to reduce stress. There is also a discussion on energy conservation and ways to conserve energy throughout the day.   Warning Signs and Symptoms Group instruction provided by PowerPoint, verbal discussion, and written material to support subject matter. Instructor reviews warning signs and symptoms of stroke, heart attack, cold and flu. Instructor also reviews ways to prevent the spread of infection.   Other Education Group or individual verbal, written, or video instructions that support the educational goals of the pulmonary rehab program.    Knowledge Questionnaire Score:  Knowledge Questionnaire Score - 07/25/24 1306       Knowledge Questionnaire Score    Pre Score --   unable to complete d/t cognitive impairment         Core Components/Risk Factors/Patient Goals at Admission:  Personal Goals and Risk Factors at Admission - 07/25/24 1307       Core Components/Risk Factors/Patient Goals on Admission   Improve shortness of breath with ADL's Yes    Intervention Provide education, individualized exercise plan and daily activity instruction to help decrease symptoms of SOB with activities of daily living.    Expected Outcomes Short Term: Improve cardiorespiratory fitness to achieve a reduction of symptoms when performing ADLs;Long Term: Be able to perform more ADLs without symptoms or delay the onset of symptoms    Increase knowledge of respiratory medications and ability to use respiratory devices properly  Yes    Intervention Provide education and demonstration as needed of appropriate use of medications,  inhalers, and oxygen therapy.    Expected Outcomes Short Term: Achieves understanding of medications use. Understands that oxygen is a medication prescribed by physician. Demonstrates appropriate use of inhaler and oxygen therapy.;Long Term: Maintain appropriate use of medications, inhalers, and oxygen therapy.          Core Components/Risk Factors/Patient Goals Review:   Goals and Risk Factor Review     Row Name 08/03/24 1421 09/03/24 1043 09/26/24 1349         Core Components/Risk Factors/Patient Goals Review   Personal Goals Review Improve shortness of breath with ADL's;Develop more efficient breathing techniques such as purse lipped breathing and diaphragmatic breathing and practicing self-pacing with activity.;Increase knowledge of respiratory medications and ability to use respiratory devices properly. Improve shortness of breath with ADL's;Develop more efficient breathing techniques such as purse lipped breathing and diaphragmatic breathing and practicing self-pacing with activity.;Increase knowledge of respiratory medications and  ability to use respiratory devices properly. Improve shortness of breath with ADL's;Develop more efficient breathing techniques such as purse lipped breathing and diaphragmatic breathing and practicing self-pacing with activity.     Review Monthly review of patient's Core Components/Risk Factors/Patient Goals are as follows: Unable to assess. Dachelle has not started the program yet. She is due to start after she finishes HH PT. Monthly review of patient's Core Components/Risk Factors/Patient Goals are as follows:  Goal in progress for improving her shortness of breath with ADLs. Chrishonda is trying to build up her strength and endurance. She has completed 4 sessions so far and is exercising on the NuStep for 30 minutes. Her oxygen saturation has been in a normal range on room air with exertion. Goal progressing for developing more efficient breathing techniques such as purse lipped breathing and diaphragmatic breathing; and practicing self-pacing with activity. Syliva needs to be prompted to perform purse lipped breathing while short of breath. We work on this with her while performing the warmup and while exercising. She is working on diaphragmatic breathing at home. Goal met on increasing her knowledge of respiratory medications and the ability to use her devices correctly. Mita worked with our respiratory therapist on how to properly use her inhalers. Staff have discussed how to use, when to use, side effects and contraindications with her. All questions were answered, and she understood without assistance. Mikesha will continue to benefit from PR for nutrition, education, exercise, and lifestyle modification. Monthly review of patient's Core Components/Risk Factors/Patient Goals are as follows: Goal in progress for improving her shortness of breath with ADLs. Merita is trying to build up her strength and endurance. She has completed 7 sessions so far and is exercising on the NuStep and is now walking the track.  Her oxygen saturation has been in a normal range on room air with exertion. Goal met for developing more efficient breathing techniques such as purse lipped breathing and diaphragmatic breathing; and practicing self-pacing with activity. Syliva can perform purse lipped breathing while short of breath. She can perform PLB with the warmup and while exercising. She is working on diaphragmatic breathing at home. Prue will continue to benefit from PR for nutrition, education, exercise, and lifestyle modification.     Expected Outcomes Pt will show progress toward meeting expected goals and outcomes. Pt will show progress toward meeting expected goals and outcomes. Pt will show progress toward meeting expected goals and outcomes.        Core Components/Risk Factors/Patient Goals at Discharge (Final Review):   Goals and Risk Factor Review - 09/26/24 1349  Core Components/Risk Factors/Patient Goals Review   Personal Goals Review Improve shortness of breath with ADL's;Develop more efficient breathing techniques such as purse lipped breathing and diaphragmatic breathing and practicing self-pacing with activity.    Review Monthly review of patient's Core Components/Risk Factors/Patient Goals are as follows: Goal in progress for improving her shortness of breath with ADLs. Dearra is trying to build up her strength and endurance. She has completed 7 sessions so far and is exercising on the NuStep and is now walking the track. Her oxygen saturation has been in a normal range on room air with exertion. Goal met for developing more efficient breathing techniques such as purse lipped breathing and diaphragmatic breathing; and practicing self-pacing with activity. Syliva can perform purse lipped breathing while short of breath. She can perform PLB with the warmup and while exercising. She is working on diaphragmatic breathing at home. Viann will continue to benefit from PR for nutrition, education, exercise, and  lifestyle modification.    Expected Outcomes Pt will show progress toward meeting expected goals and outcomes.          ITP Comments:   Comments: Pt is making expected progress toward Pulmonary Rehab goals after completing 9 session(s). Recommend continued exercise, life style modification, education, and utilization of breathing techniques to increase stamina and strength, while also decreasing shortness of breath with exertion.  Dr. Slater Staff is Medical Director for Pulmonary Rehab at [facility].       [1]  Current Outpatient Medications:    acetaminophen  (TYLENOL ) 325 MG tablet, Take 2 tablets (650 mg total) by mouth every 6 (six) hours as needed for mild pain (or Fever >/= 101)., Disp: , Rfl:    albuterol  (VENTOLIN  HFA) 108 (90 Base) MCG/ACT inhaler, Inhale 2 puffs into the lungs every 6 (six) hours as needed for wheezing or shortness of breath., Disp: 18 each, Rfl: 2   aspirin  EC 81 MG tablet, Take 1 tablet (81 mg total) by mouth daily. Swallow whole., Disp: 90 tablet, Rfl: 3   atorvastatin  (LIPITOR) 10 MG tablet, TAKE 1 TABLET BY MOUTH EVERY DAY, Disp: 90 tablet, Rfl: 1   b complex vitamins capsule, Take 1 capsule by mouth daily. With C, Disp: , Rfl:    budesonide  (PULMICORT ) 0.5 MG/2ML nebulizer solution, Take 2 mLs (0.5 mg total) by nebulization 2 (two) times daily. DX:J45.40, Disp: 120 mL, Rfl: 12   buPROPion  (WELLBUTRIN  XL) 150 MG 24 hr tablet, TAKE 1 TABLET BY MOUTH EVERY DAY, Disp: 90 tablet, Rfl: 1   cholecalciferol (VITAMIN D3) 25 MCG (1000 UNIT) tablet, Take 1,000 Units by mouth daily., Disp: , Rfl:    INGREZZA  80 MG capsule, Take 1 capsule (80 mg total) by mouth daily., Disp: 30 capsule, Rfl: 5   ipratropium-albuterol  (DUONEB) 0.5-2.5 (3) MG/3ML SOLN, Take 3 mLs by nebulization 3 (three) times daily as needed. 1 treatment in the morning, 1 treatment in the evenings, every 6 hours as needed in between for shortness of breath or wheezing DX: J45.40, Disp: 270 mL,  Rfl: 12   meclizine  (ANTIVERT ) 12.5 MG tablet, Take 1 tablet (12.5 mg total) by mouth 3 (three) times daily as needed for dizziness., Disp: 30 tablet, Rfl: 2   metoprolol  succinate (TOPROL -XL) 25 MG 24 hr tablet, Take 1 tablet (25 mg total) by mouth daily. Take with or immediately following a meal., Disp: 90 tablet, Rfl: 3   mirtazapine  (REMERON ) 7.5 MG tablet, Take 0.5 tablets (3.75 mg total) by mouth at bedtime., Disp: 45  tablet, Rfl: 1   Omega-3 Fatty Acids (FISH OIL BURP-LESS PO), Take 700 mg by mouth daily., Disp: , Rfl:    omeprazole  (PRILOSEC) 20 MG capsule, Take 1 capsule (20 mg total) by mouth 2 (two) times daily before a meal., Disp: 14 capsule, Rfl: 0   polyethylene glycol (MIRALAX  / GLYCOLAX ) 17 g packet, Take 17 g by mouth daily., Disp: 30 each, Rfl: 2   propranolol  (INDERAL ) 10 MG tablet, TAKE 1 TABLET BY MOUTH THREE TIMES A DAY AS NEEDED, Disp: 270 tablet, Rfl: 2 [2]  Social History Tobacco Use  Smoking Status Never  Smokeless Tobacco Never

## 2024-10-09 ENCOUNTER — Encounter (HOSPITAL_COMMUNITY): Admission: RE | Admit: 2024-10-09 | Source: Ambulatory Visit

## 2024-10-10 ENCOUNTER — Telehealth (HOSPITAL_COMMUNITY): Payer: Self-pay | Admitting: *Deleted

## 2024-10-10 NOTE — Telephone Encounter (Signed)
 Notified pt's brother Victory that will not have PR class 1/1. He will notify Skyley. Aliene Aris BS, ACSM-CEP 10/10/2024 11:01 AM

## 2024-10-12 ENCOUNTER — Encounter: Payer: Self-pay | Admitting: Gynecologic Oncology

## 2024-10-12 ENCOUNTER — Inpatient Hospital Stay: Attending: Gynecologic Oncology | Admitting: Gynecologic Oncology

## 2024-10-12 VITALS — BP 131/74 | HR 73 | Temp 99.5°F | Resp 18 | Wt 134.0 lb

## 2024-10-12 DIAGNOSIS — Z8542 Personal history of malignant neoplasm of other parts of uterus: Secondary | ICD-10-CM | POA: Diagnosis not present

## 2024-10-12 DIAGNOSIS — Z90722 Acquired absence of ovaries, bilateral: Secondary | ICD-10-CM | POA: Diagnosis not present

## 2024-10-12 DIAGNOSIS — Z90710 Acquired absence of both cervix and uterus: Secondary | ICD-10-CM | POA: Diagnosis not present

## 2024-10-12 DIAGNOSIS — Z9079 Acquired absence of other genital organ(s): Secondary | ICD-10-CM | POA: Diagnosis not present

## 2024-10-12 DIAGNOSIS — C541 Malignant neoplasm of endometrium: Secondary | ICD-10-CM

## 2024-10-12 NOTE — Patient Instructions (Signed)
 It was good to see you today.  I do not see or feel any evidence of cancer recurrence on your exam.  I will see you for follow-up in 6 months.  As always, if you develop any new and concerning symptoms before your next visit, please call to see me sooner.

## 2024-10-12 NOTE — Progress Notes (Signed)
 Gynecologic Oncology Return Clinic Visit  10/12/2024  Reason for Visit: surveillance   Treatment History: Oncology History Overview Note  01/12/23: Pap - atypical glandular cell, favor neoplastic. While overall cellularity of the pap is low, cells look highly atypical and are suspicious for carcinoma favored to be endometrial. HR HPV negative.    Pelvic ultrasound on 01/21/23 revealed a uterus measuring 7.5 x 5.4 x 7 cm.  Multiple fibroids noted measuring up to 3.8 cm.  Endometrium is thickened measuring 24 mm and irregular.  There is a frond-like contour appreciated adjacent to a small amount of endometrial free fluid and a focal masslike area with blood flow measuring up to 4.2 cm.  Left ovary not visualized.  Right ovary contains a 2.3 cm simple appearing cyst.   CT A/P on 02/07/23 reveals a heterogenous lobulated uterus. No evidence of metastatic disease. Minimal sigmoid diverticulosis without diverticulitis.   The patient had recent visit with pulmonology. 1.4% risk of postop pulmonary complication estimated with duration of surgery is less than 3 hours.   Endometrial cancer (HCC)  04/06/2023 Initial Diagnosis   Endometrial cancer (HCC)   04/06/2023 Surgery   Dilation and curettage, robotic-assisted laparoscopic total hysterectomy with bilateral salpingoophorectomy, SLN biopsy bilaterally, peritoneal biopsy, mini-lap for specimen delivery   Findings: On EUA, 10-12 cm mobile uterus. On D&C moderate amount of tissue c/w tumor appreciated after curetting. On intra-abdominal entry, significant adhesions between bilateral liver surfaces and the anterior abdominal wall. Normal appearing stomach, small and large bowel, omentum. No ascites. No adenopathy. Uterus 10 cm with multiple 2-4 cm fibroids including right lateral LUS fibroid. Normal appearing adnexa. Miliary-appearing disease noted on the peritoneum overlying the left uterosacral ligament (resected). No other peritoneal findings. Mapping  successful to bilateral obturator SLNs.  Frozen section from [facility] c/w endometroid cancer, likely low grade.   04/06/2023 Pathology Results   Stage IA2, grade 1 MI 30% Negative SLNs Focus suspicious for LVI?  MMR: loss of MLH1 and PMS2      Interval History: Doing well.  Has started pulmonary rehab program.  Continues to note some wheezing and shortness of breath intermittently.  Denies any abdominal or pelvic pain.  Denies any vaginal bleeding.  Denies any change to bowel or bladder function.  Past Medical/Surgical History: Past Medical History:  Diagnosis Date   Allergic rhinitis    Anemia    Anxiety    Arthritis    Bipolar 1 disorder (HCC)    Cancer (HCC)    Depression    Diverticulosis of colon (without mention of hemorrhage) 08/25/2011   Dr. Burnette   Dyspnea    Endometrial cancer (HCC) 04/06/2023   Generalized weakness 07/04/2023   Hearing loss in left ear    since birth   Hyperlipidemia    Hypertension    resolved   Insomnia    Obesity    Palpitation    Renal disorder    Tardive dyskinesia    Tremor    Vertigo     Past Surgical History:  Procedure Laterality Date   COLONOSCOPY  08/25/2011   Procedure: COLONOSCOPY;  Surgeon: Elsie CHRISTELLA Burnette, MD;  Location: [facility];  Service: Endoscopy;  Laterality: N/A;   DILATION AND CURETTAGE OF UTERUS     DILATION AND CURETTAGE OF UTERUS N/A 04/06/2023   Procedure: DILATATION AND CURETTAGE;  Surgeon: Viktoria Comer SAUNDERS, MD;  Location: [facility];  Service: Gynecology;  Laterality: N/A;   LYMPH NODE DISSECTION N/A 04/06/2023   Procedure: MINI LAPAROTOMY;  Surgeon:  Viktoria Comer SAUNDERS, MD;  Location: [facility];  Service: Gynecology;  Laterality: N/A;   ROBOTIC ASSISTED TOTAL HYSTERECTOMY WITH BILATERAL SALPINGO OOPHERECTOMY N/A 04/06/2023   Procedure: XI ROBOTIC ASSISTED TOTAL HYSTERECTOMY WITH BILATERAL SALPINGO OOPHORECTOMY;  Surgeon: Viktoria Comer SAUNDERS, MD;  Location: [facility];  Service: Gynecology;  Laterality: N/A;    SENTINEL NODE BIOPSY N/A 04/06/2023   Procedure: SENTINEL NODE BIOPSY;  Surgeon: Viktoria Comer SAUNDERS, MD;  Location: [facility];  Service: Gynecology;  Laterality: N/A;   TOTAL ABDOMINAL HYSTERECTOMY      Family History  Problem Relation Age of Onset   Hypertension Mother    Kidney disease Mother    Stomach cancer Father    Hyperlipidemia Sister    Hypothyroidism Sister    Prostate cancer Brother    Hypertension Brother    Kidney disease Brother    Stroke Brother    Prostate cancer Brother    Breast cancer Neg Hx    Pancreatic cancer Neg Hx    Ovarian cancer Neg Hx    Endometrial cancer Neg Hx     Social History   Socioeconomic History   Marital status: Single    Spouse name: Not on file   Number of children: 1   Years of education: 12   Highest education level: Not on file  Occupational History   Occupation: Retired  Tobacco Use   Smoking status: Never   Smokeless tobacco: Never  Vaping Use   Vaping status: Never Used  Substance and Sexual Activity   Alcohol  use: Never   Drug use: Never   Sexual activity: Not Currently  Other Topics Concern   Not on file  Social History Narrative   Lives alone, brother, Victory and his wife Merlynn assist her. Has a son   Social Drivers of Health   Tobacco Use: Low Risk (10/12/2024)   Patient History    Smoking Tobacco Use: Never    Smokeless Tobacco Use: Never    Passive Exposure: Not on file  Financial Resource Strain: Low Risk (12/28/2023)   Overall Financial Resource Strain (CARDIA)    Difficulty of Paying Living Expenses: Not hard at all  Food Insecurity: No Food Insecurity (12/28/2023)   Hunger Vital Sign    Worried About Running Out of Food in the Last Year: Never true    Ran Out of Food in the Last Year: Never true  Transportation Needs: No Transportation Needs (12/28/2023)   PRAPARE - Administrator, Civil Service (Medical): No    Lack of Transportation (Non-Medical): No  Physical Activity: Insufficiently  Active (12/28/2023)   Exercise Vital Sign    Days of Exercise per Week: 2 days    Minutes of Exercise per Session: 30 min  Stress: Stress Concern Present (12/28/2023)   [employer] of Occupational Health - Occupational Stress Questionnaire    Feeling of Stress : To some extent  Social Connections: Socially Isolated (12/28/2023)   Social Connection and Isolation Panel    Frequency of Communication with Friends and Family: Three times a week    Frequency of Social Gatherings with Friends and Family: More than three times a week    Attends Religious Services: Never    Database Administrator or Organizations: No    Attends Banker Meetings: Never    Marital Status: Divorced  Depression (PHQ2-9): Low Risk (08/14/2024)   Depression (PHQ2-9)    PHQ-2 Score: 3  Alcohol  Screen: Low Risk (12/28/2023)   Alcohol  Screen  Last Alcohol  Screening Score (AUDIT): 0  Housing: Unknown (12/28/2023)   Housing Stability Vital Sign    Unable to Pay for Housing in the Last Year: No    Number of Times Moved in the Last Year: Not on file    Homeless in the Last Year: No  Utilities: Not At Risk (12/28/2023)   [facility] Utilities    Threatened with loss of utilities: No  Health Literacy: Adequate Health Literacy (12/28/2023)   B1300 Health Literacy    Frequency of need for help with medical instructions: Never    Current Medications: Current Medications[1]  Review of Systems: + Ringing in ears, hearing loss, voice changes, vision problems, intermittent wheezing, frequency, anxiety, depression, confusion, hot flashes, problems with walking, swollen glands/lymph nodes. Denies appetite changes, fevers, chills, fatigue, unexplained weight changes. Denies neck lumps or masses, mouth sores. Denies cough.  Denies shortness of breath. Denies chest pain or palpitations. Denies leg swelling. Denies abdominal distention, pain, blood in stools, constipation, diarrhea, nausea, vomiting, or early  satiety. Denies pain with intercourse, dysuria, hematuria or incontinence. Denies pelvic pain, vaginal bleeding or vaginal discharge.   Denies joint pain, back pain or muscle pain/cramps. Denies itching, rash, or wounds. Denies dizziness, headaches, numbness or seizures. Denies swollen lymph nodes or glands. Denies decreased concentration.  Physical Exam: BP 131/74 (BP Location: Left Arm, Patient Position: Sitting)   Pulse 73   Temp 99.5 F (37.5 C) (Oral)   Resp 18   Wt 134 lb (60.8 kg)   SpO2 99%   BMI 19.79 kg/m  General: Alert, oriented, no acute distress. HEENT: Posterior oropharynx clear, sclera anicteric. Chest: Clear to auscultation bilaterally.  No wheezes or rhonchi.  Increased respiratory rate with shallow breaths, appears comfortable. Cardiovascular: Regular rate and rhythm, no murmurs. Abdomen: soft, nontender.  Normoactive bowel sounds.  No masses or hepatosplenomegaly appreciated.  Well-healed incisions. Extremities: Grossly normal range of motion.  Warm, well perfused.  No edema bilaterally. Skin: No rashes or lesions noted. Lymphatics: No cervical, supraclavicular, or inguinal adenopathy. GU: Normal appearing external genitalia without erythema, excoriation, or lesions.  Speculum exam reveals utterly atrophic vaginal mucosa, cuff intact.  Bimanual exam reveals no masses or nodularity.  Rectovaginal exam confirms these findings.  Laboratory & Radiologic Studies: None new  Assessment & Plan: Judy [name] is a 79 y.o. woman with a history of Stage IA2 grade 1 endometrioid endometrial adenocarcinoma who presents for follow-up. ? Single focus of LVI. Surgery 03/2023. MSI-H, MLH1 hypermethylation present. P53 WT.   The patient is overall doing well, remains NED on exam today.   Continues to have issues with breathing.  Now and pulmonary rehab.  Denies any significant improvement in her breathing.   Per NCCN surveillance recommendations, discussed plan for  surveillance visits every 6 months. The patient will see Melissa in 6 months.  We reviewed signs and symptoms that should prompt a phone call between visits and would be concerning for cancer recurrence.  20 minutes of total time was spent for this patient encounter, including preparation, face-to-face counseling with the patient and coordination of care, and documentation of the encounter.  Comer Dollar, MD  Division of Gynecologic Oncology  Department of Obstetrics and Gynecology  [facility]      [1]  Current Outpatient Medications:    acetaminophen  (TYLENOL ) 325 MG tablet, Take 2 tablets (650 mg total) by mouth every 6 (six) hours as needed for mild pain (or Fever >/= 101)., Disp: , Rfl:  albuterol  (VENTOLIN  HFA) 108 (90 Base) MCG/ACT inhaler, Inhale 2 puffs into the lungs every 6 (six) hours as needed for wheezing or shortness of breath., Disp: 18 each, Rfl: 2   aspirin  EC 81 MG tablet, Take 1 tablet (81 mg total) by mouth daily. Swallow whole., Disp: 90 tablet, Rfl: 3   atorvastatin  (LIPITOR) 10 MG tablet, TAKE 1 TABLET BY MOUTH EVERY DAY, Disp: 90 tablet, Rfl: 1   b complex vitamins capsule, Take 1 capsule by mouth daily. With C, Disp: , Rfl:    budesonide  (PULMICORT ) 0.5 MG/2ML nebulizer solution, Take 2 mLs (0.5 mg total) by nebulization 2 (two) times daily. DX:J45.40, Disp: 120 mL, Rfl: 12   buPROPion  (WELLBUTRIN  XL) 150 MG 24 hr tablet, TAKE 1 TABLET BY MOUTH EVERY DAY, Disp: 90 tablet, Rfl: 1   cholecalciferol (VITAMIN D3) 25 MCG (1000 UNIT) tablet, Take 1,000 Units by mouth daily., Disp: , Rfl:    INGREZZA  80 MG capsule, Take 1 capsule (80 mg total) by mouth daily., Disp: 30 capsule, Rfl: 5   ipratropium-albuterol  (DUONEB) 0.5-2.5 (3) MG/3ML SOLN, Take 3 mLs by nebulization 3 (three) times daily as needed. 1 treatment in the morning, 1 treatment in the evenings, every 6 hours as needed in between for shortness of breath or wheezing DX: J45.40, Disp:  270 mL, Rfl: 12   meclizine  (ANTIVERT ) 12.5 MG tablet, Take 1 tablet (12.5 mg total) by mouth 3 (three) times daily as needed for dizziness., Disp: 30 tablet, Rfl: 2   metoprolol  succinate (TOPROL -XL) 25 MG 24 hr tablet, Take 1 tablet (25 mg total) by mouth daily. Take with or immediately following a meal., Disp: 90 tablet, Rfl: 3   mirtazapine  (REMERON ) 7.5 MG tablet, Take 0.5 tablets (3.75 mg total) by mouth at bedtime., Disp: 45 tablet, Rfl: 1   Omega-3 Fatty Acids (FISH OIL BURP-LESS PO), Take 700 mg by mouth daily., Disp: , Rfl:    omeprazole  (PRILOSEC) 20 MG capsule, Take 1 capsule (20 mg total) by mouth 2 (two) times daily before a meal., Disp: 14 capsule, Rfl: 0   polyethylene glycol (MIRALAX  / GLYCOLAX ) 17 g packet, Take 17 g by mouth daily., Disp: 30 each, Rfl: 2   propranolol  (INDERAL ) 10 MG tablet, TAKE 1 TABLET BY MOUTH THREE TIMES A DAY AS NEEDED, Disp: 270 tablet, Rfl: 2

## 2024-10-16 ENCOUNTER — Encounter (HOSPITAL_COMMUNITY)
Admission: RE | Admit: 2024-10-16 | Discharge: 2024-10-16 | Disposition: A | Discharge status: Home / Self Care | Source: Ambulatory Visit | Attending: Pulmonary Disease | Admitting: Pulmonary Disease

## 2024-10-16 VITALS — Wt 133.8 lb

## 2024-10-16 DIAGNOSIS — J45909 Unspecified asthma, uncomplicated: Secondary | ICD-10-CM | POA: Diagnosis present

## 2024-10-16 NOTE — Progress Notes (Signed)
 Daily Session Note  Patient Details  Name: Judy [name] MRN: [id number] Date of Birth: 1946/05/11 Referring Provider:   Conrad Ports Pulmonary Rehab Walk Test from 07/25/2024 in [facility]  Referring Provider Dr. Annella    Encounter Date: 10/16/2024  Check In:  Session Check In - 10/16/24 1321       Check-In   Supervising physician immediately available to respond to emergencies [facility] MD immediately available    Physician(s) Rosabel Mose, NP    Location [facility]    Staff Present Ronal Levin, RN, BSN;Shandon Burlingame Claudene, RT;Randi [place] BS, ACSM-CEP, Exercise Physiologist;Kaylee [place], MS, ACSM-CEP, Exercise Physiologist    Virtual Visit No    Medication changes reported     No    Fall or balance concerns reported    Yes    Comments unstable gait, uses cane    Tobacco Cessation No Change    Warm-up and Cool-down Performed as group-led instruction    Resistance Training Performed Yes    VAD Patient? No    PAD/SET Patient? No      Pain Assessment   Currently in Pain? No/denies    Pain Score 0-No pain    Multiple Pain Sites No          Capillary Blood Glucose: No results found for this or any previous visit (from the past 24 hours).   Exercise Prescription Changes - 10/16/24 1400       Response to Exercise   Blood Pressure (Admit) 126/86    Blood Pressure (Exercise) 132/80    Blood Pressure (Exit) 108/70    Heart Rate (Admit) 79 bpm    Heart Rate (Exercise) 104 bpm    Heart Rate (Exit) 58 bpm    Oxygen Saturation (Admit) 100 %    Oxygen Saturation (Exercise) 98 %    Oxygen Saturation (Exit) 97 %    Rating of Perceived Exertion (Exercise) 12    Perceived Dyspnea (Exercise) 2    Duration Continue with 30 min of aerobic exercise without signs/symptoms of physical distress.    Intensity THRR unchanged      Progression   Progression Continue to progress workloads to maintain intensity without  signs/symptoms of physical distress.      Resistance Training   Weight yellow bands    Reps 10-15    Time 10 Minutes      NuStep   Level 2    SPM 22    Minutes 15    METs 1.1      Track   Laps 6   19ft track   Minutes 15    METs 1.42          Tobacco Use History[1]  Goals Met:  Proper associated with RPD/PD & O2 Sat [place] with exercise equipment Exercise tolerated well No report of concerns or symptoms today Strength training completed today  Goals Unmet:  Not Applicable  Comments: Service time is from 1319 to 1439.    Dr. Slater Staff is Medical Director for Pulmonary Rehab at [facility].     [1]  Social History Tobacco Use  Smoking Status Never  Smokeless Tobacco Never

## 2024-10-18 ENCOUNTER — Encounter (HOSPITAL_COMMUNITY)

## 2024-10-23 ENCOUNTER — Telehealth (HOSPITAL_COMMUNITY): Payer: Self-pay

## 2024-10-23 ENCOUNTER — Encounter (HOSPITAL_COMMUNITY): Admission: RE | Admit: 2024-10-23 | Source: Ambulatory Visit

## 2024-10-23 ENCOUNTER — Telehealth: Payer: Self-pay

## 2024-10-23 NOTE — Telephone Encounter (Signed)
 Patient c/o for 1:15 PR class, states she is on medication for vertigo.

## 2024-10-23 NOTE — Telephone Encounter (Signed)
 Copied from CRM [id number]. Topic: General - Other >> Oct 23, 2024  3:01 PM Joesph NOVAK wrote: Reason for CRM: Nichole Childes would like for patients POA form mailed to her. She would like for Janece to give her a call. PH: [phone].   [address] [place], [address].  LETTER MAILED TO NICHOLE ALBERTINE KILLIAN

## 2024-10-25 ENCOUNTER — Encounter (HOSPITAL_COMMUNITY)
Admission: RE | Admit: 2024-10-25 | Discharge: 2024-10-25 | Disposition: A | Source: Ambulatory Visit | Attending: Pulmonary Disease | Admitting: Pulmonary Disease

## 2024-10-25 ENCOUNTER — Telehealth (HOSPITAL_COMMUNITY): Payer: Self-pay

## 2024-10-25 DIAGNOSIS — J45909 Unspecified asthma, uncomplicated: Secondary | ICD-10-CM

## 2024-10-25 NOTE — Telephone Encounter (Signed)
 Patient c/o for 1:15 PR class, states she is still taking medicine for vertigo.

## 2024-10-30 ENCOUNTER — Telehealth (HOSPITAL_COMMUNITY): Payer: Self-pay

## 2024-10-30 ENCOUNTER — Encounter (HOSPITAL_COMMUNITY)
Admission: RE | Admit: 2024-10-30 | Discharge: 2024-10-30 | Disposition: A | Discharge status: Home / Self Care | Source: Ambulatory Visit | Attending: Pulmonary Disease | Admitting: Pulmonary Disease

## 2024-10-30 DIAGNOSIS — J45909 Unspecified asthma, uncomplicated: Secondary | ICD-10-CM

## 2024-10-30 NOTE — Telephone Encounter (Signed)
 Patient c/o for 1:15 PR class, states she is still on medication for vertigo.

## 2024-10-31 NOTE — Progress Notes (Signed)
 Pulmonary Individual Treatment Plan  Patient Details  Name: Judy [name] MRN: [id number] Date of Birth: 06/20/1946 Referring Provider:   Conrad Ports Pulmonary Rehab Walk Test from 07/25/2024 in [facility]  Referring Provider Dr. Annella    Initial Encounter Date:  Flowsheet Row Pulmonary Rehab Walk Test from 07/25/2024 in [facility]  Date 07/25/24    Visit Diagnosis: Asthma, unspecified asthma severity, unspecified whether complicated, unspecified whether persistent  Patient's Home Medications on Admission:  Current Medications[1]  Past Medical History: Past Medical History:  Diagnosis Date   Allergic rhinitis    Anemia    Anxiety    Arthritis    Bipolar 1 disorder (HCC)    Cancer (HCC)    Depression    Diverticulosis of colon (without mention of hemorrhage) 08/25/2011   Dr. Burnette   Dyspnea    Endometrial cancer (HCC) 04/06/2023   Generalized weakness 07/04/2023   Hearing loss in left ear    since birth   Hyperlipidemia    Hypertension    resolved   Insomnia    Obesity    Palpitation    Renal disorder    Tardive dyskinesia    Tremor    Vertigo     Tobacco Use: Tobacco Use History[2]  Labs: Review Flowsheet  More data exists      Latest Ref Rng & Units 03/02/2022 11/04/2022 11/08/2023 05/08/2024 08/14/2024  Labs for ITP Cardiac and Pulmonary Rehab  Cholestrol 100 - 199 mg/dL 843  848  828  786  804   LDL (calc) 0 - 99 mg/dL 71  63  83  873  899   HDL-C >39 mg/dL 70  72  72  74  82   Trlycerides 0 - 149 mg/dL 77  86  86  76  74   Hemoglobin A1c 4.8 - 5.6 % 5.5  5.6  5.5  - 5.3     Capillary Blood Glucose: Lab Results  Component Value Date   GLUCAP 108 (H) 04/20/2022   GLUCAP 79 11/08/2019   GLUCAP 96 12/10/2013   GLUCAP 98 08/25/2011     Pulmonary Assessment Scores:  Pulmonary Assessment Scores     Row Name 07/25/24 1303          ADL UCSD   ADL Phase Entry     SOB Score total 69       CAT Score   CAT Score 26       mMRC Score   mMRC Score 4       UCSD: Self-administered rating of dyspnea associated with activities of daily living (ADLs) 6-point scale (0 = not at all to 5 = maximal or unable to do because of breathlessness)  Scoring Scores range from 0 to 120.  Minimally important difference is 5 units  CAT: CAT can identify the health impairment of COPD patients and is better correlated with disease progression.  CAT has a scoring range of zero to 40. The CAT score is classified into four groups of low (less than 10), medium (10 - 20), high (21-30) and very high (31-40) based on the impact level of disease on health status. A CAT score over 10 suggests significant symptoms.  A worsening CAT score could be explained by an exacerbation, poor medication adherence, poor inhaler technique, or progression of COPD or comorbid conditions.  CAT MCID is 2 points  mMRC: mMRC (Modified  Medical Research Council) Dyspnea Scale is used to assess the degree of baseline functional disability in patients of respiratory disease due to dyspnea. No minimal important difference is established. A decrease in score of 1 point or greater is considered a positive change.   Pulmonary Function Assessment:  Pulmonary Function Assessment - 07/25/24 1302       Breath   Bilateral Breath Sounds Clear    Shortness of Breath Yes;Limiting activity          Exercise Target Goals: Exercise Program Goal: Individual exercise prescription set using results from initial 6 min walk test and THRR while considering  patients activity barriers and safety.   Exercise Prescription Goal: Initial exercise prescription builds to 30-45 minutes a day of aerobic activity, 2-3 days per week.  Home exercise guidelines will be given to patient during program as part of exercise prescription that the participant will  acknowledge.  Activity Barriers & Risk Stratification:  Activity Barriers & Cardiac Risk Stratification - 07/25/24 1301       Activity Barriers & Cardiac Risk Stratification   Activity Barriers Deconditioning;Muscular Weakness;Shortness of Breath;Back Problems;History of Falls;Balance Concerns          6 Minute Walk:  6 Minute Walk     Row Name 07/25/24 1234         6 Minute Walk   Phase Initial     Distance 600 feet     Walk Time 6 minutes     # of Rest Breaks 1     MPH 1.14     METS 1.96     RPE 13     Perceived Dyspnea  1     VO2 Peak 6.86     Symptoms No     Resting HR 73 bpm     Resting BP 130/70     Resting Oxygen Saturation  98 %     Exercise Oxygen Saturation  during 6 min walk 95 %     Max Ex. HR 107 bpm     Max Ex. BP 132/80     2 Minute Post BP 122/80       Interval HR   1 Minute HR 102     2 Minute HR 106     3 Minute HR 107     4 Minute HR 107     5 Minute HR 99     6 Minute HR 76     2 Minute Post HR 68     Interval Heart Rate? Yes       Interval Oxygen   Interval Oxygen? Yes     Baseline Oxygen Saturation % 98 %     1 Minute Oxygen Saturation % 95 %     1 Minute Liters of Oxygen 0 L     2 Minute Oxygen Saturation % 96 %     2 Minute Liters of Oxygen 0 L     3 Minute Oxygen Saturation % 98 %     3 Minute Liters of Oxygen 0 L     4 Minute Oxygen Saturation % 99 %     4 Minute Liters of Oxygen 0 L     5 Minute Oxygen Saturation % 99 %     5 Minute Liters of Oxygen 0 L     6 Minute Oxygen Saturation % 99 %     6 Minute Liters of Oxygen 0 L     2 Minute Post Oxygen Saturation % 98 %  2 Minute Post Liters of Oxygen 0 L        Oxygen Initial Assessment:  Oxygen Initial Assessment - 07/25/24 1302       Home Oxygen   Home Oxygen Device None    Sleep Oxygen Prescription None    Home Exercise Oxygen Prescription None    Home Resting Oxygen Prescription None      Initial 6 min Walk   Oxygen Used None      Program Oxygen  Prescription   Program Oxygen Prescription None      Intervention   Short Term Goals To learn and understand importance of maintaining oxygen saturations>88%;To learn and demonstrate proper use of respiratory medications;To learn and demonstrate proper pursed lip breathing techniques or other breathing techniques. ;To learn and understand importance of monitoring SPO2 with pulse oximeter and demonstrate accurate use of the pulse oximeter.;To learn and exhibit compliance with exercise, home and travel O2 prescription    Long  Term Goals Exhibits compliance with exercise, home  and travel O2 prescription;Maintenance of O2 saturations>88%;Compliance with respiratory medication;Demonstrates proper use of MDIs;Exhibits proper breathing techniques, such as pursed lip breathing or other method taught during program session;Verbalizes importance of monitoring SPO2 with pulse oximeter and return demonstration          Oxygen Re-Evaluation:  Oxygen Re-Evaluation     Row Name 08/01/24 1451 08/30/24 0908 09/28/24 1344 10/22/24 1417       Program Oxygen Prescription   Program Oxygen Prescription None None None None      Home Oxygen   Home Oxygen Device None None None None    Sleep Oxygen Prescription None None None None    Home Exercise Oxygen Prescription None None None None    Home Resting Oxygen Prescription None None None None      Goals/Expected Outcomes   Short Term Goals To learn and understand importance of maintaining oxygen saturations>88%;To learn and demonstrate proper use of respiratory medications;To learn and demonstrate proper pursed lip breathing techniques or other breathing techniques. ;To learn and understand importance of monitoring SPO2 with pulse oximeter and demonstrate accurate use of the pulse oximeter.;To learn and exhibit compliance with exercise, home and travel O2 prescription To learn and understand importance of maintaining oxygen saturations>88%;To learn and  demonstrate proper use of respiratory medications;To learn and demonstrate proper pursed lip breathing techniques or other breathing techniques. ;To learn and understand importance of monitoring SPO2 with pulse oximeter and demonstrate accurate use of the pulse oximeter.;To learn and exhibit compliance with exercise, home and travel O2 prescription To learn and understand importance of maintaining oxygen saturations>88%;To learn and demonstrate proper use of respiratory medications;To learn and demonstrate proper pursed lip breathing techniques or other breathing techniques. ;To learn and understand importance of monitoring SPO2 with pulse oximeter and demonstrate accurate use of the pulse oximeter.;To learn and exhibit compliance with exercise, home and travel O2 prescription To learn and understand importance of maintaining oxygen saturations>88%;To learn and demonstrate proper use of respiratory medications;To learn and demonstrate proper pursed lip breathing techniques or other breathing techniques. ;To learn and understand importance of monitoring SPO2 with pulse oximeter and demonstrate accurate use of the pulse oximeter.;To learn and exhibit compliance with exercise, home and travel O2 prescription    Long  Term Goals Exhibits compliance with exercise, home  and travel O2 prescription;Maintenance of O2 saturations>88%;Compliance with respiratory medication;Demonstrates proper use of MDIs;Exhibits proper breathing techniques, such as pursed lip breathing or other method taught during program session;Verbalizes importance of  monitoring SPO2 with pulse oximeter and return demonstration Exhibits compliance with exercise, home  and travel O2 prescription;Maintenance of O2 saturations>88%;Compliance with respiratory medication;Demonstrates proper use of MDIs;Exhibits proper breathing techniques, such as pursed lip breathing or other method taught during program session;Verbalizes importance of monitoring SPO2  with pulse oximeter and return demonstration Exhibits compliance with exercise, home  and travel O2 prescription;Maintenance of O2 saturations>88%;Compliance with respiratory medication;Demonstrates proper use of MDIs;Exhibits proper breathing techniques, such as pursed lip breathing or other method taught during program session;Verbalizes importance of monitoring SPO2 with pulse oximeter and return demonstration Exhibits compliance with exercise, home  and travel O2 prescription;Maintenance of O2 saturations>88%;Compliance with respiratory medication;Demonstrates proper use of MDIs;Exhibits proper breathing techniques, such as pursed lip breathing or other method taught during program session;Verbalizes importance of monitoring SPO2 with pulse oximeter and return demonstration    Goals/Expected Outcomes Compliance and understanding of oxygen saturation monitoring and breathing techniques to decrease shortness of breath. Compliance and understanding of oxygen saturation monitoring and breathing techniques to decrease shortness of breath. Compliance and understanding of oxygen saturation monitoring and breathing techniques to decrease shortness of breath. Compliance and understanding of oxygen saturation monitoring and breathing techniques to decrease shortness of breath.       Oxygen Discharge (Final Oxygen Re-Evaluation):  Oxygen Re-Evaluation - 10/22/24 1417       Program Oxygen Prescription   Program Oxygen Prescription None      Home Oxygen   Home Oxygen Device None    Sleep Oxygen Prescription None    Home Exercise Oxygen Prescription None    Home Resting Oxygen Prescription None      Goals/Expected Outcomes   Short Term Goals To learn and understand importance of maintaining oxygen saturations>88%;To learn and demonstrate proper use of respiratory medications;To learn and demonstrate proper pursed lip breathing techniques or other breathing techniques. ;To learn and understand importance  of monitoring SPO2 with pulse oximeter and demonstrate accurate use of the pulse oximeter.;To learn and exhibit compliance with exercise, home and travel O2 prescription    Long  Term Goals Exhibits compliance with exercise, home  and travel O2 prescription;Maintenance of O2 saturations>88%;Compliance with respiratory medication;Demonstrates proper use of MDIs;Exhibits proper breathing techniques, such as pursed lip breathing or other method taught during program session;Verbalizes importance of monitoring SPO2 with pulse oximeter and return demonstration    Goals/Expected Outcomes Compliance and understanding of oxygen saturation monitoring and breathing techniques to decrease shortness of breath.          Initial Exercise Prescription:  Initial Exercise Prescription - 07/25/24 1200       Date of Initial Exercise RX and Referring Provider   Date 07/25/24    Referring Provider Dr. Annella    Expected Discharge Date 10/30/24      NuStep   Level 1    SPM 50    Minutes 30    METs 1.5      Prescription Details   Frequency (times per week) 2    Duration Progress to 30 minutes of continuous aerobic without signs/symptoms of physical distress      Intensity   THRR 40-80% of Max Heartrate 57-114    Ratings of Perceived Exertion 11-13    Perceived Dyspnea 0-4      Progression   Progression Continue to progress workloads to maintain intensity without signs/symptoms of physical distress.      Resistance Training   Training Prescription Yes    Weight yellow bands    Reps 10-15  Perform Capillary Blood Glucose checks as needed.  Exercise Prescription Changes:   Exercise Prescription Changes     Row Name 08/21/24 1500 08/30/24 1521 09/13/24 1152 10/02/24 1500 10/16/24 1400     Response to Exercise   Blood Pressure (Admit) 140/90 128/82 132/80 156/86 126/86   Blood Pressure (Exercise) 140/86 -- -- 148/78 132/80   Blood Pressure (Exit) 120/70 124/80 120/72 122/68  108/70   Heart Rate (Admit) 75 bpm 69 bpm 74 bpm 74 bpm 79 bpm   Heart Rate (Exercise) 85 bpm 72 bpm 78 bpm 82 bpm 104 bpm   Heart Rate (Exit) 73 bpm 60 bpm 63 bpm 63 bpm 58 bpm   Oxygen Saturation (Admit) 98 % 98 % 98 % 97 % 100 %   Oxygen Saturation (Exercise) 99 % 97 % 99 % 97 % 98 %   Oxygen Saturation (Exit) 96 % 97 % 98 % 98 % 97 %   Rating of Perceived Exertion (Exercise) 13 11 14 13 12    Perceived Dyspnea (Exercise) 2 2 2 3 2    Duration Continue with 30 min of aerobic exercise without signs/symptoms of physical distress. Continue with 30 min of aerobic exercise without signs/symptoms of physical distress. Continue with 30 min of aerobic exercise without signs/symptoms of physical distress. Continue with 30 min of aerobic exercise without signs/symptoms of physical distress. Continue with 30 min of aerobic exercise without signs/symptoms of physical distress.   Intensity THRR unchanged THRR unchanged THRR unchanged THRR unchanged THRR unchanged     Progression   Progression Continue to progress workloads to maintain intensity without signs/symptoms of physical distress. Continue to progress workloads to maintain intensity without signs/symptoms of physical distress. Continue to progress workloads to maintain intensity without signs/symptoms of physical distress. Continue to progress workloads to maintain intensity without signs/symptoms of physical distress. Continue to progress workloads to maintain intensity without signs/symptoms of physical distress.     Resistance Training   Training Prescription Yes Yes Yes Yes --   Weight yellow bands yellow bands yellow bands yellow bands yellow bands   Reps 10-15 10-15 10-15 10-15 10-15   Time 10 Minutes 10 Minutes 10 Minutes 10 Minutes 10 Minutes     NuStep   Level 1 1 1 2 2    SPM 28 38 35 25 22   Minutes 30 30 15 15 15    METs 1.2 1.2 1.2 1.2 1.1     Track   Laps -- -- 9 6  159ft track 6  154ft track   Minutes -- -- 15 15 15    METs --  -- 1.63 1.42 1.42      Exercise Comments:   Exercise Goals and Review:   Exercise Goals     Row Name 07/25/24 1302             Exercise Goals   Increase Physical Activity Yes       Intervention Provide advice, education, support and counseling about physical activity/exercise needs.;Develop an individualized exercise prescription for aerobic and resistive training based on initial evaluation findings, risk stratification, comorbidities and participant's personal goals.       Expected Outcomes Short Term: Attend rehab on a regular basis to increase amount of physical activity.;Long Term: Exercising regularly at least 3-5 days a week.;Long Term: Add in home exercise to make exercise part of routine and to increase amount of physical activity.       Increase Strength and Stamina Yes       Intervention Provide advice,  education, support and counseling about physical activity/exercise needs.;Develop an individualized exercise prescription for aerobic and resistive training based on initial evaluation findings, risk stratification, comorbidities and participant's personal goals.       Expected Outcomes Short Term: Increase workloads from initial exercise prescription for resistance, speed, and METs.;Short Term: Perform resistance training exercises routinely during rehab and add in resistance training at home;Long Term: Improve cardiorespiratory fitness, muscular endurance and strength as measured by increased METs and functional capacity ( )       Able to understand and use rate of perceived exertion (RPE) scale Yes       Intervention Provide education and explanation on how to use RPE scale       Expected Outcomes Short Term: Able to use RPE daily in rehab to express subjective intensity level;Long Term:  Able to use RPE to guide intensity level when exercising independently       Able to understand and use Dyspnea scale Yes       Intervention Provide education and explanation on how to  use Dyspnea scale       Expected Outcomes Short Term: Able to use Dyspnea scale daily in rehab to express subjective sense of shortness of breath during exertion;Long Term: Able to use Dyspnea scale to guide intensity level when exercising independently       Knowledge and understanding of Target Heart Rate Range (THRR) Yes       Intervention Provide education and explanation of THRR including how the numbers were predicted and where they are located for reference       Expected Outcomes Short Term: Able to state/look up THRR;Short Term: Able to use daily as guideline for intensity in rehab;Long Term: Able to use THRR to govern intensity when exercising independently       Understanding of Exercise Prescription Yes       Intervention Provide education, explanation, and written materials on patient's individual exercise prescription       Expected Outcomes Short Term: Able to explain program exercise prescription;Long Term: Able to explain home exercise prescription to exercise independently          Exercise Goals Re-Evaluation :  Exercise Goals Re-Evaluation     Row Name 08/01/24 1449 08/30/24 0906 09/28/24 1333 10/22/24 1415       Exercise Goal Re-Evaluation   Exercise Goals Review Increase Physical Activity;Able to understand and use Dyspnea scale;Understanding of Exercise Prescription;Increase Strength and Stamina;Knowledge and understanding of Target Heart Rate Range (THRR);Able to understand and use rate of perceived exertion (RPE) scale Increase Physical Activity;Able to understand and use Dyspnea scale;Understanding of Exercise Prescription;Increase Strength and Stamina;Knowledge and understanding of Target Heart Rate Range (THRR);Able to understand and use rate of perceived exertion (RPE) scale Increase Physical Activity;Able to understand and use Dyspnea scale;Understanding of Exercise Prescription;Increase Strength and Stamina;Knowledge and understanding of Target Heart Rate Range  (THRR);Able to understand and use rate of perceived exertion (RPE) scale Increase Physical Activity;Able to understand and use Dyspnea scale;Understanding of Exercise Prescription;Increase Strength and Stamina;Knowledge and understanding of Target Heart Rate Range (THRR);Able to understand and use rate of perceived exertion (RPE) scale    Comments Pt to begin exercise with pulmonary rehab when she has completed home health. Will progress as tolerated. Pt has completed 3 exercise sessions. She is exercising on the recumbent stepper for 30 min, level 1, METs 1.2. She has not shown any progression yet and moves quite slowly (due to dementia), SPM 33. Will progress as tolerated. She performs  warm up and cool down standing with yellow bands, 3 lbs. She needs demonstrative cues at times. She performs squats with support. She is consistant with attendance. Pt has completed 8 exercise sessions. She is now exercising on the recumbent stepper for 15 min, level 1, METs 1.3 and then walking the track for 15 min, METs 1.56. Walking is a great progression for her. She performs warm up and cool down standing with yellow bands, 3 lbs. She needs demonstrative cues at times. She performs squats with support. She is consistant with attendance. Pt has completed 10 exercise sessions. She has missed 2 sessions recently. She is now exercising on the recumbent stepper for 15 min, level 2, METs 1.1, and then walking the track for 15 min, METs 1.42. Walking is a great progression for her. She performs warm up and cool down standing with yellow bands, 3 lbs. She needs demonstrative cues at times. She performs squats with support.    Expected Outcomes Through exercise at rehab and home, the patient will decrease shortness of breath with daily activities and feel confident in carrying out an exercise regimen at home. Through exercise at rehab and home, the patient will decrease shortness of breath with daily activities and feel confident in  carrying out an exercise regimen at home. Through exercise at rehab and home, the patient will decrease shortness of breath with daily activities and feel confident in carrying out an exercise regimen at home. Through exercise at rehab and home, the patient will decrease shortness of breath with daily activities and feel confident in carrying out an exercise regimen at home.       Discharge Exercise Prescription (Final Exercise Prescription Changes):  Exercise Prescription Changes - 10/16/24 1400       Response to Exercise   Blood Pressure (Admit) 126/86    Blood Pressure (Exercise) 132/80    Blood Pressure (Exit) 108/70    Heart Rate (Admit) 79 bpm    Heart Rate (Exercise) 104 bpm    Heart Rate (Exit) 58 bpm    Oxygen Saturation (Admit) 100 %    Oxygen Saturation (Exercise) 98 %    Oxygen Saturation (Exit) 97 %    Rating of Perceived Exertion (Exercise) 12    Perceived Dyspnea (Exercise) 2    Duration Continue with 30 min of aerobic exercise without signs/symptoms of physical distress.    Intensity THRR unchanged      Progression   Progression Continue to progress workloads to maintain intensity without signs/symptoms of physical distress.      Resistance Training   Weight yellow bands    Reps 10-15    Time 10 Minutes      NuStep   Level 2    SPM 22    Minutes 15    METs 1.1      Track   Laps 6   136ft track   Minutes 15    METs 1.42          Nutrition:  Target Goals: Understanding of nutrition guidelines, daily intake of sodium 1500mg , cholesterol 200mg , calories 30% from fat and 7% or less from saturated fats, daily to have 5 or more servings of fruits and vegetables.  Biometrics:    Nutrition Therapy Plan and Nutrition Goals:   Nutrition Assessments:  MEDIFICTS Score Key: >=70 Need to make dietary changes  40-70 Heart Healthy Diet <= 40 Therapeutic Level Cholesterol Diet   Picture Your Plate Scores: <59 Unhealthy dietary pattern with much room  for  improvement. 41-50 Dietary pattern unlikely to meet recommendations for good health and room for improvement. 51-60 More healthful dietary pattern, with some room for improvement.  >60 Healthy dietary pattern, although there may be some specific behaviors that could be improved.    Nutrition Goals Re-Evaluation:   Nutrition Goals Discharge (Final Nutrition Goals Re-Evaluation):   Psychosocial: Target Goals: Acknowledge presence or absence of significant depression and/or stress, maximize coping skills, provide positive support system. Participant is able to verbalize types and ability to use techniques and skills needed for reducing stress and depression.  Initial Review & Psychosocial Screening:  Initial Psych Review & Screening - 07/25/24 1303       Initial Review   Current issues with History of Depression;Current Psychotropic Meds      Family Dynamics   Good Support System? Yes    Comments Pt has mild cognitive impairment. Her brother, Victory, and his wife Merlynn assist pt. She lives alone, no longer drives. Sees therapist regulary for medication management, hx of bipolar, depression      Barriers   Psychosocial barriers to participate in program There are no identifiable barriers or psychosocial needs.      Screening Interventions   Interventions Encouraged to exercise    Expected Outcomes Short Term goal: Utilizing psychosocial counselor, staff and physician to assist with identification of specific Stressors or current issues interfering with healing process. Setting desired goal for each stressor or current issue identified.;Long Term Goal: Stressors or current issues are controlled or eliminated.;Short Term goal: Identification and review with participant of any Quality of Life or Depression concerns found by scoring the questionnaire.;Long Term goal: The participant improves quality of Life and PHQ9 Scores as seen by post scores and/or verbalization of changes           Quality of Life Scores:  Scores of 19 and below usually indicate a poorer quality of life in these areas.  A difference of  2-3 points is a clinically meaningful difference.  A difference of 2-3 points in the total score of the Quality of Life Index has been associated with significant improvement in overall quality of life, self-image, physical symptoms, and general health in studies assessing change in quality of life.  PHQ-9: Review Flowsheet  More data exists      08/14/2024 12/28/2023 11/08/2023 07/04/2023 12/16/2022  Depression screen PHQ 2/9  Decreased Interest 0 0 2 3 0  Down, Depressed, Hopeless 1 1 2 3  0  PHQ - 2 Score 1 1 4 6  0  Altered sleeping 1 1 0 2 0  Tired, decreased energy 1 1 3 3 2   Change in appetite 0 0 3 3 0  Feeling bad or failure about yourself  0 0 1 3 0  Trouble concentrating 0 0 3 1 0  Moving slowly or fidgety/restless 0 0 0 2 0  Suicidal thoughts 0 0 0 0 0  PHQ-9 Score 3  3  14  20  2    Difficult doing work/chores - Not difficult at all Somewhat difficult - -    Details       Data saved with a previous flowsheet row definition        Interpretation of Total Score  Total Score Depression Severity:  1-4 = Minimal depression, 5-9 = Mild depression, 10-14 = Moderate depression, 15-19 = Moderately severe depression, 20-27 = Severe depression   Psychosocial Evaluation and Intervention:  Psychosocial Evaluation - 07/25/24 1305       Psychosocial Evaluation &  Interventions   Interventions Encouraged to exercise with the program and follow exercise prescription    Comments Pt has mild cognitive impairment. Her brother, Victory, and his wife Merlynn assist pt. She lives alone, no longer drives. Sees therapist regulary for medication management, hx of bipolar, depression    Expected Outcomes For Vassie to participate in PR free of any psy/soc barriers or concerns    Continue Psychosocial Services  Follow up required by staff          Psychosocial  Re-Evaluation:  Psychosocial Re-Evaluation     Row Name 08/03/24 1419 09/03/24 1034 09/26/24 1347 10/30/24 0829       Psychosocial Re-Evaluation   Current issues with History of Depression;Current Psychotropic Meds History of Depression;Current Psychotropic Meds History of Depression;Current Psychotropic Meds History of Depression;Current Psychotropic Meds    Comments Monthly psychosocial re-evaluation as follows: No changes since assessment. Syliva has not started the program yet. She is waiting to finish home PT first. Monthly psychosocial re-evaluation as follows: Carloyn has started the program and is making new friends. She stated that she likes coming, states exercising is okay. She denies any psy/soc barriers or concerns. She states she has good support from her brother and her sister-in-law. Monthly psychosocial re-evaluation as follows: Makira has completed 7 sessions so far. Though she doesnt like exercising she likes coming to class to get out of the house and socialize. She denies any psy/soc barriers or concerns. She states she has good support from her brother and her sister-in-law. Monthly psychosocial re-evaluation as follows: Delrose has missed class the past week due to vertigo. She is taking medication. Flynn enjoys coming to class to get her out of the house. She states her mental health is stable. She denies any new psy/soc barriers or concerns. She states she has good support from her brother and her sister-in-law.    Expected Outcomes For Kallen to participate in PR free of any psy/soc barriers or concerns For Katye to participate in PR free of any psy/soc barriers or concerns For Umaiza to participate in PR free of any psy/soc barriers or concerns For Lillyauna to participate in PR free of any psy/soc barriers or concerns    Interventions Encouraged to attend Pulmonary Rehabilitation for the exercise Encouraged to attend Pulmonary Rehabilitation for the exercise Encouraged to attend  Pulmonary Rehabilitation for the exercise Encouraged to attend Pulmonary Rehabilitation for the exercise    Continue Psychosocial Services  Follow up required by staff Follow up required by staff Follow up required by staff Follow up required by staff       Psychosocial Discharge (Final Psychosocial Re-Evaluation):  Psychosocial Re-Evaluation - 10/30/24 0829       Psychosocial Re-Evaluation   Current issues with History of Depression;Current Psychotropic Meds    Comments Monthly psychosocial re-evaluation as follows: Kaleah has missed class the past week due to vertigo. She is taking medication. Juliya enjoys coming to class to get her out of the house. She states her mental health is stable. She denies any new psy/soc barriers or concerns. She states she has good support from her brother and her sister-in-law.    Expected Outcomes For Weslee to participate in PR free of any psy/soc barriers or concerns    Interventions Encouraged to attend Pulmonary Rehabilitation for the exercise    Continue Psychosocial Services  Follow up required by staff          Education: Education Goals: Education classes will be provided on a weekly basis,  covering required topics. Participant will state understanding/return demonstration of topics presented.  Learning Barriers/Preferences:  Learning Barriers/Preferences - 07/25/24 1306       Learning Barriers/Preferences   Learning Barriers Inability to learn new things;Exercise Concerns;Reading    Learning Preferences Individual Instruction;Verbal Instruction;Written Material          Education Topics: Know Your Numbers Group instruction that is supported by a PowerPoint presentation. Instructor discusses importance of knowing and understanding resting, exercise, and post-exercise oxygen saturation, heart rate, and blood pressure. Oxygen saturation, heart rate, blood pressure, rating of perceived exertion, and dyspnea are reviewed along with a normal  range for these values.  Flowsheet Row PULMONARY REHAB OTHER RESPIRATORY from 09/27/2024 in [facility]  Date 09/27/24  Educator EP  Instruction Review Code 1- Verbalizes Understanding    Exercise for the Pulmonary Patient Group instruction that is supported by a PowerPoint presentation. Instructor discusses benefits of exercise, core components of exercise, frequency, duration, and intensity of an exercise routine, importance of utilizing pulse oximetry during exercise, safety while exercising, and options of places to exercise outside of rehab.  Flowsheet Row PULMONARY REHAB OTHER RESPIRATORY from 09/20/2024 in [facility]  Date 09/20/24  Educator EP  Instruction Review Code 1- Verbalizes Understanding    MET Level  Group instruction provided by PowerPoint, verbal discussion, and written material to support subject matter. Instructor reviews what METs are and how to increase METs.    Pulmonary Medications Verbally interactive group education provided by instructor with focus on inhaled medications and proper administration. Flowsheet Row PULMONARY REHAB OTHER RESPIRATORY from 09/13/2024 in [facility]  Date 09/13/24  Educator RT  Instruction Review Code 1- Verbalizes Understanding    Anatomy and Physiology of the Respiratory System Group instruction provided by PowerPoint, verbal discussion, and written material to support subject matter. Instructor reviews respiratory cycle and anatomical components of the respiratory system and their functions. Instructor also reviews differences in obstructive and restrictive respiratory diseases with examples of each.  Flowsheet Row PULMONARY REHAB OTHER RESPIRATORY from 08/30/2024 in [facility]  Date 08/30/24   Educator RT  Instruction Review Code 1- Verbalizes Understanding    Oxygen Safety Group instruction provided by PowerPoint, verbal discussion, and written material to support subject matter. There is an overview of What is Oxygen and Why do we need it.  Instructor also reviews how to create a safe environment for oxygen use, the importance of using oxygen as prescribed, and the risks of noncompliance. There is a brief discussion on traveling with oxygen and resources the patient may utilize.   Oxygen Use Group instruction provided by PowerPoint, verbal discussion, and written material to discuss how supplemental oxygen is prescribed and different types of oxygen supply systems. Resources for more information are provided.    Breathing Techniques Group instruction that is supported by demonstration and informational handouts. Instructor discusses the benefits of pursed lip and diaphragmatic breathing and detailed demonstration on how to perform both.     Risk Factor Reduction Group instruction that is supported by a PowerPoint presentation. Instructor discusses the definition of a risk factor, different risk factors for pulmonary disease, and how the heart and lungs work together.   Pulmonary Diseases Group instruction provided by PowerPoint, verbal discussion, and written material to support subject matter. Instructor gives  an overview of the different type of pulmonary diseases. There is also a discussion on risk factors and symptoms as well as ways to manage the diseases. Flowsheet Row PULMONARY REHAB OTHER RESPIRATORY from 08/23/2024 in [facility]  Date 08/23/24  Educator RT  Instruction Review Code 1- Verbalizes Understanding    Stress and Energy Conservation Group instruction provided by PowerPoint, verbal discussion, and written material to support subject matter. Instructor gives an overview of stress and the impact  it can have on the body. Instructor also reviews ways to reduce stress. There is also a discussion on energy conservation and ways to conserve energy throughout the day.   Warning Signs and Symptoms Group instruction provided by PowerPoint, verbal discussion, and written material to support subject matter. Instructor reviews warning signs and symptoms of stroke, heart attack, cold and flu. Instructor also reviews ways to prevent the spread of infection.   Other Education Group or individual verbal, written, or video instructions that support the educational goals of the pulmonary rehab program.    Knowledge Questionnaire Score:  Knowledge Questionnaire Score - 07/25/24 1306       Knowledge Questionnaire Score   Pre Score --   unable to complete d/t cognitive impairment         Core Components/Risk Factors/Patient Goals at Admission:  Personal Goals and Risk Factors at Admission - 07/25/24 1307       Core Components/Risk Factors/Patient Goals on Admission   Improve shortness of breath with ADL's Yes    Intervention Provide education, individualized exercise plan and daily activity instruction to help decrease symptoms of SOB with activities of daily living.    Expected Outcomes Short Term: Improve cardiorespiratory fitness to achieve a reduction of symptoms when performing ADLs;Long Term: Be able to perform more ADLs without symptoms or delay the onset of symptoms    Increase knowledge of respiratory medications and ability to use respiratory devices properly  Yes    Intervention Provide education and demonstration as needed of appropriate use of medications, inhalers, and oxygen therapy.    Expected Outcomes Short Term: Achieves understanding of medications use. Understands that oxygen is a medication prescribed by physician. Demonstrates appropriate use of inhaler and oxygen therapy.;Long Term: Maintain appropriate use of medications, inhalers, and oxygen therapy.          Core  Components/Risk Factors/Patient Goals Review:   Goals and Risk Factor Review     Row Name 08/03/24 1421 09/03/24 1043 09/26/24 1349 10/30/24 0831       Core Components/Risk Factors/Patient Goals Review   Personal Goals Review Improve shortness of breath with ADL's;Develop more efficient breathing techniques such as purse lipped breathing and diaphragmatic breathing and practicing self-pacing with activity.;Increase knowledge of respiratory medications and ability to use respiratory devices properly. Improve shortness of breath with ADL's;Develop more efficient breathing techniques such as purse lipped breathing and diaphragmatic breathing and practicing self-pacing with activity.;Increase knowledge of respiratory medications and ability to use respiratory devices properly. Improve shortness of breath with ADL's;Develop more efficient breathing techniques such as purse lipped breathing and diaphragmatic breathing and practicing self-pacing with activity. Improve shortness of breath with ADL's    Review Monthly review of patient's Core Components/Risk Factors/Patient Goals are as follows: Unable to assess. Dhiya has not started the program yet. She is due to start after she finishes HH PT. Monthly review of patient's Core Components/Risk Factors/Patient Goals are as follows:  Goal in progress for improving  her shortness of breath with ADLs. Jullian is trying to build up her strength and endurance. She has completed 4 sessions so far and is exercising on the NuStep for 30 minutes. Her oxygen saturation has been in a normal range on room air with exertion. Goal progressing for developing more efficient breathing techniques such as purse lipped breathing and diaphragmatic breathing; and practicing self-pacing with activity. Syliva needs to be prompted to perform purse lipped breathing while short of breath. We work on this with her while performing the warmup and while exercising. She is working on diaphragmatic  breathing at home. Goal met on increasing her knowledge of respiratory medications and the ability to use her devices correctly. Evani worked with our respiratory therapist on how to properly use her inhalers. Staff have discussed how to use, when to use, side effects and contraindications with her. All questions were answered, and she understood without assistance. Judieth will continue to benefit from PR for nutrition, education, exercise, and lifestyle modification. Monthly review of patient's Core Components/Risk Factors/Patient Goals are as follows: Goal in progress for improving her shortness of breath with ADLs. Yazmyn is trying to build up her strength and endurance. She has completed 7 sessions so far and is exercising on the NuStep and is now walking the track. Her oxygen saturation has been in a normal range on room air with exertion. Goal met for developing more efficient breathing techniques such as purse lipped breathing and diaphragmatic breathing; and practicing self-pacing with activity. Syliva can perform purse lipped breathing while short of breath. She can perform PLB with the warmup and while exercising. She is working on diaphragmatic breathing at home. Anavey will continue to benefit from PR for nutrition, education, exercise, and lifestyle modification. Monthly review of patient's Core Components/Risk Factors/Patient Goals are as follows: Goal in progress for improving her shortness of breath with ADLs. Geet is trying to build up her strength and endurance. She is exercising on the NuStep and is now walking the track. Her oxygen saturation has been in a normal range on room air with exertion. Sabrine will continue to benefit from PR for nutrition, education, exercise, and lifestyle modification.    Expected Outcomes Pt will show progress toward meeting expected goals and outcomes. Pt will show progress toward meeting expected goals and outcomes. Pt will show progress toward meeting expected  goals and outcomes. Pt will show progress toward meeting expected goals and outcomes.       Core Components/Risk Factors/Patient Goals at Discharge (Final Review):   Goals and Risk Factor Review - 10/30/24 0831       Core Components/Risk Factors/Patient Goals Review   Personal Goals Review Improve shortness of breath with ADL's    Review Monthly review of patient's Core Components/Risk Factors/Patient Goals are as follows: Goal in progress for improving her shortness of breath with ADLs. Itzabella is trying to build up her strength and endurance. She is exercising on the NuStep and is now walking the track. Her oxygen saturation has been in a normal range on room air with exertion. Camielle will continue to benefit from PR for nutrition, education, exercise, and lifestyle modification.    Expected Outcomes Pt will show progress toward meeting expected goals and outcomes.          ITP Comments:   Comments: Pt is making expected progress toward Pulmonary Rehab goals after completing 10 session(s). Recommend continued exercise, life style modification, education, and utilization of breathing techniques to increase stamina and strength, while also  decreasing shortness of breath with exertion.  Dr. Slater Staff is Medical Director for Pulmonary Rehab at [facility].       [1]  Current Outpatient Medications:    acetaminophen  (TYLENOL ) 325 MG tablet, Take 2 tablets (650 mg total) by mouth every 6 (six) hours as needed for mild pain (or Fever >/= 101)., Disp: , Rfl:    albuterol  (VENTOLIN  HFA) 108 (90 Base) MCG/ACT inhaler, Inhale 2 puffs into the lungs every 6 (six) hours as needed for wheezing or shortness of breath., Disp: 18 each, Rfl: 2   aspirin  EC 81 MG tablet, Take 1 tablet (81 mg total) by mouth daily. Swallow whole., Disp: 90 tablet, Rfl: 3   atorvastatin  (LIPITOR) 10 MG tablet, TAKE 1 TABLET BY MOUTH EVERY DAY, Disp: 90 tablet, Rfl: 1   b complex vitamins capsule, Take 1  capsule by mouth daily. With C, Disp: , Rfl:    budesonide  (PULMICORT ) 0.5 MG/2ML nebulizer solution, Take 2 mLs (0.5 mg total) by nebulization 2 (two) times daily. DX:J45.40, Disp: 120 mL, Rfl: 12   buPROPion  (WELLBUTRIN  XL) 150 MG 24 hr tablet, TAKE 1 TABLET BY MOUTH EVERY DAY, Disp: 90 tablet, Rfl: 1   cholecalciferol (VITAMIN D3) 25 MCG (1000 UNIT) tablet, Take 1,000 Units by mouth daily., Disp: , Rfl:    INGREZZA  80 MG capsule, Take 1 capsule (80 mg total) by mouth daily., Disp: 30 capsule, Rfl: 5   ipratropium-albuterol  (DUONEB) 0.5-2.5 (3) MG/3ML SOLN, Take 3 mLs by nebulization 3 (three) times daily as needed. 1 treatment in the morning, 1 treatment in the evenings, every 6 hours as needed in between for shortness of breath or wheezing DX: J45.40, Disp: 270 mL, Rfl: 12   meclizine  (ANTIVERT ) 12.5 MG tablet, Take 1 tablet (12.5 mg total) by mouth 3 (three) times daily as needed for dizziness., Disp: 30 tablet, Rfl: 2   metoprolol  succinate (TOPROL -XL) 25 MG 24 hr tablet, Take 1 tablet (25 mg total) by mouth daily. Take with or immediately following a meal., Disp: 90 tablet, Rfl: 3   mirtazapine  (REMERON ) 7.5 MG tablet, Take 0.5 tablets (3.75 mg total) by mouth at bedtime., Disp: 45 tablet, Rfl: 1   Omega-3 Fatty Acids (FISH OIL BURP-LESS PO), Take 700 mg by mouth daily., Disp: , Rfl:    omeprazole  (PRILOSEC) 20 MG capsule, Take 1 capsule (20 mg total) by mouth 2 (two) times daily before a meal., Disp: 14 capsule, Rfl: 0   polyethylene glycol (MIRALAX  / GLYCOLAX ) 17 g packet, Take 17 g by mouth daily., Disp: 30 each, Rfl: 2   propranolol  (INDERAL ) 10 MG tablet, TAKE 1 TABLET BY MOUTH THREE TIMES A DAY AS NEEDED, Disp: 270 tablet, Rfl: 2 [2]  Social History Tobacco Use  Smoking Status Never  Smokeless Tobacco Never

## 2024-11-01 ENCOUNTER — Telehealth (HOSPITAL_COMMUNITY): Payer: Self-pay

## 2024-11-01 ENCOUNTER — Encounter (HOSPITAL_COMMUNITY): Admission: RE | Admit: 2024-11-01 | Source: Ambulatory Visit

## 2024-11-01 NOTE — Telephone Encounter (Signed)
 Patient called to cancel remaining pulmonary rehab sessions, stated she cannot continue with her vertigo. Cancelled appts, removed from schedule.

## 2024-11-06 ENCOUNTER — Encounter (HOSPITAL_COMMUNITY)

## 2024-11-06 NOTE — Progress Notes (Signed)
 Discharge Progress Report  Patient Details  Name: Judy [name] MRN: [id number] Date of Birth: 1946/04/26 Referring Provider:   [facility] Walk Test from 07/25/2024 in [facility]  Referring Provider Dr. Annella     Number of Visits: 10  Reason for Discharge:  Early Exit:  Medical issues  Smoking History:  Tobacco Use History[1]  Diagnosis:  Asthma, unspecified asthma severity, unspecified whether complicated, unspecified whether persistent  ADL UCSD:  Pulmonary Assessment Scores     Row Name 07/25/24 1303         ADL UCSD   ADL Phase Entry     SOB Score total 69       CAT Score   CAT Score 26       mMRC Score   mMRC Score 4        Initial Exercise Prescription:  Initial Exercise Prescription - 07/25/24 1200       Date of Initial Exercise RX and Referring Provider   Date 07/25/24    Referring Provider Dr. Annella    Expected Discharge Date 10/30/24      NuStep   Level 1    SPM 50    Minutes 30    METs 1.5      Prescription Details   Frequency (times per week) 2    Duration Progress to 30 minutes of continuous aerobic without signs/symptoms of physical distress      Intensity   THRR 40-80% of Max Heartrate 57-114    Ratings of Perceived Exertion 11-13    Perceived Dyspnea 0-4      Progression   Progression Continue to progress workloads to maintain intensity without signs/symptoms of physical distress.      Resistance Training   Training Prescription Yes    Weight yellow bands    Reps 10-15          Discharge Exercise Prescription (Final Exercise Prescription Changes):  Exercise Prescription Changes - 10/16/24 1400       Response to Exercise   Blood Pressure (Admit) 126/86    Blood Pressure (Exercise) 132/80    Blood Pressure (Exit) 108/70    Heart Rate (Admit) 79 bpm    Heart Rate (Exercise) 104 bpm    Heart Rate (Exit) 58 bpm    Oxygen Saturation  (Admit) 100 %    Oxygen Saturation (Exercise) 98 %    Oxygen Saturation (Exit) 97 %    Rating of Perceived Exertion (Exercise) 12    Perceived Dyspnea (Exercise) 2    Duration Continue with 30 min of aerobic exercise without signs/symptoms of physical distress.    Intensity THRR unchanged      Progression   Progression Continue to progress workloads to maintain intensity without signs/symptoms of physical distress.      Resistance Training   Weight yellow bands    Reps 10-15    Time 10 Minutes      NuStep   Level 2    SPM 22    Minutes 15    METs 1.1      Track   Laps 6   159ft track   Minutes 15    METs 1.42          Functional Capacity:  6 Minute Walk     Row Name 07/25/24 1234         6 Minute Walk   Phase Initial     Distance 600 feet  Walk Time 6 minutes     # of Rest Breaks 1     MPH 1.14     METS 1.96     RPE 13     Perceived Dyspnea  1     VO2 Peak 6.86     Symptoms No     Resting HR 73 bpm     Resting BP 130/70     Resting Oxygen Saturation  98 %     Exercise Oxygen Saturation  during 6 min walk 95 %     Max Ex. HR 107 bpm     Max Ex. BP 132/80     2 Minute Post BP 122/80       Interval HR   1 Minute HR 102     2 Minute HR 106     3 Minute HR 107     4 Minute HR 107     5 Minute HR 99     6 Minute HR 76     2 Minute Post HR 68     Interval Heart Rate? Yes       Interval Oxygen   Interval Oxygen? Yes     Baseline Oxygen Saturation % 98 %     1 Minute Oxygen Saturation % 95 %     1 Minute Liters of Oxygen 0 L     2 Minute Oxygen Saturation % 96 %     2 Minute Liters of Oxygen 0 L     3 Minute Oxygen Saturation % 98 %     3 Minute Liters of Oxygen 0 L     4 Minute Oxygen Saturation % 99 %     4 Minute Liters of Oxygen 0 L     5 Minute Oxygen Saturation % 99 %     5 Minute Liters of Oxygen 0 L     6 Minute Oxygen Saturation % 99 %     6 Minute Liters of Oxygen 0 L     2 Minute Post Oxygen Saturation % 98 %     2 Minute Post  Liters of Oxygen 0 L        Psychological, QOL, Others - Outcomes: PHQ 2/9:    08/14/2024   11:24 AM 12/28/2023    2:54 PM 11/08/2023   11:53 AM 07/04/2023   12:16 PM 12/16/2022    2:50 PM  Depression screen PHQ 2/9  Decreased Interest 0 0 2 3 0  Down, Depressed, Hopeless 1 1 2 3  0  PHQ - 2 Score 1 1 4 6  0  Altered sleeping 1 1 0 2 0  Tired, decreased energy 1 1 3 3 2   Change in appetite 0 0 3 3 0  Feeling bad or failure about yourself  0 0 1 3 0  Trouble concentrating 0 0 3 1 0  Moving slowly or fidgety/restless 0 0 0 2 0  Suicidal thoughts 0 0 0 0 0  PHQ-9 Score 3  3  14  20  2    Difficult doing work/chores  Not difficult at all Somewhat difficult       Data saved with a previous flowsheet row definition    Quality of Life:   Personal Goals: Goals established at orientation with interventions provided to work toward goal.  Personal Goals and Risk Factors at Admission - 07/25/24 1307       Core Components/Risk Factors/Patient Goals on Admission   Improve shortness of breath with ADL's Yes  Intervention Provide education, individualized exercise plan and daily activity instruction to help decrease symptoms of SOB with activities of daily living.    Expected Outcomes Short Term: Improve cardiorespiratory fitness to achieve a reduction of symptoms when performing ADLs;Long Term: Be able to perform more ADLs without symptoms or delay the onset of symptoms    Increase knowledge of respiratory medications and ability to use respiratory devices properly  Yes    Intervention Provide education and demonstration as needed of appropriate use of medications, inhalers, and oxygen therapy.    Expected Outcomes Short Term: Achieves understanding of medications use. Understands that oxygen is a medication prescribed by physician. Demonstrates appropriate use of inhaler and oxygen therapy.;Long Term: Maintain appropriate use of medications, inhalers, and oxygen therapy.            Personal Goals Discharge:  Goals and Risk Factor Review     Row Name 08/03/24 1421 09/03/24 1043 09/26/24 1349 10/30/24 0831 11/06/24 1513     Core Components/Risk Factors/Patient Goals Review   Personal Goals Review Improve shortness of breath with ADL's;Develop more efficient breathing techniques such as purse lipped breathing and diaphragmatic breathing and practicing self-pacing with activity.;Increase knowledge of respiratory medications and ability to use respiratory devices properly. Improve shortness of breath with ADL's;Develop more efficient breathing techniques such as purse lipped breathing and diaphragmatic breathing and practicing self-pacing with activity.;Increase knowledge of respiratory medications and ability to use respiratory devices properly. Improve shortness of breath with ADL's;Develop more efficient breathing techniques such as purse lipped breathing and diaphragmatic breathing and practicing self-pacing with activity. Improve shortness of breath with ADL's Improve shortness of breath with ADL's   Review Monthly review of patient's Core Components/Risk Factors/Patient Goals are as follows: Unable to assess. Allison has not started the program yet. She is due to start after she finishes HH PT. Monthly review of patient's Core Components/Risk Factors/Patient Goals are as follows:  Goal in progress for improving her shortness of breath with ADLs. Lucill is trying to build up her strength and endurance. She has completed 4 sessions so far and is exercising on the NuStep for 30 minutes. Her oxygen saturation has been in a normal range on room air with exertion. Goal progressing for developing more efficient breathing techniques such as purse lipped breathing and diaphragmatic breathing; and practicing self-pacing with activity. Syliva needs to be prompted to perform purse lipped breathing while short of breath. We work on this with her while performing the warmup and while exercising.  She is working on diaphragmatic breathing at home. Goal met on increasing her knowledge of respiratory medications and the ability to use her devices correctly. Kery worked with our respiratory therapist on how to properly use her inhalers. Staff have discussed how to use, when to use, side effects and contraindications with her. All questions were answered, and she understood without assistance. Aslyn will continue to benefit from PR for nutrition, education, exercise, and lifestyle modification. Monthly review of patient's Core Components/Risk Factors/Patient Goals are as follows: Goal in progress for improving her shortness of breath with ADLs. Buffy is trying to build up her strength and endurance. She has completed 7 sessions so far and is exercising on the NuStep and is now walking the track. Her oxygen saturation has been in a normal range on room air with exertion. Goal met for developing more efficient breathing techniques such as purse lipped breathing and diaphragmatic breathing; and practicing self-pacing with activity. Syliva can perform purse lipped breathing while short of  breath. She can perform PLB with the warmup and while exercising. She is working on diaphragmatic breathing at home. Lineth will continue to benefit from PR for nutrition, education, exercise, and lifestyle modification. Monthly review of patient's Core Components/Risk Factors/Patient Goals are as follows: Goal in progress for improving her shortness of breath with ADLs. Marcie is trying to build up her strength and endurance. She is exercising on the NuStep and is now walking the track. Her oxygen saturation has been in a normal range on room air with exertion. Tressie will continue to benefit from PR for nutrition, education, exercise, and lifestyle modification. Sharifah was discharged from the program on 10/16/24. She only attended 10 sessions. Unfortunately, she did not meet her goal for improving shortness of breath with ADL's.  Chloe was a pleasure to have in class and we wish her the best.   Expected Outcomes Pt will show progress toward meeting expected goals and outcomes. Pt will show progress toward meeting expected goals and outcomes. Pt will show progress toward meeting expected goals and outcomes. Pt will show progress toward meeting expected goals and outcomes. For Zayley to continue to exercise post discharge      Exercise Goals and Review:  Exercise Goals     Row Name 07/25/24 1302             Exercise Goals   Increase Physical Activity Yes       Intervention Provide advice, education, support and counseling about physical activity/exercise needs.;Develop an individualized exercise prescription for aerobic and resistive training based on initial evaluation findings, risk stratification, comorbidities and participant's personal goals.       Expected Outcomes Short Term: Attend rehab on a regular basis to increase amount of physical activity.;Long Term: Exercising regularly at least 3-5 days a week.;Long Term: Add in home exercise to make exercise part of routine and to increase amount of physical activity.       Increase Strength and Stamina Yes       Intervention Provide advice, education, support and counseling about physical activity/exercise needs.;Develop an individualized exercise prescription for aerobic and resistive training based on initial evaluation findings, risk stratification, comorbidities and participant's personal goals.       Expected Outcomes Short Term: Increase workloads from initial exercise prescription for resistance, speed, and METs.;Short Term: Perform resistance training exercises routinely during rehab and add in resistance training at home;Long Term: Improve cardiorespiratory fitness, muscular endurance and strength as measured by increased METs and functional capacity ( )       Able to understand and use rate of perceived exertion (RPE) scale Yes       Intervention Provide  education and explanation on how to use RPE scale       Expected Outcomes Short Term: Able to use RPE daily in rehab to express subjective intensity level;Long Term:  Able to use RPE to guide intensity level when exercising independently       Able to understand and use Dyspnea scale Yes       Intervention Provide education and explanation on how to use Dyspnea scale       Expected Outcomes Short Term: Able to use Dyspnea scale daily in rehab to express subjective sense of shortness of breath during exertion;Long Term: Able to use Dyspnea scale to guide intensity level when exercising independently       Knowledge and understanding of Target Heart Rate Range (THRR) Yes       Intervention Provide education and explanation of THRR  including how the numbers were predicted and where they are located for reference       Expected Outcomes Short Term: Able to state/look up THRR;Short Term: Able to use daily as guideline for intensity in rehab;Long Term: Able to use THRR to govern intensity when exercising independently       Understanding of Exercise Prescription Yes       Intervention Provide education, explanation, and written materials on patient's individual exercise prescription       Expected Outcomes Short Term: Able to explain program exercise prescription;Long Term: Able to explain home exercise prescription to exercise independently          Exercise Goals Re-Evaluation:  Exercise Goals Re-Evaluation     Row Name 08/01/24 1449 08/30/24 0906 09/28/24 1333 10/22/24 1415 11/02/24 0933     Exercise Goal Re-Evaluation   Exercise Goals Review Increase Physical Activity;Able to understand and use Dyspnea scale;Understanding of Exercise Prescription;Increase Strength and Stamina;Knowledge and understanding of Target Heart Rate Range (THRR);Able to understand and use rate of perceived exertion (RPE) scale Increase Physical Activity;Able to understand and use Dyspnea scale;Understanding of Exercise  Prescription;Increase Strength and Stamina;Knowledge and understanding of Target Heart Rate Range (THRR);Able to understand and use rate of perceived exertion (RPE) scale Increase Physical Activity;Able to understand and use Dyspnea scale;Understanding of Exercise Prescription;Increase Strength and Stamina;Knowledge and understanding of Target Heart Rate Range (THRR);Able to understand and use rate of perceived exertion (RPE) scale Increase Physical Activity;Able to understand and use Dyspnea scale;Understanding of Exercise Prescription;Increase Strength and Stamina;Knowledge and understanding of Target Heart Rate Range (THRR);Able to understand and use rate of perceived exertion (RPE) scale Increase Physical Activity;Able to understand and use Dyspnea scale;Understanding of Exercise Prescription;Increase Strength and Stamina;Knowledge and understanding of Target Heart Rate Range (THRR);Able to understand and use rate of perceived exertion (RPE) scale   Comments Pt to begin exercise with pulmonary rehab when she has completed home health. Will progress as tolerated. Pt has completed 3 exercise sessions. She is exercising on the recumbent stepper for 30 min, level 1, METs 1.2. She has not shown any progression yet and moves quite slowly (due to dementia), SPM 33. Will progress as tolerated. She performs warm up and cool down standing with yellow bands, 3 lbs. She needs demonstrative cues at times. She performs squats with support. She is consistant with attendance. Pt has completed 8 exercise sessions. She is now exercising on the recumbent stepper for 15 min, level 1, METs 1.3 and then walking the track for 15 min, METs 1.56. Walking is a great progression for her. She performs warm up and cool down standing with yellow bands, 3 lbs. She needs demonstrative cues at times. She performs squats with support. She is consistant with attendance. Pt has completed 10 exercise sessions. She has missed 2 sessions recently.  She is now exercising on the recumbent stepper for 15 min, level 2, METs 1.1, and then walking the track for 15 min, METs 1.42. Walking is a great progression for her. She performs warm up and cool down standing with yellow bands, 3 lbs. She needs demonstrative cues at times. She performs squats with support. Pt completed 10 exercise sessions. She developed vertigo and ultimately had to discharge from Pulmonary Rehab.  Her METs did not significantly increase in PR but she did exercise for 30 min, including 15 min of walking. We were not able to discuss exercise at home before she was discharged.   Expected Outcomes Through exercise at rehab and home,  the patient will decrease shortness of breath with daily activities and feel confident in carrying out an exercise regimen at home. Through exercise at rehab and home, the patient will decrease shortness of breath with daily activities and feel confident in carrying out an exercise regimen at home. Through exercise at rehab and home, the patient will decrease shortness of breath with daily activities and feel confident in carrying out an exercise regimen at home. Through exercise at rehab and home, the patient will decrease shortness of breath with daily activities and feel confident in carrying out an exercise regimen at home. Through exercise at rehab and home, the patient will decrease shortness of breath with daily activities and feel confident in carrying out an exercise regimen at home.      Nutrition & Weight - Outcomes:    Nutrition:   Nutrition Discharge:   Education Questionnaire Score:  Knowledge Questionnaire Score - 07/25/24 1306       Knowledge Questionnaire Score   Pre Score --   unable to complete d/t cognitive impairment         Goals reviewed with patient; copy given to patient.    [1]  Social History Tobacco Use  Smoking Status Never  Smokeless Tobacco Never

## 2024-11-08 ENCOUNTER — Encounter (HOSPITAL_COMMUNITY)

## 2024-11-12 NOTE — Progress Notes (Unsigned)
 LILLETTE Kristeen JINNY Gladis, CMA,acting as a neurosurgeon for Judy Ada, FNP.,have documented all relevant documentation on the behalf of Judy Ada, FNP,as directed by  Judy Ada, FNP while in the presence of Judy Ada, FNP.  Subjective:    Patient ID: Judy [name] , female    DOB: 12-13-1945 , 79 y.o.   MRN: [id number]  No chief complaint on file.   HPI  HPI   Past Medical History:  Diagnosis Date   Allergic rhinitis    Anemia    Anxiety    Arthritis    Bipolar 1 disorder (HCC)    Cancer (HCC)    Depression    Diverticulosis of colon (without mention of hemorrhage) 08/25/2011   Dr. Burnette   Dyspnea    Endometrial cancer [facility]) 04/06/2023   Generalized weakness 07/04/2023   Hearing loss in left ear    since birth   Hyperlipidemia    Hypertension    resolved   Insomnia    Obesity    Palpitation    Renal disorder    Tardive dyskinesia    Tremor    Vertigo      Family History  Problem Relation Age of Onset   Hypertension Mother    Kidney disease Mother    Stomach cancer Father    Hyperlipidemia Sister    Hypothyroidism Sister    Prostate cancer Brother    Hypertension Brother    Kidney disease Brother    Stroke Brother    Prostate cancer Brother    Breast cancer Neg Hx    Pancreatic cancer Neg Hx    Ovarian cancer Neg Hx    Endometrial cancer Neg Hx     Current Medications[1]   Allergies[2]    The patient states she uses {contraceptive methods:5051} for birth control. No LMP recorded. Patient is postmenopausal.. {Dysmenorrhea-menorrhagia:21918}. Negative for: breast discharge, breast lump(s), breast pain and breast self exam. Associated symptoms include abnormal vaginal bleeding. Pertinent negatives include abnormal bleeding (hematology), anxiety, decreased libido, depression, difficulty falling sleep, dyspareunia, history of infertility, nocturia, sexual dysfunction, sleep disturbances, urinary incontinence, urinary urgency, vaginal discharge and vaginal  itching. Diet regular.The patient states her exercise level is    . The patient's tobacco use is: Tobacco Use History[3]. She has been exposed to passive smoke. The patient's alcohol  use is:  Social History   Substance and Sexual Activity  Alcohol  Use Never  . Additional information: Last pap ***, next one scheduled for ***.    Review of Systems   There were no vitals filed for this visit. There is no height or weight on file to calculate BMI.  Wt Readings from Last 3 Encounters:  10/16/24 133 lb 13.1 oz (60.7 kg)  10/12/24 134 lb (60.8 kg)  10/02/24 138 lb 7.2 oz (62.8 kg)     Objective:  Physical Exam      Assessment And Plan:     Assessment & Plan Encounter for annual health examination     Essential hypertension     Abnormal glucose     History of bipolar disorder        No follow-ups on file. Patient was given opportunity to ask questions. Patient verbalized understanding of the plan and was able to repeat key elements of the plan. All questions were answered to their satisfaction.   Judy Ada, FNP  I, Judy Ada, FNP, have reviewed all documentation for this visit. The documentation on 11/12/24 for the exam, diagnosis, procedures, and orders are all accurate  and complete.     [1]  Current Outpatient Medications:    acetaminophen  (TYLENOL ) 325 MG tablet, Take 2 tablets (650 mg total) by mouth every 6 (six) hours as needed for mild pain (or Fever >/= 101)., Disp: , Rfl:    albuterol  (VENTOLIN  HFA) 108 (90 Base) MCG/ACT inhaler, Inhale 2 puffs into the lungs every 6 (six) hours as needed for wheezing or shortness of breath., Disp: 18 each, Rfl: 2   aspirin  EC 81 MG tablet, Take 1 tablet (81 mg total) by mouth daily. Swallow whole., Disp: 90 tablet, Rfl: 3   atorvastatin  (LIPITOR) 10 MG tablet, TAKE 1 TABLET BY MOUTH EVERY DAY, Disp: 90 tablet, Rfl: 1   b complex vitamins capsule, Take 1 capsule by mouth daily. With C, Disp: , Rfl:    budesonide   (PULMICORT ) 0.5 MG/2ML nebulizer solution, Take 2 mLs (0.5 mg total) by nebulization 2 (two) times daily. DX:J45.40, Disp: 120 mL, Rfl: 12   buPROPion  (WELLBUTRIN  XL) 150 MG 24 hr tablet, TAKE 1 TABLET BY MOUTH EVERY DAY, Disp: 90 tablet, Rfl: 1   cholecalciferol (VITAMIN D3) 25 MCG (1000 UNIT) tablet, Take 1,000 Units by mouth daily., Disp: , Rfl:    INGREZZA  80 MG capsule, Take 1 capsule (80 mg total) by mouth daily., Disp: 30 capsule, Rfl: 5   ipratropium-albuterol  (DUONEB) 0.5-2.5 (3) MG/3ML SOLN, Take 3 mLs by nebulization 3 (three) times daily as needed. 1 treatment in the morning, 1 treatment in the evenings, every 6 hours as needed in between for shortness of breath or wheezing DX: J45.40, Disp: 270 mL, Rfl: 12   meclizine  (ANTIVERT ) 12.5 MG tablet, Take 1 tablet (12.5 mg total) by mouth 3 (three) times daily as needed for dizziness., Disp: 30 tablet, Rfl: 2   metoprolol  succinate (TOPROL -XL) 25 MG 24 hr tablet, Take 1 tablet (25 mg total) by mouth daily. Take with or immediately following a meal., Disp: 90 tablet, Rfl: 3   mirtazapine  (REMERON ) 7.5 MG tablet, Take 0.5 tablets (3.75 mg total) by mouth at bedtime., Disp: 45 tablet, Rfl: 1   Omega-3 Fatty Acids (FISH OIL BURP-LESS PO), Take 700 mg by mouth daily., Disp: , Rfl:    omeprazole  (PRILOSEC) 20 MG capsule, Take 1 capsule (20 mg total) by mouth 2 (two) times daily before a meal., Disp: 14 capsule, Rfl: 0   polyethylene glycol (MIRALAX  / GLYCOLAX ) 17 g packet, Take 17 g by mouth daily., Disp: 30 each, Rfl: 2   propranolol  (INDERAL ) 10 MG tablet, TAKE 1 TABLET BY MOUTH THREE TIMES A DAY AS NEEDED, Disp: 270 tablet, Rfl: 2 [2] No Known Allergies [3]  Social History Tobacco Use  Smoking Status Never  Smokeless Tobacco Never

## 2024-11-12 NOTE — Assessment & Plan Note (Signed)
 Orders:    Hemoglobin A1c

## 2024-11-12 NOTE — Assessment & Plan Note (Signed)
 [name]

## 2024-11-13 ENCOUNTER — Other Ambulatory Visit: Payer: Self-pay | Admitting: Physician Assistant

## 2024-11-13 ENCOUNTER — Encounter (HOSPITAL_COMMUNITY)

## 2024-11-13 ENCOUNTER — Encounter: Payer: Self-pay | Admitting: Nurse Practitioner

## 2024-11-13 ENCOUNTER — Ambulatory Visit: Payer: Self-pay | Admitting: Nurse Practitioner

## 2024-11-13 VITALS — BP 120/60 | HR 65 | Temp 99.0°F | Ht 69.0 in | Wt 136.8 lb

## 2024-11-13 DIAGNOSIS — N183 Chronic kidney disease, stage 3 unspecified: Secondary | ICD-10-CM

## 2024-11-13 DIAGNOSIS — Z8659 Personal history of other mental and behavioral disorders: Secondary | ICD-10-CM

## 2024-11-13 DIAGNOSIS — R7309 Other abnormal glucose: Secondary | ICD-10-CM

## 2024-11-13 DIAGNOSIS — N3944 Nocturnal enuresis: Secondary | ICD-10-CM

## 2024-11-13 DIAGNOSIS — G8191 Hemiplegia, unspecified affecting right dominant side: Secondary | ICD-10-CM

## 2024-11-13 DIAGNOSIS — Z8542 Personal history of malignant neoplasm of other parts of uterus: Secondary | ICD-10-CM

## 2024-11-13 DIAGNOSIS — Z79899 Other long term (current) drug therapy: Secondary | ICD-10-CM

## 2024-11-13 DIAGNOSIS — J849 Interstitial pulmonary disease, unspecified: Secondary | ICD-10-CM | POA: Insufficient documentation

## 2024-11-13 DIAGNOSIS — Z0000 Encounter for general adult medical examination without abnormal findings: Secondary | ICD-10-CM

## 2024-11-13 DIAGNOSIS — I700 Atherosclerosis of aorta: Secondary | ICD-10-CM

## 2024-11-13 DIAGNOSIS — F3161 Bipolar disorder, current episode mixed, mild: Secondary | ICD-10-CM

## 2024-11-13 DIAGNOSIS — I10 Essential (primary) hypertension: Secondary | ICD-10-CM

## 2024-11-13 DIAGNOSIS — H6123 Impacted cerumen, bilateral: Secondary | ICD-10-CM

## 2024-11-13 DIAGNOSIS — E7800 Pure hypercholesterolemia, unspecified: Secondary | ICD-10-CM

## 2024-11-13 LAB — POCT URINALYSIS DIP (CLINITEK)
Bilirubin, UA: NEGATIVE
Blood, UA: NEGATIVE
Glucose, UA: NEGATIVE mg/dL
Ketones, POC UA: NEGATIVE mg/dL
Leukocytes, UA: NEGATIVE
Nitrite, UA: NEGATIVE
POC PROTEIN,UA: NEGATIVE
Spec Grav, UA: 1.015
Urobilinogen, UA: 0.2 U/dL
pH, UA: 6

## 2024-11-13 NOTE — Assessment & Plan Note (Signed)
 Judy [name]

## 2024-11-13 NOTE — Assessment & Plan Note (Signed)
 [name]

## 2024-11-13 NOTE — Assessment & Plan Note (Signed)
 Orders:    Ambulatory referral to Home Health

## 2024-11-14 LAB — LIPID PANEL
Chol/HDL Ratio: 2.7 ratio (ref 0.0–4.4)
Cholesterol, Total: 222 mg/dL — ABNORMAL HIGH (ref 100–199)
HDL: 82 mg/dL
LDL Chol Calc (NIH): 122 mg/dL — ABNORMAL HIGH (ref 0–99)
Triglycerides: 102 mg/dL (ref 0–149)
VLDL Cholesterol Cal: 18 mg/dL (ref 5–40)

## 2024-11-14 LAB — CBC WITH DIFFERENTIAL/PLATELET
Basophils Absolute: 0 10*3/uL (ref 0.0–0.2)
Basos: 0 %
EOS (ABSOLUTE): 0.1 10*3/uL (ref 0.0–0.4)
Eos: 1 %
Hematocrit: 44.9 % (ref 34.0–46.6)
Hemoglobin: 14.6 g/dL (ref 11.1–15.9)
Immature Grans (Abs): 0 10*3/uL (ref 0.0–0.1)
Immature Granulocytes: 0 %
Lymphocytes Absolute: 1.9 10*3/uL (ref 0.7–3.1)
Lymphs: 32 %
MCH: 30.4 pg (ref 26.6–33.0)
MCHC: 32.5 g/dL (ref 31.5–35.7)
MCV: 94 fL (ref 79–97)
Monocytes Absolute: 0.5 10*3/uL (ref 0.1–0.9)
Monocytes: 8 %
Neutrophils Absolute: 3.4 10*3/uL (ref 1.4–7.0)
Neutrophils: 59 %
Platelets: 279 10*3/uL (ref 150–450)
RBC: 4.8 x10E6/uL (ref 3.77–5.28)
RDW: 12.3 % (ref 11.7–15.4)
WBC: 5.8 10*3/uL (ref 3.4–10.8)

## 2024-11-14 LAB — CMP14+EGFR
ALT: 34 [IU]/L — ABNORMAL HIGH (ref 0–32)
AST: 33 [IU]/L (ref 0–40)
Albumin: 4.3 g/dL (ref 3.8–4.8)
Alkaline Phosphatase: 67 [IU]/L (ref 49–135)
BUN/Creatinine Ratio: 14 (ref 12–28)
BUN: 14 mg/dL (ref 8–27)
Bilirubin Total: 0.5 mg/dL (ref 0.0–1.2)
CO2: 24 mmol/L (ref 20–29)
Calcium: 10.1 mg/dL (ref 8.7–10.3)
Chloride: 104 mmol/L (ref 96–106)
Creatinine, Ser: 1.01 mg/dL — ABNORMAL HIGH (ref 0.57–1.00)
Globulin, Total: 2.9 g/dL (ref 1.5–4.5)
Glucose: 79 mg/dL (ref 70–99)
Potassium: 4 mmol/L (ref 3.5–5.2)
Sodium: 144 mmol/L (ref 134–144)
Total Protein: 7.2 g/dL (ref 6.0–8.5)
eGFR: 57 mL/min/{1.73_m2} — ABNORMAL LOW

## 2024-11-14 LAB — MICROALBUMIN / CREATININE URINE RATIO
Creatinine, Urine: 110.4 mg/dL
Microalb/Creat Ratio: 4 mg/g{creat} (ref 0–29)
Microalbumin, Urine: 4.1 ug/mL

## 2024-11-14 LAB — HEMOGLOBIN A1C
Est. average glucose Bld gHb Est-mCnc: 108 mg/dL
Hgb A1c MFr Bld: 5.4 % (ref 4.8–5.6)

## 2024-11-15 ENCOUNTER — Encounter (HOSPITAL_COMMUNITY)

## 2024-11-20 ENCOUNTER — Encounter (HOSPITAL_COMMUNITY)

## 2024-11-22 ENCOUNTER — Encounter (HOSPITAL_COMMUNITY)

## 2024-12-18 ENCOUNTER — Ambulatory Visit: Admitting: Pulmonary Disease

## 2024-12-20 ENCOUNTER — Ambulatory Visit: Admitting: Physician Assistant

## 2025-01-30 ENCOUNTER — Ambulatory Visit: Payer: Self-pay

## 2025-04-23 ENCOUNTER — Inpatient Hospital Stay: Admitting: Gynecologic Oncology

## 2025-05-13 ENCOUNTER — Ambulatory Visit: Payer: Self-pay | Admitting: Nurse Practitioner

## 2025-11-14 ENCOUNTER — Encounter: Payer: Self-pay | Admitting: Nurse Practitioner
